# Patient Record
Sex: Female | Born: 1940
Health system: Southern US, Community
[De-identification: ages and names within clinical notes are randomized; demographics above are authoritative.]

## PROBLEM LIST (undated history)

## (undated) DIAGNOSIS — G4710 Hypersomnia, unspecified: Secondary | ICD-10-CM

## (undated) DIAGNOSIS — B2790 Infectious mononucleosis, unspecified without complication: Secondary | ICD-10-CM

## (undated) DIAGNOSIS — I10 Essential (primary) hypertension: Secondary | ICD-10-CM

## (undated) DIAGNOSIS — F1120 Opioid dependence, uncomplicated: Secondary | ICD-10-CM

## (undated) DIAGNOSIS — E669 Obesity, unspecified: Secondary | ICD-10-CM

## (undated) DIAGNOSIS — I2510 Atherosclerotic heart disease of native coronary artery without angina pectoris: Secondary | ICD-10-CM

## (undated) DIAGNOSIS — G894 Chronic pain syndrome: Secondary | ICD-10-CM

## (undated) DIAGNOSIS — R109 Unspecified abdominal pain: Secondary | ICD-10-CM

## (undated) DIAGNOSIS — W19XXXA Unspecified fall, initial encounter: Secondary | ICD-10-CM

## (undated) DIAGNOSIS — I639 Cerebral infarction, unspecified: Secondary | ICD-10-CM

## (undated) DIAGNOSIS — G4719 Other hypersomnia: Secondary | ICD-10-CM

## (undated) DIAGNOSIS — M25519 Pain in unspecified shoulder: Secondary | ICD-10-CM

## (undated) DIAGNOSIS — E785 Hyperlipidemia, unspecified: Secondary | ICD-10-CM

## (undated) HISTORY — DX: Hypersomnia, unspecified: G47.10

## (undated) HISTORY — DX: Cerebral infarction, unspecified: I63.9

## (undated) HISTORY — DX: Atherosclerotic heart disease of native coronary artery without angina pectoris: I25.10

## (undated) HISTORY — PX: BACK SURGERY: SHX140

## (undated) HISTORY — DX: Other hypersomnia: G47.19

## (undated) HISTORY — DX: Chronic pain syndrome: G89.4

## (undated) HISTORY — DX: Unspecified fall, initial encounter: W19.XXXA

## (undated) HISTORY — DX: Unspecified abdominal pain: R10.9

## (undated) HISTORY — DX: Pain in unspecified shoulder: M25.519

## (undated) HISTORY — DX: Opioid dependence, uncomplicated: F11.20

## (undated) HISTORY — DX: Obesity, unspecified: E66.9

## (undated) HISTORY — PX: NEPHRECTOMY: SHX65

## (undated) HISTORY — DX: Hyperlipidemia, unspecified: E78.5

---

## 1986-11-25 DIAGNOSIS — B2790 Infectious mononucleosis, unspecified without complication: Secondary | ICD-10-CM

## 1986-11-25 HISTORY — DX: Infectious mononucleosis, unspecified without complication: B27.90

## 1999-01-16 ENCOUNTER — Encounter: Admission: RE | Admit: 1999-01-16 | Discharge: 1999-04-16 | Payer: Self-pay | Admitting: Family Medicine

## 1999-07-04 ENCOUNTER — Encounter: Payer: Self-pay | Admitting: Family Medicine

## 1999-07-04 ENCOUNTER — Ambulatory Visit (HOSPITAL_COMMUNITY): Admission: RE | Admit: 1999-07-04 | Discharge: 1999-07-04 | Payer: Self-pay | Admitting: Family Medicine

## 1999-08-31 ENCOUNTER — Ambulatory Visit (HOSPITAL_COMMUNITY): Admission: RE | Admit: 1999-08-31 | Discharge: 1999-08-31 | Payer: Self-pay | Admitting: Cardiology

## 2002-02-27 ENCOUNTER — Inpatient Hospital Stay (HOSPITAL_COMMUNITY): Admission: EM | Admit: 2002-02-27 | Discharge: 2002-03-02 | Payer: Self-pay

## 2002-03-01 HISTORY — PX: ANGIOPLASTY: SHX39

## 2002-03-11 ENCOUNTER — Encounter: Payer: Self-pay | Admitting: Cardiology

## 2002-03-11 ENCOUNTER — Inpatient Hospital Stay (HOSPITAL_COMMUNITY): Admission: EM | Admit: 2002-03-11 | Discharge: 2002-03-12 | Payer: Self-pay | Admitting: Emergency Medicine

## 2002-03-11 ENCOUNTER — Encounter: Payer: Self-pay | Admitting: Emergency Medicine

## 2003-02-09 ENCOUNTER — Emergency Department (HOSPITAL_COMMUNITY): Admission: EM | Admit: 2003-02-09 | Discharge: 2003-02-09 | Payer: Self-pay

## 2003-03-30 ENCOUNTER — Encounter: Admission: RE | Admit: 2003-03-30 | Discharge: 2003-06-28 | Payer: Self-pay | Admitting: Family Medicine

## 2003-07-06 ENCOUNTER — Encounter: Admission: RE | Admit: 2003-07-06 | Discharge: 2003-10-04 | Payer: Self-pay | Admitting: Family Medicine

## 2004-02-17 ENCOUNTER — Emergency Department (HOSPITAL_COMMUNITY): Admission: EM | Admit: 2004-02-17 | Discharge: 2004-02-17 | Payer: Self-pay | Admitting: *Deleted

## 2004-08-17 ENCOUNTER — Ambulatory Visit (HOSPITAL_COMMUNITY): Admission: RE | Admit: 2004-08-17 | Discharge: 2004-08-17 | Payer: Self-pay | Admitting: Neurology

## 2005-03-15 ENCOUNTER — Observation Stay (HOSPITAL_COMMUNITY): Admission: EM | Admit: 2005-03-15 | Discharge: 2005-03-16 | Payer: Self-pay | Admitting: Emergency Medicine

## 2005-03-15 HISTORY — PX: CORONARY STENT PLACEMENT: SHX1402

## 2007-01-23 ENCOUNTER — Inpatient Hospital Stay (HOSPITAL_COMMUNITY): Admission: EM | Admit: 2007-01-23 | Discharge: 2007-01-27 | Payer: Self-pay | Admitting: Emergency Medicine

## 2007-01-26 HISTORY — PX: CARDIAC CATHETERIZATION: SHX172

## 2008-09-03 ENCOUNTER — Emergency Department (HOSPITAL_COMMUNITY): Admission: EM | Admit: 2008-09-03 | Discharge: 2008-09-03 | Payer: Self-pay | Admitting: Emergency Medicine

## 2009-10-22 ENCOUNTER — Inpatient Hospital Stay (HOSPITAL_COMMUNITY): Admission: EM | Admit: 2009-10-22 | Discharge: 2009-10-23 | Payer: Self-pay | Admitting: Emergency Medicine

## 2009-10-23 HISTORY — PX: CARDIAC CATHETERIZATION: SHX172

## 2010-11-20 ENCOUNTER — Inpatient Hospital Stay (HOSPITAL_COMMUNITY)
Admission: EM | Admit: 2010-11-20 | Discharge: 2010-11-21 | Payer: Self-pay | Source: Home / Self Care | Attending: Internal Medicine | Admitting: Internal Medicine

## 2011-02-04 LAB — CBC
HCT: 37 % (ref 36.0–46.0)
HCT: 37.4 % (ref 36.0–46.0)
MCH: 28.7 pg (ref 26.0–34.0)
MCHC: 34.2 g/dL (ref 30.0–36.0)
Platelets: 231 10*3/uL (ref 150–400)
Platelets: 248 10*3/uL (ref 150–400)
RBC: 4.25 MIL/uL (ref 3.87–5.11)
RDW: 12.6 % (ref 11.5–15.5)
RDW: 13 % (ref 11.5–15.5)
WBC: 6 10*3/uL (ref 4.0–10.5)
WBC: 6.6 10*3/uL (ref 4.0–10.5)

## 2011-02-04 LAB — URINALYSIS, ROUTINE W REFLEX MICROSCOPIC
Ketones, ur: NEGATIVE mg/dL
Leukocytes, UA: NEGATIVE
Nitrite: NEGATIVE
Protein, ur: NEGATIVE mg/dL
pH: 6 (ref 5.0–8.0)

## 2011-02-04 LAB — POCT CARDIAC MARKERS
CKMB, poc: <1 ng/mL — ABNORMAL LOW (ref 1.0–8.0)
Troponin i, poc: <0.05 ng/mL (ref 0.00–0.09)

## 2011-02-04 LAB — LIPID PANEL
HDL: 56 mg/dL (ref >39)
Total CHOL/HDL Ratio: 4.6 RATIO
VLDL: 25 mg/dL (ref 0–40)

## 2011-02-04 LAB — TROPONIN I: Troponin I: 0.02 ng/mL (ref 0.00–0.06)

## 2011-02-04 LAB — LIPASE, BLOOD: Lipase: 37 U/L (ref 11–59)

## 2011-02-04 LAB — CARDIAC PANEL(CRET KIN+CKTOT+MB+TROPI)
Relative Index: INVALID (ref 0.0–2.5)
Total CK: 31 U/L (ref 7–177)
Troponin I: 0.01 ng/mL (ref 0.00–0.06)
Troponin I: 0.03 ng/mL (ref 0.00–0.06)

## 2011-02-04 LAB — COMPREHENSIVE METABOLIC PANEL
AST: 20 U/L (ref 0–37)
Alkaline Phosphatase: 73 U/L (ref 39–117)
BUN: 19 mg/dL (ref 6–23)
CO2: 24 mEq/L (ref 19–32)
GFR calc Af Amer: >60 mL/min (ref >60)
GFR calc non Af Amer: 50 mL/min — ABNORMAL LOW (ref >60)
Total Protein: 6 g/dL (ref 6.0–8.3)

## 2011-02-04 LAB — APTT: aPTT: 28 seconds (ref 24–37)

## 2011-02-04 LAB — PROTIME-INR: INR: 0.89 (ref 0.00–1.49)

## 2011-02-04 LAB — GLUCOSE, CAPILLARY
Glucose-Capillary: 190 mg/dL — ABNORMAL HIGH (ref 70–99)
Glucose-Capillary: 99 mg/dL (ref 70–99)

## 2011-02-04 LAB — BASIC METABOLIC PANEL
Chloride: 105 mEq/L (ref 96–112)
GFR calc Af Amer: >60 mL/min (ref >60)
Glucose, Bld: 105 mg/dL — ABNORMAL HIGH (ref 70–99)

## 2011-02-04 LAB — URINE MICROSCOPIC-ADD ON

## 2011-02-27 LAB — CARDIAC PANEL(CRET KIN+CKTOT+MB+TROPI)
CK, MB: 0.8 ng/mL (ref 0.3–4.0)
CK, MB: 0.9 ng/mL (ref 0.3–4.0)
Total CK: 41 U/L (ref 7–177)
Total CK: 63 U/L (ref 7–177)

## 2011-02-27 LAB — POCT CARDIAC MARKERS
CKMB, poc: <1 ng/mL — ABNORMAL LOW (ref 1.0–8.0)
CKMB, poc: <1 ng/mL — ABNORMAL LOW (ref 1.0–8.0)
Troponin i, poc: 0.05 ng/mL (ref 0.00–0.09)
Troponin i, poc: <0.05 ng/mL (ref 0.00–0.09)

## 2011-02-27 LAB — GLUCOSE, CAPILLARY: Glucose-Capillary: 86 mg/dL (ref 70–99)

## 2011-02-27 LAB — CBC
HCT: 36.3 % (ref 36.0–46.0)
Hemoglobin: 12.8 g/dL (ref 12.0–15.0)
MCV: 91.3 fL (ref 78.0–100.0)
Platelets: 203 10*3/uL (ref 150–400)
RBC: 4.06 MIL/uL (ref 3.87–5.11)
RDW: 12.9 % (ref 11.5–15.5)
WBC: 5 10*3/uL (ref 4.0–10.5)
WBC: 6.4 10*3/uL (ref 4.0–10.5)

## 2011-02-27 LAB — COMPREHENSIVE METABOLIC PANEL
ALT: 20 U/L (ref 0–35)
Alkaline Phosphatase: 77 U/L (ref 39–117)
CO2: 24 mEq/L (ref 19–32)
Calcium: 9 mg/dL (ref 8.4–10.5)
Chloride: 107 mEq/L (ref 96–112)
GFR calc non Af Amer: >60 mL/min (ref >60)
Glucose, Bld: 94 mg/dL (ref 70–99)
Sodium: 139 mEq/L (ref 135–145)
Total Bilirubin: 0.6 mg/dL (ref 0.3–1.2)

## 2011-02-27 LAB — DIFFERENTIAL
Lymphocytes Relative: 42 % (ref 12–46)
Monocytes Absolute: 0.6 10*3/uL (ref 0.1–1.0)
Monocytes Relative: 11 % (ref 3–12)
Neutro Abs: 2.2 10*3/uL (ref 1.7–7.7)
Neutrophils Relative %: 44 % (ref 43–77)

## 2011-02-27 LAB — LIPID PANEL
HDL: 66 mg/dL (ref >39)
LDL Cholesterol: 154 mg/dL — ABNORMAL HIGH (ref 0–99)
Triglycerides: 105 mg/dL (ref <150)

## 2011-02-27 LAB — BRAIN NATRIURETIC PEPTIDE: Pro B Natriuretic peptide (BNP): 165 pg/mL — ABNORMAL HIGH (ref 0.0–100.0)

## 2011-02-27 LAB — HEPARIN LEVEL (UNFRACTIONATED): Heparin Unfractionated: 0.37 IU/mL (ref 0.30–0.70)

## 2011-02-27 LAB — CK TOTAL AND CKMB (NOT AT ARMC): Relative Index: INVALID (ref 0.0–2.5)

## 2011-02-27 LAB — HEMOGLOBIN A1C: Hgb A1c MFr Bld: 6.1 % (ref 4.6–6.1)

## 2011-02-27 LAB — TROPONIN I: Troponin I: <0.01 ng/mL (ref 0.00–0.06)

## 2011-02-27 LAB — POCT I-STAT, CHEM 8
BUN: 18 mg/dL (ref 6–23)
Calcium, Ion: 1.2 mmol/L (ref 1.12–1.32)
Chloride: 105 mEq/L (ref 96–112)
Glucose, Bld: 125 mg/dL — ABNORMAL HIGH (ref 70–99)
HCT: 38 % (ref 36.0–46.0)
Potassium: 4.1 mEq/L (ref 3.5–5.1)

## 2011-02-27 LAB — TSH: TSH: 2.615 u[IU]/mL (ref 0.350–4.500)

## 2011-04-12 NOTE — Discharge Summary (Signed)
[place]  Patient:    [name], [name] Visit Number: [id number] MRN: [id number]          Service Type: MED Location: [id number] Attending Physician:  Ophelia Shoulder Dictated by:   Marya Fossa, P.A. Admit Date:  02/27/2002 Disc. Date: 03/02/02                             Discharge Summary  DATE OF BIRTH:  [birth year].  ADMISSION DIAGNOSES: 1. Chest pain, rule out myocardial infarction. 2. Hypertension. 3. History of coronary artery disease. 4. Elevated triglycerides. 5. Chronic fatigue syndrome.  DISCHARGE DIAGNOSES: 1. Chest pain resolved, myocardial infarction ruled out with negative    enzymes.  Status post cardiac catheterization with cutting balloon    percutaneous transluminal coronary angioplasty mid right coronary artery. 2. Hypertension. 3. History of coronary artery disease. 4. Elevated triglycerides. 5. Chronic fatigue syndrome.  HISTORY OF PRESENT ILLNESS:  The patient is a 70 year old white female patient of Madaline Savage, M.D., with a history of chronic fatigue syndrome, hypertension, and coronary artery disease.  She also has elevated triglycerides.  On and off for the last three weeks, she has had to take nitroglycerin for recurrent chest pain.  The pain occurs with and without exertion, lasting a few seconds to 20 or 30 minutes.  At times, the pain is worse with deep breathing. Sometimes the pain radiates to the neck.  She has associated shortness of breath but no PND or orthopnea.  The patient had a stress test and work-up in [place], [place], on August 12, 2001, with a cardiologist there which apparently was negative for ischemia.  However, given recurrent chest pain, we will proceed with heart catheterization to delineate coronary anatomy.  The risks and benefits of the procedure have been reviewed and the patient is willing to proceed.  The patient will be treated with Lovenox  per unstable angina protocol and nitropaste.  Will check cardiac enzymes.  PROCEDURES:  Cardiac catheterization by Dr. Chanda Busing on March 01, 2002.  COMPLICATIONS:  None.  CONSULTATIONS:  None.  HOSPITAL COURSE:  The patient was admitted to [facility] February 27, 2002, by Delrae Rend, M.D. She was started on the Lovenox protocol for unstable angina and nitropaste.  Cardiac enzymes were negative. EKG remained nonacute.  Admission CBC was within normal limits as was her BNP with the exception of a mildly elevated BUN at 25. Creatinine 0.9.  The patient remained stable over the weekend with no significant chest pain.  On March 01, 2002, the patient was taken to the cardiac catheterization lab by Dr. Elsie Lincoln.  This revealed a normal left main, LAD widely patent, circumflex widely patent, RCA with a 75% hypodense lesion in the mid portion.  EF 70% with no MR.  Dr. Elsie Lincoln proceeded with cutting balloon intervention of the RCA lesion with a 2.25 x 16 balloon, reducing a 75% lesion to 0%.  Integrelin was bolused and infused.  The patient tolerated the procedure well and had no problems with sheath pull.  On March 02, 2002, the patient continued to be stable and had no recurrent chest pain.  Dr. Elsie Lincoln felt the patient was stable for discharge to home. She had __________ bruising of the right groin but no hematoma.  Hemoglobin 13. BUN 10, creatinine 0.8.  Total cholesterol 286, HDL 77, LDL 153, and triglycerides 299.  The patient  will be started on statin.  DISCHARGE MEDICATIONS:  1. Lipitor 10 mg a day (new).  2. Aspirin 325 mg a day.  3. __________ 300 mg a day.  4. Synthroid 0.10 mg a day.  5. Hydrochlorothiazide 25 mg a day.  6. Fluoxetine 20 mg b.i.d.  7. Allegra 60 mg b.i.d.  8. Potassium 10 mEq b.i.d.  9. Nitroglycerin as needed for chest pain. 10. Atenolol 25 mg b.i.d. as needed for rapid heart rate. 11. Darvocet and diazepam as needed and as  directed.  ACTIVITY:  No strenuous activity, lifting more than five pounds or driving for two days.  DIET:  Low fat, low cholesterol, low salt diet.  WOUND CARE:  She may shower.  DISCHARGE INSTRUCTIONS:  She is asked to call the office for any problems or questions.  FOLLOW-UP:  She should keep her appointment with Dr. Elsie Lincoln for March 11, 2002. Dictated by:   Marya Fossa, P.A. Attending Physician:  Ophelia Shoulder DD:  03/02/02 TD:  03/02/02 Job: [id number]

## 2011-04-12 NOTE — Discharge Summary (Signed)
NAMEMARIYAH, [name]               ACCOUNT NO.:  [id number]   MEDICAL RECORD NO.:  [id number]          PATIENT TYPE:  INP   LOCATION:  3729                         FACILITY:  [facility]   PHYSICIAN:  Madaline Savage, M.D.DATE OF BIRTH:  [birth year]   DATE OF ADMISSION:  01/23/2007  DATE OF DISCHARGE:  01/27/2007                               DISCHARGE SUMMARY   DISCHARGE DIAGNOSES:  1. Chest pain, worrisome for unstable angina. Catheterization this      admission revealing no critical progression.  Plan is for continued      medical therapy.  2. Dyslipidemia, with suboptimal control.  3. Obesity.  4. Known coronary disease, with multiple interventions in the past.  5. New onset non-insulin-dependent diabetes, with hemoglobin A1c of      6.6, and mildly elevated glucose levels on this admission.  6. History of chronic fatigue syndrome.  7. Known coronary disease, with multiple interventions in the past.      Her last intervention was in April 2003.   HOSPITAL COURSE:  [name] is a 70 year old female who lives in  [place], [place], who is followed by Dr. Elsie Lincoln for about 20  years.  She has a long history of coronary disease and has had multiple  interventions.  Her last intervention was in April 2006.  She had an RCA  stent placed with a Mini Vision stent.  She was admitted through the  emergency room on January 23, 2007 with unstable angina.  She was  admitted to telemetry, started on heparin and nitrates, and set up for  diagnostic catheterization.  This was done January 26, 2007 by Dr. Elsie Lincoln.  The patient's enzymes were negative.  Catheterization did reveal  moderate coronary disease, with a 95% marginal branch, 75% small LAD,  60% diagonal, 60% circumflex, and patent distal RCA stent, with an EF of  70%.  Plan is for continued medical therapy.  We added an ACE inhibitor  and a nitrate.  During her hospitalization, hemoglobin A1c was checked  and was elevated, and her  sugars were also slightly elevated.  She is  obese, and we suspect she has metabolic syndrome and possibly insulin-  dependent diabetes.  Her lipids were also evaluated, and her LDL was  significantly elevated at 218, with an HDL of 42, and triglycerides of  237.  Her TSH is normal.  We wanted to increase her statin therapy and  possibly started her on an oral hypoglycemic, but the patient wants to  address this with diet and lifestyle changes.  Dr. Clarene Duke, who saw her  at discharge, agreed to do this for a couple months, and then we will  reassess her, checking her lipids, hemoglobin A1c, fasting blood sugar,  and seeing if she has managed to achieve any weight loss.  We feel the  patient can go home and follow up with Dr. Elsie Lincoln as an outpatient.   DISCHARGE MEDICATIONS:  1. Coated aspirin 81 mg a day.  2. Vytorin 10/40 daily.  3. Atenolol 50 mg at bedtime.  4. HCTZ 25 mg a day.  5. Synthroid  0.05 mg a day.  6. Allegra 180 mg a day.  7. Ritalin 20 mg a day.  8. Lisinopril 5 mg a day.  9. Imdur 30 mg a day.  10.Nitroglycerin sublingual p.r.n.  11.Prilosec OTC.  12.Prometrium daily.   LABORATORIES:  White count 7, hemoglobin 12.9, hematocrit 37.3,  platelets 256.  Sodium 140, potassium 3.8, BUN 17, creatinine 1.1.  Hemoglobin A1c is 6.6.  Lipids are noted above.  Cholesterol is 307,  triglycerides 237, HDL 42, LDL 218.  TSH is normal at 1.44.  Liver  functions are normal.  CK-MB and troponins were negative.  Amylase was  normal,  lipase normal Chest x-ray showed no acute findings.   DISPOSITION:  The patient is discharged in stable condition and will  follow up with Dr. Elsie Lincoln in a couple weeks in the office.      Abelino Derrick, P.A.    ______________________________  Madaline Savage, M.D.    Lenard Lance  D:  01/27/2007  T:  01/27/2007  Job:  [id number]

## 2011-04-12 NOTE — Discharge Summary (Signed)
[place]  Patient:    [name], [name] Visit Number: [id number] MRN: [id number]          Service Type: MED Location: 2000 2009 01 Attending Physician:  Ophelia Shoulder Dictated by:   Darcella Gasman. In[place], F.N.P.C. Admit Date:  03/11/2002 Discharge Date: 03/12/2002   CC:         Kathleen Curt. Chilton Si, MD  Cardiac Rehab   Discharge Summary  DISCHARGE DIAGNOSES: 1. Chest pain, noncardiac, probably chest wall pain secondary to #2. 2. Thoracic disk disease. 3. Chronic fatigue. 4. External otitis media. 5. Sinus bradycardia, normal for this patient. 6. Coronary artery disease with recent angioplasty. 7. Anxiety. 8. Gastroesophageal reflux disease. 9. Seasonal allergies.  CONDITION ON DISCHARGE:  Improved.  PROCEDURE:  None.  DISCHARGE MEDICATIONS:  1. Lipitor 10 mg as before.  2. Enteric-coated aspirin 325 mg as before.  3. Zantac 300 mg twice a day as before.  4. Synthroid as before.  5. Hydrochlorothiazide 25 mg daily as before.  6. Prozac 20 mg daily as before.  7. Allegra 60 mg as before.  8. Atenolol as before.  9. K-Dur as before. 10. For ear discomfort cortisporin otic suspension four drops right ear three     times a day for 10 days. 11. Darvocet-N 100 one or two every four hours as needed for pain.  DISCHARGE INSTRUCTIONS:  No strenuous activity for one week then resume regular activities.  Low fat, low salt diet.  See Dr. Abigail Miyamoto or Dr. [place] in follow-up of ear.  Follow up with Dr. Elsie Lincoln in six weeks, call for appointment.  HISTORY OF PRESENT ILLNESS:  A 70 year old white married female recent resident of [place], [place], who is moving to [place] currently, presented to the [facility] ER and admitted by Doug Sou, M.D. and Dr. Aleen Campi for rule out MI.  The patient had chest pain on presentation 10 to 12 days prior to this admission and she had a negative EKG and enzymes, but  cardiac catheterization revealed stenosis of her RCA and had cutting balloon angioplasty of the mid RCA.  Negative enzymes and good symptomatic result.  When the patient began to ambulate, she was short of breath, fatigued, complained of substernal chest pain.  She also had right ear pain, right neck pain, with radiation to her right sternum.  Some relief with sublingual nitroglycerin.  EKG was negative.  She was admitted to the rule out unit on IV nitroglycerin and Lovenox.  PAST MEDICAL HISTORY:  Very high anxiety level which is chronic.  Lumbar disk disease, status post disk surgery in 1987.  Thoracic disk disease with anterior radiation of pain.  Gastroesophageal reflux disease.  Hyperlipidemia. Seasonal allergies.  Chronic fatigue syndrome, status post right nephrectomy, treated hypothyroidism, and partial hysterectomy.  SOCIAL HISTORY: FAMILY HISTORY: REVIEW OF SYSTEMS:  See H&P.  DISCHARGE PHYSICAL EXAMINATION:  VITAL SIGNS: Blood pressure 115/47, pulse 79, respirations 20, temperature 97.3, oxygen saturation 90%.  GENERAL: Alert and oriented white female.  LUNGS: Clear.  HEART: S1 and S2 regular rate and rhythm.  EXTREMITIES: Without edema.  Ears; right TM was stable.  Good light reflex.  Mildly erythemic canal and swelling.  Left TM within normal limits. Nontender mastoids.  No cervical or submandible adenopathy.  LABORATORY DATA:  Admission hemoglobin 12.7, hematocrit 35.5, WBC 5.9, MCV 88, platelets 253, neutrophils 40, lymphs 47, monos 9, eos 3, basos 2.  Sodium 143, potassium 3.9, chloride 106, CO2 30, glucose  107, BUN 20, creatinine 0.9, and calcium 9.4.  Cardiac enzymes; CK ranged 58, 55, 53; MBs 0.8, 0.9, 1.0; troponins 0.02 to 0.01, all negative for MI.  Lipid panel; cholesterol 202, triglycerides 214, HDL 69, LDL 90.  Chest x-ray; lungs are clear, heart and mediastinal structures are normal, no active disease.  Persantine Cardiolite; stress portion was normal.   Nuclear results; no induced myocardial ischemia.  EF was 71%.  Cardiograms; These were all done on April 17; sinus rhythm, no significant change from previous tracing.  HOSPITAL COURSE:  [name] was admitted as stated on March 11, 2002, to the rule out MI unit.  Dr. Elsie Lincoln saw her the morning of April 17, and discontinued her nitroglycerin and Lovenox.  She underwent Persantine Cardiolite which was negative for ischemia.  By March 12, 2002, her greatest complaint was her earache and that morphine helped her get a good nights sleep.  She had no further chest pain. She was stable and ready for discharge home.  Chest pain was felt to be chest wall pain.  She was discharged and would follow up with Dr. Elsie Lincoln as an outpatient and will establish primary care here in [facility] and resume cardiac rehabilitation. Dictated by:   Darcella Gasman Ingold, F.N.P.C. Attending Physician:  Ophelia Shoulder DD:  04/04/02 TD:  04/06/02 Job: [id number]

## 2011-04-12 NOTE — Cardiovascular Report (Signed)
[name], [name]               ACCOUNT NO.:  [id number]   MEDICAL RECORD NO.:  [id number]          PATIENT TYPE:  INP   LOCATION:  3729                         FACILITY:  [facility]   PHYSICIAN:  Madaline Savage, M.D.DATE OF BIRTH:  [birth year]   DATE OF PROCEDURE:  01/26/2007  DATE OF DISCHARGE:                            CARDIAC CATHETERIZATION   PROCEDURES PERFORMED:  1. Selective coronary angiography by Judkins technique.  2. Retrograde left heart catheterization.  3. Left ventricular angiography.   ENTRY SITE:  Right femoral.   DYE USED:  Used Omnipaque.   CATHETERS USED:  A 6-French Judkins.   COMPLICATIONS:  None.   PATIENT PROFILE:  [name] is a 70 year old, white, married female,  resident of [name], [place] who I have followed as a  cardiology patient for close to 20 years.  Recently she has had chest  pain with exertion and with rest, and because of known coronary disease  in the past decided to come back to [facility] for further evaluation  and treatment.  Cardiac enzymes and EKGs prior to cath lab entry have  proven negative for myocardial infarction.  This case was completed  electively without any complications.   RESULTS:   PRESSURES:  The patient's left ventricular pressure was 145/8, end-  diastolic pressure 21.  Central aortic pressure 140/60, mean of 93.  No  aortic valve gradient by pullback technique.   ANGIOGRAPHIC RESULTS:  The patient's left main coronary artery is very  short.  It was not diseased.  LAD coursed to the cardiac apex and it  gave rise to both an optional diagonal branch arising proximally which  was a very large vessel, almost as large as the LAD itself, and a  smaller second diagonal branch.  The LAD contained a 60% stenosis just  after diagonal branch #2.  It was focal and hazy and the vessel was less  than 2-mm in diameter.   Diagonal branch #2 was 75% stenosed proximally, may be the culprit  lesion responsible  for the patient's current symptoms.  This vessel is  between 1.5 and 1.75-mm in diameter and possibly 40-mm in length.  I  think it is best treated medically.  I do not envision ballooning or  stenting as an option for this vessel.   The optional diagonal branch of the LAD contained luminal irregularities  throughout its course.  It was a small to medium size vessel that was  long in length and no lesions were seen.   The circumflex was a long vessel and showed a 60% eccentric stenosis in  the mid portion of this vessel.   The right coronary artery showed an acute marginal branch arising midway  down the vessel.  This mid portion of the acute marginal branch was 95%  stenosed as it was in 2003.   The distal RCA in the posterior descending portion contained a radio-  opaque stent that was patent throughout with runoff that was good, but  small in circumference because it was a small vessel.   Left ventricle showed normal contractility with ejection fraction of 70%  and no evidence of mitral regurgitation.   FINAL DIAGNOSES:  1. Mild diffuse coronary disease as described above.  2. Supernormal left ventricular systolic function.   PLAN:  Continued medical therapy.  The patient was recently diagnosed as  being diabetic.  She will need to begin an exercise program gradually,  change her dietary habits, lose weight, and follow up with her primary  caregiver in [name], [place], and will probably need at least  yearly stress tests.   The patient's hemoglobin A1c is elevated.  Her lipid study show that her  LDL cholesterol is 230, and triglycerides were extremely high.  Intensification of her medical therapy will become necessary.           ______________________________  Madaline Savage, M.D.     WHG/MEDQ  D:  01/26/2007  T:  01/26/2007  Job:  [id number]   cc:   [facility]  [facility], Medical Records

## 2011-04-12 NOTE — H&P (Signed)
NAMELOXLEY, CIBRIAN NO.:  [id number]   MEDICAL RECORD NO.:  [id number]          PATIENT TYPE:  EMS   LOCATION:  MAJO                         FACILITY:  [facility]   PHYSICIAN:  Ulyses Amor, MD DATE OF BIRTH:  02-Apr-1941   DATE OF ADMISSION:  03/15/2005  DATE OF DISCHARGE:                                HISTORY & PHYSICAL   [name] is a 70 year old white woman who was admitted to [facility] for further evaluation of chest pain.   The patient has an extensive history of coronary artery disease which dates  back to 65.  During these intervening 19 years, she has undergone a total  of seven percutaneous coronary interventions to a variety of coronary  vessels including the LAD, diagonal, and right coronary artery.  Her last  intervention was a cutting balloon angioplasty of the right coronary artery  in 2003.  There is no history of myocardial infarction, congestive heart  failure, or arrhythmia.   The patient presented to the emergency department with a three-day history  of chest pain.  The chest pain has been generally continuous and not  intermittent over this period of time.  The chest pain is described as a  pressure in the epigastrium.  It radiates somewhat to the midepigastrium.  There are no exacerbating or ameliorating factors.  It appears not to be  related to position, activity, meals, or respirations.  There is no  associated dyspnea, diaphoresis, or nausea.  She has not taken  nitroglycerin.  Again, it has been present for this three-day period  continuously at a low-grade level.   The patient believes that this chest pain is different from her previous  cardiac chest pain.   In addition to the aforementioned problems, the patient has a history of  hypertension; dyslipidemia; hypothyroidism; gastroesophageal reflux; and  chronic fatigue syndrome.   MEDICATIONS:  1.  Darvocet-N 100.  2.  Effexor.  3.  Allegra.  4.   Synthroid.  5.  Hydrochlorothiazide.  6.  Concerta ER.  7.  Prometrium.   ALLERGIES:  1.  CODEINE.  2.  DEMEROL.  3.  ERYTHROMYCIN.   The patient lives alone; she is married, but her husband works in another  part of the state.  She does not work.  She neither smokes nor drinks.   FAMILY HISTORY:  Noncontributory.   OPERATIONS:  1.  Nephrectomy due to atrophic right kidney.  2.  Spinal stenosis surgery.  3.  Partial hysterectomy.   REVIEW OF SYSTEMS:  Reveals no new problems related to her head, eyes, ears,  nose, mouth, throat, lungs, gastrointestinal system, genitourinary system,  or extremities.  There is no history of fever, chills, or weight loss.   PHYSICAL EXAMINATION:  VITAL SIGNS:  Blood pressure 145/57; pulse 82 and  regular; respirations 20; temperature 98.3.  GENERAL:  The patient was a middle-aged white woman in no discomfort.  She  was alert, oriented, appropriate, and responsive.  HEAD, EYES, , NOSE, AND MOUTH:  Normal.  NECK:  Without thyromegaly or adenopathy.  Carotid pulses were palpable  bilaterally and without bruits.  CARDIAC:  Revealed a normal S1 and S2.  There was no S3, S4, murmur, rub, or  click.  Cardiac rhythm was regular.  No chest wall tenderness was noted.  LUNGS:  Clear.  ABDOMEN:  Soft and nontender.  There was no mass, hepatosplenomegaly, bruit,  distention, rebound, guarding, or rigidity.  Bowel sounds were normal.  BREAST/PELVIC/RECTAL:  Were not performed, as they were not pertinent to the  reason for acute care hospitalization.  EXTREMITIES:  Without edema, deviation, or deformity.  Radial and dorsalis  pedal pulses were palpable bilaterally.  NEUROLOGIC:  Brief screening neurologic survey was unremarkable.   LABORATORIES:  The electrocardiogram was normal, with the exception of a  slightly prolonged PR interval.  The chest radiograph has not yet been done  at the time of this dictation.  The initial set of cardiac markers revealed   a myoglobin of 58.2, CK-MB less than 1, and troponin less than 0.08.  All  other remaining blood work was pending at the time of this dictation.   IMPRESSION:  1.  Chest pain.  Rule out unstable angina.  The patient's pain is atypical      in quality.  2.  Coronary artery disease.  Status post seven percutaneous coronary      interventions on a variety of vessels (LAD, diagonal, and right coronary      artery) since 1987.  3.  Hypertension.  4.  Dyslipidemia.  5.  Hypothyroidism.  6.  Gastroesophageal reflux.  7.  Chronic fatigue syndrome.   PLAN:  1.  Telemetry.  2.  Serial cardiac enzymes.  3.  Aspirin.  4.  Intravenous heparin.  5.  Intravenous nitroglycerin.  6.  Further measures per Dr. Elsie Lincoln.      MSC/MEDQ  D:  03/15/2005  T:  03/15/2005  Job:  [id number]   cc:   Ulyses Amor, MD  [address]  [place], [place] [address]  Fax: [phone]   Madaline Savage, M.D.  [phone] [address], [address]  [place]  [place] [address]  Fax: [phone]

## 2011-04-12 NOTE — Cardiovascular Report (Signed)
[place]  Patient:    CEOLA, PARA Visit Number: [id number] MRN: [id number]          Service Type: MED Location: [phone] Attending Physician:  Ophelia Shoulder Dictated by:   Madaline Savage, M.D. Proc. Date: 03/01/02 Admit Date:  02/27/2002   CC:         Cardiac Catheterization Laboratory   Cardiac Catheterization  PROCEDURES PERFORMED: 1. Intracoronary artery Cutting Balloon angioplasty of the mid    right coronary artery. 2. Selective coronary angiography by Judkins technique. 3. Left ventricular angiography. 4. Retrograde left heart catheterization.  COMPLICATIONS: None.  ENTRY SITE: Right femoral.  DYE USED: Omnipaque.  PATIENT PROFILE: The patient is a 70 year old, white married female who lives in [place], [place], who has had chest pain in the last three weeks that is substernal in nature, and responsive to sublingual nitroglycerin. She entered the emergency room yesterday with nitrate-responsive chest pain and has had subsequently negative cardiac enzymes and ECGs for myocardial infarction.  Past history shows that she had several LAD and diagonal angioplasties in 1987, and then had RCA interventions in the midportion of the vessel in the mid 1990s. Her last cardiac catheterization was performed August 31, 1999, at which time both her LAD, diagonal and right coronary arteries were all angiographically patent. She enters the catheterization lab on an inpatient basis to reassess coronary anatomy in view of her current symptoms.  RESULTS:  PRESSURES: The left ventricular pressure was 160/30. Central aortic pressure was 160/80 with a mean of 115. No aortic valve gradient by pullback technique.  ANGIOGRAPHIC RESULTS: The patients left main coronary artery appears normal.  The left anterior descending coronary artery is proximally calcified and fairly tortuous in the distal one-half of the runoff.  There is linear calcification noted in the proximal circumflex and the proximal LAD and in the first diagonal branch of the LAD. There is no obstructive coronary disease, however, including the old angioplasty sites.  The circumflex coronary artery gives rise to one main obtuse marginal branch and a bifurcating posterolateral branch distally. The proximal and mid circumflex appears normal.  The RCA is a 2.25 to 2.5 vessel. It is not calcified. There is an acute marginal branch, which arises relatively high along the mid RCA wall. There is a point of tortuosity followed by a hypodense lesion of 75% in the mid RCA well before the acute margin of the heart. The distal vessel appears normal. This 75% lesion is the only lesion seen. It represents a probable re-stenosis of an old angioplasty site.  The left ventricle shows vigorous contractility of all wall segments and a left ventricular ejection fraction between 65 and 70% with catheter-induced mitral regurgitation, which is not felt to be hemodynamically significant.  PERCUTANEOUS CORONARY INTERVENTION: This was performed using a 6 [place], right Judkins guide catheter with a 4 cm tip and side holes indwelling. The guide wire was a short Patriot wire and the only interventional device used during the procedure was a 2.25 x 6 mm Cutting Balloon device. The guide catheter provided excellent backup support and obviously no damping of pressure because of side holes. The guide wire easily crossed the lesion and was brought to rest in the PDA. The Cutting Balloon was driven distally beyond the lesion and then the lesion was visualized and the balloon was brought back to the lesions, centered and then inflated to 8 atmospheres for a minute, 15. The lesion was reduced from  75% to 0% residual. The patient did develop discomfort during the last 15-20 seconds of balloon inflation that replicated her pain almost exactly. The pain resolved within 20  seconds of balloon deflation. No ST segment changes occurred. The case was tolerated well without complications.  FINAL DIAGNOSES: 1. Two-vessel coronary artery disease, status post left anterior descending    and right coronary artery angioplasties in 1987 and in 1995. 2. Recent episodes of unstable nitrate-responsive chest pain. 3. Normal left ventricular systolic function, ejection fraction 65-70%. 4. Successful Cutting Balloon angioplasty of the mid right coronary artery    at an old angioplasty site with reduction of a 75% lesion to 0% residual. Dictated by:   Madaline Savage, M.D. Attending Physician:  Ophelia Shoulder DD:  03/01/02 TD:  03/02/02 Job: [id number]

## 2011-04-12 NOTE — Cardiovascular Report (Signed)
[name], MURAMOTO               ACCOUNT NO.:  [id number]   MEDICAL RECORD NO.:  [id number]          PATIENT TYPE:  OBV   LOCATION:  6526                         FACILITY:  [facility]   PHYSICIAN:  Madaline Savage, M.D.DATE OF BIRTH:  1941/06/19   DATE OF PROCEDURE:  03/15/2005  DATE OF DISCHARGE:  03/16/2005                              CARDIAC CATHETERIZATION   PROCEDURES PERFORMED:  1.  Selective coronary angiography by Judkins technique.  2.  Retrograde left heart catheterization.  3.  Left ventricular angiography.  4.  Percutaneous coronary stenting of the distal right coronary artery.   COMPLICATIONS:  None.   ENTRY SITE:  Right femoral.   DYE USED:  Omnipaque.   PATIENT PROFILE:  The patient is a 70 year old white married female with a  history of seven previous cardiac catheterization and percutaneous  interventions who presents with chest pain with negative cardiac enzymes.  She is brought to the catheter lab today electively to investigate the  etiology of her chest pain.   PRESSURES:  1.  Central aortic pressure 165/65, mean of 105.  2.  The left ventricular pressure was 165/2.  3.  End-diastolic pressure 25.   ANGIOGRAPHIC RESULTS:  The patient's right coronary artery contained a 75%  stenosis in the mid acute marginal branch. The proximal and mid RCA were  normal. The distal RCA contained a 95% eccentric 95% stenosis in the distal  vessel at the origin of where a posterolateral branch arose.   The left main coronary artery was normal. The LAD contained a 40% stenosis  just beyond the origin of a diagonal branch that was hypodense. The  remainder of the LAD distally appeared normal. The proximal LAD was mildly  calcified.   The proximal circumflex was mildly calcified. The proximal circumflex  contained a 30% stenosis just before the first obtuse marginal. The first  obtuse marginal and the distal posterolateral branch appeared normal.   The LV angiography  showed an ejection fraction of 60% with no wall motion  abnormalities. Percutaneous intervention was performed using Integrilin and  heparin to provide an ACT of 340. This was a direct stent procedure using a  6-French right Cordis side-hole guide catheter, patriot wire and direct  stenting with a 2.0 x 12 mini Vision stent. The direct stent was deployed  using 12 atmospheres of pressure then 15 atmospheres of pressure in the  lesion reducing it from 95% to 0 grams percent residual with preservation of  TIMI III flow.   Successful right coronary artery stenting of the distal RCA with reduction  95% to 0% residual. The patient was given Integrilin during the procedure  and Plavix and will be recovered and taken to the unit 6500 for Integrilin  18 hours and more Plavix tomorrow before discharge.      WHG/MEDQ  D:  03/15/2005  T:  03/16/2005  Job:  [id number]

## 2011-08-26 LAB — URINALYSIS, ROUTINE W REFLEX MICROSCOPIC
Bilirubin Urine: NEGATIVE
Glucose, UA: NEGATIVE
Hgb urine dipstick: NEGATIVE
Ketones, ur: NEGATIVE
Nitrite: NEGATIVE
Protein, ur: NEGATIVE
Specific Gravity, Urine: 1.006
Urobilinogen, UA: 0.2
pH: 6.5

## 2011-08-26 LAB — CBC
HCT: 42.8
Hemoglobin: 14.4
MCHC: 33.6
MCV: 90.3
Platelets: 263
RBC: 4.74
RDW: 12.7
WBC: 7.2

## 2011-08-26 LAB — POCT I-STAT, CHEM 8
BUN: 11
Calcium, Ion: 1.24
Chloride: 106
Creatinine, Ser: 0.7
Glucose, Bld: 114 — ABNORMAL HIGH
HCT: 44
Hemoglobin: 15
Potassium: 3.9
Sodium: 138
TCO2: 25

## 2011-08-26 LAB — URINE MICROSCOPIC-ADD ON

## 2011-08-26 LAB — DIFFERENTIAL
Basophils Absolute: 0
Basophils Relative: 1
Eosinophils Absolute: 0.1
Eosinophils Relative: 1
Lymphocytes Relative: 27
Lymphs Abs: 1.9
Monocytes Absolute: 0.5
Monocytes Relative: 7
Neutro Abs: 4.6
Neutrophils Relative %: 64

## 2011-12-09 DIAGNOSIS — G8929 Other chronic pain: Secondary | ICD-10-CM | POA: Diagnosis not present

## 2011-12-09 DIAGNOSIS — I10 Essential (primary) hypertension: Secondary | ICD-10-CM | POA: Diagnosis not present

## 2011-12-09 DIAGNOSIS — IMO0002 Reserved for concepts with insufficient information to code with codable children: Secondary | ICD-10-CM | POA: Diagnosis not present

## 2011-12-09 DIAGNOSIS — F329 Major depressive disorder, single episode, unspecified: Secondary | ICD-10-CM | POA: Diagnosis not present

## 2011-12-18 DIAGNOSIS — R5381 Other malaise: Secondary | ICD-10-CM | POA: Diagnosis not present

## 2011-12-18 DIAGNOSIS — E559 Vitamin D deficiency, unspecified: Secondary | ICD-10-CM | POA: Diagnosis not present

## 2011-12-18 DIAGNOSIS — E785 Hyperlipidemia, unspecified: Secondary | ICD-10-CM | POA: Diagnosis not present

## 2011-12-18 DIAGNOSIS — M549 Dorsalgia, unspecified: Secondary | ICD-10-CM | POA: Diagnosis not present

## 2011-12-18 DIAGNOSIS — I10 Essential (primary) hypertension: Secondary | ICD-10-CM | POA: Diagnosis not present

## 2011-12-18 DIAGNOSIS — R7301 Impaired fasting glucose: Secondary | ICD-10-CM | POA: Diagnosis not present

## 2011-12-18 DIAGNOSIS — E7800 Pure hypercholesterolemia, unspecified: Secondary | ICD-10-CM | POA: Diagnosis not present

## 2011-12-18 DIAGNOSIS — I2510 Atherosclerotic heart disease of native coronary artery without angina pectoris: Secondary | ICD-10-CM | POA: Diagnosis not present

## 2011-12-18 DIAGNOSIS — Z0000 Encounter for general adult medical examination without abnormal findings: Secondary | ICD-10-CM | POA: Diagnosis not present

## 2011-12-27 DIAGNOSIS — J029 Acute pharyngitis, unspecified: Secondary | ICD-10-CM | POA: Diagnosis not present

## 2011-12-27 DIAGNOSIS — R509 Fever, unspecified: Secondary | ICD-10-CM | POA: Diagnosis not present

## 2012-01-01 DIAGNOSIS — I10 Essential (primary) hypertension: Secondary | ICD-10-CM | POA: Diagnosis not present

## 2012-01-01 DIAGNOSIS — J069 Acute upper respiratory infection, unspecified: Secondary | ICD-10-CM | POA: Diagnosis not present

## 2012-01-01 DIAGNOSIS — F988 Other specified behavioral and emotional disorders with onset usually occurring in childhood and adolescence: Secondary | ICD-10-CM | POA: Diagnosis not present

## 2012-01-01 DIAGNOSIS — R05 Cough: Secondary | ICD-10-CM | POA: Diagnosis not present

## 2012-01-08 DIAGNOSIS — I10 Essential (primary) hypertension: Secondary | ICD-10-CM | POA: Diagnosis not present

## 2012-01-08 DIAGNOSIS — E119 Type 2 diabetes mellitus without complications: Secondary | ICD-10-CM | POA: Diagnosis not present

## 2012-02-20 DIAGNOSIS — M25569 Pain in unspecified knee: Secondary | ICD-10-CM | POA: Diagnosis not present

## 2012-02-20 DIAGNOSIS — M25519 Pain in unspecified shoulder: Secondary | ICD-10-CM | POA: Diagnosis not present

## 2012-03-11 DIAGNOSIS — M25569 Pain in unspecified knee: Secondary | ICD-10-CM | POA: Diagnosis not present

## 2012-04-06 DIAGNOSIS — I10 Essential (primary) hypertension: Secondary | ICD-10-CM | POA: Diagnosis not present

## 2012-04-06 DIAGNOSIS — R5382 Chronic fatigue, unspecified: Secondary | ICD-10-CM | POA: Diagnosis not present

## 2012-04-06 DIAGNOSIS — R42 Dizziness and giddiness: Secondary | ICD-10-CM | POA: Diagnosis not present

## 2012-04-06 DIAGNOSIS — G8929 Other chronic pain: Secondary | ICD-10-CM | POA: Diagnosis not present

## 2012-04-14 DIAGNOSIS — M545 Low back pain: Secondary | ICD-10-CM | POA: Diagnosis not present

## 2012-04-21 DIAGNOSIS — M545 Low back pain: Secondary | ICD-10-CM | POA: Diagnosis not present

## 2012-05-05 DIAGNOSIS — M25519 Pain in unspecified shoulder: Secondary | ICD-10-CM | POA: Diagnosis not present

## 2012-05-05 DIAGNOSIS — M25569 Pain in unspecified knee: Secondary | ICD-10-CM | POA: Diagnosis not present

## 2012-05-15 DIAGNOSIS — M545 Low back pain: Secondary | ICD-10-CM | POA: Diagnosis not present

## 2012-05-29 DIAGNOSIS — M545 Low back pain: Secondary | ICD-10-CM | POA: Diagnosis not present

## 2012-06-10 DIAGNOSIS — M545 Low back pain: Secondary | ICD-10-CM | POA: Diagnosis not present

## 2012-06-22 DIAGNOSIS — I10 Essential (primary) hypertension: Secondary | ICD-10-CM | POA: Diagnosis not present

## 2012-06-22 DIAGNOSIS — G8929 Other chronic pain: Secondary | ICD-10-CM | POA: Diagnosis not present

## 2012-06-22 DIAGNOSIS — R5382 Chronic fatigue, unspecified: Secondary | ICD-10-CM | POA: Diagnosis not present

## 2012-06-22 DIAGNOSIS — R42 Dizziness and giddiness: Secondary | ICD-10-CM | POA: Diagnosis not present

## 2012-07-07 DIAGNOSIS — I10 Essential (primary) hypertension: Secondary | ICD-10-CM | POA: Diagnosis not present

## 2012-07-07 DIAGNOSIS — R5381 Other malaise: Secondary | ICD-10-CM | POA: Diagnosis not present

## 2012-07-07 DIAGNOSIS — F329 Major depressive disorder, single episode, unspecified: Secondary | ICD-10-CM | POA: Diagnosis not present

## 2012-07-07 DIAGNOSIS — E785 Hyperlipidemia, unspecified: Secondary | ICD-10-CM | POA: Diagnosis not present

## 2012-07-25 ENCOUNTER — Encounter (HOSPITAL_COMMUNITY): Payer: Self-pay | Admitting: Emergency Medicine

## 2012-07-25 ENCOUNTER — Emergency Department (HOSPITAL_COMMUNITY)
Admission: EM | Admit: 2012-07-25 | Discharge: 2012-07-26 | Disposition: A | Discharge status: Home / Self Care | Payer: Medicare Other | Attending: Emergency Medicine | Admitting: Emergency Medicine

## 2012-07-25 DIAGNOSIS — R195 Other fecal abnormalities: Secondary | ICD-10-CM | POA: Diagnosis not present

## 2012-07-25 DIAGNOSIS — I10 Essential (primary) hypertension: Secondary | ICD-10-CM | POA: Diagnosis not present

## 2012-07-25 DIAGNOSIS — R5383 Other fatigue: Secondary | ICD-10-CM | POA: Diagnosis not present

## 2012-07-25 DIAGNOSIS — K644 Residual hemorrhoidal skin tags: Secondary | ICD-10-CM | POA: Diagnosis not present

## 2012-07-25 DIAGNOSIS — R079 Chest pain, unspecified: Secondary | ICD-10-CM | POA: Diagnosis not present

## 2012-07-25 DIAGNOSIS — N390 Urinary tract infection, site not specified: Secondary | ICD-10-CM | POA: Insufficient documentation

## 2012-07-25 DIAGNOSIS — R5381 Other malaise: Secondary | ICD-10-CM | POA: Diagnosis not present

## 2012-07-25 DIAGNOSIS — K649 Unspecified hemorrhoids: Secondary | ICD-10-CM

## 2012-07-25 DIAGNOSIS — R6889 Other general symptoms and signs: Secondary | ICD-10-CM | POA: Diagnosis not present

## 2012-07-25 HISTORY — DX: Essential (primary) hypertension: I10

## 2012-07-25 NOTE — ED Notes (Addendum)
Per EMS:  Pt comes from home and experienced chest pressure since this morning.  She took manual BP's today and said her pressure stayed in the 200's.  Pt took atenolol and HCTZ today with no change in BP.  Prior to EMS arrival pt stated she took 324 baby aspirin.  No nitro was administered.  Pt denies nausea, vomiting, or diaphoresis.  Pt is not in any pain at this time  Pt stated the pressure has been going on for the past three days.

## 2012-07-26 ENCOUNTER — Emergency Department (HOSPITAL_COMMUNITY): Payer: Medicare Other

## 2012-07-26 DIAGNOSIS — R079 Chest pain, unspecified: Secondary | ICD-10-CM | POA: Diagnosis not present

## 2012-07-26 DIAGNOSIS — I10 Essential (primary) hypertension: Secondary | ICD-10-CM | POA: Diagnosis not present

## 2012-07-26 LAB — CBC WITH DIFFERENTIAL/PLATELET
Basophils Absolute: 0 10*3/uL (ref 0.0–0.1)
Basophils Relative: 0 % (ref 0–1)
MCHC: 35.2 g/dL (ref 30.0–36.0)
Monocytes Absolute: 0.7 10*3/uL (ref 0.1–1.0)
Neutro Abs: 5.4 10*3/uL (ref 1.7–7.7)
Neutrophils Relative %: 60 % (ref 43–77)
Platelets: 243 10*3/uL (ref 150–400)
RDW: 12.8 % (ref 11.5–15.5)
WBC: 9.1 10*3/uL (ref 4.0–10.5)

## 2012-07-26 LAB — COMPREHENSIVE METABOLIC PANEL
ALT: 19 U/L (ref 0–35)
AST: 24 U/L (ref 0–37)
Albumin: 3.8 g/dL (ref 3.5–5.2)
Chloride: 98 mEq/L (ref 96–112)
Creatinine, Ser: 0.57 mg/dL (ref 0.50–1.10)
Potassium: 3.5 mEq/L (ref 3.5–5.1)
Sodium: 138 mEq/L (ref 135–145)
Total Bilirubin: 0.7 mg/dL (ref 0.3–1.2)

## 2012-07-26 LAB — URINALYSIS, ROUTINE W REFLEX MICROSCOPIC
Bilirubin Urine: NEGATIVE
Nitrite: NEGATIVE
Specific Gravity, Urine: 1.009 (ref 1.005–1.030)
pH: 6 (ref 5.0–8.0)

## 2012-07-26 LAB — URINE MICROSCOPIC-ADD ON

## 2012-07-26 LAB — OCCULT BLOOD, POC DEVICE: Fecal Occult Bld: POSITIVE

## 2012-07-26 LAB — POCT I-STAT TROPONIN I

## 2012-07-26 MED ORDER — GI COCKTAIL ~~LOC~~
30.0000 mL | Freq: Once | ORAL | Status: AC
  Administered 2012-07-26: 30 mL via ORAL
  Filled 2012-07-26: qty 30

## 2012-07-26 MED ORDER — ONDANSETRON HCL 4 MG/2ML IJ SOLN
4.0000 mg | Freq: Once | INTRAMUSCULAR | Status: AC
  Administered 2012-07-26: 4 mg via INTRAVENOUS
  Filled 2012-07-26: qty 2

## 2012-07-26 MED ORDER — SULFAMETHOXAZOLE-TRIMETHOPRIM 800-160 MG PO TABS: 1.0000 | ORAL_TABLET | Freq: Two times a day (BID) | ORAL | Status: AC

## 2012-07-26 MED ORDER — SULFAMETHOXAZOLE-TMP DS 800-160 MG PO TABS
1.0000 | ORAL_TABLET | Freq: Once | ORAL | Status: AC
  Administered 2012-07-26: 1 via ORAL
  Filled 2012-07-26: qty 1

## 2012-07-26 NOTE — ED Provider Notes (Addendum)
History     CSN: [id number]  Arrival date & time 07/25/12  2227   First MD Initiated Contact with Patient 07/26/12 [phone]      Chief Complaint  Patient presents with  . Chest Pain    (Consider location/radiation/quality/duration/timing/severity/associated sxs/prior treatment) Patient is a 71 y.o. female presenting with chest pain. The history is provided by the patient and the spouse.  Chest Pain Primary symptoms include fatigue. Pertinent negatives for primary symptoms include no fever, no shortness of breath, no cough, no palpitations, no abdominal pain, no nausea and no vomiting.  Associated symptoms include weakness.  Pertinent negatives for associated symptoms include no diaphoresis.   she says she has a hx of stents.  She says she did not have pain before getting stent.   She has had bilat sharp cp for a few days.  It is associated with a general feeling of being sick. She denies n/v/sob/sweating.  She denies leg pain or swelling.  She says she does not urinate as much as normal.  She denies dysuria.  She says she has had bleeding from her hemorrhoids.  She denies cp now.    Past Medical History  Diagnosis Date  . Hypertension     Past Surgical History  Procedure Date  . Angioplasty   . Coronary stent placement   . Nephrectomy     Family History  Problem Relation Age of Onset  . Hypertension Mother   . Diabetes Father     History  Substance Use Topics  . Smoking status: Never Smoker   . Smokeless tobacco: Not on file  . Alcohol Use: No    OB History    Grav Para Term Preterm Abortions TAB SAB Ect Mult Living   2 2 2       2       Review of Systems  Constitutional: Positive for fatigue. Negative for fever, chills and diaphoresis.  HENT: Negative for neck pain.   Eyes: Negative for visual disturbance.  Respiratory: Negative for cough, chest tightness and shortness of breath.   Cardiovascular: Positive for chest pain. Negative for palpitations and leg  swelling.       Fleeting intermittent, sharp, bilateral chest pain  Gastrointestinal: Positive for blood in stool. Negative for nausea, vomiting and abdominal pain.  Genitourinary: Negative for dysuria.  Musculoskeletal: Negative for back pain.  Skin: Negative for rash.  Neurological: Positive for weakness. Negative for headaches.  Psychiatric/Behavioral: Negative for confusion.  All other systems reviewed and are negative.    Allergies  Cardizem; Codeine; Demerol; Erythromycin; Inderal; and Zoloft  Home Medications   Current Outpatient Rx  Name Route Sig Dispense Refill  . ATENOLOL 25 MG PO TABS Oral Take 25 mg by mouth daily.    Marland Kitchen ESCITALOPRAM OXALATE 20 MG PO TABS Oral Take 10 mg by mouth daily.    Marland Kitchen FEXOFENADINE HCL 180 MG PO TABS Oral Take 180 mg by mouth daily.    Marland Kitchen HYDROCHLOROTHIAZIDE 12.5 MG PO CAPS Oral Take 12.5 mg by mouth daily.    . IBUPROFEN 200 MG PO TABS Oral Take 200 mg by mouth every 6 (six) hours as needed. For pain    . POTASSIUM CHLORIDE CRYS ER 10 MEQ PO TBCR Oral Take 10 mEq by mouth daily.    . TRAMADOL HCL 50 MG PO TABS Oral Take 50 mg by mouth every 6 (six) hours as needed. For pain      BP 154/56  Pulse 66  Temp 98.2 F (  36.8 C) (Oral)  Resp 14  SpO2 96%  Physical Exam  Vitals reviewed. Constitutional: She is oriented to person, place, and time. She appears well-developed and well-nourished. No distress.  HENT:  Head: Normocephalic and atraumatic.  Eyes: Conjunctivae and EOM are normal.  Neck: Normal range of motion. Neck supple.  Cardiovascular: Normal rate, regular rhythm and intact distal pulses.   No murmur heard. Pulmonary/Chest: Effort normal and breath sounds normal. No respiratory distress.  Abdominal: Soft. Bowel sounds are normal. She exhibits no distension. There is no tenderness.  Genitourinary: Guaiac positive stool.       Rectal done with rn chaperone. + ext. Hemorrhoids.  Musculoskeletal: Normal range of motion. She exhibits  no edema.  Neurological: She is alert and oriented to person, place, and time.  Skin: Skin is warm and dry.  Psychiatric: She has a normal mood and affect. Thought content normal.    ED Course  Procedures (including critical care time)  Labs Reviewed  COMPREHENSIVE METABOLIC PANEL - Abnormal; Notable for the following:    Glucose, Bld 126 (*)     All other components within normal limits  URINALYSIS, ROUTINE W REFLEX MICROSCOPIC - Abnormal; Notable for the following:    APPearance CLOUDY (*)     Hgb urine dipstick TRACE (*)     Ketones, ur 15 (*)     Leukocytes, UA LARGE (*)     All other components within normal limits  URINE MICROSCOPIC-ADD ON - Abnormal; Notable for the following:    Squamous Epithelial / LPF MANY (*)     Bacteria, UA FEW (*)     All other components within normal limits  CBC WITH DIFFERENTIAL  POCT I-STAT TROPONIN I  OCCULT BLOOD, POC DEVICE  URINE CULTURE   Dg Chest 2 View  07/26/2012  *RADIOLOGY REPORT*  Clinical Data: Chest pain.  High blood pressure  CHEST - 2 VIEW  Comparison: None  Findings: The heart size and mediastinal contours are within normal limits.  Both lungs are clear.  The visualized skeletal structures are unremarkable.  IMPRESSION: Negative examination.   Original Report Authenticated By: Rosealee Albee, M.D.      No diagnosis found.    Rate: 66  Rhythm: normal sinus rhythm  QRS Axis: normal  Intervals: normal  ST/T Wave abnormalities: normal  Conduction Disutrbances: none  Narrative Interpretation: unremarkable     MDM  uti No signs acs or pulm dz.   No evidence hypotension or severe illness.  Hemorrhoids - with bleeding.  Not thrombosed.        Cheri Guppy, MD 07/26/12 0700  Cheri Guppy, MD 08/13/12 [phone]

## 2012-07-26 NOTE — ED Notes (Signed)
Pt discharged home. No further questions at the time.

## 2012-07-27 LAB — URINE CULTURE

## 2012-08-12 DIAGNOSIS — M545 Low back pain: Secondary | ICD-10-CM | POA: Diagnosis not present

## 2012-08-13 DIAGNOSIS — M25569 Pain in unspecified knee: Secondary | ICD-10-CM | POA: Diagnosis not present

## 2012-08-13 DIAGNOSIS — M25519 Pain in unspecified shoulder: Secondary | ICD-10-CM | POA: Diagnosis not present

## 2012-08-17 DIAGNOSIS — I2510 Atherosclerotic heart disease of native coronary artery without angina pectoris: Secondary | ICD-10-CM | POA: Diagnosis not present

## 2012-08-17 DIAGNOSIS — I10 Essential (primary) hypertension: Secondary | ICD-10-CM | POA: Diagnosis not present

## 2012-09-03 DIAGNOSIS — M25569 Pain in unspecified knee: Secondary | ICD-10-CM | POA: Diagnosis not present

## 2012-09-18 DIAGNOSIS — I10 Essential (primary) hypertension: Secondary | ICD-10-CM | POA: Diagnosis not present

## 2012-09-18 DIAGNOSIS — I2510 Atherosclerotic heart disease of native coronary artery without angina pectoris: Secondary | ICD-10-CM | POA: Diagnosis not present

## 2012-09-18 DIAGNOSIS — R5381 Other malaise: Secondary | ICD-10-CM | POA: Diagnosis not present

## 2012-10-07 DIAGNOSIS — Z79899 Other long term (current) drug therapy: Secondary | ICD-10-CM | POA: Diagnosis not present

## 2012-10-07 DIAGNOSIS — I2510 Atherosclerotic heart disease of native coronary artery without angina pectoris: Secondary | ICD-10-CM | POA: Diagnosis not present

## 2012-10-07 DIAGNOSIS — R5383 Other fatigue: Secondary | ICD-10-CM | POA: Diagnosis not present

## 2012-10-07 DIAGNOSIS — I10 Essential (primary) hypertension: Secondary | ICD-10-CM | POA: Diagnosis not present

## 2012-10-20 DIAGNOSIS — I10 Essential (primary) hypertension: Secondary | ICD-10-CM | POA: Diagnosis not present

## 2012-10-20 DIAGNOSIS — N390 Urinary tract infection, site not specified: Secondary | ICD-10-CM | POA: Diagnosis not present

## 2012-10-20 DIAGNOSIS — R5381 Other malaise: Secondary | ICD-10-CM | POA: Diagnosis not present

## 2012-10-20 DIAGNOSIS — E038 Other specified hypothyroidism: Secondary | ICD-10-CM | POA: Diagnosis not present

## 2012-10-20 DIAGNOSIS — F411 Generalized anxiety disorder: Secondary | ICD-10-CM | POA: Diagnosis not present

## 2012-11-03 DIAGNOSIS — M25569 Pain in unspecified knee: Secondary | ICD-10-CM | POA: Diagnosis not present

## 2012-11-03 DIAGNOSIS — M25519 Pain in unspecified shoulder: Secondary | ICD-10-CM | POA: Diagnosis not present

## 2012-11-10 DIAGNOSIS — M545 Low back pain: Secondary | ICD-10-CM | POA: Diagnosis not present

## 2012-12-29 DIAGNOSIS — M25569 Pain in unspecified knee: Secondary | ICD-10-CM | POA: Diagnosis not present

## 2013-01-02 ENCOUNTER — Telehealth (HOSPITAL_COMMUNITY): Payer: Self-pay | Admitting: Emergency Medicine

## 2013-01-02 ENCOUNTER — Emergency Department (HOSPITAL_COMMUNITY)
Admission: EM | Admit: 2013-01-02 | Discharge: 2013-01-02 | Disposition: A | Discharge status: Home / Self Care | Payer: Medicare Other | Attending: Emergency Medicine | Admitting: Emergency Medicine

## 2013-01-02 ENCOUNTER — Encounter (HOSPITAL_COMMUNITY): Payer: Self-pay | Admitting: Nurse Practitioner

## 2013-01-02 DIAGNOSIS — K2990 Gastroduodenitis, unspecified, without bleeding: Secondary | ICD-10-CM | POA: Diagnosis not present

## 2013-01-02 DIAGNOSIS — Z9861 Coronary angioplasty status: Secondary | ICD-10-CM | POA: Diagnosis not present

## 2013-01-02 DIAGNOSIS — R197 Diarrhea, unspecified: Secondary | ICD-10-CM | POA: Diagnosis not present

## 2013-01-02 DIAGNOSIS — E860 Dehydration: Secondary | ICD-10-CM | POA: Diagnosis not present

## 2013-01-02 DIAGNOSIS — Z79899 Other long term (current) drug therapy: Secondary | ICD-10-CM | POA: Insufficient documentation

## 2013-01-02 DIAGNOSIS — R112 Nausea with vomiting, unspecified: Secondary | ICD-10-CM | POA: Diagnosis not present

## 2013-01-02 DIAGNOSIS — I10 Essential (primary) hypertension: Secondary | ICD-10-CM | POA: Diagnosis not present

## 2013-01-02 DIAGNOSIS — R10819 Abdominal tenderness, unspecified site: Secondary | ICD-10-CM | POA: Diagnosis not present

## 2013-01-02 LAB — COMPREHENSIVE METABOLIC PANEL
ALT: 21 U/L (ref 0–35)
AST: 22 U/L (ref 0–37)
CO2: 23 mEq/L (ref 19–32)
Calcium: 10.1 mg/dL (ref 8.4–10.5)
Chloride: 96 mEq/L (ref 96–112)
GFR calc non Af Amer: 66 mL/min — ABNORMAL LOW (ref >90)
Sodium: 134 mEq/L — ABNORMAL LOW (ref 135–145)
Total Bilirubin: 1.3 mg/dL — ABNORMAL HIGH (ref 0.3–1.2)

## 2013-01-02 LAB — URINALYSIS, ROUTINE W REFLEX MICROSCOPIC
Glucose, UA: NEGATIVE mg/dL
Leukocytes, UA: NEGATIVE
Specific Gravity, Urine: 1.022 (ref 1.005–1.030)
pH: 5.5 (ref 5.0–8.0)

## 2013-01-02 LAB — CBC
Hemoglobin: 16.2 g/dL — ABNORMAL HIGH (ref 12.0–15.0)
RBC: 5.41 MIL/uL — ABNORMAL HIGH (ref 3.87–5.11)

## 2013-01-02 MED ORDER — SODIUM CHLORIDE 0.9 % IV BOLUS (SEPSIS)
500.0000 mL | Freq: Once | INTRAVENOUS | Status: AC
  Administered 2013-01-02: 500 mL via INTRAVENOUS

## 2013-01-02 MED ORDER — ONDANSETRON HCL 4 MG/2ML IJ SOLN
4.0000 mg | Freq: Once | INTRAMUSCULAR | Status: AC
  Administered 2013-01-02: 4 mg via INTRAVENOUS
  Filled 2013-01-02: qty 2

## 2013-01-02 MED ORDER — ONDANSETRON HCL 8 MG PO TABS: 8.0000 mg | ORAL_TABLET | Freq: Three times a day (TID) | ORAL | Status: DC | PRN

## 2013-01-02 MED ORDER — MORPHINE SULFATE 4 MG/ML IJ SOLN
4.0000 mg | Freq: Once | INTRAMUSCULAR | Status: AC
  Administered 2013-01-02: 4 mg via INTRAVENOUS
  Filled 2013-01-02: qty 1

## 2013-01-02 MED ORDER — SODIUM CHLORIDE 0.9 % IV BOLUS (SEPSIS)
1000.0000 mL | Freq: Once | INTRAVENOUS | Status: AC
  Administered 2013-01-02: 1000 mL via INTRAVENOUS

## 2013-01-02 MED ORDER — DIPHENHYDRAMINE HCL 50 MG/ML IJ SOLN
25.0000 mg | Freq: Once | INTRAMUSCULAR | Status: AC
  Administered 2013-01-02: 25 mg via INTRAVENOUS
  Filled 2013-01-02: qty 1

## 2013-01-02 NOTE — ED Notes (Signed)
Per pt, n/v/d began last night at 1100; unsure of how many emesis.  Last emesis around 5AM.

## 2013-01-02 NOTE — ED Notes (Signed)
Per EMS: Pt from home. 1 vomit since 5:30. Malaise began yesterday on 01/01/13.  Oral phenergan at 7AM, so EMS held Zofran. Hx: lower back pain, cardiac hx (PVC en route).  PIV: 20g in left wrist- 500cc fluid. VS WNL per EMS.

## 2013-01-02 NOTE — ED Notes (Signed)
Pt is aware that urine sample is needed and will try in a few minutes.

## 2013-01-02 NOTE — ED Provider Notes (Signed)
History     CSN: [id number]  Arrival date & time 01/02/13  7829   First MD Initiated Contact with Patient 01/02/13 1019      Chief Complaint  Patient presents with  . Nausea  . Emesis  . Diarrhea    (Consider location/radiation/quality/duration/timing/severity/associated sxs/prior treatment) Patient is a 72 y.o. female presenting with vomiting and diarrhea. The history is provided by the patient.  Emesis Associated symptoms: diarrhea   Associated symptoms: no abdominal pain, no chills and no headaches   Diarrhea Associated symptoms: vomiting   Associated symptoms: no abdominal pain, no chills, no fever and no headaches   pt with nvd onset last pm. Several episodes of each. Diarrhea watery, not bloody. Emesis clear, not bloody or bilious. No abd distension or pain. No known ill contacts x to say was at md office w her husband this past week and pt in waiting room had a viral illness.  No bad food ingestion. No recent abx use. Denies fever or chills. No cough or uri c/o. No gu c/o. No faintness or dizziness.    Past Medical History  Diagnosis Date  . Hypertension     Past Surgical History  Procedure Laterality Date  . Angioplasty    . Coronary stent placement    . Nephrectomy    . Back surgery      Family History  Problem Relation Age of Onset  . Hypertension Mother   . Diabetes Father     History  Substance Use Topics  . Smoking status: Never Smoker   . Smokeless tobacco: Not on file  . Alcohol Use: No    OB History   Grav Para Term Preterm Abortions TAB SAB Ect Mult Living   2 2 2       2       Review of Systems  Constitutional: Negative for fever and chills.  HENT: Negative for neck pain.   Eyes: Negative for redness.  Respiratory: Negative for shortness of breath.   Cardiovascular: Negative for chest pain.  Gastrointestinal: Positive for vomiting and diarrhea. Negative for abdominal pain.  Genitourinary: Negative for flank pain.  Musculoskeletal:  Negative for back pain.  Skin: Negative for rash.  Neurological: Negative for headaches.  Hematological: Does not bruise/bleed easily.  Psychiatric/Behavioral: Negative for confusion.    Allergies  Cardizem; Codeine; Demerol; Erythromycin; Inderal; and Zoloft  Home Medications   Current Outpatient Rx  Name  Route  Sig  Dispense  Refill  . atenolol (TENORMIN) 25 MG tablet   Oral   Take 25 mg by mouth daily.         Marland Kitchen escitalopram (LEXAPRO) 20 MG tablet   Oral   Take 10 mg by mouth daily.         . fexofenadine (ALLEGRA) 180 MG tablet   Oral   Take 180 mg by mouth daily.         . hydrochlorothiazide (MICROZIDE) 12.5 MG capsule   Oral   Take 12.5 mg by mouth daily.         Marland Kitchen ibuprofen (ADVIL,MOTRIN) 200 MG tablet   Oral   Take 200 mg by mouth every 6 (six) hours as needed. For pain         . potassium chloride (K-DUR,KLOR-CON) 10 MEQ tablet   Oral   Take 10 mEq by mouth daily.         . traMADol (ULTRAM) 50 MG tablet   Oral   Take 50 mg by mouth every  6 (six) hours as needed. For pain           BP 136/68  Pulse 85  Temp(Src) 97.4 F (36.3 C) (Oral)  Resp 16  SpO2 96%  Physical Exam  Nursing note and vitals reviewed. Constitutional: She is oriented to person, place, and time. She appears well-developed and well-nourished. No distress.  HENT:  Mouth/Throat: Oropharynx is clear and moist.  Eyes: Conjunctivae are normal. No scleral icterus.  Neck: Neck supple. No tracheal deviation present.  Cardiovascular: Normal rate, regular rhythm, normal heart sounds and intact distal pulses.   Pulmonary/Chest: Effort normal and breath sounds normal. No respiratory distress.  Abdominal: Soft. Normal appearance and bowel sounds are normal. She exhibits no distension and no mass. There is no tenderness. There is no rebound and no guarding.  Musculoskeletal: She exhibits no edema and no tenderness.  Neurological: She is alert and oriented to person, place, and  time.  Skin: Skin is warm and dry. No rash noted.  Psychiatric: She has a normal mood and affect.    ED Course  Procedures (including critical care time)   Results for orders placed during the hospital encounter of 01/02/13  CBC      Result Value Range   WBC 11.3 (*) 4.0 - 10.5 K/uL   RBC 5.41 (*) 3.87 - 5.11 MIL/uL   Hemoglobin 16.2 (*) 12.0 - 15.0 g/dL   HCT 64.4 (*) 03.4 - 74.2 %   MCV 88.4  78.0 - 100.0 fL   MCH 29.9  26.0 - 34.0 pg   MCHC 33.9  30.0 - 36.0 g/dL   RDW 59.5  63.8 - 75.6 %   Platelets 275  150 - 400 K/uL  URINALYSIS, ROUTINE W REFLEX MICROSCOPIC      Result Value Range   Color, Urine YELLOW  YELLOW   APPearance CLEAR  CLEAR   Specific Gravity, Urine 1.022  1.005 - 1.030   pH 5.5  5.0 - 8.0   Glucose, UA NEGATIVE  NEGATIVE mg/dL   Hgb urine dipstick NEGATIVE  NEGATIVE   Bilirubin Urine NEGATIVE  NEGATIVE   Ketones, ur NEGATIVE  NEGATIVE mg/dL   Protein, ur NEGATIVE  NEGATIVE mg/dL   Urobilinogen, UA 0.2  0.0 - 1.0 mg/dL   Nitrite NEGATIVE  NEGATIVE   Leukocytes, UA NEGATIVE  NEGATIVE  COMPREHENSIVE METABOLIC PANEL      Result Value Range   Sodium 134 (*) 135 - 145 mEq/L   Potassium 4.0  3.5 - 5.1 mEq/L   Chloride 96  96 - 112 mEq/L   CO2 23  19 - 32 mEq/L   Glucose, Bld 249 (*) 70 - 99 mg/dL   BUN 31 (*) 6 - 23 mg/dL   Creatinine, Ser 4.33  0.50 - 1.10 mg/dL   Calcium 29.5  8.4 - 18.8 mg/dL   Total Protein 7.2  6.0 - 8.3 g/dL   Albumin 3.8  3.5 - 5.2 g/dL   AST 22  0 - 37 U/L   ALT 21  0 - 35 U/L   Alkaline Phosphatase 94  39 - 117 U/L   Total Bilirubin 1.3 (*) 0.3 - 1.2 mg/dL   GFR calc non Af Amer 66 (*) >90 mL/min   GFR calc Af Amer 76 (*) >90 mL/min      MDM  Iv ns bolus. zofran iv. Labs.  Reviewed nursing notes and prior charts for additional history.   Pt tolerating po fluids. No nv. abd soft nt.  Pt  feels much improved. Appears stable for d/c.       Suzi Roots, MD 01/02/13 [phone]

## 2013-01-02 NOTE — ED Notes (Signed)
ZOX:[id number]<AV> Expected date:01/02/13<BR> Expected time: 9:49 AM<BR> Means of arrival:Ambulance<BR> Comments:<BR> ? Norovirus, orthostatic N/V

## 2013-01-02 NOTE — ED Notes (Signed)
MD at bedside. 

## 2013-01-14 DIAGNOSIS — R5381 Other malaise: Secondary | ICD-10-CM | POA: Diagnosis not present

## 2013-01-14 DIAGNOSIS — R946 Abnormal results of thyroid function studies: Secondary | ICD-10-CM | POA: Diagnosis not present

## 2013-01-14 DIAGNOSIS — F411 Generalized anxiety disorder: Secondary | ICD-10-CM | POA: Diagnosis not present

## 2013-01-14 DIAGNOSIS — I10 Essential (primary) hypertension: Secondary | ICD-10-CM | POA: Diagnosis not present

## 2013-01-14 DIAGNOSIS — R232 Flushing: Secondary | ICD-10-CM | POA: Diagnosis not present

## 2013-01-14 DIAGNOSIS — R109 Unspecified abdominal pain: Secondary | ICD-10-CM | POA: Diagnosis not present

## 2013-01-15 ENCOUNTER — Other Ambulatory Visit: Payer: Self-pay | Admitting: Family Medicine

## 2013-01-15 DIAGNOSIS — R1011 Right upper quadrant pain: Secondary | ICD-10-CM

## 2013-01-25 ENCOUNTER — Other Ambulatory Visit: Payer: PRIVATE HEALTH INSURANCE

## 2013-01-28 ENCOUNTER — Other Ambulatory Visit: Payer: PRIVATE HEALTH INSURANCE

## 2013-01-29 ENCOUNTER — Other Ambulatory Visit: Payer: PRIVATE HEALTH INSURANCE

## 2013-02-02 ENCOUNTER — Ambulatory Visit
Admission: RE | Admit: 2013-02-02 | Discharge: 2013-02-02 | Disposition: A | Discharge status: Home / Self Care | Payer: Medicare Other | Source: Ambulatory Visit | Attending: Family Medicine | Admitting: Family Medicine

## 2013-02-02 ENCOUNTER — Other Ambulatory Visit: Payer: PRIVATE HEALTH INSURANCE

## 2013-02-02 DIAGNOSIS — R1011 Right upper quadrant pain: Secondary | ICD-10-CM

## 2013-02-02 DIAGNOSIS — K7689 Other specified diseases of liver: Secondary | ICD-10-CM | POA: Diagnosis not present

## 2013-02-03 DIAGNOSIS — M1710 Unilateral primary osteoarthritis, unspecified knee: Secondary | ICD-10-CM | POA: Diagnosis not present

## 2013-02-17 DIAGNOSIS — I10 Essential (primary) hypertension: Secondary | ICD-10-CM | POA: Diagnosis not present

## 2013-02-17 DIAGNOSIS — R5381 Other malaise: Secondary | ICD-10-CM | POA: Diagnosis not present

## 2013-03-04 DIAGNOSIS — M1710 Unilateral primary osteoarthritis, unspecified knee: Secondary | ICD-10-CM | POA: Diagnosis not present

## 2013-03-24 DIAGNOSIS — M7650 Patellar tendinitis, unspecified knee: Secondary | ICD-10-CM | POA: Diagnosis not present

## 2013-04-08 DIAGNOSIS — M545 Low back pain: Secondary | ICD-10-CM | POA: Diagnosis not present

## 2013-05-12 DIAGNOSIS — M961 Postlaminectomy syndrome, not elsewhere classified: Secondary | ICD-10-CM | POA: Diagnosis not present

## 2013-05-18 DIAGNOSIS — Z0000 Encounter for general adult medical examination without abnormal findings: Secondary | ICD-10-CM | POA: Diagnosis not present

## 2013-05-18 DIAGNOSIS — E038 Other specified hypothyroidism: Secondary | ICD-10-CM | POA: Diagnosis not present

## 2013-05-18 DIAGNOSIS — F411 Generalized anxiety disorder: Secondary | ICD-10-CM | POA: Diagnosis not present

## 2013-05-18 DIAGNOSIS — R5381 Other malaise: Secondary | ICD-10-CM | POA: Diagnosis not present

## 2013-05-21 ENCOUNTER — Telehealth (HOSPITAL_COMMUNITY): Payer: Self-pay | Admitting: Cardiovascular Disease

## 2013-05-21 NOTE — Telephone Encounter (Signed)
Returned call.  Pt stated she has been taking Bystolic for several months and it makes her nauseous.  Pt wants to know if she can go back on atenolol and if that goes well w/ losartan.  Pt informed Dr. Royann Shivers is out of the office x 2 weeks and an Extender will be notified for further instructions.  Verbalized understanding.  Pt also wanted to let Dr. Royann Shivers know that she was diagnosed w/ vasodepressor syncope at [facility].  Pt stated she knows she needs to see Dr. Salena Saner and wants to schedule an appt w/ him.  Appt scheduled for 7.23.14 at 3:30pm w/ Dr. Royann Shivers.  Pt informed Extender will be notified on Monday to review chart in Dr. Erin Hearing absence for advice.  In the meantime, pt advised to take Bystolic w/ her evening meal since she currently takes it after dinner.  Pt verbalized understanding and agreed w/ plan.  Pt will try advice over the weekend and update when RN call back on Monday.

## 2013-05-21 NOTE — Telephone Encounter (Signed)
States that she takes her Bystolic at night before bedtime and it is making her sick in the mornings.  Please advise.

## 2013-05-24 NOTE — Telephone Encounter (Signed)
Returned call and informed pt per instructions by MD/PA.  Pt verbalized understanding and agreed w/ plan.  Pt stated she doesn't feel good today.  Stated BP dropped to 120/70 over the weekend and she still feels weak and tired.  Offered appt today to be seen and pt declined.  Wanted to come in Thursday when she has another appt in the building and can do both the same day.  Appt scheduled for 7.3.14 at 2:20pm w/ L. Annie Paras, NP for evaluation.  Pt agreed to continue meds as rx'd until seen.

## 2013-05-24 NOTE — Telephone Encounter (Signed)
Message forwarded to L. Ingold, NP for further instructions.  

## 2013-05-24 NOTE — Telephone Encounter (Signed)
Pt had abd pain with other beta blockers, so the nausea is better than the severe pain.  She should continue the bystolic with the evening meal. Continue this until she sees Dr. Royann Shivers.

## 2013-05-25 ENCOUNTER — Telehealth: Payer: Self-pay | Admitting: Cardiovascular Disease

## 2013-05-25 NOTE — Telephone Encounter (Signed)
Returned call.  Left message to call back tomorrow before 4pm.  

## 2013-05-25 NOTE — Telephone Encounter (Signed)
Discussed this with pt on Friday (6.27.14) and appt scheduled for 7.3 per pt request.  Pt was offered a sooner appt and declined.

## 2013-05-25 NOTE — Telephone Encounter (Signed)
She was  Diagnosed Vasodepressor Syncope at [facility] in [place] and prescribe Atenolol...has been taking off Atenolol pcp and put her on Bystolic she is not sure she is taking the correct medicine for that diagnosis.. Please Call    Thanks

## 2013-05-27 ENCOUNTER — Telehealth: Payer: Self-pay | Admitting: *Deleted

## 2013-05-27 ENCOUNTER — Ambulatory Visit: Payer: Medicare Other | Admitting: Cardiology

## 2013-05-27 DIAGNOSIS — M1710 Unilateral primary osteoarthritis, unspecified knee: Secondary | ICD-10-CM | POA: Diagnosis not present

## 2013-05-27 MED ORDER — ATENOLOL 25 MG PO TABS: 25.0000 mg | ORAL_TABLET | Freq: Two times a day (BID) | ORAL | Status: DC

## 2013-05-27 NOTE — Telephone Encounter (Signed)
Have pt stop Bystolic and begin atenolol 25 mg BID  #60/ 4 refills.  Follow up with Dr. Royann Shivers as planned.  If abd pain returns she should make a log of when pain occurs in relation to meals.  I am not sure this will make any difference to nausea or abd pain.  Both occur no matter meds, but we can try.

## 2013-05-27 NOTE — Telephone Encounter (Signed)
Returned call and informed pt per instructions by MD/PA.  Pt verbalized understanding and agreed w/ plan.  Rx sent to pharmacy.  

## 2013-05-27 NOTE — Telephone Encounter (Signed)
**  Walk-In**  Message to call pt back r/t questions about Bystolic (see telephone note 6.27.14).   Call to pt and reminded she had an appt today.  Pt stated she had to cancel b/c she got a shot in her knee and she had to go home and put ice on it.  Pt informed appt was to review information she received from [facility] and decide on possible change in therapy.  Pt verbalized understanding and agreed w/ plan.  Appt rescheduled for 7.7.14 at 2:40pm w/ L. Annie Paras, NPSherian Rein, NP notified and advised she will restart atenolol until pt sees Dr. Salena Saner on 7.23.14.  Call to pt and left message to call back today before 4pm.

## 2013-05-31 ENCOUNTER — Ambulatory Visit: Payer: Medicare Other | Admitting: Cardiology

## 2013-06-13 ENCOUNTER — Encounter: Payer: Self-pay | Admitting: *Deleted

## 2013-06-15 ENCOUNTER — Encounter: Payer: Self-pay | Admitting: Cardiovascular Disease

## 2013-06-16 ENCOUNTER — Ambulatory Visit (INDEPENDENT_AMBULATORY_CARE_PROVIDER_SITE_OTHER): Payer: Medicare Other | Admitting: Cardiovascular Disease

## 2013-06-16 ENCOUNTER — Encounter: Payer: Self-pay | Admitting: Cardiovascular Disease

## 2013-06-16 VITALS — BP 142/76 | HR 75 | Resp 18 | Ht 63.0 in | Wt 183.9 lb

## 2013-06-16 DIAGNOSIS — E785 Hyperlipidemia, unspecified: Secondary | ICD-10-CM | POA: Diagnosis not present

## 2013-06-16 DIAGNOSIS — I10 Essential (primary) hypertension: Secondary | ICD-10-CM | POA: Diagnosis not present

## 2013-06-16 DIAGNOSIS — I2510 Atherosclerotic heart disease of native coronary artery without angina pectoris: Secondary | ICD-10-CM

## 2013-06-16 DIAGNOSIS — R55 Syncope and collapse: Secondary | ICD-10-CM

## 2013-06-16 NOTE — Patient Instructions (Addendum)
Decrease HCTZ to 1/2 tablet a day.  Increase Atenolol to 1 tablet in the morning and 2 tablet in the evening.  Your physician recommends that you schedule a follow-up appointment in: 2 months with a PA

## 2013-06-17 ENCOUNTER — Telehealth: Payer: Self-pay | Admitting: Cardiovascular Disease

## 2013-06-17 NOTE — Telephone Encounter (Signed)
Can not find the prescriptions that was written for yesterday when she was here yesterday-Please call it to [facility] [address]-Her BP is 150/73 and heart rate is 71

## 2013-06-17 NOTE — Telephone Encounter (Signed)
Dr. Royann Shivers responded and notified.  Advice:  Atenolol 25 mg BID for two more weeks and then take 25 mg in AM and 50 mg in PM.  Continue decreased dose of HCTZ at 12.5mg .  Returned call and informed pt per instructions by MD/PA.  Pt verbalized understanding and agreed w/ plan.

## 2013-06-17 NOTE — Telephone Encounter (Signed)
Page to Dr. Royann Shivers for further instructions.

## 2013-06-17 NOTE — Telephone Encounter (Signed)
Not feeling well at all this morning-thinks her blood pressure is dropping.Heart feels like it too slow.

## 2013-06-17 NOTE — Telephone Encounter (Signed)
Pt called back.  Stated she had an appt w/ her "MD" (PCP) today and had to cancel it b/c of how she is feeling.  Pt stated Dr. Salena Saner increased her atenolol yesterday at her appt. Stated she took it last night and went to bed.  When she stood to turn the air conditioner off, she felt dizzy and nauseous.  Stated she went back to bed and got up later to use the bathroom and was still dizzy.  Pt did not check BP last night and has not checked it this morning.  Asked pt to check BP now.  BP 108/83 HR 92 and 128/75 110 (pt talking when reading).  Pt c/o having nausea and dizziness today w/ "shallow" breathing.  Denied CP.  Pt advised to hold BP med and informed Dr. Royann Shivers will be notified for further instructions.  Message forwarded to Dr. Royann Shivers.

## 2013-06-17 NOTE — Telephone Encounter (Signed)
Returned call.  Left message to call back before 4pm.  

## 2013-06-18 ENCOUNTER — Other Ambulatory Visit: Payer: Self-pay | Admitting: *Deleted

## 2013-06-18 ENCOUNTER — Telehealth: Payer: Self-pay | Admitting: Cardiovascular Disease

## 2013-06-18 NOTE — Telephone Encounter (Signed)
[name] states that her medications are still not at [facility].  She states that Britta Mccreedy called her this am and stated that she was sending them to the pharmacy.

## 2013-06-18 NOTE — Telephone Encounter (Signed)
Pt called back stating that her medication has not been called in yet to [facility].

## 2013-06-18 NOTE — Telephone Encounter (Signed)
Called in atenolol 25mg   #90 w/6 refills [facility].

## 2013-06-18 NOTE — Telephone Encounter (Signed)
Pt notified Rx sent in electronically for increased dose of Atenolol

## 2013-06-20 ENCOUNTER — Encounter: Payer: Self-pay | Admitting: Cardiovascular Disease

## 2013-06-20 DIAGNOSIS — I10 Essential (primary) hypertension: Secondary | ICD-10-CM | POA: Insufficient documentation

## 2013-06-20 DIAGNOSIS — I2510 Atherosclerotic heart disease of native coronary artery without angina pectoris: Secondary | ICD-10-CM | POA: Insufficient documentation

## 2013-06-20 DIAGNOSIS — E785 Hyperlipidemia, unspecified: Secondary | ICD-10-CM | POA: Insufficient documentation

## 2013-06-20 DIAGNOSIS — R55 Syncope and collapse: Secondary | ICD-10-CM | POA: Insufficient documentation

## 2013-06-20 MED ORDER — ATENOLOL 25 MG PO TABS: 25.0000 mg | ORAL_TABLET | Freq: Two times a day (BID) | ORAL | Status: DC

## 2013-06-20 NOTE — Assessment & Plan Note (Addendum)
No symptoms of acute coronary sufficiency at this time. Risk factor modification should be the major target of treating her CAD. Remote angioplasty to LAD and RCA in 1987 and 1995; 2.0 x 12 mm mini vision stent and distal right coronary artery 2006; cardiac catheterization 2010 with a nonobstructive intermediate severity lesions in the proximal right coronary artery (60%) proximal LAD (50%) proximal ramus intermedius (50%); normal nuclear stress test December 2011

## 2013-06-20 NOTE — Progress Notes (Signed)
Patient ID: [name], female   DOB: 1941-04-20, 72 y.o.   MRN: [id number]     Reason for office visit Followup for CAD, hypertension, hyperlipidemia  The patient is not feeling well, but this is not a big change from her usual. She has a lot of problems with fatigue dizziness weakness and lightheadedness. She tried to take a hot shower recently and almost passed out. She had 2 be carried to her bed by her husband. She had a prodrome of flushing sensation of heat and nausea. Her heart was racing. Chest had numerous episodes like this throughout her lifetime. While we were reviewing these symptoms she recalled that she had a tilt table test performed more than 15 years ago in [place] and that she was advised to eat a high sodium diet.  We spent quite a bit of time reviewing the diagnosis of neurocardiogenic syncope and its treatment. Unfortunately in the interim she has developed significant systemic hypertension and a high sodium diet is not appropriate for her. The other hand we reviewed the fact that there are behavioral changes such as tilt training and avoidance of triggers that she can continue to implement. We will also make changes in her antihypertensive regimen, minimizing the use of diuretics.    Allergies  Allergen Reactions  . Cardizem (Diltiazem Hcl)     nausea  . Codeine     Feeling weird   . Coreg (Carvedilol)   . Demerol (Meperidine)     unknown  . Erythromycin     Very sick  . Inderal (Propranolol)     Made me cry  . Lisinopril Cough  . Procardia (Nifedipine)   . Wellbutrin (Bupropion)   . Zoloft (Sertraline Hcl)     Could not talk    Current Outpatient Prescriptions  Medication Sig Dispense Refill  . atenolol (TENORMIN) 25 MG tablet Take 1 tablet (25 mg total) by mouth 2 (two) times daily.  60 tablet  4  . diphenhydrAMINE (BENADRYL) 25 mg capsule Take 25 mg by mouth every 6 (six) hours as needed for allergies.      Marland Kitchen escitalopram (LEXAPRO) 20 MG tablet  Take 20 mg by mouth daily before breakfast.       . fexofenadine (ALLEGRA) 180 MG tablet Take 180 mg by mouth daily.      . hydrochlorothiazide (HYDRODIURIL) 25 MG tablet Take 25 mg by mouth daily.      Marland Kitchen ibuprofen (ADVIL,MOTRIN) 200 MG tablet Take 200 mg by mouth every 6 (six) hours as needed. For pain      . levothyroxine (SYNTHROID, LEVOTHROID) 25 MCG tablet Take 25 mcg by mouth daily before breakfast.      . lisinopril (PRINIVIL,ZESTRIL) 5 MG tablet Take 5 mg by mouth daily.      Marland Kitchen losartan (COZAAR) 100 MG tablet Take 100 mg by mouth every evening.      . methocarbamol (ROBAXIN) 500 MG tablet Take 500 mg by mouth every 6 (six) hours as needed (spasms).      . ondansetron (ZOFRAN) 8 MG tablet Take 1 tablet (8 mg total) by mouth every 8 (eight) hours as needed for nausea.  10 tablet  0  . potassium chloride (K-DUR,KLOR-CON) 10 MEQ tablet Take 10 mEq by mouth as needed (only takes if she dont eat potassium rich foods).       . promethazine (PHENERGAN) 25 MG tablet Take 12.5-25 mg by mouth every 6 (six) hours as needed for nausea.      Marland Kitchen  traMADol (ULTRAM) 50 MG tablet Take 50-100 mg by mouth every 6 (six) hours as needed for pain. For pain      . amphetamine-dextroamphetamine (ADDERALL) 20 MG tablet daily.       No current facility-administered medications for this visit.    Past Medical History  Diagnosis Date  . Hypertension   . CAD (coronary artery disease)   . Hyperlipemia   . Abdominal discomfort     "due to medication intolerance"    Past Surgical History  Procedure Laterality Date  . Angioplasty  03/01/2002    cutting balloon mid RCA  . Coronary stent placement  03/15/2005    distal RCA  . Nephrectomy    . Back surgery    . Cardiac catheterization  01/26/2007    mild diffuse CAD  . Cardiac catheterization  10/23/2009    nonobstructive CAD,60% prox RCA,50% prox LAD, 50% mid ramus    Family History  Problem Relation Age of Onset  . Hypertension Mother   . Diabetes Father       History   Social History  . Marital Status: Married    Spouse Name: N/A    Number of Children: N/A  . Years of Education: N/A   Occupational History  . Not on file.   Social History Main Topics  . Smoking status: Never Smoker   . Smokeless tobacco: Not on file  . Alcohol Use: No  . Drug Use: No  . Sexually Active: Not on file   Other Topics Concern  . Not on file   Social History Narrative  . No narrative on file    Review of systems: The patient specifically denies any chest pain at rest exertion, dyspnea at rest or with exertion, orthopnea, paroxysmal nocturnal dyspnea, syncope, palpitations, focal neurological deficits, intermittent claudication, lower extremity edema, unexplained weight gain, cough, hemoptysis or wheezing. She has a variety of aches and pains in multiple locations. She has dizziness and lightheadedness. She complains of weakness and fatigue. Fatigue dominates over shortness of breath. She has a lot of "stress issues"  PHYSICAL EXAM BP 142/76  Pulse 75  Resp 18  Ht 5\' 3"  (1.6 m)  Wt 183 lb 14.4 oz (83.416 kg)  BMI 32.58 kg/m2  General: Alert, oriented x3, no distress Head: no evidence of trauma, PERRL, EOMI, no exophtalmos or lid lag, no myxedema, no xanthelasma; normal ears, nose and oropharynx Neck: normal jugular venous pulsations and no hepatojugular reflux; brisk carotid pulses without delay and no carotid bruits Chest: clear to auscultation, no signs of consolidation by percussion or palpation, normal fremitus, symmetrical and full respiratory excursions Cardiovascular: normal position and quality of the apical impulse, regular rhythm, normal first and second heart sounds, no murmurs, rubs or gallops Abdomen: no tenderness or distention, no masses by palpation, no abnormal pulsatility or arterial bruits, normal bowel sounds, no hepatosplenomegaly Extremities: no clubbing, cyanosis or edema; 2+ radial, ulnar and brachial pulses bilaterally; 2+  right femoral, posterior tibial and dorsalis pedis pulses; 2+ left femoral, posterior tibial and dorsalis pedis pulses; no subclavian or femoral bruits Neurological: grossly nonfocal   EKG: Sinus rhythm, normal tracing   BMET    Component Value Date/Time   NA 134* 01/02/2013 1043   K 4.0 01/02/2013 1043   CL 96 01/02/2013 1043   CO2 23 01/02/2013 1043   GLUCOSE 249* 01/02/2013 1043   BUN 31* 01/02/2013 1043   CREATININE 0.86 01/02/2013 1043   CALCIUM 10.1 01/02/2013 1043   GFRNONAA 66*  01/02/2013 1043   GFRAA 76* 01/02/2013 1043     ASSESSMENT AND PLAN CAD (coronary artery disease) status post stent to distal right coronary artery 2006 No symptoms of acute coronary sufficiency at this time. Risk factor modification should be the major target of treating her CAD. Remote angioplasty to LAD and RCA in 1987 and 1995; 2.0 x 12 mm mini vision stent and distal right coronary artery 2006; cardiac catheterization 2010 with a nonobstructive intermediate severity lesions in the proximal right coronary artery (60%) proximal LAD (50%) proximal ramus intermedius (50%); normal nuclear stress test December 2011  Neurocardiogenic syncope This actually helps explain many of her complaints in the way that she is poorly tolerant of certain medications. It may play some role in her chronic fatigue syndrome, although I think depression is playing a big role there as well. Keeping his diagnosis in mind I think we should try to gradually discontinue her diuretic and replace it with higher doses of beta blocker. I have asked that she reduce the hydrochlorothiazide to 12.5 mg once a day and increase the atenolol to 25 mg in the morning and 50 mg in the afternoon. She called me later after making that change in she complained of severe dizziness and low blood pressure. I have therefore asked that she not increase the beta blocker yet. We should probably wait for 2 weeks that'll take for the hydrochlorothiazide to reach a new steady  state.  Essential hypertension Unfortunately this precludes a high sodium diet has a solution for her neurally mediated syncope. We'll try to limit the use of diuretics and increased use of beta blockers. Directly acting vasodilators might also be deleterious. Note that in the past she has tolerated Cardizem, nifedipine, carvedilol and propranolol poorly  Hyperlipidemia On statin  target LDL cholesterol less than 161, preferably less than 70  Meds ordered this encounter  Medications  . lisinopril (PRINIVIL,ZESTRIL) 5 MG tablet    Sig: Take 5 mg by mouth daily.  Marland Kitchen amphetamine-dextroamphetamine (ADDERALL) 20 MG tablet    Sig: daily.    Junious Silk, MD, [facility] [phone] office [phone] pager

## 2013-06-20 NOTE — Assessment & Plan Note (Signed)
This actually helps explain many of her complaints in the way that she is poorly tolerant of certain medications. It may play some role in her chronic fatigue syndrome, although I think depression is playing a big role there as well. Keeping his diagnosis in mind I think we should try to gradually discontinue her diuretic and replace it with higher doses of beta blocker. I have asked that she reduce the hydrochlorothiazide to 12.5 mg once a day and increase the atenolol to 25 mg in the morning and 50 mg in the afternoon. She called me later after making that change in she complained of severe dizziness and low blood pressure. I have therefore asked that she not increase the beta blocker yet. We should probably wait for 2 weeks that'll take for the hydrochlorothiazide to reach a new steady state.

## 2013-06-20 NOTE — Assessment & Plan Note (Signed)
Unfortunately this precludes a high sodium diet has a solution for her neurally mediated syncope. We'll try to limit the use of diuretics and increased use of beta blockers. Directly acting vasodilators might also be deleterious. Note that in the past she has tolerated Cardizem, nifedipine, carvedilol and propranolol poorly

## 2013-06-20 NOTE — Assessment & Plan Note (Signed)
On statin.

## 2013-06-21 ENCOUNTER — Telehealth: Payer: Self-pay | Admitting: Cardiovascular Disease

## 2013-06-21 ENCOUNTER — Telehealth: Payer: Self-pay | Admitting: Pediatrics

## 2013-06-21 MED ORDER — LISINOPRIL 5 MG PO TABS: 5.0000 mg | ORAL_TABLET | Freq: Every day | ORAL | Status: DC

## 2013-06-21 NOTE — Telephone Encounter (Signed)
Returned call.  Pt stated Britta Mccreedy was supposed to call in an Rx for Lisinopril and she did not get it.  Stated she did get the atenolol.  Stated BP was 187/92 HR ~ 67 about 7:45 this morning.  Pt asked refill be sent to [facility] on [address].  Refill(s) sent to pharmacy.   Pt also asked that Adderall be taken off her med list and that was done.  Pt also c/o back pain/pulling from vertebrae to shoulder blades.  Stated it is relieved w/ massage and heating pad.  Pt advised to see PCP r/t this as symptoms sound to be musculoskeletal.  Stated she has an appt w/ [facility] coming up for this.  Pt advised to keep her appt.  Pt verbalized understanding and agreed w/ plan.

## 2013-06-21 NOTE — Telephone Encounter (Signed)
Said you call ed in her Atenolol-but did not call in her Lisinopril 5mg -this moring she woke up with pressure in her chest and felt nasuated.

## 2013-06-24 ENCOUNTER — Telehealth: Payer: Self-pay | Admitting: Cardiovascular Disease

## 2013-06-24 NOTE — Telephone Encounter (Signed)
Returned call.  Pt stated her BP was high all throughout the day yesterday.  Stated she did take the extra dose of atenolol last night b/c her BP was still high when she went to bed.  Pt stated BP was 19?/12? Today.  Denied CP and c/o SOB.  Also c/o dizziness when standing w/ increased HR.  Stated she took lisinopril, hcz (12.5 mg) and atenolol at 7:45am.  BP  HR  @9 :16am  187/94  62 180/90  63 177/92  62  Pt wants to know if she can take an extra lisinopril.  Pt informed Dr. Salena Saner wanted her to take atenolol 25 mg (one tab in AM and two tabs in PM) and to decrease HCTZ to 12.5 mg per last OV note.  Pt informed he advised her to decrease atenolol to one BID since she was having hypotension and she should go ahead and start taking the extra dose of atenolol in the even as Dr. Salena Saner wanted her to.  Pt stated she did last night, but her BP is still not going down.  Pt wants to take an extra lisinopril and informed Extender will be notified as Dr. Salena Saner is out of the office today.  Pt verbalized understanding and agreed w/ plan.  Message forwarded to B. Leron Croak, PA-C for further instructions.

## 2013-06-24 NOTE — Telephone Encounter (Signed)
Please call BP is excessive high this morning-At one time it was 197/100 pulse rate was 68.

## 2013-06-24 NOTE — Telephone Encounter (Signed)
Returned your call.. Please call her at [phone]... Bp is still high

## 2013-06-24 NOTE — Telephone Encounter (Signed)
Pt was offered an appt for evaluation and declined.  Stated she can't get up to put on clothes.  Stated she has just enough energy to get to the bathroom and back to the bed.

## 2013-06-24 NOTE — Telephone Encounter (Signed)
Returned call.  Left message to call back before 4pm.  

## 2013-06-24 NOTE — Telephone Encounter (Signed)
Start with Increasing Atenolol to 50 mg in the AM and continue 25mg  at night.  Increasing the lisinopril may make the dizziness worse so lets not do that yet.  Avedis Bevis 11:44 AM

## 2013-06-24 NOTE — Telephone Encounter (Signed)
Returned call and informed pt per instructions by MD/PA.  Pt verbalized understanding and agreed w/ plan.  Pt agreed to monitor BP twice daily and when having symptoms.  Pt will call back to update with any changes.  ER for worsening symptoms.

## 2013-07-02 DIAGNOSIS — E039 Hypothyroidism, unspecified: Secondary | ICD-10-CM | POA: Diagnosis not present

## 2013-07-02 DIAGNOSIS — R5383 Other fatigue: Secondary | ICD-10-CM | POA: Diagnosis not present

## 2013-07-02 DIAGNOSIS — R5381 Other malaise: Secondary | ICD-10-CM | POA: Diagnosis not present

## 2013-07-02 DIAGNOSIS — F411 Generalized anxiety disorder: Secondary | ICD-10-CM | POA: Diagnosis not present

## 2013-07-02 DIAGNOSIS — G8929 Other chronic pain: Secondary | ICD-10-CM | POA: Diagnosis not present

## 2013-07-07 DIAGNOSIS — F341 Dysthymic disorder: Secondary | ICD-10-CM | POA: Diagnosis not present

## 2013-07-07 DIAGNOSIS — G8929 Other chronic pain: Secondary | ICD-10-CM | POA: Diagnosis not present

## 2013-07-07 DIAGNOSIS — R5382 Chronic fatigue, unspecified: Secondary | ICD-10-CM | POA: Diagnosis not present

## 2013-07-07 DIAGNOSIS — I10 Essential (primary) hypertension: Secondary | ICD-10-CM | POA: Diagnosis not present

## 2013-07-07 DIAGNOSIS — E039 Hypothyroidism, unspecified: Secondary | ICD-10-CM | POA: Diagnosis not present

## 2013-07-08 DIAGNOSIS — E039 Hypothyroidism, unspecified: Secondary | ICD-10-CM | POA: Diagnosis not present

## 2013-07-08 DIAGNOSIS — R5382 Chronic fatigue, unspecified: Secondary | ICD-10-CM | POA: Diagnosis not present

## 2013-07-08 DIAGNOSIS — I10 Essential (primary) hypertension: Secondary | ICD-10-CM | POA: Diagnosis not present

## 2013-07-08 DIAGNOSIS — G8929 Other chronic pain: Secondary | ICD-10-CM | POA: Diagnosis not present

## 2013-07-08 DIAGNOSIS — F341 Dysthymic disorder: Secondary | ICD-10-CM | POA: Diagnosis not present

## 2013-07-09 DIAGNOSIS — F341 Dysthymic disorder: Secondary | ICD-10-CM | POA: Diagnosis not present

## 2013-07-09 DIAGNOSIS — E039 Hypothyroidism, unspecified: Secondary | ICD-10-CM | POA: Diagnosis not present

## 2013-07-09 DIAGNOSIS — G8929 Other chronic pain: Secondary | ICD-10-CM | POA: Diagnosis not present

## 2013-07-09 DIAGNOSIS — R5382 Chronic fatigue, unspecified: Secondary | ICD-10-CM | POA: Diagnosis not present

## 2013-07-09 DIAGNOSIS — I10 Essential (primary) hypertension: Secondary | ICD-10-CM | POA: Diagnosis not present

## 2013-07-12 DIAGNOSIS — E039 Hypothyroidism, unspecified: Secondary | ICD-10-CM | POA: Diagnosis not present

## 2013-07-12 DIAGNOSIS — I10 Essential (primary) hypertension: Secondary | ICD-10-CM | POA: Diagnosis not present

## 2013-07-12 DIAGNOSIS — G8929 Other chronic pain: Secondary | ICD-10-CM | POA: Diagnosis not present

## 2013-07-12 DIAGNOSIS — R5382 Chronic fatigue, unspecified: Secondary | ICD-10-CM | POA: Diagnosis not present

## 2013-07-12 DIAGNOSIS — F341 Dysthymic disorder: Secondary | ICD-10-CM | POA: Diagnosis not present

## 2013-07-14 DIAGNOSIS — R5382 Chronic fatigue, unspecified: Secondary | ICD-10-CM | POA: Diagnosis not present

## 2013-07-14 DIAGNOSIS — F341 Dysthymic disorder: Secondary | ICD-10-CM | POA: Diagnosis not present

## 2013-07-14 DIAGNOSIS — G8929 Other chronic pain: Secondary | ICD-10-CM | POA: Diagnosis not present

## 2013-07-14 DIAGNOSIS — E039 Hypothyroidism, unspecified: Secondary | ICD-10-CM | POA: Diagnosis not present

## 2013-07-14 DIAGNOSIS — I10 Essential (primary) hypertension: Secondary | ICD-10-CM | POA: Diagnosis not present

## 2013-07-15 ENCOUNTER — Encounter (HOSPITAL_COMMUNITY): Payer: Self-pay

## 2013-07-15 ENCOUNTER — Emergency Department (HOSPITAL_COMMUNITY)
Admission: EM | Admit: 2013-07-15 | Discharge: 2013-07-15 | Disposition: A | Discharge status: Home / Self Care | Payer: Medicare Other | Attending: Emergency Medicine | Admitting: Emergency Medicine

## 2013-07-15 ENCOUNTER — Emergency Department (HOSPITAL_COMMUNITY): Payer: Medicare Other

## 2013-07-15 ENCOUNTER — Telehealth: Payer: Self-pay | Admitting: Cardiovascular Disease

## 2013-07-15 DIAGNOSIS — E162 Hypoglycemia, unspecified: Secondary | ICD-10-CM | POA: Insufficient documentation

## 2013-07-15 DIAGNOSIS — Z90711 Acquired absence of uterus with remaining cervical stump: Secondary | ICD-10-CM | POA: Insufficient documentation

## 2013-07-15 DIAGNOSIS — R110 Nausea: Secondary | ICD-10-CM | POA: Insufficient documentation

## 2013-07-15 DIAGNOSIS — I10 Essential (primary) hypertension: Secondary | ICD-10-CM | POA: Diagnosis not present

## 2013-07-15 DIAGNOSIS — N390 Urinary tract infection, site not specified: Secondary | ICD-10-CM | POA: Insufficient documentation

## 2013-07-15 DIAGNOSIS — Z9861 Coronary angioplasty status: Secondary | ICD-10-CM | POA: Diagnosis not present

## 2013-07-15 DIAGNOSIS — R5381 Other malaise: Secondary | ICD-10-CM | POA: Diagnosis not present

## 2013-07-15 DIAGNOSIS — E785 Hyperlipidemia, unspecified: Secondary | ICD-10-CM | POA: Insufficient documentation

## 2013-07-15 DIAGNOSIS — Z79899 Other long term (current) drug therapy: Secondary | ICD-10-CM | POA: Insufficient documentation

## 2013-07-15 DIAGNOSIS — I2510 Atherosclerotic heart disease of native coronary artery without angina pectoris: Secondary | ICD-10-CM | POA: Insufficient documentation

## 2013-07-15 DIAGNOSIS — R404 Transient alteration of awareness: Secondary | ICD-10-CM | POA: Diagnosis not present

## 2013-07-15 DIAGNOSIS — R739 Hyperglycemia, unspecified: Secondary | ICD-10-CM

## 2013-07-15 DIAGNOSIS — R0602 Shortness of breath: Secondary | ICD-10-CM | POA: Diagnosis not present

## 2013-07-15 DIAGNOSIS — R112 Nausea with vomiting, unspecified: Secondary | ICD-10-CM | POA: Diagnosis not present

## 2013-07-15 DIAGNOSIS — R7309 Other abnormal glucose: Secondary | ICD-10-CM | POA: Diagnosis not present

## 2013-07-15 LAB — COMPREHENSIVE METABOLIC PANEL
Alkaline Phosphatase: 89 U/L (ref 39–117)
BUN: 8 mg/dL (ref 6–23)
CO2: 27 mEq/L (ref 19–32)
GFR calc Af Amer: >90 mL/min (ref >90)
GFR calc non Af Amer: >90 mL/min (ref >90)
Glucose, Bld: 169 mg/dL — ABNORMAL HIGH (ref 70–99)
Potassium: 3.8 mEq/L (ref 3.5–5.1)
Total Protein: 6.8 g/dL (ref 6.0–8.3)

## 2013-07-15 LAB — URINALYSIS, ROUTINE W REFLEX MICROSCOPIC
Ketones, ur: NEGATIVE mg/dL
Nitrite: NEGATIVE
Protein, ur: NEGATIVE mg/dL
Urobilinogen, UA: 0.2 mg/dL (ref 0.0–1.0)
pH: 7.5 (ref 5.0–8.0)

## 2013-07-15 LAB — CBC WITH DIFFERENTIAL/PLATELET
Eosinophils Absolute: 0 10*3/uL (ref 0.0–0.7)
Eosinophils Relative: 1 % (ref 0–5)
Hemoglobin: 14 g/dL (ref 12.0–15.0)
Lymphs Abs: 1.4 10*3/uL (ref 0.7–4.0)
MCH: 29.4 pg (ref 26.0–34.0)
MCV: 87 fL (ref 78.0–100.0)
Monocytes Relative: 8 % (ref 3–12)
Neutrophils Relative %: 70 % (ref 43–77)
RBC: 4.76 MIL/uL (ref 3.87–5.11)

## 2013-07-15 LAB — POCT I-STAT TROPONIN I: Troponin i, poc: 0.01 ng/mL (ref 0.00–0.08)

## 2013-07-15 LAB — CG4 I-STAT (LACTIC ACID): Lactic Acid, Venous: 1.94 mmol/L (ref 0.5–2.2)

## 2013-07-15 LAB — URINE MICROSCOPIC-ADD ON

## 2013-07-15 MED ORDER — SODIUM CHLORIDE 0.9 % IV SOLN
1000.0000 mL | INTRAVENOUS | Status: DC
  Administered 2013-07-15: 1000 mL via INTRAVENOUS

## 2013-07-15 MED ORDER — FOSFOMYCIN TROMETHAMINE 3 G PO PACK
3.0000 g | PACK | Freq: Once | ORAL | Status: AC
  Administered 2013-07-15: 3 g via ORAL
  Filled 2013-07-15: qty 3

## 2013-07-15 MED ORDER — SODIUM CHLORIDE 0.9 % IV SOLN
1000.0000 mL | Freq: Once | INTRAVENOUS | Status: AC
  Administered 2013-07-15: 1000 mL via INTRAVENOUS

## 2013-07-15 NOTE — Telephone Encounter (Signed)
He wanted you to know she have been feeling bad all morning and her blood pressure have up.He is on his way to take her to the hospital.

## 2013-07-15 NOTE — ED Notes (Signed)
MD at bedside. 

## 2013-07-15 NOTE — ED Notes (Signed)
Pt aware of need of urine sample. Pt states she will alert [place] when she needs to urinate

## 2013-07-15 NOTE — Telephone Encounter (Signed)
Returned call and spoke w/ Jesusita Oka, pt's husband.  Stated pt's BP has been high all morning and she has been nauseous.  Stated pt's PCP said she needs to go to the hospital and they are on the way to [facility] as it is closer.  Informed Dr. Salena Saner will be notified.  Verbalized understanding.  Message forwarded to Dr. Royann Shivers [facility]).

## 2013-07-15 NOTE — ED Notes (Signed)
Family at bedside. 

## 2013-07-15 NOTE — ED Provider Notes (Signed)
CSN: [id number]     Arrival date & time 07/15/13  1444 History     First MD Initiated Contact with Patient 07/15/13 1503     Chief Complaint  Patient presents with  . Hypertension  . Nausea  . Weakness   (Consider location/radiation/quality/duration/timing/severity/associated sxs/prior Treatment) HPI Patient presents with multiple complaints. This illness seems to have begun several months ago, but over the past week the patient has had increasing generalized discomfort, nausea, generalized weakness.  No focal pain anywhere, no asymmetry of strength or sensation. She does describe mild head pressure, but denies visual changes, confusion or disorientation. There is no clear precipitant, and since onset no clear alleviating or exacerbating factors. No new medication, activity, diet, travel. Patient does have multiple medical problems, including hypertension, CAD.  Past Medical History  Diagnosis Date  . Hypertension   . CAD (coronary artery disease)   . Hyperlipemia   . Abdominal discomfort     "due to medication intolerance"   Past Surgical History  Procedure Laterality Date  . Angioplasty  03/01/2002    cutting balloon mid RCA  . Coronary stent placement  03/15/2005    distal RCA  . Nephrectomy    . Back surgery    . Cardiac catheterization  01/26/2007    mild diffuse CAD  . Cardiac catheterization  10/23/2009    nonobstructive CAD,60% prox RCA,50% prox LAD, 50% mid ramus   Family History  Problem Relation Age of Onset  . Hypertension Mother   . Diabetes Father    History  Substance Use Topics  . Smoking status: Never Smoker   . Smokeless tobacco: Not on file  . Alcohol Use: No   OB History   Grav Para Term Preterm Abortions TAB SAB Ect Mult Living   2 2 2       2      Review of Systems  Constitutional:       Per HPI, otherwise negative  HENT:       Per HPI, otherwise negative  Respiratory:       Per HPI, otherwise negative  Cardiovascular:       Per HPI,  otherwise negative  Gastrointestinal: Positive for nausea. Negative for vomiting and diarrhea.  Endocrine:       Negative aside from HPI  Genitourinary:       Neg aside from HPI   Musculoskeletal:       Per HPI, otherwise negative  Skin: Negative.   Neurological: Positive for dizziness, weakness and light-headedness. Negative for seizures, syncope, facial asymmetry, speech difficulty and numbness.    Allergies  Cardizem; Codeine; Coreg; Demerol; Erythromycin; Inderal; Lisinopril; Procardia; Wellbutrin; and Zoloft  Home Medications   Current Outpatient Rx  Name  Route  Sig  Dispense  Refill  . atenolol (TENORMIN) 25 MG tablet   Oral   Take 1 tablet (25 mg total) by mouth 2 (two) times daily. Take 1 tablet in the morning and 2 tablets in the evening   90 tablet   3   . escitalopram (LEXAPRO) 20 MG tablet   Oral   Take 20 mg by mouth daily before breakfast.          . fexofenadine (ALLEGRA) 180 MG tablet   Oral   Take 180 mg by mouth daily.         . hydrochlorothiazide (HYDRODIURIL) 25 MG tablet   Oral   Take 12.5 mg by mouth daily.          Marland Kitchen  ibuprofen (ADVIL,MOTRIN) 200 MG tablet   Oral   Take 200 mg by mouth every 6 (six) hours as needed. For pain         . levothyroxine (SYNTHROID, LEVOTHROID) 25 MCG tablet   Oral   Take 25 mcg by mouth daily before breakfast.         . lisinopril (PRINIVIL,ZESTRIL) 5 MG tablet   Oral   Take 1 tablet (5 mg total) by mouth daily.   30 tablet   3   . methocarbamol (ROBAXIN) 500 MG tablet   Oral   Take 500 mg by mouth every 6 (six) hours as needed (spasms).         . ondansetron (ZOFRAN) 8 MG tablet   Oral   Take 1 tablet (8 mg total) by mouth every 8 (eight) hours as needed for nausea.   10 tablet   0   . potassium chloride (K-DUR,KLOR-CON) 10 MEQ tablet   Oral   Take 10 mEq by mouth as needed (only takes if she dont eat potassium rich foods).          . promethazine (PHENERGAN) 25 MG tablet   Oral    Take 12.5-25 mg by mouth every 6 (six) hours as needed for nausea.         . traMADol (ULTRAM) 50 MG tablet   Oral   Take 50-100 mg by mouth every 4 (four) hours as needed for pain. For pain          BP 180/66  Pulse 70  Temp(Src) 98.4 F (36.9 C) (Oral)  Resp 17  Ht 5\' 3"  (1.6 m)  Wt 181 lb (82.101 kg)  BMI 32.07 kg/m2  SpO2 96% Physical Exam  Nursing note and vitals reviewed. Constitutional: She is oriented to person, place, and time. She appears well-developed and well-nourished. No distress.  HENT:  Head: Normocephalic and atraumatic.  Eyes: Conjunctivae and EOM are normal.  Cardiovascular: Normal rate and regular rhythm.   Pulmonary/Chest: Effort normal and breath sounds normal. No stridor. No respiratory distress.  Abdominal: She exhibits no distension.  Musculoskeletal: She exhibits no edema.  Neurological: She is alert and oriented to person, place, and time. No cranial nerve deficit.  Skin: Skin is warm and dry.  Psychiatric: She has a normal mood and affect.    ED Course   Procedures (including critical care time)  Labs Reviewed  URINE CULTURE  CBC WITH DIFFERENTIAL  COMPREHENSIVE METABOLIC PANEL  URINALYSIS, ROUTINE W REFLEX MICROSCOPIC  SEDIMENTATION RATE   No results found. No diagnosis found. Cardiac 70 sinus rhythm normal Pulse oximetry 100% room-air normal EKG has a sinus rhythm with rate 72, borderline prolonged QT, unchanged, abnormal  7:36 PM Patient resting in a darkened room.   We discussed all findings thus far, including hyperglycemia and possible UTI.  We discussed the need for further E/M as an outpatient, specifically for consideration of DM and/or poorly controled HTN - though there is no e/o end organ effects currently. The patient now notes that she has been feeling unwell for months, since a move from [place].  MDM  This patient presents with multiple complaints.  On exam she is awake and alert, oriented x3, uncomfortable  appearing, but in no distress.  Patient does have mild hypertension.  Absent any complaints of chest pain, dyspnea, with negative troponin, nonischemic EKG, there is low suspicion for occult ACS, or significant dysrhythmia. Patient has no evidence of significant infection beyond possible urinary tract infection,  for which she received antibiotics.  No significant skeletal abnormalities beyond hyperglycemia.  Patient was made aware of this, and the need for further evaluation, specifically A1c testing, and consideration of anti-hyperglycemics. Absent distress, with stable vital signs, with no evidence of endorgan effects and the patient was prepared for discharge with further evaluation and management as an outpatient.  I discussed specific followup instructions with her prior to discharge.  Gerhard Munch, MD 07/15/13 1940

## 2013-07-15 NOTE — ED Notes (Signed)
Bed: ZO10 Expected date:  Expected time:  Means of arrival:  Comments: ems- 72 yo

## 2013-07-15 NOTE — ED Notes (Signed)
Per EMS pt presents with NAD- generalized weakness x 2weeks NEG for STROKE. GCS 15 PEERL HTN BP170/86. worse in AM. Nausea without vomiting. Denies CP and SOB.

## 2013-07-16 ENCOUNTER — Telehealth: Payer: Self-pay | Admitting: Cardiovascular Disease

## 2013-07-16 LAB — URINE CULTURE: Culture: NO GROWTH

## 2013-07-16 LAB — GLUCOSE, CAPILLARY: Glucose-Capillary: 150 mg/dL — ABNORMAL HIGH (ref 70–99)

## 2013-07-16 NOTE — Telephone Encounter (Signed)
Yes she can take atenolol 50 mg BID and we will discuss hospitalization at her appt.

## 2013-07-16 NOTE — Telephone Encounter (Signed)
Returned call and informed pt per instructions by MD/PA.  Pt verbalized understanding and agreed w/ plan.  

## 2013-07-16 NOTE — Telephone Encounter (Signed)
Returned call.  Pt stated the doctor at ER wanted her to let Dr. Salena Saner know about her BP.  Stated it was 195/95.  Pt asking for Dr. Salena Saner to put her in the hospital to get it under control b/c she feels so bad.  Pt informed Dr. Salena Saner will be notified, but ER note stated she was to f/u outpatient and appt can be scheduled.  Pt verbalized understanding and asked for an afternoon appt.  Appt scheduled w/ Dr. Salena Saner on 8.27.14 at 4:30pm w/ Dr. Royann Shivers.  Pt asked if she can take extra BP med at night.  Pt informed RN cannot advise that and Dr. Salena Saner will be notified.  Pt verbalized understanding and agreed w/ plan.  Pt currently taking Atenolol 50 mg in AM and 25 mg in PM.  Also stated she takes lisinopril in AM.  Message forwarded to Dr. Royann Shivers.

## 2013-07-16 NOTE — Telephone Encounter (Signed)
She is so sick-BP is 195/95-She wants Dr C to admit her to the hospitall so he can get her condition under control.Went to ER yesterday,but they did not keep her.

## 2013-07-16 NOTE — Telephone Encounter (Signed)
Patient seen in ER yesterday with elevated bp 200/101.  Dr. Jeraldine Loots did labs, etc and told her to call Dr. Salena Saner about her BP meds.  Takes Atinolol and Lisinopril.  Was told she might need time replaced.  She still has some Bystolic that she's not taking.  Feeling really bad today.  Has Urinary tract infection and elevated blood sugars as well.Marland Kitchen

## 2013-07-17 ENCOUNTER — Other Ambulatory Visit: Payer: Self-pay

## 2013-07-17 ENCOUNTER — Encounter (HOSPITAL_COMMUNITY): Payer: Self-pay | Admitting: *Deleted

## 2013-07-17 ENCOUNTER — Emergency Department (HOSPITAL_COMMUNITY)
Admission: EM | Admit: 2013-07-17 | Discharge: 2013-07-17 | Disposition: A | Discharge status: Home / Self Care | Payer: Medicare Other | Attending: Emergency Medicine | Admitting: Emergency Medicine

## 2013-07-17 DIAGNOSIS — R5381 Other malaise: Secondary | ICD-10-CM | POA: Diagnosis not present

## 2013-07-17 DIAGNOSIS — I2510 Atherosclerotic heart disease of native coronary artery without angina pectoris: Secondary | ICD-10-CM | POA: Insufficient documentation

## 2013-07-17 DIAGNOSIS — R1110 Vomiting, unspecified: Secondary | ICD-10-CM | POA: Diagnosis not present

## 2013-07-17 DIAGNOSIS — Z9861 Coronary angioplasty status: Secondary | ICD-10-CM | POA: Diagnosis not present

## 2013-07-17 DIAGNOSIS — Z79899 Other long term (current) drug therapy: Secondary | ICD-10-CM | POA: Insufficient documentation

## 2013-07-17 DIAGNOSIS — I10 Essential (primary) hypertension: Secondary | ICD-10-CM | POA: Diagnosis not present

## 2013-07-17 DIAGNOSIS — R110 Nausea: Secondary | ICD-10-CM | POA: Diagnosis not present

## 2013-07-17 DIAGNOSIS — R739 Hyperglycemia, unspecified: Secondary | ICD-10-CM

## 2013-07-17 DIAGNOSIS — Z9889 Other specified postprocedural states: Secondary | ICD-10-CM | POA: Diagnosis not present

## 2013-07-17 DIAGNOSIS — R7309 Other abnormal glucose: Secondary | ICD-10-CM | POA: Diagnosis not present

## 2013-07-17 DIAGNOSIS — R197 Diarrhea, unspecified: Secondary | ICD-10-CM | POA: Diagnosis not present

## 2013-07-17 DIAGNOSIS — R5383 Other fatigue: Secondary | ICD-10-CM

## 2013-07-17 DIAGNOSIS — E785 Hyperlipidemia, unspecified: Secondary | ICD-10-CM | POA: Insufficient documentation

## 2013-07-17 DIAGNOSIS — R0602 Shortness of breath: Secondary | ICD-10-CM | POA: Diagnosis not present

## 2013-07-17 DIAGNOSIS — R7301 Impaired fasting glucose: Secondary | ICD-10-CM | POA: Diagnosis not present

## 2013-07-17 LAB — COMPREHENSIVE METABOLIC PANEL
BUN: 7 mg/dL (ref 6–23)
CO2: 25 mEq/L (ref 19–32)
Calcium: 9.5 mg/dL (ref 8.4–10.5)
Chloride: 99 mEq/L (ref 96–112)
Creatinine, Ser: 0.61 mg/dL (ref 0.50–1.10)
GFR calc Af Amer: >90 mL/min (ref >90)
GFR calc non Af Amer: 88 mL/min — ABNORMAL LOW (ref >90)
Total Bilirubin: 0.7 mg/dL (ref 0.3–1.2)

## 2013-07-17 LAB — CBC WITH DIFFERENTIAL/PLATELET
Eosinophils Relative: 1 % (ref 0–5)
HCT: 39.9 % (ref 36.0–46.0)
Hemoglobin: 13.7 g/dL (ref 12.0–15.0)
Lymphocytes Relative: 28 % (ref 12–46)
MCHC: 34.3 g/dL (ref 30.0–36.0)
MCV: 85.3 fL (ref 78.0–100.0)
Monocytes Absolute: 0.6 10*3/uL (ref 0.1–1.0)
Monocytes Relative: 9 % (ref 3–12)
Neutro Abs: 4.3 10*3/uL (ref 1.7–7.7)

## 2013-07-17 LAB — POCT I-STAT TROPONIN I: Troponin i, poc: 0 ng/mL (ref 0.00–0.08)

## 2013-07-17 MED ORDER — DIAZEPAM 2 MG PO TABS
2.0000 mg | ORAL_TABLET | Freq: Once | ORAL | Status: AC
  Administered 2013-07-17: 2 mg via ORAL
  Filled 2013-07-17: qty 1

## 2013-07-17 MED ORDER — ONDANSETRON HCL 8 MG PO TABS: 8.0000 mg | ORAL_TABLET | Freq: Two times a day (BID) | ORAL | Status: DC | PRN

## 2013-07-17 MED ORDER — LISINOPRIL 10 MG PO TABS
10.0000 mg | ORAL_TABLET | Freq: Once | ORAL | Status: AC
  Administered 2013-07-17: 10 mg via ORAL
  Filled 2013-07-17: qty 1

## 2013-07-17 MED ORDER — SODIUM CHLORIDE 0.9 % IV BOLUS (SEPSIS)
500.0000 mL | Freq: Once | INTRAVENOUS | Status: AC
  Administered 2013-07-17: 500 mL via INTRAVENOUS

## 2013-07-17 MED ORDER — LISINOPRIL 20 MG PO TABS: 20.0000 mg | ORAL_TABLET | Freq: Every day | ORAL | Status: DC

## 2013-07-17 MED ORDER — ONDANSETRON HCL 4 MG/2ML IJ SOLN
4.0000 mg | Freq: Once | INTRAMUSCULAR | Status: AC
  Administered 2013-07-17: 4 mg via INTRAVENOUS
  Filled 2013-07-17: qty 2

## 2013-07-17 NOTE — ED Notes (Signed)
md at bedside

## 2013-07-17 NOTE — ED Notes (Signed)
Pt ambulated well notified doctor Moran

## 2013-07-17 NOTE — ED Notes (Signed)
Pt assisted to the bathroom by rn, pt reports she started having diarrhea. rn went over foods to eat while having diarrhea. Pt wanted to rest for 10 more minutes to relax before getting dressed and being discharged.

## 2013-07-17 NOTE — ED Notes (Signed)
Pt escorted to discharge window. Pt verbalized understanding discharge instructions. In no acute distress.  

## 2013-07-17 NOTE — ED Notes (Signed)
Pt able to ambulate independently 

## 2013-07-17 NOTE — ED Provider Notes (Signed)
CSN: [id number]     Arrival date & time 07/17/13  1308 History     First MD Initiated Contact with Patient 07/17/13 1328     Chief Complaint  Patient presents with  . nausea, seen on 8/21 for same     Patient is a 72 y.o. female presenting with weakness. The history is provided by the patient.  Weakness This is a recurrent problem. The current episode started more than 1 week ago. The problem occurs daily. The problem has been gradually worsening. Associated symptoms include shortness of breath. Pertinent negatives include no chest pain, no abdominal pain and no headaches. Exacerbated by: position. The symptoms are relieved by rest. She has tried rest for the symptoms. The treatment provided no relief.  pt reports he just feels "sick" When asked about this, she reports feeling nausea and tired No active CP.  No syncope.  No vomiting.  She reports mild diarrhea that is nonbloody She does report SOB but this is not new and she is ambulatory No HA.  No new visual changes.  No focal arm/leg weakness.  No vertigo is reported but she reports that when she changes position nausea worsens.  She has h/o CAD but reports this is not similar to prior heart issues   Past Medical History  Diagnosis Date  . Hypertension   . CAD (coronary artery disease)   . Hyperlipemia   . Abdominal discomfort     "due to medication intolerance"   Past Surgical History  Procedure Laterality Date  . Angioplasty  03/01/2002    cutting balloon mid RCA  . Coronary stent placement  03/15/2005    distal RCA  . Nephrectomy    . Back surgery    . Cardiac catheterization  01/26/2007    mild diffuse CAD  . Cardiac catheterization  10/23/2009    nonobstructive CAD,60% prox RCA,50% prox LAD, 50% mid ramus   Family History  Problem Relation Age of Onset  . Hypertension Mother   . Diabetes Father    History  Substance Use Topics  . Smoking status: Never Smoker   . Smokeless tobacco: Not on file  . Alcohol Use: No    OB History   Grav Para Term Preterm Abortions TAB SAB Ect Mult Living   2 2 2       2      Review of Systems  Constitutional: Positive for fatigue. Negative for fever.  HENT: Negative for hearing loss.   Eyes: Negative for visual disturbance.  Respiratory: Positive for shortness of breath.   Cardiovascular: Negative for chest pain.  Gastrointestinal: Positive for nausea. Negative for vomiting and abdominal pain.  Genitourinary: Negative for dysuria.  Neurological: Positive for weakness. Negative for syncope and headaches.  All other systems reviewed and are negative.    Allergies  Cardizem; Codeine; Coreg; Demerol; Erythromycin; Inderal; Lisinopril; Procardia; Wellbutrin; and Zoloft  Home Medications   Current Outpatient Rx  Name  Route  Sig  Dispense  Refill  . atenolol (TENORMIN) 25 MG tablet   Oral   Take 1 tablet (25 mg total) by mouth 2 (two) times daily. Take 1 tablet in the morning and 2 tablets in the evening   90 tablet   3   . escitalopram (LEXAPRO) 20 MG tablet   Oral   Take 20 mg by mouth daily before breakfast.          . fexofenadine (ALLEGRA) 180 MG tablet   Oral   Take 180 mg by  mouth daily.         . hydrochlorothiazide (HYDRODIURIL) 25 MG tablet   Oral   Take 12.5 mg by mouth daily.          Marland Kitchen ibuprofen (ADVIL,MOTRIN) 200 MG tablet   Oral   Take 200 mg by mouth every 6 (six) hours as needed. For pain         . levothyroxine (SYNTHROID, LEVOTHROID) 25 MCG tablet   Oral   Take 25 mcg by mouth daily before breakfast.         . lisinopril (PRINIVIL,ZESTRIL) 5 MG tablet   Oral   Take 1 tablet (5 mg total) by mouth daily.   30 tablet   3   . methocarbamol (ROBAXIN) 500 MG tablet   Oral   Take 500 mg by mouth every 6 (six) hours as needed (spasms).         . ondansetron (ZOFRAN) 8 MG tablet   Oral   Take 1 tablet (8 mg total) by mouth every 8 (eight) hours as needed for nausea.   10 tablet   0   . potassium chloride  (K-DUR,KLOR-CON) 10 MEQ tablet   Oral   Take 10 mEq by mouth as needed (only takes if she dont eat potassium rich foods).          . promethazine (PHENERGAN) 25 MG tablet   Oral   Take 12.5-25 mg by mouth every 6 (six) hours as needed for nausea.         . traMADol (ULTRAM) 50 MG tablet   Oral   Take 50-100 mg by mouth every 4 (four) hours as needed for pain. For pain          BP 187/65  Pulse 69  Temp(Src) 98.6 F (37 C) (Oral)  Resp 20  SpO2 97% BP 160/70  Pulse 74  Temp(Src) 98.6 F (37 C) (Oral)  Resp 19  SpO2 100%  Physical Exam CONSTITUTIONAL: Well developed/well nourished HEAD: Normocephalic/atraumatic EYES: EOMI/PERRL, no nystagmus, no ptosos ENMT: Mucous membranes moist NECK: supple no meningeal signs SPINE:entire spine nontender CV: S1/S2 noted, no murmurs/rubs/gallops noted LUNGS: Lungs are clear to auscultation bilaterally, no apparent distress ABDOMEN: soft, nontender, no rebound or guarding GU:no cva tenderness NEURO: Pt is awake/alert, moves all extremitiesx4 No arm/leg drift.  No facial droop.  No pastpointing. No clonus. No hyper-reflexia   EXTREMITIES: pulses normal, full ROM SKIN: warm, color normal PSYCH: no abnormalities of mood noted  ED Course   Procedures   Labs Reviewed  CBC WITH DIFFERENTIAL  COMPREHENSIVE METABOLIC PANEL   Dg Chest 2 View  07/15/2013   *RADIOLOGY REPORT*  Clinical Data: Shortness of breath, nausea, vomiting, history of coronary artery disease  CHEST - 2 VIEW  Comparison: Chest x-ray of 07/26/2012  Findings: No active infiltrate or effusion is seen.  Mediastinal contours are stable.  The heart is within upper limits of normal. No bony abnormality is seen.  IMPRESSION: Stable chest x-ray.  No active lung disease.   Original Report Authenticated By: Dwyane Dee, M.D.   2:20 PM Pt seen recently for similar complaint Per records, she has f/u with cardiologist soon as she had elevated BP Currently no signs of acute  CVA.  Recent labs reviewed.  Will repeat cbc/cmp/troponin.  EKG unchanged today 3:48 PM Pt without headache.  No focal weakness.  She ambulated without difficulty and no ataxia. Denies current vertigo. She does have elevated BP but this has improved.  No signs of  acute hypertensive emergency.   Her main issue is nausea in the morning with elevated BP that improves throughout the day I will place call to her cardiology for BP med guidance.  However, I doubt ACS at this time (one troponin sent due to ongoing symptoms for days) 4:15 PM Patient spoke to dr hilty via phone She requests dose of ativan for her "nerves" Dr Rennis Golden advised to start taking lisinopril 20mg  daily.   Will also add on zofran for her nausea Pt feels comfortable with this plan She already has f/u with cardiologist next week She will also need PCP followup for mild hyperglycemia  MDM  Nursing notes including past medical history and social history reviewed and considered in documentation xrays reviewed and considered Labs/vital reviewed and considered Previous records reviewed and considered - recent ED notes/labs/imaging reviewed     Date: 07/17/2013  Rate: 72  Rhythm: normal sinus rhythm  QRS Axis: normal  Intervals: normal  ST/T Wave abnormalities: nonspecific ST changes  Conduction Disutrbances:none  Narrative Interpretation:   Old EKG Reviewed: unchanged from 8/21 PVCs noted on EKG      Joya Gaskins, MD 07/17/13 1617

## 2013-07-17 NOTE — ED Notes (Addendum)
Per ems pt c/o nausea and high blood sugar for last week. Pt was seen in ED on Thursday, dx with UTI. Pt denies pain. Pt states "she is tired of feeling sick".  20 g R forearm. 4 mg zofran given. cbg 213  Upon arrival to ED pt reports "just feeling sick", tired, nauseas. Denies pain. Reports chronic back discomfort.

## 2013-07-17 NOTE — ED Notes (Signed)
Bed: ZO10 Expected date: 07/17/13 Expected time: 12:55 PM Means of arrival: Ambulance Comments: Nausea seen for same yesterday

## 2013-07-18 NOTE — Progress Notes (Signed)
Discussed case with Dr. Bebe Shaggy. Labs unremarkable, troponin negative x 2. EKG non-ischemic. BP elevated but no at dangerous levels. She reports feeling "awful" - very anxious, afraid that she will have a heart attack or stroke. I spoke with her over the phone and re-assured her. I think some of her elevated BP is due to anxiety (she was very anxious and agitated when I spoke with her).  Her b-blocker was recently increased. I instructed the ED to increase her lisinopril to 20 mg QAM and thought she should take some of her home valium Rx tonight and try to rest. She has a scheduled follow-up with Dr. Salena Saner next week which I encouraged her to keep.  Chrystie Nose, MD, [facility] Attending Cardiologist [facility]

## 2013-07-19 ENCOUNTER — Ambulatory Visit (INDEPENDENT_AMBULATORY_CARE_PROVIDER_SITE_OTHER): Payer: Medicare Other | Admitting: Cardiology

## 2013-07-19 ENCOUNTER — Telehealth: Payer: Self-pay | Admitting: Cardiovascular Disease

## 2013-07-19 ENCOUNTER — Encounter: Payer: Self-pay | Admitting: Cardiology

## 2013-07-19 VITALS — BP 140/90 | HR 73 | Ht 63.0 in | Wt 181.0 lb

## 2013-07-19 DIAGNOSIS — I10 Essential (primary) hypertension: Secondary | ICD-10-CM | POA: Diagnosis not present

## 2013-07-19 DIAGNOSIS — R110 Nausea: Secondary | ICD-10-CM

## 2013-07-19 DIAGNOSIS — R55 Syncope and collapse: Secondary | ICD-10-CM

## 2013-07-19 DIAGNOSIS — E162 Hypoglycemia, unspecified: Secondary | ICD-10-CM | POA: Diagnosis not present

## 2013-07-19 DIAGNOSIS — I2510 Atherosclerotic heart disease of native coronary artery without angina pectoris: Secondary | ICD-10-CM | POA: Diagnosis not present

## 2013-07-19 DIAGNOSIS — F411 Generalized anxiety disorder: Secondary | ICD-10-CM

## 2013-07-19 DIAGNOSIS — Z789 Other specified health status: Secondary | ICD-10-CM | POA: Insufficient documentation

## 2013-07-19 DIAGNOSIS — F419 Anxiety disorder, unspecified: Secondary | ICD-10-CM

## 2013-07-19 MED ORDER — ATENOLOL 25 MG PO TABS: 50.0000 mg | ORAL_TABLET | Freq: Two times a day (BID) | ORAL | Status: DC

## 2013-07-19 MED ORDER — LISINOPRIL 5 MG PO TABS: 10.0000 mg | ORAL_TABLET | Freq: Every day | ORAL | Status: DC

## 2013-07-19 NOTE — Assessment & Plan Note (Addendum)
In September of 2013 her blood pressure was elevated her atenolol was stopped and Coreg was started. Since that time she has had numerous complaints of abdominal pain and nausea she has been seen in the emergency room since September several times for abdominal pain she's also been seen in our office and we have tried to adjust her medications to see if she could tolerate better. She continues with symptoms despite several medication changes. Most recent change was in July she was placed back on atenolol for neurocardiogenic syncope due to an abnormal tilt study at [facility] 15 years ago. Since then she's had episodic nausea hot flashes feeling terrible weak and unable to tolerate activity. She also has had increasing blood pressure. Her medications have been changed where she is now on atenolol 50 twice a day and on August 23 her lisinopril was increased from 5 to 20 mg daily.

## 2013-07-19 NOTE — Progress Notes (Signed)
07/19/2013   PCP: Gretel Acre, MD   Chief Complaint  Patient presents with  . office visit    nauseated, "sickness all over me"    Primary Cardiologist:  Dr. Royann Shivers  HPI:  72 year old white female with history of coronary artery disease with a bare-metal stent to the PDA of the RCA and balloon angioplasty to the LAD and diagonal arteries many years ago by Dr. Orvan Falconer. The RCA stent was placed in 2006. Her last cardiac cath was in 2010 and she had nonobstructive disease with 60% lesion in the RCA 50% lesion in the LAD and 50% mid lesion in the ramus. LV function was normal.  Since September she's had an multiple med adjustment secondary to abdominal pain or other complaints that she has had with medication. She had been on atenolol 50 twice a day but it was felt carvedilol may improve her symptoms and that was changed in September since then she went for numerous beta blockers and ARBS with continued complaints.  ACE was stopped due to cough.  In July she was placed back on atenolol for neurocardiogenic syncope symptoms and a history of positive tilt study 15 years ago.  CF at that time had increasing blood pressure and symptoms of hot flushing nausea and extreme weakness. She also had been having heart racing though she did not complaining of heart racing today.  She was seen in the emergency room August 21 and again August 23.  On the 21st the patient felt no one tried to help her, she stated she was told they couldnot find anything wrong and she was sent home.  On the 23rd labs were stable and prior to being discharged she told the ER physician she felt bad and she needed to have an answer.  Dr. Rennis Golden was contacted -her troponins were negative.  Her blood pressure initially 187/65 and then 160/70.  Her symptoms were weakness and nausea.  Dr. Rennis Golden felt anxiety was playing a large role in her symptoms and recommended Valium.  The patient admits that with the continued issues her  anxiety did continue to escalate.    Patient is here today because again this morning she woke up feeling terrible she knew her blood pressure was up, she was nauseated she also had some diarrhea and felt terrible.  Again no chest pain and no shortness of breath and no complaints of racing heart.  She also has a history of chronic fatigue syndrome and now I am wondering if this may be playing a larger role than we originally thought.   Allergies  Allergen Reactions  . Cardizem [Diltiazem Hcl]     nausea  . Codeine     Feeling weird   . Coreg [Carvedilol]   . Demerol [Meperidine]     unknown  . Erythromycin     Very sick  . Inderal [Propranolol]     Made me cry  . Lisinopril Cough  . Procardia [Nifedipine]   . Wellbutrin [Bupropion]   . Zoloft [Sertraline Hcl]     Could not talk    Current Outpatient Prescriptions  Medication Sig Dispense Refill  . aspirin 81 MG tablet Take 81 mg by mouth daily.      Marland Kitchen atenolol (TENORMIN) 25 MG tablet Take 2 tablets (50 mg total) by mouth 2 (two) times daily.      . diazepam (VALIUM) 5 MG tablet Take 5 mg by mouth daily.      Marland Kitchen escitalopram (  LEXAPRO) 20 MG tablet Take 20 mg by mouth daily before breakfast.       . fexofenadine (ALLEGRA) 180 MG tablet Take 180 mg by mouth daily.      . hydrochlorothiazide (HYDRODIURIL) 25 MG tablet Take 12.5 mg by mouth daily.       Marland Kitchen levothyroxine (SYNTHROID, LEVOTHROID) 50 MCG tablet Take 50 mcg by mouth daily before breakfast.      . lisinopril (PRINIVIL,ZESTRIL) 20 MG tablet Take 1 tablet (20 mg total) by mouth daily.  14 tablet  0  . ondansetron (ZOFRAN) 8 MG tablet Take 1 tablet (8 mg total) by mouth every 12 (twelve) hours as needed for nausea.  10 tablet  0  . potassium chloride (K-DUR,KLOR-CON) 10 MEQ tablet Take 10 mEq by mouth as needed (only takes if she dont eat potassium rich foods).       Marland Kitchen lisinopril (PRINIVIL,ZESTRIL) 5 MG tablet Take 2 tablets (10 mg total) by mouth daily.  90 tablet  3   No  current facility-administered medications for this visit.    Past Medical History  Diagnosis Date  . Hypertension   . CAD (coronary artery disease)     previous stent  . Hyperlipemia   . Abdominal discomfort     "due to medication intolerance"    Past Surgical History  Procedure Laterality Date  . Angioplasty  03/01/2002    cutting balloon mid RCA  . Coronary stent placement  03/15/2005    distal RCA  . Nephrectomy    . Back surgery    . Cardiac catheterization  01/26/2007    mild diffuse CAD  . Cardiac catheterization  10/23/2009    nonobstructive CAD,60% prox RCA,50% prox LAD, 50% mid ramus    ZOX:[id number]:UJ colds or fevers, weight is down 2 pounds,  Skin:no rashes or ulcers HEENT:no blurred vision, no congestion CV:see HPI PUL:see HPI GI:+ diarrhea today, no constipation or melena, no indigestion GU:no hematuria, no dysuria MS:no joint pain, no claudication Neuro:no syncope, no lightheadedness Endo:no diabetes though she has been hyperglycemic on most recent labs, no thyroid disease  PHYSICAL EXAM BP 140/90  Pulse 73  Ht 5\' 3"  (1.6 m)  Wt 181 lb (82.101 kg)  BMI 32.07 kg/m2 General:Pleasant but flat affect, NAD Skin:Warm and dry, brisk capillary refill HEENT:normocephalic, sclera clear, mucus membranes moist Neck:supple, no JVD, no bruits  Heart:S1S2 RRR without murmur, gallup, rub or click Lungs:clear without rales, rhonchi, or wheezes WJX:BJYN, non tender, + BS, do not palpate liver spleen or masses Ext:no lower ext edema, 2+ pedal pulses, 2+ radial pulses Neuro:alert and oriented, MAE, follows commands, + facial symmetry  EKG: SR rate of 73 and no acute changes.  ASSESSMENT AND PLAN Nausea alone Patient has been having significant nausea for the last week with 2 emergency room visits no chest pain associated and no shortness of breath.    Medication intolerance In September of 2013 her blood pressure was elevated her atenolol was stopped and Coreg was  started. Since that time she has had numerous complaints of abdominal pain and nausea she has been seen in the emergency room since September several times for abdominal pain she's also been seen in our office and we have tried to adjust her medications to see if she could tolerate better. She continues with symptoms despite several medication changes. Most recent change was in July she was placed back on atenolol for neuro cardiogenic syncope do to an abnormal tilt study at [facility] 15 years ago.  Since then she's had episodic nausea hot flashes feeling terrible weak and unable to tolerate activity. She also has had increasing blood pressure. Her medications have been changed where she is now on atenolol 50 twice a day and on August 23 her lisinopril was increased from 5 to 20 mg daily.   CAD (coronary artery disease) status post stent to distal right coronary artery 2006 Remote angioplasty to LAD and RCA in 1987 and 1995; 2.0 x 12 mm mini vision stent and distal right coronary artery 2006; cardiac catheterization 2010 with a nonobstructive intermediate severity lesions in the proximal right coronary artery (60%) proximal LAD (50%) proximal ramus intermedius (50%); normal nuclear stress test December 2011.  No chest pain no shortness of breath.  Anxiety Her anxiety has increased with all of the physical symptoms she's had. She did take Valium yesterday which did improve her symptoms she has some from her primary care physician I have asked her to take 5 mg up to 3 times a day if needed for anxiety.  Neurocardiogenic syncope no syncope throughout this time frame  Essential hypertension Blood pressure has been elevated in the emergency room and at home with medication adjustments. Today blood pressure continues to be elevated and I have increased her lisinopril to 20 mg in the morning and 10 mg in the evening. She also is now on atenolol 50 mg twice a day.   Blood pressure improved from the  emergency room but continues to be slightly elevated at increase lisonopril as  stated above.  She will see Dr. Royann Shivers this week, which will help [place] decide if her blood pressure is up he needs further increasing of her medication.  Her ex-husband Jesusita Oka that she lives with has not been with her on these visits it would be nice to have an outside source confirming her complaints to be able to assist her  Because she does seem pretty miserable.

## 2013-07-19 NOTE — Patient Instructions (Addendum)
Have blood work today.  Take Lisnopril 20 mg in AM and 10 mg in pm.  Take the valium three times a day if needed.  Hypoglycemia (Low Blood Sugar) Hypoglycemia is when the glucose (sugar) in your blood is too low. Hypoglycemia can happen for many reasons. It can happen to people with or without diabetes. Hypoglycemia can develop quickly and can be a medical emergency.  CAUSES  Having hypoglycemia does not mean that you will develop diabetes. Different causes include:  Missed or delayed meals or not enough carbohydrates eaten.  Medication overdose. This could be by accident or deliberate. If by accident, your medication may need to be adjusted or changed.  Exercise or increased activity without adjustments in carbohydrates or medications.  A nerve disorder that affects body functions like your heart rate, blood pressure and digestion (autonomic neuropathy).  A condition where the stomach muscles do not function properly (gastroparesis). Therefore, medications may not absorb properly.  The inability to recognize the signs of hypoglycemia (hypoglycemic unawareness).  Absorption of insulin  may be altered.  Alcohol consumption.  Pregnancy/menstrual cycles/postpartum. This may be due to hormones.  Certain kinds of tumors. This is very rare. SYMPTOMS   Sweating.  Hunger.  Dizziness.  Blurred vision.  Drowsiness.  Weakness.  Headache.  Rapid heart beat.  Shakiness.  Nervousness. DIAGNOSIS  Diagnosis is made by monitoring blood glucose in one or all of the following ways:  Fingerstick blood glucose monitoring.  Laboratory results. TREATMENT  If you think your blood glucose is low:  Check your blood glucose, if possible. If it is less than 70 mg/dl, take one of the following:  3-4 glucose tablets.   cup juice (prefer clear like apple).   cup "regular" soda pop.  1 cup milk.  -1 tube of glucose gel.  5-6 hard candies.  Do not over treat because your  blood glucose (sugar) will only go too high.  Wait 15 minutes and recheck your blood glucose. If it is still less than 70 mg/dl (or below your target range), repeat treatment.  Eat a snack if it is more than one hour until your next meal. Sometimes, your blood glucose may go so low that you are unable to treat yourself. You may need someone to help you. You may even pass out or be unable to swallow. This may require you to get an injection of glucagon, which raises the blood glucose. HOME CARE INSTRUCTIONS  Check blood glucose as recommended by your caregiver.  Take medication as prescribed by your caregiver.  Follow your meal plan. Do not skip meals. Eat on time.  If you are going to drink alcohol, drink it only with meals.  Check your blood glucose before driving.  Check your blood glucose before and after exercise. If you exercise longer or different than usual, be sure to check blood glucose more frequently.  Always carry treatment with you. Glucose tablets are the easiest to carry.  Always wear medical alert jewelry or carry some form of identification that states that you have diabetes. This will alert people that you have diabetes. If you have hypoglycemia, they will have a better idea on what to do. SEEK MEDICAL CARE IF:   You are having problems keeping your blood sugar at target range.  You are having frequent episodes of hypoglycemia.  You feel you might be having side effects from your medicines.  You have symptoms of an illness that is not improving after 3-4 days.  You notice  a change in vision or a new problem with your vision. SEEK IMMEDIATE MEDICAL CARE IF:   You are a family member or friend of a person whose blood glucose goes below 70 mg/dl and is accompanied by:  Confusion.  A change in mental status.  The inability to swallow.  Passing out. Document Released: 11/11/2005 Document Revised: 02/03/2012 Document Reviewed: 03/09/2012 [facility] Patient  Information 2014 [place], [place].

## 2013-07-19 NOTE — Assessment & Plan Note (Signed)
Blood pressure has been elevated in the emergency room and at home with medication adjustments. Today blood pressure continues to be elevated and I have increased her lisinopril to 20 mg in the morning and 10 mg in the evening. She also is now on atenolol 50 mg twice a day.

## 2013-07-19 NOTE — Telephone Encounter (Signed)
C/O feeling "sick all over"  the last several weeks.  Seen in ER 2 times over the weekend.  Meds were changed for BP control and she felt better yesterday, but today she feels bad again.  Offered appt this PM w/Laura Annie Paras, NP and she will try to keep this but she does have diarrhea so she is not sure if this will cause a problem.  "Just feels awful". States BP is fluctuating w/systolic sometimes >200 although she could not give me any readings from her home checks.  Will see this PM as stated above.

## 2013-07-19 NOTE — Assessment & Plan Note (Signed)
no syncope throughout this time frame

## 2013-07-19 NOTE — Telephone Encounter (Signed)
Patient seen in ED Thursday and Saturday .  Increased her Lisinopril and Atenolol.  Still sick.  Pt states she is "sick", gets hot, can't eat.  Doesn't know what to do....Marland KitchenMarland Kitchen

## 2013-07-19 NOTE — Assessment & Plan Note (Signed)
Her anxiety has increased with all of the physical symptoms she's had. She did take Valium yesterday which did improve her symptoms she has some from her primary care physician I have asked her to take 5 mg up to 3 times a day if needed for anxiety.

## 2013-07-19 NOTE — Assessment & Plan Note (Signed)
Patient has been having significant nausea for the last week with 2 emergency room visits no chest pain associated and no shortness of breath.

## 2013-07-19 NOTE — Assessment & Plan Note (Signed)
Remote angioplasty to LAD and RCA in 1987 and 1995; 2.0 x 12 mm mini vision stent and distal right coronary artery 2006; cardiac catheterization 2010 with a nonobstructive intermediate severity lesions in the proximal right coronary artery (60%) proximal LAD (50%) proximal ramus intermedius (50%); normal nuclear stress test December 2011.  No chest pain no shortness of breath.

## 2013-07-20 ENCOUNTER — Ambulatory Visit: Payer: Medicare Other | Admitting: Cardiovascular Disease

## 2013-07-20 LAB — BASIC METABOLIC PANEL
CO2: 25 mEq/L (ref 19–32)
Calcium: 10.6 mg/dL — ABNORMAL HIGH (ref 8.4–10.5)
Creat: 0.85 mg/dL (ref 0.50–1.10)
Sodium: 138 mEq/L (ref 135–145)

## 2013-07-20 LAB — HEMOGLOBIN A1C: Mean Plasma Glucose: 171 mg/dL — ABNORMAL HIGH (ref <117)

## 2013-07-21 ENCOUNTER — Ambulatory Visit (INDEPENDENT_AMBULATORY_CARE_PROVIDER_SITE_OTHER): Payer: Medicare Other | Admitting: Cardiovascular Disease

## 2013-07-21 ENCOUNTER — Encounter: Payer: Self-pay | Admitting: Cardiovascular Disease

## 2013-07-21 VITALS — BP 142/70 | HR 68 | Ht 63.0 in | Wt 182.3 lb

## 2013-07-21 DIAGNOSIS — R55 Syncope and collapse: Secondary | ICD-10-CM

## 2013-07-21 DIAGNOSIS — I2510 Atherosclerotic heart disease of native coronary artery without angina pectoris: Secondary | ICD-10-CM | POA: Diagnosis not present

## 2013-07-21 DIAGNOSIS — F329 Major depressive disorder, single episode, unspecified: Secondary | ICD-10-CM

## 2013-07-21 DIAGNOSIS — I10 Essential (primary) hypertension: Secondary | ICD-10-CM

## 2013-07-21 MED ORDER — ONDANSETRON HCL 8 MG PO TABS: 8.0000 mg | ORAL_TABLET | Freq: Two times a day (BID) | ORAL | Status: DC | PRN

## 2013-07-21 MED ORDER — FLUOXETINE HCL 20 MG PO CAPS: 20.0000 mg | ORAL_CAPSULE | Freq: Every day | ORAL | Status: DC

## 2013-07-21 MED ORDER — HYDROCHLOROTHIAZIDE 12.5 MG PO CAPS: 12.5000 mg | ORAL_CAPSULE | Freq: Every day | ORAL | Status: DC

## 2013-07-21 MED ORDER — LISINOPRIL 20 MG PO TABS: 20.0000 mg | ORAL_TABLET | Freq: Every morning | ORAL | Status: DC

## 2013-07-21 NOTE — Patient Instructions (Addendum)
  Your physician recommends that you schedule a follow-up appointment in: 6 weeks.  Call your blood pressure reading to our office in one week.  Check your blood pressure about 3pm every day and keep a log and call this in once a week.  Stop Lexapro.  Start Prozac 20mg  daily.

## 2013-07-22 ENCOUNTER — Telehealth: Payer: Self-pay | Admitting: Cardiology

## 2013-07-22 NOTE — Telephone Encounter (Signed)
Returned call.  Pt stated she wanted to talk to Vernona Rieger about something personal.  Asked pt if call had anything to do w/ her health or medical questions and pt denied.  Stated she feels better and RN informed her she sounds much better than previous conversations.  Informed Vernona Rieger, NP will be notified.  Pt verbalized understanding and agreed w/ plan.

## 2013-07-22 NOTE — Telephone Encounter (Signed)
This is a personal call for [name]

## 2013-07-22 NOTE — Telephone Encounter (Signed)
Returned call, call concerning previous MD.

## 2013-07-27 ENCOUNTER — Telehealth: Payer: Self-pay | Admitting: Cardiovascular Disease

## 2013-07-27 NOTE — Telephone Encounter (Signed)
Please call question about taking her medicine.

## 2013-07-27 NOTE — Telephone Encounter (Signed)
Dr. Salena Saner. Mrs . Elmore wanted you to be aware.

## 2013-07-27 NOTE — Telephone Encounter (Signed)
Patient called to inform that she will be starting her prozac prescription tommorow. Her blood pressures have been running in the normal range.  Thursday 138/76 HR was 64. Friday 143/78 HR 64. Note will e forwarded to Dr. Sandria Manly .

## 2013-07-29 ENCOUNTER — Telehealth: Payer: Self-pay | Admitting: Cardiovascular Disease

## 2013-07-29 NOTE — Telephone Encounter (Signed)
Please call -question about her medicine. °

## 2013-07-29 NOTE — Telephone Encounter (Signed)
Call to pharmacy and informed Rx is on file.  Not sure why not filled.  Will fill now.  Pt informed.

## 2013-07-29 NOTE — Telephone Encounter (Signed)
Returned call.  Pt stated she was told to take lisinopril in the morning and wait a little bit before taking the atenolol.  Pt stated she finds herself forgetting to take the atenolol and wants to know if she still needs to do this.  Asked pt how BP has been and pt stated it has been in 130s - 140s/70s.  Pt advised to take lisinopril 20 mg at 10am as directed and atenolol 50 mg at 12 pm per pt suggestion.  Pt informed Dr. Salena Saner will be notified and if further instructions given she will be notified.  Pt verbalized understanding and agreed w/ plan.  Pt also needs refill on lisinopril 20 mg.  Stated they didn't have it ready for her when her husband went to pick up the other meds.  Informed 4 Rxs sent the same day at the same time.  RN will call pharmacy to confirm Rx received.  Pt verbalized understanding and agreed w/ plan.  Call to pharmacy and they are closed.  Will reopen at 2pm.  Will call back.

## 2013-07-31 DIAGNOSIS — F329 Major depressive disorder, single episode, unspecified: Secondary | ICD-10-CM | POA: Insufficient documentation

## 2013-07-31 NOTE — Assessment & Plan Note (Signed)
Remote angioplasty to LAD and RCA in 1987 and 1995; 2.0 x 12 mm mini vision stent and distal right coronary artery 2006; cardiac catheterization 2010 with a nonobstructive intermediate severity lesions in the proximal right coronary artery (60%) proximal LAD (50%) proximal ramus intermedius (50%); normal nuclear stress test December 2011

## 2013-07-31 NOTE — Progress Notes (Signed)
Patient ID: [name], female   DOB: 1941-05-10, 72 y.o.   MRN: [id number]     Reason for office visit Followup CAD, hypertension and history of neurally mediated syncope  [name] returns today and is not feeling well. She was seen in the emergency room on August 23 with complaints of feeling "extremely sick". When asked to be a little more details about her complaints she is very vague. She is clear about the fact that she could not express either chest tightness or shortness of breath or syncope. She was very weak and felt like something very bad was going to happen. She had severe nausea and some vomiting but no abdominal pain . The nausea has persisted for a whole week and is finally getting better . She is rather incensed by the fact that she had what she considers to be a summary workup in the ED and felt that "they weren't even trying to help me". Her electrocardiogram, chest x-ray and laboratory tests including cardiac enzymes electrolytes and CBC were all normal with exception of very mild  hypokalemia and mild nonfasting hyperglycemia. She is feeling a little better today. She can't really put her finger on what made her feel sick.  Her blood pressure checked with her home monitor is slightly higher than in the office but the difference is probably not meaningful. Her blood pressures at the upper limit of normal. Her medicines have been readjusted. Her lisinopril has been changed to 20 g in the morning and 10 mg in the evening. She is now taking atenolol 50 mg twice daily, which she thinks is better than the carvedilol.  She felt very melancholy today and asked me repeatedly to "not give up on her".   Allergies  Allergen Reactions  . Cardizem [Diltiazem Hcl]     nausea  . Codeine     Feeling weird   . Coreg [Carvedilol]   . Demerol [Meperidine]     unknown  . Erythromycin     Very sick  . Inderal [Propranolol]     Made me cry  . Lisinopril Cough  . Procardia [Nifedipine]     . Wellbutrin [Bupropion]   . Zoloft [Sertraline Hcl]     Could not talk    Current Outpatient Prescriptions  Medication Sig Dispense Refill  . aspirin 81 MG tablet Take 81 mg by mouth daily.      Marland Kitchen atenolol (TENORMIN) 25 MG tablet Take 2 tablets (50 mg total) by mouth 2 (two) times daily.      . diazepam (VALIUM) 5 MG tablet Take 5 mg by mouth every 12 (twelve) hours as needed.       . fexofenadine (ALLEGRA) 180 MG tablet Take 180 mg by mouth daily as needed.       . hydrochlorothiazide (MICROZIDE) 12.5 MG capsule Take 1 capsule (12.5 mg total) by mouth daily.  30 capsule  6  . levothyroxine (SYNTHROID, LEVOTHROID) 50 MCG tablet Take 50 mcg by mouth daily before breakfast.      . lisinopril (PRINIVIL,ZESTRIL) 20 MG tablet Take 1 tablet (20 mg total) by mouth every morning.  30 tablet  6  . lisinopril (PRINIVIL,ZESTRIL) 5 MG tablet Take 10 mg by mouth every evening.      . ondansetron (ZOFRAN) 8 MG tablet Take 1 tablet (8 mg total) by mouth every 12 (twelve) hours as needed for nausea.  10 tablet  0  . potassium chloride (K-DUR,KLOR-CON) 10 MEQ tablet Take 10 mEq by mouth  as needed (only takes if she dont eat potassium rich foods).       Marland Kitchen FLUoxetine (PROZAC) 20 MG capsule Take 1 capsule (20 mg total) by mouth daily.  30 capsule  3  . traMADol (ULTRAM) 50 MG tablet Take 50 mg by mouth as needed.       No current facility-administered medications for this visit.    Past Medical History  Diagnosis Date  . Hypertension   . CAD (coronary artery disease)     previous stent  . Hyperlipemia   . Abdominal discomfort     "due to medication intolerance"    Past Surgical History  Procedure Laterality Date  . Angioplasty  03/01/2002    cutting balloon mid RCA  . Coronary stent placement  03/15/2005    distal RCA  . Nephrectomy    . Back surgery    . Cardiac catheterization  01/26/2007    mild diffuse CAD  . Cardiac catheterization  10/23/2009    nonobstructive CAD,60% prox RCA,50% prox  LAD, 50% mid ramus    Family History  Problem Relation Age of Onset  . Hypertension Mother   . Diabetes Father   . Heart attack Father   . Heart attack Brother     History   Social History  . Marital Status: Married    Spouse Name: N/A    Number of Children: N/A  . Years of Education: N/A   Occupational History  . Not on file.   Social History Main Topics  . Smoking status: Never Smoker   . Smokeless tobacco: Never Used  . Alcohol Use: No  . Drug Use: No  . Sexual Activity: Not on file   Other Topics Concern  . Not on file   Social History Narrative  . No narrative on file   Review of systems:  The patient specifically denies any chest pain at rest exertion, dyspnea at rest or with exertion, orthopnea, paroxysmal nocturnal dyspnea, syncope, palpitations, focal neurological deficits, intermittent claudication, lower extremity edema, unexplained weight gain, cough, hemoptysis or wheezing.  She has a variety of aches and pains in multiple locations. She has dizziness and lightheadedness. She complains of weakness and fatigue. Fatigue dominates over shortness of breath. She has a lot of "stress issues"   PHYSICAL EXAM  General: Alert, oriented x3, no distress  Head: no evidence of trauma, PERRL, EOMI, no exophtalmos or lid lag, no myxedema, no xanthelasma; normal ears, nose and oropharynx  Neck: normal jugular venous pulsations and no hepatojugular reflux; brisk carotid pulses without delay and no carotid bruits  Chest: clear to auscultation, no signs of consolidation by percussion or palpation, normal fremitus, symmetrical and full respiratory excursions  Cardiovascular: normal position and quality of the apical impulse, regular rhythm, normal first and second heart sounds, no murmurs, rubs or gallops  Abdomen: no tenderness or distention, no masses by palpation, no abnormal pulsatility or arterial bruits, normal bowel sounds, no hepatosplenomegaly  Extremities: no  clubbing, cyanosis or edema; 2+ radial, ulnar and brachial pulses bilaterally; 2+ right femoral, posterior tibial and dorsalis pedis pulses; 2+ left femoral, posterior tibial and dorsalis pedis pulses; no subclavian or femoral bruits  Neurological: grossly nonfocal  BP 142/70  Pulse 68  Ht 5\' 3"  (1.6 m)  Wt 182 lb 4.8 oz (82.691 kg)  BMI 32.3 kg/m2  Lipid Panel     Component Value Date/Time   CHOL  Value: 255        ATP III CLASSIFICATION:  <  200     mg/dL   Desirable  914-782  mg/dL   Borderline High  >=956    mg/dL   High       * 21/30/8657 0520   TRIG 124 11/21/2010 0520   HDL 56 11/21/2010 0520   CHOLHDL 4.6 11/21/2010 0520   VLDL 25 11/21/2010 0520   LDLCALC  Value: 174        Total Cholesterol/HDL:CHD Risk Coronary Heart Disease Risk Table                     Men   Women  1/2 Average Risk   3.4   3.3  Average Risk       5.0   4.4  2 X Average Risk   9.6   7.1  3 X Average Risk  23.4   11.0        Use the calculated Patient Ratio above and the CHD Risk Table to determine the patient's CHD Risk.        ATP III CLASSIFICATION (LDL):  <100     mg/dL   Optimal  846-962  mg/dL   Near or Above                    Optimal  130-159  mg/dL   Borderline  952-841  mg/dL   High  >324     mg/dL   Very High* 40/08/2724 0520    BMET    Component Value Date/Time   NA 138 07/19/2013 1654   K 5.4* 07/19/2013 1654   CL 102 07/19/2013 1654   CO2 25 07/19/2013 1654   GLUCOSE 145* 07/19/2013 1654   BUN 12 07/19/2013 1654   CREATININE 0.85 07/19/2013 1654   CREATININE 0.61 07/17/2013 1400   CALCIUM 10.6* 07/19/2013 1654   GFRNONAA 88* 07/17/2013 1400   GFRAA >90 07/17/2013 1400     ASSESSMENT AND PLAN  Essential hypertension    Neurocardiogenic syncope This was a remote problem  CAD (coronary artery disease) status post stent to distal right coronary artery 2006 Remote angioplasty to LAD and RCA in 1987 and 1995; 2.0 x 12 mm mini vision stent and distal right coronary artery 2006; cardiac  catheterization 2010 with a nonobstructive intermediate severity lesions in the proximal right coronary artery (60%) proximal LAD (50%) proximal ramus intermedius (50%); normal nuclear stress test December 2011  Depression      Jeraldin's complaints are highly nonspecific which is what is so hard to find a way to help her feel better. It is conceivable that the vasodilator properties of the carvedilol or disadvantageous if she truly has a background that is conducive to neurocardiogenic syncope.  Recently, although she subjectively feels poorly there are no objective findings of an acute coronary syndrome or other life-threatening underlying condition.  I think depression is clearly present and playing a significant role in her complaints. I made no changes in her antihypertensive medications. Since hot flashes are such a big part of her complaints it may be worthwhile try to switch her antidepressant. Prozac now for some advantage. She did well with that in the past. She will take this instead of Lexapro.  I have asked her to keep a record of her blood pressure and bring it to [place].  Meds ordered this encounter  Medications  . DISCONTD: hydrochlorothiazide (MICROZIDE) 12.5 MG capsule    Sig: Take 12.5 mg by mouth daily.  Marland Kitchen DISCONTD: lisinopril (PRINIVIL,ZESTRIL) 20 MG tablet  Sig: Take 20 mg by mouth every morning.  Marland Kitchen lisinopril (PRINIVIL,ZESTRIL) 5 MG tablet    Sig: Take 10 mg by mouth every evening.  . traMADol (ULTRAM) 50 MG tablet    Sig: Take 50 mg by mouth as needed.  Marland Kitchen FLUoxetine (PROZAC) 20 MG capsule    Sig: Take 1 capsule (20 mg total) by mouth daily.    Dispense:  30 capsule    Refill:  3  . ondansetron (ZOFRAN) 8 MG tablet    Sig: Take 1 tablet (8 mg total) by mouth every 12 (twelve) hours as needed for nausea.    Dispense:  10 tablet    Refill:  0  . lisinopril (PRINIVIL,ZESTRIL) 20 MG tablet    Sig: Take 1 tablet (20 mg total) by mouth every morning.    Dispense:  30  tablet    Refill:  6    No blister pack  . hydrochlorothiazide (MICROZIDE) 12.5 MG capsule    Sig: Take 1 capsule (12.5 mg total) by mouth daily.    Dispense:  30 capsule    Refill:  6    Alnisa Hasley  Thurmon Fair, MD, [facility] [phone] office [phone] pager

## 2013-07-31 NOTE — Assessment & Plan Note (Signed)
This was a remote problem

## 2013-08-02 ENCOUNTER — Telehealth: Payer: Self-pay | Admitting: Cardiovascular Disease

## 2013-08-02 NOTE — Telephone Encounter (Signed)
Returned call.  Pt wants to know if Dr. Salena Saner will let her increase her Prozac.  Stated she more alert, but not like she thinks she should be.  Pt informed it usually takes several weeks to reach a therapeutic level with this type of medication and Dr. Salena Saner will be notified.  Pt verbalized understanding.  Stated she has a glimpse of what she can feel like, but every day isn't like that.  Stated she is excited and ready to get up and go.  Pt advised to be patient and allow the medication time to work and informed Dr. Salena Saner will be notified.  Pt verbalized understanding and agreed w/ plan.  Message forwarded to Dr. Royann Shivers.

## 2013-08-02 NOTE — Telephone Encounter (Signed)
Prefer to wait 3-4 weeks before changes please

## 2013-08-02 NOTE — Telephone Encounter (Signed)
Pt wants to know if she can increase her Prozac.

## 2013-08-03 NOTE — Telephone Encounter (Signed)
Returned call.  Left message that the MD prefers to wait 3-4 weeks before changes and to call back before 4pm if questions.

## 2013-08-11 ENCOUNTER — Telehealth: Payer: Self-pay | Admitting: Cardiovascular Disease

## 2013-08-11 MED ORDER — LISINOPRIL 20 MG PO TABS: 20.0000 mg | ORAL_TABLET | Freq: Every morning | ORAL | Status: DC

## 2013-08-11 NOTE — Telephone Encounter (Signed)
Call to pharmacy and asked if lisinopril can be filled w/o blister pack as pt has difficulty opening pills.  Pharmacist laughed and stated they don't have any control over it unless the prescription is for an odd number like 31 or 46.  RN asked if Rx was sent for #31 would it be able to be filled w/o blister pack.  Pharmacist stated it would.  Informed RN will resend Rx for #31.  Verbalized understanding and stated she will make a note in pt's file.  Call to pt and pt informed.  Pt verbalized understanding and agreed w/ plan.  Pt will call back next month if problems filling Rx.

## 2013-08-11 NOTE — Telephone Encounter (Signed)
Patient needs to have her Lisinopril refilled.  Can we call [employer] and request that for the next refill that the medication not be in a blister pack?  Patient has a difficult time with these.  If [employer] do this, can we call [facility] on [address].

## 2013-08-18 ENCOUNTER — Telehealth: Payer: Self-pay | Admitting: Cardiovascular Disease

## 2013-08-18 MED ORDER — FLUOXETINE HCL 20 MG PO CAPS: 40.0000 mg | ORAL_CAPSULE | Freq: Every day | ORAL | Status: DC

## 2013-08-18 NOTE — Telephone Encounter (Signed)
Returned call and informed pt per instructions by MD/PA.  Pt verbalized understanding and agreed w/ plan.  Refill(s) sent to pharmacy.  

## 2013-08-18 NOTE — Telephone Encounter (Signed)
Returned call.  Pt wants to know if Dr. Salena Saner will increase the Prozac.  Asked pt if any changes since we talked a few weeks ago.  Overall pt feels okay, but stated she feels depressed today.  Pt informed Dr. Salena Saner will be notified for further instructions.  Pt verbalized understanding and agreed w/ plan.  Stated she feels so close to feeling good, but is not quite there.    Message forwarded to Dr. Royann Shivers.

## 2013-08-18 NOTE — Telephone Encounter (Signed)
Returned call.  Left message to call back before 4pm.  

## 2013-08-18 NOTE — Telephone Encounter (Signed)
Yes. It's OK to double the fluoxetine (prozac) dose Thurmon Fair, MD, [facility] [phone] office [phone] pager

## 2013-08-18 NOTE — Telephone Encounter (Signed)
Returning your call. °

## 2013-08-18 NOTE — Telephone Encounter (Signed)
Please call-wants Dr C to increase her Prozac.

## 2013-08-19 DIAGNOSIS — M1710 Unilateral primary osteoarthritis, unspecified knee: Secondary | ICD-10-CM | POA: Diagnosis not present

## 2013-08-24 DIAGNOSIS — M545 Low back pain: Secondary | ICD-10-CM | POA: Diagnosis not present

## 2013-08-25 ENCOUNTER — Telehealth: Payer: Self-pay | Admitting: Cardiovascular Disease

## 2013-08-25 NOTE — Telephone Encounter (Signed)
Returned call.  Pt stated she is due for refill on her atenolol and would like to get it w/o the blister packs.  Pt started discussing the dosages of meds and pt taking different than what's on file.  Pt stated she takes Lisinopril 20 mg in AM and 10 mg in PM and Atenolol 25 mg in AM and 50 mg in PM.  Pt informed records indicate PM dose of Lisinopril dc'd at visit w/ Dr. Salena Saner and Atenolol was increased to 50 mg twice daily.  RN asked pt if she has been checking BP and pt stated her monitor has been giving her an error.  Plans to get a new one soon.  RN advised new Rx for atenolol (31-day supply) will not be sent and advised she take what she has and schedule a sooner appt w/ Dr. Salena Saner so her BP can be checked and meds adjusted.  Pt verbalized understanding and agreed w/ plan.  Appt rescheduled from 10.9.14 at 4:30pm to 10.3.14 at 4:30pm w/ Dr. Salena Saner.

## 2013-08-25 NOTE — Telephone Encounter (Signed)
Please call-need to talk to you about your medicine.

## 2013-08-27 ENCOUNTER — Ambulatory Visit: Payer: Medicare Other | Admitting: Cardiovascular Disease

## 2013-09-02 ENCOUNTER — Ambulatory Visit: Payer: Medicare Other | Admitting: Cardiovascular Disease

## 2013-09-02 DIAGNOSIS — H524 Presbyopia: Secondary | ICD-10-CM | POA: Diagnosis not present

## 2013-09-02 DIAGNOSIS — G8929 Other chronic pain: Secondary | ICD-10-CM | POA: Diagnosis not present

## 2013-09-02 DIAGNOSIS — R7309 Other abnormal glucose: Secondary | ICD-10-CM | POA: Diagnosis not present

## 2013-09-02 DIAGNOSIS — H2589 Other age-related cataract: Secondary | ICD-10-CM | POA: Diagnosis not present

## 2013-09-02 DIAGNOSIS — E039 Hypothyroidism, unspecified: Secondary | ICD-10-CM | POA: Diagnosis not present

## 2013-09-02 DIAGNOSIS — F411 Generalized anxiety disorder: Secondary | ICD-10-CM | POA: Diagnosis not present

## 2013-09-02 DIAGNOSIS — R7989 Other specified abnormal findings of blood chemistry: Secondary | ICD-10-CM | POA: Diagnosis not present

## 2013-09-02 DIAGNOSIS — R5381 Other malaise: Secondary | ICD-10-CM | POA: Diagnosis not present

## 2013-09-06 ENCOUNTER — Ambulatory Visit (INDEPENDENT_AMBULATORY_CARE_PROVIDER_SITE_OTHER): Payer: Medicare Other | Admitting: Cardiovascular Disease

## 2013-09-06 ENCOUNTER — Encounter: Payer: Self-pay | Admitting: Cardiovascular Disease

## 2013-09-06 VITALS — BP 142/72 | HR 81 | Resp 16 | Ht 63.0 in | Wt 177.3 lb

## 2013-09-06 DIAGNOSIS — I2510 Atherosclerotic heart disease of native coronary artery without angina pectoris: Secondary | ICD-10-CM | POA: Diagnosis not present

## 2013-09-06 DIAGNOSIS — G47419 Narcolepsy without cataplexy: Secondary | ICD-10-CM

## 2013-09-06 NOTE — Patient Instructions (Signed)
Your are being referred back to Dr. Vickey Huger for Narcolepsy.  Take the Atenolol 25mg  1 tablet in the morning and 2 tablets in the evening.  Your physician recommends that you schedule a follow-up appointment in: 6 months.

## 2013-09-07 ENCOUNTER — Telehealth: Payer: Self-pay | Admitting: Cardiovascular Disease

## 2013-09-07 NOTE — Telephone Encounter (Signed)
[facility] to refer pt to Dr. Vickey Huger. Per Diane with diagnosis "narcolepsy" the referral will be forwarded to the sleep clinic.

## 2013-09-08 ENCOUNTER — Encounter: Payer: Self-pay | Admitting: Cardiovascular Disease

## 2013-09-08 DIAGNOSIS — M545 Low back pain: Secondary | ICD-10-CM | POA: Diagnosis not present

## 2013-09-08 MED ORDER — ATENOLOL 25 MG PO TABS: ORAL_TABLET | ORAL | Status: DC

## 2013-09-08 NOTE — Progress Notes (Signed)
Patient ID: [name], female   DOB: September 15, 1941, 72 y.o.   MRN: [id number]      Reason for office visit Hypertension, neurally mediated syncope  Dyke Maes states that she feels a lot better after starting treatment for the higher dose of Prozac. She feels more energetic. However she makes it clear today that what makes her feel best as when she takes Adderall. Without this medication she is depressed, Biaxin issued to and energy and wants to sleep all day long. She asked me today I could give her prescription for Nuvigil. She does have a history of sleep disordered breathing. Her blood pressure control has generally been good. At home she has recorded a blood pressure of 129/70 earlier today. She has not experienced syncope. She complains of pain in her left knee.   Allergies  Allergen Reactions  . Cardizem [Diltiazem Hcl]     nausea  . Codeine     Feeling weird   . Coreg [Carvedilol]   . Demerol [Meperidine]     unknown  . Erythromycin     Very sick  . Inderal [Propranolol]     Made me cry  . Lisinopril Cough  . Procardia [Nifedipine]   . Wellbutrin [Bupropion]   . Zoloft [Sertraline Hcl]     Could not talk    Current Outpatient Prescriptions  Medication Sig Dispense Refill  . aspirin 81 MG tablet Take 81 mg by mouth daily.      Marland Kitchen atenolol (TENORMIN) 25 MG tablet Take 25 mg by mouth 3 (three) times daily.      . diazepam (VALIUM) 5 MG tablet Take 5 mg by mouth every 12 (twelve) hours as needed.       . fexofenadine (ALLEGRA) 180 MG tablet Take 180 mg by mouth daily as needed.       Marland Kitchen FLUoxetine (PROZAC) 20 MG capsule Take 2 capsules (40 mg total) by mouth daily.  60 capsule  3  . hydrochlorothiazide (MICROZIDE) 12.5 MG capsule Take 1 capsule (12.5 mg total) by mouth daily.  30 capsule  6  . levothyroxine (SYNTHROID, LEVOTHROID) 50 MCG tablet Take 50 mcg by mouth daily before breakfast.      . lisinopril (PRINIVIL,ZESTRIL) 20 MG tablet Take 10-20 mg by mouth. 20mg  QAM, 10mg   QHS      . ondansetron (ZOFRAN) 8 MG tablet Take 1 tablet (8 mg total) by mouth every 12 (twelve) hours as needed for nausea.  10 tablet  0  . potassium chloride (K-DUR,KLOR-CON) 10 MEQ tablet Take 10 mEq by mouth as needed (only takes if she dont eat potassium rich foods).       . traMADol (ULTRAM) 50 MG tablet Take 50-100 mg by mouth every 6 (six) hours as needed.        No current facility-administered medications for this visit.    Past Medical History  Diagnosis Date  . Hypertension   . CAD (coronary artery disease)     previous stent  . Hyperlipemia   . Abdominal discomfort     "due to medication intolerance"    Past Surgical History  Procedure Laterality Date  . Angioplasty  03/01/2002    cutting balloon mid RCA  . Coronary stent placement  03/15/2005    distal RCA  . Nephrectomy    . Back surgery    . Cardiac catheterization  01/26/2007    mild diffuse CAD  . Cardiac catheterization  10/23/2009    nonobstructive CAD,60% prox RCA,50% prox LAD,  50% mid ramus    Family History  Problem Relation Age of Onset  . Hypertension Mother   . Diabetes Father   . Heart attack Father   . Heart attack Brother     History   Social History  . Marital Status: Married    Spouse Name: N/A    Number of Children: N/A  . Years of Education: N/A   Occupational History  . Not on file.   Social History Main Topics  . Smoking status: Never Smoker   . Smokeless tobacco: Never Used  . Alcohol Use: No  . Drug Use: No  . Sexual Activity: Not on file   Other Topics Concern  . Not on file   Social History Narrative  . No narrative on file    Review of systems: The patient specifically denies any chest pain at rest exertion, dyspnea at rest or with exertion, orthopnea, paroxysmal nocturnal dyspnea, syncope, palpitations, focal neurological deficits, intermittent claudication, lower extremity edema, unexplained weight gain, cough, hemoptysis or wheezing.   PHYSICAL EXAM BP  142/72  Pulse 81  Resp 16  Ht 5\' 3"  (1.6 m)  Wt 177 lb 4.8 oz (80.423 kg)  BMI 31.42 kg/m2  General: Alert, oriented x3, no distress Head: no evidence of trauma, PERRL, EOMI, no exophtalmos or lid lag, no myxedema, no xanthelasma; normal ears, nose and oropharynx Neck: normal jugular venous pulsations and no hepatojugular reflux; brisk carotid pulses without delay and no carotid bruits Chest: clear to auscultation, no signs of consolidation by percussion or palpation, normal fremitus, symmetrical and full respiratory excursions Cardiovascular: normal position and quality of the apical impulse, regular rhythm, normal first and second heart sounds, no murmurs, rubs or gallops Abdomen: no tenderness or distention, no masses by palpation, no abnormal pulsatility or arterial bruits, normal bowel sounds, no hepatosplenomegaly Extremities: no clubbing, cyanosis or edema; 2+ radial, ulnar and brachial pulses bilaterally; 2+ right femoral, posterior tibial and dorsalis pedis pulses; 2+ left femoral, posterior tibial and dorsalis pedis pulses; no subclavian or femoral bruits Neurological: grossly nonfocal   EKG: Sinus rhythm, normal tracing   BMET    Component Value Date/Time   NA 138 07/19/2013 1654   K 5.4* 07/19/2013 1654   CL 102 07/19/2013 1654   CO2 25 07/19/2013 1654   GLUCOSE 145* 07/19/2013 1654   BUN 12 07/19/2013 1654   CREATININE 0.85 07/19/2013 1654   CREATININE 0.61 07/17/2013 1400   CALCIUM 10.6* 07/19/2013 1654   GFRNONAA 88* 07/17/2013 1400   GFRAA >90 07/17/2013 1400     ASSESSMENT AND PLAN I do not think it would be wise for me to prescribe a stimulant to [place]. Although her blood pressure is now well controlled she does have a history of coronary problems. It is possible that she truly has not Klebsiella and that this medication is truly indicated. However I believe she would benefit from formal evaluation by a neurologist and sleep medicine specialist. I have recommended that  she follow up with Dr. Vickey Huger who I believe she has seen in the past. Her cardiac problems are currently fairly well compensated and I do not think that stimulants are necessarily contraindicated, but they should be used with caution. I have made no changes in her medications today.  Orders Placed This Encounter  Procedures  . Ambulatory referral to Neurology  . EKG 12-Lead   Meds ordered this encounter  Medications  . atenolol (TENORMIN) 25 MG tablet    Sig: Take 25 mg  by mouth 3 (three) times daily.  Marland Kitchen lisinopril (PRINIVIL,ZESTRIL) 20 MG tablet    Sig: Take 10-20 mg by mouth. 20mg  QAM, 10mg  QHS    Sallie Staron  Thurmon Fair, MD, [facility] [phone] office [phone] pager

## 2013-09-09 ENCOUNTER — Other Ambulatory Visit: Payer: Self-pay | Admitting: Physical Medicine and Rehabilitation

## 2013-09-09 ENCOUNTER — Encounter: Payer: Self-pay | Admitting: Cardiovascular Disease

## 2013-09-09 DIAGNOSIS — M961 Postlaminectomy syndrome, not elsewhere classified: Secondary | ICD-10-CM

## 2013-09-10 ENCOUNTER — Ambulatory Visit (INDEPENDENT_AMBULATORY_CARE_PROVIDER_SITE_OTHER): Payer: Medicare Other | Admitting: Neurology

## 2013-09-10 ENCOUNTER — Encounter: Payer: Self-pay | Admitting: Neurology

## 2013-09-10 VITALS — BP 134/65 | HR 73 | Resp 17 | Ht 63.0 in | Wt 174.0 lb

## 2013-09-10 DIAGNOSIS — E669 Obesity, unspecified: Secondary | ICD-10-CM | POA: Diagnosis not present

## 2013-09-10 DIAGNOSIS — G894 Chronic pain syndrome: Secondary | ICD-10-CM | POA: Diagnosis not present

## 2013-09-10 DIAGNOSIS — G4710 Hypersomnia, unspecified: Secondary | ICD-10-CM | POA: Diagnosis not present

## 2013-09-10 HISTORY — DX: Chronic pain syndrome: G89.4

## 2013-09-10 HISTORY — DX: Hypersomnia, unspecified: G47.10

## 2013-09-10 HISTORY — DX: Obesity, unspecified: E66.9

## 2013-09-10 MED ORDER — AMPHETAMINE-DEXTROAMPHETAMINE 20 MG PO TABS: 20.0000 mg | ORAL_TABLET | Freq: Every day | ORAL | Status: DC

## 2013-09-10 NOTE — Patient Instructions (Signed)
Hypersomnia Hypersomnia usually brings recurrent episodes of excessive daytime sleepiness or prolonged nighttime sleep. It is different than feeling tired due to lack of or interrupted sleep at night. People with hypersomnia are compelled to nap repeatedly during the day. This is often at inappropriate times such as:  At work.  During a meal.  In conversation. These daytime naps usually provide no relief. This disorder typically affects adolescents and young adults. CAUSES  This condition may be caused by:  Another sleep disorder (such as narcolepsy or sleep apnea).  Dysfunction of the autonomic nervous system.  Drug or alcohol abuse.  A physical problem, such as:  A tumor.  Head trauma. This is damage caused by an accident.  Injury to the central nervous system.  Certain medications, or medicine withdrawal.  Medical conditions may contribute to the disorder, including:  Multiple sclerosis.  Depression.  Encephalitis.  Epilepsy.  Obesity.  Some people appear to have a genetic predisposition to this disorder. In others, there is no known cause. SYMPTOMS   Patients often have difficulty waking from a long sleep. They may feel dazed or confused.  Other symptoms may include:  Anxiety.  Increased irritation (inflammation).  Decreased energy.  Restlessness.  Slow thinking.  Slow speech.  Loss of appetite.  Hallucinations.  Memory difficulty.  Tremors, Tics.  Some patients lose the ability to function in family, social, occupational, or other settings. TREATMENT  Treatment is symptomatic in nature. Stimulants and other drugs may be used to treat this disorder. Changes in behavior may help. For example, avoid night work and social activities that delay bed time. Changes in diet may offer some relief. Patients should avoid alcohol and caffeine. PROGNOSIS  The likely outcome (prognosis) for persons with hypersomnia depends on the cause of the disorder.  The disorder itself is not life threatening. But it can have serious consequences. For example, automobile accidents can be caused by falling asleep while driving. The attacks usually continue indefinitely. Document Released: 11/01/2002 Document Revised: 02/03/2012 Document Reviewed: 10/05/2008 ExitCare Patient Information 2014 [employer].  

## 2013-09-10 NOTE — Progress Notes (Signed)
[facility]   Provider:  Melvyn Novas, M D  Referring Provider: Gretel Acre, MD Primary Care Physician:  Gretel Acre, MD  Chief Complaint  Patient presents with  . Narcolepsy    sleep consult    HPI:  [name] is a 72 y.o. female  Is seen here as a referral  from Dr. Ihor Dow for  Evaluation of hypersomnia.   Mrs.  Williams recalled having seen me briefly in 2006-7 , and she reported that she was sick and fatigued at the time. 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  I had started her on Concerta in 2006 and she felt this helped her fatigue without causing cardiac or BP Problems. [name] has had meanwhile returned from [place],  where her husband's work had transferred the family for 7 years . She has not experienced any syncope,  but she states that she feels reasonably  little better since being on stimulants and PROZAC.  This was also given to control hot flashes, the previous medication tried for the same symptoms was Effexor,  but didn't work as well. He is still doing the very best on prozac and stimulants,  With  carful monitoring of her cardiac CA- disease. She had a cardiac catheterization in 2008 she has documented coronary artery disease and had undergone a total of 9 angioplasties.  She is established mild was able cardiologist, Dr.Mihai  Croitoru  from [facility] .   She reports a lot of symptoms that could be related to psychatric diagnosis.  She reports fatigue, malaise, weight gain. EDS with an Epworth of 20 and FSS of 63, GDS of  6 points. She feels that she was an" active and tenacious" person, but attributed to  fatigue and malaise she is feeling  cognitivly , mentally and physically handicapped.    She has high expectations in returning to Adderall medication,  but with  her cardiologists supervision. She has been doing well on Adderall bid ( non extended release).   She is aware that I am not treating chronic pain.   Review of Systems: Out of a complete 14 system review, the patient complains of only the following symptoms, and all other reviewed systems are negative.   Malaise and fatigue .  EDS 20 , but related not to narcolepsy.   History   Social History  . Marital Status: Married    Spouse Name: N/A    Number of Children: N/A  . Years of Education: N/A   Occupational History  . Not on file.   Social History Main Topics  . Smoking status: Never Smoker   . Smokeless tobacco: Never Used  . Alcohol Use: No  . Drug Use: No  . Sexual Activity: Not on file   Other Topics Concern  . Not on file   Social History Narrative  . No narrative on file    Family History  Problem Relation Age of Onset  . Hypertension Mother   . Diabetes Father   . Heart attack Father   . Heart attack Brother     Past Medical History  Diagnosis Date  . Hypertension   . CAD (coronary artery disease)     previous stent  . Hyperlipemia   . Abdominal discomfort     "due to medication intolerance"  Past Surgical History  Procedure Laterality Date  . Angioplasty  03/01/2002    cutting balloon mid RCA  . Coronary stent placement  03/15/2005    distal RCA  . Nephrectomy    . Back surgery    . Cardiac catheterization  01/26/2007    mild diffuse CAD  . Cardiac catheterization  10/23/2009    nonobstructive CAD,60% prox RCA,50% prox LAD, 50% mid ramus    Current Outpatient Prescriptions  Medication Sig Dispense Refill  . amphetamine-dextroamphetamine (ADDERALL) 20 MG tablet Take 20 mg by mouth daily.      Marland Kitchen aspirin 81 MG tablet Take 81 mg by mouth daily.      Marland Kitchen atenolol (TENORMIN) 25 MG tablet Take 1 tablet (25mg ) in the morning and 2 tablets (50mg ) in the evening.  90 tablet  5  . fexofenadine (ALLEGRA) 180 MG tablet Take 180 mg by mouth daily  as needed.       Marland Kitchen FLUoxetine (PROZAC) 20 MG capsule Take 2 capsules (40 mg total) by mouth daily.  60 capsule  3  . hydrochlorothiazide (MICROZIDE) 12.5 MG capsule Take 1 capsule (12.5 mg total) by mouth daily.  30 capsule  6  . levothyroxine (SYNTHROID, LEVOTHROID) 50 MCG tablet Take 50 mcg by mouth daily before breakfast.      . lisinopril (PRINIVIL,ZESTRIL) 20 MG tablet Take 10-20 mg by mouth. 20mg  QAM, 10mg  QHS      . morphine (MSIR) 15 MG tablet Take 15 mg by mouth every 6 (six) hours as needed for pain.      . potassium chloride (K-DUR,KLOR-CON) 10 MEQ tablet Take 10 mEq by mouth as needed (only takes if she dont eat potassium rich foods).       . diazepam (VALIUM) 5 MG tablet Take 5 mg by mouth every 12 (twelve) hours as needed.       . ondansetron (ZOFRAN) 8 MG tablet Take 1 tablet (8 mg total) by mouth every 12 (twelve) hours as needed for nausea.  10 tablet  0  . traMADol (ULTRAM) 50 MG tablet Take 50-100 mg by mouth every 6 (six) hours as needed.        No current facility-administered medications for this visit.    Allergies as of 09/10/2013 - Review Complete 09/10/2013  Allergen Reaction Noted  . Cardizem [diltiazem hcl]  07/25/2012  . Codeine  07/25/2012  . Coreg [carvedilol]  06/13/2013  . Demerol [meperidine]  07/25/2012  . Erythromycin  07/25/2012  . Inderal [propranolol]  07/25/2012  . Lisinopril Cough 06/13/2013  . Procardia [nifedipine]  06/13/2013  . Wellbutrin [bupropion]  06/13/2013  . Zoloft [sertraline hcl]  07/25/2012    Vitals: BP 134/65  Pulse 73  Resp 17  Ht 5\' 3"  (1.6 m)  Wt 174 lb (78.926 kg)  BMI 30.83 kg/m2 Last Weight:  Wt Readings from Last 1 Encounters:  09/10/13 174 lb (78.926 kg)   Last Height:   Ht Readings from Last 1 Encounters:  09/10/13 5\' 3"  (1.6 m)    Physical exam:  General: The patient is awake, alert and appears not in acute distress. The patient is well groomed. Head: Normocephalic, atraumatic. Neck is supple. Mallampati  3 , neck circumference: 15 , no nasal deviation.  Cardiovascular:  Regular rate and rhythm , without  murmurs or carotid bruit, and without distended neck veins. Respiratory: Lungs are clear to auscultation. Skin:  Without evidence of edema, or rash Trunk: BMI is  elevated .  Neurologic exam : The  patient is awake and alert, oriented to place and time.  Memory subjective   described as intact. There is a normal attention span & concentration ability.  Speech is fluent with dysphonia , not  Aphasia. Monotonous non melodic voice and speech.   Mood and affect are depressed.   Cranial nerves: Pupils are equal and briskly reactive to light. Funduscopic exam without   evidence of pallor or edema. Beginning cataracts. Extraocular movements  in vertical and horizontal planes intact and without nystagmus. Visual fields by finger perimetry are intact. Hearing to finger rub intact.  Facial sensation intact to fine touch. Facial motor expression is masked - but  strength is symmetric and tongue and uvula move midline.  Motor exam:   Normal tone and  muscle bulk and symmetric strength in all extremities.  Sensory:  Fine touch, pinprick and vibration were tested in all extremities. Proprioception is normal.  Coordination: Rapid alternating movements in the fingers/hands is tested and normal. Finger-to-nose maneuver tested and normal without evidence of ataxia, dysmetria or tremor.  Gait and station: Patient walks without assistive device , but she needs assistance  stool climb up to the exam table.  Strength within normal limits. Stance is stable and normal. Tandem gait is fragmented. Romberg testing is  Positive . Deep tendon reflexes: in the  upper and lower extremities are symmetric and intact. Babinski maneuver response is downgoing.   Assessment:  After physical and neurologic examination, review of laboratory studies, imaging, neurophysiology testing and pre-existing records, assessment is  1)  chronic back  Pain, joint pain - treated by PCP and Dr Ethelene Hal, pain management . No narcotics prescribed from [facility] .  2) weight gain. Diabetes.  3) psychomotor slowing - may be narcotic induced. 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     Plan:  Treatment plan and additional workup : the patient had reportedly no sleep disordered breathing of significance in 2006. meanwhile her weight has increased, the narcotics may cause central apneas, Diazepam may worsen OSA, and she is dependent on pain medications. I plan to order  a SPLIT study for apnea evaluation.  I willl prescribe Adderall while Dr. Ihor Dow is on maternity leave.   I will not take over any aspect of psychiatric or  pain mangement.

## 2013-09-13 ENCOUNTER — Telehealth: Payer: Self-pay | Admitting: Cardiovascular Disease

## 2013-09-13 ENCOUNTER — Inpatient Hospital Stay: Admission: RE | Admit: 2013-09-13 | Payer: Medicare Other | Source: Ambulatory Visit

## 2013-09-13 MED ORDER — LISINOPRIL 20 MG PO TABS: 10.0000 mg | ORAL_TABLET | ORAL | Status: DC

## 2013-09-13 NOTE — Telephone Encounter (Signed)
Returned call.  Left message that Rx corrected and sent to [facility] and to call back before 4pm if questions.

## 2013-09-13 NOTE — Telephone Encounter (Signed)
Needs to get lisinopril 20 mg corrected  Says she takes 20 mg in am and 10 mg at night  Her rx is not lasting since started taking 10 at night  Please call

## 2013-09-13 NOTE — Telephone Encounter (Signed)
LMTCB

## 2013-09-17 ENCOUNTER — Telehealth: Payer: Self-pay | Admitting: Cardiovascular Disease

## 2013-09-17 NOTE — Telephone Encounter (Signed)
Cough is a common SE of lisinopril.  Message forwarded to Dr. Royann Shivers for further instructions.

## 2013-09-17 NOTE — Telephone Encounter (Signed)
Having problem with the Lisinipril,its making her cough so much,

## 2013-09-21 NOTE — Telephone Encounter (Signed)
Call to pt and asked if she has checked BP.  Pt denied checking BP.  Stated she has been busy doing some housekeeping things.  Pt advised to call back tomorrow morning w/ AM BP reading.  Pt verbalized understanding and agreed w/ plan.

## 2013-09-21 NOTE — Telephone Encounter (Signed)
Returned call.  Pt stated she called about her atenolol refill. Stated she gets 45 pills and takes 3 pills a day.  Stated she is running out early and they won't let her refill it.  Pt requested a prescription w/ more pills for the month.  Pt informed Dr. Salena Saner sent a refill on 10.13.14 that was received by the pharmacy on 10.15.14 for #90 w/ 5 refills.  Pt verbalized understanding and stated she will call pharmacy.  Pt also informed RN returned call r/t call about cough and lisinopril.  Pt informed pt per instructions by MD.  Pt verbalized understanding and stated she has been taking 1/2 lisinopril in AM and 1/2 in PM.  RN asked pt for BP readings and pt stated she just got a new monitor and hasn't checked it, but feels good.  Pt will call back w/ BP reading for today.

## 2013-09-21 NOTE — Telephone Encounter (Signed)
I believe she has been on this medication a long time already. Could the cough be from something else? Can switch to irbesartan 300 mg daily if she needs to try something else.

## 2013-10-01 ENCOUNTER — Telehealth: Payer: Self-pay

## 2013-10-01 NOTE — Telephone Encounter (Signed)
I called [name] at [facility] after reviewing medication with patient. The Adderall is to be taken half tab in a.m. and half tab at noon.

## 2013-10-18 ENCOUNTER — Telehealth: Payer: Self-pay | Admitting: Neurology

## 2013-10-18 NOTE — Telephone Encounter (Signed)
We do not prescribe BP med.  That Rx is written by Thurmon Fair, MD according to patients chart.  I called the patient back.  She called [place] by mistake.  She will get refills from her other provider.

## 2013-10-19 DIAGNOSIS — M1710 Unilateral primary osteoarthritis, unspecified knee: Secondary | ICD-10-CM | POA: Diagnosis not present

## 2013-10-19 DIAGNOSIS — M25519 Pain in unspecified shoulder: Secondary | ICD-10-CM | POA: Diagnosis not present

## 2013-10-20 DIAGNOSIS — M545 Low back pain: Secondary | ICD-10-CM | POA: Diagnosis not present

## 2013-11-04 DIAGNOSIS — E785 Hyperlipidemia, unspecified: Secondary | ICD-10-CM | POA: Diagnosis not present

## 2013-11-04 DIAGNOSIS — R5381 Other malaise: Secondary | ICD-10-CM | POA: Diagnosis not present

## 2013-11-04 DIAGNOSIS — F329 Major depressive disorder, single episode, unspecified: Secondary | ICD-10-CM | POA: Diagnosis not present

## 2013-11-04 DIAGNOSIS — R7309 Other abnormal glucose: Secondary | ICD-10-CM | POA: Diagnosis not present

## 2013-11-05 ENCOUNTER — Other Ambulatory Visit: Payer: Self-pay | Admitting: Neurology

## 2013-11-05 DIAGNOSIS — G4710 Hypersomnia, unspecified: Secondary | ICD-10-CM

## 2013-11-05 DIAGNOSIS — G894 Chronic pain syndrome: Secondary | ICD-10-CM

## 2013-11-05 DIAGNOSIS — E669 Obesity, unspecified: Secondary | ICD-10-CM

## 2013-11-05 MED ORDER — AMPHETAMINE-DEXTROAMPHETAMINE 20 MG PO TABS: ORAL_TABLET | ORAL | Status: DC

## 2013-11-05 NOTE — Telephone Encounter (Signed)
Called patient and left message that her prescription was ready to be picked up at the front desk and if she has any other problems, questions or concerns to call the office.

## 2013-11-05 NOTE — Telephone Encounter (Signed)
Patient called requesting refill of Adderall sent to her [facility] pharmacy.

## 2013-11-23 DIAGNOSIS — M25569 Pain in unspecified knee: Secondary | ICD-10-CM | POA: Diagnosis not present

## 2013-11-29 DIAGNOSIS — H25049 Posterior subcapsular polar age-related cataract, unspecified eye: Secondary | ICD-10-CM | POA: Diagnosis not present

## 2013-11-29 DIAGNOSIS — H04129 Dry eye syndrome of unspecified lacrimal gland: Secondary | ICD-10-CM | POA: Diagnosis not present

## 2013-11-29 DIAGNOSIS — H2510 Age-related nuclear cataract, unspecified eye: Secondary | ICD-10-CM | POA: Diagnosis not present

## 2013-11-29 DIAGNOSIS — H25019 Cortical age-related cataract, unspecified eye: Secondary | ICD-10-CM | POA: Diagnosis not present

## 2013-11-29 DIAGNOSIS — H01009 Unspecified blepharitis unspecified eye, unspecified eyelid: Secondary | ICD-10-CM | POA: Diagnosis not present

## 2013-12-21 DIAGNOSIS — M1710 Unilateral primary osteoarthritis, unspecified knee: Secondary | ICD-10-CM | POA: Diagnosis not present

## 2013-12-23 ENCOUNTER — Other Ambulatory Visit: Payer: Self-pay | Admitting: Neurology

## 2013-12-23 DIAGNOSIS — E669 Obesity, unspecified: Secondary | ICD-10-CM

## 2013-12-23 DIAGNOSIS — G4710 Hypersomnia, unspecified: Secondary | ICD-10-CM

## 2013-12-23 DIAGNOSIS — G894 Chronic pain syndrome: Secondary | ICD-10-CM

## 2013-12-23 MED ORDER — AMPHETAMINE-DEXTROAMPHETAMINE 20 MG PO TABS: ORAL_TABLET | ORAL | Status: DC

## 2013-12-23 NOTE — Telephone Encounter (Signed)
Pt called stating she needs her Adderall Rx refilled.  Please call when ready.  Thank you

## 2013-12-23 NOTE — Telephone Encounter (Signed)
Dr Dohmeier is out of the office.  Forwarding to [facility]  

## 2013-12-27 NOTE — Telephone Encounter (Signed)
Called patient and left message about her Rx being ready to be picked up at the front desk and if she has any other problems, questions or concerns to call the office.

## 2013-12-28 DIAGNOSIS — M1710 Unilateral primary osteoarthritis, unspecified knee: Secondary | ICD-10-CM | POA: Diagnosis not present

## 2013-12-30 ENCOUNTER — Telehealth: Payer: Self-pay | Admitting: Cardiovascular Disease

## 2013-12-30 ENCOUNTER — Other Ambulatory Visit: Payer: Self-pay | Admitting: Cardiovascular Disease

## 2013-12-30 NOTE — Telephone Encounter (Signed)
Please call-eyes are so dry,wondering if her Lisinopril might be causing this?

## 2013-12-30 NOTE — Telephone Encounter (Signed)
Returned call.  Pt stated she had an appt to have her cataracts removed and her eyes were so dry he gave her prescriptions to take for 2 weeks.  Pt c/o her eyes being so dry and wanted to know if the lisinopril is causing it.  Pt informed eye swelling is a SE of lisinopril and should be addressed immediately.  Pt informed her dry eyes are likely r/t taking Allegra and it being dry b/c of winter.  Pt verbalized understanding and concerned she may not be able to have her surgery.  Pt advised to contact her eye doctor to let him know her eyes are still dry b/c he may want to adjust her meds or see her sooner.  Pt verbalized understanding and agreed w/ plan.

## 2014-01-04 DIAGNOSIS — M1710 Unilateral primary osteoarthritis, unspecified knee: Secondary | ICD-10-CM | POA: Diagnosis not present

## 2014-01-17 DIAGNOSIS — H01009 Unspecified blepharitis unspecified eye, unspecified eyelid: Secondary | ICD-10-CM | POA: Diagnosis not present

## 2014-01-17 DIAGNOSIS — H25019 Cortical age-related cataract, unspecified eye: Secondary | ICD-10-CM | POA: Diagnosis not present

## 2014-01-17 DIAGNOSIS — H04129 Dry eye syndrome of unspecified lacrimal gland: Secondary | ICD-10-CM | POA: Diagnosis not present

## 2014-01-17 DIAGNOSIS — H2510 Age-related nuclear cataract, unspecified eye: Secondary | ICD-10-CM | POA: Diagnosis not present

## 2014-02-04 ENCOUNTER — Other Ambulatory Visit: Payer: Self-pay | Admitting: Cardiovascular Disease

## 2014-02-04 ENCOUNTER — Other Ambulatory Visit: Payer: Self-pay | Admitting: Neurology

## 2014-02-04 ENCOUNTER — Telehealth: Payer: Self-pay | Admitting: Cardiovascular Disease

## 2014-02-04 DIAGNOSIS — E669 Obesity, unspecified: Secondary | ICD-10-CM

## 2014-02-04 DIAGNOSIS — G4710 Hypersomnia, unspecified: Secondary | ICD-10-CM

## 2014-02-04 DIAGNOSIS — G894 Chronic pain syndrome: Secondary | ICD-10-CM

## 2014-02-04 MED ORDER — AMPHETAMINE-DEXTROAMPHETAMINE 20 MG PO TABS: ORAL_TABLET | ORAL | Status: DC

## 2014-02-04 NOTE — Telephone Encounter (Signed)
Go ahead and refill please.

## 2014-02-04 NOTE — Telephone Encounter (Signed)
Refilled in the refill pool.  #60 no refills.

## 2014-02-04 NOTE — Telephone Encounter (Signed)
She wants to be sure her generic Prozac is called in today. She will not have enough for the weekend. Please call to [phone]-[phone].

## 2014-02-04 NOTE — Telephone Encounter (Signed)
Dr Dohmeier is out of the office.  Forwarding to [facility]  

## 2014-02-04 NOTE — Telephone Encounter (Signed)
Message sent to Dr. Royann Shiversroitoru for approval by Ian Bushmanhelley Truitt, CMA.

## 2014-02-07 NOTE — Telephone Encounter (Signed)
Called patient to inform her that her Rx was ready to be picked up at the front desk and if she has any other problems, questions or concerns to call the office. Patient verbalized understanding. °

## 2014-02-16 DIAGNOSIS — G8929 Other chronic pain: Secondary | ICD-10-CM | POA: Diagnosis not present

## 2014-02-16 DIAGNOSIS — E785 Hyperlipidemia, unspecified: Secondary | ICD-10-CM | POA: Diagnosis not present

## 2014-02-16 DIAGNOSIS — E039 Hypothyroidism, unspecified: Secondary | ICD-10-CM | POA: Diagnosis not present

## 2014-02-16 DIAGNOSIS — E119 Type 2 diabetes mellitus without complications: Secondary | ICD-10-CM | POA: Diagnosis not present

## 2014-02-24 DIAGNOSIS — M1710 Unilateral primary osteoarthritis, unspecified knee: Secondary | ICD-10-CM | POA: Diagnosis not present

## 2014-03-01 DIAGNOSIS — R262 Difficulty in walking, not elsewhere classified: Secondary | ICD-10-CM | POA: Diagnosis not present

## 2014-03-01 DIAGNOSIS — M25569 Pain in unspecified knee: Secondary | ICD-10-CM | POA: Diagnosis not present

## 2014-03-01 DIAGNOSIS — M6281 Muscle weakness (generalized): Secondary | ICD-10-CM | POA: Diagnosis not present

## 2014-03-01 DIAGNOSIS — M545 Low back pain, unspecified: Secondary | ICD-10-CM | POA: Diagnosis not present

## 2014-03-02 ENCOUNTER — Telehealth: Payer: Self-pay | Admitting: Cardiovascular Disease

## 2014-03-02 NOTE — Telephone Encounter (Signed)
Her ortho has sent her to PT for water therapy for help with her muscle cramping and to help build her strength.  But, she is concerned about her heart and water temp of 100 degrees.  Discussed with Nada BoozerLaura Ingold, PA - she should be fine but if she experiences dizziness or lightheadedness to get out of the water because it could make her BP go up.  Voiced understanding.  States she has taken a hot shower in the past which made her feel very nauseous and had to get out quickly and she is afraid this could make her feel the same way.  Assured her the therapist will be there with her and she will help her get out if she has any problems.  Or, she can call ahead and discuss her concerns.  Voiced understanding and will probably go and see if she has any problems.

## 2014-03-02 NOTE — Telephone Encounter (Signed)
Pt called and wants to know if she can take water therapy because the temperature of the water is 100 degrees?She says she thought it might too warm for he,she does not want to pass out.

## 2014-03-03 DIAGNOSIS — M545 Low back pain, unspecified: Secondary | ICD-10-CM | POA: Diagnosis not present

## 2014-03-03 DIAGNOSIS — M25569 Pain in unspecified knee: Secondary | ICD-10-CM | POA: Diagnosis not present

## 2014-03-03 DIAGNOSIS — M6281 Muscle weakness (generalized): Secondary | ICD-10-CM | POA: Diagnosis not present

## 2014-03-03 DIAGNOSIS — R262 Difficulty in walking, not elsewhere classified: Secondary | ICD-10-CM | POA: Diagnosis not present

## 2014-03-17 ENCOUNTER — Other Ambulatory Visit: Payer: Self-pay | Admitting: Cardiovascular Disease

## 2014-03-17 NOTE — Telephone Encounter (Signed)
clsoed encounter °

## 2014-03-21 NOTE — Telephone Encounter (Signed)
Rx was sent to pharmacy electronically. 

## 2014-03-21 NOTE — Telephone Encounter (Signed)
Need refill on her Prozac 20mg  #60.

## 2014-03-23 ENCOUNTER — Other Ambulatory Visit: Payer: Self-pay | Admitting: Neurology

## 2014-03-23 DIAGNOSIS — E669 Obesity, unspecified: Secondary | ICD-10-CM

## 2014-03-23 DIAGNOSIS — G4710 Hypersomnia, unspecified: Secondary | ICD-10-CM

## 2014-03-23 DIAGNOSIS — G894 Chronic pain syndrome: Secondary | ICD-10-CM

## 2014-03-23 MED ORDER — AMPHETAMINE-DEXTROAMPHETAMINE 20 MG PO TABS: ORAL_TABLET | ORAL | Status: DC

## 2014-03-23 NOTE — Telephone Encounter (Signed)
Patient needs written Rx for Adderall--mail to patient please --thank you

## 2014-03-23 NOTE — Telephone Encounter (Signed)
Request forwarded to provider for approval  

## 2014-03-24 NOTE — Telephone Encounter (Signed)
Called pt to inform her that her Rx was being mailed out today and if she has any other problems, questions or concerns to call the office. Pt verbalized understanding. °

## 2014-03-28 ENCOUNTER — Ambulatory Visit
Admission: RE | Admit: 2014-03-28 | Discharge: 2014-03-28 | Disposition: A | Discharge status: Home / Self Care | Payer: Medicare Other | Source: Ambulatory Visit | Attending: Chiropractic Medicine | Admitting: Chiropractic Medicine

## 2014-03-28 ENCOUNTER — Other Ambulatory Visit: Payer: Self-pay | Admitting: Chiropractic Medicine

## 2014-03-28 DIAGNOSIS — M545 Low back pain, unspecified: Secondary | ICD-10-CM

## 2014-03-28 DIAGNOSIS — M5137 Other intervertebral disc degeneration, lumbosacral region: Secondary | ICD-10-CM | POA: Diagnosis not present

## 2014-03-28 DIAGNOSIS — M542 Cervicalgia: Secondary | ICD-10-CM

## 2014-03-28 DIAGNOSIS — M5030 Other cervical disc degeneration, unspecified cervical region: Secondary | ICD-10-CM | POA: Diagnosis not present

## 2014-03-28 DIAGNOSIS — M47817 Spondylosis without myelopathy or radiculopathy, lumbosacral region: Secondary | ICD-10-CM | POA: Diagnosis not present

## 2014-04-04 DIAGNOSIS — H01009 Unspecified blepharitis unspecified eye, unspecified eyelid: Secondary | ICD-10-CM | POA: Diagnosis not present

## 2014-04-04 DIAGNOSIS — H25019 Cortical age-related cataract, unspecified eye: Secondary | ICD-10-CM | POA: Diagnosis not present

## 2014-04-04 DIAGNOSIS — H04129 Dry eye syndrome of unspecified lacrimal gland: Secondary | ICD-10-CM | POA: Diagnosis not present

## 2014-04-04 DIAGNOSIS — H2510 Age-related nuclear cataract, unspecified eye: Secondary | ICD-10-CM | POA: Diagnosis not present

## 2014-04-25 ENCOUNTER — Other Ambulatory Visit: Payer: Self-pay | Admitting: Neurology

## 2014-04-25 DIAGNOSIS — E669 Obesity, unspecified: Secondary | ICD-10-CM

## 2014-04-25 DIAGNOSIS — G4710 Hypersomnia, unspecified: Secondary | ICD-10-CM

## 2014-04-25 DIAGNOSIS — M25519 Pain in unspecified shoulder: Secondary | ICD-10-CM | POA: Diagnosis not present

## 2014-04-25 DIAGNOSIS — G894 Chronic pain syndrome: Secondary | ICD-10-CM

## 2014-04-25 MED ORDER — AMPHETAMINE-DEXTROAMPHETAMINE 20 MG PO TABS: ORAL_TABLET | ORAL | Status: DC

## 2014-04-25 NOTE — Telephone Encounter (Signed)
Patient requesting refill script of Adderall to be mailed to her.

## 2014-04-25 NOTE — Telephone Encounter (Signed)
Request forwarded to provider for approval  

## 2014-04-25 NOTE — Telephone Encounter (Signed)
Pt's Rx was mailed out today. °

## 2014-05-16 DIAGNOSIS — H11829 Conjunctivochalasis, unspecified eye: Secondary | ICD-10-CM | POA: Diagnosis not present

## 2014-05-16 DIAGNOSIS — H18419 Arcus senilis, unspecified eye: Secondary | ICD-10-CM | POA: Diagnosis not present

## 2014-05-16 DIAGNOSIS — H2510 Age-related nuclear cataract, unspecified eye: Secondary | ICD-10-CM | POA: Diagnosis not present

## 2014-05-16 DIAGNOSIS — H25019 Cortical age-related cataract, unspecified eye: Secondary | ICD-10-CM | POA: Diagnosis not present

## 2014-05-16 DIAGNOSIS — H25049 Posterior subcapsular polar age-related cataract, unspecified eye: Secondary | ICD-10-CM | POA: Diagnosis not present

## 2014-06-08 ENCOUNTER — Telehealth: Payer: Self-pay | Admitting: Cardiovascular Disease

## 2014-06-08 NOTE — Telephone Encounter (Signed)
Mrs. [name] states that her husband is going to bring some x-rays and DVDs that she would like for Dr. Salena Saner to look at concerning her carotid arteries and her aorta.Marland Kitchen.  He is also gong to bring the written interpretation.  Please let patient know if we have a way to view these films and the DVDs.

## 2014-06-08 NOTE — Telephone Encounter (Signed)
Pt. Informed that yes we can look at all film types

## 2014-06-09 ENCOUNTER — Other Ambulatory Visit: Payer: Self-pay | Admitting: Neurology

## 2014-06-09 DIAGNOSIS — G894 Chronic pain syndrome: Secondary | ICD-10-CM

## 2014-06-09 DIAGNOSIS — G4710 Hypersomnia, unspecified: Secondary | ICD-10-CM

## 2014-06-09 DIAGNOSIS — E669 Obesity, unspecified: Secondary | ICD-10-CM

## 2014-06-09 MED ORDER — AMPHETAMINE-DEXTROAMPHETAMINE 20 MG PO TABS: ORAL_TABLET | ORAL | Status: DC

## 2014-06-09 NOTE — Telephone Encounter (Signed)
Request forwarded to provider for approval  

## 2014-06-09 NOTE — Telephone Encounter (Signed)
Patient requesting refills of Adderall be mailed out to her.

## 2014-06-09 NOTE — Telephone Encounter (Signed)
Pt's Rx was mailed out today, per Jessica, CPhT. °

## 2014-06-14 ENCOUNTER — Telehealth: Payer: Self-pay | Admitting: *Deleted

## 2014-06-14 DIAGNOSIS — E782 Mixed hyperlipidemia: Secondary | ICD-10-CM

## 2014-06-14 DIAGNOSIS — Z79899 Other long term (current) drug therapy: Secondary | ICD-10-CM

## 2014-06-14 MED ORDER — EZETIMIBE-SIMVASTATIN 10-20 MG PO TABS: 1.0000 | ORAL_TABLET | Freq: Every day | ORAL | Status: DC

## 2014-06-14 NOTE — Telephone Encounter (Signed)
Message copied by Vita BarleyLASSITER, Linh Johannes A on Tue Jun 14, 2014  2:53 PM ------      Message from: Thurmon FairROITORU, MIHAI      Created: Tue Jun 14, 2014 10:19 AM       The abnormalities in the aorta (calcifications of the abdominal aorta without aneurysm) are not immediately a problem, are a marker of atherosclerosis. It is highly likely that this is occurring in other arteries in the body including for example the coronaries and carotids, therefore increasing the risk of stroke or future heart attack. The single most important intervention that can help reduce the risk of these complications is cholesterol-lowering with a statin. Also important his treatment of high blood pressure, avoidance of cigarettes, regular exercise and maintaining a healthy diet and weight.      I think she has been reluctant to ever take a statin in the past. Her cholesterol is quite high and I would recommend a statin if she is willing to reconsider. ------

## 2014-06-14 NOTE — Telephone Encounter (Signed)
Vytorin 10/20, one at bedtime daily,  Rf 11, recheck lipids/CMET in 3 months, please

## 2014-06-14 NOTE — Telephone Encounter (Signed)
States she can hardly walk due to ortho problems, currently using a rolling chair.  States she can not take Lipitor but is will to try Vytorin.  Will send this info to Dr. Salena Saner to get an order for the Vytorin and strength.  Will contact patient when this is done. Voiced understanding.

## 2014-06-14 NOTE — Telephone Encounter (Signed)
Patient notified to start Vytorin 10/20mg  qhs and have labs rechecked in 3 months.  Patient voiced understanding.  Samples given along with lab order.

## 2014-06-16 DIAGNOSIS — M899 Disorder of bone, unspecified: Secondary | ICD-10-CM | POA: Diagnosis not present

## 2014-06-16 DIAGNOSIS — M949 Disorder of cartilage, unspecified: Secondary | ICD-10-CM | POA: Diagnosis not present

## 2014-06-16 DIAGNOSIS — N390 Urinary tract infection, site not specified: Secondary | ICD-10-CM | POA: Diagnosis not present

## 2014-06-22 DIAGNOSIS — H2510 Age-related nuclear cataract, unspecified eye: Secondary | ICD-10-CM | POA: Diagnosis not present

## 2014-06-22 DIAGNOSIS — H269 Unspecified cataract: Secondary | ICD-10-CM | POA: Diagnosis not present

## 2014-06-24 DIAGNOSIS — T8529XA Other mechanical complication of intraocular lens, initial encounter: Secondary | ICD-10-CM | POA: Diagnosis not present

## 2014-06-24 DIAGNOSIS — H43819 Vitreous degeneration, unspecified eye: Secondary | ICD-10-CM | POA: Diagnosis not present

## 2014-06-24 DIAGNOSIS — H18239 Secondary corneal edema, unspecified eye: Secondary | ICD-10-CM | POA: Diagnosis not present

## 2014-06-24 DIAGNOSIS — Z961 Presence of intraocular lens: Secondary | ICD-10-CM | POA: Diagnosis not present

## 2014-06-29 DIAGNOSIS — H43819 Vitreous degeneration, unspecified eye: Secondary | ICD-10-CM | POA: Diagnosis not present

## 2014-06-29 DIAGNOSIS — H18239 Secondary corneal edema, unspecified eye: Secondary | ICD-10-CM | POA: Diagnosis not present

## 2014-06-29 DIAGNOSIS — Z961 Presence of intraocular lens: Secondary | ICD-10-CM | POA: Diagnosis not present

## 2014-06-29 DIAGNOSIS — H27139 Posterior dislocation of lens, unspecified eye: Secondary | ICD-10-CM | POA: Diagnosis not present

## 2014-07-07 DIAGNOSIS — M19019 Primary osteoarthritis, unspecified shoulder: Secondary | ICD-10-CM | POA: Diagnosis not present

## 2014-07-07 DIAGNOSIS — M25569 Pain in unspecified knee: Secondary | ICD-10-CM | POA: Diagnosis not present

## 2014-07-12 ENCOUNTER — Other Ambulatory Visit: Payer: Self-pay | Admitting: Orthopedic Surgery

## 2014-07-12 DIAGNOSIS — M25511 Pain in right shoulder: Secondary | ICD-10-CM

## 2014-07-13 DIAGNOSIS — H43399 Other vitreous opacities, unspecified eye: Secondary | ICD-10-CM | POA: Diagnosis not present

## 2014-07-13 DIAGNOSIS — H27139 Posterior dislocation of lens, unspecified eye: Secondary | ICD-10-CM | POA: Diagnosis not present

## 2014-07-14 ENCOUNTER — Ambulatory Visit
Admission: RE | Admit: 2014-07-14 | Discharge: 2014-07-14 | Disposition: A | Discharge status: Home / Self Care | Payer: Medicare Other | Source: Ambulatory Visit | Attending: Orthopedic Surgery | Admitting: Orthopedic Surgery

## 2014-07-14 DIAGNOSIS — M25511 Pain in right shoulder: Secondary | ICD-10-CM

## 2014-07-14 DIAGNOSIS — M19019 Primary osteoarthritis, unspecified shoulder: Secondary | ICD-10-CM | POA: Diagnosis not present

## 2014-07-28 DIAGNOSIS — M25519 Pain in unspecified shoulder: Secondary | ICD-10-CM | POA: Diagnosis not present

## 2014-07-28 DIAGNOSIS — M25569 Pain in unspecified knee: Secondary | ICD-10-CM | POA: Diagnosis not present

## 2014-07-28 DIAGNOSIS — M1710 Unilateral primary osteoarthritis, unspecified knee: Secondary | ICD-10-CM | POA: Diagnosis not present

## 2014-08-08 ENCOUNTER — Other Ambulatory Visit: Payer: Self-pay | Admitting: Neurology

## 2014-08-08 DIAGNOSIS — G4710 Hypersomnia, unspecified: Secondary | ICD-10-CM

## 2014-08-08 DIAGNOSIS — G894 Chronic pain syndrome: Secondary | ICD-10-CM

## 2014-08-08 DIAGNOSIS — H2510 Age-related nuclear cataract, unspecified eye: Secondary | ICD-10-CM | POA: Diagnosis not present

## 2014-08-08 DIAGNOSIS — E669 Obesity, unspecified: Secondary | ICD-10-CM

## 2014-08-08 NOTE — Telephone Encounter (Signed)
Patient requesting Adderall refill, please call when ready for pick up.

## 2014-08-09 MED ORDER — AMPHETAMINE-DEXTROAMPHETAMINE 20 MG PO TABS: ORAL_TABLET | ORAL | Status: DC

## 2014-08-09 NOTE — Telephone Encounter (Signed)
Called pt to inform her that her Rx was ready to be picked up at the front desk and if she has any other problems, questions or concerns to call the office. Pt verbalized understanding. °

## 2014-09-01 DIAGNOSIS — M25511 Pain in right shoulder: Secondary | ICD-10-CM | POA: Diagnosis not present

## 2014-09-01 DIAGNOSIS — M170 Bilateral primary osteoarthritis of knee: Secondary | ICD-10-CM | POA: Diagnosis not present

## 2014-09-07 DIAGNOSIS — H2512 Age-related nuclear cataract, left eye: Secondary | ICD-10-CM | POA: Diagnosis not present

## 2014-09-08 DIAGNOSIS — M170 Bilateral primary osteoarthritis of knee: Secondary | ICD-10-CM | POA: Diagnosis not present

## 2014-09-15 DIAGNOSIS — M1712 Unilateral primary osteoarthritis, left knee: Secondary | ICD-10-CM | POA: Diagnosis not present

## 2014-09-15 DIAGNOSIS — M19011 Primary osteoarthritis, right shoulder: Secondary | ICD-10-CM | POA: Diagnosis not present

## 2014-09-15 DIAGNOSIS — M1711 Unilateral primary osteoarthritis, right knee: Secondary | ICD-10-CM | POA: Diagnosis not present

## 2014-09-26 ENCOUNTER — Other Ambulatory Visit: Payer: Self-pay | Admitting: Cardiovascular Disease

## 2014-09-26 ENCOUNTER — Encounter: Payer: Self-pay | Admitting: Neurology

## 2014-09-26 ENCOUNTER — Telehealth: Payer: Self-pay | Admitting: Neurology

## 2014-09-26 DIAGNOSIS — E669 Obesity, unspecified: Secondary | ICD-10-CM

## 2014-09-26 DIAGNOSIS — G4710 Hypersomnia, unspecified: Secondary | ICD-10-CM

## 2014-09-26 DIAGNOSIS — G894 Chronic pain syndrome: Secondary | ICD-10-CM

## 2014-09-26 MED ORDER — AMPHETAMINE-DEXTROAMPHETAMINE 20 MG PO TABS: ORAL_TABLET | ORAL | Status: DC

## 2014-09-26 NOTE — Telephone Encounter (Signed)
Patient is calling to get a written Rx for Adderall. Please call patient when ready to pick up. Thank you.

## 2014-09-26 NOTE — Telephone Encounter (Signed)
I spoke with the patient who will call us back to schedule annual appt

## 2014-09-26 NOTE — Telephone Encounter (Signed)
Patent has scheduled appt.   Rx request entered and forwarded to provider for approval .

## 2014-09-27 NOTE — Telephone Encounter (Addendum)
I called the patient to let them know their Rx for Adderall was ready for pickup. Patient was instructed to bring Photo ID.  Patients husband will be picking up the medication.

## 2014-09-29 DIAGNOSIS — M170 Bilateral primary osteoarthritis of knee: Secondary | ICD-10-CM | POA: Diagnosis not present

## 2014-10-06 DIAGNOSIS — M170 Bilateral primary osteoarthritis of knee: Secondary | ICD-10-CM | POA: Diagnosis not present

## 2014-10-17 ENCOUNTER — Ambulatory Visit: Payer: Medicare Other | Admitting: Neurology

## 2014-10-25 ENCOUNTER — Encounter: Payer: Self-pay | Admitting: Neurology

## 2014-10-26 DIAGNOSIS — G894 Chronic pain syndrome: Secondary | ICD-10-CM | POA: Diagnosis not present

## 2014-10-26 DIAGNOSIS — M961 Postlaminectomy syndrome, not elsewhere classified: Secondary | ICD-10-CM | POA: Diagnosis not present

## 2014-11-02 DIAGNOSIS — E039 Hypothyroidism, unspecified: Secondary | ICD-10-CM | POA: Diagnosis not present

## 2014-11-02 DIAGNOSIS — I10 Essential (primary) hypertension: Secondary | ICD-10-CM | POA: Diagnosis not present

## 2014-11-02 DIAGNOSIS — G8929 Other chronic pain: Secondary | ICD-10-CM | POA: Diagnosis not present

## 2014-11-02 DIAGNOSIS — R5382 Chronic fatigue, unspecified: Secondary | ICD-10-CM | POA: Diagnosis not present

## 2014-11-02 DIAGNOSIS — Z23 Encounter for immunization: Secondary | ICD-10-CM | POA: Diagnosis not present

## 2014-11-03 DIAGNOSIS — M25512 Pain in left shoulder: Secondary | ICD-10-CM | POA: Diagnosis not present

## 2014-11-03 DIAGNOSIS — M170 Bilateral primary osteoarthritis of knee: Secondary | ICD-10-CM | POA: Diagnosis not present

## 2014-11-03 DIAGNOSIS — M25511 Pain in right shoulder: Secondary | ICD-10-CM | POA: Diagnosis not present

## 2014-12-05 ENCOUNTER — Encounter: Payer: Self-pay | Admitting: Neurology

## 2014-12-05 ENCOUNTER — Ambulatory Visit (INDEPENDENT_AMBULATORY_CARE_PROVIDER_SITE_OTHER): Payer: Medicare Other | Admitting: Neurology

## 2014-12-05 DIAGNOSIS — G894 Chronic pain syndrome: Secondary | ICD-10-CM | POA: Diagnosis not present

## 2014-12-05 DIAGNOSIS — F450 Somatization disorder: Secondary | ICD-10-CM

## 2014-12-05 DIAGNOSIS — E669 Obesity, unspecified: Secondary | ICD-10-CM

## 2014-12-05 DIAGNOSIS — F329 Major depressive disorder, single episode, unspecified: Secondary | ICD-10-CM | POA: Diagnosis not present

## 2014-12-05 DIAGNOSIS — G4719 Other hypersomnia: Secondary | ICD-10-CM | POA: Diagnosis not present

## 2014-12-05 DIAGNOSIS — F444 Conversion disorder with motor symptom or deficit: Secondary | ICD-10-CM

## 2014-12-05 DIAGNOSIS — F1120 Opioid dependence, uncomplicated: Secondary | ICD-10-CM

## 2014-12-05 DIAGNOSIS — G4710 Hypersomnia, unspecified: Secondary | ICD-10-CM | POA: Diagnosis not present

## 2014-12-05 DIAGNOSIS — F32A Depression, unspecified: Secondary | ICD-10-CM

## 2014-12-05 HISTORY — DX: Opioid dependence, uncomplicated: F11.20

## 2014-12-05 HISTORY — DX: Other hypersomnia: G47.19

## 2014-12-05 MED ORDER — AMPHETAMINE-DEXTROAMPHETAMINE 20 MG PO TABS: ORAL_TABLET | ORAL | Status: DC

## 2014-12-05 NOTE — Progress Notes (Addendum)
[facility]   Provider:  Larey Seat, Williams D  Referring Provider: Gavin Pound, MD Primary Care Physician:  Gavin Pound, MD    HPI:  [name] is a 74 y.o. female , married, caucasian and right handed. She is  seen here  Upon referral by Dr. Orland Penman for  Evaluation of hypersomnia.       Mrs.  Williams recalled having seen me briefly in 2006-7 , and she reported that she was sick and fatigued at the time. She has been fatigued ever since she had an Randell Patient Virus infection in 1998 and she required hospitalization, spinal tap and had a diagnosis of viral meningitis in 2005  .  I had started her on Concerta in 4193 and she felt this helped her fatigue without causing cardiac or BP Problems. [name] has had meanwhile returned from [place],  where her husband's work had transferred the family for 7 years . She has not experienced any syncope,  but she states that she feels reasonably  little better since being on stimulants and PROZAC.  This was also given to control hot flashes, the previous medication tried for the same symptoms was Effexor,  but didn't work as well. He is still doing the very best on prozac and stimulants,  With careful monitoring of her cardiac CA- disease. She had a cardiac catheterization in 2008 she has documented coronary artery disease and had undergone a total of 9 angioplasties.  She is established mild was able cardiologist, Dr.Mihai  Croitoru  from [facility] heart [facility] vascular .   I had last seen [name] several years ago and she has had progressive problems with balance, gait stability, fluent movements. She has the longest list of medication allergies and an extremely long ROS.  She is depressed, in chrooic pain and under the influence of several strong pain medications. She reports  Regret about missing out on the grand childrens upbringing, had little contact. She left her husband until he suffered a heart  attack , and returned. She lost her mother.  She moved back to [place] in 2014 .    Review of Systems: Out of a complete 14 system review, the patient complains of only the following symptoms, and all other reviewed systems are negative.   The patient has had multiple fall and related injuries, she has balance problems, is motor impaired due to psycho motor slowing. She is under the influence of  medication that induce drowsiness and cognitive fog.  EDS  Questions not answered, patient is distracted and  tangential.  ,  but not  related to narcolepsy. There is no a sleep attack, cataplexy or dream intrusion reported. Positive for Malaise and fatigue .  She reports a lot of symptoms that are likely related to psychatric diagnosis.  She reports fatigue, malaise, weight gain.  She feels that she was an" active and tenacious" person, but attributed to  fatigue and malaise she is feeling  cognitivly , mentally and physically handicapped.  She has high expectations in returning to Adderall medication, but  Only under her PCP and with  her cardiologists supervision. She has been doing well on Adderall bid ( non extended release)- but this medication should not be provided by her neurologist/ sleep medicine clinic, but monitored by PCP and mental health professions.   .  She is aware that I am not treating chronic pain or non organic sleep disorders.   She knows that she  should undergo behavior therapy with a psychologist or psychiatrist. .     History   Social History  . Marital Status: Married    Spouse Name: N/A    Number of Children: N/A  . Years of Education: N/A   Occupational History  . Not on file.   Social History Main Topics  . Smoking status: Never Smoker   . Smokeless tobacco: Never Used  . Alcohol Use: No  . Drug Use: No  . Sexual Activity: Not on file   Other Topics Concern  . Not on file   Social History Narrative   Caffeine     Family History  Problem  Relation Age of Onset  . Hypertension Mother   . Diabetes Father   . Heart attack Father   . Heart attack Brother     Past Medical History  Diagnosis Date  . Hypertension   . CAD (coronary artery disease)     previous stent  . Hyperlipemia   . Abdominal discomfort     "due to medication intolerance"  . Hypersomnia, persistent 09/10/2013    Patient on  Stimulants .  Marland Kitchen Obesity, unspecified 09/10/2013  . Chronic pain syndrome 09/10/2013    Dr Nelva Bush,   . Narcotic addiction 12/05/2014  . Excessive daytime sleepiness 12/05/2014    Patient has not been seen for a sleep study as ordered, and used adderall to keep alert in daytime.  She agreed  In a contract not to have any scheduled medication for pain treatment from [facility] and will not receive refills for Adderall, which was initiated by dr Orland Penman, PCP>   . Shoulder joint pain     both shoulders    Past Surgical History  Procedure Laterality Date  . Angioplasty  03/01/2002    cutting balloon mid RCA  . Coronary stent placement  03/15/2005    distal RCA  . Nephrectomy    . Back surgery    . Cardiac catheterization  01/26/2007    mild diffuse CAD  . Cardiac catheterization  10/23/2009    nonobstructive CAD,60% prox RCA,50% prox LAD, 50% mid ramus    Current Outpatient Prescriptions  Medication Sig Dispense Refill  . amphetamine-dextroamphetamine (ADDERALL) 20 MG tablet One half tab in AM and one half tab at [facility] time. Po. 30 tablet 0  . atenolol (TENORMIN) 25 MG tablet TAKE ONE TABLET BY MOUTH IN THE MORNING AND TWO IN THE EVENING 90 tablet 6  . diazepam (VALIUM) 5 MG tablet Take 5 mg by mouth every 12 (twelve) hours as needed.     . ezetimibe-simvastatin (VYTORIN) 10-20 MG per tablet Take 1 tablet by mouth daily. 42 tablet 0  . fexofenadine (ALLEGRA) 180 MG tablet Take 180 mg by mouth daily as needed.     Marland Kitchen FLUoxetine (PROZAC) 20 MG capsule TAKE TWO CAPSULES BY MOUTH ONCE DAILY 60 capsule 6  . levothyroxine (SYNTHROID, LEVOTHROID) 50  MCG tablet Take 50 mcg by mouth daily before breakfast.    . lisinopril (PRINIVIL,ZESTRIL) 20 MG tablet TAKE ONE TABLET BY MOUTH IN THE MORNING AND ONE-HALF AT BEDTIME 45 tablet 5  . methocarbamol (ROBAXIN) 500 MG tablet Take 500 mg by mouth as needed.     Marland Kitchen morphine (MSIR) 15 MG tablet Take 15 mg by mouth every 6 (six) hours as needed for pain.    Marland Kitchen ondansetron (ZOFRAN) 8 MG tablet Take 1 tablet (8 mg total) by mouth every 12 (twelve) hours as needed for nausea. 10 tablet 0  .  aspirin 81 MG tablet Take 81 mg by mouth daily.    . hydrochlorothiazide (MICROZIDE) 12.5 MG capsule Take 1 capsule (12.5 mg total) by mouth daily. (Patient not taking: Reported on 12/05/2014) 30 capsule 6  . HYDROcodone-acetaminophen (NORCO) 10-325 MG per tablet Take 1-2 tablets by mouth as needed.     . potassium chloride (K-DUR,KLOR-CON) 10 MEQ tablet Take 10 mEq by mouth as needed (only takes if she dont eat potassium rich foods).     . traMADol (ULTRAM) 50 MG tablet Take 50-100 mg by mouth every 6 (six) hours as needed.      No current facility-administered medications for this visit.    Allergies as of 12/05/2014 - Review Complete 12/05/2014  Allergen Reaction Noted  . Cardizem [diltiazem hcl]  07/25/2012  . Codeine  07/25/2012  . Coreg [carvedilol]  06/13/2013  . Demerol [meperidine]  07/25/2012  . Erythromycin  07/25/2012  . Inderal [propranolol]  07/25/2012  . Lisinopril Cough 06/13/2013  . Procardia [nifedipine]  06/13/2013  . Wellbutrin [bupropion]  06/13/2013  . Zoloft [sertraline hcl]  07/25/2012    Vitals: BP 201/95 mmHg  Pulse 98  Resp 12  Ht '5\' 3"'  (1.6 Williams)  Wt 174 lb (78.926 kg)  BMI 30.83 kg/m2 Last Weight:  Wt Readings from Last 1 Encounters:  12/05/14 174 lb (78.926 kg)   Last Height:   Ht Readings from Last 1 Encounters:  12/05/14 '5\' 3"'  (1.6 Williams)    Physical exam:  General: The patient is awake, alert and appears not in acute distress. The patient is well groomed. Head:  Normocephalic, atraumatic. Neck is supple. Mallampati 3 , neck circumference: 15 , no nasal deviation.  Cardiovascular:  Regular rate and rhythm , without  murmurs or carotid bruit, and without distended neck veins. Respiratory: Lungs are clear to auscultation. Skin:  Without evidence of edema, or rash Trunk: BMI is  elevated .  Neurologic exam : The patient is awake and alert, oriented to place and time.  Memory subjective   described as intact.  There is a very limited  attention span & concentration ability.  The patient degresses frequently, has some tangential and very slow thinking.   Speech is fluent with dysphonia , not  Aphasia. Monotonous non melodic voice and speech.   Mood and affect are depressed.   Cranial nerves: Pupils are equal and briskly reactive to light. Funduscopic exam without evidence of pallor or edema.  Beginning cataracts. Extraocular movements  in vertical and horizontal planes intact and without nystagmus. Visual fields by finger perimetry are intact. Hearing to finger rub intact.  Facial sensation intact to fine touch. Facial motor expression is masked - but strength is symmetric and tongue and uvula move midline.  Motor exam:   Normal tone and  muscle bulk and symmetric strength in all extremities. Sensory:  Fine touch, pinprick and vibration were tested in all extremities. Proprioception is normal. Coordination: Rapid alternating movements in the fingers/hands is tested and slow -  Finger-to-nose maneuver tested and normal without evidence of ataxia, dysmetria or tremor.  Gait and station: Patient walks without assistive device , but she needs assistance : she has trouble to use the  stool climb up to the exam table.  Strength within normal limits. Stance is stable and normal. Tandem gait is fragmented. Romberg testing was positive . Deep tendon reflexes: in the upper and lower extremities are symmetric and intact.  Babinski maneuver   downgoing.   Assessment:  After physical and neurologic  examination, review of laboratory studies, imaging, neurophysiology testing and pre-existing records, assessment:   45 minute visit with 35 minutes of counseling , of behavior health management and redirecting th patients tangential thought process. There is  Urgent need for a pain medication review and she endorsed a high level of depression at 7 points. I have arranged for a psychiatry/ behavior health referral , and suspect a somatization  disorder.    0) snoring, restless sleep, her daytime sleepiness is improved, now she cannot sleep .  No organic sleep disorder documented in 2006 . She has not had a repeat study. Ordered HST , if not covered , get ONO. If not positive for apnea , no FOLLOW up.  1) chronic back Pain, joint pain - treated by PCP and Dr Nelva Bush, pain management . No narcotics prescribed from [facility] . Patient has agreed, she is slowed likely as a side effect of this pain management. Her psychomotor slowing and dysarthria seem related.   2) weight gain. Leading to Diabetes.  The patient believes she does not snore. Her husband has reported she does.  No apnea reported. She has improved her sleep hygiene,  I will not order  HLA test to rule out narcolepsy . the only test besides a sleep study at home would be an ONO, referral to psychiatry .  Marland Kitchen      Plan:  Treatment plan and additional workup : the patient had reportedly no sleep disordered breathing of significance in 2006. Will order a HST  test , non organic hypersomnia can be followed by her psychiatrist and PCP, Adderall helps but should not be prescribed in a sleep clinic if a patient has no organic sleep disorder. .  meanwhile her weight has increased, the narcotics may cause central apneas, Diazepam may worsen OSA, and she is dependent on pain medications. I plan to order  a SPLIT study for apnea evaluation.  I willl prescribe Adderall while Dr. Orland Penman is on maternity  leave.   I will not take over any aspect of psychiatric or  pain mangement.

## 2014-12-05 NOTE — Patient Instructions (Signed)
Home sleep test for snoring, obesity and under aspect of the current medications.

## 2014-12-07 DIAGNOSIS — G894 Chronic pain syndrome: Secondary | ICD-10-CM | POA: Diagnosis not present

## 2014-12-07 DIAGNOSIS — M170 Bilateral primary osteoarthritis of knee: Secondary | ICD-10-CM | POA: Diagnosis not present

## 2014-12-07 DIAGNOSIS — M25511 Pain in right shoulder: Secondary | ICD-10-CM | POA: Diagnosis not present

## 2014-12-07 DIAGNOSIS — M961 Postlaminectomy syndrome, not elsewhere classified: Secondary | ICD-10-CM | POA: Diagnosis not present

## 2014-12-29 DIAGNOSIS — I10 Essential (primary) hypertension: Secondary | ICD-10-CM | POA: Diagnosis not present

## 2014-12-29 DIAGNOSIS — E039 Hypothyroidism, unspecified: Secondary | ICD-10-CM | POA: Diagnosis not present

## 2014-12-29 DIAGNOSIS — R739 Hyperglycemia, unspecified: Secondary | ICD-10-CM | POA: Diagnosis not present

## 2014-12-29 DIAGNOSIS — M2550 Pain in unspecified joint: Secondary | ICD-10-CM | POA: Diagnosis not present

## 2015-01-13 DIAGNOSIS — R5382 Chronic fatigue, unspecified: Secondary | ICD-10-CM | POA: Diagnosis not present

## 2015-01-13 DIAGNOSIS — F419 Anxiety disorder, unspecified: Secondary | ICD-10-CM | POA: Diagnosis not present

## 2015-01-13 DIAGNOSIS — E119 Type 2 diabetes mellitus without complications: Secondary | ICD-10-CM | POA: Diagnosis not present

## 2015-01-13 DIAGNOSIS — I10 Essential (primary) hypertension: Secondary | ICD-10-CM | POA: Diagnosis not present

## 2015-01-13 DIAGNOSIS — E039 Hypothyroidism, unspecified: Secondary | ICD-10-CM | POA: Diagnosis not present

## 2015-01-13 DIAGNOSIS — E780 Pure hypercholesterolemia: Secondary | ICD-10-CM | POA: Diagnosis not present

## 2015-02-22 DIAGNOSIS — M961 Postlaminectomy syndrome, not elsewhere classified: Secondary | ICD-10-CM | POA: Diagnosis not present

## 2015-02-22 DIAGNOSIS — G894 Chronic pain syndrome: Secondary | ICD-10-CM | POA: Diagnosis not present

## 2015-02-22 DIAGNOSIS — M542 Cervicalgia: Secondary | ICD-10-CM | POA: Diagnosis not present

## 2015-02-22 DIAGNOSIS — M170 Bilateral primary osteoarthritis of knee: Secondary | ICD-10-CM | POA: Diagnosis not present

## 2015-02-23 ENCOUNTER — Other Ambulatory Visit: Payer: Self-pay | Admitting: Physician Assistant

## 2015-02-23 DIAGNOSIS — M542 Cervicalgia: Secondary | ICD-10-CM

## 2015-02-28 ENCOUNTER — Ambulatory Visit
Admission: RE | Admit: 2015-02-28 | Discharge: 2015-02-28 | Disposition: A | Discharge status: Home / Self Care | Payer: Medicare Other | Source: Ambulatory Visit | Attending: Physician Assistant | Admitting: Physician Assistant

## 2015-02-28 DIAGNOSIS — M542 Cervicalgia: Secondary | ICD-10-CM

## 2015-02-28 DIAGNOSIS — M5030 Other cervical disc degeneration, unspecified cervical region: Secondary | ICD-10-CM | POA: Diagnosis not present

## 2015-03-01 DIAGNOSIS — R062 Wheezing: Secondary | ICD-10-CM | POA: Diagnosis not present

## 2015-03-01 DIAGNOSIS — F411 Generalized anxiety disorder: Secondary | ICD-10-CM | POA: Diagnosis not present

## 2015-03-01 DIAGNOSIS — R5382 Chronic fatigue, unspecified: Secondary | ICD-10-CM | POA: Diagnosis not present

## 2015-03-01 DIAGNOSIS — G8929 Other chronic pain: Secondary | ICD-10-CM | POA: Diagnosis not present

## 2015-03-06 DIAGNOSIS — H18412 Arcus senilis, left eye: Secondary | ICD-10-CM | POA: Diagnosis not present

## 2015-03-06 DIAGNOSIS — H01009 Unspecified blepharitis unspecified eye, unspecified eyelid: Secondary | ICD-10-CM | POA: Diagnosis not present

## 2015-03-06 DIAGNOSIS — H04121 Dry eye syndrome of right lacrimal gland: Secondary | ICD-10-CM | POA: Diagnosis not present

## 2015-03-06 DIAGNOSIS — H18411 Arcus senilis, right eye: Secondary | ICD-10-CM | POA: Diagnosis not present

## 2015-03-08 DIAGNOSIS — Z791 Long term (current) use of non-steroidal anti-inflammatories (NSAID): Secondary | ICD-10-CM | POA: Diagnosis not present

## 2015-03-08 DIAGNOSIS — G894 Chronic pain syndrome: Secondary | ICD-10-CM | POA: Diagnosis not present

## 2015-03-08 DIAGNOSIS — M542 Cervicalgia: Secondary | ICD-10-CM | POA: Diagnosis not present

## 2015-03-20 ENCOUNTER — Other Ambulatory Visit: Payer: Self-pay | Admitting: Cardiovascular Disease

## 2015-03-20 NOTE — Telephone Encounter (Signed)
Rx(s) sent to pharmacy electronically.  

## 2015-03-23 DIAGNOSIS — H01006 Unspecified blepharitis left eye, unspecified eyelid: Secondary | ICD-10-CM | POA: Diagnosis not present

## 2015-03-23 DIAGNOSIS — Z961 Presence of intraocular lens: Secondary | ICD-10-CM | POA: Diagnosis not present

## 2015-03-23 DIAGNOSIS — H01003 Unspecified blepharitis right eye, unspecified eyelid: Secondary | ICD-10-CM | POA: Diagnosis not present

## 2015-03-23 DIAGNOSIS — H04123 Dry eye syndrome of bilateral lacrimal glands: Secondary | ICD-10-CM | POA: Diagnosis not present

## 2015-04-04 ENCOUNTER — Telehealth: Payer: Self-pay | Admitting: Cardiovascular Disease

## 2015-04-04 NOTE — Telephone Encounter (Signed)
Pt called in wanting to know if it all right for her to take a new medication called Hydraflexin for joint pain . She wants to make sure this will not counteract with her other medications. Please call  Thanks

## 2015-04-05 NOTE — Telephone Encounter (Signed)
Question sent to Dr. Salena Saner for review and advise.

## 2015-04-05 NOTE — Telephone Encounter (Signed)
I seen o problem with the ingredients in Hydraflexin and her heart meds/condition.

## 2015-04-05 NOTE — Telephone Encounter (Signed)
Patient notified per Dr. Salena Saner OK to take this med with cardiac meds.

## 2015-04-28 DIAGNOSIS — F419 Anxiety disorder, unspecified: Secondary | ICD-10-CM | POA: Diagnosis not present

## 2015-04-28 DIAGNOSIS — E780 Pure hypercholesterolemia: Secondary | ICD-10-CM | POA: Diagnosis not present

## 2015-04-28 DIAGNOSIS — I2510 Atherosclerotic heart disease of native coronary artery without angina pectoris: Secondary | ICD-10-CM | POA: Diagnosis not present

## 2015-04-28 DIAGNOSIS — E039 Hypothyroidism, unspecified: Secondary | ICD-10-CM | POA: Diagnosis not present

## 2015-04-28 DIAGNOSIS — G8929 Other chronic pain: Secondary | ICD-10-CM | POA: Diagnosis not present

## 2015-04-28 DIAGNOSIS — I10 Essential (primary) hypertension: Secondary | ICD-10-CM | POA: Diagnosis not present

## 2015-04-28 DIAGNOSIS — E119 Type 2 diabetes mellitus without complications: Secondary | ICD-10-CM | POA: Diagnosis not present

## 2015-04-28 DIAGNOSIS — M159 Polyosteoarthritis, unspecified: Secondary | ICD-10-CM | POA: Diagnosis not present

## 2015-04-28 DIAGNOSIS — F329 Major depressive disorder, single episode, unspecified: Secondary | ICD-10-CM | POA: Diagnosis not present

## 2015-04-28 DIAGNOSIS — R5382 Chronic fatigue, unspecified: Secondary | ICD-10-CM | POA: Diagnosis not present

## 2015-04-28 DIAGNOSIS — Z23 Encounter for immunization: Secondary | ICD-10-CM | POA: Diagnosis not present

## 2015-05-04 ENCOUNTER — Telehealth: Payer: Self-pay | Admitting: Cardiovascular Disease

## 2015-05-04 MED ORDER — LISINOPRIL 20 MG PO TABS: ORAL_TABLET | ORAL | Status: DC

## 2015-05-04 MED ORDER — ATENOLOL 25 MG PO TABS: ORAL_TABLET | ORAL | Status: DC

## 2015-05-04 NOTE — Telephone Encounter (Signed)
Atenolol & lisinopril refilled for 90 day supply.   Left message for patient that these 2 medications would be refilled but prozac would need to be OK'ed by MD as it is a non-cardiac medication.   Will call patient back if we cannot refill prozac.   Patient is scheduled to see Dr. Salena Saner 07/10/15

## 2015-05-04 NOTE — Telephone Encounter (Signed)
OK to refill prozac as well, please. But pleae ask her to refer future requests to PCP.

## 2015-05-04 NOTE — Telephone Encounter (Signed)
°  1. Which medications need to be refilled? Atenolol 25mg , Lisinopril 20mg , Prozac 20mg   2. Which pharmacy is medication to be sent to? [facility] [address]  3. Do they need a 30 day or 90 day supply? 90 days  4. Would they like a call back once the medication has been sent to the pharmacy? Yes  Pt has not seen Dr C in a while but does have upcoming appointments

## 2015-05-05 MED ORDER — FLUOXETINE HCL 20 MG PO CAPS: ORAL_CAPSULE | ORAL | Status: DC

## 2015-05-05 NOTE — Telephone Encounter (Signed)
prozac refilled per Dr. Salena Saner

## 2015-05-15 DIAGNOSIS — G8929 Other chronic pain: Secondary | ICD-10-CM | POA: Diagnosis not present

## 2015-05-15 DIAGNOSIS — E780 Pure hypercholesterolemia: Secondary | ICD-10-CM | POA: Diagnosis not present

## 2015-05-15 DIAGNOSIS — I10 Essential (primary) hypertension: Secondary | ICD-10-CM | POA: Diagnosis not present

## 2015-05-15 DIAGNOSIS — R5383 Other fatigue: Secondary | ICD-10-CM | POA: Diagnosis not present

## 2015-05-15 DIAGNOSIS — E119 Type 2 diabetes mellitus without complications: Secondary | ICD-10-CM | POA: Diagnosis not present

## 2015-05-15 DIAGNOSIS — E639 Nutritional deficiency, unspecified: Secondary | ICD-10-CM | POA: Diagnosis not present

## 2015-05-15 DIAGNOSIS — E039 Hypothyroidism, unspecified: Secondary | ICD-10-CM | POA: Diagnosis not present

## 2015-06-05 ENCOUNTER — Other Ambulatory Visit: Payer: Self-pay | Admitting: Cardiovascular Disease

## 2015-06-05 NOTE — Telephone Encounter (Signed)
REFILL 

## 2015-06-08 DIAGNOSIS — M961 Postlaminectomy syndrome, not elsewhere classified: Secondary | ICD-10-CM | POA: Diagnosis not present

## 2015-06-08 DIAGNOSIS — M5032 Other cervical disc degeneration, mid-cervical region: Secondary | ICD-10-CM | POA: Diagnosis not present

## 2015-06-08 DIAGNOSIS — M25571 Pain in right ankle and joints of right foot: Secondary | ICD-10-CM | POA: Diagnosis not present

## 2015-06-08 DIAGNOSIS — M542 Cervicalgia: Secondary | ICD-10-CM | POA: Diagnosis not present

## 2015-06-20 DIAGNOSIS — M25512 Pain in left shoulder: Secondary | ICD-10-CM | POA: Diagnosis not present

## 2015-06-20 DIAGNOSIS — M25511 Pain in right shoulder: Secondary | ICD-10-CM | POA: Diagnosis not present

## 2015-06-23 ENCOUNTER — Telehealth: Payer: Self-pay | Admitting: Cardiovascular Disease

## 2015-06-23 NOTE — Telephone Encounter (Signed)
Patient reports Dr. Salena Williams put her on prozac to help with her shoulder and leg pain. Informed her that the last time the medication was OK'ed to be refilled by Dr. Salena Williams was in June and she was informed at time time this prescription would need to go to her PCP. Medication was refilled in July without being OK'ed by MD but only for a 15 day supply.   Informed patient that MD would have to review this refill. Informed her to contact PCP for this refill.   Patient [place] Dr. Salena Williams is out of the [place] but she has 4 days left but cannot get to the "zero" button. Asked if Kathleen Boozer, NP could refill this for her but informed her she is not in the office.   Patient states she has been doing research on her medications and will have some questions about them.   She states she will see Dr. Salena Williams before she sees her PCP - she states Dr. Ihor Dow has moved but she sees a different provider in that practice.  Reiterated to patient to contact PCP for refill per Dr. Renaye Rakers request in June but she was insistent that Dr. Salena Williams help her with this refill one more time.  Informed her our protocol is to contact the MD for non-cardiac medication refill requests and she voiced understanding.

## 2015-06-23 NOTE — Telephone Encounter (Signed)
[name] is calling because she states that she only have 4 days left Prozac  . It was filled on 06/05/15 and she takes one capsule by mouth twice a day .Marland Kitchen Wants to know if she will be able to get a refill . Please call   Thanks

## 2015-06-26 MED ORDER — FLUOXETINE HCL 20 MG PO CAPS: 20.0000 mg | ORAL_CAPSULE | Freq: Two times a day (BID) | ORAL | Status: DC

## 2015-06-26 NOTE — Telephone Encounter (Signed)
Rx(s) sent to pharmacy electronically.  

## 2015-06-26 NOTE — Telephone Encounter (Signed)
Ok to refill 30 days, no future refills please (refer those to PCP)

## 2015-07-10 ENCOUNTER — Encounter: Payer: Self-pay | Admitting: Cardiovascular Disease

## 2015-07-10 ENCOUNTER — Ambulatory Visit (INDEPENDENT_AMBULATORY_CARE_PROVIDER_SITE_OTHER): Payer: Medicare Other | Admitting: Cardiovascular Disease

## 2015-07-10 VITALS — BP 150/81 | HR 80 | Ht 63.0 in | Wt 172.0 lb

## 2015-07-10 DIAGNOSIS — I10 Essential (primary) hypertension: Secondary | ICD-10-CM | POA: Diagnosis not present

## 2015-07-10 DIAGNOSIS — Z79899 Other long term (current) drug therapy: Secondary | ICD-10-CM | POA: Diagnosis not present

## 2015-07-10 DIAGNOSIS — I2510 Atherosclerotic heart disease of native coronary artery without angina pectoris: Secondary | ICD-10-CM | POA: Diagnosis not present

## 2015-07-10 MED ORDER — EZETIMIBE-SIMVASTATIN 10-40 MG PO TABS: 1.0000 | ORAL_TABLET | Freq: Every day | ORAL | Status: DC

## 2015-07-10 NOTE — Progress Notes (Signed)
Patient ID: [name], female   DOB: 1941/07/28, 74 y.o.   MRN: [id number]     Cardiology Office Note   Date:  07/12/2015   ID:  [name], DOB May 27, 1941, MRN [id number]  PCP:  Gretel Acre, MD  Cardiologist:   Thurmon Fair, MD   Chief Complaint  Patient presents with  . 2 YEAR VISIT    Patient has had chest pain/pressure.      History of Present Illness: [name] is a 74 y.o. female who presents for follow-up for moderate coronary artery disease. She has a history of angioplasty and stenting in the remote past with procedures performed on both the LAD and RCA. Most recently she received a 2.0 x 12 mm bare metal mini vision stent to the distal right coronary artery in 2006. Her last cardiac catheterization was performed in 2006 and showed moderate nonobstructive disease involving the proximal RCA, proximal LAD and proximal ramus intermedius artery. She had a normal nuclear stress test in 2011. She has hyperlipidemia, but has interrupted statin therapy numerous times due to musculoskeletal complaints. She is described as "statin intolerant", but there is a lot of interplay with fibromyalgia, chronic fatigue syndrome, depression and chronic pain.  She has numerous somatic complaints today although with some surrounding severe joint pain in multiple locations, especially her arms and shoulders and her knees. She also has a lot of problems with low back pain. She complains of sleeplessness and extreme fatigue. "She can't do anything". She has a history of severe depression. She has had evaluations by both neurologists and psychiatrists, pain specialist and orthopedic surgeons.  Her functional status cannot be readily assessed since she is so sedentary, but she does not have angina or dyspnea, palpitations, syncope or lower showed edema.  Past Medical History  Diagnosis Date  . Hypertension   . CAD (coronary artery disease)     previous stent  . Hyperlipemia   . Abdominal  discomfort     "due to medication intolerance"  . Hypersomnia, persistent 09/10/2013    Patient on  Stimulants .  Marland Kitchen Obesity, unspecified 09/10/2013  . Chronic pain syndrome 09/10/2013    Dr Ethelene Hal,   . Narcotic addiction 12/05/2014  . Excessive daytime sleepiness 12/05/2014    Patient has not been seen for a sleep study as ordered, and used adderall to keep alert in daytime.  She agreed  In a contract not to have any scheduled medication for pain treatment from [facility] and will not receive refills for Adderall, which was initiated by dr Ihor Dow, PCP>   . Shoulder joint pain     both shoulders    Past Surgical History  Procedure Laterality Date  . Angioplasty  03/01/2002    cutting balloon mid RCA  . Coronary stent placement  03/15/2005    distal RCA  . Nephrectomy    . Back surgery    . Cardiac catheterization  01/26/2007    mild diffuse CAD  . Cardiac catheterization  10/23/2009    nonobstructive CAD,60% prox RCA,50% prox LAD, 50% mid ramus     Current Outpatient Prescriptions  Medication Sig Dispense Refill  . amphetamine-dextroamphetamine (ADDERALL) 20 MG tablet One half tab in AM and one half tab at [facility] time. Po. (Patient taking differently: Take one tablet in the AM and two in the PM.) 30 tablet 0  . aspirin 81 MG tablet Take 81 mg by mouth daily.    Marland Kitchen atenolol (TENORMIN) 25 MG tablet Take 1 tablet  by mouth every morning and 2 tablets in the evening. 270 tablet 0  . diazepam (VALIUM) 5 MG tablet Take 5 mg by mouth every 12 (twelve) hours as needed.     . fexofenadine (ALLEGRA) 180 MG tablet Take 180 mg by mouth daily as needed.     Marland Kitchen FLUoxetine (PROZAC) 20 MG capsule Take 1 capsule (20 mg total) by mouth 2 (two) times daily. <please get future refills from PCP> 60 capsule 0  . hydrochlorothiazide (MICROZIDE) 12.5 MG capsule Take 1 capsule (12.5 mg total) by mouth daily. 30 capsule 6  . HYDROcodone-acetaminophen (NORCO) 10-325 MG per tablet Take 1-2 tablets by mouth as needed.     Marland Kitchen  levothyroxine (SYNTHROID, LEVOTHROID) 50 MCG tablet Take 50 mcg by mouth daily before breakfast.    . lisinopril (PRINIVIL,ZESTRIL) 20 MG tablet TAKE ONE TABLET BY MOUTH IN THE MORNING AND ONE-HALF AT BEDTIME 135 tablet 0  . methocarbamol (ROBAXIN) 500 MG tablet Take 500 mg by mouth as needed.     Marland Kitchen morphine (MSIR) 15 MG tablet Take 15 mg by mouth every 6 (six) hours as needed for pain.    Marland Kitchen ondansetron (ZOFRAN) 8 MG tablet Take 1 tablet (8 mg total) by mouth every 12 (twelve) hours as needed for nausea. 10 tablet 0  . potassium chloride (K-DUR,KLOR-CON) 10 MEQ tablet Take 10 mEq by mouth as needed (only takes if she dont eat potassium rich foods).     . traMADol (ULTRAM) 50 MG tablet Take 50-100 mg by mouth every 6 (six) hours as needed.     . ezetimibe-simvastatin (VYTORIN) 10-40 MG per tablet Take 1 tablet by mouth daily. 90 tablet 3   No current facility-administered medications for this visit.    Allergies:   Cardizem; Codeine; Coreg; Demerol; Erythromycin; Inderal; Lisinopril; Procardia; Wellbutrin; and Zoloft    Social History:  The patient  reports that she has never smoked. She has never used smokeless tobacco. She reports that she does not drink alcohol or use illicit drugs.   Family History:  The patient's family history includes Diabetes in her father; Heart attack in her brother and father; Hypertension in her mother.    ROS:  Please see the history of present illness.    Otherwise, review of systems positive for none.   All other systems are reviewed and negative.    PHYSICAL EXAM: VS:  BP 150/81 mmHg  Pulse 80  Ht 5\' 3"  (1.6 m)  Wt 172 lb (78.019 kg)  BMI 30.48 kg/m2 , BMI Body mass index is 30.48 kg/(m^2).  General: Alert, oriented x3, no distress Head: no evidence of trauma, PERRL, EOMI, no exophtalmos or lid lag, no myxedema, no xanthelasma; normal ears, nose and oropharynx Neck: normal jugular venous pulsations and no hepatojugular reflux; brisk carotid pulses  without delay and no carotid bruits Chest: clear to auscultation, no signs of consolidation by percussion or palpation, normal fremitus, symmetrical and full respiratory excursions Cardiovascular: normal position and quality of the apical impulse, regular rhythm, normal first and second heart sounds, no murmurs, rubs or gallops Abdomen: no tenderness or distention, no masses by palpation, no abnormal pulsatility or arterial bruits, normal bowel sounds, no hepatosplenomegaly Extremities: no clubbing, cyanosis or edema; 2+ radial, ulnar and brachial pulses bilaterally; 2+ right femoral, posterior tibial and dorsalis pedis pulses; 2+ left femoral, posterior tibial and dorsalis pedis pulses; no subclavian or femoral bruits Neurological: grossly nonfocal Psych: euthymic mood, full affect   EKG:  EKG is ordered today.  The ekg ordered today demonstrates normal sinus rhythm, normal tracing   Recent Labs: Total cholesterol 256, triglycerides 127, HDL 60, LDL 170 Hemoglobin A1c 6.8% Creatinine 0.72 Normal TSH and liver function tests  Wt Readings from Last 3 Encounters:  07/10/15 172 lb (78.019 kg)  12/05/14 174 lb (78.926 kg)  09/10/13 174 lb (78.926 kg)      ASSESSMENT AND PLAN:  1. CAD - despite a remote history of revascularization procedures, Elnoria has not had acute coronary problems in the last 10 years.   2. HTN - Her blood pressure is generally well controlled, although today it is a little high in clinic. Usually at home her blood pressure is in the 130s. She does not smoke and she has diet-controlled diabetes. She is very sedentary. She is mildly obese.   3. Hyperlipidemia - The most worrisome uncorrected risk factor is severe hypercholesterolemia. Target LDL less than 70. She reports severe side effects with atorvastatin in the past. She does not want to try Crestor since she has heard about its possible side effects. She states that in the past she had the best results and least  side effects with Vytorin. She would like to try that medication again. I'm a little skeptical that she will continue with long-term but I'm willing to give it another try. Consider PCS K9 inhibitors, but she is very worried about the cost.  4. Depression/chronic fatigue syndrome. Questionable diagnosis of narcolepsy. Chronic pain. These seem to be having a much more profound impact on her quality of life than any of her cardiovascular problems.    Current medicines are reviewed at length with the patient today.  The patient does not have concerns regarding medicines.   Labs/ tests ordered today include:   Orders Placed This Encounter  Procedures  . Comprehensive metabolic panel  . Lipid panel  . EKG 12-Lead   Patient Instructions  Medication Instructions:   START VYTORIN 10/40MG  DAILY   Labwork:  FASTING IN 3 MONTHS AT [facility]     Follow-Up:  ONE YEAR      SignedThurmon Fair, MD  07/12/2015 3:47 PM    Thurmon Fair, MD, [facility] [phone] office [phone] pager

## 2015-07-10 NOTE — Patient Instructions (Signed)
Medication Instructions:   START VYTORIN 10/40MG  DAILY   Labwork:  FASTING IN 3 MONTHS AT [facility]     Follow-Up:  ONE YEAR

## 2015-07-12 ENCOUNTER — Encounter: Payer: Self-pay | Admitting: Cardiovascular Disease

## 2015-07-13 DIAGNOSIS — M170 Bilateral primary osteoarthritis of knee: Secondary | ICD-10-CM | POA: Diagnosis not present

## 2015-07-20 DIAGNOSIS — M170 Bilateral primary osteoarthritis of knee: Secondary | ICD-10-CM | POA: Diagnosis not present

## 2015-07-24 DIAGNOSIS — M542 Cervicalgia: Secondary | ICD-10-CM | POA: Diagnosis not present

## 2015-07-24 DIAGNOSIS — M5032 Other cervical disc degeneration, mid-cervical region: Secondary | ICD-10-CM | POA: Diagnosis not present

## 2015-07-27 DIAGNOSIS — M170 Bilateral primary osteoarthritis of knee: Secondary | ICD-10-CM | POA: Diagnosis not present

## 2015-07-28 ENCOUNTER — Other Ambulatory Visit: Payer: Self-pay | Admitting: Cardiovascular Disease

## 2015-07-28 ENCOUNTER — Telehealth: Payer: Self-pay | Admitting: Cardiovascular Disease

## 2015-07-28 NOTE — Telephone Encounter (Signed)
OK to refill

## 2015-07-28 NOTE — Telephone Encounter (Signed)
°  1. Which medications need to be refilled? Prozac 20( BID)  2. Which pharmacy is medication to be sent to? [facility] in [place]  3. Do they need a 30 day or 90 day supply? 30  4. Would they like a call back once the medication has been sent to the pharmacy? Yes   SHE IS COMPLETELY OUT

## 2015-08-02 DIAGNOSIS — R5382 Chronic fatigue, unspecified: Secondary | ICD-10-CM | POA: Diagnosis not present

## 2015-08-02 DIAGNOSIS — I10 Essential (primary) hypertension: Secondary | ICD-10-CM | POA: Diagnosis not present

## 2015-08-02 DIAGNOSIS — F329 Major depressive disorder, single episode, unspecified: Secondary | ICD-10-CM | POA: Diagnosis not present

## 2015-08-02 DIAGNOSIS — Z79899 Other long term (current) drug therapy: Secondary | ICD-10-CM | POA: Diagnosis not present

## 2015-08-02 DIAGNOSIS — I2510 Atherosclerotic heart disease of native coronary artery without angina pectoris: Secondary | ICD-10-CM | POA: Diagnosis not present

## 2015-08-02 DIAGNOSIS — R21 Rash and other nonspecific skin eruption: Secondary | ICD-10-CM | POA: Diagnosis not present

## 2015-08-02 DIAGNOSIS — G8929 Other chronic pain: Secondary | ICD-10-CM | POA: Diagnosis not present

## 2015-08-02 DIAGNOSIS — E780 Pure hypercholesterolemia: Secondary | ICD-10-CM | POA: Diagnosis not present

## 2015-08-02 DIAGNOSIS — E119 Type 2 diabetes mellitus without complications: Secondary | ICD-10-CM | POA: Diagnosis not present

## 2015-08-02 DIAGNOSIS — E039 Hypothyroidism, unspecified: Secondary | ICD-10-CM | POA: Diagnosis not present

## 2015-08-02 DIAGNOSIS — F419 Anxiety disorder, unspecified: Secondary | ICD-10-CM | POA: Diagnosis not present

## 2015-08-02 DIAGNOSIS — M159 Polyosteoarthritis, unspecified: Secondary | ICD-10-CM | POA: Diagnosis not present

## 2015-08-02 NOTE — Telephone Encounter (Signed)
Medication was refill by pt PCP Dr Modesto Charon

## 2015-08-03 DIAGNOSIS — M170 Bilateral primary osteoarthritis of knee: Secondary | ICD-10-CM | POA: Diagnosis not present

## 2015-08-10 DIAGNOSIS — M25512 Pain in left shoulder: Secondary | ICD-10-CM | POA: Diagnosis not present

## 2015-08-10 DIAGNOSIS — M25511 Pain in right shoulder: Secondary | ICD-10-CM | POA: Diagnosis not present

## 2015-08-10 DIAGNOSIS — M170 Bilateral primary osteoarthritis of knee: Secondary | ICD-10-CM | POA: Diagnosis not present

## 2015-08-17 DIAGNOSIS — M9902 Segmental and somatic dysfunction of thoracic region: Secondary | ICD-10-CM | POA: Diagnosis not present

## 2015-08-17 DIAGNOSIS — M25511 Pain in right shoulder: Secondary | ICD-10-CM | POA: Diagnosis not present

## 2015-08-17 DIAGNOSIS — M7501 Adhesive capsulitis of right shoulder: Secondary | ICD-10-CM | POA: Diagnosis not present

## 2015-08-17 DIAGNOSIS — M5137 Other intervertebral disc degeneration, lumbosacral region: Secondary | ICD-10-CM | POA: Diagnosis not present

## 2015-08-17 DIAGNOSIS — M9901 Segmental and somatic dysfunction of cervical region: Secondary | ICD-10-CM | POA: Diagnosis not present

## 2015-08-17 DIAGNOSIS — M19011 Primary osteoarthritis, right shoulder: Secondary | ICD-10-CM | POA: Diagnosis not present

## 2015-08-17 DIAGNOSIS — M9903 Segmental and somatic dysfunction of lumbar region: Secondary | ICD-10-CM | POA: Diagnosis not present

## 2015-08-17 DIAGNOSIS — M5032 Other cervical disc degeneration, mid-cervical region: Secondary | ICD-10-CM | POA: Diagnosis not present

## 2015-08-17 DIAGNOSIS — M9904 Segmental and somatic dysfunction of sacral region: Secondary | ICD-10-CM | POA: Diagnosis not present

## 2015-08-17 DIAGNOSIS — M5136 Other intervertebral disc degeneration, lumbar region: Secondary | ICD-10-CM | POA: Diagnosis not present

## 2015-09-04 ENCOUNTER — Other Ambulatory Visit: Payer: Self-pay | Admitting: Cardiovascular Disease

## 2015-09-04 NOTE — Telephone Encounter (Signed)
REFILL 

## 2015-11-17 DIAGNOSIS — G894 Chronic pain syndrome: Secondary | ICD-10-CM | POA: Diagnosis not present

## 2015-11-17 DIAGNOSIS — M542 Cervicalgia: Secondary | ICD-10-CM | POA: Diagnosis not present

## 2015-11-17 DIAGNOSIS — M961 Postlaminectomy syndrome, not elsewhere classified: Secondary | ICD-10-CM | POA: Diagnosis not present

## 2015-12-28 DIAGNOSIS — M25562 Pain in left knee: Secondary | ICD-10-CM | POA: Diagnosis not present

## 2015-12-28 DIAGNOSIS — M170 Bilateral primary osteoarthritis of knee: Secondary | ICD-10-CM | POA: Diagnosis not present

## 2015-12-28 DIAGNOSIS — M25561 Pain in right knee: Secondary | ICD-10-CM | POA: Diagnosis not present

## 2016-01-02 DIAGNOSIS — M25562 Pain in left knee: Secondary | ICD-10-CM | POA: Diagnosis not present

## 2016-01-02 DIAGNOSIS — M25561 Pain in right knee: Secondary | ICD-10-CM | POA: Diagnosis not present

## 2016-01-02 DIAGNOSIS — R2689 Other abnormalities of gait and mobility: Secondary | ICD-10-CM | POA: Diagnosis not present

## 2016-01-02 DIAGNOSIS — M1711 Unilateral primary osteoarthritis, right knee: Secondary | ICD-10-CM | POA: Diagnosis not present

## 2016-01-02 DIAGNOSIS — M170 Bilateral primary osteoarthritis of knee: Secondary | ICD-10-CM | POA: Diagnosis not present

## 2016-01-09 DIAGNOSIS — M170 Bilateral primary osteoarthritis of knee: Secondary | ICD-10-CM | POA: Diagnosis not present

## 2016-01-09 DIAGNOSIS — M25562 Pain in left knee: Secondary | ICD-10-CM | POA: Diagnosis not present

## 2016-01-09 DIAGNOSIS — M1712 Unilateral primary osteoarthritis, left knee: Secondary | ICD-10-CM | POA: Diagnosis not present

## 2016-01-09 DIAGNOSIS — M25561 Pain in right knee: Secondary | ICD-10-CM | POA: Diagnosis not present

## 2016-01-09 DIAGNOSIS — R2689 Other abnormalities of gait and mobility: Secondary | ICD-10-CM | POA: Diagnosis not present

## 2016-01-16 DIAGNOSIS — M25561 Pain in right knee: Secondary | ICD-10-CM | POA: Diagnosis not present

## 2016-01-16 DIAGNOSIS — M1711 Unilateral primary osteoarthritis, right knee: Secondary | ICD-10-CM | POA: Diagnosis not present

## 2016-01-24 DIAGNOSIS — M25562 Pain in left knee: Secondary | ICD-10-CM | POA: Diagnosis not present

## 2016-01-24 DIAGNOSIS — M1712 Unilateral primary osteoarthritis, left knee: Secondary | ICD-10-CM | POA: Diagnosis not present

## 2016-01-30 DIAGNOSIS — M25561 Pain in right knee: Secondary | ICD-10-CM | POA: Diagnosis not present

## 2016-01-30 DIAGNOSIS — M19011 Primary osteoarthritis, right shoulder: Secondary | ICD-10-CM | POA: Diagnosis not present

## 2016-01-30 DIAGNOSIS — M170 Bilateral primary osteoarthritis of knee: Secondary | ICD-10-CM | POA: Diagnosis not present

## 2016-01-30 DIAGNOSIS — M25562 Pain in left knee: Secondary | ICD-10-CM | POA: Diagnosis not present

## 2016-03-05 DIAGNOSIS — R5382 Chronic fatigue, unspecified: Secondary | ICD-10-CM | POA: Diagnosis not present

## 2016-03-05 DIAGNOSIS — R062 Wheezing: Secondary | ICD-10-CM | POA: Diagnosis not present

## 2016-03-05 DIAGNOSIS — J209 Acute bronchitis, unspecified: Secondary | ICD-10-CM | POA: Diagnosis not present

## 2016-03-05 DIAGNOSIS — E119 Type 2 diabetes mellitus without complications: Secondary | ICD-10-CM | POA: Diagnosis not present

## 2016-03-05 DIAGNOSIS — I10 Essential (primary) hypertension: Secondary | ICD-10-CM | POA: Diagnosis not present

## 2016-03-12 DIAGNOSIS — R0989 Other specified symptoms and signs involving the circulatory and respiratory systems: Secondary | ICD-10-CM | POA: Diagnosis not present

## 2016-03-12 DIAGNOSIS — B370 Candidal stomatitis: Secondary | ICD-10-CM | POA: Diagnosis not present

## 2016-03-21 DIAGNOSIS — I10 Essential (primary) hypertension: Secondary | ICD-10-CM | POA: Diagnosis not present

## 2016-03-21 DIAGNOSIS — E039 Hypothyroidism, unspecified: Secondary | ICD-10-CM | POA: Diagnosis not present

## 2016-03-21 DIAGNOSIS — F329 Major depressive disorder, single episode, unspecified: Secondary | ICD-10-CM | POA: Diagnosis not present

## 2016-03-21 DIAGNOSIS — R062 Wheezing: Secondary | ICD-10-CM | POA: Diagnosis not present

## 2016-03-21 DIAGNOSIS — Z79899 Other long term (current) drug therapy: Secondary | ICD-10-CM | POA: Diagnosis not present

## 2016-03-21 DIAGNOSIS — B370 Candidal stomatitis: Secondary | ICD-10-CM | POA: Diagnosis not present

## 2016-03-21 DIAGNOSIS — G8929 Other chronic pain: Secondary | ICD-10-CM | POA: Diagnosis not present

## 2016-03-21 DIAGNOSIS — R0989 Other specified symptoms and signs involving the circulatory and respiratory systems: Secondary | ICD-10-CM | POA: Diagnosis not present

## 2016-03-21 DIAGNOSIS — F4310 Post-traumatic stress disorder, unspecified: Secondary | ICD-10-CM | POA: Diagnosis not present

## 2016-04-05 DIAGNOSIS — G894 Chronic pain syndrome: Secondary | ICD-10-CM | POA: Diagnosis not present

## 2016-04-05 DIAGNOSIS — M170 Bilateral primary osteoarthritis of knee: Secondary | ICD-10-CM | POA: Diagnosis not present

## 2016-04-05 DIAGNOSIS — M542 Cervicalgia: Secondary | ICD-10-CM | POA: Diagnosis not present

## 2016-04-05 DIAGNOSIS — M961 Postlaminectomy syndrome, not elsewhere classified: Secondary | ICD-10-CM | POA: Diagnosis not present

## 2016-05-08 DIAGNOSIS — R2689 Other abnormalities of gait and mobility: Secondary | ICD-10-CM | POA: Diagnosis not present

## 2016-05-08 DIAGNOSIS — M25561 Pain in right knee: Secondary | ICD-10-CM | POA: Diagnosis not present

## 2016-05-08 DIAGNOSIS — M170 Bilateral primary osteoarthritis of knee: Secondary | ICD-10-CM | POA: Diagnosis not present

## 2016-05-08 DIAGNOSIS — M25519 Pain in unspecified shoulder: Secondary | ICD-10-CM | POA: Diagnosis not present

## 2016-05-08 DIAGNOSIS — M19011 Primary osteoarthritis, right shoulder: Secondary | ICD-10-CM | POA: Diagnosis not present

## 2016-05-08 DIAGNOSIS — M25562 Pain in left knee: Secondary | ICD-10-CM | POA: Diagnosis not present

## 2016-05-16 DIAGNOSIS — M1712 Unilateral primary osteoarthritis, left knee: Secondary | ICD-10-CM | POA: Diagnosis not present

## 2016-05-16 DIAGNOSIS — M25561 Pain in right knee: Secondary | ICD-10-CM | POA: Diagnosis not present

## 2016-05-16 DIAGNOSIS — M25562 Pain in left knee: Secondary | ICD-10-CM | POA: Diagnosis not present

## 2016-05-16 DIAGNOSIS — R2689 Other abnormalities of gait and mobility: Secondary | ICD-10-CM | POA: Diagnosis not present

## 2016-05-16 DIAGNOSIS — M170 Bilateral primary osteoarthritis of knee: Secondary | ICD-10-CM | POA: Diagnosis not present

## 2016-05-23 DIAGNOSIS — M25562 Pain in left knee: Secondary | ICD-10-CM | POA: Diagnosis not present

## 2016-05-23 DIAGNOSIS — R2689 Other abnormalities of gait and mobility: Secondary | ICD-10-CM | POA: Diagnosis not present

## 2016-05-23 DIAGNOSIS — M1711 Unilateral primary osteoarthritis, right knee: Secondary | ICD-10-CM | POA: Diagnosis not present

## 2016-05-23 DIAGNOSIS — M170 Bilateral primary osteoarthritis of knee: Secondary | ICD-10-CM | POA: Diagnosis not present

## 2016-05-23 DIAGNOSIS — M25561 Pain in right knee: Secondary | ICD-10-CM | POA: Diagnosis not present

## 2016-05-31 DIAGNOSIS — M25562 Pain in left knee: Secondary | ICD-10-CM | POA: Diagnosis not present

## 2016-05-31 DIAGNOSIS — M1712 Unilateral primary osteoarthritis, left knee: Secondary | ICD-10-CM | POA: Diagnosis not present

## 2016-06-06 DIAGNOSIS — M25561 Pain in right knee: Secondary | ICD-10-CM | POA: Diagnosis not present

## 2016-06-06 DIAGNOSIS — M1711 Unilateral primary osteoarthritis, right knee: Secondary | ICD-10-CM | POA: Diagnosis not present

## 2016-06-11 DIAGNOSIS — M1712 Unilateral primary osteoarthritis, left knee: Secondary | ICD-10-CM | POA: Diagnosis not present

## 2016-06-11 DIAGNOSIS — M25562 Pain in left knee: Secondary | ICD-10-CM | POA: Diagnosis not present

## 2016-06-19 DIAGNOSIS — M25561 Pain in right knee: Secondary | ICD-10-CM | POA: Diagnosis not present

## 2016-06-19 DIAGNOSIS — M1711 Unilateral primary osteoarthritis, right knee: Secondary | ICD-10-CM | POA: Diagnosis not present

## 2016-06-27 DIAGNOSIS — M25562 Pain in left knee: Secondary | ICD-10-CM | POA: Diagnosis not present

## 2016-06-27 DIAGNOSIS — M25561 Pain in right knee: Secondary | ICD-10-CM | POA: Diagnosis not present

## 2016-06-27 DIAGNOSIS — M170 Bilateral primary osteoarthritis of knee: Secondary | ICD-10-CM | POA: Diagnosis not present

## 2016-07-22 ENCOUNTER — Other Ambulatory Visit: Payer: Self-pay | Admitting: Cardiovascular Disease

## 2016-07-22 ENCOUNTER — Telehealth: Payer: Self-pay | Admitting: *Deleted

## 2016-07-22 NOTE — Telephone Encounter (Signed)
ERROR

## 2016-07-22 NOTE — Telephone Encounter (Signed)
Rx request sent to pharmacy.  

## 2016-07-26 DIAGNOSIS — Z791 Long term (current) use of non-steroidal anti-inflammatories (NSAID): Secondary | ICD-10-CM | POA: Diagnosis not present

## 2016-07-26 DIAGNOSIS — M25512 Pain in left shoulder: Secondary | ICD-10-CM | POA: Diagnosis not present

## 2016-07-26 DIAGNOSIS — G894 Chronic pain syndrome: Secondary | ICD-10-CM | POA: Diagnosis not present

## 2016-07-26 DIAGNOSIS — M961 Postlaminectomy syndrome, not elsewhere classified: Secondary | ICD-10-CM | POA: Diagnosis not present

## 2016-09-16 ENCOUNTER — Other Ambulatory Visit: Payer: Self-pay | Admitting: Cardiovascular Disease

## 2016-09-16 NOTE — Telephone Encounter (Signed)
Rx request sent to pharmacy.  

## 2016-09-19 DIAGNOSIS — M19011 Primary osteoarthritis, right shoulder: Secondary | ICD-10-CM | POA: Diagnosis not present

## 2016-09-19 DIAGNOSIS — M25519 Pain in unspecified shoulder: Secondary | ICD-10-CM | POA: Diagnosis not present

## 2016-10-10 DIAGNOSIS — M25562 Pain in left knee: Secondary | ICD-10-CM | POA: Diagnosis not present

## 2016-10-10 DIAGNOSIS — M25561 Pain in right knee: Secondary | ICD-10-CM | POA: Diagnosis not present

## 2016-10-10 DIAGNOSIS — M170 Bilateral primary osteoarthritis of knee: Secondary | ICD-10-CM | POA: Diagnosis not present

## 2016-10-22 DIAGNOSIS — M1712 Unilateral primary osteoarthritis, left knee: Secondary | ICD-10-CM | POA: Diagnosis not present

## 2016-10-22 DIAGNOSIS — M25562 Pain in left knee: Secondary | ICD-10-CM | POA: Diagnosis not present

## 2016-10-30 ENCOUNTER — Ambulatory Visit (INDEPENDENT_AMBULATORY_CARE_PROVIDER_SITE_OTHER): Payer: Self-pay | Admitting: Nurse Practitioner

## 2016-10-30 ENCOUNTER — Ambulatory Visit (INDEPENDENT_AMBULATORY_CARE_PROVIDER_SITE_OTHER): Payer: Medicare Other | Admitting: Physician Assistant

## 2016-10-30 ENCOUNTER — Encounter: Payer: Self-pay | Admitting: Nurse Practitioner

## 2016-10-30 VITALS — BP 184/86 | HR 71 | Temp 98.0°F | Resp 18 | Ht 63.0 in | Wt 179.2 lb

## 2016-10-30 VITALS — BP 172/83 | HR 79 | Ht 63.0 in | Wt 167.2 lb

## 2016-10-30 DIAGNOSIS — Z79899 Other long term (current) drug therapy: Secondary | ICD-10-CM | POA: Diagnosis not present

## 2016-10-30 DIAGNOSIS — G894 Chronic pain syndrome: Secondary | ICD-10-CM | POA: Diagnosis not present

## 2016-10-30 DIAGNOSIS — G4710 Hypersomnia, unspecified: Secondary | ICD-10-CM

## 2016-10-30 DIAGNOSIS — R030 Elevated blood-pressure reading, without diagnosis of hypertension: Secondary | ICD-10-CM

## 2016-10-30 DIAGNOSIS — I10 Essential (primary) hypertension: Secondary | ICD-10-CM

## 2016-10-30 DIAGNOSIS — F419 Anxiety disorder, unspecified: Secondary | ICD-10-CM | POA: Diagnosis not present

## 2016-10-30 DIAGNOSIS — G4719 Other hypersomnia: Secondary | ICD-10-CM

## 2016-10-30 MED ORDER — AMPHETAMINE-DEXTROAMPHETAMINE 15 MG PO TABS: 15.0000 mg | ORAL_TABLET | Freq: Every day | ORAL | 0 refills | Status: DC

## 2016-10-30 NOTE — Patient Instructions (Addendum)
Please follow-up in one week to assess your medication adjustment. Please call cardiologist as your blood pressure today is 184/86.  Thank you for coming in today. I hope you feel we met your needs.  Feel free to call [facility] if you have any questions or further requests.  Please consider signing up for MyChart if you do not already have it, as this is a great way to communicate with me.  Best,  Whitney McVey, PA-C     IF you received an x-ray today, you will receive an invoice from [facility]. Please contact [facility] at [phone] with questions or concerns regarding your invoice.   IF you received labwork today, you will receive an invoice from [employer]. Please contact [facility] at [phone] with questions or concerns regarding your invoice.   Our billing staff will not be able to assist you with questions regarding bills from these companies.  You will be contacted with the lab results as soon as they are available. The fastest way to get your results is to activate your My Chart account. Instructions are located on the last page of this paperwork. If you have not heard from [place] regarding the results in 2 weeks, please contact this office.

## 2016-10-30 NOTE — Progress Notes (Signed)
[name]  MRN: [id number] DOB: 1941-02-04  PCP: Gretel AcreNNODI, ADAKU, MD  Subjective:  Pt is a 75 year old female, PHM HTN, anxiety, angioplasty and stenting, chronic pain syndrome, who presents to clinic for medication refill. She is here today with her husband.   History of Anxiety - Her PCP was Dr. Modesto CharonWong. She is no longer with him. Is interested in perhaps establishing care here.  Takes Adderall 20mg  - she takes 10mg  with breakfast and 10 mg at lunch. She says this dose has worked for her in the past, however she is no longer satisfied with it, as she gets anxious and sleepy more often throughout the day now.   CAD and HTN - Sees cardiologist regularly. Her blood pressure today is 184/86. She  Chronic pain syndrome - Is followed by pain management clinic.   Excessive sleepiness - She is interested in trying narcolepsy medication.   Review of Systems  Constitutional: Positive for fatigue. Negative for chills, diaphoresis and fever.  HENT: Negative for congestion, postnasal drip, rhinorrhea, sinus pressure, sneezing and sore throat.   Respiratory: Negative for cough, chest tightness, shortness of breath and wheezing.   Cardiovascular: Negative for chest pain and palpitations.  Gastrointestinal: Negative for abdominal pain, diarrhea, nausea and vomiting.  Neurological: Negative for weakness, light-headedness and headaches.  Psychiatric/Behavioral: Positive for sleep disturbance. The patient is nervous/anxious.     Patient Active Problem List   Diagnosis Date Noted  . Narcotic addiction (HCC) 12/05/2014  . Excessive daytime sleepiness 12/05/2014  . Alteration in psychomotor activity 12/05/2014  . Depression with somatization 12/05/2014  . Hypersomnia, persistent 09/10/2013  . Obesity, unspecified 09/10/2013  . Chronic pain syndrome 09/10/2013  . Depression 07/31/2013  . Nausea alone 07/19/2013  . Medication intolerance 07/19/2013  . Anxiety 07/19/2013  . CAD (coronary artery  disease) status post stent to distal right coronary artery 2006 06/20/2013  . Neurocardiogenic syncope 06/20/2013  . Essential hypertension 06/20/2013  . Hyperlipidemia 06/20/2013    Current Outpatient Prescriptions on File Prior to Visit  Medication Sig Dispense Refill  . amphetamine-dextroamphetamine (ADDERALL) 20 MG tablet [facility]. Po. (Patient taking differently: Take one tablet in the AM and two in the PM.) 30 tablet 0  . aspirin 81 MG tablet Take 81 mg by mouth daily.    Marland Kitchen. atenolol (TENORMIN) 25 MG tablet Take 1 tab by mouth in the morning and 2 tab in the evening. Please schedule appointment for refills. 270 tablet 0  . diazepam (VALIUM) 5 MG tablet Take 5 mg by mouth every 12 (twelve) hours as needed.     . fexofenadine (ALLEGRA) 180 MG tablet Take 180 mg by mouth daily as needed.     Marland Kitchen. FLUoxetine (PROZAC) 20 MG capsule Take 1 capsule (20 mg total) by mouth 2 (two) times daily. <please get future refills from PCP> 60 capsule 0  . hydrochlorothiazide (HYDRODIURIL) 25 MG tablet     . HYDROcodone-acetaminophen (NORCO) 10-325 MG per tablet Take 1-2 tablets by mouth as needed.     Marland Kitchen. levothyroxine (SYNTHROID, LEVOTHROID) 50 MCG tablet Take 50 mcg by mouth daily before breakfast.    . lisinopril (PRINIVIL,ZESTRIL) 20 MG tablet Take 1 tab by mouth in the morning and 1/2 tab at bedtime. Please schedule appointment for refills. 135 tablet 0  . ondansetron (ZOFRAN) 8 MG tablet Take 1 tablet (8 mg total) by mouth every 12 (twelve) hours as needed for nausea. 10  tablet 0  . potassium chloride (K-DUR,KLOR-CON) 10 MEQ tablet Take 10 mEq by mouth as needed (only takes if she dont eat potassium rich foods).      No current facility-administered medications on file prior to visit.     Allergies  Allergen Reactions  . Cardizem [Diltiazem Hcl]     nausea  . Codeine     Feeling weird   . Coreg [Carvedilol]   . Demerol [Meperidine]     unknown  .  Erythromycin     Very sick  . Inderal [Propranolol]     Made me cry  . Lisinopril Cough  . Procardia [Nifedipine]   . Wellbutrin [Bupropion]   . Zoloft [Sertraline Hcl]     Could not talk     Objective:  BP (!) 184/86 (BP Location: Left Arm, Patient Position: Sitting, Cuff Size: Large)   Pulse 71   Temp 98 F (36.7 C) (Oral)   Resp 18   Ht 5\' 3"  (1.6 m)   Wt 179 lb 3.2 oz (81.3 kg)   SpO2 97%   BMI 31.74 kg/m   Physical Exam  Constitutional: She is oriented to person, place, and time and well-developed, well-nourished, and in no distress. No distress.  Cardiovascular: Normal rate, regular rhythm and normal heart sounds.   Pulmonary/Chest: Effort normal. No respiratory distress.  Neurological: She is alert and oriented to person, place, and time. GCS score is 15.  Skin: Skin is warm and dry.  Psychiatric: Mood, memory and judgment normal. She has a flat affect.  Vitals reviewed.   Assessment and Plan :  1. Anxiety - amphetamine-dextroamphetamine (ADDERALL) 15 MG tablet; Take 1 tablet by mouth daily before breakfast.  Dispense: 30 tablet; Refill: 0 - amphetamine-dextroamphetamine (ADDERALL) 15 MG tablet; Take 1 tablet by mouth daily before breakfast.  Dispense: 30 tablet; Refill: 0 - amphetamine-dextroamphetamine (ADDERALL) 15 MG tablet; Take 1 tablet by mouth daily before breakfast.  Dispense: 30 tablet; Refill: 0 - Will try Adderall XR as her current dose of 10mg  in the morning and 10mg  with lunch is not working for her. RTC in two weeks for follow-up to discuss how she is feeling with the dose change.   2. Essential hypertension 3. Elevated blood pressure reading - Discussed with patient need to control blood pressure. She states she has an appointment with her cardiologist and will discuss it with him. 4. Hypersomnia, persistent 5. Chronic pain syndrome 6. Encounter for medication management   Marco CollieWhitney Colsen Modi, PA-C  [facility] 10/30/2016 2:47 PM

## 2016-10-30 NOTE — Progress Notes (Signed)
[facility]  PATIENT: [name] DOB: Apr 08, 1941   REASON FOR VISIT: Medication management HISTORY FROM: Patient    HISTORY OF PRESENT ILLNESS: Ms. [name], 75 year old female returns for follow-up after two-year absence for medication management. She is currently on Adderall and was made aware at her last visit by Dr. Vickey Williams that she would not prescribe that medication for her. She referred her to psychiatry at that time . She was also made aware that we would not treat her chronic pain or non organic sleep disorder. She tells me today she does not have a primary care She was made aware that she can go to urgent care. There will be no charge for this visit  [name] is a 75 y.o. female , married, caucasian and right handed. She is  seen here  Upon referral by Dr. Ihor Williams for  Evaluation of hypersomnia.  Mrs.  Dennison Williams recalled having seen me briefly in 2006-7 , and she reported that she was sick and fatigued at the time. She has been fatigued ever since she had an [name] Barr Virus infection in 1998 and she required hospitalization, spinal tap and had a diagnosis of viral meningitis in 2005  .  I had started her on Concerta in 2006 and she felt this helped her fatigue without causing cardiac or BP Problems. [name] has had meanwhile returned from [place],  where her husband's work had transferred the family for 7 years . She has not experienced any syncope,  but she states that she feels reasonably  little better since being on stimulants and PROZAC.  This was also given to control hot flashes, the previous medication tried for the same symptoms was Effexor,  but didn't work as well. He is still doing the very best on prozac and stimulants,  With careful monitoring of her cardiac CA- disease. She had a cardiac catheterization in 2008 she has documented coronary artery disease and had undergone a total of 9 angioplasties.  She is established mild was able  cardiologist, Dr.Mihai  [facility]  [facility] .   I had last seen [name] several years ago and she has had progressive problems with balance, gait stability, fluent movements. She has the longest list of medication allergies and an extremely long ROS.  She is depressed, in chrooic pain and under the influence of several strong pain medications. She reports  Regret about missing out on the grand childrens upbringing, had little contact. She left her husband until he suffered a heart attack , and returned. She lost her mother.  She moved back to [place] in 2014 .     REVIEW OF SYSTEMS: Full 14 system review of systems performed and notable only for those listed, all others are neg:  Constitutional: neg  Cardiovascular: neg Ear/Nose/Throat: neg  Skin: neg Eyes: neg Respiratory: neg Gastroitestinal: neg  Hematology/Lymphatic: neg  Endocrine: neg Musculoskeletal:neg Allergy/Immunology: neg Neurological: neg Psychiatric: neg Sleep : neg   ALLERGIES: Allergies  Allergen Reactions  . Cardizem [Diltiazem Hcl]     nausea  . Codeine     Feeling weird   . Coreg [Carvedilol]   . Demerol [Meperidine]     unknown  . Erythromycin     Very sick  . Inderal [Propranolol]     Made me cry  . Lisinopril Cough  . Procardia [Nifedipine]   . Wellbutrin [Bupropion]   . Zoloft [Sertraline Hcl]     Could not talk  HOME MEDICATIONS: Outpatient Medications Prior to Visit  Medication Sig Dispense Refill  . amphetamine-dextroamphetamine (ADDERALL) 20 MG tablet One half tab in AM and one half tab at [facility] time. Po. (Patient taking differently: Take one tablet in the AM and two in the PM.) 30 tablet 0  . aspirin 81 MG tablet Take 81 mg by mouth daily.    Marland Kitchen atenolol (TENORMIN) 25 MG tablet Take 1 tab by mouth in the morning and 2 tab in the evening. Please schedule appointment for refills. 270 tablet 0  . diazepam (VALIUM) 5 MG tablet Take 5 mg by mouth every 12  (twelve) hours as needed.     . fexofenadine (ALLEGRA) 180 MG tablet Take 180 mg by mouth daily as needed.     Marland Kitchen FLUoxetine (PROZAC) 20 MG capsule Take 1 capsule (20 mg total) by mouth 2 (two) times daily. <please get future refills from PCP> 60 capsule 0  . hydrochlorothiazide (MICROZIDE) 12.5 MG capsule Take 1 capsule (12.5 mg total) by mouth daily. 30 capsule 6  . HYDROcodone-acetaminophen (NORCO) 10-325 MG per tablet Take 1-2 tablets by mouth as needed.     Marland Kitchen levothyroxine (SYNTHROID, LEVOTHROID) 50 MCG tablet Take 50 mcg by mouth daily before breakfast.    . lisinopril (PRINIVIL,ZESTRIL) 20 MG tablet Take 1 tab by mouth in the morning and 1/2 tab at bedtime. Please schedule appointment for refills. 135 tablet 0  . ondansetron (ZOFRAN) 8 MG tablet Take 1 tablet (8 mg total) by mouth every 12 (twelve) hours as needed for nausea. 10 tablet 0  . potassium chloride (K-DUR,KLOR-CON) 10 MEQ tablet Take 10 mEq by mouth as needed (only takes if she dont eat potassium rich foods).     . ezetimibe-simvastatin (VYTORIN) 10-40 MG per tablet Take 1 tablet by mouth daily. 90 tablet 3  . methocarbamol (ROBAXIN) 500 MG tablet Take 500 mg by mouth as needed.     Marland Kitchen morphine (MSIR) 15 MG tablet Take 15 mg by mouth every 6 (six) hours as needed for pain.    . traMADol (ULTRAM) 50 MG tablet Take 50-100 mg by mouth every 6 (six) hours as needed.      No facility-administered medications prior to visit.     PAST MEDICAL HISTORY: Past Medical History:  Diagnosis Date  . Abdominal discomfort    "due to medication intolerance"  . CAD (coronary artery disease)    previous stent  . Chronic pain syndrome 09/10/2013   Dr [name],   . Excessive daytime sleepiness 12/05/2014   Patient has not been seen for a sleep study as ordered, and used adderall to keep alert in daytime.  She agreed  In a contract not to have any scheduled medication for pain treatment from [facility] and will not receive refills for Adderall, which was  initiated by dr Kathleen Dow, PCP>   . Hyperlipemia   . Hypersomnia, persistent 09/10/2013   Patient on  Stimulants .  Marland Kitchen Hypertension   . Narcotic addiction (HCC) 12/05/2014  . Obesity, unspecified 09/10/2013  . Shoulder joint pain    both shoulders    PAST SURGICAL HISTORY: Past Surgical History:  Procedure Laterality Date  . ANGIOPLASTY  03/01/2002   cutting balloon mid RCA  . BACK SURGERY    . CARDIAC CATHETERIZATION  01/26/2007   mild diffuse CAD  . CARDIAC CATHETERIZATION  10/23/2009   nonobstructive CAD,60% prox RCA,50% prox LAD, 50% mid ramus  . CORONARY STENT PLACEMENT  03/15/2005   distal RCA  .  NEPHRECTOMY      FAMILY HISTORY: Family History  Problem Relation Age of Onset  . Hypertension Mother   . Diabetes Father   . Heart attack Father   . Heart attack Brother     SOCIAL HISTORY: Social History   Social History  . Marital status: Married    Spouse name: N/A  . Number of children: N/A  . Years of education: N/A   Occupational History  . N/A    Social History Main Topics  . Smoking status: Never Smoker  . Smokeless tobacco: Never Used  . Alcohol use No  . Drug use: No  . Sexual activity: Not on file   Other Topics Concern  . Not on file   Social History Narrative   Caffeine      PHYSICAL EXAM  Vitals:   10/30/16 1249  Weight: 167 lb 3.2 oz (75.8 kg)  Height: 5' (1.524 Williams)   Body mass index is 32.65 kg/Williams.  DIAGNOSTIC DATA (LABS, IMAGING, TESTING) -   Lab Results  Component Value Date   HGBA1C 7.6 (H) 07/19/2013    ASSESSMENT AND PLAN No charge visit.    Nilda RiggsNancy Carolyn Mandy Peeks, [facility]NP, [facility], APRN  [facility] [address], [address] [place], [place] [id number] [phone]

## 2016-10-31 DIAGNOSIS — M25561 Pain in right knee: Secondary | ICD-10-CM | POA: Diagnosis not present

## 2016-10-31 DIAGNOSIS — M1711 Unilateral primary osteoarthritis, right knee: Secondary | ICD-10-CM | POA: Diagnosis not present

## 2016-11-05 DIAGNOSIS — M1712 Unilateral primary osteoarthritis, left knee: Secondary | ICD-10-CM | POA: Diagnosis not present

## 2016-11-05 DIAGNOSIS — M25562 Pain in left knee: Secondary | ICD-10-CM | POA: Diagnosis not present

## 2016-11-08 ENCOUNTER — Ambulatory Visit: Payer: Medicare Other

## 2016-11-08 ENCOUNTER — Telehealth: Payer: Self-pay | Admitting: Family Medicine

## 2016-11-08 NOTE — Telephone Encounter (Signed)
Pt calling to cancell her appt she states that she had fell a few days ago and that she hurt her elbow knee and some confusion she hit the floor so hard she can move her husband couldn't pick her up so she made it to the bed so I advised her to call EMS and they will take her to the hospital to be seen pt agrees

## 2016-11-20 ENCOUNTER — Ambulatory Visit (INDEPENDENT_AMBULATORY_CARE_PROVIDER_SITE_OTHER): Payer: Medicare Other | Admitting: Physician Assistant

## 2016-11-20 VITALS — BP 154/72 | HR 69 | Temp 97.8°F | Ht 63.0 in | Wt 183.8 lb

## 2016-11-20 DIAGNOSIS — Z9189 Other specified personal risk factors, not elsewhere classified: Secondary | ICD-10-CM | POA: Diagnosis not present

## 2016-11-20 DIAGNOSIS — Z9181 History of falling: Secondary | ICD-10-CM | POA: Diagnosis not present

## 2016-11-20 DIAGNOSIS — Z79899 Other long term (current) drug therapy: Secondary | ICD-10-CM | POA: Diagnosis not present

## 2016-11-20 DIAGNOSIS — Z23 Encounter for immunization: Secondary | ICD-10-CM

## 2016-11-20 DIAGNOSIS — F419 Anxiety disorder, unspecified: Secondary | ICD-10-CM | POA: Diagnosis not present

## 2016-11-20 MED ORDER — ROLLER WALKER MISC: 0 refills | Status: DC

## 2016-11-20 NOTE — Patient Instructions (Addendum)
Please schedule an annual exam with me as soon as possible.   Go to [facility] and get a walker. [address], [place], [place] [address] [phone] Take your prescription for your walker there and fill it.   Thank you for coming in today. I hope you feel we met your needs.  Feel free to call [facility] if you have any questions or further requests.  Please consider signing up for MyChart if you do not already have it, as this is a great way to communicate with me.  Best,  Whitney McVey, PA-C   IF you received an x-ray today, you will receive an invoice from [facility]. Please contact [facility] at [phone] with questions or concerns regarding your invoice.   IF you received labwork today, you will receive an invoice from [facility]. Please contact [facility] at [phone] with questions or concerns regarding your invoice.   Our billing staff will not be able to assist you with questions regarding bills from these companies.  You will be contacted with the lab results as soon as they are available. The fastest way to get your results is to activate your My Chart account. Instructions are located on the last page of this paperwork. If you have not heard from [place] regarding the results in 2 weeks, please contact this office.

## 2016-11-20 NOTE — Progress Notes (Signed)
[name]  MRN: [id number] DOB: 1941-03-26  PCP: Gretel AcreNNODI, ADAKU, MD  Subjective:  Pt is a 75 year old female who presents to clinic for follow-up medication management.  She was here 20 days ago and switched to Adderall 15mg  Xr. She likes how it feels, however it does not last long enough.  Takes one at 8 am and another at 12pm - this schedule does not work well. A few times she took another dose at 3pm and this worked well. She was able to go to bed on time.    She has had two falls since she was here last. She was trying to sit in a chair in her living room but tripped and fell onto a small table. Her husband was not at home at the time. He came home to find her on the floor. He cannot help her get up as he has had several heart surgeries and cannot strain himself. After some struggle, she was able to help herself up.  She is interested in narcolepsy medication. Need to check prices of medications.   Chronic pain syndrome - Dr. Ethelene Halamos. She has appt tomorrow.  History of Chronic fatigue syndrome - Followed by neurology. She has tried ultrasound and heat therapy in the past - worked well.  Her former PCP moved back to [place]. She has not had an annual exam in several years.   She would also like ears cleaned out.   Review of Systems  Constitutional: Negative for chills, diaphoresis, fatigue and fever.  HENT: Negative for congestion, postnasal drip, rhinorrhea, sinus pressure, sneezing and sore throat.   Respiratory: Negative for cough, chest tightness, shortness of breath and wheezing.   Cardiovascular: Negative for chest pain and palpitations.  Gastrointestinal: Negative for abdominal pain, diarrhea, nausea and vomiting.  Neurological: Negative for weakness, light-headedness and headaches.    Patient Active Problem List   Diagnosis Date Noted  . Narcotic addiction (HCC) 12/05/2014  . Excessive daytime sleepiness 12/05/2014  . Alteration in psychomotor activity 12/05/2014  .  Depression with somatization 12/05/2014  . Hypersomnia, persistent 09/10/2013  . Obesity, unspecified 09/10/2013  . Chronic pain syndrome 09/10/2013  . Depression 07/31/2013  . Nausea alone 07/19/2013  . Medication intolerance 07/19/2013  . Anxiety 07/19/2013  . CAD (coronary artery disease) status post stent to distal right coronary artery 2006 06/20/2013  . Neurocardiogenic syncope 06/20/2013  . Essential hypertension 06/20/2013  . Hyperlipidemia 06/20/2013    Current Outpatient Prescriptions on File Prior to Visit  Medication Sig Dispense Refill  . amphetamine-dextroamphetamine (ADDERALL) 15 MG tablet Take 1 tablet by mouth daily before breakfast. 30 tablet 0  . [START ON 11/30/2016] amphetamine-dextroamphetamine (ADDERALL) 15 MG tablet Take 1 tablet by mouth daily before breakfast. 30 tablet 0  . [START ON 12/30/2016] amphetamine-dextroamphetamine (ADDERALL) 15 MG tablet Take 1 tablet by mouth daily before breakfast. 30 tablet 0  . aspirin 81 MG tablet Take 81 mg by mouth daily.    Marland Kitchen. atenolol (TENORMIN) 25 MG tablet Take 1 tab by mouth in the morning and 2 tab in the evening. Please schedule appointment for refills. 270 tablet 0  . diazepam (VALIUM) 5 MG tablet Take 5 mg by mouth every 12 (twelve) hours as needed.     . fexofenadine (ALLEGRA) 180 MG tablet Take 180 mg by mouth daily as needed.     Marland Kitchen. FLUoxetine (PROZAC) 20 MG capsule Take 1 capsule (20 mg total) by mouth 2 (two) times daily. <please get future refills  from PCP> 60 capsule 0  . hydrochlorothiazide (HYDRODIURIL) 25 MG tablet     . HYDROcodone-acetaminophen (NORCO) 10-325 MG per tablet Take 1-2 tablets by mouth as needed.     Marland Kitchen. levothyroxine (SYNTHROID, LEVOTHROID) 50 MCG tablet Take 50 mcg by mouth daily before breakfast.    . lisinopril (PRINIVIL,ZESTRIL) 20 MG tablet Take 1 tab by mouth in the morning and 1/2 tab at bedtime. Please schedule appointment for refills. 135 tablet 0  . ondansetron (ZOFRAN) 8 MG tablet Take 1  tablet (8 mg total) by mouth every 12 (twelve) hours as needed for nausea. 10 tablet 0  . potassium chloride (K-DUR,KLOR-CON) 10 MEQ tablet Take 10 mEq by mouth as needed (only takes if she dont eat potassium rich foods).      No current facility-administered medications on file prior to visit.     Allergies  Allergen Reactions  . Cardizem [Diltiazem Hcl]     nausea  . Codeine     Feeling weird   . Coreg [Carvedilol]   . Demerol [Meperidine]     unknown  . Erythromycin     Very sick  . Inderal [Propranolol]     Made me cry  . Lisinopril Cough  . Procardia [Nifedipine]   . Wellbutrin [Bupropion]   . Zoloft [Sertraline Hcl]     Could not talk     Objective:  BP (!) 154/72 (BP Location: Left Arm, Patient Position: Sitting, Cuff Size: Small)   Pulse 69   Temp 97.8 F (36.6 C) (Oral)   Ht 5\' 3"  (1.6 m)   Wt 183 lb 12.8 oz (83.4 kg)   BMI 32.56 kg/m   Physical Exam  Constitutional: She is oriented to person, place, and time and well-developed, well-nourished, and in no distress. No distress.  HENT:  Head: Head is with contusion (Right cheek. Healing).  Cardiovascular: Normal rate, regular rhythm and normal heart sounds.   Pulmonary/Chest: No respiratory distress.  Neurological: She is alert and oriented to person, place, and time. GCS score is 15.  Skin: Skin is warm and dry.  Psychiatric: Mood, memory, affect and judgment normal.  Vitals reviewed.   Assessment and Plan :  1. Encounter for medication management 2. History of fall 3. At risk for polypharmacy - Pt is on multiple medications and has recently suffered two falls. Consider referral to Rheumatology in the future.  4. Need for prophylactic vaccination and inoculation against influenza - Flu Vaccine QUAD 36+ mos IM  5. Anxiety - amphetamine-dextroamphetamine (ADDERALL) 15 MG tablet; Take 15 -30mg  PO daily  Dispense: 60 tablet; Refill: 0 - Will change dosing of Adderall as she is taking two 15mg  a day. RTC  in 4 weeks.       Marco CollieWhitney Cincere Deprey, PA-C  [facility] 11/20/2016 6:16 PM

## 2016-11-21 ENCOUNTER — Telehealth: Payer: Self-pay

## 2016-11-21 MED ORDER — AMPHETAMINE-DEXTROAMPHETAMINE 15 MG PO TABS: ORAL_TABLET | ORAL | 0 refills | Status: DC

## 2016-11-21 NOTE — Telephone Encounter (Signed)
I spoke with husband as he walked in. He states they are 10 pills short on add med. I called pharmacy and confirmed they got a full rx 10/30/16 and have not been early before . I spoke with whitney and she had an appt. With Mccayla yesterday where Marybel admitted to taking add meds 1-2 per day (so is short) A new rx was written and given to husband for 12 per day #60. He asked about a diazepam rx as well and whitney advised they did not discuss at ov and will at next visit

## 2016-11-21 NOTE — Telephone Encounter (Signed)
Pt husband is needing to talk with someone about the adderall rx that was given to his wife-it states to fill on 11/30/16 but pt is completely out   Best number [phone]

## 2016-11-26 ENCOUNTER — Telehealth: Payer: Self-pay

## 2016-11-26 DIAGNOSIS — Z9181 History of falling: Secondary | ICD-10-CM

## 2016-11-26 NOTE — Telephone Encounter (Signed)
Husband turned in a rx for rolling walker but needs to include seat

## 2016-11-26 NOTE — Telephone Encounter (Signed)
Requesting the script for the rolling walker has a seat.    Please call  [phone] or [phone]  Reuel BoomDaniel

## 2016-11-28 ENCOUNTER — Telehealth: Payer: Self-pay

## 2016-11-28 NOTE — Telephone Encounter (Signed)
Pt's husband is calling to see about a form that he dropped off about getting walker with a seat on it  Best number [phone]

## 2016-11-29 MED ORDER — HUGO ROLLING WALKER ELITE MISC: 1.0000 [IU] | Freq: Every day | 0 refills | Status: AC | PRN

## 2016-11-29 MED ORDER — HUGO ROLLING WALKER ELITE MISC: 1.0000 [IU] | Freq: Every day | 0 refills | Status: DC | PRN

## 2016-11-29 NOTE — Telephone Encounter (Signed)
Please call pt.  Rx for walker w/ seat is available for pick-up here at the front desk - in the folder. Thank you!

## 2016-11-29 NOTE — Addendum Note (Signed)
Addended by: Sebastian AcheMCVEY, Sheera Illingworth WHITNEY on: 11/29/2016 07:02 PM   Modules accepted: Orders

## 2016-11-30 NOTE — Telephone Encounter (Signed)
Up front, advised

## 2016-12-02 ENCOUNTER — Other Ambulatory Visit: Payer: Self-pay | Admitting: Physician Assistant

## 2016-12-02 DIAGNOSIS — R296 Repeated falls: Secondary | ICD-10-CM

## 2016-12-03 ENCOUNTER — Ambulatory Visit: Payer: Medicare Other

## 2016-12-10 ENCOUNTER — Ambulatory Visit (INDEPENDENT_AMBULATORY_CARE_PROVIDER_SITE_OTHER): Payer: Medicare Other | Admitting: Physician Assistant

## 2016-12-10 VITALS — BP 140/90 | HR 73 | Temp 97.9°F | Resp 20 | Ht 62.0 in | Wt 180.8 lb

## 2016-12-10 DIAGNOSIS — F988 Other specified behavioral and emotional disorders with onset usually occurring in childhood and adolescence: Secondary | ICD-10-CM | POA: Diagnosis not present

## 2016-12-10 DIAGNOSIS — Z79899 Other long term (current) drug therapy: Secondary | ICD-10-CM

## 2016-12-10 MED ORDER — POTASSIUM CHLORIDE CRYS ER 10 MEQ PO TBCR: 10.0000 meq | EXTENDED_RELEASE_TABLET | ORAL | 2 refills | Status: DC | PRN

## 2016-12-10 NOTE — Patient Instructions (Addendum)
Please come back for your annual exam soon.  Go home and write down a list of all of your medications, WHAT you are taking them for, HOW you are taking them (how often? What dose?), how long you have been taking them, and when you need refills. We need to draw your labs to make sure you are healthy.   Please think about participating in physical therapy to help you get stronger. This will help you reduce your falls. I will research your bed options.    Thank you for coming in today. I hope you feel we met your needs.  Feel free to call [facility] if you have any questions or further requests.  Please consider signing up for MyChart if you do not already have it, as this is a great way to communicate with me.  Best,  Whitney McVey, PA-C    IF you received an x-ray today, you will receive an invoice from [facility]. Please contact  Radiology at [phone] with questions or concerns regarding your invoice.   IF you received labwork today, you will receive an invoice from [facility]. Please contact [facility] at [phone] with questions or concerns regarding your invoice.   Our billing staff will not be able to assist you with questions regarding bills from these companies.  You will be contacted with the lab results as soon as they are available. The fastest way to get your results is to activate your My Chart account. Instructions are located on the last page of this paperwork. If you have not heard from us regarding the results in 2 weeks, please contact this office.      

## 2016-12-10 NOTE — Progress Notes (Signed)
Kathleen DeerJanice B Williams  MRN: [id number] DOB: June 23, 1941  PCP: Gretel AcreNNODI, ADAKU, MD  Subjective:  Pt is a 76 year old female PMH CAD, neurocardiogenic syncope, HTN, anxiety, HLD, depression, obesity, depression who presents to clinic for immunizations and medication management.  Her husband is AstronomerDan. He is a "very protective person" who is a "perfectionist".  History of EBV infection 1988. She believes this is when her symptoms started.   Anxiety - Adderall 15mg  RX   History of falls - She was prescribed a walker at her last appt. She would like a hospital bed. Her bed is "a tad too high" She has a difficulty time getting out of it. No falls since her last appt. Last fall was Christmas eve.  C/o weak back and pain in her low back. She has has epidural in low back and knee injections.   Interested in medication for narcolepsy.    Chronic pain syndrome - Dr. Ethelene Halamos. Last appt 12/06. He manages her medications: Norco 10-325 mg.   Chronic fatigue syndrome - Followed by neurology. Has tried ultrasound and heat therapy - worked well.   CAD - Last cardiology visit 06/2015. Has not had coronary problems in the past 10 years. Stent placement 2006 - angioplasty and stenting with LAD and RCA. Most recently in 2006 stent in right coronary artery. Last cardiac cath was in 2006, showed moderate nonobstructive disease disease involving proximal RCA, proximal LAD and proximal ramus intermedius artery. Normal nuclear stress test 2011.  Multiple work-ups from cardiology, neurology, pain specialist, orthopedist, psychiatrist.   Non-smoker.   [facility] - Formerly saw Dr. Janeice RobinsonNodi 2016. Left due to family reasons, moved back to [place]. used to see Dr. Modesto CharonWong. Doesn't like him.   Review of Systems  Constitutional: Positive for fatigue. Negative for chills, diaphoresis and fever.  HENT: Negative for congestion, postnasal drip, rhinorrhea, sinus pressure, sneezing and sore throat.   Respiratory: Negative for  cough, chest tightness, shortness of breath and wheezing.   Cardiovascular: Negative for chest pain and palpitations.  Gastrointestinal: Negative for abdominal pain, diarrhea, nausea and vomiting.  Musculoskeletal: Positive for arthralgias and myalgias.  Neurological: Positive for weakness. Negative for light-headedness and headaches.    Patient Active Problem List   Diagnosis Date Noted  . Narcotic addiction (HCC) 12/05/2014  . Excessive daytime sleepiness 12/05/2014  . Alteration in psychomotor activity 12/05/2014  . Depression with somatization 12/05/2014  . Hypersomnia, persistent 09/10/2013  . Obesity, unspecified 09/10/2013  . Chronic pain syndrome 09/10/2013  . Depression 07/31/2013  . Nausea alone 07/19/2013  . Medication intolerance 07/19/2013  . Anxiety 07/19/2013  . CAD (coronary artery disease) status post stent to distal right coronary artery 2006 06/20/2013  . Neurocardiogenic syncope 06/20/2013  . Essential hypertension 06/20/2013  . Hyperlipidemia 06/20/2013    Current Outpatient Prescriptions on File Prior to Visit  Medication Sig Dispense Refill  . amphetamine-dextroamphetamine (ADDERALL) 15 MG tablet Take 1 tablet by mouth daily before breakfast. 30 tablet 0  . [START ON 12/30/2016] amphetamine-dextroamphetamine (ADDERALL) 15 MG tablet Take 1 tablet by mouth daily before breakfast. 30 tablet 0  . amphetamine-dextroamphetamine (ADDERALL) 15 MG tablet Take 15 -30mg  PO daily 60 tablet 0  . aspirin 81 MG tablet Take 81 mg by mouth daily.    Marland Kitchen. atenolol (TENORMIN) 25 MG tablet Take 1 tab by mouth in the morning and 2 tab in the evening. Please schedule appointment for refills. 270 tablet 0  . FLUoxetine (PROZAC) 20 MG capsule Take 1 capsule (  20 mg total) by mouth 2 (two) times daily. <please get future refills from PCP> 60 capsule 0  . hydrochlorothiazide (HYDRODIURIL) 25 MG tablet     . HYDROcodone-acetaminophen (NORCO) 10-325 MG per tablet Take 1-2 tablets by mouth as  needed.     . hydrocortisone valerate cream (WESTCORT) 0.2 % Apply 1 application topically 2 (two) times daily.    Marland Kitchen levothyroxine (SYNTHROID, LEVOTHROID) 50 MCG tablet Take 50 mcg by mouth daily before breakfast.    . lisinopril (PRINIVIL,ZESTRIL) 20 MG tablet Take 1 tab by mouth in the morning and 1/2 tab at bedtime. Please schedule appointment for refills. 135 tablet 0  . Misc. Devices (HUGO ROLLING WALKER ELITE) MISC 1 Units by Does not apply route daily as needed. 1 each 0  . potassium chloride (K-DUR,KLOR-CON) 10 MEQ tablet Take 10 mEq by mouth as needed (only takes if she dont eat potassium rich foods).     . diazepam (VALIUM) 5 MG tablet Take 5 mg by mouth every 12 (twelve) hours as needed.     . fexofenadine (ALLEGRA) 180 MG tablet Take 180 mg by mouth daily as needed.     . ondansetron (ZOFRAN) 8 MG tablet Take 1 tablet (8 mg total) by mouth every 12 (twelve) hours as needed for nausea. (Patient not taking: Reported on 12/10/2016) 10 tablet 0   No current facility-administered medications on file prior to visit.     Allergies  Allergen Reactions  . Cardizem [Diltiazem Hcl]     nausea  . Codeine     Feeling weird   . Coreg [Carvedilol]   . Demerol [Meperidine]     unknown  . Erythromycin     Very sick  . Inderal [Propranolol]     Made me cry  . Lisinopril Cough  . Procardia [Nifedipine]   . Wellbutrin [Bupropion]   . Zoloft [Sertraline Hcl]     Could not talk     Objective:  BP 140/90 (BP Location: Left Arm, Patient Position: Sitting, Cuff Size: Large)   Pulse 73   Temp 97.9 F (36.6 C) (Oral)   Resp 20   Ht 5\' 2"  (1.575 m)   Wt 180 lb 12.8 oz (82 kg)   SpO2 97%   BMI 33.07 kg/m   Physical Exam  Constitutional: She is oriented to person, place, and time and well-developed, well-nourished, and in no distress. No distress.  Cardiovascular: Normal rate, regular rhythm and normal heart sounds.   Pulmonary/Chest: Effort normal. No respiratory distress.    Neurological: She is alert and oriented to person, place, and time. GCS score is 15.  Skin: Skin is warm and dry.  Psychiatric: Mood, memory, affect and judgment normal.  Vitals reviewed.   Assessment and Plan :  1. Encounter for medication management 2. Attention deficit disorder, unspecified hyperactivity presence - potassium chloride (K-DUR,KLOR-CON) 10 MEQ tablet; Take 1 tablet (10 mEq total) by mouth as needed (only takes if she dont eat potassium rich foods).  Dispense: 30 tablet; Refill: 2 - Needs TSH, BMP, lipids, potassium. She will come back for annual physical next month. She is inquiring about a bed as she has a difficulty time getting out of her own bed due to weakness. Consider referral to dietitian at next OV. Consider work-up Fibromyalgia.   Marco Collie, PA-C  [facility] 12/10/2016 5:43 PM

## 2016-12-13 ENCOUNTER — Telehealth: Payer: Self-pay

## 2016-12-13 DIAGNOSIS — Z79899 Other long term (current) drug therapy: Secondary | ICD-10-CM

## 2016-12-13 NOTE — Telephone Encounter (Signed)
Pt is needing a refill on levothyroxine, fluoxetine, hydrocortisone  And potassium  To [facility]

## 2016-12-14 NOTE — Telephone Encounter (Signed)
Last seen 12/10/16

## 2016-12-24 ENCOUNTER — Telehealth: Payer: Self-pay

## 2016-12-24 NOTE — Telephone Encounter (Signed)
Pt is needing to talk to someone regarding her adderall and extended release it isnt working    Pt has an appt on Friday with whitney  Best [phone]

## 2016-12-27 ENCOUNTER — Ambulatory Visit: Payer: Medicare Other

## 2016-12-27 ENCOUNTER — Telehealth: Payer: Self-pay

## 2016-12-27 NOTE — Telephone Encounter (Signed)
Pt is calling to check on the refill of her adderall changing it to something other than extended release   So that she can keep her appt with us on Friday   Best number [phone]  Her husband

## 2016-12-27 NOTE — Telephone Encounter (Signed)
Please advise, would you change now or wait til appt. On Friday?

## 2016-12-30 ENCOUNTER — Ambulatory Visit (INDEPENDENT_AMBULATORY_CARE_PROVIDER_SITE_OTHER): Payer: Medicare Other | Admitting: Physician Assistant

## 2016-12-30 VITALS — BP 128/80 | HR 63 | Temp 98.2°F | Resp 16

## 2016-12-30 DIAGNOSIS — G894 Chronic pain syndrome: Secondary | ICD-10-CM | POA: Diagnosis not present

## 2016-12-30 DIAGNOSIS — Z23 Encounter for immunization: Secondary | ICD-10-CM | POA: Diagnosis not present

## 2016-12-30 DIAGNOSIS — F988 Other specified behavioral and emotional disorders with onset usually occurring in childhood and adolescence: Secondary | ICD-10-CM | POA: Diagnosis not present

## 2016-12-30 DIAGNOSIS — Z9181 History of falling: Secondary | ICD-10-CM

## 2016-12-30 MED ORDER — AMPHET-DEXTROAMPHET 3-BEAD ER 12.5 MG PO CP24: 12.0000 mg | ORAL_CAPSULE | ORAL | 0 refills | Status: DC
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
## 2016-12-30 NOTE — Patient Instructions (Addendum)
Stop taking Adderall. Start Mydayis.  Please come back and see me in 1-2 weeks for follow-up on this new medication.   Thank you for coming in today. I hope you feel we met your needs.  Feel free to call [facility] if you have any questions or further requests.  Please consider signing up for MyChart if you do not already have it, as this is a great way to communicate with me.  Best,  Whitney McVey, PA-C   IF you received an x-ray today, you will receive an invoice from [facility]. Please contact [facility] at [phone] with questions or concerns regarding your invoice.   IF you received labwork today, you will receive an invoice from [facility]. Please contact [facility] at [phone] with questions or concerns regarding your invoice.   Our billing staff will not be able to assist you with questions regarding bills from these companies.  You will be contacted with the lab results as soon as they are available. The fastest way to get your results is to activate your My Chart account. Instructions are located on the last page of this paperwork. If you have not heard from us regarding the results in 2 weeks, please contact this office.      

## 2016-12-31 NOTE — Telephone Encounter (Signed)
PT received prescription for mydayis and wanted to know if we had a coupon for it to make it cheaper. Pt needs a prior autho for a cheaper medication  Please advise: [phone] or [phone]

## 2017-01-03 ENCOUNTER — Telehealth: Payer: Self-pay

## 2017-01-03 NOTE — Telephone Encounter (Signed)
Pt would like to know if she can get a 30mg  time release of adderall instead of the 15mg . She states that the 15mg  does not work quickly enough for her.   She also mentioned that the Mydayis is too expensive, and she is wondering if we can get something else approved. She has scheduled a follow up with Whitney but would like a call about the meds. The follow up was meant to see how she does on this new rx, but she has not started it yet as it is too expensive.

## 2017-01-03 NOTE — Telephone Encounter (Signed)
Patients needs a prior auth for her Mydais medication she has been waiting for a week now and hasn't heard anything and needs this medication.

## 2017-01-03 NOTE — Telephone Encounter (Signed)
Pt would like to know if she can get a 30mg  time release of adderall instead of the 15mg .

## 2017-01-04 NOTE — Telephone Encounter (Signed)
Please advise 

## 2017-01-07 ENCOUNTER — Other Ambulatory Visit: Payer: Self-pay | Admitting: Physician Assistant

## 2017-01-07 DIAGNOSIS — F419 Anxiety disorder, unspecified: Secondary | ICD-10-CM

## 2017-01-07 MED ORDER — AMPHETAMINE-DEXTROAMPHET ER 30 MG PO CP24: 30.0000 mg | ORAL_CAPSULE | ORAL | 0 refills | Status: DC

## 2017-01-07 NOTE — Progress Notes (Signed)
Mydayis is too expensive for pt. She would like to increase Adderall to 30mg  qd. Will Rx XR 30mg . F/u in one week.

## 2017-01-09 ENCOUNTER — Ambulatory Visit: Payer: Medicare Other | Admitting: Physician Assistant

## 2017-01-09 ENCOUNTER — Ambulatory Visit (INDEPENDENT_AMBULATORY_CARE_PROVIDER_SITE_OTHER): Payer: Medicare Other | Admitting: Physician Assistant

## 2017-01-09 ENCOUNTER — Encounter: Payer: Self-pay | Admitting: Physician Assistant

## 2017-01-09 ENCOUNTER — Telehealth: Payer: Self-pay | Admitting: Emergency Medicine

## 2017-01-09 VITALS — BP 112/74 | HR 68 | Temp 97.3°F | Resp 16 | Ht 62.0 in

## 2017-01-09 DIAGNOSIS — F909 Attention-deficit hyperactivity disorder, unspecified type: Secondary | ICD-10-CM

## 2017-01-09 DIAGNOSIS — Z79899 Other long term (current) drug therapy: Secondary | ICD-10-CM | POA: Diagnosis not present

## 2017-01-09 NOTE — Progress Notes (Signed)
[name]  MRN: [id number] DOB: 01-Jan-1941  PCP: Gretel Acre, MD  Subjective:  Pt is a 76 year old female who presents to clinic for medication management.  She has had several OV for management of her Adderall. She is a poor historian and is unsure exactly how she is taking her medication.  At a previous OV she was asked to bring in all of her medications she takes on a regular basis, however she never did this.   ADHD - Adderall originally prescribed by Dr. Vickey Huger, Neurology several years ago. She was advised to f/u for medication management with her PCP. At that time her PCP did not manage Adderall. She believes she was prescribed Concerta prior to taking Adderall. She is unsure why she was switched to Adderall, thinks it may have been her blood pressure.   She is doubling up on her Adderall 15mg , as 15mg  is not helping her anymore. Reports "crashing" in the early afternoon. She has missed a few appts due to mental fatigue and inability to stay focused and alert due to Adderall wearing off too early.   H/o chronic pain syndrome - she has f/u appts q 3 months with pain management.    Review of Systems  Constitutional: Positive for fatigue. Negative for chills, diaphoresis and fever.  HENT: Negative for congestion, postnasal drip, rhinorrhea, sinus pressure, sneezing and sore throat.   Respiratory: Negative for cough, chest tightness, shortness of breath and wheezing.   Cardiovascular: Negative for chest pain and palpitations.  Gastrointestinal: Negative for abdominal pain, diarrhea, nausea and vomiting.  Neurological: Negative for weakness, light-headedness and headaches.  Psychiatric/Behavioral: Positive for decreased concentration.    Patient Active Problem List   Diagnosis Date Noted  . ADD (attention deficit disorder) 12/30/2016  . Narcotic addiction (HCC) 12/05/2014  . Excessive daytime sleepiness 12/05/2014  . Alteration in psychomotor activity 12/05/2014  .  Depression with somatization 12/05/2014  . Hypersomnia, persistent 09/10/2013  . Obesity, unspecified 09/10/2013  . Chronic pain syndrome 09/10/2013  . Depression 07/31/2013  . Nausea alone 07/19/2013  . Medication intolerance 07/19/2013  . Anxiety 07/19/2013  . CAD (coronary artery disease) status post stent to distal right coronary artery 2006 06/20/2013  . Neurocardiogenic syncope 06/20/2013  . Essential hypertension 06/20/2013  . Hyperlipidemia 06/20/2013    Current Outpatient Prescriptions on File Prior to Visit  Medication Sig Dispense Refill  . amphetamine-dextroamphetamine (ADDERALL XR) 30 MG 24 hr capsule Take 1 capsule (30 mg total) by mouth every morning. 30 capsule 0  . amphetamine-dextroamphetamine (ADDERALL) 15 MG tablet Take 1 tablet by mouth daily before breakfast. 30 tablet 0  . aspirin 81 MG tablet Take 81 mg by mouth daily.    Marland Kitchen atenolol (TENORMIN) 25 MG tablet Take 1 tab by mouth in the morning and 2 tab in the evening. Please schedule appointment for refills. 270 tablet 0  . diazepam (VALIUM) 5 MG tablet Take 5 mg by mouth every 12 (twelve) hours as needed.     . fexofenadine (ALLEGRA) 180 MG tablet Take 180 mg by mouth daily as needed.     Marland Kitchen FLUoxetine (PROZAC) 20 MG capsule Take 1 capsule (20 mg total) by mouth 2 (two) times daily. <please get future refills from PCP> 60 capsule 0  . hydrochlorothiazide (HYDRODIURIL) 25 MG tablet     . HYDROcodone-acetaminophen (NORCO) 10-325 MG per tablet Take 1-2 tablets by mouth as needed.     . hydrocortisone valerate cream (WESTCORT) 0.2 % Apply 1 application  topically 2 (two) times daily.    Marland Kitchen. ibuprofen (ADVIL,MOTRIN) 800 MG tablet Take 800 mg by mouth every 8 (eight) hours as needed.    Marland Kitchen. levothyroxine (SYNTHROID, LEVOTHROID) 50 MCG tablet Take 50 mcg by mouth daily before breakfast.    . lisinopril (PRINIVIL,ZESTRIL) 20 MG tablet Take 1 tab by mouth in the morning and 1/2 tab at bedtime. Please schedule appointment for  refills. 135 tablet 0  . Misc. Devices (HUGO ROLLING WALKER ELITE) MISC 1 Units by Does not apply route daily as needed. 1 each 0  . ondansetron (ZOFRAN) 8 MG tablet Take 1 tablet (8 mg total) by mouth every 12 (twelve) hours as needed for nausea. 10 tablet 0  . potassium chloride (K-DUR,KLOR-CON) 10 MEQ tablet Take 1 tablet (10 mEq total) by mouth as needed (only takes if she dont eat potassium rich foods). 30 tablet 2  . Amphet-Dextroamphet 3-Bead ER (MYDAYIS) 12.5 MG CP24 Take 12 mg by mouth every morning. (Patient not taking: Reported on 01/09/2017) 30 capsule 0  . amphetamine-dextroamphetamine (ADDERALL) 15 MG tablet Take 1 tablet by mouth daily before breakfast. 30 tablet 0  . amphetamine-dextroamphetamine (ADDERALL) 15 MG tablet Take 15 -30mg  PO daily 60 tablet 0   No current facility-administered medications on file prior to visit.     Allergies  Allergen Reactions  . Cardizem [Diltiazem Hcl]     nausea  . Codeine     Feeling weird   . Coreg [Carvedilol]   . Demerol [Meperidine]     unknown  . Erythromycin     Very sick  . Inderal [Propranolol]     Made me cry  . Lisinopril Cough  . Procardia [Nifedipine]   . Wellbutrin [Bupropion]   . Zoloft [Sertraline Hcl]     Could not talk     Objective:  BP 112/74   Pulse 68   Temp 97.3 F (36.3 C) (Oral)   Resp 16   Ht 5\' 2"  (1.575 m)   SpO2 96%   Physical Exam  Constitutional: She is oriented to person, place, and time and well-developed, well-nourished, and in no distress. No distress.  Cardiovascular: Normal rate, regular rhythm and normal heart sounds.   Pulmonary/Chest: Effort normal. No respiratory distress.  Neurological: She is alert and oriented to person, place, and time. GCS score is 15.  Skin: Skin is warm and dry.  Psychiatric: Mood, affect and judgment normal. She exhibits abnormal recent memory.  Vitals reviewed.   Assessment and Plan :  1. Encounter for medication management 2. Attention deficit  hyperactivity disorder (ADHD), unspecified ADHD type - Ambulatory referral to Internal Medicine, [facility] attention specialist - Dr. Marisue BrooklynAmy Stevenson - I have tried several adjustments to patients medication regimen, nothing is helping her symptoms. Will refer to [facility]. Rx 1 months Adderall XR 30mg . RTC in 1 month for f/u if she has not yet had appt with [facility]. She understands and agrees with plan.     Marco CollieWhitney Thomson Herbers, PA-C  [facility] at [facility] 01/09/2017 4:38 PM

## 2017-01-09 NOTE — Telephone Encounter (Signed)
Left message script will be at front desk

## 2017-01-09 NOTE — Patient Instructions (Signed)
     IF you received an x-ray today, you will receive an invoice from [facility]. Please contact [facility] at [phone] with questions or concerns regarding your invoice.   IF you received labwork today, you will receive an invoice from [facility]. Please contact [facility] at [phone] with questions or concerns regarding your invoice.   Our billing staff will not be able to assist you with questions regarding bills from these companies.  You will be contacted with the lab results as soon as they are available. The fastest way to get your results is to activate your My Chart account. Instructions are located on the last page of this paperwork. If you have not heard from us regarding the results in 2 weeks, please contact this office.     

## 2017-01-10 NOTE — Telephone Encounter (Signed)
Pt came in to see Whitney McVey 01/09/17

## 2017-01-17 ENCOUNTER — Telehealth: Payer: Self-pay

## 2017-01-17 NOTE — Telephone Encounter (Signed)
Please advise 

## 2017-01-17 NOTE — Telephone Encounter (Signed)
Pt was prescribed 30 mg adderall time release and says it is making her very drowsy and she is having trouble focusing. She would like to take 20 mg without time release to help with this. She scheduled an appointment for Tuesday 01/21/17 for this but would like to be advised earlier if possible.

## 2017-01-21 ENCOUNTER — Ambulatory Visit (INDEPENDENT_AMBULATORY_CARE_PROVIDER_SITE_OTHER): Payer: Medicare Other | Admitting: Physician Assistant

## 2017-01-21 VITALS — BP 114/70 | HR 81 | Temp 98.6°F | Resp 18 | Ht 62.0 in | Wt 180.0 lb

## 2017-01-21 DIAGNOSIS — R5382 Chronic fatigue, unspecified: Secondary | ICD-10-CM

## 2017-01-21 DIAGNOSIS — E785 Hyperlipidemia, unspecified: Secondary | ICD-10-CM | POA: Diagnosis not present

## 2017-01-21 DIAGNOSIS — Z131 Encounter for screening for diabetes mellitus: Secondary | ICD-10-CM

## 2017-01-21 DIAGNOSIS — E118 Type 2 diabetes mellitus with unspecified complications: Secondary | ICD-10-CM | POA: Diagnosis not present

## 2017-01-21 DIAGNOSIS — R42 Dizziness and giddiness: Secondary | ICD-10-CM

## 2017-01-21 DIAGNOSIS — Z79899 Other long term (current) drug therapy: Secondary | ICD-10-CM

## 2017-01-21 DIAGNOSIS — F909 Attention-deficit hyperactivity disorder, unspecified type: Secondary | ICD-10-CM

## 2017-01-21 DIAGNOSIS — M25529 Pain in unspecified elbow: Secondary | ICD-10-CM | POA: Diagnosis not present

## 2017-01-21 DIAGNOSIS — F419 Anxiety disorder, unspecified: Secondary | ICD-10-CM | POA: Diagnosis not present

## 2017-01-21 MED ORDER — AMPHETAMINE-DEXTROAMPHETAMINE 20 MG PO TABS: 20.0000 mg | ORAL_TABLET | Freq: Two times a day (BID) | ORAL | 0 refills | Status: DC

## 2017-01-21 NOTE — Progress Notes (Signed)
[name]  MRN: [id number] DOB: January 04, 1941  PCP: Gelene Mink Denali Sharma, PA-C  Subjective:  Pt is a 76 year old female who presents to clinic for medication follow-up. She has been on several dose regimens of Adderall, the most recent of which is XR 16m.  At her last OV [facility] to help her find the dose that works for her. They have not yet called.   Her eyes "freeze up and start closing" if she does not take her Adderall.   Sleepiness - reports her life changed following EBV infection in 1998 which required admision to the hospital. Dx with viral meningitis in 2005. She was evaluated by neurology in 2017. 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  She reports best relief with Adderall 24mbid. She brought with her 22 pills of Adderall 3081mhich were prescribed at her last Ov. She prefers a different dose.   Arm pain - both arms. Chronic.  Worse with raising arms. R>L.   There are multiple care gaps for this patient including Tdap, Hemoglobin A1c, PNA vac, DEXA scan, diabetic foot and eye exam.   Review of Systems  Constitutional: Positive for activity change and fatigue. Negative for chills, diaphoresis and fever.  HENT: Negative for congestion, postnasal drip, rhinorrhea, sinus pressure, sneezing and sore throat.   Respiratory: Negative for cough, chest tightness, shortness of breath and wheezing.   Cardiovascular: Negative for chest pain and palpitations.  Gastrointestinal: Negative for abdominal pain, diarrhea, nausea and vomiting.  Musculoskeletal: Positive for arthralgias and myalgias.  Neurological: Negative for weakness, light-headedness and headaches.  Psychiatric/Behavioral: Positive for decreased concentration.    Patient Active Problem List   Diagnosis Date Noted  . ADD (attention deficit disorder) 12/30/2016  . [place] ([place]  . Excessive daytime sleepiness 12/05/2014  . Alteration in psychomotor activity 12/05/2014  . Depression with somatization 12/05/2014  . Hypersomnia, persistent 09/10/2013  . Obesity, unspecified 09/10/2013  . Chronic pain syndrome 09/10/2013  . Depression 07/31/2013  . Nausea alone 07/19/2013  . Medication intolerance 07/19/2013  . Anxiety 07/19/2013  . CAD (coronary artery disease) status post stent to distal right coronary artery 2006 06/20/2013  . Neurocardiogenic syncope 06/20/2013  . Essential hypertension 06/20/2013  . Hyperlipidemia 06/20/2013    Current Outpatient Prescriptions on File Prior to Visit  Medication Sig Dispense Refill  . amphetamine-dextroamphetamine (ADDERALL XR) 30 MG 24 hr capsule Take 1 capsule (30 mg total) by mouth every morning. 30 capsule 0  . aspirin 81 MG tablet Take 81 mg by mouth daily.    . aMarland Kitchenenolol (TENORMIN) 25 MG tablet Take 1 tab by mouth in the morning and 2 tab in the evening. Please schedule appointment for refills. 270 tablet 0  . fexofenadine (ALLEGRA) 180 MG tablet Take 180 mg by mouth daily as needed.     . FMarland KitchenUoxetine (PROZAC) 20 MG capsule Take 1 capsule (20 mg total) by mouth 2 (two) times daily. <please get future refills from PCP> 60 capsule 0  . hydrochlorothiazide (HYDRODIURIL) 25 MG tablet     . HYDROcodone-acetaminophen (NORCO) 10-325 MG per tablet Take 1-2 tablets by mouth as needed.     . iMarland Kitchenuprofen (ADVIL,MOTRIN) 800 MG tablet Take 800 mg by mouth every 8 (eight) hours as needed.    . lMarland Kitchenvothyroxine (SYNTHROID, LEVOTHROID) 50 MCG tablet Take 50 mcg by mouth daily before breakfast.    .  lisinopril (PRINIVIL,ZESTRIL) 20 MG tablet Take 1 tab by mouth in the morning and 1/2 tab at bedtime. Please schedule appointment for refills. 135 tablet 0  . Misc. Devices (HUGO ROLLING WALKER ELITE) MISC 1 Units by Does not apply route daily as needed. 1 each 0  . potassium chloride (K-DUR,KLOR-CON) 10 MEQ tablet Take 1  tablet (10 mEq total) by mouth as needed (only takes if she dont eat potassium rich foods). 30 tablet 2  . amphetamine-dextroamphetamine (ADDERALL) 15 MG tablet Take 15 -30m PO daily 60 tablet 0  . diazepam (VALIUM) 5 MG tablet Take 5 mg by mouth every 12 (twelve) hours as needed.      No current facility-administered medications on file prior to visit.     Allergies  Allergen Reactions  . Cardizem [Diltiazem Hcl]     nausea  . Codeine     Feeling weird   . Coreg [Carvedilol]   . Demerol [Meperidine]     unknown  . Erythromycin     Very sick  . Inderal [Propranolol]     Made me cry  . Lisinopril Cough  . Procardia [Nifedipine]   . Wellbutrin [Bupropion]   . Zoloft [Sertraline Hcl]     Could not talk     Objective:  BP 114/70   Pulse 81   Temp 98.6 F (37 C) (Oral)   Resp 18   Ht _0  (1.575 m)   Wt 180 lb (81.6 kg)   SpO2 97%   BMI 32.92 kg/m   Physical Exam  Constitutional: She is oriented to person, place, and time and well-developed, well-nourished, and in no distress. No distress.  Cardiovascular: Normal rate, regular rhythm and normal heart sounds.   Pulmonary/Chest: Effort normal. No respiratory distress.  Neurological: She is alert and oriented to person, place, and time. GCS score is 15.  Skin: Skin is warm and dry.  Psychiatric: Mood, memory, affect and judgment normal.  Vitals reviewed.   Assessment and Plan :  1. Encounter for medication management 2. Anxiety 3. Attention deficit hyperactivity disorder (ADHD), unspecified ADHD type - amphetamine-dextroamphetamine (ADDERALL) 20 MG tablet; Take 1 tablet (20 mg total) by mouth 2 (two) times daily.  Dispense: 60 tablet; Refill: 0 - 22 Adderall 347min bottle. Dispensed of in controlled medication disposal bin. Medication bottle and prescription label disposed of.  - It looks like she has been approved [facility] - phone number given to pt, advised her to call to  set up appt.  She will come back for lab only visit to have labs drawn in preporation for her annual exam. Advised her to schedule annual exam with me next month. Will review results of her labs at that Ov. Goal is to get her feeling well enough to go to physical therapy for strengthening exercises.  4. Chronic fatigue - TSH; Future - CBC; Future - CMP14+EGFR; Future  5. Dizziness  6. Arthralgia of upper arm, unspecified laterality - Rheumatoid factor; Future  7. Screening for diabetes mellitus 8. Hyperlipidemia, unspecified hyperlipidemia type - Lipid panel; Future  9. Type 2 diabetes mellitus with complication, unspecified long term insulin use status (HCC) - Hemoglobin A1c; Future   WhMercer PodPA-C  [facility]/27/2018 4:33 PM

## 2017-01-21 NOTE — Patient Instructions (Addendum)
Your next appt should be an annual exam. Please come in for a LAB ONLY visit within the next week or so. Schedule your annual exam for next month. We will go over the results of your labs at that visit.   You have been approved for an appt with [facility]. Please call [facility] [phone] if you have not heard from them by the end of this week.    Thank you for coming in today. I hope you feel we met your needs.  Feel free to call [facility] if you have any questions or further requests.  Please consider signing up for MyChart if you do not already have it, as this is a great way to communicate with me.  Best,  Whitney McVey, PA-C  IF you received an x-ray today, you will receive an invoice from [facility]. Please contact [facility] at [phone] with questions or concerns regarding your invoice.   IF you received labwork today, you will receive an invoice from [place]. Please contact [facility] at [phone] with questions or concerns regarding your invoice.   Our billing staff will not be able to assist you with questions regarding bills from these companies.  You will be contacted with the lab results as soon as they are available. The fastest way to get your results is to activate your My Chart account. Instructions are located on the last page of this paperwork. If you have not heard from [place] regarding the results in 2 weeks, please contact this office.

## 2017-01-24 ENCOUNTER — Telehealth: Payer: Self-pay | Admitting: Physician Assistant

## 2017-01-24 NOTE — Telephone Encounter (Signed)
[name]-see the message below regarding pt referral request.  Godfrey PickKimberly S. Tiburcio PeaHarris, MSN, FNP-C [facility] [phone]

## 2017-01-24 NOTE — Telephone Encounter (Signed)
Pt called to let us know that [name] contacted her. They do not participate with her insurance. I am sending to the [facility] as they also specialize in ADHD.

## 2017-02-19 ENCOUNTER — Other Ambulatory Visit: Payer: Self-pay | Admitting: Family Medicine

## 2017-02-19 ENCOUNTER — Other Ambulatory Visit (INDEPENDENT_AMBULATORY_CARE_PROVIDER_SITE_OTHER): Payer: Medicare Other | Admitting: Physician Assistant

## 2017-02-19 DIAGNOSIS — M25529 Pain in unspecified elbow: Secondary | ICD-10-CM

## 2017-02-19 DIAGNOSIS — E785 Hyperlipidemia, unspecified: Secondary | ICD-10-CM

## 2017-02-19 DIAGNOSIS — E118 Type 2 diabetes mellitus with unspecified complications: Secondary | ICD-10-CM

## 2017-02-19 DIAGNOSIS — R5382 Chronic fatigue, unspecified: Secondary | ICD-10-CM | POA: Diagnosis not present

## 2017-02-19 NOTE — Telephone Encounter (Signed)
Pt calling for a refill on her prozac

## 2017-02-20 LAB — HEMOGLOBIN A1C
Est. average glucose Bld gHb Est-mCnc: 226 mg/dL
Hgb A1c MFr Bld: 9.5 % — ABNORMAL HIGH (ref 4.8–5.6)

## 2017-02-20 LAB — CMP14+EGFR
ALT: 22 IU/L (ref 0–32)
AST: 42 IU/L — ABNORMAL HIGH (ref 0–40)
Albumin/Globulin Ratio: 1.8 (ref 1.2–2.2)
Albumin: 4.2 g/dL (ref 3.5–4.8)
Alkaline Phosphatase: 122 IU/L — ABNORMAL HIGH (ref 39–117)
BUN/Creatinine Ratio: 12 (ref 12–28)
BUN: 11 mg/dL (ref 8–27)
Bilirubin Total: 0.8 mg/dL (ref 0.0–1.2)
CO2: 22 mmol/L (ref 18–29)
Calcium: 9.6 mg/dL (ref 8.7–10.3)
Chloride: 94 mmol/L — ABNORMAL LOW (ref 96–106)
Creatinine, Ser: 0.92 mg/dL (ref 0.57–1.00)
GFR calc Af Amer: 70 mL/min/{1.73_m2} (ref >59)
GFR calc non Af Amer: 61 mL/min/{1.73_m2} (ref >59)
Globulin, Total: 2.4 g/dL (ref 1.5–4.5)
Glucose: 186 mg/dL — ABNORMAL HIGH (ref 65–99)
Potassium: 4.3 mmol/L (ref 3.5–5.2)
Sodium: 136 mmol/L (ref 134–144)
Total Protein: 6.6 g/dL (ref 6.0–8.5)

## 2017-02-20 LAB — CBC
Hematocrit: 39.8 % (ref 34.0–46.6)
Hemoglobin: 12.7 g/dL (ref 11.1–15.9)
MCH: 27.2 pg (ref 26.6–33.0)
MCHC: 31.9 g/dL (ref 31.5–35.7)
MCV: 85 fL (ref 79–97)
Platelets: 278 10*3/uL (ref 150–379)
RBC: 4.67 x10E6/uL (ref 3.77–5.28)
RDW: 14.1 % (ref 12.3–15.4)
WBC: 9.9 10*3/uL (ref 3.4–10.8)

## 2017-02-20 LAB — RHEUMATOID FACTOR: Rheumatoid fact SerPl-aCnc: <10 [IU]/mL (ref 0.0–13.9)

## 2017-02-20 LAB — LIPID PANEL
Chol/HDL Ratio: 5.6 ratio units — ABNORMAL HIGH (ref 0.0–4.4)
Cholesterol, Total: 322 mg/dL — ABNORMAL HIGH (ref 100–199)
HDL: 58 mg/dL (ref >39)
LDL Calculated: 222 mg/dL — ABNORMAL HIGH (ref 0–99)
Triglycerides: 211 mg/dL — ABNORMAL HIGH (ref 0–149)
VLDL Cholesterol Cal: 42 mg/dL — ABNORMAL HIGH (ref 5–40)

## 2017-02-20 LAB — TSH: TSH: 2.76 u[IU]/mL (ref 0.450–4.500)

## 2017-02-24 MED ORDER — FLUOXETINE HCL 20 MG PO CAPS: 20.0000 mg | ORAL_CAPSULE | Freq: Two times a day (BID) | ORAL | 0 refills | Status: DC

## 2017-02-25 NOTE — Progress Notes (Signed)
Please call pt and schedule her annual exam this month.  Thank you!

## 2017-02-25 NOTE — Progress Notes (Signed)
Called to schedule a CPE with you and pt stated that she will need to call us back to schedule.

## 2017-02-28 ENCOUNTER — Telehealth: Payer: Self-pay | Admitting: Physician Assistant

## 2017-02-28 NOTE — Telephone Encounter (Signed)
Please refill if ok

## 2017-02-28 NOTE — Telephone Encounter (Signed)
This is a follow up on a pt call on 02/19/18.  Pt calling for a refill on her prozac.  Please advise

## 2017-03-01 ENCOUNTER — Other Ambulatory Visit: Payer: Self-pay | Admitting: Physician Assistant

## 2017-03-01 DIAGNOSIS — F32A Depression, unspecified: Secondary | ICD-10-CM

## 2017-03-01 DIAGNOSIS — F329 Major depressive disorder, single episode, unspecified: Secondary | ICD-10-CM

## 2017-03-01 MED ORDER — FLUOXETINE HCL 20 MG PO CAPS: 20.0000 mg | ORAL_CAPSULE | Freq: Two times a day (BID) | ORAL | 0 refills | Status: DC

## 2017-03-01 NOTE — Progress Notes (Signed)
OK to refill 1 month. She needs annual exam

## 2017-03-01 NOTE — Telephone Encounter (Signed)
OK to refill. She needs to schedule her annual exam soon. Please have her do this.

## 2017-03-07 ENCOUNTER — Telehealth: Payer: Self-pay | Admitting: Physician Assistant

## 2017-03-07 MED ORDER — LEVOTHYROXINE SODIUM 50 MCG PO TABS: 50.0000 ug | ORAL_TABLET | Freq: Every day | ORAL | 5 refills | Status: DC

## 2017-03-07 NOTE — Telephone Encounter (Signed)
Pt's husband would like a refill on his wife's levothyroxine (SYNTHROID, LEVOTHROID) 50 MCG tablet [[id number]]. Please advise at [phone]

## 2017-03-20 ENCOUNTER — Telehealth: Payer: Self-pay | Admitting: Physician Assistant

## 2017-03-20 NOTE — Telephone Encounter (Signed)
Pt is calling to get a refill of her Adderrall.  I don't see it on her med list though but according to the pt, Mcvey said she could call and get a refill.  Pt has fallen several times over the past few days and I advised her to come in and be seen but pt refused.  Please advise [phone]

## 2017-03-20 NOTE — Telephone Encounter (Signed)
error 

## 2017-03-21 ENCOUNTER — Other Ambulatory Visit: Payer: Self-pay | Admitting: Physician Assistant

## 2017-03-21 DIAGNOSIS — F0630 Mood disorder due to known physiological condition, unspecified: Secondary | ICD-10-CM

## 2017-03-21 MED ORDER — AMPHETAMINE-DEXTROAMPHETAMINE 20 MG PO TABS: 20.0000 mg | ORAL_TABLET | Freq: Two times a day (BID) | ORAL | 0 refills | Status: DC

## 2017-04-02 DIAGNOSIS — F331 Major depressive disorder, recurrent, moderate: Secondary | ICD-10-CM | POA: Diagnosis not present

## 2017-04-02 DIAGNOSIS — F411 Generalized anxiety disorder: Secondary | ICD-10-CM | POA: Diagnosis not present

## 2017-04-03 ENCOUNTER — Telehealth: Payer: Self-pay | Admitting: Family Medicine

## 2017-04-03 NOTE — Telephone Encounter (Signed)
SPOKE WITH PATIENT SHE STATES THAT SHE WILL CALL BACK FOR AN APPOINTMENT AND THAT SHE REALLY LIKES THE PROVIDER THAT YOU REFERRED HER TO AND THAT SHE DO NOT HAVE ADHD

## 2017-04-03 NOTE — Progress Notes (Signed)
Please call pt and have her come in for annual exam. We need to discuss medications. Thank you!

## 2017-04-16 ENCOUNTER — Ambulatory Visit (INDEPENDENT_AMBULATORY_CARE_PROVIDER_SITE_OTHER): Payer: Medicare Other | Admitting: Physician Assistant

## 2017-04-16 VITALS — BP 111/65 | HR 71 | Temp 97.5°F | Resp 18 | Ht 62.0 in

## 2017-04-16 DIAGNOSIS — I10 Essential (primary) hypertension: Secondary | ICD-10-CM

## 2017-04-16 DIAGNOSIS — F0630 Mood disorder due to known physiological condition, unspecified: Secondary | ICD-10-CM | POA: Diagnosis not present

## 2017-04-16 DIAGNOSIS — F329 Major depressive disorder, single episode, unspecified: Secondary | ICD-10-CM | POA: Diagnosis not present

## 2017-04-16 DIAGNOSIS — E118 Type 2 diabetes mellitus with unspecified complications: Secondary | ICD-10-CM

## 2017-04-16 DIAGNOSIS — E782 Mixed hyperlipidemia: Secondary | ICD-10-CM

## 2017-04-16 DIAGNOSIS — F32A Depression, unspecified: Secondary | ICD-10-CM

## 2017-04-16 MED ORDER — FLUOXETINE HCL 20 MG PO CAPS: 20.0000 mg | ORAL_CAPSULE | Freq: Two times a day (BID) | ORAL | 2 refills | Status: DC

## 2017-04-16 MED ORDER — AMPHETAMINE-DEXTROAMPHETAMINE 20 MG PO TABS: 20.0000 mg | ORAL_TABLET | Freq: Two times a day (BID) | ORAL | 0 refills | Status: DC

## 2017-04-16 MED ORDER — METFORMIN HCL 500 MG PO TABS: 500.0000 mg | ORAL_TABLET | Freq: Two times a day (BID) | ORAL | 3 refills | Status: DC

## 2017-04-16 MED ORDER — ATENOLOL 25 MG PO TABS: ORAL_TABLET | ORAL | 0 refills | Status: DC

## 2017-04-16 MED ORDER — LISINOPRIL 20 MG PO TABS: ORAL_TABLET | ORAL | 0 refills | Status: DC

## 2017-04-16 MED ORDER — HYDROCHLOROTHIAZIDE 25 MG PO TABS: 25.0000 mg | ORAL_TABLET | Freq: Every day | ORAL | 3 refills | Status: DC

## 2017-04-16 NOTE — Progress Notes (Signed)
[name]  MRN: [id number] DOB: February 18, 1941  PCP: Sebastian Ache, PA-C  Subjective:  Pt is a 76 year old female with a complex medical history who presents to clinic for follow-up lab work. Last OV 01/21/2017. She was advised to RTC for lab only visit. She is here today to discuss lab work.  Continues to c/o weakness, lightheadedness, fatigue.  Her A1C is elevated. She has never been told she has diabetes.  Lipid panel is elevated. She refuses to start statin - she experienced negative side effects in the past. Would like to start her supplements she has used in the past.   She needs refills of several medications.   She went to see psychiatrist recently. She was referred to [facility]. Ellan Lambert, MD for her h/o EBV meningitis.   Dr. Elsie Lincoln cardiologist.   Review of Systems  Constitutional: Positive for fatigue. Negative for chills, diaphoresis and fever.  Respiratory: Negative for cough, chest tightness, shortness of breath and wheezing.   Cardiovascular: Negative for chest pain and palpitations.  Gastrointestinal: Negative for abdominal pain, diarrhea, nausea and vomiting.  Neurological: Positive for dizziness, weakness and light-headedness. Negative for headaches.    Patient Active Problem List   Diagnosis Date Noted  . ADD (attention deficit disorder) 12/30/2016  . Narcotic addiction (HCC) 12/05/2014  . Excessive daytime sleepiness 12/05/2014  . Alteration in psychomotor activity 12/05/2014  . Depression with somatization 12/05/2014  . Hypersomnia, persistent 09/10/2013  . Obesity, unspecified 09/10/2013  . Chronic pain syndrome 09/10/2013  . Depression 07/31/2013  . Nausea alone 07/19/2013  . Medication intolerance 07/19/2013  . Anxiety 07/19/2013  . CAD (coronary artery disease) status post stent to distal right coronary artery 2006 06/20/2013  . Neurocardiogenic syncope 06/20/2013  . Essential hypertension 06/20/2013  . Hyperlipidemia 06/20/2013     Current Outpatient Prescriptions on File Prior to Visit  Medication Sig Dispense Refill  . amphetamine-dextroamphetamine (ADDERALL) 20 MG tablet Take 1 tablet (20 mg total) by mouth 2 (two) times daily. NEEDS OV FOR REFILL 60 tablet 0  . aspirin 81 MG tablet Take 81 mg by mouth daily.    Marland Kitchen atenolol (TENORMIN) 25 MG tablet Take 1 tab by mouth in the morning and 2 tab in the evening. Please schedule appointment for refills. 270 tablet 0  . fexofenadine (ALLEGRA) 180 MG tablet Take 180 mg by mouth daily as needed.     Marland Kitchen FLUoxetine (PROZAC) 20 MG capsule Take 1 capsule (20 mg total) by mouth 2 (two) times daily. <please get future refills from PCP> 60 capsule 0  . hydrochlorothiazide (HYDRODIURIL) 25 MG tablet     . HYDROcodone-acetaminophen (NORCO) 10-325 MG per tablet Take 1-2 tablets by mouth as needed.     Marland Kitchen ibuprofen (ADVIL,MOTRIN) 800 MG tablet Take 800 mg by mouth every 8 (eight) hours as needed.    Marland Kitchen levothyroxine (SYNTHROID, LEVOTHROID) 50 MCG tablet Take 1 tablet (50 mcg total) by mouth daily before breakfast. 30 tablet 5  . lisinopril (PRINIVIL,ZESTRIL) 20 MG tablet Take 1 tab by mouth in the morning and 1/2 tab at bedtime. Please schedule appointment for refills. 135 tablet 0  . Misc. Devices (HUGO ROLLING WALKER ELITE) MISC 1 Units by Does not apply route daily as needed. 1 each 0  . potassium chloride (K-DUR,KLOR-CON) 10 MEQ tablet Take 1 tablet (10 mEq total) by mouth as needed (only takes if she dont eat potassium rich foods). 30 tablet 2   No current facility-administered medications on file prior  to visit.     Allergies  Allergen Reactions  . Cardizem [Diltiazem Hcl]     nausea  . Codeine     Feeling weird   . Coreg [Carvedilol]   . Demerol [Meperidine]     unknown  . Erythromycin     Very sick  . Inderal [Propranolol]     Made me cry  . Lisinopril Cough  . Procardia [Nifedipine]   . Wellbutrin [Bupropion]   . Zoloft [Sertraline Hcl]     Could not talk      Objective:  There were no vitals taken for this visit.  Physical Exam  Constitutional: She is oriented to person, place, and time and well-developed, well-nourished, and in no distress. No distress.  Cardiovascular: Normal rate, regular rhythm and normal heart sounds.   Neurological: She is alert and oriented to person, place, and time. GCS score is 15.  Skin: Skin is warm and dry.  Psychiatric: Mood, memory, affect and judgment normal.  Vitals reviewed.  Lab Results  Component Value Date   HGBA1C 9.5 (H) 02/19/2017   Lab Results  Component Value Date   CHOL 322 (H) 02/19/2017   HDL 58 02/19/2017   LDLCALC 222 (H) 02/19/2017   TRIG 211 (H) 02/19/2017   CHOLHDL 5.6 (H) 02/19/2017    Assessment and Plan :  1. Type 2 diabetes mellitus with complication, without long-term current use of insulin (HCC) - metFORMIN (GLUCOPHAGE) 500 MG tablet; Take 1 tablet (500 mg total) by mouth 2 (two) times daily with a meal. Wk1: 1/2 tab 2x/day; Wk2: 1 tab am, 1/2 tab pm; Wk3: 2 tabs 2x/day.  Dispense: 180 tablet; Refill: 3 - Titration schedule printed out and discussed with pt. She is to RTC in 4 weeks for f/u, blood sugar check. She is reluctant to start another medication. Discussed with pt this may be a source of why she is feeling so poorly.   2. Mixed hyperlipidemia 3. Essential hypertension - atenolol (TENORMIN) 25 MG tablet; Take 1 tab by mouth in the morning and 2 tab in the evening. Please schedule appointment for refills.  Dispense: 270 tablet; Refill: 0 - lisinopril (PRINIVIL,ZESTRIL) 20 MG tablet; Take 1 tab by mouth in the morning and 1/2 tab at bedtime.  Dispense: 135 tablet; Refill: 0 - hydrochlorothiazide (HYDRODIURIL) 25 MG tablet; Take 1 tablet (25 mg total) by mouth daily.  Dispense: 30 tablet; Refill: 3  4. Depression, unspecified depression type - FLUoxetine (PROZAC) 20 MG capsule; Take 1 capsule (20 mg total) by mouth 2 (two) times daily.  Dispense: 60 capsule; Refill:  2  5. Mood disorder in conditions classified elsewhere - amphetamine-dextroamphetamine (ADDERALL) 20 MG tablet; Take 1 tablet (20 mg total) by mouth 2 (two) times daily.  Dispense: 60 tablet; Refill: 0   Marco CollieWhitney Getsemani Lindon, PA-C  Primary Care at [facility] 04/16/2017 3:26 PM

## 2017-04-16 NOTE — Patient Instructions (Addendum)
Your blood sugar is very high. Please start taking Metformin. See the below dosing schedule to titrate up on an appropriate dose. Come back and see me in 1 month to recheck your blood sugar levels.   Metformin Dosing (to be taken with food) Week 1: take 1/2 tablet twice a day. Week 2: take 1 tablet in the morning, 1/2 tablet at night. Week 3: take 2 tablets twice a day.   Your cholesterol levels are very high. This puts you at an increased risk for heart disease and stroke. Please start taking your fish oils and other cholesterol-lowering supplements.  I recommend you eat healthier, cut down on fatty foods, fried foods, limit red meat to once a week, eat balanced meals including a serving of vegetables and/or fruits with every meal. Come back and see me in 1 month to recheck your cholesterol levels.   Thank you for coming in today. I hope you feel we met your needs.  Feel free to call [facility] if you have any questions or further requests.  Please consider signing up for MyChart if you do not already have it, as this is a great way to communicate with me.  Best,  Whitney McVey, PA-C   Carbohydrate Counting for Diabetes Mellitus, Adult Carbohydrate counting is a method for keeping track of how many carbohydrates you eat. Eating carbohydrates naturally increases the amount of sugar (glucose) in the blood. Counting how many carbohydrates you eat helps keep your blood glucose within normal limits, which helps you manage your diabetes (diabetes mellitus). It is important to know how many carbohydrates you can safely have in each meal. This is different for every person. A diet and nutrition specialist (registered dietitian) can help you make a meal plan and calculate how many carbohydrates you should have at each meal and snack. Carbohydrates are found in the following foods:  Grains, such as breads and cereals.  Dried beans and soy products.  Starchy vegetables, such as potatoes, peas, and  corn.  Fruit and fruit juices.  Milk and yogurt.  Sweets and snack foods, such as cake, cookies, candy, chips, and soft drinks. How do I count carbohydrates? There are two ways to count carbohydrates in food. You can use either of the methods or a combination of both. Reading "Nutrition Facts" on packaged food  The "Nutrition Facts" list is included on the labels of almost all packaged foods and beverages in the [place] It includes:  The serving size.  Information about nutrients in each serving, including the grams (g) of carbohydrate per serving. To use the "Nutrition Facts":  Decide how many servings you will have.  Multiply the number of servings by the number of carbohydrates per serving.  The resulting number is the total amount of carbohydrates that you will be having. Learning standard serving sizes of other foods  When you eat foods containing carbohydrates that are not packaged or do not include "Nutrition Facts" on the label, you need to measure the servings in order to count the amount of carbohydrates:  Measure the foods that you will eat with a food scale or measuring cup, if needed.  Decide how many standard-size servings you will eat.  Multiply the number of servings by 15. Most carbohydrate-rich foods have about 15 g of carbohydrates per serving.  For example, if you eat 8 oz (170 g) of strawberries, you will have eaten 2 servings and 30 g of carbohydrates (2 servings x 15 g = 30 g).  For foods  that have more than one food mixed, such as soups and casseroles, you must count the carbohydrates in each food that is included. The following list contains standard serving sizes of common carbohydrate-rich foods. Each of these servings has about 15 g of carbohydrates:   hamburger bun or  English muffin.   oz (15 mL) syrup.   oz (14 g) jelly.  1 slice of bread.  1 six-inch tortilla.  3 oz (85 g) cooked rice or pasta.  4 oz (113 g) cooked dried beans.  4 oz  (113 g) starchy vegetable, such as peas, corn, or potatoes.  4 oz (113 g) hot cereal.  4 oz (113 g) mashed potatoes or  of a large baked potato.  4 oz (113 g) canned or frozen fruit.  4 oz (120 mL) fruit juice.  4-6 crackers.  6 chicken nuggets.  6 oz (170 g) unsweetened dry cereal.  6 oz (170 g) plain fat-free yogurt or yogurt sweetened with artificial sweeteners.  8 oz (240 mL) milk.  8 oz (170 g) fresh fruit or one small piece of fruit.  24 oz (680 g) popped popcorn. Example of carbohydrate counting Sample meal   3 oz (85 g) chicken breast.  6 oz (170 g) brown rice.  4 oz (113 g) corn.  8 oz (240 mL) milk.  8 oz (170 g) strawberries with sugar-free whipped topping. Carbohydrate calculation  1. Identify the foods that contain carbohydrates:  Rice.  Corn.  Milk.  Strawberries. 2. Calculate how many servings you have of each food:  2 servings rice.  1 serving corn.  1 serving milk.  1 serving strawberries. 3. Multiply each number of servings by 15 g:  2 servings rice x 15 g = 30 g.  1 serving corn x 15 g = 15 g.  1 serving milk x 15 g = 15 g.  1 serving strawberries x 15 g = 15 g. 4. Add together all of the amounts to find the total grams of carbohydrates eaten:  30 g + 15 g + 15 g + 15 g = 75 g of carbohydrates total. This information is not intended to replace advice given to you by your health care provider. Make sure you discuss any questions you have with your health care provider. Document Released: 11/11/2005 Document Revised: 05/31/2016 Document Reviewed: 04/24/2016 Elsevier Interactive Patient Education  2017 [employer].  IF you received an x-ray today, you will receive an invoice from [facility]. Please contact [facility] at [phone] with questions or concerns regarding your invoice.   IF you received labwork today, you will receive an invoice from [facility]. Please contact [facility] at [phone] with  questions or concerns regarding your invoice.   Our billing staff will not be able to assist you with questions regarding bills from these companies.  You will be contacted with the lab results as soon as they are available. The fastest way to get your results is to activate your My Chart account. Instructions are located on the last page of this paperwork. If you have not heard from [place] regarding the results in 2 weeks, please contact this office.

## 2017-04-24 DIAGNOSIS — Z794 Long term (current) use of insulin: Secondary | ICD-10-CM

## 2017-04-24 DIAGNOSIS — E119 Type 2 diabetes mellitus without complications: Secondary | ICD-10-CM | POA: Insufficient documentation

## 2017-04-29 DIAGNOSIS — R5382 Chronic fatigue, unspecified: Secondary | ICD-10-CM | POA: Diagnosis not present

## 2017-04-30 ENCOUNTER — Other Ambulatory Visit: Payer: Self-pay | Admitting: Neurology

## 2017-04-30 DIAGNOSIS — R5382 Chronic fatigue, unspecified: Secondary | ICD-10-CM

## 2017-04-30 DIAGNOSIS — G9332 Myalgic encephalomyelitis/chronic fatigue syndrome: Secondary | ICD-10-CM

## 2017-05-06 ENCOUNTER — Other Ambulatory Visit: Payer: Medicare Other

## 2017-05-08 ENCOUNTER — Other Ambulatory Visit: Payer: Medicare Other

## 2017-05-13 ENCOUNTER — Ambulatory Visit
Admission: RE | Admit: 2017-05-13 | Discharge: 2017-05-13 | Disposition: A | Discharge status: Home / Self Care | Payer: Medicare Other | Source: Ambulatory Visit | Attending: Neurology | Admitting: Neurology

## 2017-05-13 ENCOUNTER — Other Ambulatory Visit: Payer: Self-pay | Admitting: Physician Assistant

## 2017-05-13 DIAGNOSIS — R5382 Chronic fatigue, unspecified: Secondary | ICD-10-CM

## 2017-05-13 DIAGNOSIS — G9332 Myalgic encephalomyelitis/chronic fatigue syndrome: Secondary | ICD-10-CM

## 2017-05-13 DIAGNOSIS — S0990XA Unspecified injury of head, initial encounter: Secondary | ICD-10-CM | POA: Diagnosis not present

## 2017-05-13 DIAGNOSIS — Z79899 Other long term (current) drug therapy: Secondary | ICD-10-CM

## 2017-05-15 DIAGNOSIS — R5382 Chronic fatigue, unspecified: Secondary | ICD-10-CM | POA: Diagnosis not present

## 2017-05-20 DIAGNOSIS — M25561 Pain in right knee: Secondary | ICD-10-CM | POA: Diagnosis not present

## 2017-05-20 DIAGNOSIS — M25562 Pain in left knee: Secondary | ICD-10-CM | POA: Diagnosis not present

## 2017-05-20 DIAGNOSIS — M170 Bilateral primary osteoarthritis of knee: Secondary | ICD-10-CM | POA: Diagnosis not present

## 2017-05-29 DIAGNOSIS — M25561 Pain in right knee: Secondary | ICD-10-CM | POA: Diagnosis not present

## 2017-05-29 DIAGNOSIS — M25562 Pain in left knee: Secondary | ICD-10-CM | POA: Diagnosis not present

## 2017-05-29 DIAGNOSIS — M170 Bilateral primary osteoarthritis of knee: Secondary | ICD-10-CM | POA: Diagnosis not present

## 2017-06-10 DIAGNOSIS — M25511 Pain in right shoulder: Secondary | ICD-10-CM | POA: Diagnosis not present

## 2017-06-10 DIAGNOSIS — M25512 Pain in left shoulder: Secondary | ICD-10-CM | POA: Diagnosis not present

## 2017-06-12 DIAGNOSIS — M25511 Pain in right shoulder: Secondary | ICD-10-CM | POA: Diagnosis not present

## 2017-06-12 DIAGNOSIS — M25512 Pain in left shoulder: Secondary | ICD-10-CM | POA: Diagnosis not present

## 2017-06-28 ENCOUNTER — Other Ambulatory Visit: Payer: Self-pay | Admitting: Physician Assistant

## 2017-06-28 DIAGNOSIS — Z79899 Other long term (current) drug therapy: Secondary | ICD-10-CM

## 2017-07-01 DIAGNOSIS — M25512 Pain in left shoulder: Secondary | ICD-10-CM | POA: Diagnosis not present

## 2017-07-01 DIAGNOSIS — M25511 Pain in right shoulder: Secondary | ICD-10-CM | POA: Diagnosis not present

## 2017-07-02 DIAGNOSIS — M542 Cervicalgia: Secondary | ICD-10-CM | POA: Diagnosis not present

## 2017-07-02 DIAGNOSIS — G894 Chronic pain syndrome: Secondary | ICD-10-CM | POA: Diagnosis not present

## 2017-07-02 DIAGNOSIS — M961 Postlaminectomy syndrome, not elsewhere classified: Secondary | ICD-10-CM | POA: Diagnosis not present

## 2017-07-22 DIAGNOSIS — R5382 Chronic fatigue, unspecified: Secondary | ICD-10-CM | POA: Diagnosis not present

## 2017-07-29 ENCOUNTER — Other Ambulatory Visit: Payer: Self-pay | Admitting: Physician Assistant

## 2017-07-29 DIAGNOSIS — I10 Essential (primary) hypertension: Secondary | ICD-10-CM

## 2017-07-29 DIAGNOSIS — F32A Depression, unspecified: Secondary | ICD-10-CM

## 2017-07-29 DIAGNOSIS — Z79899 Other long term (current) drug therapy: Secondary | ICD-10-CM

## 2017-07-29 DIAGNOSIS — F329 Major depressive disorder, single episode, unspecified: Secondary | ICD-10-CM

## 2017-07-30 DIAGNOSIS — I10 Essential (primary) hypertension: Secondary | ICD-10-CM | POA: Diagnosis not present

## 2017-07-30 DIAGNOSIS — E039 Hypothyroidism, unspecified: Secondary | ICD-10-CM | POA: Diagnosis not present

## 2017-07-30 DIAGNOSIS — R5382 Chronic fatigue, unspecified: Secondary | ICD-10-CM | POA: Diagnosis not present

## 2017-08-02 NOTE — Telephone Encounter (Signed)
Please advise on medication refill Prozac. Other meds refilled

## 2017-08-06 NOTE — Telephone Encounter (Signed)
Pt needs OV for refill. Please schedule an appt with me.  Thank you!

## 2017-08-19 ENCOUNTER — Telehealth: Payer: Self-pay | Admitting: Neurology

## 2017-08-19 NOTE — Telephone Encounter (Signed)
Our office has a new referral for pt from Dr. Nicholos Johns for this patient for chronic fatigue,weakness,myalgia. When I call patient to schedule apt I explained to patient that she was already established with Dr. Vickey Huger and we needed to schedule her back with Dr. Vickey Huger. Patient stated that Dr. Vickey Huger told her last time she was here that we do not prescribe adderall any longer  So no patient is requesting apt with Dr. Frances Furbish. Is it ok for pt to switch to Dr. Frances Furbish?

## 2017-08-19 NOTE — Telephone Encounter (Signed)
I reviewed records. I do not have to add anything at this time. I decline the switch in provider.

## 2017-09-03 DIAGNOSIS — M25562 Pain in left knee: Secondary | ICD-10-CM | POA: Diagnosis not present

## 2017-09-03 DIAGNOSIS — M25561 Pain in right knee: Secondary | ICD-10-CM | POA: Diagnosis not present

## 2017-09-03 DIAGNOSIS — M170 Bilateral primary osteoarthritis of knee: Secondary | ICD-10-CM | POA: Diagnosis not present

## 2017-09-10 DIAGNOSIS — M170 Bilateral primary osteoarthritis of knee: Secondary | ICD-10-CM | POA: Diagnosis not present

## 2017-09-10 DIAGNOSIS — M25562 Pain in left knee: Secondary | ICD-10-CM | POA: Diagnosis not present

## 2017-09-10 DIAGNOSIS — M25561 Pain in right knee: Secondary | ICD-10-CM | POA: Diagnosis not present

## 2017-09-18 DIAGNOSIS — M170 Bilateral primary osteoarthritis of knee: Secondary | ICD-10-CM | POA: Diagnosis not present

## 2017-09-18 DIAGNOSIS — M25562 Pain in left knee: Secondary | ICD-10-CM | POA: Diagnosis not present

## 2017-09-18 DIAGNOSIS — M25561 Pain in right knee: Secondary | ICD-10-CM | POA: Diagnosis not present

## 2017-09-23 ENCOUNTER — Ambulatory Visit (INDEPENDENT_AMBULATORY_CARE_PROVIDER_SITE_OTHER): Payer: Medicare Other | Admitting: Physician Assistant

## 2017-09-23 ENCOUNTER — Encounter: Payer: Self-pay | Admitting: Physician Assistant

## 2017-09-23 VITALS — BP 122/82 | HR 76 | Temp 98.9°F | Resp 16 | Ht 62.99 in | Wt 167.0 lb

## 2017-09-23 DIAGNOSIS — Z23 Encounter for immunization: Secondary | ICD-10-CM | POA: Diagnosis not present

## 2017-09-23 DIAGNOSIS — G894 Chronic pain syndrome: Secondary | ICD-10-CM

## 2017-09-23 DIAGNOSIS — F0630 Mood disorder due to known physiological condition, unspecified: Secondary | ICD-10-CM | POA: Diagnosis not present

## 2017-09-23 DIAGNOSIS — Z9181 History of falling: Secondary | ICD-10-CM

## 2017-09-23 DIAGNOSIS — I10 Essential (primary) hypertension: Secondary | ICD-10-CM

## 2017-09-23 DIAGNOSIS — E118 Type 2 diabetes mellitus with unspecified complications: Secondary | ICD-10-CM | POA: Diagnosis not present

## 2017-09-23 MED ORDER — BATHTUB SAFETY RAIL MISC: 1.0000 | Freq: Once | 0 refills | Status: AC

## 2017-09-23 MED ORDER — AMPHETAMINE-DEXTROAMPHETAMINE 20 MG PO TABS: 20.0000 mg | ORAL_TABLET | Freq: Two times a day (BID) | ORAL | 0 refills | Status: DC

## 2017-09-23 NOTE — Progress Notes (Signed)
[name]  MRN: [id number] DOB: 07/01/1941  PCP: Dorise Hiss, PA-C  Subjective:  Pt is a 76 year old female PHM HTN, anxiety, angioplasty and stenting, chronic pain syndrome with a complex medical history who presents to clinic for HTN and DM. Last OV 5 months ago.   DM - prior to a few months ago, no one has ever told her she has DM. She was asked to RTC following OV 7 months ago with blood glucose of 186 and A1C 9.5. She was started on Metformin 500 mg bid on 03/2017 and asked to f/u in 4 weeks for recheck. This is her first OV back. She is taking her Metformin "most days" - sometimes she forgets and her husband is not helping her "like he should be". She does not feel that her husband is feeding her DM friendly foods. She has a difficult time caring for herself due to chronic pain syndrome. She is feeling mostly well today. Denies polydipsia, polyuria, polyphagia, nausea, diarrhea, abdominal pain.    Her husband is not feeding her on time bc he is reading magazines.  He is not feeding her a DM appropriate diet.  She feels that her husband is bathing too roughly, as she is very sensative.   Of note, she recently confronted her husband about wrong-doing in the past. He did not apologize, however she feels "lighter" like she let go of something.   She has f/u appt with Dr. Pete Glatter. [place] for her h/o EBV meningitis.  Dr. Melvern Banker cardiologist.   Review of Systems  Constitutional: Negative for chills, diaphoresis, fatigue and fever.  Respiratory: Negative for cough, chest tightness, shortness of breath and wheezing.   Cardiovascular: Negative for chest pain and palpitations.  Gastrointestinal: Negative for abdominal pain, diarrhea, nausea and vomiting.  Endocrine: Negative for polydipsia, polyphagia and polyuria.  Musculoskeletal: Positive for arthralgias and myalgias.  Neurological: Negative for weakness, light-headedness and headaches.  Psychiatric/Behavioral:  Positive for decreased concentration.    Patient Active Problem List   Diagnosis Date Noted  . Type 2 diabetes mellitus with complication, without long-term current use of insulin ([place]) 04/24/2017  . ADD (attention deficit disorder) 12/30/2016  . Narcotic addiction ([place]) 12/05/2014  . Excessive daytime sleepiness 12/05/2014  . Alteration in psychomotor activity 12/05/2014  . Depression with somatization 12/05/2014  . Hypersomnia, persistent 09/10/2013  . Obesity, unspecified 09/10/2013  . Chronic pain syndrome 09/10/2013  . Depression 07/31/2013  . Nausea alone 07/19/2013  . Medication intolerance 07/19/2013  . Anxiety 07/19/2013  . CAD (coronary artery disease) status post stent to distal right coronary artery 2006 06/20/2013  . Neurocardiogenic syncope 06/20/2013  . Essential hypertension 06/20/2013  . Hyperlipidemia 06/20/2013    Current Outpatient Prescriptions on File Prior to Visit  Medication Sig Dispense Refill  . aspirin 81 MG tablet Take 81 mg by mouth daily.    Marland Kitchen atenolol (TENORMIN) 25 MG tablet Take 1 tab by mouth in the morning and 2 tab in the evening. Please schedule appointment for refills. 270 tablet 0  . fexofenadine (ALLEGRA) 180 MG tablet Take 180 mg by mouth daily as needed.     . hydrochlorothiazide (HYDRODIURIL) 25 MG tablet Take 1 tablet (25 mg total) by mouth daily. 30 tablet 3  . HYDROcodone-acetaminophen (NORCO) 10-325 MG per tablet Take 1-2 tablets by mouth as needed.     Marland Kitchen ibuprofen (ADVIL,MOTRIN) 800 MG tablet Take 800 mg by mouth every 8 (eight) hours as needed.    Marland Kitchen  levothyroxine (SYNTHROID, LEVOTHROID) 50 MCG tablet Take 1 tablet (50 mcg total) by mouth daily before breakfast. 30 tablet 5  . lisinopril (PRINIVIL,ZESTRIL) 20 MG tablet TAKE 1 TABLET BY MOUTH IN THE MORNING AND 1/2 (ONE-HALF) AT BEDTIME 135 tablet 0  . metFORMIN (GLUCOPHAGE) 500 MG tablet Take 1 tablet (500 mg total) by mouth 2 (two) times daily with a meal. Wk1: 1/2 tab 2x/day; Wk2: 1  tab am, 1/2 tab pm; Wk3: 2 tabs 2x/day. 180 tablet 3  . Misc. Devices (HUGO ROLLING WALKER ELITE) MISC 1 Units by Does not apply route daily as needed. 1 each 0  . potassium chloride (K-DUR,KLOR-CON) 10 MEQ tablet TAKE 1 TAB (10 MEQ TOTAL) BY MOUTH AS NEEDED (ONLY TAKES IF SHE DOES NOT EAT POTASSIUM RICH FOODS) 30 tablet 0  . amphetamine-dextroamphetamine (ADDERALL) 20 MG tablet Take 1 tablet (20 mg total) by mouth 2 (two) times daily. 60 tablet 0  . FLUoxetine (PROZAC) 20 MG capsule Take 1 capsule (20 mg total) by mouth 2 (two) times daily. 60 capsule 0   No current facility-administered medications on file prior to visit.     Allergies  Allergen Reactions  . Cardizem [Diltiazem Hcl]     nausea  . Codeine     Feeling weird   . Coreg [Carvedilol]   . Demerol [Meperidine]     unknown  . Erythromycin     Very sick  . Inderal [Propranolol]     Made me cry  . Lisinopril Cough  . Procardia [Nifedipine]   . Wellbutrin [Bupropion]   . Zoloft [Sertraline Hcl]     Could not talk     Objective:  BP 122/82   Pulse 76   Temp 98.9 F (37.2 C) (Oral)   Resp 16   Ht 5' 2.99" (1.6 m)   Wt 167 lb (75.8 kg)   SpO2 94%   BMI 29.59 kg/m   Physical Exam  Constitutional: She is oriented to person, place, and time and well-developed, well-nourished, and in no distress. No distress.  Cardiovascular: Normal rate, regular rhythm and normal heart sounds.   Musculoskeletal:       Right shoulder: She exhibits decreased range of motion. She exhibits no bony tenderness.  Difficulty with ROM upper extremities - this is not new.    Neurological: She is alert and oriented to person, place, and time. GCS score is 15.  Skin: Skin is warm and dry. No bruising noted.  No bruising  Psychiatric: Mood, memory, affect and judgment normal.  Vitals reviewed.   Assessment and Plan :  1. Essential hypertension - Lipid panel - She is not fasting today.   2. Type 2 diabetes mellitus with complication,  without long-term current use of insulin (HCC) - Hemoglobin A1c - CMP14+EGFR - Ambulatory referral to Nutrition and Diabetic Education - Ambulatory referral to [facility] - A1C last OV 9.5 and started on Metformin 500 mg bid 5 months ago. Denies side effects. Pt is not taking medications consistently. She is not able to cook for herself and she believes her husband is not providing DM friendly food in the house, even though she discussed this with him. Home care eval needed. Plan to refer to DM nutrition. Her husband should accompany her. Labs are pending. Will contact with results and plan.  3. Need for prophylactic vaccination and inoculation against influenza - Flu Vaccine QUAD 36+ mos IM  5. History of falling - Ambulatory referral to [facility] - Ambulatory referral to Physical  Therapy - Misc. Devices (BATHTUB SAFETY RAIL) MISC; 1 Piece by Does not apply route once.  Dispense: 2 each; Refill: 0 - Pt is ready to start PT. This was discussed several months ago as she has difficult time maneuvering and performing ADLs.  6. Mood disorder in conditions classified elsewhere - amphetamine-dextroamphetamine (ADDERALL) 20 MG tablet; Take 1 tablet (20 mg total) by mouth 2 (two) times daily.  Dispense: 60 tablet; Refill: 0 - She has been cutting back on prozac - plans to start one every other day. Plan to try Effexor in the future. Encouraged pt to con't discussions with husband.   7. Chronic pain syndrome  Mercer Pod, PA-C  [facility] 09/23/2017 5:34 PM

## 2017-09-23 NOTE — Patient Instructions (Addendum)
  Continue to cut back on your Prozac. Come back and see me in 4 weeks. We will start Effexor.  See the list of referrals on other page - you will receive phone calls to set up these appointments.   Thank you for coming in today. I hope you feel we met your needs.  Feel free to call PCP if you have any questions or further requests.  Please consider signing up for MyChart if you do not already have it, as this is a great way to communicate with me.  Best,  Whitney McVey, PA-C   IF you received an x-ray today, you will receive an invoice from [facility]. Please contact [facility] at [phone] with questions or concerns regarding your invoice.   IF you received labwork today, you will receive an invoice from [facility]. Please contact [facility] at [phone] with questions or concerns regarding your invoice.   Our billing staff will not be able to assist you with questions regarding bills from these companies.  You will be contacted with the lab results as soon as they are available. The fastest way to get your results is to activate your My Chart account. Instructions are located on the last page of this paperwork. If you have not heard from [place] regarding the results in 2 weeks, please contact this office.

## 2017-09-24 ENCOUNTER — Other Ambulatory Visit: Payer: Self-pay | Admitting: Physician Assistant

## 2017-09-24 ENCOUNTER — Encounter: Payer: Self-pay | Admitting: Physician Assistant

## 2017-09-24 ENCOUNTER — Encounter (HOSPITAL_COMMUNITY): Payer: Self-pay | Admitting: *Deleted

## 2017-09-24 ENCOUNTER — Emergency Department (HOSPITAL_COMMUNITY)
Admission: EM | Admit: 2017-09-24 | Discharge: 2017-09-24 | Discharge status: Against Medical Advice | Payer: Medicare Other | Source: Home / Self Care

## 2017-09-24 ENCOUNTER — Telehealth: Payer: Self-pay | Admitting: Physician Assistant

## 2017-09-24 DIAGNOSIS — E1122 Type 2 diabetes mellitus with diabetic chronic kidney disease: Secondary | ICD-10-CM | POA: Diagnosis not present

## 2017-09-24 DIAGNOSIS — E871 Hypo-osmolality and hyponatremia: Secondary | ICD-10-CM | POA: Diagnosis not present

## 2017-09-24 DIAGNOSIS — Z5321 Procedure and treatment not carried out due to patient leaving prior to being seen by health care provider: Secondary | ICD-10-CM | POA: Insufficient documentation

## 2017-09-24 DIAGNOSIS — E1165 Type 2 diabetes mellitus with hyperglycemia: Secondary | ICD-10-CM | POA: Diagnosis not present

## 2017-09-24 DIAGNOSIS — T383X6A Underdosing of insulin and oral hypoglycemic [antidiabetic] drugs, initial encounter: Secondary | ICD-10-CM | POA: Diagnosis not present

## 2017-09-24 DIAGNOSIS — R7309 Other abnormal glucose: Secondary | ICD-10-CM

## 2017-09-24 DIAGNOSIS — R296 Repeated falls: Secondary | ICD-10-CM | POA: Diagnosis not present

## 2017-09-24 DIAGNOSIS — E860 Dehydration: Secondary | ICD-10-CM | POA: Diagnosis not present

## 2017-09-24 DIAGNOSIS — Z7984 Long term (current) use of oral hypoglycemic drugs: Secondary | ICD-10-CM | POA: Diagnosis not present

## 2017-09-24 LAB — CMP14+EGFR
ALT: 21 IU/L (ref 0–32)
AST: 17 IU/L (ref 0–40)
Albumin/Globulin Ratio: 1.5 (ref 1.2–2.2)
Albumin: 4 g/dL (ref 3.5–4.8)
Alkaline Phosphatase: 119 IU/L — ABNORMAL HIGH (ref 39–117)
BUN/Creatinine Ratio: 19 (ref 12–28)
BUN: 22 mg/dL (ref 8–27)
Bilirubin Total: 0.3 mg/dL (ref 0.0–1.2)
CO2: 23 mmol/L (ref 20–29)
Calcium: 9.7 mg/dL (ref 8.7–10.3)
Chloride: 87 mmol/L — ABNORMAL LOW (ref 96–106)
Creatinine, Ser: 1.16 mg/dL — ABNORMAL HIGH (ref 0.57–1.00)
GFR calc Af Amer: 52 mL/min/{1.73_m2} — ABNORMAL LOW (ref >59)
GFR calc non Af Amer: 46 mL/min/{1.73_m2} — ABNORMAL LOW (ref >59)
Globulin, Total: 2.7 g/dL (ref 1.5–4.5)
Glucose: 551 mg/dL (ref 65–99)
Potassium: 4.3 mmol/L (ref 3.5–5.2)
Sodium: 130 mmol/L — ABNORMAL LOW (ref 134–144)
Total Protein: 6.7 g/dL (ref 6.0–8.5)

## 2017-09-24 LAB — LIPID PANEL
Chol/HDL Ratio: 6.2 ratio — ABNORMAL HIGH (ref 0.0–4.4)
Cholesterol, Total: 346 mg/dL — ABNORMAL HIGH (ref 100–199)
HDL: 56 mg/dL (ref >39)
Triglycerides: 408 mg/dL — ABNORMAL HIGH (ref 0–149)

## 2017-09-24 LAB — CBG MONITORING, ED: Glucose-Capillary: 334 mg/dL — ABNORMAL HIGH (ref 65–99)

## 2017-09-24 LAB — HEMOGLOBIN A1C
Est. average glucose Bld gHb Est-mCnc: 355 mg/dL
Hgb A1c MFr Bld: 14 % — ABNORMAL HIGH (ref 4.8–5.6)

## 2017-09-24 NOTE — Progress Notes (Signed)
Please call pt and have her schedule a follow-up appointment with me as soon as she can (this week, hopefully. If not next week is alright). Thank you.

## 2017-09-24 NOTE — ED Triage Notes (Signed)
Pt was told to come to ED d/t CBG greater than 550 yesterday. Pt states she has not been taking her DM medications.

## 2017-09-24 NOTE — Progress Notes (Unsigned)
Spoke with pt on phone to discuss results of glucose. She is not taking Metformin consistently due to poor memory, her husband is not helping her with medications and he is not cooking DM friendly meals. Advised her to proceed to the ED.

## 2017-09-24 NOTE — Telephone Encounter (Signed)
Pt is needing to talk [name] again about possibly admitting her to the hospital she states she will be heading over to [place] before 3 to help get her sugar down  She states she has taken her meds and eaten some chicken broth but is very tired and has very low energy  Best number  [phone]

## 2017-09-24 NOTE — Telephone Encounter (Signed)
Discussed with Whitney.  IC pt - advised Whitney does not admit, hospitalists will attend pt in the hospital.   Whitney asked me to call [facility] ED to advise - pt coming in with sugars in excess of 550- spoke with charge RN's Secretary.

## 2017-09-24 NOTE — Progress Notes (Signed)
Spoke with pt on phone to discuss results of glucose. She is not taking Metformin consistently due to poor memory, her husband is not helping her with medications and he is not cooking DM friendly meals. She endorses a bad headache and feeling "groggy" this morning. Advised her to proceed to the ED. F/u with me in the next few days.

## 2017-09-24 NOTE — Telephone Encounter (Signed)
Forwarded message to [employer]west CommunicationsMcVey

## 2017-09-25 ENCOUNTER — Inpatient Hospital Stay (HOSPITAL_COMMUNITY)
Admission: EM | Admit: 2017-09-25 | Discharge: 2017-09-27 | DRG: 638 | Disposition: A | Discharge status: Home Health | Payer: Medicare Other | Attending: Family Medicine | Admitting: Family Medicine

## 2017-09-25 ENCOUNTER — Encounter (HOSPITAL_COMMUNITY): Payer: Self-pay

## 2017-09-25 ENCOUNTER — Observation Stay (HOSPITAL_COMMUNITY): Payer: Medicare Other

## 2017-09-25 DIAGNOSIS — Y92009 Unspecified place in unspecified non-institutional (private) residence as the place of occurrence of the external cause: Secondary | ICD-10-CM

## 2017-09-25 DIAGNOSIS — R404 Transient alteration of awareness: Secondary | ICD-10-CM | POA: Diagnosis not present

## 2017-09-25 DIAGNOSIS — Z905 Acquired absence of kidney: Secondary | ICD-10-CM

## 2017-09-25 DIAGNOSIS — Z794 Long term (current) use of insulin: Secondary | ICD-10-CM | POA: Diagnosis not present

## 2017-09-25 DIAGNOSIS — E119 Type 2 diabetes mellitus without complications: Secondary | ICD-10-CM

## 2017-09-25 DIAGNOSIS — E118 Type 2 diabetes mellitus with unspecified complications: Secondary | ICD-10-CM

## 2017-09-25 DIAGNOSIS — R739 Hyperglycemia, unspecified: Secondary | ICD-10-CM | POA: Diagnosis not present

## 2017-09-25 DIAGNOSIS — R531 Weakness: Secondary | ICD-10-CM | POA: Diagnosis not present

## 2017-09-25 DIAGNOSIS — E876 Hypokalemia: Secondary | ICD-10-CM

## 2017-09-25 DIAGNOSIS — E871 Hypo-osmolality and hyponatremia: Secondary | ICD-10-CM | POA: Diagnosis present

## 2017-09-25 DIAGNOSIS — N183 Chronic kidney disease, stage 3 (moderate): Secondary | ICD-10-CM | POA: Diagnosis not present

## 2017-09-25 DIAGNOSIS — Z7982 Long term (current) use of aspirin: Secondary | ICD-10-CM

## 2017-09-25 DIAGNOSIS — Z955 Presence of coronary angioplasty implant and graft: Secondary | ICD-10-CM

## 2017-09-25 DIAGNOSIS — Z7984 Long term (current) use of oral hypoglycemic drugs: Secondary | ICD-10-CM

## 2017-09-25 DIAGNOSIS — Z833 Family history of diabetes mellitus: Secondary | ICD-10-CM

## 2017-09-25 DIAGNOSIS — I2510 Atherosclerotic heart disease of native coronary artery without angina pectoris: Secondary | ICD-10-CM | POA: Diagnosis present

## 2017-09-25 DIAGNOSIS — T383X6A Underdosing of insulin and oral hypoglycemic [antidiabetic] drugs, initial encounter: Secondary | ICD-10-CM | POA: Diagnosis present

## 2017-09-25 DIAGNOSIS — E1165 Type 2 diabetes mellitus with hyperglycemia: Secondary | ICD-10-CM | POA: Diagnosis not present

## 2017-09-25 DIAGNOSIS — E860 Dehydration: Secondary | ICD-10-CM | POA: Diagnosis present

## 2017-09-25 DIAGNOSIS — Z79899 Other long term (current) drug therapy: Secondary | ICD-10-CM

## 2017-09-25 DIAGNOSIS — R410 Disorientation, unspecified: Secondary | ICD-10-CM | POA: Diagnosis not present

## 2017-09-25 DIAGNOSIS — I129 Hypertensive chronic kidney disease with stage 1 through stage 4 chronic kidney disease, or unspecified chronic kidney disease: Secondary | ICD-10-CM | POA: Diagnosis present

## 2017-09-25 DIAGNOSIS — F39 Unspecified mood [affective] disorder: Secondary | ICD-10-CM | POA: Diagnosis present

## 2017-09-25 DIAGNOSIS — Z8249 Family history of ischemic heart disease and other diseases of the circulatory system: Secondary | ICD-10-CM

## 2017-09-25 DIAGNOSIS — R413 Other amnesia: Secondary | ICD-10-CM | POA: Diagnosis not present

## 2017-09-25 DIAGNOSIS — E1122 Type 2 diabetes mellitus with diabetic chronic kidney disease: Secondary | ICD-10-CM | POA: Diagnosis present

## 2017-09-25 DIAGNOSIS — R296 Repeated falls: Secondary | ICD-10-CM | POA: Diagnosis present

## 2017-09-25 DIAGNOSIS — Z91128 Patient's intentional underdosing of medication regimen for other reason: Secondary | ICD-10-CM

## 2017-09-25 LAB — URINALYSIS, ROUTINE W REFLEX MICROSCOPIC
BILIRUBIN URINE: NEGATIVE
Glucose, UA: >=500 mg/dL — AB
KETONES UR: 5 mg/dL — AB
Nitrite: NEGATIVE
Protein, ur: NEGATIVE mg/dL
SPECIFIC GRAVITY, URINE: 1.013 (ref 1.005–1.030)
pH: 5 (ref 5.0–8.0)

## 2017-09-25 LAB — GLUCOSE, CAPILLARY
GLUCOSE-CAPILLARY: 302 mg/dL — AB (ref 65–99)
Glucose-Capillary: 355 mg/dL — ABNORMAL HIGH (ref 65–99)

## 2017-09-25 LAB — CBG MONITORING, ED
GLUCOSE-CAPILLARY: 411 mg/dL — AB (ref 65–99)
GLUCOSE-CAPILLARY: 402 mg/dL — AB (ref 65–99)

## 2017-09-25 LAB — BASIC METABOLIC PANEL
Anion gap: 15 (ref 5–15)
BUN: 30 mg/dL — AB (ref 6–20)
CHLORIDE: 91 mmol/L — AB (ref 101–111)
CO2: 24 mmol/L (ref 22–32)
Calcium: 9.3 mg/dL (ref 8.9–10.3)
Creatinine, Ser: 1.08 mg/dL — ABNORMAL HIGH (ref 0.44–1.00)
GFR calc Af Amer: 56 mL/min — ABNORMAL LOW (ref >60)
GFR calc non Af Amer: 48 mL/min — ABNORMAL LOW (ref >60)
Glucose, Bld: 442 mg/dL — ABNORMAL HIGH (ref 65–99)
POTASSIUM: 3.4 mmol/L — AB (ref 3.5–5.1)
SODIUM: 130 mmol/L — AB (ref 135–145)

## 2017-09-25 LAB — CBC
HEMATOCRIT: 37.9 % (ref 36.0–46.0)
Hemoglobin: 12.9 g/dL (ref 12.0–15.0)
MCH: 28.1 pg (ref 26.0–34.0)
MCHC: 34 g/dL (ref 30.0–36.0)
MCV: 82.6 fL (ref 78.0–100.0)
Platelets: 245 10*3/uL (ref 150–400)
RBC: 4.59 MIL/uL (ref 3.87–5.11)
RDW: 12.9 % (ref 11.5–15.5)
WBC: 4.5 10*3/uL (ref 4.0–10.5)

## 2017-09-25 MED ORDER — POTASSIUM CHLORIDE CRYS ER 20 MEQ PO TBCR
40.0000 meq | EXTENDED_RELEASE_TABLET | Freq: Once | ORAL | Status: AC
  Administered 2017-09-25: 40 meq via ORAL
  Filled 2017-09-25: qty 2

## 2017-09-25 MED ORDER — ACETAMINOPHEN 325 MG PO TABS
650.0000 mg | ORAL_TABLET | Freq: Four times a day (QID) | ORAL | Status: DC | PRN
  Administered 2017-09-26: 650 mg via ORAL
  Filled 2017-09-25: qty 2

## 2017-09-25 MED ORDER — AMPHETAMINE-DEXTROAMPHETAMINE 20 MG PO TABS
20.0000 mg | ORAL_TABLET | Freq: Two times a day (BID) | ORAL | Status: DC
  Administered 2017-09-26: 20 mg via ORAL
  Filled 2017-09-25 (×2): qty 1

## 2017-09-25 MED ORDER — LORATADINE 10 MG PO TABS
10.0000 mg | ORAL_TABLET | Freq: Every day | ORAL | Status: DC
  Administered 2017-09-25 – 2017-09-27 (×3): 10 mg via ORAL
  Filled 2017-09-25 (×3): qty 1

## 2017-09-25 MED ORDER — INSULIN ASPART 100 UNIT/ML ~~LOC~~ SOLN
0.0000 [IU] | Freq: Three times a day (TID) | SUBCUTANEOUS | Status: DC
  Administered 2017-09-25: 15 [IU] via SUBCUTANEOUS
  Administered 2017-09-26: 8 [IU] via SUBCUTANEOUS
  Administered 2017-09-26: 5 [IU] via SUBCUTANEOUS
  Administered 2017-09-26: 8 [IU] via SUBCUTANEOUS
  Administered 2017-09-27 (×2): 5 [IU] via SUBCUTANEOUS

## 2017-09-25 MED ORDER — ISOSORBIDE MONONITRATE ER 30 MG PO TB24
15.0000 mg | ORAL_TABLET | Freq: Every day | ORAL | Status: DC
  Administered 2017-09-26: 15 mg via ORAL
  Filled 2017-09-25 (×2): qty 1

## 2017-09-25 MED ORDER — SODIUM CHLORIDE 0.9 % IV BOLUS (SEPSIS)
500.0000 mL | Freq: Once | INTRAVENOUS | Status: AC
  Administered 2017-09-25: 500 mL via INTRAVENOUS

## 2017-09-25 MED ORDER — LEVOTHYROXINE SODIUM 50 MCG PO TABS
50.0000 ug | ORAL_TABLET | Freq: Every day | ORAL | Status: DC
  Administered 2017-09-26 – 2017-09-27 (×2): 50 ug via ORAL
  Filled 2017-09-25 (×2): qty 1

## 2017-09-25 MED ORDER — INSULIN GLARGINE 100 UNIT/ML ~~LOC~~ SOLN
20.0000 [IU] | Freq: Every day | SUBCUTANEOUS | Status: DC
  Administered 2017-09-25: 20 [IU] via SUBCUTANEOUS
  Filled 2017-09-25 (×2): qty 0.2

## 2017-09-25 MED ORDER — SODIUM CHLORIDE 0.9 % IV BOLUS (SEPSIS)
1000.0000 mL | Freq: Once | INTRAVENOUS | Status: AC
  Administered 2017-09-25: 1000 mL via INTRAVENOUS

## 2017-09-25 MED ORDER — SODIUM CHLORIDE 0.9 % IV SOLN
INTRAVENOUS | Status: AC
  Administered 2017-09-25: 19:00:00 via INTRAVENOUS

## 2017-09-25 MED ORDER — ATORVASTATIN CALCIUM 10 MG PO TABS
20.0000 mg | ORAL_TABLET | Freq: Every day | ORAL | Status: DC
  Filled 2017-09-25: qty 2

## 2017-09-25 MED ORDER — INSULIN ASPART 100 UNIT/ML IV SOLN
4.0000 [IU] | Freq: Once | INTRAVENOUS | Status: AC
  Administered 2017-09-25: 4 [IU] via INTRAVENOUS
  Filled 2017-09-25: qty 0.04

## 2017-09-25 MED ORDER — FLUTICASONE PROPIONATE 50 MCG/ACT NA SUSP
1.0000 | Freq: Every day | NASAL | Status: DC
  Administered 2017-09-25 – 2017-09-27 (×2): 1 via NASAL
  Filled 2017-09-25: qty 16

## 2017-09-25 MED ORDER — LIVING WELL WITH DIABETES BOOK
Freq: Once | Status: AC
  Administered 2017-09-25: 18:00:00
  Filled 2017-09-25: qty 1

## 2017-09-25 MED ORDER — HYDROCODONE-ACETAMINOPHEN 5-325 MG PO TABS
1.0000 | ORAL_TABLET | Freq: Four times a day (QID) | ORAL | Status: AC | PRN
  Administered 2017-09-25 – 2017-09-26 (×2): 1 via ORAL
  Filled 2017-09-25 (×2): qty 1

## 2017-09-25 MED ORDER — HYDRALAZINE HCL 20 MG/ML IJ SOLN: 10.0000 mg | Freq: Four times a day (QID) | INTRAMUSCULAR | Status: DC | PRN

## 2017-09-25 MED ORDER — FLUOXETINE HCL 20 MG PO CAPS
20.0000 mg | ORAL_CAPSULE | Freq: Two times a day (BID) | ORAL | Status: DC
  Administered 2017-09-26 – 2017-09-27 (×3): 20 mg via ORAL
  Filled 2017-09-25 (×4): qty 1

## 2017-09-25 MED ORDER — ENOXAPARIN SODIUM 40 MG/0.4ML ~~LOC~~ SOLN
40.0000 mg | SUBCUTANEOUS | Status: DC
  Administered 2017-09-25 – 2017-09-26 (×2): 40 mg via SUBCUTANEOUS
  Filled 2017-09-25 (×2): qty 0.4

## 2017-09-25 MED ORDER — ATENOLOL 25 MG PO TABS
12.5000 mg | ORAL_TABLET | Freq: Two times a day (BID) | ORAL | Status: DC
  Administered 2017-09-25 – 2017-09-27 (×4): 12.5 mg via ORAL
  Filled 2017-09-25 (×4): qty 1

## 2017-09-25 MED ORDER — ASPIRIN EC 81 MG PO TBEC
81.0000 mg | DELAYED_RELEASE_TABLET | Freq: Every day | ORAL | Status: DC
  Administered 2017-09-25 – 2017-09-27 (×3): 81 mg via ORAL
  Filled 2017-09-25 (×3): qty 1

## 2017-09-25 NOTE — ED Triage Notes (Signed)
Per Ems: Pt from home. Pt was here yesterday. Pt left because she did not want to wait.  Pt has been going to her PCP. PCP told her to start working on her DM. Pt is non-compliant with her medication and is on steroids. Pt is asymptomatic and has fibromyalgia.  Pt's CBG was 440 Last vitals  BP 144/68 78 HR

## 2017-09-25 NOTE — ED Notes (Signed)
ED Provider at bedside. 

## 2017-09-25 NOTE — ED Provider Notes (Signed)
[facility]-EMERGENCY DEPT Provider Note   CSN: [id number] Arrival date & time: 09/25/17  1228     History   Chief Complaint Chief Complaint  Patient presents with  . Hyperglycemia  . Weakness    HPI [name] is a 76 y.o. female.  76 year old female presents with complaint of fatigue, nausea, weakness, and elevated BG. She reports that she was sent by her PMD yesterday for ED evaluation. She waited 3 hours yesterday in the ED waiting room and then went home. She then decided to come in to the ED for evaluation today (since her symptoms worsened overnight). She reports getting steroid injections last week and that this has "made my sugar go up."  She admits that she does not take her Metformin on a regular basis.       The history is provided by the patient and medical records.  Hyperglycemia  Blood sugar level PTA:  500 plus Severity:  Moderate Onset quality:  Gradual Duration:  1 week Timing:  Constant Progression:  Resolved Chronicity:  New Current diabetic treatments: not controlled, not compliant  Context: noncompliance   Context comment:  Recent steroid injection Relieved by:  None tried Ineffective treatments:  None tried Associated symptoms: dehydration, fatigue, increased thirst and nausea     Past Medical History:  Diagnosis Date  . Abdominal discomfort    "due to medication intolerance"  . CAD (coronary artery disease)    previous stent  . Chronic pain syndrome 09/10/2013   Dr Ethelene Hal,   . Excessive daytime sleepiness 12/05/2014   Patient has not been seen for a sleep study as ordered, and used adderall to keep alert in daytime.  She agreed  In a contract not to have any scheduled medication for pain treatment from [facility] and will not receive refills for Adderall, which was initiated by dr Ihor Dow, PCP>   . Fall   . Hyperlipemia   . Hypersomnia, persistent 09/10/2013   Patient on  Stimulants .  Marland Kitchen Hypertension   . Narcotic  addiction (HCC) 12/05/2014  . Obesity, unspecified 09/10/2013  . Shoulder joint pain    both shoulders    Patient Active Problem List   Diagnosis Date Noted  . Type 2 diabetes mellitus with complication, without long-term current use of insulin ([facility]) 04/24/2017  . ADD (attention deficit disorder) 12/30/2016  . Narcotic addiction ([facility]) 12/05/2014  . Excessive daytime sleepiness 12/05/2014  . Alteration in psychomotor activity 12/05/2014  . Depression with somatization 12/05/2014  . Hypersomnia, persistent 09/10/2013  . Obesity, unspecified 09/10/2013  . Chronic pain syndrome 09/10/2013  . Depression 07/31/2013  . Nausea alone 07/19/2013  . Medication intolerance 07/19/2013  . Anxiety 07/19/2013  . CAD (coronary artery disease) status post stent to distal right coronary artery 2006 06/20/2013  . Neurocardiogenic syncope 06/20/2013  . Essential hypertension 06/20/2013  . Hyperlipidemia 06/20/2013    Past Surgical History:  Procedure Laterality Date  . ANGIOPLASTY  03/01/2002   cutting balloon mid RCA  . BACK SURGERY    . CARDIAC CATHETERIZATION  01/26/2007   mild diffuse CAD  . CARDIAC CATHETERIZATION  10/23/2009   nonobstructive CAD,60% prox RCA,50% prox LAD, 50% mid ramus  . CORONARY STENT PLACEMENT  03/15/2005   distal RCA  . NEPHRECTOMY      OB History    Gravida Para Term Preterm AB Living   2 2 2     2    SAB TAB Ectopic Multiple Live Births  Home Medications    Prior to Admission medications   Medication Sig Start Date End Date Taking? Authorizing Provider  amphetamine-dextroamphetamine (ADDERALL) 20 MG tablet Take 1 tablet (20 mg total) by mouth 2 (two) times daily. 09/23/17 10/23/17  McVey, Madelaine Bhat, PA-C  amphetamine-dextroamphetamine (ADDERALL) 20 MG tablet Take 1 tablet (20 mg total) by mouth 2 (two) times daily. 10/24/17 11/23/17  McVey, Madelaine Bhat, PA-C  amphetamine-dextroamphetamine (ADDERALL) 20 MG tablet Take 1 tablet (20  mg total) by mouth 2 (two) times daily. 11/23/17 12/23/17  McVey, Madelaine Bhat, PA-C  aspirin 81 MG tablet Take 81 mg by mouth daily.    [provider]  atenolol (TENORMIN) 25 MG tablet Take 1 tab by mouth in the morning and 2 tab in the evening. Please schedule appointment for refills. 04/16/17   McVey, Madelaine Bhat, PA-C  fexofenadine (ALLEGRA) 180 MG tablet Take 180 mg by mouth daily as needed.     [provider]  FLUoxetine (PROZAC) 20 MG capsule Take 1 capsule (20 mg total) by mouth 2 (two) times daily. 08/06/17 09/05/17  McVey, Madelaine Bhat, PA-C  hydrochlorothiazide (HYDRODIURIL) 25 MG tablet Take 1 tablet (25 mg total) by mouth daily. 04/16/17   McVey, Madelaine Bhat, PA-C  HYDROcodone-acetaminophen (NORCO) 10-325 MG per tablet Take 1-2 tablets by mouth as needed.  09/28/14   [provider]  ibuprofen (ADVIL,MOTRIN) 800 MG tablet Take 800 mg by mouth every 8 (eight) hours as needed.    [provider]  levothyroxine (SYNTHROID, LEVOTHROID) 50 MCG tablet Take 1 tablet (50 mcg total) by mouth daily before breakfast. 03/07/17   McVey, Madelaine Bhat, PA-C  lisinopril (PRINIVIL,ZESTRIL) 20 MG tablet TAKE 1 TABLET BY MOUTH IN THE MORNING AND 1/2 (ONE-HALF) AT BEDTIME 08/02/17   McVey, Madelaine Bhat, PA-C  metFORMIN (GLUCOPHAGE) 500 MG tablet Take 1 tablet (500 mg total) by mouth 2 (two) times daily with a meal. Wk1: 1/2 tab 2x/day; Wk2: 1 tab am, 1/2 tab pm; Wk3: 2 tabs 2x/day. 04/16/17   McVey, Madelaine Bhat, PA-C  Misc. Devices (HUGO ROLLING WALKER ELITE) MISC 1 Units by Does not apply route daily as needed. 11/29/16   McVey, Madelaine Bhat, PA-C  potassium chloride (K-DUR,KLOR-CON) 10 MEQ tablet TAKE 1 TAB (10 MEQ TOTAL) BY MOUTH AS NEEDED (ONLY TAKES IF SHE DOES NOT EAT POTASSIUM RICH FOODS) 08/02/17   McVey, Madelaine Bhat, PA-C    Family History Family History  Problem Relation Age of Onset  . Hypertension Mother   . Diabetes  Father   . Heart attack Father   . Heart attack Brother     Social History Social History  Substance Use Topics  . Smoking status: Never Smoker  . Smokeless tobacco: Never Used  . Alcohol use No     Allergies   Cardizem [diltiazem hcl]; Codeine; Coreg [carvedilol]; Demerol [meperidine]; Erythromycin; Inderal [propranolol]; Lisinopril; Procardia [nifedipine]; Wellbutrin [bupropion]; and Zoloft [sertraline hcl]   Review of Systems Review of Systems  Constitutional: Positive for fatigue.  Gastrointestinal: Positive for nausea.  Endocrine: Positive for polydipsia.       Elevated BG   All other systems reviewed and are negative.    Physical Exam Updated Vital Signs BP (!) 149/62 (BP Location: Left Arm)   Pulse 77   Temp 97.9 F (36.6 C) (Oral)   Resp 16   SpO2 98%   Physical Exam  Constitutional: She is oriented to person, place, and time. She appears well-developed and well-nourished. No distress.  HENT:  Head: Normocephalic and atraumatic.  Mouth/Throat: Oropharynx is clear and moist.  Eyes: Pupils are equal, round, and reactive to light. Conjunctivae are normal.  Neck: Normal range of motion. Neck supple.  Cardiovascular: Normal rate, regular rhythm and normal heart sounds.   No murmur heard. Pulmonary/Chest: Effort normal and breath sounds normal. No respiratory distress.  Abdominal: Soft. Bowel sounds are normal. She exhibits no distension. There is no tenderness.  Musculoskeletal: Normal range of motion. She exhibits no edema.  Neurological: She is alert and oriented to person, place, and time.  Skin: Skin is warm and dry.  Psychiatric: She has a normal mood and affect.  Nursing note and vitals reviewed.    ED Treatments / Results  Labs (all labs ordered are listed, but only abnormal results are displayed) Labs Reviewed  BASIC METABOLIC PANEL - Abnormal; Notable for the following:       Result Value   Sodium 130 (*)    Potassium 3.4 (*)    Chloride 91  (*)    Glucose, Bld 442 (*)    BUN 30 (*)    Creatinine, Ser 1.08 (*)    GFR calc non Af Amer 48 (*)    GFR calc Af Amer 56 (*)    All other components within normal limits  CBG MONITORING, ED - Abnormal; Notable for the following:    Glucose-Capillary 411 (*)    All other components within normal limits  CBC  URINALYSIS, ROUTINE W REFLEX MICROSCOPIC    EKG  EKG Interpretation None       Radiology No results found.  Procedures Procedures (including critical care time)  Medications Ordered in ED Medications  sodium chloride 0.9 % bolus 500 mL (500 mLs Intravenous New Bag/Given 09/25/17 1349)  insulin aspart (novoLOG) injection 4 Units (4 Units Intravenous Given 09/25/17 1349)     Initial Impression / Assessment and Plan / ED Course  I have reviewed the triage vital signs and the nursing notes.  Pertinent labs & imaging results that were available during my care of the patient were reviewed by me and considered in my medical decision making (see chart for details).     MSE - Screen complete  1515 - Suspect that recent steroid injection (along with concurrent non compliance with metformin) has led to hyponatremia, hyperglycemia, and dehydration. Suspect that current hyperglycemia will not be controlled with metformin alone. Case discussed with Hospitalist Service - Dr. Roda ShuttersXu - service will be evaluating the patient for admission.     Final Clinical Impressions(s) / ED Diagnoses   Final diagnoses:  Hyperglycemia  Hyponatremia  Dehydration    New Prescriptions New Prescriptions   No medications on file     Wynetta FinesMessick, Peter C, MD 09/25/17 [phone]

## 2017-09-25 NOTE — H&P (Addendum)
History and Physical  [name] WGN:[id number] DOB: 08-Mar-1941 DOA: 09/25/2017  Referring physician: EDP PCP: Sebastian AcheMcVey, Elizabeth Whitney, PA-C   Chief Complaint: Elevated blood sugar  HPI: Kathleen DeerJanice B Williams is a 76 y.o. female  History of non-insulin-dependent type 2 diabetes, hypertension, history of mood disorder. She is a very Poor historian , conversation focused on reported h/o chronic fatigue syndrome.  HPI obtained from chart review, she was instructed by her primary care doctor to come to the ED for evaluation due to poorly controlled diabetes and medication noncompliance issue.  She waited 3 hours in the ED yesterday and left room left to ED. then decided to come back to the ED for further evaluation due to progressive fatigue and weakness.  ED course vitals stable, Blood sugar 400 on presentation, CBC unremarkable, BMP showed sodium 130 creatinine 1.08, UA concentrated with rare bacteria, 6-30 WBC, 0-5 RBC, negative nitrite, positive glucose, mildly positive ketones in the urine, negative protein.  Review of Systems:  Detail per HPI, Review of systems are otherwise negative  Past Medical History:  Diagnosis Date  . Abdominal discomfort    "due to medication intolerance"  . CAD (coronary artery disease)    previous stent  . Chronic pain syndrome 09/10/2013   Dr Ethelene Halamos,   . Excessive daytime sleepiness 12/05/2014   Patient has not been seen for a sleep study as ordered, and used adderall to keep alert in daytime.  She agreed  In a contract not to have any scheduled medication for pain treatment from [facility] and will not receive refills for Adderall, which was initiated by dr Ihor DowNnodi, PCP>   . Fall   . Hyperlipemia   . Hypersomnia, persistent 09/10/2013   Patient on  Stimulants .  Marland Kitchen. Hypertension   . Narcotic addiction (HCC) 12/05/2014  . Obesity, unspecified 09/10/2013  . Shoulder joint pain    both shoulders   Past Surgical History:  Procedure Laterality Date  . ANGIOPLASTY   03/01/2002   cutting balloon mid RCA  . BACK SURGERY    . CARDIAC CATHETERIZATION  01/26/2007   mild diffuse CAD  . CARDIAC CATHETERIZATION  10/23/2009   nonobstructive CAD,60% prox RCA,50% prox LAD, 50% mid ramus  . CORONARY STENT PLACEMENT  03/15/2005   distal RCA  . NEPHRECTOMY     Social History:  reports that she has never smoked. She has never used smokeless tobacco. She reports that she does not drink alcohol or use drugs. Patient lives at home & walks with a walker, with recurrent falls  Allergies  Allergen Reactions  . Cardizem [Diltiazem Hcl]     nausea  . Codeine     Feeling weird   . Coreg [Carvedilol]   . Demerol [Meperidine]     unknown  . Erythromycin     Very sick  . Inderal [Propranolol]     Made me cry  . Lisinopril Cough  . Procardia [Nifedipine]   . Wellbutrin [Bupropion]   . Zoloft [Sertraline Hcl]     Could not talk    Family History  Problem Relation Age of Onset  . Hypertension Mother   . Diabetes Father   . Heart attack Father   . Heart attack Brother       Prior to Admission medications   Medication Sig Start Date End Date Taking? Authorizing Provider  Alfalfa 250 MG TABS Take 1 tablet by mouth every morning.   Yes [provider]  amphetamine-dextroamphetamine (ADDERALL) 20 MG tablet Take 1  tablet (20 mg total) by mouth 2 (two) times daily. Patient taking differently: Take 20 mg by mouth 2 (two) times daily as needed.  09/23/17 10/23/17 Yes McVey, Madelaine Bhat, PA-C  aspirin 81 MG tablet Take 81 mg by mouth daily.   Yes [provider]  atenolol (TENORMIN) 25 MG tablet Take 1 tab by mouth in the morning and 2 tab in the evening. Please schedule appointment for refills. 04/16/17  Yes McVey, Madelaine Bhat, PA-C  Cholecalciferol (VITAMIN D3) 5000 units TBDP Take 1 capsule by mouth daily.   Yes [provider]  CHROMIUM ASPARTATE PO Take 1 tablet by mouth every morning.   Yes [provider]  Cinnamon  Bark POWD Take 1 Dose by mouth daily.   Yes [provider]  DHA-EPA-Coenzyme Q10-Vitamin E (GNP COQ-10 & FISH OIL) 120-180-50-30 CAPS Take 1 tablet by mouth daily.   Yes [provider]  fexofenadine (ALLEGRA) 180 MG tablet Take 180 mg by mouth daily as needed.    Yes [provider]  FLUoxetine (PROZAC) 20 MG capsule Take 1 capsule (20 mg total) by mouth 2 (two) times daily. 08/06/17 09/25/17 Yes McVey, Madelaine Bhat, PA-C  hydrochlorothiazide (HYDRODIURIL) 25 MG tablet Take 1 tablet (25 mg total) by mouth daily. 04/16/17  Yes McVey, Madelaine Bhat, PA-C  HYDROcodone-acetaminophen (NORCO) 10-325 MG per tablet Take 1-2 tablets by mouth every 4 (four) hours as needed for moderate pain.  09/28/14  Yes [provider]  ibuprofen (ADVIL,MOTRIN) 800 MG tablet Take 800 mg by mouth every 8 (eight) hours as needed.   Yes [provider]  levothyroxine (SYNTHROID, LEVOTHROID) 50 MCG tablet Take 1 tablet (50 mcg total) by mouth daily before breakfast. 03/07/17  Yes McVey, Madelaine Bhat, PA-C  lisinopril (PRINIVIL,ZESTRIL) 20 MG tablet TAKE 1 TABLET BY MOUTH IN THE MORNING AND 1/2 (ONE-HALF) AT BEDTIME 08/02/17  Yes McVey, Madelaine Bhat, PA-C  magnesium oxide (MAG-OX) 400 MG tablet Take 400 mg by mouth 2 (two) times daily.   Yes [provider]  potassium chloride (K-DUR,KLOR-CON) 10 MEQ tablet TAKE 1 TAB (10 MEQ TOTAL) BY MOUTH AS NEEDED (ONLY TAKES IF SHE DOES NOT EAT POTASSIUM RICH FOODS) 08/02/17  Yes McVey, Madelaine Bhat, PA-C  amphetamine-dextroamphetamine (ADDERALL) 20 MG tablet Take 1 tablet (20 mg total) by mouth 2 (two) times daily. 10/24/17 11/23/17  McVey, Madelaine Bhat, PA-C  amphetamine-dextroamphetamine (ADDERALL) 20 MG tablet Take 1 tablet (20 mg total) by mouth 2 (two) times daily. 11/23/17 12/23/17  McVey, Madelaine Bhat, PA-C  metFORMIN (GLUCOPHAGE) 500 MG tablet Take 1 tablet (500 mg total) by mouth 2 (two) times daily with  a meal. Wk1: 1/2 tab 2x/day; Wk2: 1 tab am, 1/2 tab pm; Wk3: 2 tabs 2x/day. Patient not taking: Reported on 09/25/2017 04/16/17   McVey, Madelaine Bhat, PA-C  Misc. Devices (HUGO ROLLING WALKER ELITE) MISC 1 Units by Does not apply route daily as needed. 11/29/16   McVey, Madelaine Bhat, PA-C    Physical Exam: BP (!) 149/62 (BP Location: Left Arm)   Pulse 77   Temp 97.9 F (36.6 C) (Oral)   Resp 16   SpO2 98%   General:  Drowsy, oriented x3, poor memory, poor historian Eyes: PERRL ENT: unremarkable Neck: supple, no JVD Cardiovascular: RRR with ectopic beats Respiratory: CTABL Abdomen: soft/ND/ND, positive bowel sounds Skin: no rash Musculoskeletal:  No edema Psychiatric: calm, but unattentive , tangential thought process Neurologic: no focal findings  Labs on Admission:  Basic Metabolic Panel:  Recent Labs Lab 09/23/17 1803 09/25/17 1240  NA 130* 130*  K 4.3 3.4*  CL 87* 91*  CO2 23 24  GLUCOSE 551* 442*  BUN 22 30*  CREATININE 1.16* 1.08*  CALCIUM 9.7 9.3   Liver Function Tests:  Recent Labs Lab 09/23/17 1803  AST 17  ALT 21  ALKPHOS 119*  BILITOT 0.3  PROT 6.7  ALBUMIN 4.0   No results for input(s): LIPASE, AMYLASE in the last 168 hours. No results for input(s): AMMONIA in the last 168 hours. CBC:  Recent Labs Lab 09/25/17 1240  WBC 4.5  HGB 12.9  HCT 37.9  MCV 82.6  PLT 245   Cardiac Enzymes: No results for input(s): CKTOTAL, CKMB, CKMBINDEX, TROPONINI in the last 168 hours.  BNP (last 3 results) No results for input(s): BNP in the last 8760 hours.  ProBNP (last 3 results) No results for input(s): PROBNP in the last 8760 hours.  CBG:  Recent Labs Lab 09/24/17 1516 09/25/17 1239 09/25/17 1452  GLUCAP 334* 411* 402*    Radiological Exams on Admission: No results found.    Assessment/Plan Present on Admission: . Hyperglycemia  Uncontrolled diabetes with significant dehydration, hyponatremia A1c14, not in  DKA Continue hydration, start continue hydration, diabetes education  Hyponatremia  likely combination of dehydration and pseudohyponatremia Continue hydration, adjust insulin  Hypokalemia  replace K check mag  CKD 3 Renal dosing meds  Poor memory  CT head, she reports she can not get mri" due to mentals in her head"  Hypertension   will continue atenolol, start low-dose Imdur  lisinopril is listed on her allergy list  History of mood disorder, continue Adderall  H/o falls, will get pt eval  DVT prophylaxis:  lovenox  Consultants: none  Code Status: full   Family Communication:  Patient and husband at bedside  Disposition Plan: admit to med surg  Time spent:  Deovion Batrez MD, PhD [facility] Pager [phone] If 7PM-7AM, please contact night-coverage at [email or web address], password [facility]

## 2017-09-25 NOTE — ED Notes (Signed)
Bed: [id number] Expected date:  Expected time:  Means of arrival:  Comments: EMS: generalized weakness

## 2017-09-25 NOTE — ED Notes (Signed)
ED TO INPATIENT HANDOFF REPORT  Name/Age/Gender [name] 76 y.o. female  Code Status Code Status History    This patient does not have a recorded code status. Please follow your organizational policy for patients in this situation.    Advance Directive Documentation     Most Recent Value  Type of Advance Directive  Living will  Pre-existing out of facility DNR order (yellow form or pink MOST form)  -  "MOST" Form in Place?  -      Home/SNF/Other Home  Chief Complaint weakness  Level of Care/Admitting Diagnosis ED Disposition    ED Disposition Condition [place]: [facility] [[id number]]  Level of Care: Med-Surg [16]  Diagnosis: Hyperglycemia [875643]  Admitting Physician: Florencia Reasons [[id number]]  Attending Physician: Florencia Reasons [[id number]]  PT Class (Do Not Modify): Observation [104]  PT Acc Code (Do Not Modify): Observation [10022]       Medical History Past Medical History:  Diagnosis Date  . Abdominal discomfort    "due to medication intolerance"  . CAD (coronary artery disease)    previous stent  . Chronic pain syndrome 09/10/2013   Dr Nelva Bush,   . Excessive daytime sleepiness 12/05/2014   Patient has not been seen for a sleep study as ordered, and used adderall to keep alert in daytime.  She agreed  In a contract not to have any scheduled medication for pain treatment from [place] and will not receive refills for Adderall, which was initiated by dr Orland Penman, PCP>   . Fall   . Hyperlipemia   . Hypersomnia, persistent 09/10/2013   Patient on  Stimulants .  Marland Kitchen Hypertension   . Narcotic addiction ([place]) 12/05/2014  . Obesity, unspecified 09/10/2013  . Shoulder joint pain    both shoulders    Allergies Allergies  Allergen Reactions  . Cardizem [Diltiazem Hcl]     nausea  . Codeine     Feeling weird   . Coreg [Carvedilol]   . Demerol [Meperidine]     unknown  . Erythromycin     Very sick  . Inderal [Propranolol]     Made  me cry  . Lisinopril Cough  . Procardia [Nifedipine]   . Wellbutrin [Bupropion]   . Zoloft [Sertraline Hcl]     Could not talk    IV Location/Drains/Wounds Patient Lines/Drains/Airways Status   Active Line/Drains/Airways    Name:   Placement date:   Placement time:   Site:   Days:   Peripheral IV 09/25/17 Left Antecubital  09/25/17    1259    Antecubital    less than 1          Labs/Imaging Results for orders placed or performed during the hospital encounter of 09/25/17 (from the past 48 hour(s))  CBG monitoring, ED     Status: Abnormal   Collection Time: 09/25/17 12:39 PM  Result Value Ref Range   Glucose-Capillary 411 (H) 65 - 99 mg/dL  Basic metabolic panel     Status: Abnormal   Collection Time: 09/25/17 12:40 PM  Result Value Ref Range   Sodium 130 (L) 135 - 145 mmol/L   Potassium 3.4 (L) 3.5 - 5.1 mmol/L   Chloride 91 (L) 101 - 111 mmol/L   CO2 24 22 - 32 mmol/L   Glucose, Bld 442 (H) 65 - 99 mg/dL   BUN 30 (H) 6 - 20 mg/dL   Creatinine, Ser 1.08 (H) 0.44 - 1.00 mg/dL   Calcium  9.3 8.9 - 10.3 mg/dL   GFR calc non Af Amer 48 (L) >60 mL/min   GFR calc Af Amer 56 (L) >60 mL/min    Comment: (NOTE) The eGFR has been calculated using the CKD EPI equation. This calculation has not been validated in all clinical situations. eGFR's persistently <60 mL/min signify possible Chronic Kidney Disease.    Anion gap 15 5 - 15  CBC     Status: None   Collection Time: 09/25/17 12:40 PM  Result Value Ref Range   WBC 4.5 4.0 - 10.5 K/uL   RBC 4.59 3.87 - 5.11 MIL/uL   Hemoglobin 12.9 12.0 - 15.0 g/dL   HCT 37.9 36.0 - 46.0 %   MCV 82.6 78.0 - 100.0 fL   MCH 28.1 26.0 - 34.0 pg   MCHC 34.0 30.0 - 36.0 g/dL   RDW 12.9 11.5 - 15.5 %   Platelets 245 150 - 400 K/uL  CBG monitoring, ED     Status: Abnormal   Collection Time: 09/25/17  2:52 PM  Result Value Ref Range   Glucose-Capillary 402 (H) 65 - 99 mg/dL   No results found.  Pending Labs [employer]    Start      Ordered   09/26/17 0500  CBC  Tomorrow morning,   R     09/25/17 1549   09/26/17 7026  Basic metabolic panel  Tomorrow morning,   R     09/25/17 1549   09/26/17 0500  Magnesium  Tomorrow morning,   R     09/25/17 1549   09/25/17 1240  Urinalysis, Routine w reflex microscopic  Once,   STAT     09/25/17 1239      Vitals/Pain Today's Vitals   09/25/17 1246  BP: (!) 149/62  Pulse: 77  Resp: 16  Temp: 97.9 F (36.6 C)  TempSrc: Oral  SpO2: 98%    Isolation Precautions No active isolations  Medications Medications  sodium chloride 0.9 % bolus 500 mL (not administered)  sodium chloride 0.9 % bolus 1,000 mL (not administered)  0.9 %  sodium chloride infusion (not administered)  living well with diabetes book MISC (not administered)  sodium chloride 0.9 % bolus 500 mL (0 mLs Intravenous Stopped 09/25/17 1433)  insulin aspart (novoLOG) injection 4 Units (4 Units Intravenous Given 09/25/17 1349)    Mobility walks with person assist

## 2017-09-25 NOTE — ED Notes (Signed)
Patient taken to floor.

## 2017-09-26 DIAGNOSIS — Z905 Acquired absence of kidney: Secondary | ICD-10-CM | POA: Diagnosis not present

## 2017-09-26 DIAGNOSIS — E876 Hypokalemia: Secondary | ICD-10-CM | POA: Diagnosis present

## 2017-09-26 DIAGNOSIS — R739 Hyperglycemia, unspecified: Secondary | ICD-10-CM

## 2017-09-26 DIAGNOSIS — Z7982 Long term (current) use of aspirin: Secondary | ICD-10-CM | POA: Diagnosis not present

## 2017-09-26 DIAGNOSIS — E1165 Type 2 diabetes mellitus with hyperglycemia: Secondary | ICD-10-CM | POA: Diagnosis present

## 2017-09-26 DIAGNOSIS — Z8249 Family history of ischemic heart disease and other diseases of the circulatory system: Secondary | ICD-10-CM | POA: Diagnosis not present

## 2017-09-26 DIAGNOSIS — Z91128 Patient's intentional underdosing of medication regimen for other reason: Secondary | ICD-10-CM | POA: Diagnosis not present

## 2017-09-26 DIAGNOSIS — I2510 Atherosclerotic heart disease of native coronary artery without angina pectoris: Secondary | ICD-10-CM | POA: Diagnosis present

## 2017-09-26 DIAGNOSIS — E871 Hypo-osmolality and hyponatremia: Secondary | ICD-10-CM | POA: Diagnosis not present

## 2017-09-26 DIAGNOSIS — E1122 Type 2 diabetes mellitus with diabetic chronic kidney disease: Secondary | ICD-10-CM | POA: Diagnosis present

## 2017-09-26 DIAGNOSIS — E860 Dehydration: Secondary | ICD-10-CM

## 2017-09-26 DIAGNOSIS — F39 Unspecified mood [affective] disorder: Secondary | ICD-10-CM | POA: Diagnosis present

## 2017-09-26 DIAGNOSIS — E118 Type 2 diabetes mellitus with unspecified complications: Secondary | ICD-10-CM | POA: Diagnosis not present

## 2017-09-26 DIAGNOSIS — Y92009 Unspecified place in unspecified non-institutional (private) residence as the place of occurrence of the external cause: Secondary | ICD-10-CM | POA: Diagnosis not present

## 2017-09-26 DIAGNOSIS — Z7984 Long term (current) use of oral hypoglycemic drugs: Secondary | ICD-10-CM | POA: Diagnosis not present

## 2017-09-26 DIAGNOSIS — Z955 Presence of coronary angioplasty implant and graft: Secondary | ICD-10-CM | POA: Diagnosis not present

## 2017-09-26 DIAGNOSIS — Z833 Family history of diabetes mellitus: Secondary | ICD-10-CM | POA: Diagnosis not present

## 2017-09-26 DIAGNOSIS — R296 Repeated falls: Secondary | ICD-10-CM | POA: Diagnosis present

## 2017-09-26 DIAGNOSIS — T383X6A Underdosing of insulin and oral hypoglycemic [antidiabetic] drugs, initial encounter: Secondary | ICD-10-CM | POA: Diagnosis present

## 2017-09-26 DIAGNOSIS — N183 Chronic kidney disease, stage 3 (moderate): Secondary | ICD-10-CM | POA: Diagnosis present

## 2017-09-26 DIAGNOSIS — Z79899 Other long term (current) drug therapy: Secondary | ICD-10-CM | POA: Diagnosis not present

## 2017-09-26 DIAGNOSIS — I129 Hypertensive chronic kidney disease with stage 1 through stage 4 chronic kidney disease, or unspecified chronic kidney disease: Secondary | ICD-10-CM | POA: Diagnosis present

## 2017-09-26 LAB — BASIC METABOLIC PANEL
ANION GAP: 7 (ref 5–15)
BUN: 22 mg/dL — ABNORMAL HIGH (ref 6–20)
CALCIUM: 9.2 mg/dL (ref 8.9–10.3)
CO2: 26 mmol/L (ref 22–32)
CREATININE: 0.8 mg/dL (ref 0.44–1.00)
Chloride: 105 mmol/L (ref 101–111)
GFR calc non Af Amer: >60 mL/min (ref >60)
Glucose, Bld: 282 mg/dL — ABNORMAL HIGH (ref 65–99)
Potassium: 4.6 mmol/L (ref 3.5–5.1)
SODIUM: 138 mmol/L (ref 135–145)

## 2017-09-26 LAB — GLUCOSE, CAPILLARY
GLUCOSE-CAPILLARY: 256 mg/dL — AB (ref 65–99)
Glucose-Capillary: 213 mg/dL — ABNORMAL HIGH (ref 65–99)
Glucose-Capillary: 261 mg/dL — ABNORMAL HIGH (ref 65–99)
Glucose-Capillary: 317 mg/dL — ABNORMAL HIGH (ref 65–99)

## 2017-09-26 LAB — CBC
HEMATOCRIT: 35.4 % — AB (ref 36.0–46.0)
Hemoglobin: 11.6 g/dL — ABNORMAL LOW (ref 12.0–15.0)
MCH: 27.5 pg (ref 26.0–34.0)
MCHC: 32.8 g/dL (ref 30.0–36.0)
MCV: 83.9 fL (ref 78.0–100.0)
PLATELETS: 224 10*3/uL (ref 150–400)
RBC: 4.22 MIL/uL (ref 3.87–5.11)
RDW: 12.8 % (ref 11.5–15.5)
WBC: 4.6 10*3/uL (ref 4.0–10.5)

## 2017-09-26 LAB — MAGNESIUM: MAGNESIUM: 1.8 mg/dL (ref 1.7–2.4)

## 2017-09-26 MED ORDER — INSULIN GLARGINE 100 UNIT/ML ~~LOC~~ SOLN
24.0000 [IU] | Freq: Every day | SUBCUTANEOUS | Status: DC
  Administered 2017-09-26: 24 [IU] via SUBCUTANEOUS
  Filled 2017-09-26 (×2): qty 0.24

## 2017-09-26 MED ORDER — HYDROCHLOROTHIAZIDE 12.5 MG PO CAPS
12.5000 mg | ORAL_CAPSULE | Freq: Every day | ORAL | Status: DC
  Administered 2017-09-27: 12.5 mg via ORAL
  Filled 2017-09-26 (×2): qty 1

## 2017-09-26 MED ORDER — INSULIN STARTER KIT- PEN NEEDLES (ENGLISH)
1.0000 | Freq: Once | Status: AC
  Administered 2017-09-26: 1
  Filled 2017-09-26: qty 1

## 2017-09-26 MED ORDER — HYDROCODONE-ACETAMINOPHEN 5-325 MG PO TABS
1.0000 | ORAL_TABLET | Freq: Four times a day (QID) | ORAL | Status: DC | PRN
  Administered 2017-09-26 – 2017-09-27 (×2): 1 via ORAL
  Filled 2017-09-26 (×2): qty 1

## 2017-09-26 MED ORDER — HYDRALAZINE HCL 25 MG PO TABS
25.0000 mg | ORAL_TABLET | Freq: Four times a day (QID) | ORAL | Status: DC | PRN
  Administered 2017-09-27: 25 mg via ORAL
  Filled 2017-09-26: qty 1

## 2017-09-26 NOTE — Progress Notes (Signed)
Refused hs imdur. Stated " I'm not taking any news medications" reviewed importance of medication complience

## 2017-09-26 NOTE — Evaluation (Signed)
Physical Therapy Evaluation Patient Details Name: [name] Onofrio MRN: [id number] DOB: May 29, 1941 Today's Date: 09/26/2017   History of Present Illness  76 yo female admitted with hyperglycemia, dehydration, hyponatremia. Hx of DM, HTN, chronic fatigue syndrome, narcotic addiction, mood d/o, obesity, recurrent falls.     Clinical Impression  On eval, pt required Min assist for mobility. She walked ~10 feet in the room with 1 HHA. Pt stated she could not do very much this am because she had not had her pain meds and that she needed more time for her Adderall to work. Pt presents with general weakness, decreased activity tolerance, and impaired gait and balance. Pt tends to perseverate on her chronic fatigue syndrome diagnosis. Pt stated that her husband helps at home but he has limitations. She is agreeable to having home health f/u. Will follow and progress activity as tolerated.     Follow Up Recommendations Home health PT; Home Health OT; Home Health Aide;Supervision/Assistance - 24 hour    Equipment Recommendations  None recommended by PT    Recommendations for Other Services       Precautions / Restrictions Precautions Precautions: Fall Restrictions Weight Bearing Restrictions: No      Mobility  Bed Mobility Overal bed mobility: Needs Assistance Bed Mobility: Supine to Sit;Sit to Supine     Supine to sit: Min assist;HOB elevated Sit to supine: Min guard;HOB elevated   General bed mobility comments: Assist for trunk. Increased time.   Transfers Overall transfer level: Needs assistance Equipment used: 1 person hand held assist Transfers: Sit to/from Stand Sit to Stand: Min assist         General transfer comment: Assist to rise, stabilize.   Ambulation/Gait Ambulation/Gait assistance: Min assist Ambulation Distance (Feet): 10 Feet Assistive device: 1 person hand held assist Gait Pattern/deviations: Step-through pattern;Decreased stride length     General Gait  Details: Small amount of assist to steady. Pt walked around room.   Stairs            Wheelchair Mobility    Modified Rankin (Stroke Patients Only)       Balance Overall balance assessment: Needs assistance;History of Falls           Standing balance-Leahy Scale: Fair Standing balance comment: Pt able to stand statically with Min guard assist. She require at least single extremity support for ambulation                             Pertinent Vitals/Pain Pain Assessment: 0-10 Pain Score: 7  Pain Location: bil shoulders, bil knees Pain Descriptors / Indicators: Aching;Sore Pain Intervention(s): Limited activity within patient's tolerance;Repositioned    Home Living Family/patient expects to be discharged to:: Private residence Living Arrangements: Spouse/significant other Available Help at Discharge: Family Type of Home: House Home Access: Stairs to enter Entrance Stairs-Rails: None Entrance Stairs-Number of Steps: back-1 step Home Layout: One level Home Equipment: Environmental consultantWalker - 4 wheels      Prior Function Level of Independence: Needs assistance   Gait / Transfers Assistance Needed: uses RW for ambulation. does not really use walker inside of home.      Comments: pt stated she is essentially homebound except for MD visits     Hand Dominance        Extremity/Trunk Assessment   Upper Extremity Assessment Upper Extremity Assessment: Generalized weakness (limited shoulder flex to ~80-85 degrees bilaterally due to pain)    Lower Extremity Assessment Lower Extremity  Assessment: Generalized weakness    Cervical / Trunk Assessment Cervical / Trunk Assessment: Normal  Communication   Communication: No difficulties  Cognition Arousal/Alertness: Awake/alert Behavior During Therapy: WFL for tasks assessed/performed Overall Cognitive Status: Within Functional Limits for tasks assessed                                 General Comments:  pt talks excessively about medical history-specifically chronic fatigue syndrome      General Comments      Exercises     Assessment/Plan    PT Assessment Patient needs continued PT services  PT Problem List Decreased strength;Decreased mobility;Decreased range of motion;Decreased activity tolerance;Decreased balance;Pain       PT Treatment Interventions DME instruction;Gait training;Therapeutic exercise;Patient/family education;Therapeutic activities;Balance training;Functional mobility training    PT Goals (Current goals can be found in the Care Plan section)  Acute Rehab PT Goals Patient Stated Goal: home PT Goal Formulation: With patient Time For Goal Achievement: 10/10/17 Potential to Achieve Goals: Fair    Frequency Min 3X/week   Barriers to discharge        Co-evaluation               AM-PAC PT "6 Clicks" Daily Activity  Outcome Measure Difficulty turning over in bed (including adjusting bedclothes, sheets and blankets)?: A Lot Difficulty moving from lying on back to sitting on the side of the bed? : Unable Difficulty sitting down on and standing up from a chair with arms (e.g., wheelchair, bedside commode, etc,.)?: Unable Help needed moving to and from a bed to chair (including a wheelchair)?: A Little Help needed walking in hospital room?: A Little Help needed climbing 3-5 steps with a railing? : A Lot 6 Click Score: 12    End of Session   Activity Tolerance: Patient limited by fatigue;Patient limited by pain Patient left: in bed;with call bell/phone within reach   PT Visit Diagnosis: Muscle weakness (generalized) (M62.81);Difficulty in walking, not elsewhere classified (R26.2);History of falling (Z91.81)    Time: 0900-0930 PT Time Calculation (min) (ACUTE ONLY): 30 min   Charges:   PT Evaluation $PT Eval Moderate Complexity: 1 Mod     PT G Codes:   PT G-Codes **NOT FOR INPATIENT CLASS** Functional Assessment Tool Used: AM-PAC 6 Clicks Basic  Mobility;Clinical judgement Functional Limitation: Mobility: Walking and moving around Mobility: Walking and Moving Around Current Status (Z6109): At least 20 percent but less than 40 percent impaired, limited or restricted Mobility: Walking and Moving Around Goal Status [phone]): At least 1 percent but less than 20 percent impaired, limited or restricted     Rebeca Alert, MPT Pager: [phone]

## 2017-09-26 NOTE — Progress Notes (Addendum)
Inpatient Diabetes Program Recommendations  AACE/ADA: New Consensus Statement on Inpatient Glycemic Control (2015)  Target Ranges:  Prepandial:   less than 140 mg/dL      Peak postprandial:   less than 180 mg/dL (1-2 hours)      Critically ill patients:  140 - 180 mg/dL   Lab Results  Component Value Date   GLUCAP 256 (H) 09/26/2017   HGBA1C 14.0 (H) 09/23/2017    Spoke with patient about diabetes and home regimen for diabetes control. Patient reports that she tried to take Metformin as prescribed and her husband tries to remember but is not able to do and remember as much as he has in the past. Discussed A1C results (14% on 09/23/17). Discussed glucose and A1C goals. Discussed importance of checking CBGs and maintaining good CBG control to prevent long-term and short-term complications. Explained how hyperglycemia leads to damage within blood vessels which lead to the common complications seen with uncontrolled diabetes. Stressed to the patient the importance of improving glycemic control to prevent further complications from uncontrolled diabetes. Discussed impact of nutrition, exercise, stress, sickness, and medications on diabetes control. Discussed insulin at time of d/c. Patient is agreeable. Walked patient through operating insulin pen. Will order insulin pen starter kit with the steps of administration to help patient. Patient reports her husband and herself do not have anyone checking in on them. Patient would benefit from reminders and or alarms being set on her phone before d/c to remember to take medications. Patient verbalized understanding of information discussed and he states that he has no further questions at this time related to diabetes.   Patient and  Thanks,  Tama Headings RN, MSN, [facility] Inpatient Diabetes Coordinator Team Pager [phone] (8a-5p)

## 2017-09-26 NOTE — Progress Notes (Signed)
Inpatient Diabetes Program Recommendations  AACE/ADA: New Consensus Statement on Inpatient Glycemic Control (2015)  Target Ranges:  Prepandial:   less than 140 mg/dL      Peak postprandial:   less than 180 mg/dL (1-2 hours)      Critically ill patients:  140 - 180 mg/dL   Review of Glycemic Control  Diabetes history: DM 2 Outpatient Diabetes medications: Metformin 500 mg BID Current orders for Inpatient glycemic control: Lantus 20 units, Novolog Moderate Correction 0-15 units tid  A1c 14% this admission  Inpatient Diabetes Program Recommendations:    Due to A1c level, patient would benefit from Lantus and Metformin at time of d/c. Patient will need to follow up with PCP regularly. Will see patient today. Glucose still elevated in the 200 range this am. Consider increasing Lantus to 24 units.  Thanks,  Christena DeemShannon Mical Brun RN, MSN, [facility] Inpatient Diabetes Coordinator Team Pager [phone] (8a-5p)

## 2017-09-26 NOTE — Progress Notes (Signed)
  RD consulted for nutrition education regarding diabetes.   Lab Results  Component Value Date   HGBA1C 14.0 (H) 09/23/2017    RD provided "Carbohydrate Counting for People with Diabetes" handout from the Academy of [employer] and Dietetics. Discussed different food groups and their effects on blood sugar, emphasizing carbohydrate-containing foods. Provided list of carbohydrates and recommended serving sizes of common foods.  Discussed importance of controlled and consistent carbohydrate intake throughout the day. Provided examples of ways to balance meals/snacks and encouraged intake of high-fiber, whole grain complex carbohydrates. Teach back method used.  Husband prepares food for each day. Pt has on and off meals related to husband's schedule. States she sometimes goes meals without eating. Discussed the importance of eating three meals with snacks per day, and possibly choosing options she can make for herself. Observed lunch tray at bedside and practiced with pt calculating carbohydrates. Pt seems knowledgeable on how to read food labels and which items contain carbohydrates.   Expect fair compliance.  There is no height or weight on file to calculate BMI. Pt meets criteria for overweight based on current BMI.  Current diet order is Carb Modified, patient is consuming approximately 100% of meals at this time. Labs and medications reviewed. No further nutrition interventions warranted at this time. RD contact information provided. If additional nutrition issues arise, please re-consult RD.  Vanessa Kickarly Dot Splinter RD, LDN Clinical Nutrition Pager # [phone]- [phone]

## 2017-09-26 NOTE — Progress Notes (Signed)
On call notified of pt requesting norco to be restarted.

## 2017-09-26 NOTE — Progress Notes (Signed)
[facility]  PROGRESS NOTE  [name] TXM:[id number] DOB: Jan 16, 1941 DOA: 09/25/2017 PCP: Dorise Hiss, PA-C   Brief HPI:   76 y.o. female  with history of non-insulin-dependent type 2 diabetes, hypertension, history of mood disorder. She is a very Poor historian , conversation focused on reported h/o chronic fatigue syndrome.  HPI obtained from chart review, she was instructed by her primary care doctor to come to the ED for evaluation due to poorly controlled diabetes and medication noncompliance issue.  She waited 3 hours in the ED yesterday and left room left to ED. then decided to come back to the ED for further evaluation due to progressive fatigue and weakness    Subjective   Patient seen and examined, denies any complaints.  Blood glucose is in 200s.  Patient started on Lantus in the hospital.   Assessment/Plan:     1. Uncontrolled diabetes mellitus-patient's hemoglobin A1c is 14.  Blood glucose is better now.  Will increase Lantus to 24 units subcu daily.  Continue sliding scale insulin with NovoLog. 2. Hyponatremia-patient presented with hyponatremia, with sodium 130.  Likely pseudohyponatremia from hyperglycemia.  Currently it has resolved.  Today serum sodium is 138. 3. Chronic kidney disease stage III-creatinine has improved, today creatinine 0.80.   4.  Hypertension-blood pressure is elevated.  Continue atenolol 12.5 mg p.o. twice daily.  Patient has multiple allergies listed.  Will start hydrochlorothiazide 12.5 mg p.o. Daily.Hydralazine as needed for BP more than 160/100 5. History of mood disorder-continue Adderall 6.     DVT prophylaxis: Lovenox  Code Status: Full code  Family Communication: No family at bedside likely home  Disposition Plan: likely home in next 24 hours    Consultants:  None  Procedures:  None  Continuous infusions     Antibiotics:   Anti-infectives    None       Objective   Vitals:   09/25/17  2104 09/26/17 0619 09/26/17 0751 09/26/17 1314  BP: (!) 146/60 137/77 (!) 179/56 (!) 172/77  Pulse: 74 70 67 70  Resp:  '17 14 16  ' Temp: 97.8 F (36.6 C) (!) 97.4 F (36.3 C) 97.9 F (36.6 C) 97.6 F (36.4 C)  TempSrc: Oral Oral Oral Oral  SpO2: 96% 98% 96% 97%    Intake/Output Summary (Last 24 hours) at 09/26/17 1642 Last data filed at 09/26/17 1424  Gross per 24 hour  Intake          1311.25 ml  Output             3400 ml  Net         -2088.75 ml   There were no vitals filed for this visit.   Physical Examination:   Physical Exam: Eyes: No icterus, extraocular muscles intact  Mouth: Oral mucosa is moist, no lesions on palate,  Neck: Supple, no deformities, masses, or tenderness Lungs: Normal respiratory effort, bilateral clear to auscultation, no crackles or wheezes.  Heart: Regular rate and rhythm, S1 and S2 normal, no murmurs, rubs auscultated Abdomen: BS normoactive,soft,nondistended,non-tender to palpation,no organomegaly Extremities: No pretibial edema, no erythema, no cyanosis, no clubbing Neuro : Alert and oriented to time, place and person, No focal deficits  Skin: No rashes seen on exam       Data Reviewed: I have personally reviewed following labs and imaging studies  CBG:  Recent Labs Lab 09/25/17 1452 09/25/17 1757 09/25/17 2107 09/26/17 0718 09/26/17 1257  GLUCAP 402* 355* 302* 256* 213*    CBC:  Recent Labs Lab 09/25/17 1240 09/26/17 0606  WBC 4.5 4.6  HGB 12.9 11.6*  HCT 37.9 35.4*  MCV 82.6 83.9  PLT 245 665    Basic Metabolic Panel:  Recent Labs Lab 09/23/17 1803 09/25/17 1240 09/26/17 0606  NA 130* 130* 138  K 4.3 3.4* 4.6  CL 87* 91* 105  CO2 '23 24 26  ' GLUCOSE 551* 442* 282*  BUN 22 30* 22*  CREATININE 1.16* 1.08* 0.80  CALCIUM 9.7 9.3 9.2  MG  --   --  1.8    No results found for this or any previous visit (from the past 240 hour(s)).   Liver Function Tests:  Recent Labs Lab 09/23/17 1803  AST 17  ALT  21  ALKPHOS 119*  BILITOT 0.3  PROT 6.7  ALBUMIN 4.0   No results for input(s): LIPASE, AMYLASE in the last 168 hours. No results for input(s): AMMONIA in the last 168 hours.  Cardiac Enzymes: No results for input(s): CKTOTAL, CKMB, CKMBINDEX, TROPONINI in the last 168 hours. BNP (last 3 results) No results for input(s): BNP in the last 8760 hours.  ProBNP (last 3 results) No results for input(s): PROBNP in the last 8760 hours.    Studies: Ct Head Wo Contrast  Result Date: 09/25/2017 CLINICAL DATA:  Confusion and nausea. EXAM: CT HEAD WITHOUT CONTRAST TECHNIQUE: Contiguous axial images were obtained from the base of the skull through the vertex without intravenous contrast. COMPARISON:  May 13, 2017 FINDINGS: Brain: Mild to moderate diffuse atrophy is stable. There is no intracranial mass, hemorrhage, extra-axial fluid collection, or midline shift. There is small vessel disease throughout much of the centra semiovale bilaterally, stable. No new gray-white compartment lesion evident. No acute infarct is appreciable. Vascular: There is no evident hyperdense vessel. There is calcification in each carotid siphon region as well as in each distal vertebral artery. Skull: Bony calvarium appears intact. Sinuses/Orbits: There is opacification in the right sphenoid sinus with an air-fluid level in this area. Other visualized paranasal sinuses are clear. Visualized orbits appear symmetric bilaterally. Other: Mastoid air cells are clear. IMPRESSION: 1. Stable atrophy with supratentorial small vessel disease. No intracranial mass, hemorrhage, or extra-axial fluid collection. No evident acute infarct. 2.  Multiple foci of arterial vascular calcification noted. 3.  Opacification with air-fluid level in the right sphenoid sinus. Electronically Signed   By: Lowella Grip III M.D.   On: 09/25/2017 16:29    Scheduled Meds: . amphetamine-dextroamphetamine  20 mg Oral BID WC  . aspirin EC  81 mg Oral  Daily  . atenolol  12.5 mg Oral BID  . atorvastatin  20 mg Oral q1800  . enoxaparin (LOVENOX) injection  40 mg Subcutaneous Q24H  . FLUoxetine  20 mg Oral BID  . fluticasone  1 spray Each Nare Daily  . insulin aspart  0-15 Units Subcutaneous TID WC  . insulin glargine  20 Units Subcutaneous QHS  . insulin starter kit- pen needles  1 kit Other Once  . isosorbide mononitrate  15 mg Oral Daily  . levothyroxine  50 mcg Oral QAC breakfast  . loratadine  10 mg Oral Daily      Time spent: 20 min  [facility] Pager [phone]. If 7PM-7AM, please contact night-coverage at [email or web address], Office  [phone]  password [email or web address]  09/26/2017, 4:42 PM  LOS: 0 days

## 2017-09-26 NOTE — Care Management Note (Signed)
Case Management Note  Patient Details  Name: [name] MRN: [id number] Date of Birth: 08-21-1941  Subjective/Objective:                  hyperglycemia  Action/Plan: Date:  September 26, 2017 Chart reviewed for concurrent status and case management needs.  Will continue to follow patient progress.  Discharge Planning: following for needs  Expected discharge date: September 29, 2017  [name], BSN, [place]RN3, [place]CCM   [phone]   Expected Discharge Date:   (unknown)               Expected Discharge Plan:  Home/Self Care  In-House Referral:     Discharge planning Services  CM Consult  Post Acute Care Choice:    Choice offered to:     DME Arranged:    DME Agency:     HH Arranged:    HH Agency:     Status of Service:  In process, will continue to follow  If discussed at Long Length of Stay Meetings, dates discussed:    Additional Comments:  [name], Kathleen Ferraiolo Lynn, RN 09/26/2017, 9:32 AM

## 2017-09-27 DIAGNOSIS — E118 Type 2 diabetes mellitus with unspecified complications: Secondary | ICD-10-CM

## 2017-09-27 LAB — BASIC METABOLIC PANEL
ANION GAP: 10 (ref 5–15)
BUN: 15 mg/dL (ref 6–20)
CALCIUM: 9.3 mg/dL (ref 8.9–10.3)
CO2: 27 mmol/L (ref 22–32)
Chloride: 100 mmol/L — ABNORMAL LOW (ref 101–111)
Creatinine, Ser: 0.72 mg/dL (ref 0.44–1.00)
GFR calc Af Amer: >60 mL/min (ref >60)
GFR calc non Af Amer: >60 mL/min (ref >60)
GLUCOSE: 271 mg/dL — AB (ref 65–99)
Potassium: 3.6 mmol/L (ref 3.5–5.1)
SODIUM: 137 mmol/L (ref 135–145)

## 2017-09-27 LAB — GLUCOSE, CAPILLARY
GLUCOSE-CAPILLARY: 243 mg/dL — AB (ref 65–99)
Glucose-Capillary: 250 mg/dL — ABNORMAL HIGH (ref 65–99)

## 2017-09-27 MED ORDER — "INSULIN SYRINGE 31G X 5/16"" 0.5 ML MISC": 0 refills | Status: AC

## 2017-09-27 MED ORDER — ATENOLOL 25 MG PO TABS
12.5000 mg | ORAL_TABLET | Freq: Once | ORAL | Status: AC
  Administered 2017-09-27: 12.5 mg via ORAL
  Filled 2017-09-27: qty 1

## 2017-09-27 MED ORDER — INSULIN GLARGINE 100 UNIT/ML ~~LOC~~ SOLN: 24.0000 [IU] | Freq: Every day | SUBCUTANEOUS | 11 refills | Status: DC

## 2017-09-27 MED ORDER — BLOOD GLUCOSE METER KIT: PACK | 0 refills | Status: AC

## 2017-09-27 MED ORDER — METFORMIN HCL 500 MG PO TABS: 500.0000 mg | ORAL_TABLET | Freq: Two times a day (BID) | ORAL | 3 refills | Status: DC

## 2017-09-27 MED ORDER — HYDROCHLOROTHIAZIDE 12.5 MG PO CAPS: 12.5000 mg | ORAL_CAPSULE | Freq: Every day | ORAL | 2 refills | Status: DC

## 2017-09-27 MED ORDER — LISINOPRIL 20 MG PO TABS
20.0000 mg | ORAL_TABLET | Freq: Every day | ORAL | Status: DC
  Administered 2017-09-27: 20 mg via ORAL
  Filled 2017-09-27: qty 1

## 2017-09-27 NOTE — Discharge Summary (Signed)
Physician Discharge Summary  [name] [id number]:[id number] DOB: 16-Feb-1941 DOA: 09/25/2017  PCP: Dorise Hiss, PA-C  Admit date: 09/25/2017 Discharge date: 09/27/2017  Time spent: 35* minutes  Recommendations for Outpatient Follow-up:  1. Follow up PCP in  2 weeks   Discharge Diagnoses:  Active Problems:   Hyperglycemia   Discharge Condition: Stable  Diet recommendation: carb modified diet   History of present illness:  76 y.o.female with history of non-insulin-dependent type 2 diabetes,hypertension, history of mood disorder. She is a very Poor historian , conversation focused on reported h/o chronic fatigue syndrome.  HPI obtained from chart review, she was instructed by her primary care doctor to come to the ED for evaluation due to poorly controlled diabetes and medication noncompliance issue. She waited 3 hours in the ED yesterday and left room left to ED.then decided to come back to the ED for further evaluation due to progressive fatigue and weakness  Hospital Course:  1. Uncontrolled diabetes mellitus-patient's hemoglobin A1c is 14.  Blood glucose is better now.  Will increase Lantus to 24 units subcu daily.  Patient will be continued on Metformin 500 mg po bid, increase to 1000 mg po bid. 2. Hyponatremia-patient presented with hyponatremia, with sodium 130.  Likely pseudohyponatremia from hyperglycemia.  Currently it has resolved. Repeat serum sodium is 138. 3. Chronic kidney disease stage III-creatinine has improved, today creatinine 0.80.   4.  Hypertension-blood pressure is elevated.  Continue atenolol .  Patient has multiple allergies listed.  Will cut down the dose of  hydrochlorothiazide 12.5 mg p.o. Daily 5. History of mood disorder-continue Adderall  Procedures:  None   Consultations:  None   Discharge Exam: Vitals:   09/26/17 2100 09/27/17 0538  BP: (!) 190/87 (!) 190/79  Pulse: 87 80  Resp: 16 16  Temp: 98.6 F (37 C) 97.7 F (36.5  C)  SpO2: 96% 94%    General: Appears in no acute distress Cardiovascular: S1S2 RRR Respiratory: Clear bilaterally  Discharge Instructions   Discharge Instructions    Diet - low sodium heart healthy    Complete by:  As directed    Increase activity slowly    Complete by:  As directed      Current Discharge Medication List    START taking these medications   Details  blood glucose meter kit and supplies Dispense based on patient and insurance preference. Use up to four times daily as directed. (FOR ICD-9 250.00, 250.01). Qty: 1 each, Refills: 0    hydrochlorothiazide (MICROZIDE) 12.5 MG capsule Take 1 capsule (12.5 mg total) by mouth daily. Qty: 30 capsule, Refills: 2    insulin glargine (LANTUS) 100 UNIT/ML injection Inject 0.24 mLs (24 Units total) into the skin at bedtime. Qty: 10 mL, Refills: 11    Insulin Syringe-Needle U-100 (INSULIN SYRINGE .5CC/31GX5/16") 31G X 5/16" 0.5 ML MISC Use as directed Qty: 100 each, Refills: 0      CONTINUE these medications which have CHANGED   Details  metFORMIN (GLUCOPHAGE) 500 MG tablet Take 1 tablet (500 mg total) by mouth 2 (two) times daily with a meal. Wk1: 1/2 tab 2x/day; Wk2: 1 tab am, 1/2 tab pm; Wk3: 2 tabs 2x/day. Qty: 180 tablet, Refills: 3   Associated Diagnoses: Type 2 diabetes mellitus with complication, without long-term current use of insulin (HCC)      CONTINUE these medications which have NOT CHANGED   Details  Alfalfa 250 MG TABS Take 1 tablet by mouth every morning.    amphetamine-dextroamphetamine (  ADDERALL) 20 MG tablet Take 1 tablet (20 mg total) by mouth 2 (two) times daily. Qty: 60 tablet, Refills: 0   Associated Diagnoses: Mood disorder in conditions classified elsewhere    aspirin 81 MG tablet Take 81 mg by mouth daily.    atenolol (TENORMIN) 25 MG tablet Take 1 tab by mouth in the morning and 2 tab in the evening. Please schedule appointment for refills. Qty: 270 tablet, Refills: 0   Associated  Diagnoses: Essential hypertension    Cholecalciferol (VITAMIN D3) 5000 units TBDP Take 1 capsule by mouth daily.    CHROMIUM ASPARTATE PO Take 1 tablet by mouth every morning.    Cinnamon Bark POWD Take 1 Dose by mouth daily.    DHA-EPA-Coenzyme Q10-Vitamin E (GNP COQ-10 & FISH OIL) 120-180-50-30 CAPS Take 1 tablet by mouth daily.    fexofenadine (ALLEGRA) 180 MG tablet Take 180 mg by mouth daily as needed.     FLUoxetine (PROZAC) 20 MG capsule Take 1 capsule (20 mg total) by mouth 2 (two) times daily. Qty: 60 capsule, Refills: 0   Associated Diagnoses: Depression, unspecified depression type    HYDROcodone-acetaminophen (NORCO) 10-325 MG per tablet Take 1-2 tablets by mouth every 4 (four) hours as needed for moderate pain.     levothyroxine (SYNTHROID, LEVOTHROID) 50 MCG tablet Take 1 tablet (50 mcg total) by mouth daily before breakfast. Qty: 30 tablet, Refills: 5    lisinopril (PRINIVIL,ZESTRIL) 20 MG tablet TAKE 1 TABLET BY MOUTH IN THE MORNING AND 1/2 (ONE-HALF) AT BEDTIME Qty: 135 tablet, Refills: 0   Associated Diagnoses: Essential hypertension    magnesium oxide (MAG-OX) 400 MG tablet Take 400 mg by mouth 2 (two) times daily.    potassium chloride (K-DUR,KLOR-CON) 10 MEQ tablet TAKE 1 TAB (10 MEQ TOTAL) BY MOUTH AS NEEDED (ONLY TAKES IF SHE DOES NOT EAT POTASSIUM RICH FOODS) Qty: 30 tablet, Refills: 0   Associated Diagnoses: Encounter for medication management    Misc. Devices (HUGO ROLLING WALKER ELITE) MISC 1 Units by Does not apply route daily as needed. Qty: 1 each, Refills: 0   Associated Diagnoses: History of fall      STOP taking these medications     hydrochlorothiazide (HYDRODIURIL) 25 MG tablet      ibuprofen (ADVIL,MOTRIN) 800 MG tablet        Allergies  Allergen Reactions  . Cardizem [Diltiazem Hcl]     nausea  . Codeine     Feeling weird   . Coreg [Carvedilol]   . Demerol [Meperidine]     unknown  . Erythromycin     Very sick  . Inderal  [Propranolol]     Made me cry  . Lisinopril Cough  . Procardia [Nifedipine]   . Wellbutrin [Bupropion]   . Zoloft [Sertraline Hcl]     Could not talk      The results of significant diagnostics from this hospitalization (including imaging, microbiology, ancillary and laboratory) are listed below for reference.    Significant Diagnostic Studies: Ct Head Wo Contrast  Result Date: 09/25/2017 CLINICAL DATA:  Confusion and nausea. EXAM: CT HEAD WITHOUT CONTRAST TECHNIQUE: Contiguous axial images were obtained from the base of the skull through the vertex without intravenous contrast. COMPARISON:  May 13, 2017 FINDINGS: Brain: Mild to moderate diffuse atrophy is stable. There is no intracranial mass, hemorrhage, extra-axial fluid collection, or midline shift. There is small vessel disease throughout much of the centra semiovale bilaterally, stable. No new gray-white compartment lesion evident. No acute infarct  is appreciable. Vascular: There is no evident hyperdense vessel. There is calcification in each carotid siphon region as well as in each distal vertebral artery. Skull: Bony calvarium appears intact. Sinuses/Orbits: There is opacification in the right sphenoid sinus with an air-fluid level in this area. Other visualized paranasal sinuses are clear. Visualized orbits appear symmetric bilaterally. Other: Mastoid air cells are clear. IMPRESSION: 1. Stable atrophy with supratentorial small vessel disease. No intracranial mass, hemorrhage, or extra-axial fluid collection. No evident acute infarct. 2.  Multiple foci of arterial vascular calcification noted. 3.  Opacification with air-fluid level in the right sphenoid sinus. Electronically Signed   By: Lowella Grip III M.D.   On: 09/25/2017 16:29    Microbiology: No results found for this or any previous visit (from the past 240 hour(s)).   Labs: Basic Metabolic Panel:  Recent Labs Lab 09/23/17 1803 09/25/17 1240 09/26/17 0606  09/27/17 0659  NA 130* 130* 138 137  K 4.3 3.4* 4.6 3.6  CL 87* 91* 105 100*  CO2 _0 GLUCOSE 551* 442* 282* 271*  BUN 22 30* 22* 15  CREATININE 1.16* 1.08* 0.80 0.72  CALCIUM 9.7 9.3 9.2 9.3  MG  --   --  1.8  --    Liver Function Tests:  Recent Labs Lab 09/23/17 1803  AST 17  ALT 21  ALKPHOS 119*  BILITOT 0.3  PROT 6.7  ALBUMIN 4.0   No results for input(s): LIPASE, AMYLASE in the last 168 hours. No results for input(s): AMMONIA in the last 168 hours. CBC:  Recent Labs Lab 09/25/17 1240 09/26/17 0606  WBC 4.5 4.6  HGB 12.9 11.6*  HCT 37.9 35.4*  MCV 82.6 83.9  PLT 245 224   Cardiac Enzymes: No results for input(s): CKTOTAL, CKMB, CKMBINDEX, TROPONINI in the last 168 hours. BNP: BNP (last 3 results) No results for input(s): BNP in the last 8760 hours.  ProBNP (last 3 results) No results for input(s): PROBNP in the last 8760 hours.  CBG:  Recent Labs Lab 09/26/17 1257 09/26/17 1650 09/26/17 2230 09/27/17 0744 09/27/17 1118  GLUCAP 213* 261* 317* 243* 250*       Signed:  Oswald Hillock MD.  [facility] 09/27/2017, 11:34 AM

## 2017-09-27 NOTE — Care Management Note (Signed)
Case Management Note  Patient Details  Name: [name] MRN: [id number] Date of Birth: 1941-04-07  Subjective/Objective:                 Spoke w patient, discussed HH options, [facility] made to [facility]. No other CM needs identified. CM signing off.    Action/Plan:   Expected Discharge Date:  09/27/17               Expected Discharge Plan:  Home w Home Health Services  In-House Referral:     Discharge planning Services  CM Consult  Post Acute Care Choice:    Choice offered to:     DME Arranged:    DME Agency:     HH Arranged:  RN HH Agency:  [facility]  Status of Service:  Completed, signed off  If discussed at Long Length of Stay Meetings, dates discussed:    Additional Comments:  Lawerance SabalDebbie Hoyle Barkdull, RN 09/27/2017, 1:13 PM

## 2017-09-27 NOTE — Progress Notes (Signed)
Education given for insulin injections.

## 2017-09-27 NOTE — Progress Notes (Signed)
Pt educated at bedside on use of sliding scale for insulin administration; demonstration with teach back on self administration of insulin; pt verbalizes understanding; needs some additional reinforcement

## 2017-09-27 NOTE — Progress Notes (Signed)
Pt leaving this afternoon with her husband. Alert and oriented. No c/o. Education for diabetes control gone over with pt multiple times, as well as teaching how to give injections and check blood sugar. [facility] CM has set this up. Discharge instructions/prescriptions given/explained with pt verbalizing understanding. Made pt aware of followup appts needed and the importance of.

## 2017-09-29 ENCOUNTER — Telehealth: Payer: Self-pay

## 2017-09-29 NOTE — Telephone Encounter (Signed)
Transition Care Management Follow-up Telephone Call   Date discharged?09/27/17   How have you been since you were released from the hospital? Patient states that she is feeling a lot better.   Do you understand why you were in the hospital? yes   Do you understand the discharge instructions? yes   Where were you discharged to? Home   Items Reviewed:  Medications reviewed: yes  Allergies reviewed: yes  Dietary changes reviewed: yes  Referrals reviewed: yes   Functional Questionnaire:   Activities of Daily Living (ADLs):   She states they are independent in the following: ambulation, feeding, continence and toileting States they require assistance with the following: bathing and hygiene, grooming and dressing   Any transportation issues/concerns?: no   Any patient concerns? Yes, Patient needs help with understanding how to take her diabetic medication.   Confirmed importance and date/time of follow-up visits scheduled yes  Provider Appointment booked with Hulen ShoutsElizabeth McVey, PA on 09/30/17 @ 4:40 pm.  Confirmed with patient if condition begins to worsen call PCP or go to the ER.  Patient was given the office number and encouraged to call back with question or concerns.  : yes

## 2017-09-30 ENCOUNTER — Ambulatory Visit: Payer: Self-pay | Admitting: Physician Assistant

## 2017-09-30 DIAGNOSIS — Z7982 Long term (current) use of aspirin: Secondary | ICD-10-CM | POA: Diagnosis not present

## 2017-09-30 DIAGNOSIS — R296 Repeated falls: Secondary | ICD-10-CM | POA: Diagnosis not present

## 2017-09-30 DIAGNOSIS — G894 Chronic pain syndrome: Secondary | ICD-10-CM | POA: Diagnosis not present

## 2017-09-30 DIAGNOSIS — Z794 Long term (current) use of insulin: Secondary | ICD-10-CM | POA: Diagnosis not present

## 2017-09-30 DIAGNOSIS — I129 Hypertensive chronic kidney disease with stage 1 through stage 4 chronic kidney disease, or unspecified chronic kidney disease: Secondary | ICD-10-CM | POA: Diagnosis not present

## 2017-09-30 DIAGNOSIS — E1165 Type 2 diabetes mellitus with hyperglycemia: Secondary | ICD-10-CM | POA: Diagnosis not present

## 2017-09-30 DIAGNOSIS — F39 Unspecified mood [affective] disorder: Secondary | ICD-10-CM | POA: Diagnosis not present

## 2017-09-30 DIAGNOSIS — E1122 Type 2 diabetes mellitus with diabetic chronic kidney disease: Secondary | ICD-10-CM | POA: Diagnosis not present

## 2017-09-30 DIAGNOSIS — Z955 Presence of coronary angioplasty implant and graft: Secondary | ICD-10-CM | POA: Diagnosis not present

## 2017-09-30 DIAGNOSIS — E669 Obesity, unspecified: Secondary | ICD-10-CM | POA: Diagnosis not present

## 2017-09-30 DIAGNOSIS — N183 Chronic kidney disease, stage 3 (moderate): Secondary | ICD-10-CM | POA: Diagnosis not present

## 2017-09-30 DIAGNOSIS — M797 Fibromyalgia: Secondary | ICD-10-CM | POA: Diagnosis not present

## 2017-09-30 DIAGNOSIS — I2510 Atherosclerotic heart disease of native coronary artery without angina pectoris: Secondary | ICD-10-CM | POA: Diagnosis not present

## 2017-09-30 NOTE — Telephone Encounter (Unsigned)
Copied from CRM [phone]. Topic: Inquiry >> Sep 30, 2017 10:57 AM Windy KalataMichael, Taylor L, NT wrote: Reason for CRM: Jeanice LimHolly the nurse with home care wanted to let MD know she is going to be ordering all the works for the patient, Pt, Ot, etc. Everything she can get out there.

## 2017-10-01 ENCOUNTER — Encounter: Payer: Self-pay | Admitting: Physician Assistant

## 2017-10-01 ENCOUNTER — Ambulatory Visit (INDEPENDENT_AMBULATORY_CARE_PROVIDER_SITE_OTHER): Payer: Medicare Other | Admitting: Physician Assistant

## 2017-10-01 ENCOUNTER — Other Ambulatory Visit: Payer: Self-pay

## 2017-10-01 VITALS — BP 114/60 | HR 72 | Temp 97.9°F | Resp 16 | Ht 63.0 in | Wt 166.4 lb

## 2017-10-01 DIAGNOSIS — E1165 Type 2 diabetes mellitus with hyperglycemia: Secondary | ICD-10-CM | POA: Diagnosis not present

## 2017-10-01 DIAGNOSIS — Z9181 History of falling: Secondary | ICD-10-CM | POA: Diagnosis not present

## 2017-10-01 DIAGNOSIS — Z09 Encounter for follow-up examination after completed treatment for conditions other than malignant neoplasm: Secondary | ICD-10-CM

## 2017-10-01 DIAGNOSIS — E118 Type 2 diabetes mellitus with unspecified complications: Secondary | ICD-10-CM | POA: Diagnosis not present

## 2017-10-01 DIAGNOSIS — E871 Hypo-osmolality and hyponatremia: Secondary | ICD-10-CM | POA: Diagnosis not present

## 2017-10-01 DIAGNOSIS — B379 Candidiasis, unspecified: Secondary | ICD-10-CM | POA: Diagnosis not present

## 2017-10-01 LAB — POCT GLYCOSYLATED HEMOGLOBIN (HGB A1C): Hemoglobin A1C: 14

## 2017-10-01 LAB — GLUCOSE, POCT (MANUAL RESULT ENTRY): POC Glucose: 250 mg/dl — AB (ref 70–99)

## 2017-10-01 MED ORDER — FLUCONAZOLE 150 MG PO TABS: 150.0000 mg | ORAL_TABLET | Freq: Once | ORAL | 0 refills | Status: AC

## 2017-10-01 NOTE — Progress Notes (Signed)
[name]  MRN: [id number] DOB: 11-14-1941  PCP: Dorise Hiss, PA-C  Subjective:  Pt is a 76 year old female who presents to clinic for follow-up s/p hospital admission.   DM discharge instructions -" Will increase Lantus to 24 units subcu daily. Patient will be continued on Metformin 500 mg po bid, increase to 1000 mg po bid." she feels confident about taking medications.  She is feeling much better.   Chronic kidney disease stage III- creatinine improved, 0.80 at discharge.  Hyponatremia - patient presented with hyponatremia, with sodium 130. Likely pseudohyponatremia from hyperglycemia. Currently it has resolved. Repeat serum sodium is 138.  HTN - Continue atenolol . Patient has multiple allergies listed. Will cut down the dose of  hydrochlorothiazide 12.5 mg p.o. Daily  Nutritionist is coming to her home soon.  Speech therapist is coming tomorrow.  She is going to [employer] [facility] tomorrow for knee injections.  Home health care is coming on a regular basis to help bathe.  She plans to see physical therapy.   Review of Systems  Constitutional: Negative for chills, diaphoresis, fatigue and fever.  HENT: Negative for congestion, postnasal drip, rhinorrhea, sinus pressure, sneezing and sore throat.   Respiratory: Negative for cough, chest tightness, shortness of breath and wheezing.   Cardiovascular: Negative for chest pain and palpitations.  Gastrointestinal: Negative for abdominal pain, diarrhea, nausea and vomiting.  Genitourinary: Positive for vaginal pain (itching).  Neurological: Negative for weakness, light-headedness and headaches.    Patient Active Problem List   Diagnosis Date Noted  . Hyperglycemia 09/25/2017  . Type 2 diabetes mellitus with complication, without long-term current use of insulin ([place]) 04/24/2017  . ADD (attention deficit disorder) 12/30/2016  . Narcotic addiction ([place]) 12/05/2014  . Excessive daytime sleepiness 12/05/2014  .  Alteration in psychomotor activity 12/05/2014  . Depression with somatization 12/05/2014  . Hypersomnia, persistent 09/10/2013  . Obesity, unspecified 09/10/2013  . Chronic pain syndrome 09/10/2013  . Depression 07/31/2013  . Medication intolerance 07/19/2013  . Anxiety 07/19/2013  . CAD (coronary artery disease) status post stent to distal right coronary artery 2006 06/20/2013  . Neurocardiogenic syncope 06/20/2013  . Hyperlipidemia 06/20/2013    Current Outpatient Medications on File Prior to Visit  Medication Sig Dispense Refill  . Alfalfa 250 MG TABS Take 1 tablet by mouth every morning.    Marland Kitchen amphetamine-dextroamphetamine (ADDERALL) 20 MG tablet Take 1 tablet (20 mg total) by mouth 2 (two) times daily. (Patient taking differently: Take 20 mg by mouth 2 (two) times daily as needed. ) 60 tablet 0  . aspirin 81 MG tablet Take 81 mg by mouth daily.    Marland Kitchen atenolol (TENORMIN) 25 MG tablet Take 1 tab by mouth in the morning and 2 tab in the evening. Please schedule appointment for refills. 270 tablet 0  . blood glucose meter kit and supplies Dispense based on patient and insurance preference. Use up to four times daily as directed. (FOR ICD-9 250.00, 250.01). 1 each 0  . Cholecalciferol (VITAMIN D3) 5000 units TBDP Take 1 capsule by mouth daily.    . CHROMIUM ASPARTATE PO Take 1 tablet by mouth every morning.    Verneita Griffes Bark POWD Take 1 Dose by mouth daily.    . DHA-EPA-Coenzyme Q10-Vitamin E (GNP COQ-10 & FISH OIL) 120-180-50-30 CAPS Take 1 tablet by mouth daily.    . fexofenadine (ALLEGRA) 180 MG tablet Take 180 mg by mouth daily as needed.     . hydrochlorothiazide (MICROZIDE) 12.5 MG  capsule Take 1 capsule (12.5 mg total) by mouth daily. 30 capsule 2  . HYDROcodone-acetaminophen (NORCO) 10-325 MG per tablet Take 1-2 tablets by mouth every 4 (four) hours as needed for moderate pain.     Marland Kitchen insulin glargine (LANTUS) 100 UNIT/ML injection Inject 0.24 mLs (24 Units total) into the skin at  bedtime. 10 mL 11  . Insulin Syringe-Needle U-100 (INSULIN SYRINGE .5CC/31GX5/16") 31G X 5/16" 0.5 ML MISC Use as directed 100 each 0  . levothyroxine (SYNTHROID, LEVOTHROID) 50 MCG tablet Take 1 tablet (50 mcg total) by mouth daily before breakfast. 30 tablet 5  . lisinopril (PRINIVIL,ZESTRIL) 20 MG tablet TAKE 1 TABLET BY MOUTH IN THE MORNING AND 1/2 (ONE-HALF) AT BEDTIME 135 tablet 0  . magnesium oxide (MAG-OX) 400 MG tablet Take 400 mg by mouth 2 (two) times daily.    . metFORMIN (GLUCOPHAGE) 500 MG tablet Take 1 tablet (500 mg total) by mouth 2 (two) times daily with a meal. Wk1: 1/2 tab 2x/day; Wk2: 1 tab am, 1/2 tab pm; Wk3: 2 tabs 2x/day. 180 tablet 3  . Misc. Devices (HUGO ROLLING WALKER ELITE) MISC 1 Units by Does not apply route daily as needed. 1 each 0  . potassium chloride (K-DUR,KLOR-CON) 10 MEQ tablet TAKE 1 TAB (10 MEQ TOTAL) BY MOUTH AS NEEDED (ONLY TAKES IF SHE DOES NOT EAT POTASSIUM RICH FOODS) 30 tablet 0  . FLUoxetine (PROZAC) 20 MG capsule Take 1 capsule (20 mg total) by mouth 2 (two) times daily. 60 capsule 0   No current facility-administered medications on file prior to visit.     Allergies  Allergen Reactions  . Cardizem [Diltiazem Hcl]     nausea  . Codeine     Feeling weird   . Coreg [Carvedilol]   . Demerol [Meperidine]     unknown  . Erythromycin     Very sick  . Inderal [Propranolol]     Made me cry  . Lisinopril Cough  . Procardia [Nifedipine]   . Wellbutrin [Bupropion]   . Zoloft [Sertraline Hcl]     Could not talk     Objective:  BP 114/60   Pulse 72   Temp 97.9 F (36.6 C) (Oral)   Resp 16   Ht '5\' 3"'  (1.6 m) Comment: per pt  Wt 166 lb 6.4 oz (75.5 kg)   SpO2 92%   BMI 29.48 kg/m   Physical Exam  Constitutional: She is oriented to person, place, and time and well-developed, well-nourished, and in no distress. No distress.  Cardiovascular: Normal rate, regular rhythm and normal heart sounds.  Abdominal: Soft. There is no tenderness.    Neurological: She is alert and oriented to person, place, and time. GCS score is 15.  Skin: Skin is warm and dry.  Psychiatric: Mood, memory, affect and judgment normal.  Vitals reviewed.   Results for orders placed or performed in visit on 10/01/17  POCT glucose (manual entry)  Result Value Ref Range   POC Glucose 250 (A) 70 - 99 mg/dl  POCT glycosylated hemoglobin (Hb A1C)  Result Value Ref Range   Hemoglobin A1C 14.0     Assessment and Plan :  1. Hospital discharge follow-up 2. History of falling - Fabiha will soon have home care following up in the next few days/week: Nutritionist, speech therapist, home health to help bathe, physical therapy. She is feeling very confident about getting her health under control.   3. Type 2 diabetes mellitus with complication, unspecified whether long term insulin  use ([place]) 4. Hyponatremia - POCT glucose (manual entry) - CMP14+EGFR - POCT glycosylated hemoglobin (Hb A1C) - Hyponatremia upon hospital admission - Likely pseudohyponatremia from hyperglycemia. Was resolved at hospital discharge, will follow.  RTC in 4 weeks for blood sugar recheck. She understands and agrees.  5. Yeast infection - fluconazole (DIFLUCAN) 150 MG tablet; Take 1 tablet (150 mg total) once for 1 dose by mouth. Repeat if needed  Dispense: 2 tablet; Refill: 0  Mercer Pod, PA-C  [facility] 10/01/2017 5:41 PM

## 2017-10-01 NOTE — Patient Instructions (Addendum)
Be sure you are keeping up with your medication changes since your hospital visit! Lantus to 24 units subcu daily. Continue on Metformin 500 mg po bid, increase to 1,000 mg po bid.  Come back and see me in 1 month.    Diabetes Mellitus and Food It is important for you to manage your blood sugar (glucose) level. Your blood glucose level can be greatly affected by what you eat. Eating healthier foods in the appropriate amounts throughout the day at about the same time each day will help you control your blood glucose level. It can also help slow or prevent worsening of your diabetes mellitus. Healthy eating may even help you improve the level of your blood pressure and reach or maintain a healthy weight. General recommendations for healthful eating and cooking habits include:  Eating meals and snacks regularly. Avoid going long periods of time without eating to lose weight.  Eating a diet that consists mainly of plant-based foods, such as fruits, vegetables, nuts, legumes, and whole grains.  Using low-heat cooking methods, such as baking, instead of high-heat cooking methods, such as deep frying.  Work with your dietitian to make sure you understand how to use the Nutrition Facts information on food labels. How can food affect me? Carbohydrates Carbohydrates affect your blood glucose level more than any other type of food. Your dietitian will help you determine how many carbohydrates to eat at each meal and teach you how to count carbohydrates. Counting carbohydrates is important to keep your blood glucose at a healthy level, especially if you are using insulin or taking certain medicines for diabetes mellitus. Alcohol Alcohol can cause sudden decreases in blood glucose (hypoglycemia), especially if you use insulin or take certain medicines for diabetes mellitus. Hypoglycemia can be a life-threatening condition. Symptoms of hypoglycemia (sleepiness, dizziness, and disorientation) are similar to  symptoms of having too much alcohol. If your health care provider has given you approval to drink alcohol, do so in moderation and use the following guidelines:  Women should not have more than one drink per day, and men should not have more than two drinks per day. One drink is equal to: ? 12 oz of beer. ? 5 oz of wine. ? 1 oz of hard liquor.  Do not drink on an empty stomach.  Keep yourself hydrated. Have water, diet soda, or unsweetened iced tea.  Regular soda, juice, and other mixers might contain a lot of carbohydrates and should be counted.  What foods are not recommended? As you make food choices, it is important to remember that all foods are not the same. Some foods have fewer nutrients per serving than other foods, even though they might have the same number of calories or carbohydrates. It is difficult to get your body what it needs when you eat foods with fewer nutrients. Examples of foods that you should avoid that are high in calories and carbohydrates but low in nutrients include:  Trans fats (most processed foods list trans fats on the Nutrition Facts label).  Regular soda.  Juice.  Candy.  Sweets, such as cake, pie, doughnuts, and cookies.  Fried foods.  What foods can I eat? Eat nutrient-rich foods, which will nourish your body and keep you healthy. The food you should eat also will depend on several factors, including:  The calories you need.  The medicines you take.  Your weight.  Your blood glucose level.  Your blood pressure level.  Your cholesterol level.  You should eat a  variety of foods, including:  Protein. ? Lean cuts of meat. ? Proteins low in saturated fats, such as fish, egg whites, and beans. Avoid processed meats.  Fruits and vegetables. ? Fruits and vegetables that may help control blood glucose levels, such as apples, mangoes, and yams.  Dairy products. ? Choose fat-free or low-fat dairy products, such as milk, yogurt, and  cheese.  Grains, bread, pasta, and rice. ? Choose whole grain products, such as multigrain bread, whole oats, and brown rice. These foods may help control blood pressure.  Fats. ? Foods containing healthful fats, such as nuts, avocado, olive oil, canola oil, and fish.  Does everyone with diabetes mellitus have the same meal plan? Because every person with diabetes mellitus is different, there is not one meal plan that works for everyone. It is very important that you meet with a dietitian who will help you create a meal plan that is just right for you. This information is not intended to replace advice given to you by your health care provider. Make sure you discuss any questions you have with your health care provider. Document Released: 08/08/2005 Document Revised: 04/18/2016 Document Reviewed: 10/08/2013 Elsevier Interactive Patient Education  2017 [employer]   IF you received an x-ray today, you will receive an invoice from [facility]. Please contact [facility] at [phone] with questions or concerns regarding your invoice.   IF you received labwork today, you will receive an invoice from [facility]. Please contact [facility] at [phone] with questions or concerns regarding your invoice.   Our billing staff will not be able to assist you with questions regarding bills from these companies.  You will be contacted with the lab results as soon as they are available. The fastest way to get your results is to activate your My Chart account. Instructions are located on the last page of this paperwork. If you have not heard from us regarding the results in 2 weeks, please contact this office.

## 2017-10-02 ENCOUNTER — Telehealth: Payer: Self-pay | Admitting: Family Medicine

## 2017-10-02 DIAGNOSIS — M170 Bilateral primary osteoarthritis of knee: Secondary | ICD-10-CM | POA: Diagnosis not present

## 2017-10-02 DIAGNOSIS — I129 Hypertensive chronic kidney disease with stage 1 through stage 4 chronic kidney disease, or unspecified chronic kidney disease: Secondary | ICD-10-CM | POA: Diagnosis not present

## 2017-10-02 DIAGNOSIS — N183 Chronic kidney disease, stage 3 (moderate): Secondary | ICD-10-CM | POA: Diagnosis not present

## 2017-10-02 DIAGNOSIS — E1165 Type 2 diabetes mellitus with hyperglycemia: Secondary | ICD-10-CM | POA: Diagnosis not present

## 2017-10-02 DIAGNOSIS — M25561 Pain in right knee: Secondary | ICD-10-CM | POA: Diagnosis not present

## 2017-10-02 DIAGNOSIS — I2510 Atherosclerotic heart disease of native coronary artery without angina pectoris: Secondary | ICD-10-CM | POA: Diagnosis not present

## 2017-10-02 DIAGNOSIS — G894 Chronic pain syndrome: Secondary | ICD-10-CM | POA: Diagnosis not present

## 2017-10-02 DIAGNOSIS — M25562 Pain in left knee: Secondary | ICD-10-CM | POA: Diagnosis not present

## 2017-10-02 DIAGNOSIS — E1122 Type 2 diabetes mellitus with diabetic chronic kidney disease: Secondary | ICD-10-CM | POA: Diagnosis not present

## 2017-10-02 LAB — CMP14+EGFR
ALT: 31 IU/L (ref 0–32)
AST: 29 IU/L (ref 0–40)
Albumin/Globulin Ratio: 1.6 (ref 1.2–2.2)
Albumin: 4.1 g/dL (ref 3.5–4.8)
Alkaline Phosphatase: 111 IU/L (ref 39–117)
BUN/Creatinine Ratio: 24 (ref 12–28)
BUN: 26 mg/dL (ref 8–27)
Bilirubin Total: 0.3 mg/dL (ref 0.0–1.2)
CO2: 22 mmol/L (ref 20–29)
Calcium: 9.8 mg/dL (ref 8.7–10.3)
Chloride: 96 mmol/L (ref 96–106)
Creatinine, Ser: 1.1 mg/dL — ABNORMAL HIGH (ref 0.57–1.00)
GFR calc Af Amer: 56 mL/min/{1.73_m2} — ABNORMAL LOW (ref >59)
GFR calc non Af Amer: 49 mL/min/{1.73_m2} — ABNORMAL LOW (ref >59)
Globulin, Total: 2.5 g/dL (ref 1.5–4.5)
Glucose: 268 mg/dL — ABNORMAL HIGH (ref 65–99)
Potassium: 3.9 mmol/L (ref 3.5–5.2)
Sodium: 135 mmol/L (ref 134–144)
Total Protein: 6.6 g/dL (ref 6.0–8.5)

## 2017-10-02 NOTE — Telephone Encounter (Signed)
Copied from CRM [id number]. Topic: Quick Communication - See Telephone Encounter >> Oct 02, 2017  3:13 PM Guinevere FerrariMorris, Malek Skog E, NT wrote: CRM for notification. See Telephone encounter for:  Boneta LucksJenny from [facility] calling about getting verbal orders for Speech Therapy. Orders are to see patient twice the first week and once for the second week. Boneta LucksJenny would like a call back  10/02/17.

## 2017-10-03 ENCOUNTER — Other Ambulatory Visit: Payer: Self-pay | Admitting: Physician Assistant

## 2017-10-03 DIAGNOSIS — I129 Hypertensive chronic kidney disease with stage 1 through stage 4 chronic kidney disease, or unspecified chronic kidney disease: Secondary | ICD-10-CM | POA: Diagnosis not present

## 2017-10-03 DIAGNOSIS — I2510 Atherosclerotic heart disease of native coronary artery without angina pectoris: Secondary | ICD-10-CM | POA: Diagnosis not present

## 2017-10-03 DIAGNOSIS — G894 Chronic pain syndrome: Secondary | ICD-10-CM | POA: Diagnosis not present

## 2017-10-03 DIAGNOSIS — F329 Major depressive disorder, single episode, unspecified: Secondary | ICD-10-CM

## 2017-10-03 DIAGNOSIS — N183 Chronic kidney disease, stage 3 (moderate): Secondary | ICD-10-CM | POA: Diagnosis not present

## 2017-10-03 DIAGNOSIS — E1122 Type 2 diabetes mellitus with diabetic chronic kidney disease: Secondary | ICD-10-CM | POA: Diagnosis not present

## 2017-10-03 DIAGNOSIS — E1165 Type 2 diabetes mellitus with hyperglycemia: Secondary | ICD-10-CM | POA: Diagnosis not present

## 2017-10-03 DIAGNOSIS — F32A Depression, unspecified: Secondary | ICD-10-CM

## 2017-10-06 ENCOUNTER — Telehealth: Payer: Self-pay | Admitting: Family Medicine

## 2017-10-06 NOTE — Telephone Encounter (Signed)
Please see below for request re speech therapy.

## 2017-10-06 NOTE — Telephone Encounter (Signed)
Copied from CRM [id number]. Topic: General - Other >> Oct 06, 2017  9:38 AM Cecelia ByarsGreen, Deiontae Rabel L, RMA wrote: Reason for CRM: Lilia ProCindy Mayer called from [facility] needs a verbal orders from Dr. Katrinka BlazingSmith for social work visit for 2 visits please return call to Hersheyindy at [phone]

## 2017-10-07 ENCOUNTER — Other Ambulatory Visit: Payer: Self-pay | Admitting: Physician Assistant

## 2017-10-07 DIAGNOSIS — N183 Chronic kidney disease, stage 3 (moderate): Secondary | ICD-10-CM | POA: Diagnosis not present

## 2017-10-07 DIAGNOSIS — I129 Hypertensive chronic kidney disease with stage 1 through stage 4 chronic kidney disease, or unspecified chronic kidney disease: Secondary | ICD-10-CM | POA: Diagnosis not present

## 2017-10-07 DIAGNOSIS — I10 Essential (primary) hypertension: Secondary | ICD-10-CM

## 2017-10-07 DIAGNOSIS — I2510 Atherosclerotic heart disease of native coronary artery without angina pectoris: Secondary | ICD-10-CM | POA: Diagnosis not present

## 2017-10-07 DIAGNOSIS — G894 Chronic pain syndrome: Secondary | ICD-10-CM | POA: Diagnosis not present

## 2017-10-07 DIAGNOSIS — E1122 Type 2 diabetes mellitus with diabetic chronic kidney disease: Secondary | ICD-10-CM | POA: Diagnosis not present

## 2017-10-07 DIAGNOSIS — E1165 Type 2 diabetes mellitus with hyperglycemia: Secondary | ICD-10-CM | POA: Diagnosis not present

## 2017-10-07 NOTE — Telephone Encounter (Signed)
Verbal given 

## 2017-10-08 DIAGNOSIS — G894 Chronic pain syndrome: Secondary | ICD-10-CM | POA: Diagnosis not present

## 2017-10-08 DIAGNOSIS — E1165 Type 2 diabetes mellitus with hyperglycemia: Secondary | ICD-10-CM | POA: Diagnosis not present

## 2017-10-08 DIAGNOSIS — I2510 Atherosclerotic heart disease of native coronary artery without angina pectoris: Secondary | ICD-10-CM | POA: Diagnosis not present

## 2017-10-08 DIAGNOSIS — I129 Hypertensive chronic kidney disease with stage 1 through stage 4 chronic kidney disease, or unspecified chronic kidney disease: Secondary | ICD-10-CM | POA: Diagnosis not present

## 2017-10-08 DIAGNOSIS — N183 Chronic kidney disease, stage 3 (moderate): Secondary | ICD-10-CM | POA: Diagnosis not present

## 2017-10-08 DIAGNOSIS — E1122 Type 2 diabetes mellitus with diabetic chronic kidney disease: Secondary | ICD-10-CM | POA: Diagnosis not present

## 2017-10-09 DIAGNOSIS — I2510 Atherosclerotic heart disease of native coronary artery without angina pectoris: Secondary | ICD-10-CM | POA: Diagnosis not present

## 2017-10-09 DIAGNOSIS — E1122 Type 2 diabetes mellitus with diabetic chronic kidney disease: Secondary | ICD-10-CM | POA: Diagnosis not present

## 2017-10-09 DIAGNOSIS — I129 Hypertensive chronic kidney disease with stage 1 through stage 4 chronic kidney disease, or unspecified chronic kidney disease: Secondary | ICD-10-CM | POA: Diagnosis not present

## 2017-10-09 DIAGNOSIS — G894 Chronic pain syndrome: Secondary | ICD-10-CM | POA: Diagnosis not present

## 2017-10-09 DIAGNOSIS — E1165 Type 2 diabetes mellitus with hyperglycemia: Secondary | ICD-10-CM | POA: Diagnosis not present

## 2017-10-09 DIAGNOSIS — N183 Chronic kidney disease, stage 3 (moderate): Secondary | ICD-10-CM | POA: Diagnosis not present

## 2017-10-13 DIAGNOSIS — E1122 Type 2 diabetes mellitus with diabetic chronic kidney disease: Secondary | ICD-10-CM | POA: Diagnosis not present

## 2017-10-13 DIAGNOSIS — I129 Hypertensive chronic kidney disease with stage 1 through stage 4 chronic kidney disease, or unspecified chronic kidney disease: Secondary | ICD-10-CM | POA: Diagnosis not present

## 2017-10-13 DIAGNOSIS — E1165 Type 2 diabetes mellitus with hyperglycemia: Secondary | ICD-10-CM | POA: Diagnosis not present

## 2017-10-13 DIAGNOSIS — G894 Chronic pain syndrome: Secondary | ICD-10-CM | POA: Diagnosis not present

## 2017-10-13 DIAGNOSIS — N183 Chronic kidney disease, stage 3 (moderate): Secondary | ICD-10-CM | POA: Diagnosis not present

## 2017-10-13 DIAGNOSIS — I2510 Atherosclerotic heart disease of native coronary artery without angina pectoris: Secondary | ICD-10-CM | POA: Diagnosis not present

## 2017-10-14 DIAGNOSIS — M25562 Pain in left knee: Secondary | ICD-10-CM | POA: Diagnosis not present

## 2017-10-14 DIAGNOSIS — M25561 Pain in right knee: Secondary | ICD-10-CM | POA: Diagnosis not present

## 2017-10-14 DIAGNOSIS — M170 Bilateral primary osteoarthritis of knee: Secondary | ICD-10-CM | POA: Diagnosis not present

## 2017-10-14 NOTE — Telephone Encounter (Signed)
I am happy to have speech therapy for this pt! Do I need to place the order?

## 2017-10-14 NOTE — Telephone Encounter (Signed)
I love it!

## 2017-10-15 ENCOUNTER — Telehealth: Payer: Self-pay | Admitting: Physician Assistant

## 2017-10-15 DIAGNOSIS — E1165 Type 2 diabetes mellitus with hyperglycemia: Secondary | ICD-10-CM | POA: Diagnosis not present

## 2017-10-15 DIAGNOSIS — G894 Chronic pain syndrome: Secondary | ICD-10-CM | POA: Diagnosis not present

## 2017-10-15 DIAGNOSIS — I2510 Atherosclerotic heart disease of native coronary artery without angina pectoris: Secondary | ICD-10-CM | POA: Diagnosis not present

## 2017-10-15 DIAGNOSIS — N183 Chronic kidney disease, stage 3 (moderate): Secondary | ICD-10-CM | POA: Diagnosis not present

## 2017-10-15 DIAGNOSIS — E1122 Type 2 diabetes mellitus with diabetic chronic kidney disease: Secondary | ICD-10-CM | POA: Diagnosis not present

## 2017-10-15 DIAGNOSIS — I129 Hypertensive chronic kidney disease with stage 1 through stage 4 chronic kidney disease, or unspecified chronic kidney disease: Secondary | ICD-10-CM | POA: Diagnosis not present

## 2017-10-15 NOTE — Telephone Encounter (Signed)
Copied from CRM [phone]. Topic: Quick Communication - See Telephone Encounter >> Oct 15, 2017  2:26 PM [name], Rosey Batheresa D wrote: Gildardo CrankerNicole Sharpard from [facility] called and would like to know if provider can change patients insulin glargine (LANTUS) 100 UNIT/ML injection to a pen instead because it would be better for patient and sent it to [facility] [address] - [place], [place] - [id number] She can be reached at [phone] if there are any questions, thanks.

## 2017-10-15 NOTE — Telephone Encounter (Signed)
[facility] requesting insulin injection be changed to insulin pen.

## 2017-10-17 ENCOUNTER — Other Ambulatory Visit: Payer: Self-pay | Admitting: Physician Assistant

## 2017-10-17 DIAGNOSIS — E118 Type 2 diabetes mellitus with unspecified complications: Secondary | ICD-10-CM

## 2017-10-17 DIAGNOSIS — E1165 Type 2 diabetes mellitus with hyperglycemia: Secondary | ICD-10-CM | POA: Diagnosis not present

## 2017-10-17 DIAGNOSIS — I2510 Atherosclerotic heart disease of native coronary artery without angina pectoris: Secondary | ICD-10-CM | POA: Diagnosis not present

## 2017-10-17 DIAGNOSIS — E1122 Type 2 diabetes mellitus with diabetic chronic kidney disease: Secondary | ICD-10-CM | POA: Diagnosis not present

## 2017-10-17 DIAGNOSIS — N183 Chronic kidney disease, stage 3 (moderate): Secondary | ICD-10-CM | POA: Diagnosis not present

## 2017-10-17 DIAGNOSIS — I129 Hypertensive chronic kidney disease with stage 1 through stage 4 chronic kidney disease, or unspecified chronic kidney disease: Secondary | ICD-10-CM | POA: Diagnosis not present

## 2017-10-17 DIAGNOSIS — G894 Chronic pain syndrome: Secondary | ICD-10-CM | POA: Diagnosis not present

## 2017-10-17 MED ORDER — INSULIN GLARGINE 100 UNIT/ML SOLOSTAR PEN: 24.0000 [IU] | PEN_INJECTOR | Freq: Every day | SUBCUTANEOUS | 99 refills | Status: DC

## 2017-10-17 NOTE — Telephone Encounter (Signed)
[name] [name] from [facility] of verbal order. She will c/b if she has further needs.

## 2017-10-17 NOTE — Telephone Encounter (Signed)
done

## 2017-10-17 NOTE — Telephone Encounter (Signed)
Please advise 

## 2017-10-20 DIAGNOSIS — I2510 Atherosclerotic heart disease of native coronary artery without angina pectoris: Secondary | ICD-10-CM | POA: Diagnosis not present

## 2017-10-20 DIAGNOSIS — G894 Chronic pain syndrome: Secondary | ICD-10-CM | POA: Diagnosis not present

## 2017-10-20 DIAGNOSIS — E1165 Type 2 diabetes mellitus with hyperglycemia: Secondary | ICD-10-CM | POA: Diagnosis not present

## 2017-10-20 DIAGNOSIS — I129 Hypertensive chronic kidney disease with stage 1 through stage 4 chronic kidney disease, or unspecified chronic kidney disease: Secondary | ICD-10-CM | POA: Diagnosis not present

## 2017-10-20 DIAGNOSIS — E1122 Type 2 diabetes mellitus with diabetic chronic kidney disease: Secondary | ICD-10-CM | POA: Diagnosis not present

## 2017-10-20 DIAGNOSIS — N183 Chronic kidney disease, stage 3 (moderate): Secondary | ICD-10-CM | POA: Diagnosis not present

## 2017-10-20 NOTE — Telephone Encounter (Unsigned)
Copied from CRM [id number]. Topic: Inquiry >> Oct 20, 2017 11:01 AM Raquel SarnaHayes, Teresa G wrote: Pt is thinking she may need more than 20 mins for visit with Dr. Dala DockMcVey on Wed. At 4pm

## 2017-10-20 NOTE — Telephone Encounter (Signed)
Left detailed message that we are unable to extent appointment slot.

## 2017-10-21 DIAGNOSIS — I129 Hypertensive chronic kidney disease with stage 1 through stage 4 chronic kidney disease, or unspecified chronic kidney disease: Secondary | ICD-10-CM | POA: Diagnosis not present

## 2017-10-21 DIAGNOSIS — E1165 Type 2 diabetes mellitus with hyperglycemia: Secondary | ICD-10-CM | POA: Diagnosis not present

## 2017-10-21 DIAGNOSIS — E1122 Type 2 diabetes mellitus with diabetic chronic kidney disease: Secondary | ICD-10-CM | POA: Diagnosis not present

## 2017-10-21 DIAGNOSIS — G894 Chronic pain syndrome: Secondary | ICD-10-CM | POA: Diagnosis not present

## 2017-10-21 DIAGNOSIS — I2510 Atherosclerotic heart disease of native coronary artery without angina pectoris: Secondary | ICD-10-CM | POA: Diagnosis not present

## 2017-10-21 DIAGNOSIS — N183 Chronic kidney disease, stage 3 (moderate): Secondary | ICD-10-CM | POA: Diagnosis not present

## 2017-10-22 ENCOUNTER — Encounter: Payer: Self-pay | Admitting: Physician Assistant

## 2017-10-22 ENCOUNTER — Telehealth: Payer: Self-pay

## 2017-10-22 ENCOUNTER — Ambulatory Visit (INDEPENDENT_AMBULATORY_CARE_PROVIDER_SITE_OTHER): Payer: Medicare Other | Admitting: Physician Assistant

## 2017-10-22 VITALS — BP 130/70 | HR 66 | Temp 98.2°F | Resp 16 | Ht 63.0 in | Wt 167.0 lb

## 2017-10-22 DIAGNOSIS — E118 Type 2 diabetes mellitus with unspecified complications: Secondary | ICD-10-CM | POA: Diagnosis not present

## 2017-10-22 DIAGNOSIS — E1165 Type 2 diabetes mellitus with hyperglycemia: Secondary | ICD-10-CM | POA: Diagnosis not present

## 2017-10-22 DIAGNOSIS — G894 Chronic pain syndrome: Secondary | ICD-10-CM | POA: Diagnosis not present

## 2017-10-22 DIAGNOSIS — F0630 Mood disorder due to known physiological condition, unspecified: Secondary | ICD-10-CM

## 2017-10-22 DIAGNOSIS — N183 Chronic kidney disease, stage 3 (moderate): Secondary | ICD-10-CM | POA: Diagnosis not present

## 2017-10-22 DIAGNOSIS — I129 Hypertensive chronic kidney disease with stage 1 through stage 4 chronic kidney disease, or unspecified chronic kidney disease: Secondary | ICD-10-CM | POA: Diagnosis not present

## 2017-10-22 DIAGNOSIS — I2510 Atherosclerotic heart disease of native coronary artery without angina pectoris: Secondary | ICD-10-CM | POA: Diagnosis not present

## 2017-10-22 DIAGNOSIS — E1122 Type 2 diabetes mellitus with diabetic chronic kidney disease: Secondary | ICD-10-CM | POA: Diagnosis not present

## 2017-10-22 LAB — POCT GLYCOSYLATED HEMOGLOBIN (HGB A1C): Hemoglobin A1C: 12.6

## 2017-10-22 MED ORDER — INSULIN GLARGINE 100 UNIT/ML SOLOSTAR PEN: 24.0000 [IU] | PEN_INJECTOR | Freq: Every day | SUBCUTANEOUS | 99 refills | Status: DC

## 2017-10-22 MED ORDER — AMPHETAMINE-DEXTROAMPHETAMINE 20 MG PO TABS: 20.0000 mg | ORAL_TABLET | Freq: Two times a day (BID) | ORAL | 0 refills | Status: DC | PRN

## 2017-10-22 MED ORDER — VENLAFAXINE HCL ER 75 MG PO CP24: 75.0000 mg | ORAL_CAPSULE | Freq: Every day | ORAL | 0 refills | Status: DC

## 2017-10-22 NOTE — Patient Instructions (Addendum)
Start back on your Metformin.  Metformin Dosing (to be taken with food) Week 1: take 1/2 tablet twice a day. Week 2: take 1 tablet in the morning.    You can pick up your Lantus Solostar pen at your pharmacy.  Come back in 4-6 weeks to check in with me about the Effexor dose.   Thank you for coming in today. I hope you feel we met your needs.  Feel free to call PCP if you have any questions or further requests.  Please consider signing up for MyChart if you do not already have it, as this is a great way to communicate with me.  Best,  Whitney McVey, PA-C    IF you received an x-ray today, you will receive an invoice from [facility]. Please contact [facility] at [phone] with questions or concerns regarding your invoice.   IF you received labwork today, you will receive an invoice from [facility]. Please contact [facility] at [phone] with questions or concerns regarding your invoice.   Our billing staff will not be able to assist you with questions regarding bills from these companies.  You will be contacted with the lab results as soon as they are available. The fastest way to get your results is to activate your My Chart account. Instructions are located on the last page of this paperwork. If you have not heard from [place] regarding the results in 2 weeks, please contact this office.

## 2017-10-22 NOTE — Telephone Encounter (Signed)
Copied from CRM [id number]. Topic: Quick Communication - See Telephone Encounter >> Oct 15, 2017  2:26 PM [name], Rosey Batheresa D wrote: Gildardo CrankerNicole Sharpard from [facility] called and would like to know if provider can change patients insulin glargine (LANTUS) 100 UNIT/ML injection to a pen instead because it would be better for patient and [facility] [address] - [place], [place] - [id number] She can be reached at [phone] if there are any questions, thanks. >> Oct 22, 2017  1:41 PM Floria RavelingStovall, Shana A wrote: Joni Reiningicole was calling back in to check the status of this and also to report blood sugar  Lowest 100 Highest 334

## 2017-10-22 NOTE — Progress Notes (Signed)
[name]  MRN: [id number] DOB: 1940-12-17  PCP: Dorise Hiss, PA-C  Subjective:  Pt is a 76 year old female who presents to clinic for medication counseling.   She would like to switch insulin route to a pen rather than syringe.  She and her husband Kathleen Hoff do not feel comfortable handling the syringe and drawing up medication.  Overall she is feeling great. She is tired from all the activity and therapy. Home health nurses are coming and going, helping her with medications. PT is making her sore, taking a few days to recover.   She still needs adderall. Needs refill - '20mg'$  bid.  effexor worked better than prozac for depression. She has tapered down off the prozac and would like to start effexor.   Review of Systems  Constitutional: Negative for chills, diaphoresis, fatigue and fever.  Respiratory: Negative for cough, chest tightness, shortness of breath and wheezing.   Cardiovascular: Negative for chest pain and palpitations.  Gastrointestinal: Negative for abdominal pain, diarrhea, nausea and vomiting.  Endocrine: Negative for polydipsia, polyphagia and polyuria.  Neurological: Negative for weakness, light-headedness and headaches.    Patient Active Problem List   Diagnosis Date Noted  . Hyperglycemia 09/25/2017  . Type 2 diabetes mellitus with complication, without long-term current use of insulin ([place]) 04/24/2017  . ADD (attention deficit disorder) 12/30/2016  . Narcotic addiction ([place]) 12/05/2014  . Excessive daytime sleepiness 12/05/2014  . Alteration in psychomotor activity 12/05/2014  . Depression with somatization 12/05/2014  . Hypersomnia, persistent 09/10/2013  . Obesity, unspecified 09/10/2013  . Chronic pain syndrome 09/10/2013  . Depression 07/31/2013  . Medication intolerance 07/19/2013  . Anxiety 07/19/2013  . CAD (coronary artery disease) status post stent to distal right coronary artery 2006 06/20/2013  . Neurocardiogenic syncope 06/20/2013    . Hyperlipidemia 06/20/2013    Current Outpatient Medications on File Prior to Visit  Medication Sig Dispense Refill  . Alfalfa 250 MG TABS Take 1 tablet by mouth every morning.    Marland Kitchen amphetamine-dextroamphetamine (ADDERALL) 20 MG tablet Take 1 tablet (20 mg total) by mouth 2 (two) times daily. (Patient taking differently: Take 20 mg by mouth 2 (two) times daily as needed. ) 60 tablet 0  . aspirin 81 MG tablet Take 81 mg by mouth daily.    Marland Kitchen atenolol (TENORMIN) 25 MG tablet TAKE 1 TABLET BY MOUTH IN THE MORNING AND 2 IN THE EVENING 270 tablet 0  . blood glucose meter kit and supplies Dispense based on patient and insurance preference. Use up to four times daily as directed. (FOR ICD-9 250.00, 250.01). 1 each 0  . Cholecalciferol (VITAMIN D3) 5000 units TBDP Take 1 capsule by mouth daily.    . CHROMIUM ASPARTATE PO Take 1 tablet by mouth every morning.    Verneita Griffes Bark POWD Take 1 Dose by mouth daily.    . DHA-EPA-Coenzyme Q10-Vitamin E (GNP COQ-10 & FISH OIL) 120-180-50-30 CAPS Take 1 tablet by mouth daily.    . fexofenadine (ALLEGRA) 180 MG tablet Take 180 mg by mouth daily as needed.     Marland Kitchen FLUoxetine (PROZAC) 20 MG capsule TAKE 1 CAPSULE BY MOUTH TWICE DAILY 60 capsule 0  . hydrochlorothiazide (MICROZIDE) 12.5 MG capsule Take 1 capsule (12.5 mg total) by mouth daily. 30 capsule 2  . HYDROcodone-acetaminophen (NORCO) 10-325 MG per tablet Take 1-2 tablets by mouth every 4 (four) hours as needed for moderate pain.     . Insulin Glargine (LANTUS SOLOSTAR) 100 UNIT/ML Solostar  Pen Inject 24 Units into the skin daily at 10 pm. 5 pen PRN  . Insulin Syringe-Needle U-100 (INSULIN SYRINGE .5CC/31GX5/16") 31G X 5/16" 0.5 ML MISC Use as directed 100 each 0  . levothyroxine (SYNTHROID, LEVOTHROID) 50 MCG tablet Take 1 tablet (50 mcg total) by mouth daily before breakfast. 30 tablet 5  . lisinopril (PRINIVIL,ZESTRIL) 20 MG tablet TAKE 1 TABLET BY MOUTH IN THE MORNING AND 1/2 (ONE-HALF) AT BEDTIME 135  tablet 0  . magnesium oxide (MAG-OX) 400 MG tablet Take 400 mg by mouth 2 (two) times daily.    . metFORMIN (GLUCOPHAGE) 500 MG tablet Take 1 tablet (500 mg total) by mouth 2 (two) times daily with a meal. Wk1: 1/2 tab 2x/day; Wk2: 1 tab am, 1/2 tab pm; Wk3: 2 tabs 2x/day. 180 tablet 3  . Misc. Devices (HUGO ROLLING WALKER ELITE) MISC 1 Units by Does not apply route daily as needed. 1 each 0  . potassium chloride (K-DUR,KLOR-CON) 10 MEQ tablet TAKE 1 TAB (10 MEQ TOTAL) BY MOUTH AS NEEDED (ONLY TAKES IF SHE DOES NOT EAT POTASSIUM RICH FOODS) 30 tablet 0   No current facility-administered medications on file prior to visit.     Allergies  Allergen Reactions  . Cardizem [Diltiazem Hcl]     nausea  . Codeine     Feeling weird   . Coreg [Carvedilol]   . Demerol [Meperidine]     Per pt disoriented  . Erythromycin     Very sick  . Inderal [Propranolol]     Made me cry  . Procardia [Nifedipine]   . Wellbutrin [Bupropion]   . Zoloft [Sertraline Hcl]     Could not talk     Objective:  BP 130/70   Pulse 66   Temp 98.2 F (36.8 C) (Oral)   Resp 16   Ht '5\' 3"'$  (1.6 m) Comment: per pt  Wt 167 lb (75.8 kg) Comment: per pt  SpO2 95%   BMI 29.58 kg/m   Physical Exam  Constitutional: She is oriented to person, place, and time and well-developed, well-nourished, and in no distress. No distress.  Cardiovascular: Normal rate, regular rhythm and normal heart sounds.  Neurological: She is alert and oriented to person, place, and time. GCS score is 15.  Skin: Skin is warm and dry.  Psychiatric: Mood, memory, affect and judgment normal.  Vitals reviewed.  Results for orders placed or performed in visit on 10/22/17  POCT glycosylated hemoglobin (Hb A1C)  Result Value Ref Range   Hemoglobin A1C 12.6     Assessment and Plan :  1. Type 2 diabetes mellitus with complication, without long-term current use of insulin (HCC) - Insulin Glargine (LANTUS SOLOSTAR) 100 UNIT/ML Solostar Pen; Inject  24 Units into the skin daily at 10 pm.  Dispense: 5 pen; Refill: PRN - POCT glycosylated hemoglobin (Hb A1C) - A1C is trending down. Last A1C was 14.0. Today's is 12.6. RTC in 3 months for f/u DM. Check A1C at that OV.  2. Mood disorder in conditions classified elsewhere - amphetamine-dextroamphetamine (ADDERALL) 20 MG tablet; Take 1 tablet (20 mg total) by mouth 2 (two) times daily as needed.  Dispense: 60 tablet; Refill: 0 - venlafaxine XR (EFFEXOR-XR) 75 MG 24 hr capsule; Take 1 capsule (75 mg total) by mouth daily with breakfast.  Dispense: 30 capsule; Refill: 0 - Stop Prozac, start Effexor. RTC in 4-6 weeks for recheck.   Mercer Pod, PA-C  Primary Care at [facility] 10/22/2017 4:54  PM

## 2017-10-23 ENCOUNTER — Other Ambulatory Visit: Payer: Self-pay | Admitting: Physician Assistant

## 2017-10-23 ENCOUNTER — Telehealth: Payer: Self-pay | Admitting: Physician Assistant

## 2017-10-23 NOTE — Telephone Encounter (Signed)
Copied from [place]. Topic: Quick Communication - See Telephone Encounter >> Oct 23, 2017  9:25 AM Synthia Innocent wrote: CRM for notification. See Telephone encounter for:  Requesting refill of adderall, fell yesterday when she got home going in the house. In a lot of pain. Does not think she needs to be seen, just wants to make provider aware 10/23/17.

## 2017-10-23 NOTE — Telephone Encounter (Signed)
Copied from CRM #[id number]. Topic: Inquiry >> Oct 23, 2017  1:29 PM Sonnie Bias L, NT wrote: Reason for CRM:Patient called again to check on the status on adderall ,generic and awould like a call back says she would like this by tomorrow if possible  

## 2017-10-23 NOTE — Telephone Encounter (Signed)
See phone message.  Sent to [employer]

## 2017-10-23 NOTE — Telephone Encounter (Signed)
Copied from CRM #[id number]. Topic: Inquiry >> Oct 23, 2017  1:29 PM Stephannie LiSimmons, Kamile Fassler L, NT wrote: Reason for ZOX:[id number]:Patient called again to check on the status on adderall ,generic and awould like a call back says she would like this by tomorrow if possible

## 2017-10-23 NOTE — Telephone Encounter (Signed)
Request for controlled substance; see CRM [id number]

## 2017-10-24 ENCOUNTER — Other Ambulatory Visit: Payer: Self-pay | Admitting: Physician Assistant

## 2017-10-24 ENCOUNTER — Other Ambulatory Visit: Payer: Self-pay

## 2017-10-24 ENCOUNTER — Telehealth: Payer: Self-pay

## 2017-10-24 DIAGNOSIS — E118 Type 2 diabetes mellitus with unspecified complications: Secondary | ICD-10-CM

## 2017-10-24 DIAGNOSIS — F0630 Mood disorder due to known physiological condition, unspecified: Secondary | ICD-10-CM

## 2017-10-24 MED ORDER — AMPHETAMINE-DEXTROAMPHETAMINE 20 MG PO TABS: 20.0000 mg | ORAL_TABLET | Freq: Two times a day (BID) | ORAL | 0 refills | Status: DC | PRN

## 2017-10-24 MED ORDER — INSULIN GLARGINE 100 UNIT/ML SOLOSTAR PEN
24.0000 [IU] | PEN_INJECTOR | Freq: Every day | SUBCUTANEOUS | 99 refills | Status: DC
Start: 2017-10-24 — End: 2018-11-11

## 2017-10-24 NOTE — Telephone Encounter (Signed)
Discussed with Whitney McVey PA - she reordered Adderall. Called pt and advised.  She had questions about Effexor - wanted to know how long time released meant - advised 24 hours.  Asked if it would interfere with her sleep-advised not if taken as prescribed with breakfast in the am.

## 2017-10-24 NOTE — Telephone Encounter (Signed)
Left vm for pt to return call. Called to discuss community resource referral received.    Sherle PoeNicole Orvilla Truett, B.A.  Care Guide - [facility] at [facility] [phone]

## 2017-10-24 NOTE — Progress Notes (Signed)
Spoke with pharmacy.  Reported this pt. Did bring in a printed Rx for this medication yesterday, and has picked it up.

## 2017-10-27 ENCOUNTER — Telehealth: Payer: Self-pay

## 2017-10-27 NOTE — Telephone Encounter (Signed)
Patient does not want to schedule AWV at this time but will in the future when she is feeling better.    Sherle PoeNicole Ryane Canavan, B.A.  Care Guide - Primary Care at [facility] [phone]

## 2017-10-27 NOTE — Telephone Encounter (Signed)
Called pt to discuss community resources. Pt requested information about husband's VA benefits and in home services as well as other agencies that might provide some help in the home. Patient has appointment tomorrow with PCP. Will leave information for her to pick up at appointment.    Sherle PoeNicole Tita Terhaar, B.A.  Care Guide - [facility] at [facility] [phone]

## 2017-10-28 ENCOUNTER — Other Ambulatory Visit: Payer: Self-pay

## 2017-10-28 ENCOUNTER — Ambulatory Visit (INDEPENDENT_AMBULATORY_CARE_PROVIDER_SITE_OTHER): Payer: Medicare Other | Admitting: Physician Assistant

## 2017-10-28 ENCOUNTER — Encounter: Payer: Self-pay | Admitting: Physician Assistant

## 2017-10-28 ENCOUNTER — Ambulatory Visit: Payer: Medicare Other | Admitting: Registered"

## 2017-10-28 VITALS — BP 126/78 | HR 84 | Temp 98.5°F | Resp 12 | Ht 63.0 in | Wt 169.4 lb

## 2017-10-28 DIAGNOSIS — G894 Chronic pain syndrome: Secondary | ICD-10-CM

## 2017-10-28 DIAGNOSIS — F909 Attention-deficit hyperactivity disorder, unspecified type: Secondary | ICD-10-CM

## 2017-10-28 MED ORDER — LISDEXAMFETAMINE DIMESYLATE 30 MG PO CAPS: 30.0000 mg | ORAL_CAPSULE | Freq: Every day | ORAL | 0 refills | Status: DC

## 2017-10-28 NOTE — Progress Notes (Signed)
BEDA DULA  MRN: [id number] DOB: 1941-09-06  PCP: Dorise Hiss, PA-C  Subjective:  Pt is a 76 year old female PMH DM who presents to clinic for help with ADLs. She has long history of weakness, concentration difficulty. Recent hospitalization due to elevated blood sugars. She is feeling better since she has received home care help.  11/28 - stop prozac and start effexor.   Carloyn Manner the OT comes tomorrow.  Amber PT comes later this week.  Lexine Baton is Games developer came last week.   She needs help with meals. One morning she did not eat breakfast until noon. Her husband does not clean the house.   Review of Systems  Constitutional: Negative for chills, diaphoresis, fatigue and fever.  Musculoskeletal: Positive for arthralgias.  Neurological: Positive for weakness.    Patient Active Problem List   Diagnosis Date Noted  . Hyperglycemia 09/25/2017  . Type 2 diabetes mellitus with complication, without long-term current use of insulin ([place]) 04/24/2017  . ADD (attention deficit disorder) 12/30/2016  . Narcotic addiction ([place]) 12/05/2014  . Excessive daytime sleepiness 12/05/2014  . Alteration in psychomotor activity 12/05/2014  . Depression with somatization 12/05/2014  . Hypersomnia, persistent 09/10/2013  . Obesity, unspecified 09/10/2013  . Chronic pain syndrome 09/10/2013  . Depression 07/31/2013  . Medication intolerance 07/19/2013  . Anxiety 07/19/2013  . CAD (coronary artery disease) status post stent to distal right coronary artery 2006 06/20/2013  . Neurocardiogenic syncope 06/20/2013  . Hyperlipidemia 06/20/2013    Current Outpatient Medications on File Prior to Visit  Medication Sig Dispense Refill  . Alfalfa 250 MG TABS Take 1 tablet by mouth every morning.    Marland Kitchen amphetamine-dextroamphetamine (ADDERALL) 20 MG tablet Take 1 tablet (20 mg total) by mouth 2 (two) times daily as needed. 60 tablet 0  . aspirin 81 MG tablet Take 81 mg by mouth daily.    Marland Kitchen  atenolol (TENORMIN) 25 MG tablet TAKE 1 TABLET BY MOUTH IN THE MORNING AND 2 IN THE EVENING 270 tablet 0  . blood glucose meter kit and supplies Dispense based on patient and insurance preference. Use up to four times daily as directed. (FOR ICD-9 250.00, 250.01). 1 each 0  . Cholecalciferol (VITAMIN D3) 5000 units TBDP Take 1 capsule by mouth daily.    . CHROMIUM ASPARTATE PO Take 1 tablet by mouth every morning.    Verneita Griffes Bark POWD Take 1 Dose by mouth daily.    . DHA-EPA-Coenzyme Q10-Vitamin E (GNP COQ-10 & FISH OIL) 120-180-50-30 CAPS Take 1 tablet by mouth daily.    . fexofenadine (ALLEGRA) 180 MG tablet Take 180 mg by mouth daily as needed.     Marland Kitchen FLUoxetine (PROZAC) 20 MG capsule TAKE 1 CAPSULE BY MOUTH TWICE DAILY 60 capsule 0  . hydrochlorothiazide (MICROZIDE) 12.5 MG capsule Take 1 capsule (12.5 mg total) by mouth daily. 30 capsule 2  . HYDROcodone-acetaminophen (NORCO) 10-325 MG per tablet Take 1-2 tablets by mouth every 4 (four) hours as needed for moderate pain.     . Insulin Glargine (LANTUS SOLOSTAR) 100 UNIT/ML Solostar Pen Inject 24 Units into the skin daily at 10 pm. 5 pen PRN  . Insulin Syringe-Needle U-100 (INSULIN SYRINGE .5CC/31GX5/16") 31G X 5/16" 0.5 ML MISC Use as directed 100 each 0  . levothyroxine (SYNTHROID, LEVOTHROID) 50 MCG tablet Take 1 tablet (50 mcg total) by mouth daily before breakfast. 30 tablet 5  . lisinopril (PRINIVIL,ZESTRIL) 20 MG tablet TAKE 1 TABLET BY MOUTH IN  THE MORNING AND 1/2 (ONE-HALF) AT BEDTIME 135 tablet 0  . magnesium oxide (MAG-OX) 400 MG tablet Take 400 mg by mouth 2 (two) times daily.    . metFORMIN (GLUCOPHAGE) 500 MG tablet Take 1 tablet (500 mg total) by mouth 2 (two) times daily with a meal. Wk1: 1/2 tab 2x/day; Wk2: 1 tab am, 1/2 tab pm; Wk3: 2 tabs 2x/day. 180 tablet 3  . Misc. Devices (HUGO ROLLING WALKER ELITE) MISC 1 Units by Does not apply route daily as needed. 1 each 0  . potassium chloride (K-DUR,KLOR-CON) 10 MEQ tablet TAKE 1  TAB (10 MEQ TOTAL) BY MOUTH AS NEEDED (ONLY TAKES IF SHE DOES NOT EAT POTASSIUM RICH FOODS) 30 tablet 0  . [START ON 11/21/2017] venlafaxine XR (EFFEXOR-XR) 75 MG 24 hr capsule Take 1 capsule (75 mg total) by mouth daily with breakfast. 30 capsule 0   No current facility-administered medications on file prior to visit.     Allergies  Allergen Reactions  . Cardizem [Diltiazem Hcl]     nausea  . Codeine     Feeling weird   . Coreg [Carvedilol]   . Demerol [Meperidine]     Per pt disoriented  . Erythromycin     Very sick  . Inderal [Propranolol]     Made me cry  . Procardia [Nifedipine]   . Wellbutrin [Bupropion]   . Zoloft [Sertraline Hcl]     Could not talk     Objective:  BP 126/78   Pulse 84   Temp 98.5 F (36.9 C)   Resp 12   Ht '5\' 3"'  (1.6 m)   Wt 169 lb 6.4 oz (76.8 kg)   SpO2 95%   BMI 30.01 kg/m   Physical Exam  Constitutional: She is oriented to person, place, and time and well-developed, well-nourished, and in no distress. No distress.  Cardiovascular: Normal rate, regular rhythm and normal heart sounds.  Musculoskeletal:  This is the first time in a while I have seen this pt not in a wheelchair. She is ambulating on her own today.   Neurological: She is alert and oriented to person, place, and time. GCS score is 15.  Skin: Skin is warm and dry.  Psychiatric: Mood, memory, affect and judgment normal.  Vitals reviewed.   Assessment and Plan :  1. Chronic pain syndrome - Ambulatory referral to [facility]cted [facility] - Pt has several specialists coming to her home recently, however she needs help with ADLs. She is not getting meals on time because her husband is not keeping up (per pt). OK to ask for Connect Care referral to help in this regard.  2. Attention deficit hyperactivity disorder (ADHD), unspecified ADHD type - lisdexamfetamine (VYVANSE) 30 MG capsule; Take 1 capsule (30 mg total) by mouth daily.  Dispense: 30 capsule; Refill: 0 - Stop adderall. Will try  Vyvanse. RTC in 3-4 weeks for recheck.   Mercer Pod, PA-C  Primary Care at [facility] 10/28/2017 5:28 PM

## 2017-10-28 NOTE — Patient Instructions (Signed)
  Start taking Vyvanse 23m in the morning. Stop taking Adderall.   Come back and see me in 3-4 weeks.   Thank you for coming in today. I hope you feel we met your needs.  Feel free to call PCP if you have any questions or further requests.  Please consider signing up for MyChart if you do not already have it, as this is a great way to communicate with me.  Best,  [employer] PA-C

## 2017-10-29 DIAGNOSIS — G894 Chronic pain syndrome: Secondary | ICD-10-CM | POA: Diagnosis not present

## 2017-10-29 DIAGNOSIS — E1122 Type 2 diabetes mellitus with diabetic chronic kidney disease: Secondary | ICD-10-CM | POA: Diagnosis not present

## 2017-10-29 DIAGNOSIS — N183 Chronic kidney disease, stage 3 (moderate): Secondary | ICD-10-CM | POA: Diagnosis not present

## 2017-10-29 DIAGNOSIS — I129 Hypertensive chronic kidney disease with stage 1 through stage 4 chronic kidney disease, or unspecified chronic kidney disease: Secondary | ICD-10-CM | POA: Diagnosis not present

## 2017-10-29 DIAGNOSIS — E1165 Type 2 diabetes mellitus with hyperglycemia: Secondary | ICD-10-CM | POA: Diagnosis not present

## 2017-10-29 DIAGNOSIS — I2510 Atherosclerotic heart disease of native coronary artery without angina pectoris: Secondary | ICD-10-CM | POA: Diagnosis not present

## 2017-10-30 DIAGNOSIS — I2510 Atherosclerotic heart disease of native coronary artery without angina pectoris: Secondary | ICD-10-CM | POA: Diagnosis not present

## 2017-10-30 DIAGNOSIS — E1165 Type 2 diabetes mellitus with hyperglycemia: Secondary | ICD-10-CM | POA: Diagnosis not present

## 2017-10-30 DIAGNOSIS — G894 Chronic pain syndrome: Secondary | ICD-10-CM | POA: Diagnosis not present

## 2017-10-30 DIAGNOSIS — E1122 Type 2 diabetes mellitus with diabetic chronic kidney disease: Secondary | ICD-10-CM | POA: Diagnosis not present

## 2017-10-30 DIAGNOSIS — I129 Hypertensive chronic kidney disease with stage 1 through stage 4 chronic kidney disease, or unspecified chronic kidney disease: Secondary | ICD-10-CM | POA: Diagnosis not present

## 2017-10-30 DIAGNOSIS — N183 Chronic kidney disease, stage 3 (moderate): Secondary | ICD-10-CM | POA: Diagnosis not present

## 2017-11-07 DIAGNOSIS — E1165 Type 2 diabetes mellitus with hyperglycemia: Secondary | ICD-10-CM | POA: Diagnosis not present

## 2017-11-07 DIAGNOSIS — E1122 Type 2 diabetes mellitus with diabetic chronic kidney disease: Secondary | ICD-10-CM | POA: Diagnosis not present

## 2017-11-07 DIAGNOSIS — N183 Chronic kidney disease, stage 3 (moderate): Secondary | ICD-10-CM | POA: Diagnosis not present

## 2017-11-07 DIAGNOSIS — I2510 Atherosclerotic heart disease of native coronary artery without angina pectoris: Secondary | ICD-10-CM | POA: Diagnosis not present

## 2017-11-07 DIAGNOSIS — G894 Chronic pain syndrome: Secondary | ICD-10-CM | POA: Diagnosis not present

## 2017-11-07 DIAGNOSIS — I129 Hypertensive chronic kidney disease with stage 1 through stage 4 chronic kidney disease, or unspecified chronic kidney disease: Secondary | ICD-10-CM | POA: Diagnosis not present

## 2017-11-12 ENCOUNTER — Telehealth: Payer: Self-pay

## 2017-11-12 NOTE — Telephone Encounter (Unsigned)
Copied from CRM [phone]#[id number]. Topic: Inquiry >> Oct 31, 2017  5:16 PM Alexander BergeronBarksdale, Harvey B wrote: Reason for CRM: called to state that she wants Dr. Dala DockMcVey to put a hold on [facility] for [facility],due to a relapse,  contact pt if needed  >> Nov 12, 2017  3:54 PM Trula SladeWalter, Linda F wrote: Patient wanted the doctor to know that she is feeling better from the relapse and she has made an appt for Friday 11/14/17 to discuss her meds.  She has not taken the Vyvance and Effexor-XR since she refilled them until she talks to the doctor about them.

## 2017-11-14 ENCOUNTER — Telehealth: Payer: Self-pay | Admitting: Physician Assistant

## 2017-11-14 ENCOUNTER — Ambulatory Visit: Payer: Medicare Other | Admitting: Physician Assistant

## 2017-11-14 DIAGNOSIS — N183 Chronic kidney disease, stage 3 (moderate): Secondary | ICD-10-CM | POA: Diagnosis not present

## 2017-11-14 DIAGNOSIS — E1122 Type 2 diabetes mellitus with diabetic chronic kidney disease: Secondary | ICD-10-CM | POA: Diagnosis not present

## 2017-11-14 DIAGNOSIS — E1165 Type 2 diabetes mellitus with hyperglycemia: Secondary | ICD-10-CM | POA: Diagnosis not present

## 2017-11-14 DIAGNOSIS — I129 Hypertensive chronic kidney disease with stage 1 through stage 4 chronic kidney disease, or unspecified chronic kidney disease: Secondary | ICD-10-CM | POA: Diagnosis not present

## 2017-11-14 DIAGNOSIS — G894 Chronic pain syndrome: Secondary | ICD-10-CM | POA: Diagnosis not present

## 2017-11-14 DIAGNOSIS — I2510 Atherosclerotic heart disease of native coronary artery without angina pectoris: Secondary | ICD-10-CM | POA: Diagnosis not present

## 2017-11-14 NOTE — Telephone Encounter (Signed)
Please advise 

## 2017-11-14 NOTE — Telephone Encounter (Signed)
Copied from CRM [id number]. Topic: Quick Communication - See Telephone Encounter >> Nov 14, 2017 10:53 AM Guinevere FerrariMorris, Ketura Sirek E, NT wrote: CRM for notification. See Telephone encounter for: Jon Gillslexis called in and wanted to see if the pt can have a verbal order for social work order to see what resources pt can have. She would like a call back [phone]  11/14/17.

## 2017-11-17 NOTE — Telephone Encounter (Signed)
I called [phone] asking for alexis but was told this is the wrong number. I am fine with these services, just not sure who I need to call. Not sure what next step is.

## 2017-11-20 DIAGNOSIS — E1165 Type 2 diabetes mellitus with hyperglycemia: Secondary | ICD-10-CM | POA: Diagnosis not present

## 2017-11-20 DIAGNOSIS — G894 Chronic pain syndrome: Secondary | ICD-10-CM | POA: Diagnosis not present

## 2017-11-20 DIAGNOSIS — I2510 Atherosclerotic heart disease of native coronary artery without angina pectoris: Secondary | ICD-10-CM | POA: Diagnosis not present

## 2017-11-20 DIAGNOSIS — N183 Chronic kidney disease, stage 3 (moderate): Secondary | ICD-10-CM | POA: Diagnosis not present

## 2017-11-20 DIAGNOSIS — E1122 Type 2 diabetes mellitus with diabetic chronic kidney disease: Secondary | ICD-10-CM | POA: Diagnosis not present

## 2017-11-20 DIAGNOSIS — I129 Hypertensive chronic kidney disease with stage 1 through stage 4 chronic kidney disease, or unspecified chronic kidney disease: Secondary | ICD-10-CM | POA: Diagnosis not present

## 2017-11-20 NOTE — Telephone Encounter (Signed)
Shanna CiscoAlexis Robinson, RN  with [facility] called back to inquire about the social worker.  she states they [facility] their [facility], and as long as you are ok with it, they will proceed with this. Her correct number is [phone] if you need to speak with her. Nothing further needed.

## 2017-11-20 NOTE — Telephone Encounter (Signed)
I am okay with this. Do not need to speak with Jon GillsAlexis. Thank you!

## 2017-11-24 ENCOUNTER — Other Ambulatory Visit: Payer: Self-pay | Admitting: Physician Assistant

## 2017-11-24 DIAGNOSIS — E1122 Type 2 diabetes mellitus with diabetic chronic kidney disease: Secondary | ICD-10-CM | POA: Diagnosis not present

## 2017-11-24 DIAGNOSIS — I129 Hypertensive chronic kidney disease with stage 1 through stage 4 chronic kidney disease, or unspecified chronic kidney disease: Secondary | ICD-10-CM | POA: Diagnosis not present

## 2017-11-24 DIAGNOSIS — I2510 Atherosclerotic heart disease of native coronary artery without angina pectoris: Secondary | ICD-10-CM | POA: Diagnosis not present

## 2017-11-24 DIAGNOSIS — N183 Chronic kidney disease, stage 3 (moderate): Secondary | ICD-10-CM | POA: Diagnosis not present

## 2017-11-24 DIAGNOSIS — E1165 Type 2 diabetes mellitus with hyperglycemia: Secondary | ICD-10-CM | POA: Diagnosis not present

## 2017-11-24 DIAGNOSIS — G894 Chronic pain syndrome: Secondary | ICD-10-CM | POA: Diagnosis not present

## 2017-11-28 ENCOUNTER — Ambulatory Visit: Payer: Medicare Other | Admitting: Physician Assistant

## 2017-11-28 DIAGNOSIS — M25511 Pain in right shoulder: Secondary | ICD-10-CM | POA: Diagnosis not present

## 2017-11-28 DIAGNOSIS — G894 Chronic pain syndrome: Secondary | ICD-10-CM | POA: Diagnosis not present

## 2017-11-28 DIAGNOSIS — M25512 Pain in left shoulder: Secondary | ICD-10-CM | POA: Diagnosis not present

## 2017-11-28 DIAGNOSIS — M545 Low back pain: Secondary | ICD-10-CM | POA: Diagnosis not present

## 2017-11-28 DIAGNOSIS — M5030 Other cervical disc degeneration, unspecified cervical region: Secondary | ICD-10-CM | POA: Diagnosis not present

## 2017-11-28 DIAGNOSIS — Z79899 Other long term (current) drug therapy: Secondary | ICD-10-CM | POA: Diagnosis not present

## 2017-11-29 ENCOUNTER — Telehealth: Payer: Self-pay | Admitting: Physician Assistant

## 2017-11-29 DIAGNOSIS — M797 Fibromyalgia: Secondary | ICD-10-CM | POA: Diagnosis not present

## 2017-11-29 DIAGNOSIS — E1122 Type 2 diabetes mellitus with diabetic chronic kidney disease: Secondary | ICD-10-CM | POA: Diagnosis not present

## 2017-11-29 DIAGNOSIS — E669 Obesity, unspecified: Secondary | ICD-10-CM | POA: Diagnosis not present

## 2017-11-29 DIAGNOSIS — F39 Unspecified mood [affective] disorder: Secondary | ICD-10-CM | POA: Diagnosis not present

## 2017-11-29 DIAGNOSIS — I129 Hypertensive chronic kidney disease with stage 1 through stage 4 chronic kidney disease, or unspecified chronic kidney disease: Secondary | ICD-10-CM | POA: Diagnosis not present

## 2017-11-29 DIAGNOSIS — I2510 Atherosclerotic heart disease of native coronary artery without angina pectoris: Secondary | ICD-10-CM | POA: Diagnosis not present

## 2017-11-29 DIAGNOSIS — G894 Chronic pain syndrome: Secondary | ICD-10-CM | POA: Diagnosis not present

## 2017-11-29 DIAGNOSIS — N183 Chronic kidney disease, stage 3 (moderate): Secondary | ICD-10-CM | POA: Diagnosis not present

## 2017-11-29 DIAGNOSIS — E1165 Type 2 diabetes mellitus with hyperglycemia: Secondary | ICD-10-CM | POA: Diagnosis not present

## 2017-11-29 DIAGNOSIS — Z955 Presence of coronary angioplasty implant and graft: Secondary | ICD-10-CM | POA: Diagnosis not present

## 2017-11-29 DIAGNOSIS — Z794 Long term (current) use of insulin: Secondary | ICD-10-CM | POA: Diagnosis not present

## 2017-11-29 DIAGNOSIS — R296 Repeated falls: Secondary | ICD-10-CM | POA: Diagnosis not present

## 2017-11-29 DIAGNOSIS — Z7982 Long term (current) use of aspirin: Secondary | ICD-10-CM | POA: Diagnosis not present

## 2017-11-29 NOTE — Telephone Encounter (Signed)
Pt called to states she was diagnosed with diabetes.ou tof test strips,needing med refill. fFR

## 2017-12-01 ENCOUNTER — Other Ambulatory Visit: Payer: Self-pay | Admitting: *Deleted

## 2017-12-01 ENCOUNTER — Telehealth: Payer: Self-pay | Admitting: Physician Assistant

## 2017-12-01 DIAGNOSIS — G894 Chronic pain syndrome: Secondary | ICD-10-CM | POA: Diagnosis not present

## 2017-12-01 DIAGNOSIS — I2510 Atherosclerotic heart disease of native coronary artery without angina pectoris: Secondary | ICD-10-CM | POA: Diagnosis not present

## 2017-12-01 DIAGNOSIS — E1122 Type 2 diabetes mellitus with diabetic chronic kidney disease: Secondary | ICD-10-CM | POA: Diagnosis not present

## 2017-12-01 DIAGNOSIS — I129 Hypertensive chronic kidney disease with stage 1 through stage 4 chronic kidney disease, or unspecified chronic kidney disease: Secondary | ICD-10-CM | POA: Diagnosis not present

## 2017-12-01 DIAGNOSIS — E1165 Type 2 diabetes mellitus with hyperglycemia: Secondary | ICD-10-CM | POA: Diagnosis not present

## 2017-12-01 DIAGNOSIS — N183 Chronic kidney disease, stage 3 (moderate): Secondary | ICD-10-CM | POA: Diagnosis not present

## 2017-12-01 MED ORDER — ACCU-CHEK SOFTCLIX LANCETS MISC: 0 refills | Status: DC

## 2017-12-01 MED ORDER — GLUCOSE BLOOD VI STRP: ORAL_STRIP | 0 refills | Status: DC

## 2017-12-01 NOTE — Telephone Encounter (Signed)
Copied from CRM [phone]. Topic: Quick Communication - See Telephone Encounter >> Dec 01, 2017  2:22 PM Elliot GaultBell, Tiffany M wrote: CRM for notification. See Telephone encounter for:   12/01/17.   Relation to UE:AVWUpt:self Call back number: [phone] Pharmacy: [facility] [address] - [place], [place] - [address] [phone] (Phone) [phone] (Fax)    Reason for call:  Patient requesting hydrocortisone 2 percent cream for dry skin around nose and dry skin around her hair line, please advise   Patient requesting ACCU-CHEK AVIVA PLUS test strip  and ACCU-CHEK SOFTCLIX LANCETS lancets,  please send to [facility], as per pharmacy please ensure Dx code, specific directions & specific # refills reflect on Rx due to patient insurance requirement.  (patient states please note all diabetic supplies go to [facility])

## 2017-12-01 NOTE — Telephone Encounter (Signed)
OK to refill hydrocortisone 2% cream

## 2017-12-01 NOTE — Telephone Encounter (Signed)
I have sent in accu-check.  Can we refill Cream

## 2017-12-02 ENCOUNTER — Other Ambulatory Visit: Payer: Self-pay | Admitting: Physician Assistant

## 2017-12-02 ENCOUNTER — Telehealth: Payer: Self-pay | Admitting: Physician Assistant

## 2017-12-02 DIAGNOSIS — E118 Type 2 diabetes mellitus with unspecified complications: Secondary | ICD-10-CM

## 2017-12-02 DIAGNOSIS — L219 Seborrheic dermatitis, unspecified: Secondary | ICD-10-CM

## 2017-12-02 MED ORDER — GLUCOSE BLOOD VI STRP: ORAL_STRIP | 0 refills | Status: AC

## 2017-12-02 MED ORDER — TRIAMCINOLONE ACETONIDE 0.025 % EX OINT: 1.0000 "application " | TOPICAL_OINTMENT | Freq: Two times a day (BID) | CUTANEOUS | 0 refills | Status: DC

## 2017-12-02 MED ORDER — ACCU-CHEK SOFTCLIX LANCETS MISC: 0 refills | Status: DC

## 2017-12-02 NOTE — Telephone Encounter (Signed)
Sent in cream, strips and lancets to pharmacy.

## 2017-12-02 NOTE — Telephone Encounter (Signed)
Please advise 

## 2017-12-02 NOTE — Telephone Encounter (Signed)
Went to refill cream but it is not on patient's list...maybe she is asking for an Rx

## 2017-12-02 NOTE — Telephone Encounter (Signed)
Copied from CRM #[id number]. [phone]: Quick Communication - Rx Refill/Question >> Dec 02, 2017  1:28 PM Stephannie LiSimmons, Polina Burmaster L, NT wrote: Has the patient contacted their pharmacy? {yes  (Agent: If no, request that the patient contact the pharmacy for the refill.) Preferred Pharmacy (with phone number or street name): [facility]  Agent: Please be advised that RX refills may take up to 3 business days. We ask that you follow-up with your pharmacy. Patient says the pharmacy needs a diagnosis code for test strips and the lancets please advise , [phone]

## 2017-12-03 ENCOUNTER — Telehealth: Payer: Self-pay | Admitting: Physician Assistant

## 2017-12-03 ENCOUNTER — Ambulatory Visit: Payer: Medicare Other | Admitting: Physician Assistant

## 2017-12-03 NOTE — Telephone Encounter (Signed)
Copied from CRM [id number]. Topic: Quick Communication - See Telephone Encounter >> Dec 03, 2017 10:32 AM Diana EvesHoyt, Maryann B wrote: CRM for notification. See Telephone encounter for:  Jon Gillslexis calling from [facility] she is checking on a recert she had faxed in for nursing orders for 1x a week for 5 weeks call back [phone].  12/03/17.

## 2017-12-04 ENCOUNTER — Telehealth: Payer: Self-pay | Admitting: Physician Assistant

## 2017-12-04 NOTE — Telephone Encounter (Signed)
Requesting refill of Adderall  LOV 10/28/17 with W. McVey

## 2017-12-04 NOTE — Telephone Encounter (Signed)
Copied from CRM [id number]. Topic: Quick Communication - Rx Refill/Question >> Dec 04, 2017 12:39 PM Yvonna Alanisobinson, Andra M wrote: Medication: Amphetamine-dextroamphetamine (ADDERALL) 20 MG tablet (Expired) Has the patient contacted their pharmacy? Yes.   (Agent: If no, request that the patient contact the pharmacy for the refill.) Preferred Pharmacy (with phone number or street name): [facility] [address] - [place], [place] - [address] [phone] (Phone) [phone]) Patient stated that she needs to come in and pick up this prescription ASAP. Agent: Please be advised that RX refills may take up to 3 business days. We ask that you follow-up with your pharmacy.

## 2017-12-05 NOTE — Telephone Encounter (Signed)
Verbal given 

## 2017-12-08 ENCOUNTER — Other Ambulatory Visit: Payer: Self-pay | Admitting: Physician Assistant

## 2017-12-08 DIAGNOSIS — E1165 Type 2 diabetes mellitus with hyperglycemia: Secondary | ICD-10-CM | POA: Diagnosis not present

## 2017-12-08 DIAGNOSIS — I2510 Atherosclerotic heart disease of native coronary artery without angina pectoris: Secondary | ICD-10-CM | POA: Diagnosis not present

## 2017-12-08 DIAGNOSIS — F0630 Mood disorder due to known physiological condition, unspecified: Secondary | ICD-10-CM

## 2017-12-08 DIAGNOSIS — E1122 Type 2 diabetes mellitus with diabetic chronic kidney disease: Secondary | ICD-10-CM | POA: Diagnosis not present

## 2017-12-08 DIAGNOSIS — G894 Chronic pain syndrome: Secondary | ICD-10-CM | POA: Diagnosis not present

## 2017-12-08 DIAGNOSIS — N183 Chronic kidney disease, stage 3 (moderate): Secondary | ICD-10-CM | POA: Diagnosis not present

## 2017-12-08 DIAGNOSIS — I129 Hypertensive chronic kidney disease with stage 1 through stage 4 chronic kidney disease, or unspecified chronic kidney disease: Secondary | ICD-10-CM | POA: Diagnosis not present

## 2017-12-08 MED ORDER — AMPHETAMINE-DEXTROAMPHETAMINE 20 MG PO TABS: 20.0000 mg | ORAL_TABLET | Freq: Two times a day (BID) | ORAL | 0 refills | Status: DC | PRN

## 2017-12-08 NOTE — Telephone Encounter (Signed)
Adderall sent to pharmacy. pls let pt know. TY!

## 2017-12-09 NOTE — Telephone Encounter (Signed)
LVM advising pt.

## 2017-12-10 ENCOUNTER — Other Ambulatory Visit: Payer: Self-pay | Admitting: Physician Assistant

## 2017-12-10 DIAGNOSIS — I10 Essential (primary) hypertension: Secondary | ICD-10-CM

## 2017-12-12 ENCOUNTER — Ambulatory Visit (INDEPENDENT_AMBULATORY_CARE_PROVIDER_SITE_OTHER): Payer: Medicare Other | Admitting: Physician Assistant

## 2017-12-12 ENCOUNTER — Encounter: Payer: Self-pay | Admitting: Physician Assistant

## 2017-12-12 ENCOUNTER — Other Ambulatory Visit: Payer: Self-pay

## 2017-12-12 VITALS — BP 120/70 | HR 74 | Temp 97.8°F | Resp 16 | Ht 62.0 in | Wt 171.4 lb

## 2017-12-12 DIAGNOSIS — Z79899 Other long term (current) drug therapy: Secondary | ICD-10-CM | POA: Diagnosis not present

## 2017-12-12 DIAGNOSIS — F411 Generalized anxiety disorder: Secondary | ICD-10-CM | POA: Diagnosis not present

## 2017-12-12 DIAGNOSIS — G894 Chronic pain syndrome: Secondary | ICD-10-CM

## 2017-12-12 MED ORDER — VENLAFAXINE HCL 75 MG PO TABS: 75.0000 mg | ORAL_TABLET | Freq: Every day | ORAL | 1 refills | Status: DC

## 2017-12-12 MED ORDER — DIAZEPAM 5 MG PO TABS: 5.0000 mg | ORAL_TABLET | Freq: Two times a day (BID) | ORAL | 1 refills | Status: DC | PRN

## 2017-12-12 NOTE — Patient Instructions (Addendum)
Start vyvanse next week in place of adderall.  You will receive a phone call to schedule an appointment with physical therapy.   Come back and see me in 3-4 weeks.  IF you received an x-ray today, you will receive an invoice from [facility]. Please contact [facility] at [phone] with questions or concerns regarding your invoice.   IF you received labwork today, you will receive an invoice from [facility]. Please contact [facility] at [phone] with questions or concerns regarding your invoice.   Our billing staff will not be able to assist you with questions regarding bills from these companies.  You will be contacted with the lab results as soon as they are available. The fastest way to get your results is to activate your My Chart account. Instructions are located on the last page of this paperwork. If you have not heard from us regarding the results in 2 weeks, please contact this office.

## 2017-12-12 NOTE — Progress Notes (Signed)
[name]  MRN: [id number] DOB: [birth year]  PCP: Dorise Hiss, PA-C  Subjective:  Pt is a 77 year old female who presents to clinic for medication management.   Does not like times released Effexor. Would like standard released.  Prozac - she is tapering off of this currently. Takes this in the morning. 75m once daily. Would like to go back on diazepam PRN.     She spoke with pain management provider, Dr. RNelva Bush and would like to start cutting back on on hydrocodone. She will eliminate one pill every three days. She will switch to Aleve. She is now taking 3 (rather than 4) pills daily.  No more home PT.  She is using TENs unit on arms.  [facility]. She plans on Vyvanse next week. adderall lasts about three hours.   She reports doing more things for herself like dressing and opening food packages.   Review of Systems  Cardiovascular: Negative for chest pain, palpitations and leg swelling.  Musculoskeletal: Positive for arthralgias, gait problem and myalgias.  Psychiatric/Behavioral: Positive for dysphoric mood.    Patient Active Problem List   Diagnosis Date Noted  . Hyperglycemia 09/25/2017  . Type 2 diabetes mellitus with complication, without long-term current use of insulin ([facility] 04/24/2017  . ADD (attention deficit disorder) 12/30/2016  . Narcotic addiction ([place] 12/05/2014  . Excessive daytime sleepiness 12/05/2014  . Alteration in psychomotor activity 12/05/2014  . Depression with somatization 12/05/2014  . Hypersomnia, persistent 09/10/2013  . Obesity, unspecified 09/10/2013  . Chronic pain syndrome 09/10/2013  . Depression 07/31/2013  . Medication intolerance 07/19/2013  . Anxiety 07/19/2013  . CAD (coronary artery disease) status post stent to distal right coronary artery 2006 06/20/2013  . Neurocardiogenic syncope 06/20/2013  . Hyperlipidemia 06/20/2013    Current Outpatient Medications  on File Prior to Visit  Medication Sig Dispense Refill  . ACCU-CHEK SOFTCLIX LANCETS lancets USE TO TEST FOUR TIMES DAILY 100 each 0  . Alfalfa 250 MG TABS Take 1 tablet by mouth every morning.    .Marland Kitchenamphetamine-dextroamphetamine (ADDERALL) 20 MG tablet Take 1 tablet (20 mg total) by mouth 2 (two) times daily as needed. 60 tablet 0  . aspirin 81 MG tablet Take 81 mg by mouth daily.    .Marland Kitchenatenolol (TENORMIN) 25 MG tablet TAKE 1 TABLET BY MOUTH IN THE MORNING AND 2 IN THE EVENING 270 tablet 0  . blood glucose meter kit and supplies Dispense based on patient and insurance preference. Use up to four times daily as directed. (FOR ICD-9 250.00, 250.01). 1 each 0  . Cholecalciferol (VITAMIN D3) 5000 units TBDP Take 1 capsule by mouth daily.    . CHROMIUM ASPARTATE PO Take 1 tablet by mouth every morning.    .Verneita GriffesBark POWD Take 1 Dose by mouth daily.    . DHA-EPA-Coenzyme Q10-Vitamin E (GNP COQ-10 & FISH OIL) 120-180-50-30 CAPS Take 1 tablet by mouth daily.    . fexofenadine (ALLEGRA) 180 MG tablet Take 180 mg by mouth daily as needed.     .Marland KitchenFLUoxetine (PROZAC) 20 MG capsule TAKE 1 CAPSULE BY MOUTH TWICE DAILY 60 capsule 0  . glucose 4 GM chewable tablet Chew 1 tablet by mouth as needed for low blood sugar.    .Marland Kitchenglucose blood (ACCU-CHEK AVIVA PLUS) test strip USE TO TEST FOUR TIMES DAILY 100 each 0  . hydrochlorothiazide (MICROZIDE) 12.5 MG capsule Take 1 capsule (12.5 mg  total) by mouth daily. 30 capsule 2  . Insulin Glargine (LANTUS SOLOSTAR) 100 UNIT/ML Solostar Pen Inject 24 Units into the skin daily at 10 pm. 5 pen PRN  . Insulin Syringe-Needle U-100 (INSULIN SYRINGE .5CC/31GX5/16") 31G X 5/16" 0.5 ML MISC Use as directed 100 each 0  . levothyroxine (SYNTHROID, LEVOTHROID) 50 MCG tablet Take 1 tablet (50 mcg total) by mouth daily before breakfast. 30 tablet 5  . lisinopril (PRINIVIL,ZESTRIL) 20 MG tablet TAKE 1 TABLET BY MOUTH IN THE MORNING AND 1/2 (ONE-HALF) AT BEDTIME 135 tablet 0  .  magnesium oxide (MAG-OX) 400 MG tablet Take 400 mg by mouth 2 (two) times daily.    . metFORMIN (GLUCOPHAGE) 500 MG tablet Take 1 tablet (500 mg total) by mouth 2 (two) times daily with a meal. Wk1: 1/2 tab 2x/day; Wk2: 1 tab am, 1/2 tab pm; Wk3: 2 tabs 2x/day. 180 tablet 3  . Misc. Devices (HUGO ROLLING WALKER ELITE) MISC 1 Units by Does not apply route daily as needed. 1 each 0  . potassium chloride (K-DUR,KLOR-CON) 10 MEQ tablet TAKE 1 TAB (10 MEQ TOTAL) BY MOUTH AS NEEDED (ONLY TAKES IF SHE DOES NOT EAT POTASSIUM RICH FOODS) 30 tablet 0  . triamcinolone (KENALOG) 0.025 % ointment Apply 1 application topically 2 (two) times daily. 30 g 0  . HYDROcodone-acetaminophen (NORCO) 10-325 MG per tablet Take 1-2 tablets by mouth every 4 (four) hours as needed for moderate pain.     Marland Kitchen lisdexamfetamine (VYVANSE) 30 MG capsule Take 1 capsule (30 mg total) by mouth daily. (Patient not taking: Reported on 12/12/2017) 30 capsule 0  . venlafaxine XR (EFFEXOR-XR) 75 MG 24 hr capsule Take 1 capsule (75 mg total) by mouth daily with breakfast. (Patient not taking: Reported on 12/12/2017) 30 capsule 0   No current facility-administered medications on file prior to visit.     Allergies  Allergen Reactions  . Cardizem [Diltiazem Hcl]     nausea  . Codeine     Feeling weird   . Coreg [Carvedilol]   . Demerol [Meperidine]     Per pt disoriented  . Erythromycin     Very sick  . Inderal [Propranolol]     Made me cry  . Procardia [Nifedipine]   . Wellbutrin [Bupropion]   . Zoloft [Sertraline Hcl]     Could not talk     Objective:  BP 120/70   Pulse 74   Temp 97.8 F (36.6 C) (Oral)   Resp 16   Ht '5\' 2"'  (1.575 m)   Wt 171 lb 6.4 oz (77.7 kg)   SpO2 98%   BMI 31.35 kg/m   Physical Exam  Constitutional: She is oriented to person, place, and time and well-developed, well-nourished, and in no distress. No distress.  Cardiovascular: Normal rate, regular rhythm and normal heart sounds.    Musculoskeletal:       Right shoulder: She exhibits decreased range of motion.       Left shoulder: She exhibits decreased range of motion.  Neurological: She is alert and oriented to person, place, and time. GCS score is 15.  Skin: Skin is warm and dry.  Psychiatric: Mood, memory, affect and judgment normal.  Vitals reviewed.   Assessment and Plan :  1. Encounter for medication management 2. Chronic pain syndrome - Ambulatory referral to Physical Therapy - I feel Laelia would benefit from dry needling and virtual reality pain management. Will refer.  3. Generalized anxiety disorder - diazepam (VALIUM) 5 MG tablet;  Take 1 tablet (5 mg total) by mouth every 12 (twelve) hours as needed for anxiety.  Dispense: 60 tablet; Refill: 1 - venlafaxine (EFFEXOR) 75 MG tablet; Take 1 tablet (75 mg total) by mouth daily.  Dispense: 30 tablet; Refill: 1 - Stop effexor XR and start Effexor SR. Will try Vyvanse next week. Stop adderall. She is cutting back on Norco under surveillance of pain management Dr Nelva Bush.  OK to take Valium PRN.   Mercer Pod, PA-C  [facility] 12/12/2017 5:27 PM

## 2017-12-16 DIAGNOSIS — G894 Chronic pain syndrome: Secondary | ICD-10-CM | POA: Diagnosis not present

## 2017-12-16 DIAGNOSIS — E1122 Type 2 diabetes mellitus with diabetic chronic kidney disease: Secondary | ICD-10-CM | POA: Diagnosis not present

## 2017-12-16 DIAGNOSIS — I2510 Atherosclerotic heart disease of native coronary artery without angina pectoris: Secondary | ICD-10-CM | POA: Diagnosis not present

## 2017-12-16 DIAGNOSIS — I129 Hypertensive chronic kidney disease with stage 1 through stage 4 chronic kidney disease, or unspecified chronic kidney disease: Secondary | ICD-10-CM | POA: Diagnosis not present

## 2017-12-16 DIAGNOSIS — N183 Chronic kidney disease, stage 3 (moderate): Secondary | ICD-10-CM | POA: Diagnosis not present

## 2017-12-16 DIAGNOSIS — E1165 Type 2 diabetes mellitus with hyperglycemia: Secondary | ICD-10-CM | POA: Diagnosis not present

## 2017-12-24 ENCOUNTER — Telehealth: Payer: Self-pay | Admitting: Physician Assistant

## 2017-12-24 NOTE — Telephone Encounter (Signed)
Copied from CRM [phone]. Topic: Quick Communication - See Telephone Encounter >> Dec 24, 2017  5:06 PM [name], Rosey Batheresa D wrote: CRM for notification. See Telephone encounter for: 12/24/17. Patient called and said that she would like a higher doze of her lisdexamfetamine (VYVANSE) 30 MG capsule. She would like to take 2 capsule instead of one. Please call patient back.

## 2017-12-25 NOTE — Telephone Encounter (Signed)
Received a fax today re: vyvanse.  [facility].  To be faxed thereafter.

## 2017-12-26 ENCOUNTER — Telehealth: Payer: Self-pay

## 2017-12-26 NOTE — Telephone Encounter (Signed)
[facility] today [facility] part D.  Medication is a Tier exception and does not qualify for a lower cost.  There are no covered brand drugs available on the formulary list.  Note sent to the provider.

## 2017-12-26 NOTE — Telephone Encounter (Signed)
Pt calling in regarding the denial from Medicare. She states [facility] and to [facility] and get [facility]. Also wanting to see if she can take more of the Vyvanse than she has been taking.

## 2017-12-29 DIAGNOSIS — I2510 Atherosclerotic heart disease of native coronary artery without angina pectoris: Secondary | ICD-10-CM | POA: Diagnosis not present

## 2017-12-29 DIAGNOSIS — E1122 Type 2 diabetes mellitus with diabetic chronic kidney disease: Secondary | ICD-10-CM | POA: Diagnosis not present

## 2017-12-29 DIAGNOSIS — N183 Chronic kidney disease, stage 3 (moderate): Secondary | ICD-10-CM | POA: Diagnosis not present

## 2017-12-29 DIAGNOSIS — I129 Hypertensive chronic kidney disease with stage 1 through stage 4 chronic kidney disease, or unspecified chronic kidney disease: Secondary | ICD-10-CM | POA: Diagnosis not present

## 2017-12-29 DIAGNOSIS — E1165 Type 2 diabetes mellitus with hyperglycemia: Secondary | ICD-10-CM | POA: Diagnosis not present

## 2017-12-29 DIAGNOSIS — G894 Chronic pain syndrome: Secondary | ICD-10-CM | POA: Diagnosis not present

## 2017-12-29 NOTE — Telephone Encounter (Signed)
Call to patient to advise that [facility] only [facility].  She stated that she decided not to take the Vyvanse because she tried it for one day and could not tell any difference.  She discussed options with the pharmacist and decided to go back on the Adderall.  She has been taking the Effexor and Adderall. She feels much better.  Has an appt with Whitney on Fri, Feb 8th and will discuss with her then.  [facility] to [facility] at that time.  She asked me to make Whitney aware of our conversation. I am forwarding this message to [place] [facility].

## 2018-01-02 ENCOUNTER — Ambulatory Visit: Payer: Self-pay | Admitting: *Deleted

## 2018-01-02 ENCOUNTER — Ambulatory Visit: Payer: Medicare Other | Admitting: Physician Assistant

## 2018-01-02 ENCOUNTER — Ambulatory Visit: Payer: Medicare Other

## 2018-01-02 NOTE — Telephone Encounter (Signed)
Patient is calling to cancel her appointments this afternoon because she is not feeling well enough to come in. The agent was concerned about her mental health because the patient sounded so depressed.  Patient is having severe pain in her arms- with knots so bad she can not move them.  Patient is also having headache with eye pain. Patient is using heat and ice along with her pain medication. Talked to patient about her pain control. She states she is afraid to take a lot of medication because of the news on opioid use. I explained to patient that she has a chronic problem and that she is closely monitored. She states she is not taking the prescribed amount of pain medication and that her pain management doctor had asked her if she needed to increase it at her last appointment. Advised patient that she is allowed to take her prescribed dosing and she should if she needs it. She realizes that she may be doing herself more harm than good by not treating her pain. She is going to contact her provider to discuss safety with increasing her dose she is taking now. She is going to increase fluids and monitor her BP/P with the increase.  She is in better sprits at end of call and she promises to call and reschedule her appointment with W McVey.

## 2018-01-07 ENCOUNTER — Telehealth: Payer: Self-pay | Admitting: Physician Assistant

## 2018-01-07 DIAGNOSIS — N183 Chronic kidney disease, stage 3 (moderate): Secondary | ICD-10-CM | POA: Diagnosis not present

## 2018-01-07 DIAGNOSIS — G894 Chronic pain syndrome: Secondary | ICD-10-CM | POA: Diagnosis not present

## 2018-01-07 DIAGNOSIS — M25561 Pain in right knee: Secondary | ICD-10-CM | POA: Diagnosis not present

## 2018-01-07 DIAGNOSIS — E1122 Type 2 diabetes mellitus with diabetic chronic kidney disease: Secondary | ICD-10-CM | POA: Diagnosis not present

## 2018-01-07 DIAGNOSIS — M545 Low back pain: Secondary | ICD-10-CM | POA: Diagnosis not present

## 2018-01-07 DIAGNOSIS — I2510 Atherosclerotic heart disease of native coronary artery without angina pectoris: Secondary | ICD-10-CM | POA: Diagnosis not present

## 2018-01-07 DIAGNOSIS — M25562 Pain in left knee: Secondary | ICD-10-CM | POA: Diagnosis not present

## 2018-01-07 DIAGNOSIS — E1165 Type 2 diabetes mellitus with hyperglycemia: Secondary | ICD-10-CM | POA: Diagnosis not present

## 2018-01-07 DIAGNOSIS — M6281 Muscle weakness (generalized): Secondary | ICD-10-CM | POA: Diagnosis not present

## 2018-01-07 DIAGNOSIS — I129 Hypertensive chronic kidney disease with stage 1 through stage 4 chronic kidney disease, or unspecified chronic kidney disease: Secondary | ICD-10-CM | POA: Diagnosis not present

## 2018-01-07 NOTE — Telephone Encounter (Signed)
Copied from CRM [phone]. Topic: Quick Communication - See Telephone Encounter >> Jan 07, 2018 10:48 AM Landry MellowFoltz, Melissa J wrote: CRM for notification. See Telephone encounter for:   01/07/18. Pt calling to ask provider if she can bump up the  venlafaxine (EFFEXOR) 75 MG tabletto the next dose level.  Pharm is [facility]  Pt would like prozac to be taken off list  Pt is also asking about switching from lancet device  to a round DEXCOM G5 - someone from the hosp told her she could probably handle this device better.

## 2018-01-07 NOTE — Telephone Encounter (Signed)
See pt. Requests. Last OV 12/12/17. Thanks.

## 2018-01-07 NOTE — Telephone Encounter (Signed)
Pt is also asking about a round disc to inject insulin. She does not know the name of this medication -  She had medication follow up appt on March 1  cb is [phone]

## 2018-01-08 NOTE — Telephone Encounter (Signed)
Pt called back about this today, she states she will be out of insulin on Sunday and wants to know if we can expedite this

## 2018-01-09 ENCOUNTER — Emergency Department (HOSPITAL_COMMUNITY): Payer: Medicare Other

## 2018-01-09 ENCOUNTER — Emergency Department (HOSPITAL_COMMUNITY)
Admission: EM | Admit: 2018-01-09 | Discharge: 2018-01-09 | Disposition: A | Discharge status: Home / Self Care | Payer: Medicare Other | Attending: Emergency Medicine | Admitting: Emergency Medicine

## 2018-01-09 ENCOUNTER — Other Ambulatory Visit: Payer: Self-pay

## 2018-01-09 DIAGNOSIS — Z794 Long term (current) use of insulin: Secondary | ICD-10-CM | POA: Insufficient documentation

## 2018-01-09 DIAGNOSIS — R079 Chest pain, unspecified: Secondary | ICD-10-CM | POA: Diagnosis not present

## 2018-01-09 DIAGNOSIS — Z7982 Long term (current) use of aspirin: Secondary | ICD-10-CM | POA: Insufficient documentation

## 2018-01-09 DIAGNOSIS — R1084 Generalized abdominal pain: Secondary | ICD-10-CM | POA: Diagnosis not present

## 2018-01-09 DIAGNOSIS — I10 Essential (primary) hypertension: Secondary | ICD-10-CM | POA: Insufficient documentation

## 2018-01-09 DIAGNOSIS — M546 Pain in thoracic spine: Secondary | ICD-10-CM

## 2018-01-09 DIAGNOSIS — I2510 Atherosclerotic heart disease of native coronary artery without angina pectoris: Secondary | ICD-10-CM | POA: Diagnosis not present

## 2018-01-09 DIAGNOSIS — J90 Pleural effusion, not elsewhere classified: Secondary | ICD-10-CM | POA: Diagnosis not present

## 2018-01-09 DIAGNOSIS — M62830 Muscle spasm of back: Secondary | ICD-10-CM | POA: Diagnosis not present

## 2018-01-09 DIAGNOSIS — I491 Atrial premature depolarization: Secondary | ICD-10-CM | POA: Diagnosis not present

## 2018-01-09 DIAGNOSIS — E119 Type 2 diabetes mellitus without complications: Secondary | ICD-10-CM | POA: Insufficient documentation

## 2018-01-09 DIAGNOSIS — R109 Unspecified abdominal pain: Secondary | ICD-10-CM | POA: Insufficient documentation

## 2018-01-09 LAB — COMPREHENSIVE METABOLIC PANEL
ALT: 18 U/L (ref 14–54)
AST: 42 U/L — AB (ref 15–41)
Albumin: 3.3 g/dL — ABNORMAL LOW (ref 3.5–5.0)
Alkaline Phosphatase: 88 U/L (ref 38–126)
Anion gap: 12 (ref 5–15)
BILIRUBIN TOTAL: 1.3 mg/dL — AB (ref 0.3–1.2)
BUN: 28 mg/dL — AB (ref 6–20)
CO2: 21 mmol/L — ABNORMAL LOW (ref 22–32)
CREATININE: 0.78 mg/dL (ref 0.44–1.00)
Calcium: 9.5 mg/dL (ref 8.9–10.3)
Chloride: 104 mmol/L (ref 101–111)
GFR calc Af Amer: >60 mL/min (ref >60)
Glucose, Bld: 171 mg/dL — ABNORMAL HIGH (ref 65–99)
POTASSIUM: 4.3 mmol/L (ref 3.5–5.1)
Sodium: 137 mmol/L (ref 135–145)
TOTAL PROTEIN: 6.3 g/dL — AB (ref 6.5–8.1)

## 2018-01-09 LAB — CBC WITH DIFFERENTIAL/PLATELET
BASOS ABS: 0.1 10*3/uL (ref 0.0–0.1)
Basophils Relative: 1 %
Eosinophils Absolute: 0.3 10*3/uL (ref 0.0–0.7)
Eosinophils Relative: 4 %
HEMATOCRIT: 33.9 % — AB (ref 36.0–46.0)
Hemoglobin: 10.9 g/dL — ABNORMAL LOW (ref 12.0–15.0)
LYMPHS ABS: 1.6 10*3/uL (ref 0.7–4.0)
LYMPHS PCT: 27 %
MCH: 26.8 pg (ref 26.0–34.0)
MCHC: 32.2 g/dL (ref 30.0–36.0)
MCV: 83.5 fL (ref 78.0–100.0)
Monocytes Absolute: 0.6 10*3/uL (ref 0.1–1.0)
Monocytes Relative: 10 %
NEUTROS ABS: 3.5 10*3/uL (ref 1.7–7.7)
Neutrophils Relative %: 58 %
Platelets: 280 10*3/uL (ref 150–400)
RBC: 4.06 MIL/uL (ref 3.87–5.11)
RDW: 13.2 % (ref 11.5–15.5)
WBC: 6 10*3/uL (ref 4.0–10.5)

## 2018-01-09 LAB — LIPASE, BLOOD: Lipase: 30 U/L (ref 11–51)

## 2018-01-09 LAB — D-DIMER, QUANTITATIVE: D-Dimer, Quant: 1.36 ug/mL-FEU — ABNORMAL HIGH (ref 0.00–0.50)

## 2018-01-09 LAB — I-STAT TROPONIN, ED
TROPONIN I, POC: 0.01 ng/mL (ref 0.00–0.08)
Troponin i, poc: 0 ng/mL (ref 0.00–0.08)

## 2018-01-09 MED ORDER — METHOCARBAMOL 500 MG PO TABS: 500.0000 mg | ORAL_TABLET | Freq: Two times a day (BID) | ORAL | 0 refills | Status: DC | PRN

## 2018-01-09 MED ORDER — DICLOFENAC SODIUM 1 % TD GEL: 4.0000 g | Freq: Four times a day (QID) | TRANSDERMAL | 0 refills | Status: DC

## 2018-01-09 MED ORDER — LIDOCAINE 5 % EX PTCH: 1.0000 | MEDICATED_PATCH | CUTANEOUS | 0 refills | Status: DC

## 2018-01-09 MED ORDER — ONDANSETRON HCL 4 MG/2ML IJ SOLN
4.0000 mg | Freq: Once | INTRAMUSCULAR | Status: AC
  Administered 2018-01-09: 4 mg via INTRAVENOUS
  Filled 2018-01-09: qty 2

## 2018-01-09 MED ORDER — METHOCARBAMOL 1000 MG/10ML IJ SOLN
500.0000 mg | Freq: Once | INTRAMUSCULAR | Status: AC
  Administered 2018-01-09: 500 mg via INTRAVENOUS
  Filled 2018-01-09: qty 5

## 2018-01-09 MED ORDER — METHOCARBAMOL 1000 MG/10ML IJ SOLN: 500.0000 mg | Freq: Once | INTRAMUSCULAR | Status: DC

## 2018-01-09 MED ORDER — MORPHINE SULFATE (PF) 4 MG/ML IV SOLN
4.0000 mg | Freq: Once | INTRAVENOUS | Status: AC
  Administered 2018-01-09: 4 mg via INTRAVENOUS
  Filled 2018-01-09: qty 1

## 2018-01-09 MED ORDER — IOPAMIDOL (ISOVUE-370) INJECTION 76%
INTRAVENOUS | Status: AC
  Administered 2018-01-09: 100 mL
  Filled 2018-01-09: qty 100

## 2018-01-09 MED ORDER — LIDOCAINE 5 % EX PTCH
1.0000 | MEDICATED_PATCH | CUTANEOUS | Status: DC
  Administered 2018-01-09: 1 via TRANSDERMAL
  Filled 2018-01-09: qty 1

## 2018-01-09 NOTE — Telephone Encounter (Signed)
Reviewed pt chart.  Shows pt went to ED this am 9:51am.  Fax message - pt does not like triamcinolone ointment given 12/02/2017

## 2018-01-09 NOTE — ED Triage Notes (Signed)
Patient c/o left flank pain and pain between shoulder blades starting this am. Patient took 324 asa and pain went from 3 to 2. EMS gave 1 nitro and pain reduced to 0.

## 2018-01-09 NOTE — Telephone Encounter (Signed)
Call placed to patient regarding changes in medications. No answer on patient phone, left message advising patient to be seen in the office to discuss.

## 2018-01-09 NOTE — ED Notes (Signed)
Patient transported to X-ray 

## 2018-01-09 NOTE — Discharge Instructions (Signed)
Try applying the Voltaren gel to the area 3-4 times daily for the next 5 days.  This should help with pain and inflammation.  Continue to use lidocaine patches.  I have also started a low-dose muscle relaxant.  It is important to note that this could cause drowsiness, so be careful when taking it.  Try not to take it with high-dose pain medications.  Otherwise, you can take Tylenol and Advil as needed for pain in addition to your medications.

## 2018-01-09 NOTE — ED Notes (Signed)
Patient transported to CT 

## 2018-01-09 NOTE — ED Provider Notes (Signed)
[facility] Provider Note   CSN: [id number] Arrival date & time: 01/09/18  0814     History   Chief Complaint Chief Complaint  Patient presents with  . Back Pain    HPI [name] is a 77 y.o. female.  HPI   77 year old female with extensive past medical history as below including chronic pain, coronary disease, hypertension, hyperlipidemia, here with back pain.  The patient states she awoke this morning with a severe, sharp, spasm-like pain in her right thoracic paraspinal area.  She does admit that she recently increased her physical activity at physical therapy.  The pain felt like a spasm and swelling of her back muscles.  The husband applied direct pressure which seemed to temporarily improve it.  However, she then had recurrence of the pain and subsequently called EMS.  She had taken an aspirin prior to arrival.  She was given a nitroglycerin but states the pain was already significantly improved and largely resolved after aspirin.  On my interview here, she does report a sharp, stabbing pain in the same area with movement, but no pain at rest.  She denies any associated shortness of breath, nausea, diaphoresis.  She has a history of recurrent muscle spasms and chronic back pain with similar presentations.  Denies any focal numbness, weakness, headache, or other symptoms.  No recent fevers or chills.  No recent medication changes.  Past Medical History:  Diagnosis Date  . Abdominal discomfort    "due to medication intolerance"  . CAD (coronary artery disease)    previous stent  . Chronic pain syndrome 09/10/2013   Dr Nelva Bush,   . Excessive daytime sleepiness 12/05/2014   Patient has not been seen for a sleep study as ordered, and used adderall to keep alert in daytime.  She agreed  In a contract not to have any scheduled medication for pain treatment from [facility] and will not receive refills for Adderall, which was initiated by dr Orland Penman, PCP>    . Fall   . Hyperlipemia   . Hypersomnia, persistent 09/10/2013   Patient on  Stimulants .  Marland Kitchen Hypertension   . Narcotic addiction ([place]) 12/05/2014  . Obesity, unspecified 09/10/2013  . Shoulder joint pain    both shoulders    Patient Active Problem List   Diagnosis Date Noted  . Hyperglycemia 09/25/2017  . Type 2 diabetes mellitus with complication, without long-term current use of insulin ([place]) 04/24/2017  . ADD (attention deficit disorder) 12/30/2016  . Narcotic addiction ([place]) 12/05/2014  . Excessive daytime sleepiness 12/05/2014  . Alteration in psychomotor activity 12/05/2014  . Depression with somatization 12/05/2014  . Hypersomnia, persistent 09/10/2013  . Obesity, unspecified 09/10/2013  . Chronic pain syndrome 09/10/2013  . Depression 07/31/2013  . Medication intolerance 07/19/2013  . Anxiety 07/19/2013  . CAD (coronary artery disease) status post stent to distal right coronary artery 2006 06/20/2013  . Neurocardiogenic syncope 06/20/2013  . Hyperlipidemia 06/20/2013    Past Surgical History:  Procedure Laterality Date  . ANGIOPLASTY  03/01/2002   cutting balloon mid RCA  . BACK SURGERY    . CARDIAC CATHETERIZATION  01/26/2007   mild diffuse CAD  . CARDIAC CATHETERIZATION  10/23/2009   nonobstructive CAD,60% prox RCA,50% prox LAD, 50% mid ramus  . CORONARY STENT PLACEMENT  03/15/2005   distal RCA  . NEPHRECTOMY      OB History    Gravida Para Term Preterm AB Living   2 2 2  2   SAB TAB Ectopic Multiple Live Births                   Home Medications    Prior to Admission medications   Medication Sig Start Date End Date Taking? Authorizing Provider  ACCU-CHEK SOFTCLIX LANCETS lancets USE TO TEST FOUR TIMES DAILY 12/02/17  Yes McVey, Gelene Mink, PA-C  Alfalfa 250 MG TABS Take 1 tablet by mouth every morning.   Yes [provider]  amphetamine-dextroamphetamine (ADDERALL) 20 MG tablet Take 1 tablet (20 mg total) by mouth 2 (two) times  daily as needed. 12/08/17 01/09/18 Yes McVey, Gelene Mink, PA-C  aspirin 81 MG tablet Take 81 mg by mouth daily.   Yes [provider]  atenolol (TENORMIN) 25 MG tablet TAKE 1 TABLET BY MOUTH IN THE MORNING AND 2 IN THE EVENING Patient taking differently: 78m by mouth daily at 12noon, and 538mby mouth in evening 12/10/17  Yes McVey, ElGelene MinkPA-C  blood glucose meter kit and supplies Dispense based on patient and insurance preference. Use up to four times daily as directed. (FOR ICD-9 250.00, 250.01). 09/27/17  Yes LaOswald HillockMD  Cholecalciferol (VITAMIN D3) 5000 units TBDP Take 1 capsule by mouth daily.   Yes [provider]  CHROMIUM ASPARTATE PO Take 1 tablet by mouth every morning.   Yes [provider]  Cinnamon Bark POWD Take 1 Dose by mouth daily.   Yes [provider]  dextromethorphan (DELSYM) 30 MG/5ML liquid Take 30 mg by mouth daily as needed for cough.   Yes [provider]  DHA-EPA-Coenzyme Q10-Vitamin E (GNP COQ-10 & FISH OIL) 120-180-50-30 CAPS Take 3 tablets by mouth daily.    Yes [provider]  diazepam (VALIUM) 5 MG tablet Take 1 tablet (5 mg total) by mouth every 12 (twelve) hours as needed for anxiety. 12/12/17 01/11/18 Yes McVey, ElGelene MinkPA-C  diphenhydrAMINE (BENADRYL) 25 MG tablet Take 25 mg by mouth every 6 (six) hours as needed for itching.   Yes [provider]  fexofenadine (ALLEGRA) 180 MG tablet Take 180 mg by mouth daily as needed for allergies.    Yes [provider]  glucose 4 GM chewable tablet Chew 1 tablet by mouth as needed for low blood sugar.   Yes [provider]  glucose blood (ACCU-CHEK AVIVA PLUS) test strip USE TO TEST FOUR TIMES DAILY 12/02/17  Yes McVey, ElGelene MinkPA-C  hydrochlorothiazide (MICROZIDE) 12.5 MG capsule Take 1 capsule (12.5 mg total) by mouth daily. Patient taking differently: Take 25 mg by mouth daily.  09/28/17  Yes LaOswald HillockMD  HYDROcodone-acetaminophen (NORCO) 10-325 MG per tablet Take 1-2 tablets by mouth every 4 (four) hours as needed for moderate pain.  09/28/14  Yes [provider]  ibuprofen (ADVIL,MOTRIN) 200 MG tablet Take 200 mg by mouth every 6 (six) hours as needed for moderate pain.   Yes [provider]  Insulin Glargine (LANTUS SOLOSTAR) 100 UNIT/ML Solostar Pen Inject 24 Units into the skin daily at 10 pm. 10/24/17  Yes McVey, ElGelene MinkPA-C  Insulin Syringe-Needle U-100 (INSULIN SYRINGE .5CC/31GX5/16") 31G X 5/16" 0.5 ML MISC Use as directed 09/27/17  Yes LaDarrick MeigsGaMarge DuncansMD  levothyroxine (SYNTHROID, LEVOTHROID) 50 MCG tablet Take 1 tablet (50 mcg total) by mouth daily before breakfast. 03/07/17  Yes McVey, ElGelene MinkPA-C  lisinopril (PRINIVIL,ZESTRIL) 20 MG tablet TAKE 1 TABLET BY MOUTH IN THE MORNING AND 1/2 (ONE-HALF) AT  BEDTIME Patient taking differently: 8m by mouth every morning, 180mby mouth every evening 08/02/17  Yes McVey, ElGelene MinkPA-C  magnesium oxide (MAG-OX) 400 MG tablet Take 400 mg by mouth 2 (two) times daily.   Yes [provider]  metFORMIN (GLUCOPHAGE) 500 MG tablet Take 1 tablet (500 mg total) by mouth 2 (two) times daily with a meal. Wk1: 1/2 tab 2x/day; Wk2: 1 tab am, 1/2 tab pm; Wk3: 2 tabs 2x/day. 09/27/17  Yes LaOswald HillockMD  Misc. Devices (HUGO ROLLING WALKER ELITE) MISC 1 Units by Does not apply route daily as needed. 11/29/16  Yes McVey, ElGelene MinkPA-C  potassium chloride (K-DUR,KLOR-CON) 10 MEQ tablet TAKE 1 TAB (10 MEQ TOTAL) BY MOUTH AS NEEDED (ONLY TAKES IF SHE DOES NOT EAT POTASSIUM RICH FOODS) Patient taking differently: 1037mby mouth once daily 08/02/17  Yes McVey, EliGelene MinkA-C  triamcinolone (KENALOG) 0.025 % ointment Apply 1 application topically 2 (two) times daily. Patient taking differently: Apply 1 application topically daily as needed (itching).  12/02/17  Yes McVey, EliGelene MinkPA-C  venlafaxine (EFFEXOR) 75 MG tablet Take 1 tablet (75 mg total) by mouth daily. 12/12/17  Yes McVey, EliGelene MinkA-C  diclofenac sodium (VOLTAREN) 1 % GEL Apply 4 g topically 4 (four) times daily. 01/09/18   IsaDuffy BruceD  FLUoxetine (PROZAC) 20 MG capsule TAKE 1 CAPSULE BY MOUTH TWICE DAILY Patient not taking: Reported on 01/09/2018 10/07/17   McVey, EliGelene MinkA-C  lidocaine (LIDODERM) 5 % Place 1 patch onto the skin daily. Remove & Discard patch within 12 hours or as directed by MD 01/09/18   IsaDuffy BruceD  lisdexamfetamine (VYVANSE) 30 MG capsule Take 1 capsule (30 mg total) by mouth daily. Patient not taking: Reported on 12/12/2017 10/28/17   McVey, EliGelene MinkA-C  methocarbamol (ROBAXIN) 500 MG tablet Take 1 tablet (500 mg total) by mouth 2 (two) times daily as needed for muscle spasms. 01/09/18   IsaDuffy BruceD    Family History Family History  Problem Relation Age of Onset  . Hypertension Mother   . Diabetes Father   . Heart attack Father   . Heart attack Brother     Social History Social History   Tobacco Use  . Smoking status: Never Smoker  . Smokeless tobacco: Never Used  Substance Use Topics  . Alcohol use: No  . Drug use: No     Allergies   Azithromycin; Cardizem [diltiazem hcl]; Codeine; Coreg [carvedilol]; Demerol [meperidine]; Erythromycin; Inderal [propranolol]; Procardia [nifedipine]; Wellbutrin [bupropion]; and Zoloft [sertraline hcl]   Review of Systems Review of Systems  Constitutional: Negative for chills, fatigue and fever.  HENT: Negative for congestion and rhinorrhea.   Eyes: Negative for visual disturbance.  Respiratory: Negative for cough, shortness of breath and wheezing.   Cardiovascular: Negative for chest pain and leg swelling.  Gastrointestinal: Negative for abdominal pain, diarrhea, nausea and vomiting.  Genitourinary: Negative for dysuria and flank pain.  Musculoskeletal: Positive for arthralgias  and back pain. Negative for neck pain and neck stiffness.  Skin: Negative for rash and wound.  Allergic/Immunologic: Negative for immunocompromised state.  Neurological: Negative for syncope, weakness and headaches.  All other systems reviewed and are negative.    Physical Exam Updated Vital Signs BP (!) 157/69   Pulse 80   Temp 98.3 F (36.8 C) (Oral)   Resp 15   Ht '5\' 3"'  (1.6 m)   Wt 75.8 kg (167 lb)   SpO2 100%  BMI 29.58 kg/m   Physical Exam  Constitutional: She is oriented to person, place, and time. She appears well-developed and well-nourished. No distress.  HENT:  Head: Normocephalic and atraumatic.  Eyes: Conjunctivae are normal.  Neck: Neck supple.  Cardiovascular: Normal rate, regular rhythm and normal heart sounds.  Pulmonary/Chest: Effort normal. No respiratory distress. She has no wheezes.  Abdominal: She exhibits no distension.  Musculoskeletal: She exhibits no edema.       Back:  Neurological: She is alert and oriented to person, place, and time. She exhibits normal muscle tone.  Skin: Skin is warm. Capillary refill takes less than 2 seconds. No rash noted.  Nursing note and vitals reviewed.    ED Treatments / Results  Labs (all labs ordered are listed, but only abnormal results are displayed) Labs Reviewed  CBC WITH DIFFERENTIAL/PLATELET - Abnormal; Notable for the following components:      Result Value   Hemoglobin 10.9 (*)    HCT 33.9 (*)    All other components within normal limits  COMPREHENSIVE METABOLIC PANEL - Abnormal; Notable for the following components:   CO2 21 (*)    Glucose, Bld 171 (*)    BUN 28 (*)    Total Protein 6.3 (*)    Albumin 3.3 (*)    AST 42 (*)    Total Bilirubin 1.3 (*)    All other components within normal limits  D-DIMER, QUANTITATIVE (NOT AT [facility]) - Abnormal; Notable for the following components:   D-Dimer, Quant 1.36 (*)    All other components within normal limits  LIPASE, BLOOD  I-STAT TROPONIN, ED    I-STAT TROPONIN, ED    EKG  EKG Interpretation  Date/Time:  Friday January 09 2018 09:06:30 EST Ventricular Rate:  76 PR Interval:    QRS Duration: 106 QT Interval:  448 QTC Calculation: 504 R Axis:   72 Text Interpretation:  Sinus rhythm Atrial premature complex Low voltage, precordial leads Borderline T abnormalities, lateral leads Prolonged QT interval No significant change since last tracing Confirmed by Duffy Bruce [phone]) on 01/09/2018 9:12:56 AM       Radiology Dg Chest 2 View  Result Date: 01/09/2018 CLINICAL DATA:  Multiple falls.  Chest pain. EXAM: CHEST  2 VIEW COMPARISON:  03/02/2016 FINDINGS: Normal heart size and mediastinal contours. Aortic calcification. There is artifact from EKG leads which likely accounts for the asymmetric suprahilar density on the right. Borderline interstitial coarsening without Kerley lines or effusion. No air bronchogram. Prominent glenohumeral degenerative change. No acute osseous finding. IMPRESSION: No acute finding or change from prior. Electronically Signed   By: Monte Fantasia M.D.   On: 01/09/2018 10:32   Dg Thoracic Spine 2 View  Result Date: 01/09/2018 CLINICAL DATA:  Back pain EXAM: THORACIC SPINE 2 VIEWS COMPARISON:  Chest two-view 03/12/2016 FINDINGS: Thoracic disc degeneration and spurring most prominent T8-9 and T9-10. Negative for fracture. No bone lesion. Atherosclerotic calcification aortic arch. Lumbar disc degeneration T12-L1 and L2-3. IMPRESSION: Thoracic degenerative changes.  No acute skeletal abnormality. Electronically Signed   By: Franchot Gallo M.D.   On: 01/09/2018 10:29   Ct Angio Chest/abd/pel For Dissection W And/or Wo Contrast  Result Date: 01/09/2018 CLINICAL DATA:  Pain between the shoulder blades radiating into the left flank. Clinical concern for aortic dissection. EXAM: CT ANGIOGRAPHY CHEST, ABDOMEN AND PELVIS TECHNIQUE: Multidetector CT imaging through the chest, abdomen and pelvis was performed using  the standard protocol during bolus administration of intravenous contrast. Multiplanar reconstructed images and MIPs  were obtained and reviewed to evaluate the vascular anatomy. CONTRAST:  173m ISOVUE-370 IOPAMIDOL (ISOVUE-370) INJECTION 76% COMPARISON:  None. FINDINGS: CTA CHEST FINDINGS Cardiovascular: Precontrast imaging shows no hyperattenuating crescent in the wall of the thoracic aorta to suggest acute intramural hematoma. No thoracic aortic aneurysm. No dissection of the thoracic aorta. Atherosclerotic calcification is noted in the wall of the thoracic aorta. No large central pulmonary embolus and no pulmonary embolic disease is identified in lobar pulmonary arteries to either lung. Heart size within normal limits. Coronary artery calcification is evident. Mediastinum/Nodes: 7 mm short axis precarinal lymph node evident. 11 mm short axis right hilar lymph node evident. 12 mm short axis inferior right hilar lymph node evident. Other scattered upper normal sized mediastinal and hilar lymph nodes evident. There is no axillary lymphadenopathy. Lungs/Pleura: Mosaic attenuation noted in the lungs bilaterally on postcontrast imaging suggesting air trapping, likely related to small airway disease. Tiny nodules posterior right lung in the lower lobe measure up to 3 mm (see image 39 series 6). Some of these nodules are densely calcified compatible with granulomata. 3 mm noncalcified left upper lobe nodule seen on image 29. No focal airspace consolidation. Tiny left pleural effusion evident Musculoskeletal: Bone windows reveal no worrisome lytic or sclerotic osseous lesions. Chronic posttraumatic/degenerative changes are noted in the right shoulder with osteoarthritis noted in the left shoulder. Review of the MIP images confirms the above findings. CTA ABDOMEN AND PELVIS FINDINGS VASCULAR Aorta: Atherosclerosis without aneurysm or dissection. Abdominal aorta measures up to approximately 1.7 cm maximum diameter. Celiac:  Probable flow limiting stenosis at the origin although no poststenotic dilatation is evident. SMA: Dense calcific plaque at the ostium, but vessel remains widely patent. Renals: Unilateral left kidney. Plaque at the origin of the main renal artery without evidence for flow-limiting stenosis. Accessory left renal artery to the lower pole arises about 2 cm proximal to the aortic bifurcation. IMA: Widely patent. Inflow: Both internal iliac arteries widely patent. Veins: Not well evaluated due to bolus timing. Review of the MIP images confirms the above findings. NON-VASCULAR Hepatobiliary: No focal abnormality within the liver parenchyma. There is no evidence for gallstones, gallbladder wall thickening, or pericholecystic fluid. No intrahepatic or extrahepatic biliary dilation. Pancreas: No focal mass lesion. No dilatation of the main duct. No intraparenchymal cyst. No peripancreatic edema. Spleen: No splenomegaly. No focal mass lesion. Adrenals/Urinary Tract: No adrenal nodule or mass. Unilateral left kidney. The urinary bladder appears normal for the degree of distention. Stomach/Bowel: Tiny hiatal hernia. Stomach otherwise unremarkable. Duodenum is normally positioned as is the ligament of Treitz. No small bowel wall thickening. No small bowel dilatation. The terminal ileum is normal. The appendix is not visualized, but there is no edema or inflammation in the region of the cecum. Diverticular changes are noted in the left colon without evidence of diverticulitis. Lymphatic: There is no gastrohepatic or hepatoduodenal ligament lymphadenopathy. No intraperitoneal or retroperitoneal lymphadenopathy. No pelvic sidewall lymphadenopathy. Reproductive: Uterus surgically absent. 1.6 x 2.2 cm cystic focus in the right adnexal space is probably ovarian. This has benign appearance. Left ovary unremarkable. Other: No intraperitoneal free fluid. Musculoskeletal: Bone windows reveal no worrisome lytic or sclerotic osseous  lesions. Review of the MIP images confirms the above findings. IMPRESSION: 1. No evidence for thoracoabdominal aortic aneurysm. No dissection of the thoracoabdominal aorta. 2. Borderline to mild lymphadenopathy in the right hilum. Follow-up CT chest with contrast recommended in 3 months to ensure stability. 3. Tiny left pleural effusion. 4.  Aortic Atherosclerois (ICD10-170.0) 5.  2.2 cm benign appearing cystic lesion in the right ovary. Given lesion size and patient age, follow-up pelvic ultrasound warranted. This recommendation follows ACR consensus guidelines: White Paper of the ACR Incidental Findings Committee II on Adnexal Findings. J Am Coll Radiol [phone]. Electronically Signed   By: Misty Stanley M.D.   On: 01/09/2018 14:09    Procedures Procedures (including critical care time)  Medications Ordered in ED Medications  lidocaine (LIDODERM) 5 % 1 patch (1 patch Transdermal Patch Applied 01/09/18 1351)  morphine 4 MG/ML injection 4 mg (4 mg Intravenous Given 01/09/18 0952)  ondansetron (ZOFRAN) injection 4 mg (4 mg Intravenous Given 01/09/18 0952)  methocarbamol (ROBAXIN) 500 mg in dextrose 5 % 50 mL IVPB (0 mg Intravenous Stopped 01/09/18 1355)  iopamidol (ISOVUE-370) 76 % injection (100 mLs  Contrast Given 01/09/18 1315)  morphine 4 MG/ML injection 4 mg (4 mg Intravenous Given 01/09/18 1352)     Initial Impression / Assessment and Plan / ED Course  I have reviewed the triage vital signs and the nursing notes.  Pertinent labs & imaging results that were available during my care of the patient were reviewed by me and considered in my medical decision making (see chart for details).     77 year old female here with severe back pain.  History is somewhat limited as patient is somewhat difficult historian, but sounds like she awoke with what is likely musculoskeletal paraspinal spasm and muscular pain.  She has a history of chronic muscular pain and intermittent spasms.  However, due to  undifferentiated nature of symptoms, must consider alternative etiologies.  CT dissection study obtained due to positive d-dimer and tearing pain to the back.  Fortunately, there is no evidence of dissection as well as no evidence of PE.  No other significant abnormalities.  Otherwise, her pain is significantly resolved with lidocaine patch and analgesia here.  Her EKG is nonischemic and troponins are negative x2 despite constant symptoms greater than 12 hours.  I have a very low suspicion for ACS at this time.  She is otherwise well-appearing and ambulatory at her baseline.  Given her well appearance, history and exam highly consistent with musculoskeletal etiology, I discussed my likely diagnosis as well as alternative etiologies with the patient and family in detail.  She feels that this is very much musculoskeletal pain and is comfortable with treating supportively for this with return and referral if symptoms do not improve.  This note was prepared with assistance of Systems analyst. Occasional wrong-word or sound-a-like substitutions may have occurred due to the inherent limitations of voice recognition software.   Final Clinical Impressions(s) / ED Diagnoses   Final diagnoses:  Back spasm  Acute right-sided thoracic back pain    ED Discharge Orders        Ordered    diclofenac sodium (VOLTAREN) 1 % GEL  4 times daily     01/09/18 1425    methocarbamol (ROBAXIN) 500 MG tablet  2 times daily PRN     01/09/18 1425    lidocaine (LIDODERM) 5 %  Every 24 hours     01/09/18 1425       Duffy Bruce, MD 01/09/18 1433

## 2018-01-10 ENCOUNTER — Telehealth: Payer: Self-pay | Admitting: *Deleted

## 2018-01-10 ENCOUNTER — Telehealth: Payer: Self-pay

## 2018-01-10 NOTE — Telephone Encounter (Signed)
Fax req for med change; pt does not like Triamcinolone ointment, wants hydrocortisone 2%

## 2018-01-10 NOTE — Telephone Encounter (Signed)
Per pharmacy request pt doesn't like triamcinolone ointment, and would like a different med. I spoke with the patient and she stated that" the ointment is too greasy". Hydrocortisone cream 2% helps. She use it around her nose for breakout, scaly and itching, "it takes care of it right away."

## 2018-01-19 ENCOUNTER — Other Ambulatory Visit: Payer: Self-pay | Admitting: Physician Assistant

## 2018-01-19 DIAGNOSIS — R21 Rash and other nonspecific skin eruption: Secondary | ICD-10-CM

## 2018-01-19 MED ORDER — HYDROCORTISONE 2 % EX LOTN: 1.0000 "application " | TOPICAL_LOTION | Freq: Two times a day (BID) | CUTANEOUS | 0 refills | Status: DC

## 2018-01-20 ENCOUNTER — Other Ambulatory Visit: Payer: Self-pay

## 2018-01-20 ENCOUNTER — Ambulatory Visit (INDEPENDENT_AMBULATORY_CARE_PROVIDER_SITE_OTHER): Payer: Medicare Other | Admitting: Physician Assistant

## 2018-01-20 ENCOUNTER — Encounter: Payer: Self-pay | Admitting: Physician Assistant

## 2018-01-20 VITALS — BP 122/82 | HR 98 | Temp 98.0°F | Resp 16 | Ht 62.0 in | Wt 177.0 lb

## 2018-01-20 DIAGNOSIS — F0630 Mood disorder due to known physiological condition, unspecified: Secondary | ICD-10-CM | POA: Diagnosis not present

## 2018-01-20 DIAGNOSIS — Z09 Encounter for follow-up examination after completed treatment for conditions other than malignant neoplasm: Secondary | ICD-10-CM

## 2018-01-20 DIAGNOSIS — F419 Anxiety disorder, unspecified: Secondary | ICD-10-CM | POA: Diagnosis not present

## 2018-01-20 MED ORDER — VENLAFAXINE HCL 100 MG PO TABS: 100.0000 mg | ORAL_TABLET | Freq: Every day | ORAL | 2 refills | Status: DC

## 2018-01-20 NOTE — Progress Notes (Signed)
[name]  MRN: [id number] DOB: 01-26-1941  PCP: Dorise Hiss, PA-C  Subjective:  Pt is a 77 year old female who presents to clinic for hospital f/u. She was seen in the ED on 2/15 for back spasm. Negative CT dissection study. EKG is nonischemic and troponins are negative x2.  She was treated with lidocaine patch and analgesia.  She would like to make changes to her psych medications.   Review of Systems  Constitutional: Negative for chills, diaphoresis, fatigue and fever.  Respiratory: Negative for cough, shortness of breath and wheezing.   Cardiovascular: Negative for chest pain and palpitations.  Gastrointestinal: Negative for abdominal pain, diarrhea, nausea and vomiting.  Genitourinary: Negative for decreased urine volume, difficulty urinating, dysuria, enuresis, flank pain, frequency, hematuria and urgency.  Musculoskeletal: Positive for back pain. Negative for arthralgias and gait problem.  Neurological: Negative for dizziness, weakness, light-headedness and headaches.    Patient Active Problem List   Diagnosis Date Noted  . Hyperglycemia 09/25/2017  . Type 2 diabetes mellitus with complication, without long-term current use of insulin ([place]) 04/24/2017  . ADD (attention deficit disorder) 12/30/2016  . Narcotic addiction ([place]) 12/05/2014  . Excessive daytime sleepiness 12/05/2014  . Alteration in psychomotor activity 12/05/2014  . Depression with somatization 12/05/2014  . Hypersomnia, persistent 09/10/2013  . Obesity, unspecified 09/10/2013  . Chronic pain syndrome 09/10/2013  . Depression 07/31/2013  . Medication intolerance 07/19/2013  . Anxiety 07/19/2013  . CAD (coronary artery disease) status post stent to distal right coronary artery 2006 06/20/2013  . Neurocardiogenic syncope 06/20/2013  . Hyperlipidemia 06/20/2013    Current Outpatient Medications on File Prior to Visit  Medication Sig Dispense Refill  . ACCU-CHEK SOFTCLIX LANCETS lancets  USE TO TEST FOUR TIMES DAILY 100 each 0  . Alfalfa 250 MG TABS Take 1 tablet by mouth every morning.    Marland Kitchen aspirin 81 MG tablet Take 81 mg by mouth daily.    Marland Kitchen atenolol (TENORMIN) 25 MG tablet TAKE 1 TABLET BY MOUTH IN THE MORNING AND 2 IN THE EVENING (Patient taking differently: 21m by mouth daily at 12noon, and 55mby mouth in evening) 270 tablet 0  . blood glucose meter kit and supplies Dispense based on patient and insurance preference. Use up to four times daily as directed. (FOR ICD-9 250.00, 250.01). 1 each 0  . Cholecalciferol (VITAMIN D3) 5000 units TBDP Take 1 capsule by mouth daily.    . CHROMIUM ASPARTATE PO Take 1 tablet by mouth every morning.    . Verneita Griffesark POWD Take 1 Dose by mouth daily.    . Marland Kitchenextromethorphan (DELSYM) 30 MG/5ML liquid Take 30 mg by mouth daily as needed for cough.    . DHA-EPA-Coenzyme Q10-Vitamin E (GNP COQ-10 & FISH OIL) 120-180-50-30 CAPS Take 3 tablets by mouth daily.     . diazepam (VALIUM) 5 MG tablet diazepam 5 mg tablet    . diclofenac sodium (VOLTAREN) 1 % GEL Apply 4 g topically 4 (four) times daily. 100 g 0  . diphenhydrAMINE (BENADRYL) 25 MG tablet Take 25 mg by mouth every 6 (six) hours as needed for itching.    . fexofenadine (ALLEGRA) 180 MG tablet Take 180 mg by mouth daily as needed for allergies.     . Marland Kitchenlucose 4 GM chewable tablet Chew 1 tablet by mouth as needed for low blood sugar.    . Marland Kitchenlucose blood (ACCU-CHEK AVIVA PLUS) test strip USE TO TEST FOUR TIMES DAILY 100 each 0  .  hydrochlorothiazide (MICROZIDE) 12.5 MG capsule Take 1 capsule (12.5 mg total) by mouth daily. (Patient taking differently: Take 25 mg by mouth daily. ) 30 capsule 2  . HYDROcodone-acetaminophen (NORCO) 10-325 MG per tablet Take 1-2 tablets by mouth every 4 (four) hours as needed for moderate pain.     Marland Kitchen HYDROCORTISONE, TOPICAL, 2 % LOTN Apply 1 application topically 2 (two) times daily. 1 Bottle 0  . ibuprofen (ADVIL,MOTRIN) 200 MG tablet Take 200 mg by mouth every 6  (six) hours as needed for moderate pain.    . Insulin Glargine (LANTUS SOLOSTAR) 100 UNIT/ML Solostar Pen Inject 24 Units into the skin daily at 10 pm. 5 pen PRN  . Insulin Syringe-Needle U-100 (INSULIN SYRINGE .5CC/31GX5/16") 31G X 5/16" 0.5 ML MISC Use as directed 100 each 0  . levothyroxine (SYNTHROID, LEVOTHROID) 50 MCG tablet Take 1 tablet (50 mcg total) by mouth daily before breakfast. 30 tablet 5  . lidocaine (LIDODERM) 5 % Place 1 patch onto the skin daily. Remove & Discard patch within 12 hours or as directed by MD 30 patch 0  . lisdexamfetamine (VYVANSE) 30 MG capsule Take 1 capsule (30 mg total) by mouth daily. 30 capsule 0  . lisinopril (PRINIVIL,ZESTRIL) 20 MG tablet TAKE 1 TABLET BY MOUTH IN THE MORNING AND 1/2 (ONE-HALF) AT BEDTIME (Patient taking differently: 62m by mouth every morning, 160mby mouth every evening) 135 tablet 0  . magnesium oxide (MAG-OX) 400 MG tablet Take 400 mg by mouth 2 (two) times daily.    . metFORMIN (GLUCOPHAGE) 500 MG tablet Take 1 tablet (500 mg total) by mouth 2 (two) times daily with a meal. Wk1: 1/2 tab 2x/day; Wk2: 1 tab am, 1/2 tab pm; Wk3: 2 tabs 2x/day. 180 tablet 3  . methocarbamol (ROBAXIN) 500 MG tablet Take 1 tablet (500 mg total) by mouth 2 (two) times daily as needed for muscle spasms. 20 tablet 0  . Misc. Devices (HUGO ROLLING WALKER ELITE) MISC 1 Units by Does not apply route daily as needed. 1 each 0  . potassium chloride (K-DUR,KLOR-CON) 10 MEQ tablet TAKE 1 TAB (10 MEQ TOTAL) BY MOUTH AS NEEDED (ONLY TAKES IF SHE DOES NOT EAT POTASSIUM RICH FOODS) (Patient taking differently: 1028mby mouth once daily) 30 tablet 0  . triamcinolone (KENALOG) 0.025 % ointment Apply 1 application topically 2 (two) times daily. (Patient taking differently: Apply 1 application topically daily as needed (itching). ) 30 g 0  . venlafaxine (EFFEXOR) 75 MG tablet venlafaxine 75 mg tablet    . amphetamine-dextroamphetamine (ADDERALL) 20 MG tablet Take 1 tablet (20 mg  total) by mouth 2 (two) times daily as needed. 60 tablet 0   No current facility-administered medications on file prior to visit.     Allergies  Allergen Reactions  . Azithromycin Other (See Comments)    unspecified  . Cardizem [Diltiazem Hcl] Nausea And Vomiting  . Codeine Other (See Comments)    Feeling weird   . Coreg [Carvedilol]   . Demerol [Meperidine] Other (See Comments)    Per pt disoriented  . Erythromycin Other (See Comments)    Very sick  . Inderal [Propranolol] Other (See Comments)    Made me cry  . Procardia [Nifedipine] Other (See Comments)    Disoriented, sick  . Wellbutrin [Bupropion] Other (See Comments)    unspecified  . Zoloft [Sertraline Hcl] Other (See Comments)    Could not talk     Objective:  BP 122/82   Pulse 98   Temp  98 F (36.7 C) (Oral)   Resp 16   Ht 5' 2" (1.575 m)   Wt 177 lb (80.3 kg)   SpO2 94%   BMI 32.37 kg/m   Physical Exam  Constitutional: She is oriented to person, place, and time and well-developed, well-nourished, and in no distress. No distress.  Cardiovascular: Normal rate, regular rhythm and normal heart sounds.  Neurological: She is alert and oriented to person, place, and time. GCS score is 15.  Skin: Skin is warm and dry.  Psychiatric: Mood, memory, affect and judgment normal.  Vitals reviewed.   Assessment and Plan :  1. Anxiety - venlafaxine (EFFEXOR) 100 MG tablet; Take 1 tablet (100 mg total) by mouth daily.  Dispense: 30 tablet; Refill: 2 - Start taking diazepam 3-4 times daily. Increase your Effexor to 184m daily.  Come back and see me in 3 months for diabetes check.  - Pt turned in for disposal: Effexor 75 mg 32 pills Fluoxetine 223m35pills + 60pills Vyvanse 3058m26 cap  2. Mood disorder in conditions classified elsewhere - amphetamine-dextroamphetamine (ADDERALL) 20 MG tablet; Take 1 tablet (20 mg total) by mouth 2 (two) times daily as needed.  Dispense: 60 tablet; Refill: 0  3. Encounter for  examination following treatment at hospital    WhiMercer PodA-C  [facility]/2019 5:06 PM

## 2018-01-20 NOTE — Patient Instructions (Addendum)
Start taking diazepam 3-4 times daily.  Increase your Effexor to 128m daily.  Come back and see me in 3 months for diabetes check.   Thank you for coming in today. I hope you feel we met your needs.  Feel free to call PCP if you have any questions or further requests.  Please consider signing up for MyChart if you do not already have it, as this is a great way to communicate with me.  Best,  Whitney McVey, PA-C   IF you received an x-ray today, you will receive an invoice from [facility]. Please contact [facility] at [phone] questions or concerns regarding your invoice.   IF you received labwork today, you will receive an invoice from [place] Please contact [facility] at [phone] questions or concerns regarding your invoice.   Our billing staff will not be able to assist you with questions regarding bills from these companies.  You will be contacted with the lab results as soon as they are available. The fastest way to get your results is to activate your My Chart account. Instructions are located on the last page of this paperwork. If you have not heard from [facility] the results in 2 weeks, please contact this office.

## 2018-01-21 ENCOUNTER — Telehealth: Payer: Self-pay | Admitting: Physician Assistant

## 2018-01-21 MED ORDER — AMPHETAMINE-DEXTROAMPHETAMINE 20 MG PO TABS: 20.0000 mg | ORAL_TABLET | Freq: Two times a day (BID) | ORAL | 0 refills | Status: DC | PRN

## 2018-01-21 MED ORDER — VENLAFAXINE HCL 100 MG PO TABS: 100.0000 mg | ORAL_TABLET | Freq: Every day | ORAL | 2 refills | Status: DC

## 2018-01-21 NOTE — Telephone Encounter (Signed)
Copied from CRM [phone]. Topic: Quick Communication - See Telephone Encounter >> Jan 21, 2018 10:55 AM Windy KalataMichael, Nazirah Tri L, NT wrote: CRM for notification. See Telephone encounter for:  01/21/18.  Patient states she was seen yesterday 01/20/18. States [facility] was supposed to send in 2 scripts one for adderall and one for effexor. She states [facility] effexor but not the adderall. She would like that called in.   [facility] [address]- [place], [place] - [phone]

## 2018-01-23 ENCOUNTER — Ambulatory Visit: Payer: Medicare Other | Admitting: Physician Assistant

## 2018-01-24 NOTE — Telephone Encounter (Signed)
Spoke with pharmacy.  They have Adderall rx and pt has picked up.

## 2018-01-28 DIAGNOSIS — R269 Unspecified abnormalities of gait and mobility: Secondary | ICD-10-CM | POA: Diagnosis not present

## 2018-01-28 DIAGNOSIS — M25561 Pain in right knee: Secondary | ICD-10-CM | POA: Diagnosis not present

## 2018-01-28 DIAGNOSIS — M21161 Varus deformity, not elsewhere classified, right knee: Secondary | ICD-10-CM | POA: Diagnosis not present

## 2018-01-28 DIAGNOSIS — M170 Bilateral primary osteoarthritis of knee: Secondary | ICD-10-CM | POA: Diagnosis not present

## 2018-01-28 DIAGNOSIS — M25562 Pain in left knee: Secondary | ICD-10-CM | POA: Diagnosis not present

## 2018-01-28 DIAGNOSIS — M21162 Varus deformity, not elsewhere classified, left knee: Secondary | ICD-10-CM | POA: Diagnosis not present

## 2018-01-30 ENCOUNTER — Telehealth: Payer: Self-pay | Admitting: Physician Assistant

## 2018-01-30 NOTE — Telephone Encounter (Signed)
Copied from CRM [phone]. Topic: General - Other >> Jan 30, 2018  9:56 AM Windy KalataMichael, Kyrstan Gotwalt L, NT wrote: Victorino DikeJennifer is calling from [facility] and would like to know if the office received a fax with documentation for home health. She states it needs to be signed and faxed back. [phone]  Cb# [phone]

## 2018-02-04 DIAGNOSIS — M25562 Pain in left knee: Secondary | ICD-10-CM | POA: Diagnosis not present

## 2018-02-04 DIAGNOSIS — M170 Bilateral primary osteoarthritis of knee: Secondary | ICD-10-CM | POA: Diagnosis not present

## 2018-02-04 DIAGNOSIS — M25561 Pain in right knee: Secondary | ICD-10-CM | POA: Diagnosis not present

## 2018-02-06 ENCOUNTER — Other Ambulatory Visit: Payer: Self-pay

## 2018-02-06 ENCOUNTER — Emergency Department (HOSPITAL_COMMUNITY)
Admission: EM | Admit: 2018-02-06 | Discharge: 2018-02-06 | Disposition: A | Discharge status: Home / Self Care | Payer: Medicare Other | Attending: Emergency Medicine | Admitting: Emergency Medicine

## 2018-02-06 ENCOUNTER — Encounter (HOSPITAL_COMMUNITY): Payer: Self-pay

## 2018-02-06 ENCOUNTER — Ambulatory Visit: Payer: Self-pay | Admitting: *Deleted

## 2018-02-06 DIAGNOSIS — I2510 Atherosclerotic heart disease of native coronary artery without angina pectoris: Secondary | ICD-10-CM | POA: Diagnosis not present

## 2018-02-06 DIAGNOSIS — E1165 Type 2 diabetes mellitus with hyperglycemia: Secondary | ICD-10-CM | POA: Insufficient documentation

## 2018-02-06 DIAGNOSIS — I6789 Other cerebrovascular disease: Secondary | ICD-10-CM | POA: Diagnosis not present

## 2018-02-06 DIAGNOSIS — Z79899 Other long term (current) drug therapy: Secondary | ICD-10-CM | POA: Insufficient documentation

## 2018-02-06 DIAGNOSIS — G510 Bell's palsy: Secondary | ICD-10-CM | POA: Diagnosis not present

## 2018-02-06 DIAGNOSIS — Z794 Long term (current) use of insulin: Secondary | ICD-10-CM | POA: Insufficient documentation

## 2018-02-06 DIAGNOSIS — Z7982 Long term (current) use of aspirin: Secondary | ICD-10-CM | POA: Insufficient documentation

## 2018-02-06 DIAGNOSIS — I69992 Facial weakness following unspecified cerebrovascular disease: Secondary | ICD-10-CM | POA: Diagnosis not present

## 2018-02-06 DIAGNOSIS — I10 Essential (primary) hypertension: Secondary | ICD-10-CM | POA: Insufficient documentation

## 2018-02-06 DIAGNOSIS — R29810 Facial weakness: Secondary | ICD-10-CM | POA: Diagnosis present

## 2018-02-06 HISTORY — DX: Infectious mononucleosis, unspecified without complication: B27.90

## 2018-02-06 LAB — I-STAT CHEM 8, ED
BUN: 19 mg/dL (ref 6–20)
CHLORIDE: 101 mmol/L (ref 101–111)
CREATININE: 0.6 mg/dL (ref 0.44–1.00)
Calcium, Ion: 1.26 mmol/L (ref 1.15–1.40)
GLUCOSE: 197 mg/dL — AB (ref 65–99)
HCT: 35 % — ABNORMAL LOW (ref 36.0–46.0)
Hemoglobin: 11.9 g/dL — ABNORMAL LOW (ref 12.0–15.0)
POTASSIUM: 4.1 mmol/L (ref 3.5–5.1)
Sodium: 140 mmol/L (ref 135–145)
TCO2: 29 mmol/L (ref 22–32)

## 2018-02-06 NOTE — ED Triage Notes (Signed)
[facility] right sided facial droop noticed Wednesday 3/13, LNW Tuesday night before bed 3/12. Pt endorses pressure behind eyes x1 month. Pt has hx of epstein barr virus and chronic fatigue syndrome. Pt denies falling, LOC, CP, SOB. Pt A+Ox4 and in no apparent distress at time of triage.

## 2018-02-06 NOTE — ED Provider Notes (Signed)
[facility] EMERGENCY DEPARTMENT Provider Note   CSN: [id number] Arrival date & time: 02/06/18  1548     History   Chief Complaint Chief Complaint  Patient presents with  . Facial Droop    HPI [name] is a 77 y.o. female.  Complains of right-sided facial droop onset 2 days ago.  She states that food stripping of her mouth.  She denies difficulty speaking denies change in taste denies visual changes.  She has been having intermittent pressure behind both eyesfor several months none today.  She has not had any pressure behind her eyes for the past 3 days., exacerbated by reading.  No treatment prior to coming here nothing makes symptoms better or worse.  No difficulty with gait.  No fever.  No recent  viral illness.Marland Kitchen  HPI  Past Medical History:  Diagnosis Date  . Abdominal discomfort    "due to medication intolerance"  . CAD (coronary artery disease)    previous stent  . Chronic pain syndrome 09/10/2013   Dr Nelva Bush,   . [facility] Patient virus infection 1988  . Excessive daytime sleepiness 12/05/2014   Patient has not been seen for a sleep study as ordered, and used adderall to keep alert in daytime.  She agreed  In a contract not to have any scheduled medication for pain treatment from [facility] and will not receive refills for Adderall, which was initiated by dr Orland Penman, PCP>   . Fall   . Hyperlipemia   . Hypersomnia, persistent 09/10/2013   Patient on  Stimulants .  Marland Kitchen Hypertension   . Narcotic addiction ([place]) 12/05/2014  . Obesity, unspecified 09/10/2013  . Shoulder joint pain    both shoulders    Patient Active Problem List   Diagnosis Date Noted  . Hyperglycemia 09/25/2017  . Type 2 diabetes mellitus with complication, without long-term current use of insulin ([place]) 04/24/2017  . ADD (attention deficit disorder) 12/30/2016  . Narcotic addiction ([place]) 12/05/2014  . Excessive daytime sleepiness 12/05/2014  . Alteration in psychomotor activity 12/05/2014  .  Depression with somatization 12/05/2014  . Hypersomnia, persistent 09/10/2013  . Obesity, unspecified 09/10/2013  . Chronic pain syndrome 09/10/2013  . Depression 07/31/2013  . Medication intolerance 07/19/2013  . Anxiety 07/19/2013  . CAD (coronary artery disease) status post stent to distal right coronary artery 2006 06/20/2013  . Neurocardiogenic syncope 06/20/2013  . Hyperlipidemia 06/20/2013    Past Surgical History:  Procedure Laterality Date  . ANGIOPLASTY  03/01/2002   cutting balloon mid RCA  . BACK SURGERY    . CARDIAC CATHETERIZATION  01/26/2007   mild diffuse CAD  . CARDIAC CATHETERIZATION  10/23/2009   nonobstructive CAD,60% prox RCA,50% prox LAD, 50% mid ramus  . CORONARY STENT PLACEMENT  03/15/2005   distal RCA  . NEPHRECTOMY      OB History    Gravida Para Term Preterm AB Living   '2 2 2     2   ' SAB TAB Ectopic Multiple Live Births                   Home Medications    Prior to Admission medications   Medication Sig Start Date End Date Taking? Authorizing Provider  ACCU-CHEK SOFTCLIX LANCETS lancets USE TO TEST FOUR TIMES DAILY 12/02/17   McVey, Gelene Mink, PA-C  Alfalfa 250 MG TABS Take 1 tablet by mouth every morning.    [provider]  amphetamine-dextroamphetamine (ADDERALL) 20 MG tablet Take 1 tablet (20  mg total) by mouth 2 (two) times daily as needed. 01/21/18 02/20/18  McVey, Gelene Mink, PA-C  aspirin 81 MG tablet Take 81 mg by mouth daily.    [provider]  atenolol (TENORMIN) 25 MG tablet TAKE 1 TABLET BY MOUTH IN THE MORNING AND 2 IN THE EVENING Patient taking differently: 17m by mouth daily at 12noon, and 588mby mouth in evening 12/10/17   McVey, ElGelene MinkPA-C  blood glucose meter kit and supplies Dispense based on patient and insurance preference. Use up to four times daily as directed. (FOR ICD-9 250.00, 250.01). 09/27/17   LaOswald HillockMD  Cholecalciferol (VITAMIN D3) 5000 units TBDP Take 1 capsule by  mouth daily.    [provider]  CHROMIUM ASPARTATE PO Take 1 tablet by mouth every morning.    [provider]  Cinnamon Bark POWD Take 1 Dose by mouth daily.    [provider]  dextromethorphan (DELSYM) 30 MG/5ML liquid Take 30 mg by mouth daily as needed for cough.    [provider]  DHA-EPA-Coenzyme Q10-Vitamin E (GNP COQ-10 & FISH OIL) 120-180-50-30 CAPS Take 3 tablets by mouth daily.     [provider]  diazepam (VALIUM) 5 MG tablet diazepam 5 mg tablet    [provider]  diclofenac sodium (VOLTAREN) 1 % GEL Apply 4 g topically 4 (four) times daily. 01/09/18   IsDuffy BruceMD  diphenhydrAMINE (BENADRYL) 25 MG tablet Take 25 mg by mouth every 6 (six) hours as needed for itching.    [provider]  fexofenadine (ALLEGRA) 180 MG tablet Take 180 mg by mouth daily as needed for allergies.     [provider]  glucose 4 GM chewable tablet Chew 1 tablet by mouth as needed for low blood sugar.    [provider]  glucose blood (ACCU-CHEK AVIVA PLUS) test strip USE TO TEST FOUR TIMES DAILY 12/02/17   McVey, ElGelene MinkPA-C  hydrochlorothiazide (MICROZIDE) 12.5 MG capsule Take 1 capsule (12.5 mg total) by mouth daily. Patient taking differently: Take 25 mg by mouth daily.  09/28/17   LaOswald HillockMD  HYDROcodone-acetaminophen (NORCO) 10-325 MG per tablet Take 1-2 tablets by mouth every 4 (four) hours as needed for moderate pain.  09/28/14   [provider]  HYDROCORTISONE, TOPICAL, 2 % LOTN Apply 1 application topically 2 (two) times daily. 01/19/18   McVey, ElGelene MinkPA-C  ibuprofen (ADVIL,MOTRIN) 200 MG tablet Take 200 mg by mouth every 6 (six) hours as needed for moderate pain.    [provider]  Insulin Glargine (LANTUS SOLOSTAR) 100 UNIT/ML Solostar Pen Inject 24 Units into the skin daily at 10 pm. 10/24/17   McVey, ElGelene MinkPA-C  Insulin Syringe-Needle U-100 (INSULIN  SYRINGE .5CC/31GX5/16") 31G X 5/16" 0.5 ML MISC Use as directed 09/27/17   LaOswald HillockMD  levothyroxine (SYNTHROID, LEVOTHROID) 50 MCG tablet Take 1 tablet (50 mcg total) by mouth daily before breakfast. 03/07/17   McVey, ElGelene MinkPA-C  lidocaine (LIDODERM) 5 % Place 1 patch onto the skin daily. Remove & Discard patch within 12 hours or as directed by MD 01/09/18   IsDuffy BruceMD  lisdexamfetamine (VYVANSE) 30 MG capsule Take 1 capsule (30 mg total) by mouth daily. 10/28/17   McVey, ElGelene MinkPA-C  lisinopril (PRINIVIL,ZESTRIL) 20 MG tablet TAKE 1 TABLET BY MOUTH IN THE MORNING AND 1/2 (ONE-HALF) AT BEDTIME Patient taking differently: 2039my mouth every morning, 90m2m mouth  every evening 08/02/17   McVey, Gelene Mink, PA-C  magnesium oxide (MAG-OX) 400 MG tablet Take 400 mg by mouth 2 (two) times daily.    [provider]  metFORMIN (GLUCOPHAGE) 500 MG tablet Take 1 tablet (500 mg total) by mouth 2 (two) times daily with a meal. Wk1: 1/2 tab 2x/day; Wk2: 1 tab am, 1/2 tab pm; Wk3: 2 tabs 2x/day. 09/27/17   Oswald Hillock, MD  methocarbamol (ROBAXIN) 500 MG tablet Take 1 tablet (500 mg total) by mouth 2 (two) times daily as needed for muscle spasms. 01/09/18   Duffy Bruce, MD  Misc. Devices (HUGO ROLLING WALKER ELITE) MISC 1 Units by Does not apply route daily as needed. 11/29/16   McVey, Gelene Mink, PA-C  potassium chloride (K-DUR,KLOR-CON) 10 MEQ tablet TAKE 1 TAB (10 MEQ TOTAL) BY MOUTH AS NEEDED (ONLY TAKES IF SHE DOES NOT EAT POTASSIUM RICH FOODS) Patient taking differently: 71mq by mouth once daily 08/02/17   McVey, EGelene Mink PA-C  triamcinolone (KENALOG) 0.025 % ointment Apply 1 application topically 2 (two) times daily. Patient taking differently: Apply 1 application topically daily as needed (itching).  12/02/17   McVey, EGelene Mink PA-C  venlafaxine (EFFEXOR) 100 MG tablet Take 1 tablet (100 mg total) by mouth daily. 01/21/18   McVey,  EGelene Mink PA-C    Family History Family History  Problem Relation Age of Onset  . Hypertension Mother   . Diabetes Father   . Heart attack Father   . Heart attack Brother     Social History Social History   Tobacco Use  . Smoking status: Never Smoker  . Smokeless tobacco: Never Used  Substance Use Topics  . Alcohol use: No  . Drug use: No     Allergies   Azithromycin; Cardizem [diltiazem hcl]; Codeine; Coreg [carvedilol]; Demerol [meperidine]; Erythromycin; Inderal [propranolol]; Procardia [nifedipine]; Wellbutrin [bupropion]; and Zoloft [sertraline hcl]   Review of Systems Review of Systems  Musculoskeletal: Positive for arthralgias and gait problem.       Walks sometimes with cane and walker.  Can also walk unassisted.  Knee pain  Allergic/Immunologic: Positive for immunocompromised state.       Diabetic  Neurological: Positive for facial asymmetry.  All other systems reviewed and are negative.    Physical Exam Updated Vital Signs BP (!) 189/68   Pulse 76   Temp 98.4 F (36.9 C) (Oral)   Resp 16   Ht '5\' 3"'  (1.6 m)   Wt 80.3 kg (177 lb)   SpO2 100%   BMI 31.35 kg/m   Physical Exam  Constitutional: She is oriented to person, place, and time. She appears well-developed and well-nourished.  HENT:  Head: Normocephalic and atraumatic.  Right Ear: External ear normal.  Left Ear: External ear normal.  Mouth/Throat: Oropharynx is clear and moist.  Right-sided facial droop to include entire right face.  Eyes: Conjunctivae and EOM are normal. Pupils are equal, round, and reactive to light.  Upon blinking does not close right eye entirely..  No conjunctival erythema.  No pain on extraocular movement  Neck: Neck supple. No tracheal deviation present. No thyromegaly present.  Cardiovascular: Normal rate and regular rhythm.  No murmur heard. Pulmonary/Chest: Effort normal and breath sounds normal.  Abdominal: Soft. Bowel sounds are normal. She exhibits no  distension. There is no tenderness.  Musculoskeletal: Normal range of motion. She exhibits no edema or tenderness.  Neurological: She is alert and oriented to person, place, and time. Coordination normal.  Right-sided peripheral  cranial nerve VII deficit other cranial nerves II through XII grossly intact.  Gait normal.  Walks without assistance Romberg normal pronator drift normal finger to nose normal.  Skin: Skin is warm and dry. Capillary refill takes less than 2 seconds. No rash noted.  Psychiatric: She has a normal mood and affect.  Nursing note and vitals reviewed.    ED Treatments / Results  Labs (all labs ordered are listed, but only abnormal results are displayed) Labs Reviewed  I-STAT CHEM 8, ED    EKG  EKG Interpretation None      Results for orders placed or performed during the hospital encounter of 02/06/18  I-stat chem 8, ed  Result Value Ref Range   Sodium 140 135 - 145 mmol/L   Potassium 4.1 3.5 - 5.1 mmol/L   Chloride 101 101 - 111 mmol/L   BUN 19 6 - 20 mg/dL   Creatinine, Ser 0.60 0.44 - 1.00 mg/dL   Glucose, Bld 197 (H) 65 - 99 mg/dL   Calcium, Ion 1.26 1.15 - 1.40 mmol/L   TCO2 29 22 - 32 mmol/L   Hemoglobin 11.9 (L) 12.0 - 15.0 g/dL   HCT 35.0 (L) 36.0 - 46.0 %   Dg Chest 2 View  Result Date: 01/09/2018 CLINICAL DATA:  Multiple falls.  Chest pain. EXAM: CHEST  2 VIEW COMPARISON:  03/02/2016 FINDINGS: Normal heart size and mediastinal contours. Aortic calcification. There is artifact from EKG leads which likely accounts for the asymmetric suprahilar density on the right. Borderline interstitial coarsening without Kerley lines or effusion. No air bronchogram. Prominent glenohumeral degenerative change. No acute osseous finding. IMPRESSION: No acute finding or change from prior. Electronically Signed   By: Monte Fantasia M.D.   On: 01/09/2018 10:32   Dg Thoracic Spine 2 View  Result Date: 01/09/2018 CLINICAL DATA:  Back pain EXAM: THORACIC SPINE 2 VIEWS  COMPARISON:  Chest two-view 03/12/2016 FINDINGS: Thoracic disc degeneration and spurring most prominent T8-9 and T9-10. Negative for fracture. No bone lesion. Atherosclerotic calcification aortic arch. Lumbar disc degeneration T12-L1 and L2-3. IMPRESSION: Thoracic degenerative changes.  No acute skeletal abnormality. Electronically Signed   By: Franchot Gallo M.D.   On: 01/09/2018 10:29   Ct Angio Chest/abd/pel For Dissection W And/or Wo Contrast  Result Date: 01/09/2018 CLINICAL DATA:  Pain between the shoulder blades radiating into the left flank. Clinical concern for aortic dissection. EXAM: CT ANGIOGRAPHY CHEST, ABDOMEN AND PELVIS TECHNIQUE: Multidetector CT imaging through the chest, abdomen and pelvis was performed using the standard protocol during bolus administration of intravenous contrast. Multiplanar reconstructed images and MIPs were obtained and reviewed to evaluate the vascular anatomy. CONTRAST:  178m ISOVUE-370 IOPAMIDOL (ISOVUE-370) INJECTION 76% COMPARISON:  None. FINDINGS: CTA CHEST FINDINGS Cardiovascular: Precontrast imaging shows no hyperattenuating crescent in the wall of the thoracic aorta to suggest acute intramural hematoma. No thoracic aortic aneurysm. No dissection of the thoracic aorta. Atherosclerotic calcification is noted in the wall of the thoracic aorta. No large central pulmonary embolus and no pulmonary embolic disease is identified in lobar pulmonary arteries to either lung. Heart size within normal limits. Coronary artery calcification is evident. Mediastinum/Nodes: 7 mm short axis precarinal lymph node evident. 11 mm short axis right hilar lymph node evident. 12 mm short axis inferior right hilar lymph node evident. Other scattered upper normal sized mediastinal and hilar lymph nodes evident. There is no axillary lymphadenopathy. Lungs/Pleura: Mosaic attenuation noted in the lungs bilaterally on postcontrast imaging suggesting air trapping, likely related to small  airway  disease. Tiny nodules posterior right lung in the lower lobe measure up to 3 mm (see image 39 series 6). Some of these nodules are densely calcified compatible with granulomata. 3 mm noncalcified left upper lobe nodule seen on image 29. No focal airspace consolidation. Tiny left pleural effusion evident Musculoskeletal: Bone windows reveal no worrisome lytic or sclerotic osseous lesions. Chronic posttraumatic/degenerative changes are noted in the right shoulder with osteoarthritis noted in the left shoulder. Review of the MIP images confirms the above findings. CTA ABDOMEN AND PELVIS FINDINGS VASCULAR Aorta: Atherosclerosis without aneurysm or dissection. Abdominal aorta measures up to approximately 1.7 cm maximum diameter. Celiac: Probable flow limiting stenosis at the origin although no poststenotic dilatation is evident. SMA: Dense calcific plaque at the ostium, but vessel remains widely patent. Renals: Unilateral left kidney. Plaque at the origin of the main renal artery without evidence for flow-limiting stenosis. Accessory left renal artery to the lower pole arises about 2 cm proximal to the aortic bifurcation. IMA: Widely patent. Inflow: Both internal iliac arteries widely patent. Veins: Not well evaluated due to bolus timing. Review of the MIP images confirms the above findings. NON-VASCULAR Hepatobiliary: No focal abnormality within the liver parenchyma. There is no evidence for gallstones, gallbladder wall thickening, or pericholecystic fluid. No intrahepatic or extrahepatic biliary dilation. Pancreas: No focal mass lesion. No dilatation of the main duct. No intraparenchymal cyst. No peripancreatic edema. Spleen: No splenomegaly. No focal mass lesion. Adrenals/Urinary Tract: No adrenal nodule or mass. Unilateral left kidney. The urinary bladder appears normal for the degree of distention. Stomach/Bowel: Tiny hiatal hernia. Stomach otherwise unremarkable. Duodenum is normally positioned as is the ligament  of Treitz. No small bowel wall thickening. No small bowel dilatation. The terminal ileum is normal. The appendix is not visualized, but there is no edema or inflammation in the region of the cecum. Diverticular changes are noted in the left colon without evidence of diverticulitis. Lymphatic: There is no gastrohepatic or hepatoduodenal ligament lymphadenopathy. No intraperitoneal or retroperitoneal lymphadenopathy. No pelvic sidewall lymphadenopathy. Reproductive: Uterus surgically absent. 1.6 x 2.2 cm cystic focus in the right adnexal space is probably ovarian. This has benign appearance. Left ovary unremarkable. Other: No intraperitoneal free fluid. Musculoskeletal: Bone windows reveal no worrisome lytic or sclerotic osseous lesions. Review of the MIP images confirms the above findings. IMPRESSION: 1. No evidence for thoracoabdominal aortic aneurysm. No dissection of the thoracoabdominal aorta. 2. Borderline to mild lymphadenopathy in the right hilum. Follow-up CT chest with contrast recommended in 3 months to ensure stability. 3. Tiny left pleural effusion. 4.  Aortic Atherosclerois (ICD10-170.0) 5. 2.2 cm benign appearing cystic lesion in the right ovary. Given lesion size and patient age, follow-up pelvic ultrasound warranted. This recommendation follows ACR consensus guidelines: White Paper of the ACR Incidental Findings Committee II on Adnexal Findings. J Am Coll Radiol [phone]. Electronically Signed   By: Misty Stanley M.D.   On: 01/09/2018 14:09   Radiology No results found.  Procedures Procedures (including critical care time)  Medications Ordered in ED Medications - No data to display   Initial Impression / Assessment and Plan / ED Course  I have reviewed the triage vital signs and the nursing notes.  Pertinent labs & imaging results that were available during my care of the patient were reviewed by me and considered in my medical decision making (see chart for details).     I  Had a lengthy conversation with patient I do not feel that imaging of her brain  is indicated.  She is in agreement.  No recent viral illness therefore we will not prescribe antivirals.  In light of the fact patient is diabetic we will not prescribe steroids.  Plan follow-up with Dr. Brett Fairy Suggest artificial tears and Lacri-Lube to protect her right eye yolab work consistent with mild hyperglycemia Final Clinical Impressions(s) / ED Diagnoses  Diagnoses #1 right-sided peripheral 7th nerve palsy #2 hyperglycemia #3 elevated blood pressure Final diagnoses:  None    ED Discharge Orders    None       Orlie Dakin, MD 02/06/18 1750

## 2018-02-06 NOTE — Discharge Instructions (Signed)
Artificial tears in place 2 drops into your right eye every 2 hours while awake.  Also get Lacri-Lube gel and place the gel into your right eye before going to sleep to prevent drying of your eye.  Call Dr. Oliva Bustardohmeier's office on Monday, February 09, 2018 to arrange to be seen within the next 1-2 weeks.  Your blood pressure should be rechecked within the next 3 weeks.  Today's is elevated at 158/77.  Blood sugar was mildly elevated at 197

## 2018-02-06 NOTE — Telephone Encounter (Signed)
   Reason for Disposition . [1] Numbness (i.e., loss of sensation) of the face, arm / hand, or leg / foot on one side of the body AND [2] sudden onset AND [3] present now  Protocols used: NEUROLOGIC DEFICIT-A-AH  Patient is calling to report that she has drooping in her left mouth and eye. She can not hold liquids in her mouth. Her husband is present in the home and he is instructed to call 911 for transport to the hospital.

## 2018-02-11 ENCOUNTER — Ambulatory Visit: Payer: Self-pay | Admitting: Physician Assistant

## 2018-02-11 ENCOUNTER — Ambulatory Visit: Payer: Self-pay | Admitting: *Deleted

## 2018-02-11 NOTE — Telephone Encounter (Signed)
Pt called with c/o pain in her left ear. She denies drainage, just the tingling in her ear. She denies putting an object in her ear, only ear drops. She denies headache, nausea, left sided pain or weakness. She was dx with Bells palsy last week, which affected her right side. She did not check her b/p today but stated that it was within normal limits yesterday. She also said that she did not check her blood sugar this morning but yesterday it was ok. Reminded her to keep an eye on her b/p and blood sugars. She voiced understanding. Husband is with her now and I told him that I made an appointment for her for today. He said that he would try to get her there. [facility].  Reason for Disposition . Diabetes mellitus or weak immune system (e.g., HIV positive, cancer chemo, splenectomy, organ transplant, chronic steroids)  Answer Assessment - Initial Assessment Questions 1. LOCATION: "Which ear is involved?"     Left ear 2. ONSET: "When did the ear start hurting"      Started on last Wednesday 3. SEVERITY: "How bad is the pain?"  (Scale 1-10; mild, moderate or severe)   - MILD (1-3): doesn't interfere with normal activities    - MODERATE (4-7): interferes with normal activities or awakens from sleep    - SEVERE (8-10): excruciating pain, unable to do any normal activities      mild 4. URI SYMPTOMS: " Do you have a runny nose or cough?"     Cough intermittently at night 5. FEVER: "Do you have a fever?" If so, ask: "What is your temperature, how was it measured, and when did it start?"     No fever 6. CAUSE: "Have you been swimming recently?", "How often do you use Q-TIPS?", "Have you had any recent air travel or scuba diving?"     no 7. OTHER SYMPTOMS: "Do you have any other symptoms?" (e.g., headache, stiff neck, dizziness, vomiting, runny nose, decreased hearing)     Stiff neck, decreased in hearing 8. PREGNANCY: "Is there any chance you are pregnant?" "When was  your last menstrual period?"     no  Protocols used: EARACHE-A-AH

## 2018-02-19 ENCOUNTER — Telehealth: Payer: Self-pay | Admitting: Neurology

## 2018-02-19 NOTE — Telephone Encounter (Signed)
Pt requesting a call back to get her schedule for a hospital f/u. Dr. Oliva Bustardohmeier's next available appt isnt until August

## 2018-02-20 ENCOUNTER — Inpatient Hospital Stay: Payer: Medicare Other | Admitting: Physician Assistant

## 2018-02-25 ENCOUNTER — Inpatient Hospital Stay: Payer: Medicare Other | Admitting: Physician Assistant

## 2018-02-25 DIAGNOSIS — M25561 Pain in right knee: Secondary | ICD-10-CM | POA: Diagnosis not present

## 2018-02-25 DIAGNOSIS — M25562 Pain in left knee: Secondary | ICD-10-CM | POA: Diagnosis not present

## 2018-02-25 DIAGNOSIS — M170 Bilateral primary osteoarthritis of knee: Secondary | ICD-10-CM | POA: Diagnosis not present

## 2018-02-27 ENCOUNTER — Other Ambulatory Visit: Payer: Self-pay | Admitting: Physician Assistant

## 2018-02-27 ENCOUNTER — Inpatient Hospital Stay: Payer: Medicare Other | Admitting: Physician Assistant

## 2018-02-27 DIAGNOSIS — F0630 Mood disorder due to known physiological condition, unspecified: Secondary | ICD-10-CM

## 2018-02-27 MED ORDER — AMPHETAMINE-DEXTROAMPHETAMINE 20 MG PO TABS: 20.0000 mg | ORAL_TABLET | Freq: Two times a day (BID) | ORAL | 0 refills | Status: DC | PRN

## 2018-02-27 NOTE — Telephone Encounter (Signed)
Copied from CRM [phone]. Topic: Quick Communication - Rx Refill/Question >> Feb 27, 2018  1:09 PM Arlyss Gandyichardson, Arthi Mcdonald N, NT wrote: Medication: amphetamine-dextroamphetamine (ADDERALL) 20 MG tablet  Has the patient contacted their pharmacy? Yes.   (Agent: If no, request that the patient contact the pharmacy for the refill.) Preferred Pharmacy (with phone number or street name): [facility] [address] Agent: Please be advised that RX refills may take up to 3 business days. We ask that you follow-up with your pharmacy.

## 2018-02-27 NOTE — Telephone Encounter (Signed)
Please be sure this pt has appt with me for hospital f/u. Thank you!

## 2018-02-27 NOTE — Telephone Encounter (Signed)
LOV  01/20/18 [facility]

## 2018-03-02 NOTE — Telephone Encounter (Signed)
Pt has appt 4/12 with McVey

## 2018-03-06 ENCOUNTER — Inpatient Hospital Stay: Payer: Medicare Other | Admitting: Physician Assistant

## 2018-03-16 ENCOUNTER — Encounter (HOSPITAL_COMMUNITY): Payer: Self-pay

## 2018-03-16 ENCOUNTER — Inpatient Hospital Stay (HOSPITAL_COMMUNITY)
Admission: EM | Admit: 2018-03-16 | Discharge: 2018-03-18 | DRG: 683 | Disposition: A | Discharge status: Home Health | Payer: Medicare Other | Attending: Internal Medicine | Admitting: Internal Medicine

## 2018-03-16 DIAGNOSIS — I10 Essential (primary) hypertension: Secondary | ICD-10-CM | POA: Diagnosis present

## 2018-03-16 DIAGNOSIS — E785 Hyperlipidemia, unspecified: Secondary | ICD-10-CM | POA: Diagnosis present

## 2018-03-16 DIAGNOSIS — G894 Chronic pain syndrome: Secondary | ICD-10-CM | POA: Diagnosis present

## 2018-03-16 DIAGNOSIS — R531 Weakness: Secondary | ICD-10-CM

## 2018-03-16 DIAGNOSIS — Q600 Renal agenesis, unilateral: Secondary | ICD-10-CM

## 2018-03-16 DIAGNOSIS — Z7982 Long term (current) use of aspirin: Secondary | ICD-10-CM | POA: Diagnosis not present

## 2018-03-16 DIAGNOSIS — Z8249 Family history of ischemic heart disease and other diseases of the circulatory system: Secondary | ICD-10-CM

## 2018-03-16 DIAGNOSIS — Z794 Long term (current) use of insulin: Secondary | ICD-10-CM | POA: Diagnosis not present

## 2018-03-16 DIAGNOSIS — R52 Pain, unspecified: Secondary | ICD-10-CM | POA: Diagnosis not present

## 2018-03-16 DIAGNOSIS — F419 Anxiety disorder, unspecified: Secondary | ICD-10-CM | POA: Diagnosis present

## 2018-03-16 DIAGNOSIS — N179 Acute kidney failure, unspecified: Principal | ICD-10-CM | POA: Diagnosis present

## 2018-03-16 DIAGNOSIS — E119 Type 2 diabetes mellitus without complications: Secondary | ICD-10-CM

## 2018-03-16 DIAGNOSIS — M797 Fibromyalgia: Secondary | ICD-10-CM | POA: Diagnosis present

## 2018-03-16 DIAGNOSIS — Z955 Presence of coronary angioplasty implant and graft: Secondary | ICD-10-CM

## 2018-03-16 DIAGNOSIS — R29810 Facial weakness: Secondary | ICD-10-CM | POA: Diagnosis not present

## 2018-03-16 DIAGNOSIS — I2510 Atherosclerotic heart disease of native coronary artery without angina pectoris: Secondary | ICD-10-CM | POA: Diagnosis present

## 2018-03-16 DIAGNOSIS — E860 Dehydration: Secondary | ICD-10-CM | POA: Diagnosis not present

## 2018-03-16 DIAGNOSIS — Z79890 Hormone replacement therapy (postmenopausal): Secondary | ICD-10-CM | POA: Diagnosis not present

## 2018-03-16 DIAGNOSIS — R55 Syncope and collapse: Secondary | ICD-10-CM | POA: Diagnosis present

## 2018-03-16 DIAGNOSIS — I6789 Other cerebrovascular disease: Secondary | ICD-10-CM | POA: Diagnosis not present

## 2018-03-16 DIAGNOSIS — E039 Hypothyroidism, unspecified: Secondary | ICD-10-CM | POA: Diagnosis present

## 2018-03-16 DIAGNOSIS — R42 Dizziness and giddiness: Secondary | ICD-10-CM | POA: Diagnosis not present

## 2018-03-16 DIAGNOSIS — Z833 Family history of diabetes mellitus: Secondary | ICD-10-CM

## 2018-03-16 LAB — BASIC METABOLIC PANEL
Anion gap: 12 (ref 5–15)
BUN: 66 mg/dL — AB (ref 6–20)
CO2: 22 mmol/L (ref 22–32)
CREATININE: 1.78 mg/dL — AB (ref 0.44–1.00)
Calcium: 8.9 mg/dL (ref 8.9–10.3)
Chloride: 101 mmol/L (ref 101–111)
GFR calc Af Amer: 31 mL/min — ABNORMAL LOW (ref >60)
GFR calc non Af Amer: 26 mL/min — ABNORMAL LOW (ref >60)
GLUCOSE: 250 mg/dL — AB (ref 65–99)
POTASSIUM: 4.3 mmol/L (ref 3.5–5.1)
Sodium: 135 mmol/L (ref 135–145)

## 2018-03-16 LAB — CBC
HEMATOCRIT: 37.5 % (ref 36.0–46.0)
Hemoglobin: 12 g/dL (ref 12.0–15.0)
MCH: 26.7 pg (ref 26.0–34.0)
MCHC: 32 g/dL (ref 30.0–36.0)
MCV: 83.5 fL (ref 78.0–100.0)
PLATELETS: 259 10*3/uL (ref 150–400)
RBC: 4.49 MIL/uL (ref 3.87–5.11)
RDW: 14.6 % (ref 11.5–15.5)
WBC: 11.9 10*3/uL — ABNORMAL HIGH (ref 4.0–10.5)

## 2018-03-16 LAB — CBG MONITORING, ED: Glucose-Capillary: 246 mg/dL — ABNORMAL HIGH (ref 65–99)

## 2018-03-16 MED ORDER — PROCHLORPERAZINE EDISYLATE 10 MG/2ML IJ SOLN
5.0000 mg | Freq: Once | INTRAMUSCULAR | Status: AC
  Administered 2018-03-16: 5 mg via INTRAVENOUS
  Filled 2018-03-16: qty 2

## 2018-03-16 MED ORDER — LORATADINE 10 MG PO TABS: 10.0000 mg | ORAL_TABLET | Freq: Every day | ORAL | Status: DC | PRN

## 2018-03-16 MED ORDER — INSULIN GLARGINE 100 UNIT/ML ~~LOC~~ SOLN
24.0000 [IU] | Freq: Every day | SUBCUTANEOUS | Status: DC
  Administered 2018-03-17 (×2): 24 [IU] via SUBCUTANEOUS
  Filled 2018-03-16 (×3): qty 0.24

## 2018-03-16 MED ORDER — SODIUM CHLORIDE 0.9 % IV SOLN
INTRAVENOUS | Status: DC
  Administered 2018-03-17 – 2018-03-18 (×4): via INTRAVENOUS

## 2018-03-16 MED ORDER — VENLAFAXINE HCL 50 MG PO TABS
100.0000 mg | ORAL_TABLET | Freq: Every day | ORAL | Status: DC
  Administered 2018-03-17 – 2018-03-18 (×2): 100 mg via ORAL
  Filled 2018-03-16 (×3): qty 2

## 2018-03-16 MED ORDER — ACETAMINOPHEN 650 MG RE SUPP: 650.0000 mg | Freq: Four times a day (QID) | RECTAL | Status: DC | PRN

## 2018-03-16 MED ORDER — ONDANSETRON HCL 4 MG PO TABS: 4.0000 mg | ORAL_TABLET | Freq: Four times a day (QID) | ORAL | Status: DC | PRN

## 2018-03-16 MED ORDER — SODIUM CHLORIDE 0.9 % IV BOLUS
1000.0000 mL | Freq: Once | INTRAVENOUS | Status: AC
  Administered 2018-03-16: 1000 mL via INTRAVENOUS

## 2018-03-16 MED ORDER — ALBUTEROL SULFATE (2.5 MG/3ML) 0.083% IN NEBU: 2.5000 mg | INHALATION_SOLUTION | RESPIRATORY_TRACT | Status: DC | PRN

## 2018-03-16 MED ORDER — DICLOFENAC SODIUM 1 % TD GEL: 4.0000 g | Freq: Four times a day (QID) | TRANSDERMAL | Status: DC | PRN

## 2018-03-16 MED ORDER — METHOCARBAMOL 500 MG PO TABS: 500.0000 mg | ORAL_TABLET | Freq: Two times a day (BID) | ORAL | Status: DC | PRN

## 2018-03-16 MED ORDER — TRIAMCINOLONE ACETONIDE 0.025 % EX CREA: 1.0000 "application " | TOPICAL_CREAM | Freq: Every day | CUTANEOUS | Status: DC | PRN

## 2018-03-16 MED ORDER — DIAZEPAM 5 MG PO TABS
5.0000 mg | ORAL_TABLET | Freq: Two times a day (BID) | ORAL | Status: DC | PRN
Start: 2018-03-16 — End: 2018-03-18

## 2018-03-16 MED ORDER — ACETAMINOPHEN 325 MG PO TABS: 650.0000 mg | ORAL_TABLET | Freq: Four times a day (QID) | ORAL | Status: DC | PRN

## 2018-03-16 MED ORDER — ENOXAPARIN SODIUM 30 MG/0.3ML ~~LOC~~ SOLN
30.0000 mg | SUBCUTANEOUS | Status: DC
  Administered 2018-03-17: 30 mg via SUBCUTANEOUS
  Filled 2018-03-16 (×2): qty 0.3

## 2018-03-16 MED ORDER — HYDROCODONE-ACETAMINOPHEN 10-325 MG PO TABS: 1.0000 | ORAL_TABLET | ORAL | Status: DC | PRN

## 2018-03-16 MED ORDER — DIPHENHYDRAMINE HCL 50 MG/ML IJ SOLN
12.5000 mg | Freq: Once | INTRAMUSCULAR | Status: AC
  Administered 2018-03-16: 12.5 mg via INTRAVENOUS
  Filled 2018-03-16: qty 1

## 2018-03-16 MED ORDER — ASPIRIN 81 MG PO CHEW
81.0000 mg | CHEWABLE_TABLET | Freq: Every day | ORAL | Status: DC
  Administered 2018-03-17 – 2018-03-18 (×2): 81 mg via ORAL
  Filled 2018-03-16 (×2): qty 1

## 2018-03-16 MED ORDER — ONDANSETRON HCL 4 MG/2ML IJ SOLN: 4.0000 mg | Freq: Four times a day (QID) | INTRAMUSCULAR | Status: DC | PRN

## 2018-03-16 MED ORDER — LEVOTHYROXINE SODIUM 50 MCG PO TABS
50.0000 ug | ORAL_TABLET | Freq: Every day | ORAL | Status: DC
  Administered 2018-03-17 – 2018-03-18 (×2): 50 ug via ORAL
  Filled 2018-03-16 (×2): qty 1

## 2018-03-16 MED ORDER — MECLIZINE HCL 25 MG PO TABS
25.0000 mg | ORAL_TABLET | Freq: Once | ORAL | Status: AC
  Administered 2018-03-16: 25 mg via ORAL
  Filled 2018-03-16: qty 1

## 2018-03-16 MED ORDER — DIPHENHYDRAMINE HCL 25 MG PO CAPS: 25.0000 mg | ORAL_CAPSULE | Freq: Four times a day (QID) | ORAL | Status: DC | PRN

## 2018-03-16 NOTE — ED Triage Notes (Signed)
Pt. From home via EMS with reports of dizziness and issues with her right ear. Pt. Reports having severe dizziness and having lower herself to the floor. Pt. Has hx. Of bells palsy that effects the right side.   A/0 X4. CBG 222

## 2018-03-16 NOTE — ED Provider Notes (Signed)
[facility] EMERGENCY DEPARTMENT Provider Note   CSN: [id number] Arrival date & time: 03/16/18  2050     History   Chief Complaint No chief complaint on file.   HPI [name] is a 77 y.o. female.  77 yo F with a chief complaint of dizziness.  She is unable to quantify this.  She felt unsteady on her feet earlier today and had to lower herself to the ground.  She then had some difficulty getting up.  She states she has dizziness at baseline and this is been an ongoing issue for her.  She denies head injury and loss of consciousness denies back pain neck pain abdominal pain that are new from chronic.  She has been getting injections into her knees which she thinks has been helping her chronic depression and chronic fatigue syndrome.  She had some pain to the musculature of the right side of her neck which she has been applying lidocaine to and had some improvement over the past 3 or 4 weeks.  She has had trouble ambulating at home for many years and has had falls over the past 3 or 4 years.  She feels that her unsteadiness is somewhat worse today and so came to the emergency department for evaluation.  She denies chest pain shortness of breath vomiting or diarrhea.  The history is provided by the patient.  Illness  This is a new problem. The current episode started yesterday. The problem occurs constantly. The problem has not changed since onset.Pertinent negatives include no chest pain, no headaches and no shortness of breath. Nothing aggravates the symptoms. Nothing relieves the symptoms. She has tried nothing for the symptoms. The treatment provided no relief.    Past Medical History:  Diagnosis Date  . Abdominal discomfort    "due to medication intolerance"  . CAD (coronary artery disease)    previous stent  . Chronic pain syndrome 09/10/2013   Dr Nelva Bush,   . [place] Patient virus infection 1988  . Excessive daytime sleepiness 12/05/2014   Patient has not been  seen for a sleep study as ordered, and used adderall to keep alert in daytime.  She agreed  In a contract not to have any scheduled medication for pain treatment from [facility] and will not receive refills for Adderall, which was initiated by dr Orland Penman, PCP>   . Fall   . Hyperlipemia   . Hypersomnia, persistent 09/10/2013   Patient on  Stimulants .  Marland Kitchen Hypertension   . Narcotic addiction ([place]) 12/05/2014  . Obesity, unspecified 09/10/2013  . Shoulder joint pain    both shoulders    Patient Active Problem List   Diagnosis Date Noted  . Hyperglycemia 09/25/2017  . Type 2 diabetes mellitus with complication, without long-term current use of insulin ([place]) 04/24/2017  . ADD (attention deficit disorder) 12/30/2016  . Narcotic addiction ([place]) 12/05/2014  . Excessive daytime sleepiness 12/05/2014  . Alteration in psychomotor activity 12/05/2014  . Depression with somatization 12/05/2014  . Hypersomnia, persistent 09/10/2013  . Obesity, unspecified 09/10/2013  . Chronic pain syndrome 09/10/2013  . Depression 07/31/2013  . Medication intolerance 07/19/2013  . Anxiety 07/19/2013  . CAD (coronary artery disease) status post stent to distal right coronary artery 2006 06/20/2013  . Neurocardiogenic syncope 06/20/2013  . Hyperlipidemia 06/20/2013    Past Surgical History:  Procedure Laterality Date  . ANGIOPLASTY  03/01/2002   cutting balloon mid RCA  . BACK SURGERY    . CARDIAC CATHETERIZATION  01/26/2007   mild diffuse CAD  . CARDIAC CATHETERIZATION  10/23/2009   nonobstructive CAD,60% prox RCA,50% prox LAD, 50% mid ramus  . CORONARY STENT PLACEMENT  03/15/2005   distal RCA  . NEPHRECTOMY       OB History    Gravida  2   Para  2   Term  2   Preterm      AB      Living  2     SAB      TAB      Ectopic      Multiple      Live Births               Home Medications    Prior to Admission medications   Medication Sig Start Date End Date Taking? Authorizing Provider    amphetamine-dextroamphetamine (ADDERALL) 20 MG tablet Take 1 tablet (20 mg total) by mouth 2 (two) times daily as needed. Patient taking differently: Take 20 mg by mouth 2 (two) times daily as needed (chronic fatigue syndrome).  02/27/18 03/29/18 Yes McVey, Gelene Mink, PA-C  atenolol (TENORMIN) 25 MG tablet TAKE 1 TABLET BY MOUTH IN THE MORNING AND 2 IN THE EVENING 12/10/17  Yes McVey, Gelene Mink, PA-C  diazepam (VALIUM) 5 MG tablet Take 5 mg by mouth every 12 (twelve) hours as needed for anxiety.    Yes [provider]  hydrochlorothiazide (HYDRODIURIL) 25 MG tablet Take 25 mg by mouth daily. 02/26/18  Yes [provider]  HYDROcodone-acetaminophen (NORCO) 10-325 MG per tablet Take 1-2 tablets by mouth every 4 (four) hours as needed for moderate pain.  09/28/14  Yes [provider]  Insulin Glargine (LANTUS SOLOSTAR) 100 UNIT/ML Solostar Pen Inject 24 Units into the skin daily at 10 pm. 10/24/17  Yes McVey, Gelene Mink, PA-C  levothyroxine (SYNTHROID, LEVOTHROID) 50 MCG tablet Take 1 tablet (50 mcg total) by mouth daily before breakfast. 03/07/17  Yes McVey, Gelene Mink, PA-C  lisinopril (PRINIVIL,ZESTRIL) 20 MG tablet TAKE 1 TABLET BY MOUTH IN THE MORNING AND 1/2 (ONE-HALF) AT BEDTIME 08/02/17  Yes McVey, Gelene Mink, PA-C  potassium chloride (K-DUR,KLOR-CON) 10 MEQ tablet TAKE 1 TAB (10 MEQ TOTAL) BY MOUTH AS NEEDED (ONLY TAKES IF SHE DOES NOT EAT POTASSIUM RICH FOODS) Patient taking differently: TAKE 1 TAB (10 MEQ TOTAL) BY MOUTH AS NEEDED (IF YOU DO NOT EAT POTTASIUM RICH FOOD) 08/02/17  Yes McVey, Gelene Mink, PA-C  venlafaxine (EFFEXOR) 100 MG tablet Take 1 tablet (100 mg total) by mouth daily. 01/21/18  Yes McVey, Gelene Mink, PA-C  ACCU-CHEK SOFTCLIX LANCETS lancets USE TO TEST FOUR TIMES DAILY 12/02/17   McVey, Gelene Mink, PA-C  Alfalfa 250 MG TABS Take 1 tablet by mouth every morning.    [provider]  aspirin 81 MG  tablet Take 81 mg by mouth daily.    [provider]  blood glucose meter kit and supplies Dispense based on patient and insurance preference. Use up to four times daily as directed. (FOR ICD-9 250.00, 250.01). 09/27/17   Oswald Hillock, MD  Cholecalciferol (VITAMIN D3) 5000 units TBDP Take 1 capsule by mouth daily.    [provider]  CHROMIUM ASPARTATE PO Take 1 tablet by mouth every morning.    [provider]  Cinnamon Bark POWD Take 1 Dose by mouth daily.    [provider]  dextromethorphan (DELSYM) 30 MG/5ML liquid Take 30 mg by mouth daily as needed for cough.    [provider]  DHA-EPA-Coenzyme Q10-Vitamin E (GNP COQ-10 & FISH OIL) 120-180-50-30 CAPS Take 3 tablets by mouth daily.     [provider]  diclofenac sodium (VOLTAREN) 1 % GEL Apply 4 g topically 4 (four) times daily. 01/09/18   Duffy Bruce, MD  diphenhydrAMINE (BENADRYL) 25 MG tablet Take 25 mg by mouth every 6 (six) hours as needed for itching.    [provider]  fexofenadine (ALLEGRA) 180 MG tablet Take 180 mg by mouth daily as needed for allergies.     [provider]  glucose 4 GM chewable tablet Chew 1 tablet by mouth as needed for low blood sugar.    [provider]  glucose blood (ACCU-CHEK AVIVA PLUS) test strip USE TO TEST FOUR TIMES DAILY 12/02/17   McVey, Gelene Mink, PA-C  hydrochlorothiazide (MICROZIDE) 12.5 MG capsule Take 1 capsule (12.5 mg total) by mouth daily. Patient not taking: Reported on 03/16/2018 09/28/17   Oswald Hillock, MD  HYDROCORTISONE, TOPICAL, 2 % LOTN Apply 1 application topically 2 (two) times daily. 01/19/18   McVey, Gelene Mink, PA-C  ibuprofen (ADVIL,MOTRIN) 200 MG tablet Take 200 mg by mouth every 6 (six) hours as needed for moderate pain.    [provider]  Insulin Syringe-Needle U-100 (INSULIN SYRINGE .5CC/31GX5/16") 31G X 5/16" 0.5 ML MISC Use as directed 09/27/17   Oswald Hillock, MD    lidocaine (LIDODERM) 5 % Place 1 patch onto the skin daily. Remove & Discard patch within 12 hours or as directed by MD 01/09/18   Duffy Bruce, MD  lisdexamfetamine (VYVANSE) 30 MG capsule Take 1 capsule (30 mg total) by mouth daily. Patient not taking: Reported on 03/16/2018 10/28/17   McVey, Gelene Mink, PA-C  magnesium oxide (MAG-OX) 400 MG tablet Take 400 mg by mouth 2 (two) times daily.    [provider]  metFORMIN (GLUCOPHAGE) 500 MG tablet Take 1 tablet (500 mg total) by mouth 2 (two) times daily with a meal. Wk1: 1/2 tab 2x/day; Wk2: 1 tab am, 1/2 tab pm; Wk3: 2 tabs 2x/day. 09/27/17   Oswald Hillock, MD  methocarbamol (ROBAXIN) 500 MG tablet Take 1 tablet (500 mg total) by mouth 2 (two) times daily as needed for muscle spasms. 01/09/18   Duffy Bruce, MD  Misc. Devices (HUGO ROLLING WALKER ELITE) MISC 1 Units by Does not apply route daily as needed. 11/29/16   McVey, Gelene Mink, PA-C  triamcinolone (KENALOG) 0.025 % ointment Apply 1 application topically 2 (two) times daily. Patient taking differently: Apply 1 application topically daily as needed (itching).  12/02/17   McVey, Gelene Mink, PA-C    Family History Family History  Problem Relation Age of Onset  . Hypertension Mother   . Diabetes Father   . Heart attack Father   . Heart attack Brother     Social History Social History   Tobacco Use  . Smoking status: Never Smoker  . Smokeless tobacco: Never Used  Substance Use Topics  . Alcohol use: No  . Drug use: No     Allergies   Azithromycin; Cardizem [diltiazem hcl]; Codeine; Coreg [carvedilol]; Demerol [meperidine]; Erythromycin; Inderal [propranolol]; Procardia [nifedipine]; Wellbutrin [bupropion]; and Zoloft [sertraline hcl]   Review of Systems Review of Systems  Constitutional: Negative for chills and fever.  HENT: Negative for congestion and rhinorrhea.   Eyes: Negative for redness and visual disturbance.  Respiratory: Negative for  shortness of breath and wheezing.   Cardiovascular: Negative for chest pain and palpitations.  Gastrointestinal:  Negative for nausea and vomiting.  Genitourinary: Negative for dysuria and urgency.  Musculoskeletal: Negative for arthralgias and myalgias.  Skin: Negative for pallor and wound.  Neurological: Positive for dizziness. Negative for headaches.     Physical Exam Updated Vital Signs BP 121/66   Pulse 62   Temp (!) 97.5 F (36.4 C) (Oral)   Resp 15   SpO2 98%   Physical Exam  Constitutional: She is oriented to person, place, and time. She appears well-developed and well-nourished. No distress.  HENT:  Head: Normocephalic and atraumatic.  R TM is normal, L TM normal.   No pain on palpation of the temporal artery.  No noted midline spinal tenderness. No noted pain to the right lateral neck  Eyes: Pupils are equal, round, and reactive to light. EOM are normal.  Neck: Normal range of motion. Neck supple.  Cardiovascular: Normal rate and regular rhythm. Exam reveals no gallop and no friction rub.  No murmur heard. Pulmonary/Chest: Effort normal. She has no wheezes. She has no rales.  Abdominal: Soft. She exhibits no distension. There is no tenderness.  Musculoskeletal: She exhibits no edema or tenderness.  Neurological: She is alert and oriented to person, place, and time. She has normal strength. A cranial nerve deficit is present. No sensory deficit. Coordination and gait normal. GCS eye subscore is 4. GCS verbal subscore is 5. GCS motor subscore is 6.  R sided facial nerve palsy.  No other noted neuro deficits.  No change in coordination, no nystagmus.   Skin: Skin is warm and dry. She is not diaphoretic.  Psychiatric: Her behavior is normal. She exhibits a depressed mood.  Nursing note and vitals reviewed.    ED Treatments / Results  Labs (all labs ordered are listed, but only abnormal results are displayed) Labs Reviewed  BASIC METABOLIC PANEL - Abnormal; Notable  for the following components:      Result Value   Glucose, Bld 250 (*)    BUN 66 (*)    Creatinine, Ser 1.78 (*)    GFR calc non Af Amer 26 (*)    GFR calc Af Amer 31 (*)    All other components within normal limits  CBC - Abnormal; Notable for the following components:   WBC 11.9 (*)    All other components within normal limits  CBG MONITORING, ED - Abnormal; Notable for the following components:   Glucose-Capillary 246 (*)    All other components within normal limits  URINALYSIS, ROUTINE W REFLEX MICROSCOPIC  CREATININE, URINE, RANDOM  SODIUM, URINE, RANDOM    EKG EKG Interpretation  Date/Time:  Monday March 16 2018 20:53:55 EDT Ventricular Rate:  63 PR Interval:    QRS Duration: 109 QT Interval:  453 QTC Calculation: 464 R Axis:   48 Text Interpretation:  Sinus rhythm new downward sloping  st waves in lateral distribution Otherwise no significant change Confirmed by Deno Etienne [phone]) on 03/16/2018 9:30:34 PM   Radiology No results found.  Procedures Procedures (including critical care time)  Medications Ordered in ED Medications  sodium chloride 0.9 % bolus 1,000 mL (has no administration in time range)  prochlorperazine (COMPAZINE) injection 5 mg (5 mg Intravenous Given 03/16/18 2129)  diphenhydrAMINE (BENADRYL) injection 12.5 mg (12.5 mg Intravenous Given 03/16/18 2129)  meclizine (ANTIVERT) tablet 25 mg (25 mg Oral Given 03/16/18 0000)  sodium chloride 0.9 % bolus 1,000 mL (1,000 mLs Intravenous New Bag/Given 03/16/18 2228)     Initial Impression / Assessment and Plan / ED  Course  I have reviewed the triage vital signs and the nursing notes.  Pertinent labs & imaging results that were available during my care of the patient were reviewed by me and considered in my medical decision making (see chart for details).     77 yo F with a cc of dizziness.  Chronic issue for her.  Mildly worsening today.  Some neck pain a couple weeks ago but has resolved. No noted  neuro deficit other than her bells palsy.  Will treat symptomatically and reeval.  Labs ordered by triage.   The patient was given Compazine and Benadryl and meclizine with some improvement of her dizziness however the patient is more sleepy with the Benadryl administration.  I discussed the lab results with her that her renal function had gone from 0.6-1.8.  The patient at this point would like to be kept overnight and continue to be hydrated and reassessed in the morning.  She states she has not been eating and drinking well for the past few days but she is unable to quantify why.  Give fluid bolus, discussed with hospitalist.   The patients results and plan were reviewed and discussed.   Any x-rays performed were independently reviewed by myself.   Differential diagnosis were considered with the presenting HPI.  Medications  sodium chloride 0.9 % bolus 1,000 mL (has no administration in time range)  prochlorperazine (COMPAZINE) injection 5 mg (5 mg Intravenous Given 03/16/18 2129)  diphenhydrAMINE (BENADRYL) injection 12.5 mg (12.5 mg Intravenous Given 03/16/18 2129)  meclizine (ANTIVERT) tablet 25 mg (25 mg Oral Given 03/16/18 0000)  sodium chloride 0.9 % bolus 1,000 mL (1,000 mLs Intravenous New Bag/Given 03/16/18 2228)    Vitals:   03/16/18 2100 03/16/18 2115 03/16/18 2130 03/16/18 2300  BP: (!) 101/43 (!) 107/53 (!) 109/42 121/66  Pulse: 64 64 64 62  Resp: (!) '21 15 11 15  ' Temp:      TempSrc:      SpO2: 95% 97% 100% 98%    Final diagnoses:  AKI (acute kidney injury) ([place])    Admission/ observation were discussed with the admitting physician, patient and/or family and they are comfortable with the plan.    Final Clinical Impressions(s) / ED Diagnoses   Final diagnoses:  AKI (acute kidney injury) [facility])    ED Discharge Orders    None       Deno Etienne, DO 03/16/18 2318

## 2018-03-16 NOTE — H&P (Signed)
History and Physical    [name] OVF:[id number] DOB: [birth year] DOA: 03/16/2018  Referring MD/NP/PA: Dr. Deno Etienne PCP: Magda Kiel Gelene Mink, PA-C  Patient coming from: home  Chief Complaint: Lightheadness  I have personally briefly reviewed patient's old medical records in [place]   HPI: [name] is a 77 y.o. female with medical history significant of HTN, chronic pain, Epstein-Barr virus infection, Bell's palsy, and solitary kidney since birth; who presents with complaints of lightheadedness.  History is noted to be difficult to obtain from the patient.  Patient makes it seem that symptoms have been going on for months.  Today she reports sliding down to the floor due to her feeling as though she was going to pass out, but denies any loss of consciousness.  Denies any trauma to her head, chest pain, shortness of breath, vomiting, diarrhea, or constipation symptoms.  She complains of generalized malaise, generalized weakness, poor appetite, and that she is unable to do anything for herself at home.  She lives with her husband, but notes that he has memory issues, and does the best that he can to help take care of her.  Patient was diagnosed with Bell's palsy last month after being seen in the emergency department.  ED Course: Upon admission into the emergency department patient was seen to be afebrile, with vital signs relatively within normal limits.  Labs revealed WBC 11.9, hemoglobin 12 BUN 66, creatinine 1.78, and glucose 250.   Review of Systems  Constitutional: Positive for malaise/fatigue.  Eyes: Negative for photophobia and discharge.  Respiratory: Negative for cough and shortness of breath.   Cardiovascular: Negative for chest pain and leg swelling.  Gastrointestinal: Negative for abdominal pain, constipation, diarrhea and vomiting.  Genitourinary: Negative for dysuria and hematuria.  Musculoskeletal: Positive for falls.  Neurological: Positive for  dizziness, focal weakness (Secondary to Bell's palsy) and weakness.  Psychiatric/Behavioral: Negative for memory loss and substance abuse.    Past Medical History:  Diagnosis Date  . Abdominal discomfort    "due to medication intolerance"  . CAD (coronary artery disease)    previous stent  . Chronic pain syndrome 09/10/2013   Dr Nelva Bush,   . [place] Patient virus infection 1988  . Excessive daytime sleepiness 12/05/2014   Patient has not been seen for a sleep study as ordered, and used adderall to keep alert in daytime.  She agreed  In a contract not to have any scheduled medication for pain treatment from [place] and will not receive refills for Adderall, which was initiated by dr Orland Penman, PCP>   . Fall   . Hyperlipemia   . Hypersomnia, persistent 09/10/2013   Patient on  Stimulants .  Marland Kitchen Hypertension   . Narcotic addiction ([place]) 12/05/2014  . Obesity, unspecified 09/10/2013  . Shoulder joint pain    both shoulders    Past Surgical History:  Procedure Laterality Date  . ANGIOPLASTY  03/01/2002   cutting balloon mid RCA  . BACK SURGERY    . CARDIAC CATHETERIZATION  01/26/2007   mild diffuse CAD  . CARDIAC CATHETERIZATION  10/23/2009   nonobstructive CAD,60% prox RCA,50% prox LAD, 50% mid ramus  . CORONARY STENT PLACEMENT  03/15/2005   distal RCA  . NEPHRECTOMY       reports that she has never smoked. She has never used smokeless tobacco. She reports that she does not drink alcohol or use drugs.  Allergies  Allergen Reactions  . Azithromycin Nausea And Vomiting  . Cardizem [Diltiazem  Hcl] Nausea And Vomiting  . Codeine Other (See Comments)    Feeling weird   . Coreg [Carvedilol] Nausea And Vomiting  . Demerol [Meperidine] Other (See Comments)    Per pt disoriented  . Erythromycin Nausea And Vomiting    Very sick  . Inderal [Propranolol] Other (See Comments)    Made me cry  . Procardia [Nifedipine] Other (See Comments)    Disoriented, sick  . Wellbutrin [Bupropion] Other (See  Comments)    Unknown reaction  . Zoloft [Sertraline Hcl] Other (See Comments)    Could not talk, stiff    Family History  Problem Relation Age of Onset  . Hypertension Mother   . Diabetes Father   . Heart attack Father   . Heart attack Brother     Prior to Admission medications   Medication Sig Start Date End Date Taking? Authorizing Provider  amphetamine-dextroamphetamine (ADDERALL) 20 MG tablet Take 1 tablet (20 mg total) by mouth 2 (two) times daily as needed. Patient taking differently: Take 20 mg by mouth 2 (two) times daily as needed (chronic fatigue syndrome).  02/27/18 03/29/18 Yes McVey, Gelene Mink, PA-C  atenolol (TENORMIN) 25 MG tablet TAKE 1 TABLET BY MOUTH IN THE MORNING AND 2 IN THE EVENING 12/10/17  Yes McVey, Gelene Mink, PA-C  diazepam (VALIUM) 5 MG tablet Take 5 mg by mouth every 12 (twelve) hours as needed for anxiety.    Yes [provider]  hydrochlorothiazide (HYDRODIURIL) 25 MG tablet Take 25 mg by mouth daily. 02/26/18  Yes [provider]  HYDROcodone-acetaminophen (NORCO) 10-325 MG per tablet Take 1-2 tablets by mouth every 4 (four) hours as needed for moderate pain.  09/28/14  Yes [provider]  Insulin Glargine (LANTUS SOLOSTAR) 100 UNIT/ML Solostar Pen Inject 24 Units into the skin daily at 10 pm. 10/24/17  Yes McVey, Gelene Mink, PA-C  levothyroxine (SYNTHROID, LEVOTHROID) 50 MCG tablet Take 1 tablet (50 mcg total) by mouth daily before breakfast. 03/07/17  Yes McVey, Gelene Mink, PA-C  lisinopril (PRINIVIL,ZESTRIL) 20 MG tablet TAKE 1 TABLET BY MOUTH IN THE MORNING AND 1/2 (ONE-HALF) AT BEDTIME 08/02/17  Yes McVey, Gelene Mink, PA-C  potassium chloride (K-DUR,KLOR-CON) 10 MEQ tablet TAKE 1 TAB (10 MEQ TOTAL) BY MOUTH AS NEEDED (ONLY TAKES IF SHE DOES NOT EAT POTASSIUM RICH FOODS) Patient taking differently: TAKE 1 TAB (10 MEQ TOTAL) BY MOUTH AS NEEDED (IF YOU DO NOT EAT POTTASIUM RICH FOOD) 08/02/17  Yes McVey,  Gelene Mink, PA-C  venlafaxine (EFFEXOR) 100 MG tablet Take 1 tablet (100 mg total) by mouth daily. 01/21/18  Yes McVey, Gelene Mink, PA-C  ACCU-CHEK SOFTCLIX LANCETS lancets USE TO TEST FOUR TIMES DAILY 12/02/17   McVey, Gelene Mink, PA-C  Alfalfa 250 MG TABS Take 1 tablet by mouth every morning.    [provider]  aspirin 81 MG tablet Take 81 mg by mouth daily.    [provider]  blood glucose meter kit and supplies Dispense based on patient and insurance preference. Use up to four times daily as directed. (FOR ICD-9 250.00, 250.01). 09/27/17   Oswald Hillock, MD  Cholecalciferol (VITAMIN D3) 5000 units TBDP Take 1 capsule by mouth daily.    [provider]  CHROMIUM ASPARTATE PO Take 1 tablet by mouth every morning.    [provider]  Cinnamon Bark POWD Take 1 Dose by mouth daily.    [provider]  dextromethorphan (DELSYM) 30 MG/5ML liquid Take 30 mg by mouth daily as  needed for cough.    [provider]  DHA-EPA-Coenzyme Q10-Vitamin E (GNP COQ-10 & FISH OIL) 120-180-50-30 CAPS Take 3 tablets by mouth daily.     [provider]  diclofenac sodium (VOLTAREN) 1 % GEL Apply 4 g topically 4 (four) times daily. 01/09/18   Duffy Bruce, MD  diphenhydrAMINE (BENADRYL) 25 MG tablet Take 25 mg by mouth every 6 (six) hours as needed for itching.    [provider]  fexofenadine (ALLEGRA) 180 MG tablet Take 180 mg by mouth daily as needed for allergies.     [provider]  glucose 4 GM chewable tablet Chew 1 tablet by mouth as needed for low blood sugar.    [provider]  glucose blood (ACCU-CHEK AVIVA PLUS) test strip USE TO TEST FOUR TIMES DAILY 12/02/17   McVey, Gelene Mink, PA-C  hydrochlorothiazide (MICROZIDE) 12.5 MG capsule Take 1 capsule (12.5 mg total) by mouth daily. Patient not taking: Reported on 03/16/2018 09/28/17   Oswald Hillock, MD  HYDROCORTISONE, TOPICAL, 2 % LOTN Apply 1  application topically 2 (two) times daily. 01/19/18   McVey, Gelene Mink, PA-C  ibuprofen (ADVIL,MOTRIN) 200 MG tablet Take 200 mg by mouth every 6 (six) hours as needed for moderate pain.    [provider]  Insulin Syringe-Needle U-100 (INSULIN SYRINGE .5CC/31GX5/16") 31G X 5/16" 0.5 ML MISC Use as directed 09/27/17   Oswald Hillock, MD  lidocaine (LIDODERM) 5 % Place 1 patch onto the skin daily. Remove & Discard patch within 12 hours or as directed by MD 01/09/18   Duffy Bruce, MD  lisdexamfetamine (VYVANSE) 30 MG capsule Take 1 capsule (30 mg total) by mouth daily. Patient not taking: Reported on 03/16/2018 10/28/17   McVey, Gelene Mink, PA-C  magnesium oxide (MAG-OX) 400 MG tablet Take 400 mg by mouth 2 (two) times daily.    [provider]  metFORMIN (GLUCOPHAGE) 500 MG tablet Take 1 tablet (500 mg total) by mouth 2 (two) times daily with a meal. Wk1: 1/2 tab 2x/day; Wk2: 1 tab am, 1/2 tab pm; Wk3: 2 tabs 2x/day. 09/27/17   Oswald Hillock, MD  methocarbamol (ROBAXIN) 500 MG tablet Take 1 tablet (500 mg total) by mouth 2 (two) times daily as needed for muscle spasms. 01/09/18   Duffy Bruce, MD  Misc. Devices (HUGO ROLLING WALKER ELITE) MISC 1 Units by Does not apply route daily as needed. 11/29/16   McVey, Gelene Mink, PA-C  triamcinolone (KENALOG) 0.025 % ointment Apply 1 application topically 2 (two) times daily. Patient taking differently: Apply 1 application topically daily as needed (itching).  12/02/17   McVey, Gelene Mink, PA-C    Physical Exam:  Constitutional: NAD, calm, comfortable with right-sided facial paralysis noted Vitals:   03/16/18 2100 03/16/18 2115 03/16/18 2130 03/16/18 2300  BP: (!) 101/43 (!) 107/53 (!) 109/42 121/66  Pulse: 64 64 64 62  Resp: (!) _0 Temp:      TempSrc:      SpO2: 95% 97% 100% 98%   Eyes: Pupils appear equal round reactive to light bilaterally.  No significant conjunctival injection noted. ENMT:  Mucous membranes are dry. Posterior pharynx clear of any exudate or lesions. Neck: normal, supple, no masses, no thyromegaly Respiratory: clear to auscultation bilaterally, no wheezing, no crackles. Normal respiratory effort. No accessory muscle use.  Cardiovascular: Regular rate and rhythm, no murmurs / rubs / gallops. No extremity edema. 2+ pedal pulses. No carotid bruits.  Abdomen: no tenderness,  no masses palpated. No hepatosplenomegaly. Bowel sounds positive.  Musculoskeletal: no clubbing / cyanosis. No joint deformity upper and lower extremities. Good ROM, no contractures. Normal muscle tone.  Skin: Poor skin turgor.  No rashes, lesions, ulcers. No induration Neurologic: CN 2-12 grossly intact. Sensation intact, DTR normal. Strength 5/5 in all 4.  Psychiatric: Normal judgment and insight. Alert and oriented x 3. Normal mood.     Labs on Admission: I have personally reviewed following labs and imaging studies  CBC: Recent Labs  Lab 03/16/18 2059  WBC 11.9*  HGB 12.0  HCT 37.5  MCV 83.5  PLT 099   Basic Metabolic Panel: Recent Labs  Lab 03/16/18 2059  NA 135  K 4.3  CL 101  CO2 22  GLUCOSE 250*  BUN 66*  CREATININE 1.78*  CALCIUM 8.9   GFR: CrCl cannot be calculated (Unknown ideal weight.). Liver Function Tests: No results for input(s): AST, ALT, ALKPHOS, BILITOT, PROT, ALBUMIN in the last 168 hours. No results for input(s): LIPASE, AMYLASE in the last 168 hours. No results for input(s): AMMONIA in the last 168 hours. Coagulation Profile: No results for input(s): INR, PROTIME in the last 168 hours. Cardiac Enzymes: No results for input(s): CKTOTAL, CKMB, CKMBINDEX, TROPONINI in the last 168 hours. BNP (last 3 results) No results for input(s): PROBNP in the last 8760 hours. HbA1C: No results for input(s): HGBA1C in the last 72 hours. CBG: Recent Labs  Lab 03/16/18 2136  GLUCAP 246*   Lipid Profile: No results for input(s): CHOL, HDL, LDLCALC, TRIG,  CHOLHDL, LDLDIRECT in the last 72 hours. Thyroid Function Tests: No results for input(s): TSH, T4TOTAL, FREET4, T3FREE, THYROIDAB in the last 72 hours. Anemia Panel: No results for input(s): VITAMINB12, FOLATE, FERRITIN, TIBC, IRON, RETICCTPCT in the last 72 hours. Urine analysis:    Component Value Date/Time   COLORURINE STRAW (A) 09/25/2017 1730   APPEARANCEUR CLEAR 09/25/2017 1730   LABSPEC 1.013 09/25/2017 1730   PHURINE 5.0 09/25/2017 1730   GLUCOSEU >=500 (A) 09/25/2017 1730   HGBUR SMALL (A) 09/25/2017 1730   BILIRUBINUR NEGATIVE 09/25/2017 1730   KETONESUR 5 (A) 09/25/2017 1730   PROTEINUR NEGATIVE 09/25/2017 1730   UROBILINOGEN 0.2 07/15/2013 1819   NITRITE NEGATIVE 09/25/2017 1730   LEUKOCYTESUR MODERATE (A) 09/25/2017 1730   Sepsis Labs: No results found for this or any previous visit (from the past 240 hour(s)).   Radiological Exams on Admission: No results found.  EKG: Independently reviewed.  Sinus rhythm at 63 bpm  Assessment/Plan Acute renal failure: Acute.  Patient baseline creatinine previously noted to be within normal limits, but patient presents with BUN 66 and creatinine 1.78 suspect prerenal cause of symptoms. - Admit to a MedSurg - Follow-up urinalysis - Check FeNa - Check renal ultrasound - Normal saline IV fluids at 125 mL/h overnight as tolerated - Recheck BMP in a.m. - Hold nephrotoxic agents  Near syncope  2/2 dehydration - IV fluids as seen above  Generalized weakness - Physical therapy to eval and treat - Social work consult  Diabetes mellitus type 2 - Hypoglycemic protocol - Hold metformin - Continue home Lantus - CBGs q. before meals and at bedtime with sensitive SSI  Essential hypertension: Patient with initial soft blood pressures. - Hold hydrochlorothiazide and lisinopril  Chronic pain - Continue hydrocodone prn  Hypothyroidism - Add on TSH - Continue levothyroxine  Anxiety - Continue Effexor and Valium as needed    DVT prophylaxis: lovenox   Code Status: full  Family Communication:  No family present bedside Disposition Plan: TBD Consults called: none Admission status: Inpatient  Norval Morton MD [facility] Pager [phone]   If 7PM-7AM, please contact night-coverage [email or web address] Password [facility]  03/16/2018, 11:08 PM

## 2018-03-17 ENCOUNTER — Inpatient Hospital Stay (HOSPITAL_COMMUNITY): Payer: Medicare Other

## 2018-03-17 DIAGNOSIS — R531 Weakness: Secondary | ICD-10-CM

## 2018-03-17 DIAGNOSIS — N179 Acute kidney failure, unspecified: Principal | ICD-10-CM

## 2018-03-17 DIAGNOSIS — E860 Dehydration: Secondary | ICD-10-CM | POA: Diagnosis present

## 2018-03-17 DIAGNOSIS — R55 Syncope and collapse: Secondary | ICD-10-CM | POA: Diagnosis present

## 2018-03-17 LAB — URINALYSIS, ROUTINE W REFLEX MICROSCOPIC
BILIRUBIN URINE: NEGATIVE
Glucose, UA: NEGATIVE mg/dL
Hgb urine dipstick: NEGATIVE
KETONES UR: NEGATIVE mg/dL
Nitrite: NEGATIVE
PH: 5 (ref 5.0–8.0)
Protein, ur: NEGATIVE mg/dL
Specific Gravity, Urine: 1.011 (ref 1.005–1.030)

## 2018-03-17 LAB — CBG MONITORING, ED
GLUCOSE-CAPILLARY: 154 mg/dL — AB (ref 65–99)
Glucose-Capillary: 213 mg/dL — ABNORMAL HIGH (ref 65–99)

## 2018-03-17 LAB — BASIC METABOLIC PANEL
ANION GAP: 7 (ref 5–15)
BUN: 46 mg/dL — ABNORMAL HIGH (ref 6–20)
CHLORIDE: 112 mmol/L — AB (ref 101–111)
CO2: 20 mmol/L — AB (ref 22–32)
CREATININE: 1.07 mg/dL — AB (ref 0.44–1.00)
Calcium: 7.7 mg/dL — ABNORMAL LOW (ref 8.9–10.3)
GFR calc non Af Amer: 49 mL/min — ABNORMAL LOW (ref >60)
GFR, EST AFRICAN AMERICAN: 57 mL/min — AB (ref >60)
Glucose, Bld: 186 mg/dL — ABNORMAL HIGH (ref 65–99)
Potassium: 3.6 mmol/L (ref 3.5–5.1)
Sodium: 139 mmol/L (ref 135–145)

## 2018-03-17 LAB — CBC
HEMATOCRIT: 31.3 % — AB (ref 36.0–46.0)
HEMOGLOBIN: 10 g/dL — AB (ref 12.0–15.0)
MCH: 27 pg (ref 26.0–34.0)
MCHC: 31.9 g/dL (ref 30.0–36.0)
MCV: 84.6 fL (ref 78.0–100.0)
Platelets: 201 10*3/uL (ref 150–400)
RBC: 3.7 MIL/uL — ABNORMAL LOW (ref 3.87–5.11)
RDW: 15.1 % (ref 11.5–15.5)
WBC: 8.6 10*3/uL (ref 4.0–10.5)

## 2018-03-17 LAB — TSH: TSH: 0.353 u[IU]/mL (ref 0.350–4.500)

## 2018-03-17 LAB — GLUCOSE, CAPILLARY
GLUCOSE-CAPILLARY: 135 mg/dL — AB (ref 65–99)
GLUCOSE-CAPILLARY: 166 mg/dL — AB (ref 65–99)
GLUCOSE-CAPILLARY: 217 mg/dL — AB (ref 65–99)

## 2018-03-17 LAB — CREATININE, URINE, RANDOM: Creatinine, Urine: 42.19 mg/dL

## 2018-03-17 LAB — SODIUM, URINE, RANDOM: SODIUM UR: 72 mmol/L

## 2018-03-17 MED ORDER — INSULIN ASPART 100 UNIT/ML ~~LOC~~ SOLN
0.0000 [IU] | Freq: Every day | SUBCUTANEOUS | Status: DC
  Administered 2018-03-17 (×2): 2 [IU] via SUBCUTANEOUS
  Filled 2018-03-17: qty 1

## 2018-03-17 MED ORDER — ENOXAPARIN SODIUM 40 MG/0.4ML ~~LOC~~ SOLN
40.0000 mg | SUBCUTANEOUS | Status: DC
  Filled 2018-03-17: qty 0.4

## 2018-03-17 MED ORDER — INSULIN ASPART 100 UNIT/ML ~~LOC~~ SOLN
0.0000 [IU] | Freq: Three times a day (TID) | SUBCUTANEOUS | Status: DC
  Administered 2018-03-17: 3 [IU] via SUBCUTANEOUS
  Administered 2018-03-17: 2 [IU] via SUBCUTANEOUS
  Administered 2018-03-17 – 2018-03-18 (×2): 3 [IU] via SUBCUTANEOUS
  Filled 2018-03-17: qty 1

## 2018-03-17 NOTE — Evaluation (Signed)
Physical Therapy Evaluation Patient Details Name: [name] MRN: [id number] DOB: 1941/07/08 Today's Date: 03/17/2018   History of Present Illness  Pt is a 77 y/o female admitted secondary to generalized weakness and dizziness. Per notes, pt "lowered herself to the ground" and called EMS prior to admission. Pt with recent diagnosis of bells palsy as well. PMH inlcudes HTN, DM, solitary kidney, CAD s/p stent placement, and back surgery.   Clinical Impression  Pt admitted secondary to problem above with deficits below. Pt presenting with slow processing and decreased safety awareness during mobility tasks. Running into objects on both sides with use of RW; question inattention vs. Visual deficits. Required min to mod A for mobility with RW. Pt also with recent diagnosis of bell's palsy, and exhibiting facial characteristics of bell's palsy. Feel pt is a high fall risk and unsure of physical assist able to be provided at home. Feel pt would benefit from short term SNF prior to return home to increase safety with mobility. Will continue to follow acutely to maximize functional mobility independence and safety.     Follow Up Recommendations SNF;Supervision/Assistance - 24 hour    Equipment Recommendations  Rolling walker with 5" wheels;3in1 (PT)    Recommendations for Other Services OT consult     Precautions / Restrictions Precautions Precautions: Fall Precaution Comments: Per notes, pt with multiple falls, however, per pt has not fallen within the past couple of years.  Restrictions Weight Bearing Restrictions: No      Mobility  Bed Mobility Overal bed mobility: Needs Assistance Bed Mobility: Supine to Sit;Sit to Supine     Supine to sit: Min assist Sit to supine: Mod assist   General bed mobility comments: Min A for trunk elevation and assist with scooting hips to EOB. Verbal cues for sequencing. Required mod A for LE assist for return to supine.   Transfers Overall transfer  level: Needs assistance Equipment used: Rolling walker (2 wheeled) Transfers: Sit to/from Stand Sit to Stand: Min assist;Mod assist         General transfer comment: Min A for lift assist and steadying from normal height surfaces and mod A from lower surfaces. Cues for safe hand placement.   Ambulation/Gait Ambulation/Gait assistance: Min assist Ambulation Distance (Feet): 20 Feet Assistive device: Rolling walker (2 wheeled) Gait Pattern/deviations: Step-through pattern;Decreased stride length;Trunk flexed;Drifts right/left Gait velocity: Decreased  Gait velocity interpretation: <1.31 ft/sec, indicative of household ambulator General Gait Details: Very slow, unsteady gait. Poor use of RW and pt running into obstacles on both sides. Unsure if inattention vs visual deficits, however, pt reports she is able to see without blurriness. Verbal cues for proximity to device and safe use of RW.   Stairs            Wheelchair Mobility    Modified Rankin (Stroke Patients Only)       Balance Overall balance assessment: Needs assistance Sitting-balance support: No upper extremity supported;Feet supported Sitting balance-Leahy Scale: Fair     Standing balance support: Bilateral upper extremity supported;During functional activity Standing balance-Leahy Scale: Poor Standing balance comment: Reliant on BUE support.                             Pertinent Vitals/Pain Pain Assessment: No/denies pain    Home Living Family/patient expects to be discharged to:: Private residence Living Arrangements: Spouse/significant other Available Help at Discharge: Family;Available 24 hours/day Type of Home: House Home Access: Stairs to enter  Entrance Stairs-Rails: None Entrance Stairs-Number of Steps: 4 Home Layout: One level Home Equipment: Walker - 4 wheels;Cane - single point      Prior Function Level of Independence: Needs assistance   Gait / Transfers Assistance Needed:  Reports she sometimes uses rollator or cane   ADL's / Homemaking Assistance Needed: Needs help with bathing and dressing         Hand Dominance   Dominant Hand: Right    Extremity/Trunk Assessment   Upper Extremity Assessment Upper Extremity Assessment: Defer to OT evaluation    Lower Extremity Assessment Lower Extremity Assessment: Generalized weakness    Cervical / Trunk Assessment Cervical / Trunk Assessment: Other exceptions Cervical / Trunk Exceptions: Pt with recent diagnosis of Bell's palsy.   Communication   Communication: No difficulties  Cognition Arousal/Alertness: Awake/alert Behavior During Therapy: WFL for tasks assessed/performed Overall Cognitive Status: No family/caregiver present to determine baseline cognitive functioning                                 General Comments: Pt very slow to respond to questions when asked. Pt also demonstrated slower processing during mobiltiy tasks. Reports she hasn't had falls within the past two years, however, kept talking about her falls at home throughout session.       General Comments General comments (skin integrity, edema, etc.): Asked if pt with any visual deficits, however, pt reporting none.     Exercises     Assessment/Plan    PT Assessment Patient needs continued PT services  PT Problem List Decreased strength;Decreased balance;Decreased mobility;Decreased cognition;Decreased knowledge of use of DME;Decreased safety awareness;Decreased knowledge of precautions;Pain       PT Treatment Interventions DME instruction;Gait training;Stair training;Functional mobility training;Therapeutic activities;Therapeutic exercise;Balance training;Neuromuscular re-education;Patient/family education;Cognitive remediation    PT Goals (Current goals can be found in the Care Plan section)  Acute Rehab PT Goals Patient Stated Goal: to get some sleep  PT Goal Formulation: With patient Time For Goal Achievement:  03/31/18 Potential to Achieve Goals: Good    Frequency Min 2X/week   Barriers to discharge Other (comment) Pt reports husband not able to physically assist.     Co-evaluation               AM-PAC PT "6 Clicks" Daily Activity  Outcome Measure Difficulty turning over in bed (including adjusting bedclothes, sheets and blankets)?: A Little Difficulty moving from lying on back to sitting on the side of the bed? : Unable Difficulty sitting down on and standing up from a chair with arms (e.g., wheelchair, bedside commode, etc,.)?: Unable Help needed moving to and from a bed to chair (including a wheelchair)?: A Little Help needed walking in hospital room?: A Little Help needed climbing 3-5 steps with a railing? : A Lot 6 Click Score: 13    End of Session Equipment Utilized During Treatment: Gait belt Activity Tolerance: Patient tolerated treatment well Patient left: in bed;with call bell/phone within reach;with bed alarm set;with family/visitor present Nurse Communication: Mobility status PT Visit Diagnosis: Unsteadiness on feet (R26.81);Other abnormalities of gait and mobility (R26.89);Muscle weakness (generalized) (M62.81);History of falling (Z91.81);Other symptoms and signs involving the nervous system (R29.898)    Time: [id number] PT Time Calculation (min) (ACUTE ONLY): 35 min   Charges:   PT Evaluation $PT Eval Moderate Complexity: 1 Mod PT Treatments $Therapeutic Activity: 8-22 mins   PT G Codes:        [name],  PT, DPT  Acute Rehabilitation Services  Pager: [phone]   Lehman Prom 03/17/2018, 11:28 AM

## 2018-03-17 NOTE — Care Management Note (Addendum)
Case Management Note  Patient Details  Name: [name] MRN: [id number] Date of Birth: 17-Sep-1941  Subjective/Objective: History of Bells palsy,chronic fibromyalgia, HTN, DM, solitary kidney, CAD s/p stent placement, and back surgery.  Admitted for Acute renal failure, near Syncope             Action/Plan: Prior to admission patient lived at home with spouse. Will be returning to the same living situation after discharge.  At discharge, patient has transportation home.  Patient has the ability to pay for medications and food.  Patient able to get to medical appointments.  Home DME: Walker - 4 wheels;Cane - single point;Wheelchair - Magazine features editormanual;Shower seat and rollator. Physical therapy recommended HH PT, DME; RW, 3-in-1. Occupational therapy recommended HH PT. Verified patient address and phone number correct. NCM will continue to monitor for discharge transition needs.  Expected Discharge Date:    03/18/2018          Expected Discharge Plan:   Home with home health In-House Referral:   N/A Discharge planning Services   CM consult Post Acute Care Choice:   Home Health Choice offered to:   Patient, spouse HH Arranged:    PT HH Agency[facility]   [facility]  Status of Service:     Additional Comments: [facility] to access patient for [facility] Program.  Discussed DME recommendation for 3-in-1, patient/spouse agreed, selected [facility].  [facility], arranged for delivery prior to discharge.  Yancey FlemingsKimberly R Jourdan Durbin, RN 03/17/2018, 1:51 PM

## 2018-03-17 NOTE — Evaluation (Signed)
Occupational Therapy Evaluation Patient Details Name: [name] MRN: [id number] DOB: 1941/09/19 Today's Date: 03/17/2018    History of Present Illness Pt is a 77 y/o female admitted secondary to generalized weakness and dizziness. Per notes, pt "lowered herself to the ground" and called EMS prior to admission. Pt with recent diagnosis of bells palsy as well. PMH inlcudes HTN, DM, solitary kidney, CAD s/p stent placement, and back surgery.    Clinical Impression   Pt admitted with the above diagnosis and has the deficits listed below. Pt would benefit from cont OT to increase independence with basic adls and ROM in B shoulders so she will be able to be more independent at home with bathing and dressing and decrease the burden on her caregiver.  Feel pt could do more for herself. Will continue to follow.    Follow Up Recommendations  Home health OT;Supervision/Assistance - 24 hour    Equipment Recommendations       Recommendations for Other Services       Precautions / Restrictions Precautions Precautions: Fall Precaution Comments: Per notes, pt with multiple falls, however, per pt has not fallen within the past couple of years.  Restrictions Weight Bearing Restrictions: No      Mobility Bed Mobility Overal bed mobility: Needs Assistance Bed Mobility: Supine to Sit;Sit to Supine     Supine to sit: Min assist Sit to supine: Min assist   General bed mobility comments: Pt required min assist to get into full sitting with her trunk but was able to get back in bed without physical assist but head of bed was up.  Transfers Overall transfer level: Needs assistance Equipment used: Rolling walker (2 wheeled) Transfers: Sit to/from [employer]GI CorporationStand;Stand Pivot Transfers Sit to Stand: Min guard Stand pivot transfers: Min assist       General transfer comment: Cues for hand placement.      Balance Overall balance assessment: Needs assistance Sitting-balance support: No upper  extremity supported;Feet supported Sitting balance-Leahy Scale: Good     Standing balance support: Bilateral upper extremity supported;During functional activity Standing balance-Leahy Scale: Poor Standing balance comment: Pt must have outside support to remain standing.                           ADL either performed or assessed with clinical judgement   ADL Overall ADL's : Needs assistance/impaired Eating/Feeding: Independent;Sitting   Grooming: Wash/dry hands;Wash/dry face;Oral care;Applying deodorant;Brushing hair;Minimal assistance;Standing Grooming Details (indicate cue type and reason): Pt requires assist with hair and anything overhead due to decreased ROM in B shoulders.  Otherwise WFL Upper Body Bathing: Set up;Sitting   Lower Body Bathing: Minimal assistance;Sit to/from stand;Cueing for compensatory techniques Lower Body Bathing Details (indicate cue type and reason): Pt has deficits reaching behind her to pull pants up because of pain in B shoulders. Upper Body Dressing : Minimal assistance;Sitting   Lower Body Dressing: Minimal assistance;Cueing for compensatory techniques;Sit to/from stand Lower Body Dressing Details (indicate cue type and reason): Min assist to fasten pants and manage buttons and zippers.  Pt has clothes at home that do not have these fasteners. Toilet Transfer: Minimal assistance;RW;Comfort height toilet;Grab bars Toilet Transfer Details (indicate cue type and reason): Pt walked to bathroom with min guard to occasional min assist. Toileting- Clothing Manipulation and Hygiene: Minimal assistance;Sit to/from stand;Cueing for compensatory techniques Toileting - Clothing Manipulation Details (indicate cue type and reason): Pt states she can clean self at home.  Shoulders were hurting  which makes cleaning herself more difficult. Pt may benefit from a toilet aid if she could use it.      Functional mobility during ADLs: Minimal assistance;Rolling  walker General ADL Comments: Pt mildly lethargic today slurring words a bit but was able to participate in adls requiring overall min assist with tasks in standing or tasks that require increased ROM in her shoulders.  Husband is home to assist pt.     Vision Baseline Vision/History: Wears glasses;Cataracts Wears Glasses: At all times Patient Visual Report: No change from baseline Vision Assessment?: Vision impaired- to be further tested in functional context Additional Comments: Pt with Bells palsy but basic vision appears intact.     Perception Perception Perception Tested?: Yes   Praxis Praxis Praxis tested?: Within functional limits    Pertinent Vitals/Pain Pain Assessment: No/denies pain     Hand Dominance Right   Extremity/Trunk Assessment Upper Extremity Assessment Upper Extremity Assessment: RUE deficits/detail;LUE deficits/detail RUE Deficits / Details: Shoulder PROM limited to 90 degrees and AROM limited to 70 degrees due to pain.  Otherwise WFL RUE: Unable to fully assess due to pain RUE Coordination: decreased gross motor LUE Deficits / Details: PROM to 120 degrees and active to 90 degrees. Limited by pain in shoulders.  Otherwise WFL throughout LUE. LUE: Unable to fully assess due to pain LUE Coordination: decreased gross motor   Lower Extremity Assessment Lower Extremity Assessment: Defer to PT evaluation   Cervical / Trunk Assessment Cervical / Trunk Assessment: Other exceptions Cervical / Trunk Exceptions: Pt with recent diagnosis of Bell's palsy.    Communication Communication Communication: No difficulties   Cognition Arousal/Alertness: Awake/alert Behavior During Therapy: WFL for tasks assessed/performed Overall Cognitive Status: No family/caregiver present to determine baseline cognitive functioning                                 General Comments: Pt with decreased initiation and decreased processing during eval taking extra time to  answer all questions.  Husband not present to get baseline. Pt was oriented and answered home questions accurately.   General Comments  At this point, feel pt needs someone with her at all times.  Pt is unsteady on her feet and has a lot of shoulder pain which makes many adls more difficult due to lack of ROM in shoulders.    Exercises     Shoulder Instructions      Home Living Family/patient expects to be discharged to:: Private residence Living Arrangements: Spouse/significant other Available Help at Discharge: Family;Available 24 hours/day Type of Home: House Home Access: Stairs to enter [employer] of Steps: 4 Entrance Stairs-Rails: None Home Layout: One level     Bathroom Shower/Tub: Tub/shower unit;Walk-in shower   Bathroom Toilet: Standard     Home Equipment: Environmental consultant - 4 wheels;Cane - single point;Wheelchair - manual;Shower seat   Additional Comments: Pt has entrance in back that only has 1 step and she uses this entrance most of the time.      Prior Functioning/Environment Level of Independence: Needs assistance  Gait / Transfers Assistance Needed: Reports she sometimes uses rollator or cane  ADL's / Homemaking Assistance Needed: Pt has help with bathing and dressing. Pt can toilet on her own on a high commode.  Husband does all cooking, cleaning and driving   Comments: pt stated she is essentially homebound except for MD visits        OT Problem List: Decreased  strength;Decreased range of motion;Decreased activity tolerance;Impaired balance (sitting and/or standing);Impaired vision/perception;Decreased cognition;Decreased safety awareness;Decreased knowledge of use of DME or AE;Impaired UE functional use;Pain      OT Treatment/Interventions: Self-care/ADL training;DME and/or AE instruction;Therapeutic activities    OT Goals(Current goals can be found in the care plan section) Acute Rehab OT Goals Patient Stated Goal: to get some sleep  OT Goal  Formulation: With patient Time For Goal Achievement: 03/31/18 Potential to Achieve Goals: Fair ADL Goals Pt Will Perform Grooming: with supervision;standing Pt Will Perform Lower Body Bathing: with supervision;sit to/from stand Pt Will Perform Upper Body Dressing: with set-up;sitting Pt Will Perform Lower Body Dressing: with supervision;sit to/from stand Additional ADL Goal #1: Pt will walk to bathroom with assistive device and complete all toileting with S.  OT Frequency: Min 2X/week   Barriers to D/C:    husband home most of the time.       Co-evaluation              AM-PAC PT "6 Clicks" Daily Activity     Outcome Measure Help from another person eating meals?: None Help from another person taking care of personal grooming?: A Little Help from another person toileting, which includes using toliet, bedpan, or urinal?: A Little Help from another person bathing (including washing, rinsing, drying)?: A Little Help from another person to put on and taking off regular upper body clothing?: A Little Help from another person to put on and taking off regular lower body clothing?: A Little 6 Click Score: 19   End of Session Equipment Utilized During Treatment: Rolling walker Nurse Communication: Mobility status;Other (comment)(Pt wanted juice.  Tech in to check blood sugar.)  Activity Tolerance: Patient tolerated treatment well Patient left: in bed;with call bell/phone within reach;with bed alarm set  OT Visit Diagnosis: Unsteadiness on feet (R26.81);Repeated falls (R29.6);Muscle weakness (generalized) (M62.81);Other symptoms and signs involving the nervous system (R29.898);Other symptoms and signs involving cognitive function;Pain Pain - Right/Left: Right Pain - part of body: Shoulder                Time: 1130-1157 OT Time Calculation (min): 27 min Charges:  OT General Charges $OT Visit: 1 Visit OT Evaluation $OT Eval Moderate Complexity: 1 Mod OT Treatments $Self Care/Home  Management : 8-22 mins G-Codes:     Tory Emerald, OTR/L [phone]  Hope Budds 03/17/2018, 12:13 PM

## 2018-03-17 NOTE — Progress Notes (Signed)
PROGRESS NOTE    [name]  ONG:[id number] DOB: 06-19-1941 DOA: 03/16/2018 PCP: Sebastian Ache, PA-C   Brief Narrative:77 y.o. female with medical history significant of HTN, chronic pain, Epstein-Barr virus infection, Bell's palsy, and solitary kidney since birth; who presents with complaints of lightheadedness.  History is noted to be difficult to obtain from the patient.  Patient makes it seem that symptoms have been going on for months.  Today she reports sliding down to the floor due to her feeling as though she was going to pass out, but denies any loss of consciousness.  Denies any trauma to her head, chest pain, shortness of breath, vomiting, diarrhea, or constipation symptoms.  She complains of generalized malaise, generalized weakness, poor appetite, and that she is unable to do anything for herself at home.  She lives with her husband, but notes that he has memory issues, and does the best that he can to help take care of her.  Patient was diagnosed with Bell's palsy last month after being seen in the emergency department.  ED Course: Upon admission into the emergency department patient was seen to be afebrile, with vital signs relatively within normal limits.  Labs revealed WBC 11.9, hemoglobin 12 BUN 66, creatinine 1.78, and glucose 250.     Assessment & Plan:   Principal Problem:   ARF (acute renal failure) (HCC) Active Problems:   Essential hypertension   Type 2 diabetes mellitus without complication, with long-term current use of insulin (HCC)   Dehydration   Generalized weakness   Near syncope  1] acute kidney injury secondary to decreased p.o. intake.  Her clinical functions have already improved with IV hydration.  Her creatinine on admission was 1.78 which is come down to 1.07.  Her renal ultrasound shows no left hydronephrosis and absent right kidney.  She has a solitary kidney since birth.  2] history of recent Bell's palsy  3] history of  hypertension  4] history of chronic fibromyalgia-patient does have a history of excessive daytime sleepiness for which a sleep study was ordered as an outpatient in 2016 which she refused to do it.  She was prescribed Adderall at that time to keep her awake Apparently PCP is not prescribing any more Adderall.  DVT prophylaxislovenox Code Status: full Family Communication: none Disposition Plan:  Possible snf  Consultants:  none Proceduresnone Antimicrobials none Subjective:denies any new complaints..just feel weak all over.  Objective: Vitals:   03/17/18 0600 03/17/18 0630 03/17/18 0800 03/17/18 0950  BP: (!) 118/45 (!) 111/52 (!) 117/43 (!) 138/55  Pulse: 73 73 73 72  Resp: 18 18  18   Temp:    97.7 F (36.5 C)  TempSrc:    Oral  SpO2: 96% 95% 96% 99%  Weight:      Height:        Intake/Output Summary (Last 24 hours) at 03/17/2018 1256 Last data filed at 03/16/2018 2347 Gross per 24 hour  Intake 3548.42 ml  Output -  Net 3548.42 ml   Filed Weights   03/16/18 2300  Weight: 80.3 kg (177 lb 0.5 oz)    Examination:  General exam: Appears calm and comfortable  Respiratory system: Clear to auscultation. Respiratory effort normal. Cardiovascular system: S1 & S2 heard, RRR. No JVD, murmurs, rubs, gallops or clicks. No pedal edema. Gastrointestinal system: Abdomen is nondistended, soft and nontender. No organomegaly or masses felt. Normal bowel sounds heard. Central nervous system: Alert and oriented. No focal neurological deficits. Extremities: Symmetric 5 x 5  power. Skin: No rashes, lesions or ulcers Psychiatry: Judgement and insight appear normal. Mood & affect appropriate.     Data Reviewed: I have personally reviewed following labs and imaging studies  CBC: Recent Labs  Lab 03/16/18 2059 03/17/18 0540  WBC 11.9* 8.6  HGB 12.0 10.0*  HCT 37.5 31.3*  MCV 83.5 84.6  PLT 259 201   Basic Metabolic Panel: Recent Labs  Lab 03/16/18 2059 03/17/18 0540  NA  135 139  K 4.3 3.6  CL 101 112*  CO2 22 20*  GLUCOSE 250* 186*  BUN 66* 46*  CREATININE 1.78* 1.07*  CALCIUM 8.9 7.7*   GFR: Estimated Creatinine Clearance: 44.2 mL/min (A) (by C-G formula based on SCr of 1.07 mg/dL (H)). Liver Function Tests: No results for input(s): AST, ALT, ALKPHOS, BILITOT, PROT, ALBUMIN in the last 168 hours. No results for input(s): LIPASE, AMYLASE in the last 168 hours. No results for input(s): AMMONIA in the last 168 hours. Coagulation Profile: No results for input(s): INR, PROTIME in the last 168 hours. Cardiac Enzymes: No results for input(s): CKTOTAL, CKMB, CKMBINDEX, TROPONINI in the last 168 hours. BNP (last 3 results) No results for input(s): PROBNP in the last 8760 hours. HbA1C: No results for input(s): HGBA1C in the last 72 hours. CBG: Recent Labs  Lab 03/16/18 2136 03/17/18 0114 03/17/18 0755 03/17/18 1156  GLUCAP 246* 213* 154* 135*   Lipid Profile: No results for input(s): CHOL, HDL, LDLCALC, TRIG, CHOLHDL, LDLDIRECT in the last 72 hours. Thyroid Function Tests: Recent Labs    03/17/18 0015  TSH 0.353   Anemia Panel: No results for input(s): VITAMINB12, FOLATE, FERRITIN, TIBC, IRON, RETICCTPCT in the last 72 hours. Sepsis Labs: No results for input(s): PROCALCITON, LATICACIDVEN in the last 168 hours.  No results found for this or any previous visit (from the past 240 hour(s)).       Radiology Studies: [place]  Result Date: 03/17/2018 CLINICAL DATA:  Acute renal failure. EXAM: RENAL / URINARY TRACT ULTRASOUND COMPLETE COMPARISON:  None. FINDINGS: Right Kidney: The right kidney is absent. Left Kidney: Length: 12.8 cm. Echogenicity within normal limits. No mass or hydronephrosis visualized. Bladder: Appears normal for degree of bladder distention. IMPRESSION: 1. Absent right kidney. 2. No left hydronephrosis or other abnormality. Electronically Signed   By: Deatra RobinsonKevin  Herman M.D.   On: 03/17/2018 00:48        Scheduled  Meds: . aspirin  81 mg Oral Daily  . enoxaparin (LOVENOX) injection  30 mg Subcutaneous Q24H  . insulin aspart  0-15 Units Subcutaneous TID WC  . insulin aspart  0-5 Units Subcutaneous QHS  . insulin glargine  24 Units Subcutaneous Q2200  . levothyroxine  50 mcg Oral QAC breakfast  . venlafaxine  100 mg Oral Daily   Continuous Infusions: . sodium chloride 125 mL/hr at 03/17/18 0300     LOS: 1 day       Alwyn RenElizabeth G Bernie Ransford, MD [facility] If 7PM-7AM, please contact night-coverage [email or web address] Password [email or web address] 03/17/2018, 12:56 PM

## 2018-03-18 ENCOUNTER — Other Ambulatory Visit: Payer: Self-pay

## 2018-03-18 LAB — BASIC METABOLIC PANEL
Anion gap: 7 (ref 5–15)
BUN: 24 mg/dL — AB (ref 6–20)
CHLORIDE: 109 mmol/L (ref 101–111)
CO2: 24 mmol/L (ref 22–32)
Calcium: 8.7 mg/dL — ABNORMAL LOW (ref 8.9–10.3)
Creatinine, Ser: 0.81 mg/dL (ref 0.44–1.00)
Glucose, Bld: 108 mg/dL — ABNORMAL HIGH (ref 65–99)
POTASSIUM: 4.9 mmol/L (ref 3.5–5.1)
SODIUM: 140 mmol/L (ref 135–145)

## 2018-03-18 LAB — GLUCOSE, CAPILLARY
GLUCOSE-CAPILLARY: 107 mg/dL — AB (ref 65–99)
GLUCOSE-CAPILLARY: 196 mg/dL — AB (ref 65–99)

## 2018-03-18 MED ORDER — ACETAMINOPHEN 325 MG PO TABS: 650.0000 mg | ORAL_TABLET | Freq: Four times a day (QID) | ORAL | Status: DC | PRN

## 2018-03-18 NOTE — Discharge Summary (Signed)
Physician Discharge Summary  [name] TOI:[id number] DOB: [birth year] DOA: 03/16/2018  PCP: Dorise Hiss, PA-C  Admit date: 03/16/2018 Discharge date: 03/18/2018  Admitted From: home Disposition:  home  Recommendations for Outpatient Follow-up:  1. Follow up with PCP in 1-2 weeks 2. Please obtain BMP/CBC in one week Home Health:yes Equipment/Devices rw Discharge Condition:stable CODE STATUS:full Diet recommendationcarb modified Brief/Interim Summary:77 y.o.femalewith medical history significant ofHTN, chronic pain, Epstein-Barr virus infection,Bell's palsy, and solitary kidney since birth;who presents with complaints of lightheadedness. History is noted to be difficult to obtain from the patient. Patient makes it seem that symptoms have been going on for months. Today she reports sliding down to the floor due to her feeling as though she was going to pass out, but denies any loss of consciousness. Denies any trauma to her head, chest pain, shortness of breath, vomiting, diarrhea, or constipation symptoms. She complains of generalized malaise, generalized weakness, poor appetite, and that she is unable to do anything for herself at home. She lives with her husband,but notes that he has memory issues, and does the best that he can to help take care of her. Patient was diagnosed with Bell's palsy last month after being seen in the emergency department.  ED Course:Upon admission into the emergency department patient was seen to be afebrile, with vital signs relatively within normal limits. Labs revealed WBC 11.9, hemoglobin 12 BUN 66, creatinine 1.78,andglucose 250.     Discharge Diagnoses:  Principal Problem:   ARF (acute renal failure) (HCC) Active Problems:   Essential hypertension   Type 2 diabetes mellitus without complication, with long-term current use of insulin (HCC)   Dehydration   Generalized weakness   Near syncope 1] acute kidney injury  secondary to decreased p.o. intake.  She was also taking Advil at home for pain.  This will be discontinued at the time of discharge.  In addition to that he was she was also taking metformin along with ACE inhibitor and hydrochlorothiazide.  I will restart the ACE inhibitor and metformin I will hold on restarting the hydrochlorothiazide patient should follow-up with her PCP and decide whether HCTZ should be restarted in the future. Her creatinine on admission was 1.78 and has come down to 0.81. Her renal ultrasound shows no left hydronephrosis and absent right kidney.  She has a solitary kidney since birth.  2] history of recent Bell's palsy  3] history of hypertension continue lisinopril.ive not restrted hctz due to recent dehydration.  4] history of chronic fibromyalgia-continue  Adderall.   Discharge Instructions  Discharge Instructions    Diet - low sodium heart healthy   Complete by:  As directed    For home use only DME 4 wheeled rolling walker with seat   Complete by:  As directed    Patient needs a walker to treat with the following condition:  Unsteady gait   Increase activity slowly   Complete by:  As directed      Allergies as of 03/18/2018      Reactions   Azithromycin Nausea And Vomiting   Cardizem [diltiazem Hcl] Nausea And Vomiting   Codeine Other (See Comments)   Feeling weird   Coreg [carvedilol] Nausea And Vomiting   Demerol [meperidine] Other (See Comments)   Per pt disoriented   Erythromycin Nausea And Vomiting   Very sick   Inderal [propranolol] Other (See Comments)   Made me cry   Procardia [nifedipine] Other (See Comments)   Disoriented, sick   Wellbutrin [bupropion] Other (  See Comments)   Unknown reaction   Zoloft [sertraline Hcl] Other (See Comments)   Could not talk, stiff      Medication List    STOP taking these medications   DELSYM 30 MG/5ML liquid Generic drug:  dextromethorphan   diphenhydrAMINE 25 MG tablet Commonly known as:   BENADRYL   hydrochlorothiazide 12.5 MG capsule Commonly known as:  MICROZIDE   hydrochlorothiazide 25 MG tablet Commonly known as:  HYDRODIURIL   HYDROCORTISONE (TOPICAL) 2 % Lotn   ibuprofen 200 MG tablet Commonly known as:  ADVIL,MOTRIN   lidocaine 5 % Commonly known as:  LIDODERM   potassium chloride 10 MEQ tablet Commonly known as:  K-DUR,KLOR-CON     TAKE these medications   ACCU-CHEK SOFTCLIX LANCETS lancets USE TO TEST FOUR TIMES DAILY   acetaminophen 325 MG tablet Commonly known as:  TYLENOL Take 2 tablets (650 mg total) by mouth every 6 (six) hours as needed for mild pain (or Fever >/= 101).   Alfalfa 250 MG Tabs Take 1 tablet by mouth every morning.   amphetamine-dextroamphetamine 20 MG tablet Commonly known as:  ADDERALL Take 1 tablet (20 mg total) by mouth 2 (two) times daily as needed. What changed:  reasons to take this   aspirin 81 MG tablet Take 81 mg by mouth daily.   atenolol 25 MG tablet Commonly known as:  TENORMIN TAKE 1 TABLET BY MOUTH IN THE MORNING AND 2 IN THE EVENING   blood glucose meter kit and supplies Dispense based on patient and insurance preference. Use up to four times daily as directed. (FOR ICD-9 250.00, 250.01).   CHROMIUM ASPARTATE PO Take 1 tablet by mouth every morning.   Cinnamon Bark Powd Take 1 Dose by mouth daily.   diazepam 5 MG tablet Commonly known as:  VALIUM Take 5 mg by mouth every 12 (twelve) hours as needed for anxiety.   diclofenac sodium 1 % Gel Commonly known as:  VOLTAREN Apply 4 g topically 4 (four) times daily.   fexofenadine 180 MG tablet Commonly known as:  ALLEGRA Take 180 mg by mouth daily as needed for allergies.   glucose 4 GM chewable tablet Chew 1 tablet by mouth as needed for low blood sugar.   glucose blood test strip Commonly known as:  ACCU-CHEK AVIVA PLUS USE TO TEST FOUR TIMES DAILY   GNP COQ-10 & FISH OIL 120-180-50-30 Caps Generic drug:  DHA-EPA-Coenzyme Q10-Vitamin  E Take 3 tablets by mouth daily.   HUGO ROLLING WALKER ELITE Misc 1 Units by Does not apply route daily as needed.   HYDROcodone-acetaminophen 10-325 MG tablet Commonly known as:  NORCO Take 1-2 tablets by mouth every 4 (four) hours as needed for moderate pain.   Insulin Glargine 100 UNIT/ML Solostar Pen Commonly known as:  LANTUS SOLOSTAR Inject 24 Units into the skin daily at 10 pm.   INSULIN SYRINGE .5CC/31GX5/16" 31G X 5/16" 0.5 ML Misc Use as directed   levothyroxine 50 MCG tablet Commonly known as:  SYNTHROID, LEVOTHROID Take 1 tablet (50 mcg total) by mouth daily before breakfast.   lisdexamfetamine 30 MG capsule Commonly known as:  VYVANSE Take 1 capsule (30 mg total) by mouth daily.   lisinopril 20 MG tablet Commonly known as:  PRINIVIL,ZESTRIL TAKE 1 TABLET BY MOUTH IN THE MORNING AND 1/2 (ONE-HALF) AT BEDTIME   magnesium oxide 400 MG tablet Commonly known as:  MAG-OX Take 400 mg by mouth 2 (two) times daily.   metFORMIN 500 MG tablet Commonly known  as:  GLUCOPHAGE Take 1 tablet (500 mg total) by mouth 2 (two) times daily with a meal. Wk1: 1/2 tab 2x/day; Wk2: 1 tab am, 1/2 tab pm; Wk3: 2 tabs 2x/day.   methocarbamol 500 MG tablet Commonly known as:  ROBAXIN Take 1 tablet (500 mg total) by mouth 2 (two) times daily as needed for muscle spasms.   triamcinolone 0.025 % ointment Commonly known as:  KENALOG Apply 1 application topically 2 (two) times daily. What changed:    when to take this  reasons to take this   venlafaxine 100 MG tablet Commonly known as:  EFFEXOR Take 1 tablet (100 mg total) by mouth daily.   Vitamin D3 5000 units Tbdp Take 1 capsule by mouth daily.            Durable Medical Equipment  (From admission, onward)        Start     Ordered   03/18/18 0000  For home use only DME 4 wheeled rolling walker with seat    Question:  Patient needs a walker to treat with the following condition  Answer:  Unsteady gait   03/18/18  1039     Follow-up Information    McVey, Gelene Mink, PA-C Follow up.   Specialties:  Librarian, academic, Family Medicine Contact information: [place] [address] [phone]          Allergies  Allergen Reactions  . Azithromycin Nausea And Vomiting  . Cardizem [Diltiazem Hcl] Nausea And Vomiting  . Codeine Other (See Comments)    Feeling weird   . Coreg [Carvedilol] Nausea And Vomiting  . Demerol [Meperidine] Other (See Comments)    Per pt disoriented  . Erythromycin Nausea And Vomiting    Very sick  . Inderal [Propranolol] Other (See Comments)    Made me cry  . Procardia [Nifedipine] Other (See Comments)    Disoriented, sick  . Wellbutrin [Bupropion] Other (See Comments)    Unknown reaction  . Zoloft [Sertraline Hcl] Other (See Comments)    Could not talk, stiff    Consultations: none Procedures/Studies: US Renal  Result Date: 03/17/2018 CLINICAL DATA:  Acute renal failure. EXAM: RENAL / URINARY TRACT ULTRASOUND COMPLETE COMPARISON:  None. FINDINGS: Right Kidney: The right kidney is absent. Left Kidney: Length: 12.8 cm. Echogenicity within normal limits. No mass or hydronephrosis visualized. Bladder: Appears normal for degree of bladder distention. IMPRESSION: 1. Absent right kidney. 2. No left hydronephrosis or other abnormality. Electronically Signed   By: Ulyses Jarred M.D.   On: 03/17/2018 00:48    (Echo, Carotid, EGD, Colonoscopy, ERCP)    Subjective:   Discharge Exam: Vitals:   03/18/18 0926 03/18/18 0949  BP: (!) 79/64 (!) 150/80  Pulse: 89   Resp: 18   Temp: 97.7 F (36.5 C)   SpO2: 99%    Vitals:   03/17/18 2036 03/18/18 0515 03/18/18 0926 03/18/18 0949  BP:  102/88 (!) 79/64 (!) 150/80  Pulse:  95 89   Resp:   18   Temp:  98.6 F (37 C) 97.7 F (36.5 C)   TempSrc:  Oral Oral   SpO2:  96% 99%   Weight: 80.9 kg (178 lb 5.6 oz)     Height:        General: Pt is alert, awake, not in acute  distress Cardiovascular: RRR, S1/S2 +, no rubs, no gallops Respiratory: CTA bilaterally, no wheezing, no rhonchi Abdominal: Soft, NT, ND, bowel sounds + Extremities: no edema, no cyanosis  The results of significant diagnostics from this hospitalization (including imaging, microbiology, ancillary and laboratory) are listed below for reference.     Microbiology: No results found for this or any previous visit (from the past 240 hour(s)).   Labs: BNP (last 3 results) No results for input(s): BNP in the last 8760 hours. Basic Metabolic Panel: Recent Labs  Lab 03/16/18 2059 03/17/18 0540 03/18/18 0535  NA 135 139 140  K 4.3 3.6 4.9  CL 101 112* 109  CO2 22 20* 24  GLUCOSE 250* 186* 108*  BUN 66* 46* 24*  CREATININE 1.78* 1.07* 0.81  CALCIUM 8.9 7.7* 8.7*   Liver Function Tests: No results for input(s): AST, ALT, ALKPHOS, BILITOT, PROT, ALBUMIN in the last 168 hours. No results for input(s): LIPASE, AMYLASE in the last 168 hours. No results for input(s): AMMONIA in the last 168 hours. CBC: Recent Labs  Lab 03/16/18 2059 03/17/18 0540  WBC 11.9* 8.6  HGB 12.0 10.0*  HCT 37.5 31.3*  MCV 83.5 84.6  PLT 259 201   Cardiac Enzymes: No results for input(s): CKTOTAL, CKMB, CKMBINDEX, TROPONINI in the last 168 hours. BNP: Invalid input(s): POCBNP CBG: Recent Labs  Lab 03/17/18 0755 03/17/18 1156 03/17/18 1651 03/17/18 2112 03/18/18 0738  GLUCAP 154* 135* 166* 217* 107*   D-Dimer No results for input(s): DDIMER in the last 72 hours. Hgb A1c No results for input(s): HGBA1C in the last 72 hours. Lipid Profile No results for input(s): CHOL, HDL, LDLCALC, TRIG, CHOLHDL, LDLDIRECT in the last 72 hours. Thyroid function studies Recent Labs    03/17/18 0015  TSH 0.353   Anemia work up No results for input(s): VITAMINB12, FOLATE, FERRITIN, TIBC, IRON, RETICCTPCT in the last 72 hours. Urinalysis    Component Value Date/Time   COLORURINE STRAW (A) 03/16/2018  2058   APPEARANCEUR CLEAR 03/16/2018 2058   LABSPEC 1.011 03/16/2018 2058   PHURINE 5.0 03/16/2018 2058   GLUCOSEU NEGATIVE 03/16/2018 2058   HGBUR NEGATIVE 03/16/2018 2058   BILIRUBINUR NEGATIVE 03/16/2018 2058   KETONESUR NEGATIVE 03/16/2018 2058   PROTEINUR NEGATIVE 03/16/2018 2058   UROBILINOGEN 0.2 07/15/2013 1819   NITRITE NEGATIVE 03/16/2018 2058   LEUKOCYTESUR SMALL (A) 03/16/2018 2058   Sepsis Labs Invalid input(s): PROCALCITONIN,  WBC,  LACTICIDVEN Microbiology No results found for this or any previous visit (from the past 240 hour(s)).   Time coordinating discharge: Over 33 minutes  SIGNED:   Georgette Shell, MD  [facility] 03/18/2018, 10:41 AM Pager   If 7PM-7AM, please contact night-coverage [email or web address] Password [email or web address]

## 2018-03-18 NOTE — Progress Notes (Signed)
[name] to be D/C'd Home per MD order.  Discussed prescriptions and follow up appointments with the patient. Prescriptions given to patient, medication list explained in detail. Pt verbalized understanding.  Allergies as of 03/18/2018      Reactions   Azithromycin Nausea And Vomiting   Cardizem [diltiazem Hcl] Nausea And Vomiting   Codeine Other (See Comments)   Feeling weird   Coreg [carvedilol] Nausea And Vomiting   Demerol [meperidine] Other (See Comments)   Per pt disoriented   Erythromycin Nausea And Vomiting   Very sick   Inderal [propranolol] Other (See Comments)   Made me cry   Procardia [nifedipine] Other (See Comments)   Disoriented, sick   Wellbutrin [bupropion] Other (See Comments)   Unknown reaction   Zoloft [sertraline Hcl] Other (See Comments)   Could not talk, stiff      Medication List    STOP taking these medications   DELSYM 30 MG/5ML liquid Generic drug:  dextromethorphan   diphenhydrAMINE 25 MG tablet Commonly known as:  BENADRYL   hydrochlorothiazide 12.5 MG capsule Commonly known as:  MICROZIDE   hydrochlorothiazide 25 MG tablet Commonly known as:  HYDRODIURIL   HYDROCORTISONE (TOPICAL) 2 % Lotn   ibuprofen 200 MG tablet Commonly known as:  ADVIL,MOTRIN   lidocaine 5 % Commonly known as:  LIDODERM   potassium chloride 10 MEQ tablet Commonly known as:  K-DUR,KLOR-CON     TAKE these medications   ACCU-CHEK SOFTCLIX LANCETS lancets USE TO TEST FOUR TIMES DAILY   acetaminophen 325 MG tablet Commonly known as:  TYLENOL Take 2 tablets (650 mg total) by mouth every 6 (six) hours as needed for mild pain (or Fever >/= 101).   Alfalfa 250 MG Tabs Take 1 tablet by mouth every morning.   amphetamine-dextroamphetamine 20 MG tablet Commonly known as:  ADDERALL Take 1 tablet (20 mg total) by mouth 2 (two) times daily as needed. What changed:  reasons to take this   aspirin 81 MG tablet Take 81 mg by mouth daily.   atenolol 25 MG  tablet Commonly known as:  TENORMIN TAKE 1 TABLET BY MOUTH IN THE MORNING AND 2 IN THE EVENING   blood glucose meter kit and supplies Dispense based on patient and insurance preference. Use up to four times daily as directed. (FOR ICD-9 250.00, 250.01).   CHROMIUM ASPARTATE PO Take 1 tablet by mouth every morning.   Cinnamon Bark Powd Take 1 Dose by mouth daily.   diazepam 5 MG tablet Commonly known as:  VALIUM Take 5 mg by mouth every 12 (twelve) hours as needed for anxiety.   diclofenac sodium 1 % Gel Commonly known as:  VOLTAREN Apply 4 g topically 4 (four) times daily.   fexofenadine 180 MG tablet Commonly known as:  ALLEGRA Take 180 mg by mouth daily as needed for allergies.   glucose 4 GM chewable tablet Chew 1 tablet by mouth as needed for low blood sugar.   glucose blood test strip Commonly known as:  ACCU-CHEK AVIVA PLUS USE TO TEST FOUR TIMES DAILY   GNP COQ-10 & FISH OIL 120-180-50-30 Caps Generic drug:  DHA-EPA-Coenzyme Q10-Vitamin E Take 3 tablets by mouth daily.   HUGO ROLLING WALKER ELITE Misc 1 Units by Does not apply route daily as needed.   HYDROcodone-acetaminophen 10-325 MG tablet Commonly known as:  NORCO Take 1-2 tablets by mouth every 4 (four) hours as needed for moderate pain.   Insulin Glargine 100 UNIT/ML Solostar Pen Commonly known as:  LANTUS SOLOSTAR Inject 24 Units into the skin daily at 10 pm.   INSULIN SYRINGE .5CC/31GX5/16" 31G X 5/16" 0.5 ML Misc Use as directed   levothyroxine 50 MCG tablet Commonly known as:  SYNTHROID, LEVOTHROID Take 1 tablet (50 mcg total) by mouth daily before breakfast.   lisdexamfetamine 30 MG capsule Commonly known as:  VYVANSE Take 1 capsule (30 mg total) by mouth daily.   lisinopril 20 MG tablet Commonly known as:  PRINIVIL,ZESTRIL TAKE 1 TABLET BY MOUTH IN THE MORNING AND 1/2 (ONE-HALF) AT BEDTIME   magnesium oxide 400 MG tablet Commonly known as:  MAG-OX Take 400 mg by mouth 2 (two) times  daily.   metFORMIN 500 MG tablet Commonly known as:  GLUCOPHAGE Take 1 tablet (500 mg total) by mouth 2 (two) times daily with a meal. Wk1: 1/2 tab 2x/day; Wk2: 1 tab am, 1/2 tab pm; Wk3: 2 tabs 2x/day.   methocarbamol 500 MG tablet Commonly known as:  ROBAXIN Take 1 tablet (500 mg total) by mouth 2 (two) times daily as needed for muscle spasms.   triamcinolone 0.025 % ointment Commonly known as:  KENALOG Apply 1 application topically 2 (two) times daily. What changed:    when to take this  reasons to take this   venlafaxine 100 MG tablet Commonly known as:  EFFEXOR Take 1 tablet (100 mg total) by mouth daily.   Vitamin D3 5000 units Tbdp Take 1 capsule by mouth daily.            Durable Medical Equipment  (From admission, onward)        Start     Ordered   03/18/18 1125  For home use only DME 3 n 1  Once     03/18/18 1124   03/18/18 0000  For home use only DME 4 wheeled rolling walker with seat    Question:  Patient needs a walker to treat with the following condition  Answer:  Unsteady gait   03/18/18 1039      Vitals:   03/18/18 0926 03/18/18 0949  BP: (!) 79/64 (!) 150/80  Pulse: 89   Resp: 18   Temp: 97.7 F (36.5 C)   SpO2: 99%     Skin clean, dry and intact without evidence of skin break down, no evidence of skin tears noted. IV catheter discontinued intact. Site without signs and symptoms of complications. Dressing and pressure applied. Pt denies pain at this time. No complaints noted.  An After Visit Summary was printed and given to the patient. Patient escorted via [place], and D/C home via private auto.  Dixie Dials RN, BSN

## 2018-03-24 ENCOUNTER — Inpatient Hospital Stay: Payer: Medicare Other | Admitting: Physician Assistant

## 2018-03-25 ENCOUNTER — Other Ambulatory Visit: Payer: Self-pay

## 2018-03-25 ENCOUNTER — Ambulatory Visit (INDEPENDENT_AMBULATORY_CARE_PROVIDER_SITE_OTHER): Payer: Medicare Other | Admitting: Physician Assistant

## 2018-03-25 ENCOUNTER — Encounter: Payer: Self-pay | Admitting: Physician Assistant

## 2018-03-25 VITALS — BP 126/70 | HR 63 | Temp 98.3°F | Resp 16 | Ht 63.0 in | Wt 177.8 lb

## 2018-03-25 DIAGNOSIS — Z794 Long term (current) use of insulin: Secondary | ICD-10-CM | POA: Diagnosis not present

## 2018-03-25 DIAGNOSIS — Z130 Encounter for screening for diseases of the blood and blood-forming organs and certain disorders involving the immune mechanism: Secondary | ICD-10-CM | POA: Diagnosis not present

## 2018-03-25 DIAGNOSIS — R5382 Chronic fatigue, unspecified: Secondary | ICD-10-CM

## 2018-03-25 DIAGNOSIS — E118 Type 2 diabetes mellitus with unspecified complications: Secondary | ICD-10-CM

## 2018-03-25 DIAGNOSIS — Z09 Encounter for follow-up examination after completed treatment for conditions other than malignant neoplasm: Secondary | ICD-10-CM

## 2018-03-25 NOTE — Progress Notes (Signed)
[name]  MRN: [id number] DOB: 01/23/1941  PCP: Dorise Hiss, PA-C  Subjective:  Pt is a 77 year old female who presents to clinic for hospital f/u acute kidney injury and Bells Palsy.   4/22 - ED admission for AKI 2/2 decreased p.o. Intake. Discharged home with instructions for low sodium diet.    She had home health come help, but this is finished. She is concerned about the care she receives from her husband and requests more help at home.   Anxiety - Effexor and Valium   Review of Systems  Psychiatric/Behavioral: The patient is nervous/anxious.    Patient Active Problem List   Diagnosis Date Noted  . Dehydration 03/17/2018  . Generalized weakness 03/17/2018  . Near syncope 03/17/2018  . ARF (acute renal failure) ([place]) 03/16/2018  . Hyperglycemia 09/25/2017  . Type 2 diabetes mellitus without complication, with long-term current use of insulin ([place]) 04/24/2017  . ADD (attention deficit disorder) 12/30/2016  . Narcotic addiction ([place]) 12/05/2014  . Excessive daytime sleepiness 12/05/2014  . Alteration in psychomotor activity 12/05/2014  . Depression with somatization 12/05/2014  . Hypersomnia, persistent 09/10/2013  . Obesity, unspecified 09/10/2013  . Chronic pain syndrome 09/10/2013  . Depression 07/31/2013  . Medication intolerance 07/19/2013  . Anxiety 07/19/2013  . CAD (coronary artery disease) status post stent to distal right coronary artery 2006 06/20/2013  . Neurocardiogenic syncope 06/20/2013  . Essential hypertension 06/20/2013  . Hyperlipidemia 06/20/2013    Current Outpatient Medications on File Prior to Visit  Medication Sig Dispense Refill  . ACCU-CHEK SOFTCLIX LANCETS lancets USE TO TEST FOUR TIMES DAILY 100 each 0  . Alfalfa 250 MG TABS Take 1 tablet by mouth every morning.    Marland Kitchen amphetamine-dextroamphetamine (ADDERALL) 20 MG tablet Take 1 tablet (20 mg total) by mouth 2 (two) times daily as needed. (Patient taking differently:  Take 20 mg by mouth 2 (two) times daily as needed (chronic fatigue syndrome). ) 60 tablet 0  . aspirin 81 MG tablet Take 81 mg by mouth daily.    Marland Kitchen atenolol (TENORMIN) 25 MG tablet TAKE 1 TABLET BY MOUTH IN THE MORNING AND 2 IN THE EVENING 270 tablet 0  . blood glucose meter kit and supplies Dispense based on patient and insurance preference. Use up to four times daily as directed. (FOR ICD-9 250.00, 250.01). 1 each 0  . Cholecalciferol (VITAMIN D3) 5000 units TBDP Take 1 capsule by mouth daily.    . CHROMIUM ASPARTATE PO Take 1 tablet by mouth every morning.    Verneita Griffes Bark POWD Take 1 Dose by mouth daily.    . DHA-EPA-Coenzyme Q10-Vitamin E (GNP COQ-10 & FISH OIL) 120-180-50-30 CAPS Take 3 tablets by mouth daily.     . diazepam (VALIUM) 5 MG tablet Take 5 mg by mouth every 12 (twelve) hours as needed for anxiety.     . diclofenac sodium (VOLTAREN) 1 % GEL Apply 4 g topically 4 (four) times daily. 100 g 0  . fexofenadine (ALLEGRA) 180 MG tablet Take 180 mg by mouth daily as needed for allergies.     Marland Kitchen glucose 4 GM chewable tablet Chew 1 tablet by mouth as needed for low blood sugar.    Marland Kitchen glucose blood (ACCU-CHEK AVIVA PLUS) test strip USE TO TEST FOUR TIMES DAILY 100 each 0  . hydrochlorothiazide (HYDRODIURIL) 25 MG tablet Take 25 mg by mouth daily.    Marland Kitchen HYDROcodone-acetaminophen (NORCO) 10-325 MG per tablet Take 1-2 tablets by mouth every  4 (four) hours as needed for moderate pain.     . Insulin Glargine (LANTUS SOLOSTAR) 100 UNIT/ML Solostar Pen Inject 24 Units into the skin daily at 10 pm. 5 pen PRN  . Insulin Syringe-Needle U-100 (INSULIN SYRINGE .5CC/31GX5/16") 31G X 5/16" 0.5 ML MISC Use as directed 100 each 0  . levothyroxine (SYNTHROID, LEVOTHROID) 50 MCG tablet Take 1 tablet (50 mcg total) by mouth daily before breakfast. 30 tablet 5  . lisdexamfetamine (VYVANSE) 30 MG capsule Take 1 capsule (30 mg total) by mouth daily. 30 capsule 0  . lisinopril (PRINIVIL,ZESTRIL) 20 MG tablet TAKE  1 TABLET BY MOUTH IN THE MORNING AND 1/2 (ONE-HALF) AT BEDTIME 135 tablet 0  . magnesium oxide (MAG-OX) 400 MG tablet Take 400 mg by mouth 2 (two) times daily.    . metFORMIN (GLUCOPHAGE) 500 MG tablet Take 1 tablet (500 mg total) by mouth 2 (two) times daily with a meal. Wk1: 1/2 tab 2x/day; Wk2: 1 tab am, 1/2 tab pm; Wk3: 2 tabs 2x/day. 180 tablet 3  . Misc. Devices (HUGO ROLLING WALKER ELITE) MISC 1 Units by Does not apply route daily as needed. 1 each 0  . venlafaxine (EFFEXOR) 100 MG tablet Take 1 tablet (100 mg total) by mouth daily. 30 tablet 2  . methocarbamol (ROBAXIN) 500 MG tablet Take 1 tablet (500 mg total) by mouth 2 (two) times daily as needed for muscle spasms. (Patient not taking: Reported on 03/25/2018) 20 tablet 0  . triamcinolone (KENALOG) 0.025 % ointment Apply 1 application topically 2 (two) times daily. (Patient not taking: Reported on 03/25/2018) 30 g 0   No current facility-administered medications on file prior to visit.     Allergies  Allergen Reactions  . Azithromycin Nausea And Vomiting  . Cardizem [Diltiazem Hcl] Nausea And Vomiting  . Codeine Other (See Comments)    Feeling weird   . Coreg [Carvedilol] Nausea And Vomiting  . Demerol [Meperidine] Other (See Comments)    Per pt disoriented  . Erythromycin Nausea And Vomiting    Very sick  . Inderal [Propranolol] Other (See Comments)    Made me cry  . Procardia [Nifedipine] Other (See Comments)    Disoriented, sick  . Wellbutrin [Bupropion] Other (See Comments)    Unknown reaction  . Zoloft [Sertraline Hcl] Other (See Comments)    Could not talk, stiff     Objective:  BP 126/70 (BP Location: Left Arm, Patient Position: Sitting, Cuff Size: Normal)   Pulse 63   Temp 98.3 F (36.8 C) (Oral)   Resp 16   Ht '5\' 3"'  (1.6 m)   Wt 177 lb 12.8 oz (80.6 kg)   SpO2 97%   BMI 31.50 kg/m   Physical Exam  Neurological:  Right-sided facial drooping  Psychiatric: Her behavior is normal. Judgment and thought  content normal. Her speech is slurred. Cognition and memory are normal. She exhibits a depressed mood.    Assessment and Plan :  1. Type 2 diabetes mellitus with complication, with long-term current use of insulin (HCC) - Pt has h/o uncontrolled DM 2/2 medication non-compliance and non DM-friendly diet. Discussed need for better control and lifestyle. RTC in 4 weeks.  - Basic Metabolic Panel - Hemoglobin A1c  2. Screening for deficiency anemia - CBC with Differential/Platelet  3. Encounter for examination following treatment at hospital 4. Chronic fatigue - Home health is finished however Mrs. Morea is very anxious about not having help at home. She expresses concern regarding her husband not taking  proper care of her, including regularly scheduled meals, not giving her medications on time, bathing her too roughly. Will request further help from connected care.  - Ambulatory referral to Parks, PA-C  Primary Care at [place] 03/25/2018 3:17 PM

## 2018-03-25 NOTE — Patient Instructions (Addendum)
  Come back and see me next week to follow-up on your lab work.   I am sending someone to your home to help you bathe.    For therapy look into making an appointment at any of the following:  [facility] ([address] [place]) - [phone] [place]) [phone] [facility] - accepts most major insurances. [phone] or email [email or web address]   Thank you for coming in today. I hope you feel we met your needs.  Feel free to call PCP if you have any questions or further requests.  Please consider signing up for MyChart if you do not already have it, as this is a great way to communicate with me.  Best,  Whitney McVey, PA-C   IF you received an x-ray today, you will receive an invoice from [facility]. Please contact [facility] at [phone] with questions or concerns regarding your invoice.   IF you received labwork today, you will receive an invoice from [facility]. Please contact [facility] at [phone] with questions or concerns regarding your invoice.   Our billing staff will not be able to assist you with questions regarding bills from these companies.  You will be contacted with the lab results as soon as they are available. The fastest way to get your results is to activate your My Chart account. Instructions are located on the last page of this paperwork. If you have not heard from [place] regarding the results in 2 weeks, please contact this office.

## 2018-03-26 LAB — CBC WITH DIFFERENTIAL/PLATELET
Basophils Absolute: 0 10*3/uL (ref 0.0–0.2)
Basos: 0 %
EOS (ABSOLUTE): 0.2 10*3/uL (ref 0.0–0.4)
Eos: 2 %
Hematocrit: 35.1 % (ref 34.0–46.6)
Hemoglobin: 11.4 g/dL (ref 11.1–15.9)
Immature Grans (Abs): 0 10*3/uL (ref 0.0–0.1)
Immature Granulocytes: 0 %
Lymphocytes Absolute: 2.2 10*3/uL (ref 0.7–3.1)
Lymphs: 27 %
MCH: 26.6 pg (ref 26.6–33.0)
MCHC: 32.5 g/dL (ref 31.5–35.7)
MCV: 82 fL (ref 79–97)
Monocytes Absolute: 0.6 10*3/uL (ref 0.1–0.9)
Monocytes: 8 %
Neutrophils Absolute: 5 10*3/uL (ref 1.4–7.0)
Neutrophils: 63 %
Platelets: 265 10*3/uL (ref 150–379)
RBC: 4.29 x10E6/uL (ref 3.77–5.28)
RDW: 15.7 % — ABNORMAL HIGH (ref 12.3–15.4)
WBC: 8 10*3/uL (ref 3.4–10.8)

## 2018-03-26 LAB — BASIC METABOLIC PANEL
BUN/Creatinine Ratio: 36 — ABNORMAL HIGH (ref 12–28)
BUN: 32 mg/dL — ABNORMAL HIGH (ref 8–27)
Calcium: 9.5 mg/dL (ref 8.7–10.3)
Chloride: 99 mmol/L (ref 96–106)
Creatinine, Ser: 0.9 mg/dL (ref 0.57–1.00)
GFR calc Af Amer: 71 mL/min/{1.73_m2} (ref >59)

## 2018-03-26 LAB — HEMOGLOBIN A1C
Est. average glucose Bld gHb Est-mCnc: 240 mg/dL
Hgb A1c MFr Bld: 10 % — ABNORMAL HIGH (ref 4.8–5.6)

## 2018-03-26 LAB — BASIC METABOLIC PANEL WITH GFR
CO2: 25 mmol/L (ref 20–29)
GFR calc non Af Amer: 62 mL/min/{1.73_m2} (ref >59)
Glucose: 119 mg/dL — ABNORMAL HIGH (ref 65–99)
Potassium: 4.2 mmol/L (ref 3.5–5.2)
Sodium: 139 mmol/L (ref 134–144)

## 2018-04-01 ENCOUNTER — Ambulatory Visit (INDEPENDENT_AMBULATORY_CARE_PROVIDER_SITE_OTHER): Payer: Medicare Other | Admitting: Physician Assistant

## 2018-04-01 ENCOUNTER — Encounter: Payer: Self-pay | Admitting: Physician Assistant

## 2018-04-01 VITALS — BP 126/89 | HR 94 | Temp 98.9°F | Resp 17 | Ht 63.0 in | Wt 175.0 lb

## 2018-04-01 DIAGNOSIS — Z8669 Personal history of other diseases of the nervous system and sense organs: Secondary | ICD-10-CM | POA: Diagnosis not present

## 2018-04-01 DIAGNOSIS — F4323 Adjustment disorder with mixed anxiety and depressed mood: Secondary | ICD-10-CM

## 2018-04-01 NOTE — Progress Notes (Signed)
[name]  MRN: [id number] DOB: 1941-06-21  PCP: Dorise Hiss, PA-C  Subjective:  Pt is a 77 year old female who presents to clinic to discuss recent labs and for referral.  She is unpleased with the care she is receiving from her husband at home. Diabetes is not well controlled. "things aren't timely in my care from [facility]". Her husband takes charge of everything including feeding pt, bathing pt, providing medications (per pt). She has a difficulty time with ADLs due to h/o EBV meningitis and most recently a fall and then Bell's Palsy.  He is not a pleasant person to live with (per pt). "His attitude is like taking poison every day".  However when she thinks about leaving him she gets anxious and sad bc he needs someone.   Review of Systems  Endocrine: Negative for polydipsia, polyphagia and polyuria.  Musculoskeletal: Positive for arthralgias and gait problem.  Neurological: Negative for dizziness and headaches.  Psychiatric/Behavioral: Positive for dysphoric mood. The patient is nervous/anxious.     Patient Active Problem List   Diagnosis Date Noted  . Dehydration 03/17/2018  . Generalized weakness 03/17/2018  . Near syncope 03/17/2018  . ARF (acute renal failure) ([place]) 03/16/2018  . Hyperglycemia 09/25/2017  . Type 2 diabetes mellitus without complication, with long-term current use of insulin ([place]) 04/24/2017  . ADD (attention deficit disorder) 12/30/2016  . Narcotic addiction ([place]) 12/05/2014  . Excessive daytime sleepiness 12/05/2014  . Alteration in psychomotor activity 12/05/2014  . Depression with somatization 12/05/2014  . Hypersomnia, persistent 09/10/2013  . Obesity, unspecified 09/10/2013  . Chronic pain syndrome 09/10/2013  . Depression 07/31/2013  . Medication intolerance 07/19/2013  . Anxiety 07/19/2013  . CAD (coronary artery disease) status post stent to distal right coronary artery 2006 06/20/2013  . Neurocardiogenic syncope 06/20/2013  .  Essential hypertension 06/20/2013  . Hyperlipidemia 06/20/2013    Current Outpatient Medications on File Prior to Visit  Medication Sig Dispense Refill  . ACCU-CHEK SOFTCLIX LANCETS lancets USE TO TEST FOUR TIMES DAILY 100 each 0  . Alfalfa 250 MG TABS Take 1 tablet by mouth every morning.    Marland Kitchen aspirin 81 MG tablet Take 81 mg by mouth daily.    Marland Kitchen atenolol (TENORMIN) 25 MG tablet TAKE 1 TABLET BY MOUTH IN THE MORNING AND 2 IN THE EVENING 270 tablet 0  . blood glucose meter kit and supplies Dispense based on patient and insurance preference. Use up to four times daily as directed. (FOR ICD-9 250.00, 250.01). 1 each 0  . Cholecalciferol (VITAMIN D3) 5000 units TBDP Take 1 capsule by mouth daily.    . CHROMIUM ASPARTATE PO Take 1 tablet by mouth every morning.    Verneita Griffes Bark POWD Take 1 Dose by mouth daily.    . DHA-EPA-Coenzyme Q10-Vitamin E (GNP COQ-10 & FISH OIL) 120-180-50-30 CAPS Take 3 tablets by mouth daily.     . diazepam (VALIUM) 5 MG tablet Take 5 mg by mouth every 12 (twelve) hours as needed for anxiety.     . diclofenac sodium (VOLTAREN) 1 % GEL Apply 4 g topically 4 (four) times daily. 100 g 0  . fexofenadine (ALLEGRA) 180 MG tablet Take 180 mg by mouth daily as needed for allergies.     Marland Kitchen glucose 4 GM chewable tablet Chew 1 tablet by mouth as needed for low blood sugar.    Marland Kitchen glucose blood (ACCU-CHEK AVIVA PLUS) test strip USE TO TEST FOUR TIMES DAILY 100 each 0  .  hydrochlorothiazide (HYDRODIURIL) 25 MG tablet Take 25 mg by mouth daily.    Marland Kitchen HYDROcodone-acetaminophen (NORCO) 10-325 MG per tablet Take 1-2 tablets by mouth every 4 (four) hours as needed for moderate pain.     . Insulin Glargine (LANTUS SOLOSTAR) 100 UNIT/ML Solostar Pen Inject 24 Units into the skin daily at 10 pm. 5 pen PRN  . Insulin Syringe-Needle U-100 (INSULIN SYRINGE .5CC/31GX5/16") 31G X 5/16" 0.5 ML MISC Use as directed 100 each 0  . levothyroxine (SYNTHROID, LEVOTHROID) 50 MCG tablet Take 1 tablet (50 mcg  total) by mouth daily before breakfast. 30 tablet 5  . lisdexamfetamine (VYVANSE) 30 MG capsule Take 1 capsule (30 mg total) by mouth daily. 30 capsule 0  . lisinopril (PRINIVIL,ZESTRIL) 20 MG tablet TAKE 1 TABLET BY MOUTH IN THE MORNING AND 1/2 (ONE-HALF) AT BEDTIME 135 tablet 0  . magnesium oxide (MAG-OX) 400 MG tablet Take 400 mg by mouth 2 (two) times daily.    . metFORMIN (GLUCOPHAGE) 500 MG tablet Take 1 tablet (500 mg total) by mouth 2 (two) times daily with a meal. Wk1: 1/2 tab 2x/day; Wk2: 1 tab am, 1/2 tab pm; Wk3: 2 tabs 2x/day. 180 tablet 3  . methocarbamol (ROBAXIN) 500 MG tablet Take 1 tablet (500 mg total) by mouth 2 (two) times daily as needed for muscle spasms. 20 tablet 0  . Misc. Devices (HUGO ROLLING WALKER ELITE) MISC 1 Units by Does not apply route daily as needed. 1 each 0  . triamcinolone (KENALOG) 0.025 % ointment Apply 1 application topically 2 (two) times daily. 30 g 0  . venlafaxine (EFFEXOR) 100 MG tablet Take 1 tablet (100 mg total) by mouth daily. 30 tablet 2  . amphetamine-dextroamphetamine (ADDERALL) 20 MG tablet Take 1 tablet (20 mg total) by mouth 2 (two) times daily as needed. (Patient taking differently: Take 20 mg by mouth 2 (two) times daily as needed (chronic fatigue syndrome). ) 60 tablet 0   No current facility-administered medications on file prior to visit.     Allergies  Allergen Reactions  . Azithromycin Nausea And Vomiting  . Cardizem [Diltiazem Hcl] Nausea And Vomiting  . Codeine Other (See Comments)    Feeling weird   . Coreg [Carvedilol] Nausea And Vomiting  . Demerol [Meperidine] Other (See Comments)    Per pt disoriented  . Erythromycin Nausea And Vomiting    Very sick  . Inderal [Propranolol] Other (See Comments)    Made me cry  . Procardia [Nifedipine] Other (See Comments)    Disoriented, sick  . Wellbutrin [Bupropion] Other (See Comments)    Unknown reaction  . Zoloft [Sertraline Hcl] Other (See Comments)    Could not talk, stiff      Objective:  BP 126/89   Pulse 94   Temp 98.9 F (37.2 C) (Oral)   Resp 17   Ht '5\' 3"'  (1.6 m)   Wt 175 lb (79.4 kg)   SpO2 98%   BMI 31.00 kg/m   Physical Exam  Constitutional: She is oriented to person, place, and time. No distress.  Cardiovascular: Normal rate, regular rhythm and normal heart sounds.  Neurological: She is alert and oriented to person, place, and time.  Left facial paralysis  Skin: Skin is warm and dry.  Psychiatric: Judgment normal.  Vitals reviewed.   Assessment and Plan :  1. Adjustment disorder with mixed anxiety and depressed mood  - Ambulatory referral to Psychology 2. H/O Bell's palsy - Ambulatory referral to Physical Therapy - pt presents  with dysphoric mood since her recent fall and episode of Bell's Palsey. She has appt with neurologist Dr. Brett Fairy in 2 months. She has not been to PT for BP. Plan to refer. I feel that she would benefit from speaking with psychologist as she is navigating feelings surrounding home life and her health. She understands and agrees. RTC in 1 month to recheck blood sugar and kidney function.   Mercer Pod, PA-C  Primary Care at [place] 04/01/2018 4:48 PM

## 2018-04-01 NOTE — Patient Instructions (Addendum)
You will receive a phone call to schedule an appointment with physical therapy and psychology.   Come back and see me in 1 month to recheck your blood sugars and kidney function.    Thank you for coming in today. I hope you feel we met your needs.  Feel free to call PCP if you have any questions or further requests.  Please consider signing up for MyChart if you do not already have it, as this is a great way to communicate with me.  Best,  Whitney McVey, PA-C  IF you received an x-ray today, you will receive an invoice from [facility]. Please contact [facility] at [phone] with questions or concerns regarding your invoice.   IF you received labwork today, you will receive an invoice from [facility]. Please contact [facility] at [phone] with questions or concerns regarding your invoice.   Our billing staff will not be able to assist you with questions regarding bills from these companies.  You will be contacted with the lab results as soon as they are available. The fastest way to get your results is to activate your My Chart account. Instructions are located on the last page of this paperwork. If you have not heard from [place] regarding the results in 2 weeks, please contact this office.

## 2018-04-03 ENCOUNTER — Other Ambulatory Visit: Payer: Self-pay | Admitting: Physician Assistant

## 2018-04-03 DIAGNOSIS — F411 Generalized anxiety disorder: Secondary | ICD-10-CM

## 2018-04-03 DIAGNOSIS — I10 Essential (primary) hypertension: Secondary | ICD-10-CM

## 2018-04-03 NOTE — Telephone Encounter (Signed)
Pt called for an update on refill request. Adv pt we did recv'd med refill request and it is pending with PCP, med refill could take up to 3 business days.

## 2018-04-03 NOTE — Telephone Encounter (Signed)
Refill request for Atenolol 25 MG tab LOV 04/01/18  PCP Whitney McVey  Refill request for diazepam 5 MG Last addressed on 01/20/18 by PCP-start taking diazepam 3-4 times daily.

## 2018-04-03 NOTE — Telephone Encounter (Signed)
Med refill diazepam sent to [facility] Atenolol on the same request.

## 2018-04-06 NOTE — Telephone Encounter (Signed)
Patient is checking on the status. Please advise.

## 2018-04-13 DIAGNOSIS — M961 Postlaminectomy syndrome, not elsewhere classified: Secondary | ICD-10-CM | POA: Diagnosis not present

## 2018-04-13 DIAGNOSIS — Z79899 Other long term (current) drug therapy: Secondary | ICD-10-CM | POA: Diagnosis not present

## 2018-04-13 DIAGNOSIS — G894 Chronic pain syndrome: Secondary | ICD-10-CM | POA: Diagnosis not present

## 2018-04-13 DIAGNOSIS — M5136 Other intervertebral disc degeneration, lumbar region: Secondary | ICD-10-CM | POA: Diagnosis not present

## 2018-04-22 ENCOUNTER — Ambulatory Visit (INDEPENDENT_AMBULATORY_CARE_PROVIDER_SITE_OTHER): Payer: Medicare Other | Admitting: Physician Assistant

## 2018-04-22 ENCOUNTER — Encounter: Payer: Self-pay | Admitting: Family Medicine

## 2018-04-22 ENCOUNTER — Other Ambulatory Visit: Payer: Self-pay

## 2018-04-22 ENCOUNTER — Encounter: Payer: Self-pay | Admitting: Physician Assistant

## 2018-04-22 VITALS — BP 160/80 | HR 84 | Temp 98.7°F | Resp 16 | Ht 63.0 in | Wt 174.2 lb

## 2018-04-22 DIAGNOSIS — Z794 Long term (current) use of insulin: Secondary | ICD-10-CM

## 2018-04-22 DIAGNOSIS — E119 Type 2 diabetes mellitus without complications: Secondary | ICD-10-CM

## 2018-04-22 DIAGNOSIS — F4323 Adjustment disorder with mixed anxiety and depressed mood: Secondary | ICD-10-CM

## 2018-04-22 NOTE — Progress Notes (Signed)
[name]  MRN: [id number] DOB: 1941/01/17  PCP: Dorise Hiss, PA-C  Subjective:  Pt is a 77 year old female who presents to clinic for follow-up Bell's Palsy.   con't to have troubles with [name] and keeping her medications on schedule. She continues to pray that this will improve.   She is not taking metformin as directed. Home sugars are around 125.   Psych referral - [facility].   PT referral - Sent to [facility] . She is to fill out forms then call back to make the appointment.  Dr. Illene Bolus in [place] [facility].   Review of Systems  Gastrointestinal: Negative.   Skin: Negative.   Neurological: Positive for weakness (one sided).  Psychiatric/Behavioral: Positive for dysphoric mood.    Patient Active Problem List   Diagnosis Date Noted  . Dehydration 03/17/2018  . Generalized weakness 03/17/2018  . Near syncope 03/17/2018  . ARF (acute renal failure) ([place]) 03/16/2018  . Hyperglycemia 09/25/2017  . Type 2 diabetes mellitus without complication, with long-term current use of insulin ([place]) 04/24/2017  . ADD (attention deficit disorder) 12/30/2016  . Narcotic addiction ([place]) 12/05/2014  . Excessive daytime sleepiness 12/05/2014  . Alteration in psychomotor activity 12/05/2014  . Depression with somatization 12/05/2014  . Hypersomnia, persistent 09/10/2013  . Obesity, unspecified 09/10/2013  . Chronic pain syndrome 09/10/2013  . Depression 07/31/2013  . Medication intolerance 07/19/2013  . Anxiety 07/19/2013  . CAD (coronary artery disease) status post stent to distal right coronary artery 2006 06/20/2013  . Neurocardiogenic syncope 06/20/2013  . Essential hypertension 06/20/2013  . Hyperlipidemia 06/20/2013    Current Outpatient Medications on File Prior to Visit  Medication Sig Dispense Refill  . ACCU-CHEK SOFTCLIX LANCETS lancets USE TO TEST FOUR TIMES DAILY 100 each 0  . Alfalfa 250 MG  TABS Take 1 tablet by mouth every morning.    Marland Kitchen amphetamine-dextroamphetamine (ADDERALL) 20 MG tablet Take 1 tablet (20 mg total) by mouth 2 (two) times daily as needed. (Patient taking differently: Take 20 mg by mouth 2 (two) times daily as needed (chronic fatigue syndrome). ) 60 tablet 0  . aspirin 81 MG tablet Take 81 mg by mouth daily.    Marland Kitchen atenolol (TENORMIN) 25 MG tablet TAKE 1 TABLET BY MOUTH IN THE MORNING AND 2 IN THE EVENING 270 tablet 0  . blood glucose meter kit and supplies Dispense based on patient and insurance preference. Use up to four times daily as directed. (FOR ICD-9 250.00, 250.01). 1 each 0  . Cholecalciferol (VITAMIN D3) 5000 units TBDP Take 1 capsule by mouth daily.    . CHROMIUM ASPARTATE PO Take 1 tablet by mouth every morning.    Verneita Griffes Bark POWD Take 1 Dose by mouth daily.    . DHA-EPA-Coenzyme Q10-Vitamin E (GNP COQ-10 & FISH OIL) 120-180-50-30 CAPS Take 3 tablets by mouth daily.     . diazepam (VALIUM) 5 MG tablet Take 5 mg by mouth every 12 (twelve) hours as needed for anxiety.     . diazepam (VALIUM) 5 MG tablet TAKE 1 TABLET BY MOUTH EVERY 12 HOURS AS NEEDED FOR ANXIETY 60 tablet 1  . diclofenac sodium (VOLTAREN) 1 % GEL Apply 4 g topically 4 (four) times daily. 100 g 0  . fexofenadine (ALLEGRA) 180 MG tablet Take 180 mg by mouth daily as needed for allergies.     Marland Kitchen glucose 4 GM chewable tablet Chew 1 tablet by  mouth as needed for low blood sugar.    Marland Kitchen glucose blood (ACCU-CHEK AVIVA PLUS) test strip USE TO TEST FOUR TIMES DAILY 100 each 0  . hydrochlorothiazide (HYDRODIURIL) 25 MG tablet Take 25 mg by mouth daily.    Marland Kitchen HYDROcodone-acetaminophen (NORCO) 10-325 MG per tablet Take 1-2 tablets by mouth every 4 (four) hours as needed for moderate pain.     . Insulin Glargine (LANTUS SOLOSTAR) 100 UNIT/ML Solostar Pen Inject 24 Units into the skin daily at 10 pm. 5 pen PRN  . Insulin Syringe-Needle U-100 (INSULIN SYRINGE .5CC/31GX5/16") 31G X 5/16" 0.5 ML MISC Use as  directed 100 each 0  . levothyroxine (SYNTHROID, LEVOTHROID) 50 MCG tablet Take 1 tablet (50 mcg total) by mouth daily before breakfast. 30 tablet 5  . lisdexamfetamine (VYVANSE) 30 MG capsule Take 1 capsule (30 mg total) by mouth daily. 30 capsule 0  . lisinopril (PRINIVIL,ZESTRIL) 20 MG tablet TAKE 1 TABLET BY MOUTH IN THE MORNING AND 1/2 (ONE-HALF) AT BEDTIME 135 tablet 0  . magnesium oxide (MAG-OX) 400 MG tablet Take 400 mg by mouth 2 (two) times daily.    . metFORMIN (GLUCOPHAGE) 500 MG tablet Take 1 tablet (500 mg total) by mouth 2 (two) times daily with a meal. Wk1: 1/2 tab 2x/day; Wk2: 1 tab am, 1/2 tab pm; Wk3: 2 tabs 2x/day. 180 tablet 3  . methocarbamol (ROBAXIN) 500 MG tablet Take 1 tablet (500 mg total) by mouth 2 (two) times daily as needed for muscle spasms. 20 tablet 0  . Misc. Devices (HUGO ROLLING WALKER ELITE) MISC 1 Units by Does not apply route daily as needed. 1 each 0  . triamcinolone (KENALOG) 0.025 % ointment Apply 1 application topically 2 (two) times daily. 30 g 0  . venlafaxine (EFFEXOR) 100 MG tablet Take 1 tablet (100 mg total) by mouth daily. 30 tablet 2   No current facility-administered medications on file prior to visit.     Allergies  Allergen Reactions  . Azithromycin Nausea And Vomiting  . Cardizem [Diltiazem Hcl] Nausea And Vomiting  . Codeine Other (See Comments)    Feeling weird   . Coreg [Carvedilol] Nausea And Vomiting  . Demerol [Meperidine] Other (See Comments)    Per pt disoriented  . Erythromycin Nausea And Vomiting    Very sick  . Inderal [Propranolol] Other (See Comments)    Made me cry  . Procardia [Nifedipine] Other (See Comments)    Disoriented, sick  . Wellbutrin [Bupropion] Other (See Comments)    Unknown reaction  . Zoloft [Sertraline Hcl] Other (See Comments)    Could not talk, stiff     Objective:  BP (!) 160/80 (BP Location: Left Arm, Patient Position: Sitting, Cuff Size: Normal)   Pulse 84   Temp 98.7 F (37.1 C) (Oral)    Resp 16   Ht '5\' 3"'  (1.6 m)   Wt 174 lb 3.2 oz (79 kg)   SpO2 96%   BMI 30.86 kg/m   Physical Exam  Constitutional: She is oriented to person, place, and time. No distress.  Cardiovascular: Normal rate, regular rhythm and normal heart sounds.  Neurological: She is alert and oriented to person, place, and time.  Left-sided facial drooping - improving  Skin: Skin is warm and dry.  Psychiatric: Judgment normal.  Vitals reviewed.   Assessment and Plan :  1. Adjustment disorder with mixed anxiety and depressed mood - Pt increasingly worries about getting meds on time at home. She has had home care in  the past, I feel she would benefit from this again - will consider this in the future. Advised pt to speak with therapist as she seems to be more and more down and blue recently. Denies HI or SI.   2. Type 2 diabetes mellitus without complication, with long-term current use of insulin ([facility]) - Metformin bid, con't insulin as directed. Encouraged DM-Friendly diet. RTC in 2 months for recheck A1C.    Mercer Pod, PA-C  [facility] 04/22/2018 5:26 PM

## 2018-04-22 NOTE — Patient Instructions (Addendum)
Start taking Metformin twice a day - take this in the morning with breakfast and again at night with a meal.  Continue taking your insulin as directed.    Please call [facility] [phone] - you have been approved for a referral. I'm sure our referral team tried to call you to schedule this. Give them a call back.   Come back and see me in 2 months. We will recheck your blood sugar.    Thank you for coming in today. I hope you feel we met your needs.  Feel free to call PCP if you have any questions or further requests.  Please consider signing up for MyChart if you do not already have it, as this is a great way to communicate with me.  Best,  Whitney McVey, PA-C  IF you received an x-ray today, you will receive an invoice from [facility]. Please contact [facility] at [phone] with questions or concerns regarding your invoice.   IF you received labwork today, you will receive an invoice from [facility]. Please contact [facility] at [phone] with questions or concerns regarding your invoice.   Our billing staff will not be able to assist you with questions regarding bills from these companies.  You will be contacted with the lab results as soon as they are available. The fastest way to get your results is to activate your My Chart account. Instructions are located on the last page of this paperwork. If you have not heard from [place] regarding the results in 2 weeks, please contact this office.

## 2018-04-23 ENCOUNTER — Ambulatory Visit: Payer: Self-pay | Admitting: *Deleted

## 2018-04-23 NOTE — Telephone Encounter (Signed)
Pt reports she tripped and fell to floor last night. Fall unwitnessed. Reports hit right cheekbone and side of head. Also states right arm "jammed" painful and right knee painful. Unable to score. States difficult to move right arm due to pain. Speech slurred but pt with h/o Bells Palsy, difficult to understand. Pt also has difficulty focusing on assessment screening.  Spoke to husband who states baseline for patient. Husband states cheekbone swollen. H/O falls. Directed to ED. Husband states he will drive or call EMS.  Reason for Disposition . Sounds like a serious injury to the triager    Multiple locations of injury  Answer Assessment - Initial Assessment Questions 1. MECHANISM: "How did the injury happen?" For falls, ask: "What height did you fall from?" and "What surface did you fall against?"     Tripped and fell to floor last night 2. ONSET: "When did the injury happen?" (Minutes or hours ago)      Last night, unsure of time 3. NEUROLOGIC SYMPTOMS: "Was there any loss of consciousness?" "Are there any other neurological symptoms?"     Unknown, unwitnessed 4. MENTAL STATUS: "Does the person know who he is, who you are, and where he is?"      yes 5. LOCATION: "What part of the head was hit?"      Rt cheekbone, side of head 6. SCALP APPEARANCE: "What does the scalp look like? Is it bleeding now?" If so, ask: "Is it difficult to stop?"      Swelling 7. SIZE: For cuts, bruises, or swelling, ask: "How large is it?" (e.g., inches or centimeters)      n/a 8. PAIN: "Is there any pain?" If so, ask: "How bad is it?"  (e.g., Scale 1-10; or mild, moderate, severe)     Yes could not rate 9. TETANUS: For any breaks in the skin, ask: "When was the last tetanus booster?"      10. OTHER SYMPTOMS: "Do you have any other symptoms?" (e.g., neck pain, vomiting)       Right arm "jammed" painful, right knee.  Protocols used: HEAD INJURY-A-AH

## 2018-04-28 ENCOUNTER — Telehealth: Payer: Self-pay | Admitting: Physician Assistant

## 2018-04-28 ENCOUNTER — Other Ambulatory Visit: Payer: Self-pay | Admitting: Physician Assistant

## 2018-04-28 DIAGNOSIS — R5381 Other malaise: Secondary | ICD-10-CM

## 2018-04-28 NOTE — Telephone Encounter (Signed)
Copied from CRM [phone]. Topic: Quick Communication - See Telephone Encounter >> Apr 28, 2018  2:53 PM Arlyss Gandyichardson, Arber Wiemers N, NT wrote: CRM for notification. See Telephone encounter for: 04/28/18. [facility] calling and states they have faxed several times for a pt to get the rx soft click lancet so that medicare will cover this and the rx has been sent back with all info that is needed. [facility] states that for Medicare Part B guidelines they need dx code, signed and dated, directions, 5 refills or less. Fax#: [phone] They are refaxing what they need now.

## 2018-04-29 ENCOUNTER — Other Ambulatory Visit: Payer: Self-pay | Admitting: Physician Assistant

## 2018-04-29 ENCOUNTER — Ambulatory Visit: Payer: Medicare Other | Admitting: Physician Assistant

## 2018-04-29 DIAGNOSIS — E118 Type 2 diabetes mellitus with unspecified complications: Secondary | ICD-10-CM

## 2018-04-29 MED ORDER — ACCU-CHEK SOFTCLIX LANCETS MISC: 5 refills | Status: AC

## 2018-04-29 NOTE — Telephone Encounter (Signed)
Request given [employer]

## 2018-05-04 DIAGNOSIS — E119 Type 2 diabetes mellitus without complications: Secondary | ICD-10-CM | POA: Diagnosis not present

## 2018-05-04 DIAGNOSIS — G510 Bell's palsy: Secondary | ICD-10-CM | POA: Diagnosis not present

## 2018-05-04 DIAGNOSIS — Z961 Presence of intraocular lens: Secondary | ICD-10-CM | POA: Diagnosis not present

## 2018-05-06 ENCOUNTER — Other Ambulatory Visit: Payer: Self-pay | Admitting: Physician Assistant

## 2018-05-06 DIAGNOSIS — I10 Essential (primary) hypertension: Secondary | ICD-10-CM

## 2018-05-06 DIAGNOSIS — F419 Anxiety disorder, unspecified: Secondary | ICD-10-CM

## 2018-05-06 NOTE — Telephone Encounter (Signed)
Copied from CRM [phone]. Topic: Quick Communication - Rx Refill/Question >> May 06, 2018  5:35 PM Mila PalmerMcGee, [place]Demi B, NT wrote: Medication: lisinopril (PRINIVIL,ZESTRIL) 20 MG tablet  hydrochlorothiazide (HYDRODIURIL) 25 MG tablet  levothyroxine (SYNTHROID, LEVOTHROID) 50 MCG tablet    Has the patient contacted their pharmacy? Yes.   (Agent: If no, request that the patient contact the pharmacy for the refill.) (Agent: If yes, when and what did the pharmacy advise?)  Preferred Pharmacy (with phone number or street name): [id number] - [place], [place] - [phone]  Agent: Please be advised that RX refills may take up to 3 business days. We ask that you follow-up with your pharmacy.

## 2018-05-07 NOTE — Telephone Encounter (Signed)
Rx refill request for chronic medications: levothyroxine 50 mcg   Last filled: 03/07/17 #30- no current mention of problem or diagnosis code listed in chart. Lab is current and WNL: 03/17/18 Lisinopril 20 mg ( last filled: 08/02/17 #135) and hydrochlorothiazide 25 mg  ( last filled: 03/25/18- listed as historical)  Patient is current with appointments in the office- may need formal CPE.  LOV: 04/01/18  PCP: McVey  Pharmacy: verified

## 2018-05-08 NOTE — Telephone Encounter (Signed)
Message to McVey.  Appears pt is on Atenolol, HCTZ and Lisinopril Please advise on refills for HCTZ and Lisinopril

## 2018-05-09 ENCOUNTER — Telehealth: Payer: Self-pay | Admitting: Physician Assistant

## 2018-05-09 NOTE — Telephone Encounter (Signed)
[facility]ax from [facility] states that the pt has chosen not to attend due to financial reasons.  They do not accept medicare or medicaid and did not want to self pay

## 2018-05-14 ENCOUNTER — Telehealth: Payer: Self-pay | Admitting: Physician Assistant

## 2018-05-14 NOTE — Telephone Encounter (Signed)
Pt has been scheduled with [facility] for 05/20/18 at 3pm

## 2018-05-19 ENCOUNTER — Emergency Department (HOSPITAL_COMMUNITY): Payer: Medicare Other

## 2018-05-19 ENCOUNTER — Other Ambulatory Visit: Payer: Self-pay

## 2018-05-19 ENCOUNTER — Ambulatory Visit (HOSPITAL_BASED_OUTPATIENT_CLINIC_OR_DEPARTMENT_OTHER): Payer: Medicare Other

## 2018-05-19 ENCOUNTER — Encounter (HOSPITAL_COMMUNITY): Payer: Self-pay | Admitting: Emergency Medicine

## 2018-05-19 ENCOUNTER — Observation Stay (HOSPITAL_COMMUNITY)
Admission: EM | Admit: 2018-05-19 | Discharge: 2018-05-20 | Disposition: A | Discharge status: Home Health | Payer: Medicare Other | Attending: Internal Medicine | Admitting: Internal Medicine

## 2018-05-19 DIAGNOSIS — I639 Cerebral infarction, unspecified: Principal | ICD-10-CM | POA: Diagnosis present

## 2018-05-19 DIAGNOSIS — G4719 Other hypersomnia: Secondary | ICD-10-CM | POA: Diagnosis present

## 2018-05-19 DIAGNOSIS — Z794 Long term (current) use of insulin: Secondary | ICD-10-CM | POA: Insufficient documentation

## 2018-05-19 DIAGNOSIS — R402441 Other coma, without documented Glasgow coma scale score, or with partial score reported, in the field [EMT or ambulance]: Secondary | ICD-10-CM | POA: Diagnosis not present

## 2018-05-19 DIAGNOSIS — R2689 Other abnormalities of gait and mobility: Secondary | ICD-10-CM | POA: Diagnosis not present

## 2018-05-19 DIAGNOSIS — I340 Nonrheumatic mitral (valve) insufficiency: Secondary | ICD-10-CM

## 2018-05-19 DIAGNOSIS — I6602 Occlusion and stenosis of left middle cerebral artery: Secondary | ICD-10-CM | POA: Diagnosis not present

## 2018-05-19 DIAGNOSIS — G894 Chronic pain syndrome: Secondary | ICD-10-CM | POA: Diagnosis not present

## 2018-05-19 DIAGNOSIS — I2510 Atherosclerotic heart disease of native coronary artery without angina pectoris: Secondary | ICD-10-CM | POA: Diagnosis not present

## 2018-05-19 DIAGNOSIS — E119 Type 2 diabetes mellitus without complications: Secondary | ICD-10-CM

## 2018-05-19 DIAGNOSIS — Z79899 Other long term (current) drug therapy: Secondary | ICD-10-CM | POA: Insufficient documentation

## 2018-05-19 DIAGNOSIS — E669 Obesity, unspecified: Secondary | ICD-10-CM | POA: Diagnosis present

## 2018-05-19 DIAGNOSIS — S0540XA Penetrating wound of orbit with or without foreign body, unspecified eye, initial encounter: Secondary | ICD-10-CM

## 2018-05-19 DIAGNOSIS — R2990 Unspecified symptoms and signs involving the nervous system: Secondary | ICD-10-CM

## 2018-05-19 DIAGNOSIS — I10 Essential (primary) hypertension: Secondary | ICD-10-CM | POA: Diagnosis not present

## 2018-05-19 DIAGNOSIS — R8290 Unspecified abnormal findings in urine: Secondary | ICD-10-CM | POA: Insufficient documentation

## 2018-05-19 DIAGNOSIS — R4781 Slurred speech: Secondary | ICD-10-CM | POA: Diagnosis not present

## 2018-05-19 DIAGNOSIS — Z955 Presence of coronary angioplasty implant and graft: Secondary | ICD-10-CM | POA: Diagnosis not present

## 2018-05-19 DIAGNOSIS — F419 Anxiety disorder, unspecified: Secondary | ICD-10-CM | POA: Diagnosis present

## 2018-05-19 DIAGNOSIS — R4701 Aphasia: Secondary | ICD-10-CM | POA: Diagnosis not present

## 2018-05-19 DIAGNOSIS — F1120 Opioid dependence, uncomplicated: Secondary | ICD-10-CM | POA: Diagnosis not present

## 2018-05-19 DIAGNOSIS — R410 Disorientation, unspecified: Secondary | ICD-10-CM | POA: Diagnosis not present

## 2018-05-19 DIAGNOSIS — R4702 Dysphasia: Secondary | ICD-10-CM | POA: Diagnosis not present

## 2018-05-19 DIAGNOSIS — I6523 Occlusion and stenosis of bilateral carotid arteries: Secondary | ICD-10-CM | POA: Diagnosis not present

## 2018-05-19 DIAGNOSIS — Z7982 Long term (current) use of aspirin: Secondary | ICD-10-CM | POA: Diagnosis not present

## 2018-05-19 DIAGNOSIS — E1165 Type 2 diabetes mellitus with hyperglycemia: Secondary | ICD-10-CM | POA: Diagnosis not present

## 2018-05-19 DIAGNOSIS — R29810 Facial weakness: Secondary | ICD-10-CM | POA: Diagnosis not present

## 2018-05-19 LAB — CBC
HCT: 38.4 % (ref 36.0–46.0)
Hemoglobin: 12.1 g/dL (ref 12.0–15.0)
MCH: 26.5 pg (ref 26.0–34.0)
MCHC: 31.5 g/dL (ref 30.0–36.0)
MCV: 84 fL (ref 78.0–100.0)
PLATELETS: 274 10*3/uL (ref 150–400)
RBC: 4.57 MIL/uL (ref 3.87–5.11)
RDW: 14.6 % (ref 11.5–15.5)
WBC: 8.6 10*3/uL (ref 4.0–10.5)

## 2018-05-19 LAB — COMPREHENSIVE METABOLIC PANEL
ALT: 19 U/L (ref 0–44)
AST: 21 U/L (ref 15–41)
Albumin: 3.5 g/dL (ref 3.5–5.0)
Alkaline Phosphatase: 86 U/L (ref 38–126)
Anion gap: 13 (ref 5–15)
BUN: 35 mg/dL — ABNORMAL HIGH (ref 8–23)
CHLORIDE: 95 mmol/L — AB (ref 98–111)
CO2: 24 mmol/L (ref 22–32)
Calcium: 9.4 mg/dL (ref 8.9–10.3)
Creatinine, Ser: 1.09 mg/dL — ABNORMAL HIGH (ref 0.44–1.00)
GFR, EST AFRICAN AMERICAN: 55 mL/min — AB (ref >60)
GFR, EST NON AFRICAN AMERICAN: 47 mL/min — AB (ref >60)
Glucose, Bld: 249 mg/dL — ABNORMAL HIGH (ref 70–99)
POTASSIUM: 3.2 mmol/L — AB (ref 3.5–5.1)
SODIUM: 132 mmol/L — AB (ref 135–145)
Total Bilirubin: 0.6 mg/dL (ref 0.3–1.2)
Total Protein: 6.4 g/dL — ABNORMAL LOW (ref 6.5–8.1)

## 2018-05-19 LAB — DIFFERENTIAL
Abs Immature Granulocytes: 0 10*3/uL (ref 0.0–0.1)
BASOS ABS: 0.1 10*3/uL (ref 0.0–0.1)
BASOS PCT: 1 %
EOS ABS: 0.2 10*3/uL (ref 0.0–0.7)
Eosinophils Relative: 3 %
Immature Granulocytes: 0 %
Lymphocytes Relative: 24 %
Lymphs Abs: 2.1 10*3/uL (ref 0.7–4.0)
MONO ABS: 0.9 10*3/uL (ref 0.1–1.0)
MONOS PCT: 10 %
NEUTROS PCT: 62 %
Neutro Abs: 5.3 10*3/uL (ref 1.7–7.7)

## 2018-05-19 LAB — URINALYSIS, ROUTINE W REFLEX MICROSCOPIC
Bilirubin Urine: NEGATIVE
Glucose, UA: 50 mg/dL — AB
HGB URINE DIPSTICK: NEGATIVE
Ketones, ur: NEGATIVE mg/dL
Nitrite: NEGATIVE
PROTEIN: NEGATIVE mg/dL
Specific Gravity, Urine: 1.015 (ref 1.005–1.030)
pH: 5 (ref 5.0–8.0)

## 2018-05-19 LAB — APTT: APTT: 29 s (ref 24–36)

## 2018-05-19 LAB — CREATININE, SERUM: CREATININE: 0.83 mg/dL (ref 0.44–1.00)

## 2018-05-19 LAB — RAPID URINE DRUG SCREEN, HOSP PERFORMED
AMPHETAMINES: POSITIVE — AB
BENZODIAZEPINES: POSITIVE — AB
COCAINE: NOT DETECTED
OPIATES: POSITIVE — AB
Tetrahydrocannabinol: NOT DETECTED

## 2018-05-19 LAB — LIPID PANEL
Cholesterol: 287 mg/dL — ABNORMAL HIGH (ref 0–200)
HDL: 42 mg/dL (ref >40)
LDL CALC: 188 mg/dL — AB (ref 0–99)
TRIGLYCERIDES: 283 mg/dL — AB (ref <150)
Total CHOL/HDL Ratio: 6.8 RATIO
VLDL: 57 mg/dL — AB (ref 0–40)

## 2018-05-19 LAB — PROTIME-INR
INR: 0.98
PROTHROMBIN TIME: 12.9 s (ref 11.4–15.2)

## 2018-05-19 LAB — GLUCOSE, CAPILLARY
Glucose-Capillary: 201 mg/dL — ABNORMAL HIGH (ref 70–99)
Glucose-Capillary: 197 mg/dL — ABNORMAL HIGH (ref 70–99)
Glucose-Capillary: 279 mg/dL — ABNORMAL HIGH (ref 70–99)

## 2018-05-19 LAB — I-STAT CHEM 8, ED
BUN: 34 mg/dL — ABNORMAL HIGH (ref 8–23)
CHLORIDE: 95 mmol/L — AB (ref 98–111)
CREATININE: 1 mg/dL (ref 0.44–1.00)
Calcium, Ion: 1.09 mmol/L — ABNORMAL LOW (ref 1.15–1.40)
Glucose, Bld: 246 mg/dL — ABNORMAL HIGH (ref 70–99)
HEMATOCRIT: 37 % (ref 36.0–46.0)
Hemoglobin: 12.6 g/dL (ref 12.0–15.0)
POTASSIUM: 3.2 mmol/L — AB (ref 3.5–5.1)
SODIUM: 133 mmol/L — AB (ref 135–145)
TCO2: 24 mmol/L (ref 22–32)

## 2018-05-19 LAB — CBG MONITORING, ED: Glucose-Capillary: 241 mg/dL — ABNORMAL HIGH (ref 70–99)

## 2018-05-19 LAB — HEMOGLOBIN A1C
HEMOGLOBIN A1C: 9.3 % — AB (ref 4.8–5.6)
Mean Plasma Glucose: 220.21 mg/dL

## 2018-05-19 LAB — ETHANOL: Alcohol, Ethyl (B): <10 mg/dL (ref <10)

## 2018-05-19 LAB — ECHOCARDIOGRAM COMPLETE: Weight: 2853.63 oz

## 2018-05-19 LAB — I-STAT TROPONIN, ED: TROPONIN I, POC: 0.01 ng/mL (ref 0.00–0.08)

## 2018-05-19 MED ORDER — INSULIN ASPART 100 UNIT/ML ~~LOC~~ SOLN
0.0000 [IU] | Freq: Every day | SUBCUTANEOUS | Status: DC
  Administered 2018-05-19: 3 [IU] via SUBCUTANEOUS

## 2018-05-19 MED ORDER — INSULIN GLARGINE 100 UNIT/ML ~~LOC~~ SOLN
12.0000 [IU] | Freq: Every day | SUBCUTANEOUS | Status: DC
  Administered 2018-05-19: 12 [IU] via SUBCUTANEOUS
  Filled 2018-05-19 (×2): qty 0.12

## 2018-05-19 MED ORDER — LACTATED RINGERS IV SOLN
INTRAVENOUS | Status: AC
  Administered 2018-05-19: 17:00:00 via INTRAVENOUS

## 2018-05-19 MED ORDER — STROKE: EARLY STAGES OF RECOVERY BOOK
Freq: Once | Status: AC
  Administered 2018-05-19: 23:00:00

## 2018-05-19 MED ORDER — HEPARIN SODIUM (PORCINE) 5000 UNIT/ML IJ SOLN
5000.0000 [IU] | Freq: Three times a day (TID) | INTRAMUSCULAR | Status: DC
  Administered 2018-05-19 – 2018-05-20 (×4): 5000 [IU] via SUBCUTANEOUS
  Filled 2018-05-19 (×4): qty 1

## 2018-05-19 MED ORDER — INSULIN ASPART 100 UNIT/ML ~~LOC~~ SOLN
0.0000 [IU] | Freq: Three times a day (TID) | SUBCUTANEOUS | Status: DC
  Administered 2018-05-19: 3 [IU] via SUBCUTANEOUS
  Administered 2018-05-19: 2 [IU] via SUBCUTANEOUS
  Administered 2018-05-20: 3 [IU] via SUBCUTANEOUS
  Administered 2018-05-20 (×2): 2 [IU] via SUBCUTANEOUS

## 2018-05-19 MED ORDER — SODIUM CHLORIDE 0.9 % IV BOLUS
500.0000 mL | Freq: Once | INTRAVENOUS | Status: AC
  Administered 2018-05-19: 500 mL via INTRAVENOUS

## 2018-05-19 MED ORDER — HYDRALAZINE HCL 20 MG/ML IJ SOLN: 10.0000 mg | Freq: Four times a day (QID) | INTRAMUSCULAR | Status: DC | PRN

## 2018-05-19 MED ORDER — VENLAFAXINE HCL 50 MG PO TABS
100.0000 mg | ORAL_TABLET | Freq: Every day | ORAL | Status: DC
  Administered 2018-05-19 – 2018-05-20 (×2): 100 mg via ORAL
  Filled 2018-05-19 (×2): qty 2

## 2018-05-19 MED ORDER — IOPAMIDOL (ISOVUE-370) INJECTION 76%
50.0000 mL | Freq: Once | INTRAVENOUS | Status: AC | PRN
  Administered 2018-05-19: 50 mL via INTRAVENOUS

## 2018-05-19 MED ORDER — LORAZEPAM 2 MG/ML IJ SOLN: 1.0000 mg | Freq: Once | INTRAMUSCULAR | Status: DC | PRN

## 2018-05-19 MED ORDER — ATORVASTATIN CALCIUM 40 MG PO TABS
40.0000 mg | ORAL_TABLET | Freq: Every day | ORAL | Status: DC
  Administered 2018-05-19: 40 mg via ORAL
  Filled 2018-05-19 (×2): qty 1

## 2018-05-19 MED ORDER — POTASSIUM CHLORIDE 10 MEQ/100ML IV SOLN
10.0000 meq | INTRAVENOUS | Status: AC
  Administered 2018-05-19 (×3): 10 meq via INTRAVENOUS
  Filled 2018-05-19 (×3): qty 100

## 2018-05-19 MED ORDER — IOPAMIDOL (ISOVUE-370) INJECTION 76%
INTRAVENOUS | Status: AC
  Filled 2018-05-19: qty 50

## 2018-05-19 MED ORDER — ASPIRIN 81 MG PO CHEW
81.0000 mg | CHEWABLE_TABLET | Freq: Every day | ORAL | Status: DC
  Administered 2018-05-19 – 2018-05-20 (×2): 81 mg via ORAL
  Filled 2018-05-19 (×2): qty 1

## 2018-05-19 MED ORDER — DIAZEPAM 2 MG PO TABS
2.0000 mg | ORAL_TABLET | Freq: Two times a day (BID) | ORAL | Status: DC | PRN
  Administered 2018-05-19: 2 mg via ORAL
  Filled 2018-05-19: qty 1

## 2018-05-19 MED ORDER — CLOPIDOGREL BISULFATE 75 MG PO TABS
75.0000 mg | ORAL_TABLET | Freq: Every day | ORAL | Status: DC
  Administered 2018-05-19 – 2018-05-20 (×2): 75 mg via ORAL
  Filled 2018-05-19 (×2): qty 1

## 2018-05-19 NOTE — ED Notes (Signed)
Patient resting on stretcher, alert oriented , speech is slow however clear.

## 2018-05-19 NOTE — ED Notes (Signed)
Pure wick applied. Patient is alert oriented. Dr. Eudelia Bunchardama at bedside. Patient is aware she will be admitted to the hospital.

## 2018-05-19 NOTE — Progress Notes (Addendum)
STROKE TEAM PROGRESS NOTE  HPI: [name] is a 77 y.o. female coronary artery disease, hypertension, hypersomnia, diabetes, hyperlipidemia, hypertension, narcotic overuse, brought in for acute onset of word finding difficulty and confusion.  Has a history of Bell's palsy with right facial weakness. She was last known normal at 12:01 AM on 02/16/2018, when her husband noticed that she appeared more confused and her speech sounded more slurred. EMS was called.  They noted her to be dysarthric.  No focal weakness.  They also noted that she was having word finding difficulty at the time. She was seen and examined in the emergency room.  Upon my initial evaluation, I asked her if she had taken any medications prior to all the symptom onset.  She said she took her atenolol as well as hydrocodone.  She did seem slightly lethargic but was awake and was able to converse.  LKW: 12:01 AM on 02/16/2018 tpa given?: no, low NIH Premorbid modified Rankin scale (mRS): 0   INTERVAL HISTORY Her husband and RN are at the bedside.  Pt and husband recounted HPI. Speech trouble that started last night is improving today but not resolved.   Vitals:   05/19/18 0545 05/19/18 0600 05/19/18 0615 05/19/18 0826  BP: 135/61 137/86 (!) 157/75 96/67  Pulse: 75 72 74 70  Resp: 12 10 14 16   Temp:    97.8 F (36.6 C)  TempSrc:    Oral  SpO2: 96% 97% 97% 98%  Weight:        CBC:  Recent Labs  Lab 05/19/18 0239 05/19/18 0242  WBC 8.6  --   NEUTROABS 5.3  --   HGB 12.1 12.6  HCT 38.4 37.0  MCV 84.0  --   PLT 274  --     Basic Metabolic Panel:  Recent Labs  Lab 05/19/18 0239 05/19/18 0242  NA 132* 133*  K 3.2* 3.2*  CL 95* 95*  CO2 24  --   GLUCOSE 249* 246*  BUN 35* 34*  CREATININE 1.09* 1.00  CALCIUM 9.4  --    Lipid Panel:     Component Value Date/Time   CHOL 346 (H) 09/23/2017 1803   TRIG 408 (H) 09/23/2017 1803   HDL 56 09/23/2017 1803   CHOLHDL 6.2 (H) 09/23/2017 1803   CHOLHDL 4.6  11/21/2010 0520   VLDL 25 11/21/2010 0520   LDLCALC Comment 09/23/2017 1803   HgbA1c:  Lab Results  Component Value Date   HGBA1C 10.0 (H) 03/25/2018   Urine Drug Screen:     Component Value Date/Time   LABOPIA POSITIVE (A) 05/19/2018 0448   COCAINSCRNUR NONE DETECTED 05/19/2018 0448   LABBENZ POSITIVE (A) 05/19/2018 0448   AMPHETMU POSITIVE (A) 05/19/2018 0448   THCU NONE DETECTED 05/19/2018 0448   LABBARB (A) 05/19/2018 0448    Result not available. Reagent lot number recalled by manufacturer.    Alcohol Level     Component Value Date/Time   ETH <10 05/19/2018 0237    IMAGING Ct Angio Head W Or Wo Contrast  Result Date: 05/19/2018 CLINICAL DATA:  Initial evaluation for acute aphasia. EXAM: CT ANGIOGRAPHY HEAD AND NECK TECHNIQUE: Multidetector CT imaging of the head and neck was performed using the standard protocol during bolus administration of intravenous contrast. Multiplanar CT image reconstructions and MIPs were obtained to evaluate the vascular anatomy. Carotid stenosis measurements (when applicable) are obtained utilizing NASCET criteria, using the distal internal carotid diameter as the denominator. CONTRAST:  50mL ISOVUE-370 IOPAMIDOL (ISOVUE-370) INJECTION  76% COMPARISON:  None. FINDINGS: CTA NECK FINDINGS Aortic arch: Visualized aortic arch of normal caliber with normal 3 vessel morphology. Moderate atheromatous change about the arch and origin of the great vessels without hemodynamically significant stenosis. Irregular soft plaque within the mid right subclavian artery with resultant severe stenosis (series 8, image 282). Right carotid system: Right common carotid artery patent from its origin to the bifurcation without stenosis. Eccentric calcified plaque about the right bifurcation/proximal right ICA with associated stenosis of up to 60% by NASCET criteria. Right ICA irregular but otherwise patent to the skull base without stenosis, dissection, or occlusion. Left carotid  system: Atheromatous plaque at the origin of the left common carotid artery with mild stenosis. Left common carotid artery irregular but otherwise patent to the bifurcation without significant narrowing. Calcified plaque at the left bifurcation with associated stenosis of approximately 50% by NASCET criteria. Eccentric plaque distally within the mid and distal left ICA without high-grade stenosis. Left ICA patent to the skull base without dissection or occlusion. Vertebral arteries: Both of the vertebral arteries arise from the subclavian arteries. Right vertebral artery dominant with a diffusely hypoplastic left vertebral artery. Next plaque at the proximal right V1 segment with short-segment severe stenosis (series 9, image 130). Dominant right vertebral artery otherwise patent to the skull base. Hypoplastic left vertebral artery is markedly irregular and attenuated with scant flow, essentially including by the V3 segment. Skeleton: No acute osseous abnormality. No worrisome lytic or blastic osseous lesions. Moderate cervical spondylolysis at C4-5 through C6-7. Other neck: No acute soft tissue abnormality within the neck. No adenopathy. Salivary glands and thyroid gland within normal limits. Upper chest: Visualized upper esophagus patulous with layering fluid within the esophageal lumen. Visualized upper chest and mediastinum demonstrate no other acute abnormality. Partially visualized lungs are grossly clear. Review of the MIP images confirms the above findings CTA HEAD FINDINGS Anterior circulation: Features left ICA patent. Extensive calcified atheromatous plaque throughout the cavernous and supraclinoid segments. Resultant moderate multifocal narrowing with a small more severe stenosis at the supraclinoid left ICA (series 8, image 106). ICA terminus narrowed but patent distally. Left M1 irregular but patent without high-grade stenosis. Focal moderate stenosis at the proximal left M2 superior division (series  10, image 134). Distally, there is occlusion of a left M3 branch (series 10, image 146), likely embolic. Distal left MCA branches otherwise perfused with demonstrate atheromatous irregularity. Petrous right ICA patent. Extensive atheromatous plaque throughout the cavernous/supraclinoid right ICA with moderate to severe multifocal narrowing. Right ICA terminus patent. Right M1 irregular but patent. Right MCA bifurcates early with focal moderate to severe stenoses at bifurcation. No proximal right M2 occlusion. Distal MCA branches patent with demonstrate multifocal atheromatous irregularity. A1 segments patent bilaterally. Normal anterior communicating artery. Multifocal moderate left A2 stenoses. Right A2 diminutive with multifocal/tandem severe stenoses. Posterior circulation: Left vertebral artery essentially occluded at the skull base. Extensive atheromatous irregularity throughout the right V4 segment with moderate to severe multifocal stenoses. Right PICA patent proximally. Left PICA not visualized. Basilar overall diminutive with moderate diffuse narrowing. Superior cerebral arteries patent bilaterally. Both of the PCAs primarily supplied via the basilar. PCAs demonstrate extensive multifocal atheromatous irregularity but are patent to their distal aspects. Venous sinuses: Not well assessed due to arterial timing of the contrast bolus. Anatomic variants: None significant. Delayed phase: Not performed. Review of the MIP images confirms the above findings IMPRESSION: 1. Distal left M3 occlusion, likely embolic in nature. 2. Atheromatous stenoses about the carotid bifurcations with narrowing  of up to 60% on the right and 50% on the left. 3. Severe extensive atheromatous change throughout the intracranial circulation, with most notable findings including severe left supraclinoid stenosis, multifocal severe right A2 stenoses, with extensive multifocal severe right V4 stenoses. Hypoplastic left vertebral artery  occludes at the V3 segment. 4. Short-segment severe proximal right V1 stenosis. 5. Severe near occlusive stenosis of the mid right subclavian artery. Results discussed telephone at the time of interpretation on 05/19/2018 at approximately 3:00 am to Dr. Milon DikesASHISH ARORA. Electronically Signed   By: Rise MuBenjamin  McClintock M.D.   On: 05/19/2018 04:10   Ct Angio Neck W Or Wo Contrast  Result Date: 05/19/2018 CLINICAL DATA:  Initial evaluation for acute aphasia. EXAM: CT ANGIOGRAPHY HEAD AND NECK TECHNIQUE: Multidetector CT imaging of the head and neck was performed using the standard protocol during bolus administration of intravenous contrast. Multiplanar CT image reconstructions and MIPs were obtained to evaluate the vascular anatomy. Carotid stenosis measurements (when applicable) are obtained utilizing NASCET criteria, using the distal internal carotid diameter as the denominator. CONTRAST:  50mL ISOVUE-370 IOPAMIDOL (ISOVUE-370) INJECTION 76% COMPARISON:  None. FINDINGS: CTA NECK FINDINGS Aortic arch: Visualized aortic arch of normal caliber with normal 3 vessel morphology. Moderate atheromatous change about the arch and origin of the great vessels without hemodynamically significant stenosis. Irregular soft plaque within the mid right subclavian artery with resultant severe stenosis (series 8, image 282). Right carotid system: Right common carotid artery patent from its origin to the bifurcation without stenosis. Eccentric calcified plaque about the right bifurcation/proximal right ICA with associated stenosis of up to 60% by NASCET criteria. Right ICA irregular but otherwise patent to the skull base without stenosis, dissection, or occlusion. Left carotid system: Atheromatous plaque at the origin of the left common carotid artery with mild stenosis. Left common carotid artery irregular but otherwise patent to the bifurcation without significant narrowing. Calcified plaque at the left bifurcation with associated  stenosis of approximately 50% by NASCET criteria. Eccentric plaque distally within the mid and distal left ICA without high-grade stenosis. Left ICA patent to the skull base without dissection or occlusion. Vertebral arteries: Both of the vertebral arteries arise from the subclavian arteries. Right vertebral artery dominant with a diffusely hypoplastic left vertebral artery. Next plaque at the proximal right V1 segment with short-segment severe stenosis (series 9, image 130). Dominant right vertebral artery otherwise patent to the skull base. Hypoplastic left vertebral artery is markedly irregular and attenuated with scant flow, essentially including by the V3 segment. Skeleton: No acute osseous abnormality. No worrisome lytic or blastic osseous lesions. Moderate cervical spondylolysis at C4-5 through C6-7. Other neck: No acute soft tissue abnormality within the neck. No adenopathy. Salivary glands and thyroid gland within normal limits. Upper chest: Visualized upper esophagus patulous with layering fluid within the esophageal lumen. Visualized upper chest and mediastinum demonstrate no other acute abnormality. Partially visualized lungs are grossly clear. Review of the MIP images confirms the above findings CTA HEAD FINDINGS Anterior circulation: Features left ICA patent. Extensive calcified atheromatous plaque throughout the cavernous and supraclinoid segments. Resultant moderate multifocal narrowing with a small more severe stenosis at the supraclinoid left ICA (series 8, image 106). ICA terminus narrowed but patent distally. Left M1 irregular but patent without high-grade stenosis. Focal moderate stenosis at the proximal left M2 superior division (series 10, image 134). Distally, there is occlusion of a left M3 branch (series 10, image 146), likely embolic. Distal left MCA branches otherwise perfused with demonstrate atheromatous irregularity. Petrous  right ICA patent. Extensive atheromatous plaque throughout  the cavernous/supraclinoid right ICA with moderate to severe multifocal narrowing. Right ICA terminus patent. Right M1 irregular but patent. Right MCA bifurcates early with focal moderate to severe stenoses at bifurcation. No proximal right M2 occlusion. Distal MCA branches patent with demonstrate multifocal atheromatous irregularity. A1 segments patent bilaterally. Normal anterior communicating artery. Multifocal moderate left A2 stenoses. Right A2 diminutive with multifocal/tandem severe stenoses. Posterior circulation: Left vertebral artery essentially occluded at the skull base. Extensive atheromatous irregularity throughout the right V4 segment with moderate to severe multifocal stenoses. Right PICA patent proximally. Left PICA not visualized. Basilar overall diminutive with moderate diffuse narrowing. Superior cerebral arteries patent bilaterally. Both of the PCAs primarily supplied via the basilar. PCAs demonstrate extensive multifocal atheromatous irregularity but are patent to their distal aspects. Venous sinuses: Not well assessed due to arterial timing of the contrast bolus. Anatomic variants: None significant. Delayed phase: Not performed. Review of the MIP images confirms the above findings IMPRESSION: 1. Distal left M3 occlusion, likely embolic in nature. 2. Atheromatous stenoses about the carotid bifurcations with narrowing of up to 60% on the right and 50% on the left. 3. Severe extensive atheromatous change throughout the intracranial circulation, with most notable findings including severe left supraclinoid stenosis, multifocal severe right A2 stenoses, with extensive multifocal severe right V4 stenoses. Hypoplastic left vertebral artery occludes at the V3 segment. 4. Short-segment severe proximal right V1 stenosis. 5. Severe near occlusive stenosis of the mid right subclavian artery. Results discussed telephone at the time of interpretation on 05/19/2018 at approximately 3:00 am to Dr. Milon Dikes. Electronically Signed   By: Rise Mu M.D.   On: 05/19/2018 04:10   Ct Head Code Stroke Wo Contrast  Result Date: 05/19/2018 CLINICAL DATA:  Code stroke.  Initial evaluation for acute aphasia. EXAM: CT HEAD WITHOUT CONTRAST TECHNIQUE: Contiguous axial images were obtained from the base of the skull through the vertex without intravenous contrast. COMPARISON:  Prior CT from 09/25/2017. FINDINGS: Brain: Age-related cerebral atrophy with chronic microvascular ischemic disease. No acute intracranial hemorrhage. No acute large vessel territory infarct. No mass lesion, midline shift or mass effect. No hydrocephalus. No extra-axial fluid collection. Vascular: No hyperdense vessel. Calcified atherosclerosis at the skull base. Skull: Scalp soft tissues demonstrate no acute abnormality. Calvarium intact. Sinuses/Orbits: Globes and orbital soft tissues demonstrate no acute finding. Chronic right sphenoid sinusitis noted. Small right mastoid effusion. Other: None. ASPECTS [facility] Stroke Program Early CT Score) - Ganglionic level infarction (caudate, lentiform nuclei, internal capsule, insula, M1-M3 cortex): 7 - Supraganglionic infarction (M4-M6 cortex): 3 Total score (0-10 with 10 being normal): 10 IMPRESSION: 1. No acute intracranial infarct or other abnormality identified. 2. ASPECTS is 10. 3. Age-related cerebral atrophy with chronic small vessel ischemic disease, stable. These results were communicated to Wil[place] at 2:57 amon 6/25/2019by text page via the [facility] messaging system. Electronically Signed   By: Rise Mu M.D.   On: 05/19/2018 02:58    PHYSICAL EXAM   Pleasant obese middle aged caucasian lady not in distress. . Afebrile. Head is nontraumatic. Neck is supple without bruit.    Cardiac exam no murmur or gallop. Lungs are clear to auscultation. Distal pulses are well felt. Neurological Exam ;  Awake  Alert oriented x 3. Mildly hesitant speech  Dysarthria.eye movements full  without nystagmus.fundi were not visualized. Vision acuity and fields appear normal. Hearing is normal. Palatal movements are normal. Face asymmetric with right lower face weakness.. Tongue midline. Normal strength,  tone, reflexes and coordination. Normal sensation. Gait deferred.  ASSESSMENT/PLAN Ms. MADELYNNE LASKER is a 77 y.o. female with history of Bell's palsy with right facial weakness, coronary artery disease, hypertension, hypersomnia, diabetes, hyperlipidemia, narcotic overuse presenting with word finding difficulty, slurred speech and confusion..   Stroke:  Likely left brain infarct given M3 occlusion,   likely vessel to vessel atheroemboli from large vessel atherosclerosis  Code Stroke CT head No acute stroke. Small vessel disease. Atrophy. ASPECTS 10.     CTA head & neck distal M3 occlusion. R ICA 60%, L ICA 50%. Severe IC atherosclerosis: L ICA, R A2, R V4, L V3, R V1, mid R subclavian.   MRI  Not done as patient has some metal around eye 2D Echo  Left ventricle: The cavity size was normal. Wall thickness was   increased in a pattern of mild LVH. Systolic function was normal.   The estimated ejection fraction was in the range of 60% to 65%.   Wall motion was normal; there were no regional wall motion    abnormalities   LDL 188  HgbA1c 9.3  Heparin 5000 units sq tid for VTE prophylaxis  aspirin 81 mg daily prior to admission, now on aspirin 81 mg daily and clopidogrel 75 mg daily. Given mild stroke, continue aspirin 81 mg and plavix 75 mg daily x 3 weeks, then plavix alone.   Therapy recommendations:  pending   Disposition:  pending   Hypertension  Stable . Permissive hypertension (OK if < 220/120) but gradually normalize in 5-7 days . Long-term BP goal normotensive  Hyperlipidemia  Home meds:  No statin  LDL 188, goal < 70  Now on lipitor 40  Continue statin at discharge  Diabetes type II  HgbA1c 9.3, goal < 7.0  Other Stroke Risk Factors  Advanced  age  Hx narcotic addition. Urine drug screen positive for opiates, benzodiazepines, amphetamines  Obesity, Body mass index is 31.59 kg/m., recommend weight loss, diet and exercise as appropriate   Coronary artery disease s/p angioplasty x 9 ([place]itor[facility])  Other Active Problems  Acute on chronic somnolence,  sleep issues, Hx Malachi Carl (followed by Dr. Vickey Huger as an outpatient)  Chronic back pain, narcotic addiction  Hx Bell's Palsy with resultant right facial weakness  Hospital day # 0  Annie Main, MSN, APRN, ANVP-BC, AGPCNP-BC Advanced Practice Stroke Nurse [facility] See Amion for Schedule & Pager information 05/19/2018 1:49 PM  I have personally examined this patient, reviewed notes, independently viewed imaging studies, participated in medical decision making and plan of care.ROS completed by me personally and pertinent positives fully documented  I have made any additions or clarifications directly to the above note. Agree with note above.she presented with speech difficulties likely due to left MCA branch infarct etiology likely vessel to vessel acute emboli from large vessel atherosclerosis. MRI not safe to obtain as patient states she has some mental the eyes. Continue ongoing stroke workup. Recommend dual antiplatelet therapy of aspirin and Plavix for 3 months and aggressive risk factor modification. Long discussion with patient and husband at the bedside and answered questions. Discussed with Dr. Thedore Mins. Greater than 50% time during this 35 minute visit was spent on counseling and coordination of care of her for stroke and answering questions  Delia Heady, MD Medical Director [facility] Pager: [phone] 05/19/2018 4:07 PM  To contact Stroke Continuity provider, please refer to [email or web address]. After hours, contact General Neurology

## 2018-05-19 NOTE — ED Triage Notes (Signed)
Pt BIB [facility] with c/o new onset slurred speech and expressive aphasia. LSN 0001 tonight. Pt from home, lives with husband, hx Bell's Pa;sy, right sided facial droop normal for her. Answering questions appropriately, no weakness noted at this time.

## 2018-05-19 NOTE — Progress Notes (Signed)
Pt have an order for MRI of brain, was reported to that MRI was cancelled due to metal fragment on pt eye, MRI staff said pt was cleared to have the test but pt refused saying she was categorically told never to have an MRI by her neurologist, MRI staff advised to paged on call to order XR Orbit foreign body to clarify object, NP Maren ReamerKaren Kirby paged to be notified, yet to call back and no order yet placed. However pt said she is not comfortable doing an MRI, was however reassured and promised to keep her informed, will continue to monitor. Ob[place]-Asidi, Avel Ogawa Efe

## 2018-05-19 NOTE — ED Provider Notes (Signed)
[facility] Provider Note  CSN: [id number] Arrival date & time: 05/19/18 7902  Chief Complaint(s) Code Stroke  HPI [name] is a 77 y.o. female with extensive past medical history listed below presents to the emergency department for acute expressive aphasia and confusion..  Last known normal was midnight.  No focal deficits.  No recent trauma.  No recent fevers or infections.  She denies any chest pain or shortness of breath.  No abdominal pain.  No urinary symptoms.  Patient is on several sedating medications but no recent changes.  EMS was called and activated code stroke.  HPI  Past Medical History Past Medical History:  Diagnosis Date  . Abdominal discomfort    "due to medication intolerance"  . CAD (coronary artery disease)    previous stent  . Chronic pain syndrome 09/10/2013   Dr Nelva Bush,   . [place] Patient virus infection 1988  . Excessive daytime sleepiness 12/05/2014   Patient has not been seen for a sleep study as ordered, and used adderall to keep alert in daytime.  She agreed  In a contract not to have any scheduled medication for pain treatment from [facility] and will not receive refills for Adderall, which was initiated by dr Orland Penman, PCP>   . Fall   . Hyperlipemia   . Hypersomnia, persistent 09/10/2013   Patient on  Stimulants .  Marland Kitchen Hypertension   . Narcotic addiction ([place]) 12/05/2014  . Obesity, unspecified 09/10/2013  . Shoulder joint pain    both shoulders   Patient Active Problem List   Diagnosis Date Noted  . Acute CVA (cerebrovascular accident) ([place]) 05/19/2018  . Dehydration 03/17/2018  . Generalized weakness 03/17/2018  . Near syncope 03/17/2018  . ARF (acute renal failure) ([place]) 03/16/2018  . Hyperglycemia 09/25/2017  . Type 2 diabetes mellitus without complication, with long-term current use of insulin ([place]) 04/24/2017  . ADD (attention deficit disorder) 12/30/2016  . Narcotic addiction ([place]) 12/05/2014  . Excessive daytime  sleepiness 12/05/2014  . Alteration in psychomotor activity 12/05/2014  . Depression with somatization 12/05/2014  . Hypersomnia, persistent 09/10/2013  . Obesity, unspecified 09/10/2013  . Chronic pain syndrome 09/10/2013  . Depression 07/31/2013  . Medication intolerance 07/19/2013  . Anxiety 07/19/2013  . CAD (coronary artery disease) status post stent to distal right coronary artery 2006 06/20/2013  . Neurocardiogenic syncope 06/20/2013  . Essential hypertension 06/20/2013  . Hyperlipidemia 06/20/2013   Home Medication(s) Prior to Admission medications   Medication Sig Start Date End Date Taking? Authorizing Provider  ACCU-CHEK SOFTCLIX LANCETS lancets USE TO TEST FOUR TIMES DAILY 04/29/18   McVey, Gelene Mink, PA-C  Alfalfa 250 MG TABS Take 1 tablet by mouth every morning.    [provider]  amphetamine-dextroamphetamine (ADDERALL) 20 MG tablet Take 1 tablet (20 mg total) by mouth 2 (two) times daily as needed. Patient taking differently: Take 20 mg by mouth 2 (two) times daily as needed (chronic fatigue syndrome).  02/27/18 03/29/18  McVey, Gelene Mink, PA-C  aspirin 81 MG tablet Take 81 mg by mouth daily.    [provider]  atenolol (TENORMIN) 25 MG tablet TAKE 1 TABLET BY MOUTH IN THE MORNING AND 2 IN THE EVENING 04/07/18   McVey, Gelene Mink, PA-C  blood glucose meter kit and supplies Dispense based on patient and insurance preference. Use up to four times daily as directed. (FOR ICD-9 250.00, 250.01). 09/27/17   Oswald Hillock, MD  Cholecalciferol (VITAMIN D3) 5000 units TBDP Take  1 capsule by mouth daily.    [provider]  CHROMIUM ASPARTATE PO Take 1 tablet by mouth every morning.    [provider]  Cinnamon Bark POWD Take 1 Dose by mouth daily.    [provider]  DHA-EPA-Coenzyme Q10-Vitamin E (GNP COQ-10 & FISH OIL) 120-180-50-30 CAPS Take 3 tablets by mouth daily.     [provider]  diazepam (VALIUM) 5  MG tablet Take 5 mg by mouth every 12 (twelve) hours as needed for anxiety.     [provider]  diazepam (VALIUM) 5 MG tablet TAKE 1 TABLET BY MOUTH EVERY 12 HOURS AS NEEDED FOR ANXIETY 04/07/18   McVey, Gelene Mink, PA-C  diclofenac sodium (VOLTAREN) 1 % GEL Apply 4 g topically 4 (four) times daily. Patient not taking: Reported on 04/22/2018 01/09/18   Duffy Bruce, MD  fexofenadine (ALLEGRA) 180 MG tablet Take 180 mg by mouth daily as needed for allergies.     [provider]  glucose 4 GM chewable tablet Chew 1 tablet by mouth as needed for low blood sugar.    [provider]  glucose blood (ACCU-CHEK AVIVA PLUS) test strip USE TO TEST FOUR TIMES DAILY 12/02/17   McVey, Gelene Mink, PA-C  hydrochlorothiazide (HYDRODIURIL) 25 MG tablet Take 25 mg by mouth daily.    [provider]  HYDROcodone-acetaminophen (NORCO) 10-325 MG per tablet Take 1-2 tablets by mouth every 4 (four) hours as needed for moderate pain.  09/28/14   [provider]  Insulin Glargine (LANTUS SOLOSTAR) 100 UNIT/ML Solostar Pen Inject 24 Units into the skin daily at 10 pm. 10/24/17   McVey, Gelene Mink, PA-C  Insulin Syringe-Needle U-100 (INSULIN SYRINGE .5CC/31GX5/16") 31G X 5/16" 0.5 ML MISC Use as directed 09/27/17   Oswald Hillock, MD  levothyroxine (SYNTHROID, LEVOTHROID) 50 MCG tablet Take 1 tablet (50 mcg total) by mouth daily before breakfast. 03/07/17   McVey, Gelene Mink, PA-C  lisdexamfetamine (VYVANSE) 30 MG capsule Take 1 capsule (30 mg total) by mouth daily. 10/28/17   McVey, Gelene Mink, PA-C  lisinopril (PRINIVIL,ZESTRIL) 20 MG tablet TAKE 1 TABLET BY MOUTH IN THE MORNING AND 1/2 (ONE-HALF) AT BEDTIME 08/02/17   McVey, Gelene Mink, PA-C  magnesium oxide (MAG-OX) 400 MG tablet Take 400 mg by mouth 2 (two) times daily.    [provider]  metFORMIN (GLUCOPHAGE) 500 MG tablet Take 1 tablet (500 mg total) by mouth 2 (two) times daily  with a meal. Wk1: 1/2 tab 2x/day; Wk2: 1 tab am, 1/2 tab pm; Wk3: 2 tabs 2x/day. 09/27/17   Oswald Hillock, MD  methocarbamol (ROBAXIN) 500 MG tablet Take 1 tablet (500 mg total) by mouth 2 (two) times daily as needed for muscle spasms. Patient not taking: Reported on 04/22/2018 01/09/18   Duffy Bruce, MD  Misc. Devices (HUGO ROLLING WALKER ELITE) MISC 1 Units by Does not apply route daily as needed. 11/29/16   McVey, Gelene Mink, PA-C  triamcinolone (KENALOG) 0.025 % ointment Apply 1 application topically 2 (two) times daily. 12/02/17   McVey, Gelene Mink, PA-C  venlafaxine (EFFEXOR) 100 MG tablet TAKE 1 TABLET BY MOUTH ONCE DAILY 05/06/18   McVey, Gelene Mink, PA-C  Past Surgical History Past Surgical History:  Procedure Laterality Date  . ANGIOPLASTY  03/01/2002   cutting balloon mid RCA  . BACK SURGERY    . CARDIAC CATHETERIZATION  01/26/2007   mild diffuse CAD  . CARDIAC CATHETERIZATION  10/23/2009   nonobstructive CAD,60% prox RCA,50% prox LAD, 50% mid ramus  . CORONARY STENT PLACEMENT  03/15/2005   distal RCA  . NEPHRECTOMY     Family History Family History  Problem Relation Age of Onset  . Hypertension Mother   . Diabetes Father   . Heart attack Father   . Heart attack Brother     Social History Social History   Tobacco Use  . Smoking status: Never Smoker  . Smokeless tobacco: Never Used  Substance Use Topics  . Alcohol use: No  . Drug use: No   Allergies Azithromycin; Cardizem [diltiazem hcl]; Codeine; Coreg [carvedilol]; Demerol [meperidine]; Erythromycin; Inderal [propranolol]; Procardia [nifedipine]; Wellbutrin [bupropion]; and Zoloft [sertraline hcl]  Review of Systems Review of Systems All other systems are reviewed and are negative for acute change except as noted in the HPI  Physical Exam Vital Signs  I have reviewed  the triage vital signs BP (!) 157/75   Pulse 74   Temp (!) 97.4 F (36.3 C) (Oral)   Resp 14   Wt 80.9 kg (178 lb 5.6 oz)   SpO2 97%   BMI 31.59 kg/m   Physical Exam  Constitutional: She is oriented to person, place, and time. She appears well-developed and well-nourished. No distress.  HENT:  Head: Normocephalic and atraumatic.  Nose: Nose normal.  Patch of her right eye.  Right Bell's palsy.  Eyes: Pupils are equal, round, and reactive to light. Conjunctivae and EOM are normal. Right eye exhibits no discharge. Left eye exhibits no discharge. No scleral icterus.  Neck: Normal range of motion. Neck supple.  Cardiovascular: Normal rate and regular rhythm. Exam reveals no gallop and no friction rub.  No murmur heard. Pulmonary/Chest: Effort normal and breath sounds normal. No stridor. No respiratory distress. She has no rales.  Abdominal: Soft. She exhibits no distension. There is no tenderness.  Musculoskeletal: She exhibits no edema or tenderness.  Neurological: She is alert and oriented to person, place, and time.  Right facial droop consistent with Bell's palsy.  Word finding difficulty.  Rest of the exam defer to neurology    Skin: Skin is warm and dry. No rash noted. She is not diaphoretic. No erythema.  Psychiatric: She has a normal mood and affect.  Vitals reviewed.   ED Results and Treatments Labs (all labs ordered are listed, but only abnormal results are displayed) Labs Reviewed  COMPREHENSIVE METABOLIC PANEL - Abnormal; Notable for the following components:      Result Value   Sodium 132 (*)    Potassium 3.2 (*)    Chloride 95 (*)    Glucose, Bld 249 (*)    BUN 35 (*)    Creatinine, Ser 1.09 (*)    Total Protein 6.4 (*)    GFR calc non Af Amer 47 (*)    GFR calc Af Amer 55 (*)    All other components within normal limits  RAPID URINE DRUG SCREEN, HOSP PERFORMED - Abnormal; Notable for the following components:   Opiates POSITIVE (*)    Benzodiazepines  POSITIVE (*)    Amphetamines POSITIVE (*)    Barbiturates   (*)    Value: Result not available. Reagent lot number recalled by manufacturer.   All  other components within normal limits  URINALYSIS, ROUTINE W REFLEX MICROSCOPIC - Abnormal; Notable for the following components:   Color, Urine STRAW (*)    Glucose, UA 50 (*)    Leukocytes, UA LARGE (*)    Bacteria, UA RARE (*)    All other components within normal limits  CBG MONITORING, ED - Abnormal; Notable for the following components:   Glucose-Capillary 241 (*)    All other components within normal limits  I-STAT CHEM 8, ED - Abnormal; Notable for the following components:   Sodium 133 (*)    Potassium 3.2 (*)    Chloride 95 (*)    BUN 34 (*)    Glucose, Bld 246 (*)    Calcium, Ion 1.09 (*)    All other components within normal limits  URINE CULTURE  ETHANOL  PROTIME-INR  APTT  CBC  DIFFERENTIAL  I-STAT TROPONIN, ED                                                                                                                         EKG  EKG Interpretation  Date/Time:  Tuesday May 19 2018 03:21:00 EDT Ventricular Rate:  77 PR Interval:    QRS Duration: 112 QT Interval:  462 QTC Calculation: 523 R Axis:   76 Text Interpretation:  Sinus rhythm Prolonged PR interval Low voltage, precordial leads Anteroseptal infarct, old Prolonged QT interval No significant change since last tracing Confirmed by Addison Lank [phone]) on 05/19/2018 6:09:55 AM      Radiology Ct Angio Head W Or Wo Contrast  Result Date: 05/19/2018 CLINICAL DATA:  Initial evaluation for acute aphasia. EXAM: CT ANGIOGRAPHY HEAD AND NECK TECHNIQUE: Multidetector CT imaging of the head and neck was performed using the standard protocol during bolus administration of intravenous contrast. Multiplanar CT image reconstructions and MIPs were obtained to evaluate the vascular anatomy. Carotid stenosis measurements (when applicable) are obtained utilizing NASCET  criteria, using the distal internal carotid diameter as the denominator. CONTRAST:  44m ISOVUE-370 IOPAMIDOL (ISOVUE-370) INJECTION 76% COMPARISON:  None. FINDINGS: CTA NECK FINDINGS Aortic arch: Visualized aortic arch of normal caliber with normal 3 vessel morphology. Moderate atheromatous change about the arch and origin of the great vessels without hemodynamically significant stenosis. Irregular soft plaque within the mid right subclavian artery with resultant severe stenosis (series 8, image 282). Right carotid system: Right common carotid artery patent from its origin to the bifurcation without stenosis. Eccentric calcified plaque about the right bifurcation/proximal right ICA with associated stenosis of up to 60% by NASCET criteria. Right ICA irregular but otherwise patent to the skull base without stenosis, dissection, or occlusion. Left carotid system: Atheromatous plaque at the origin of the left common carotid artery with mild stenosis. Left common carotid artery irregular but otherwise patent to the bifurcation without significant narrowing. Calcified plaque at the left bifurcation with associated stenosis of approximately 50% by NASCET criteria. Eccentric plaque distally within the mid and distal left ICA without high-grade stenosis. Left  ICA patent to the skull base without dissection or occlusion. Vertebral arteries: Both of the vertebral arteries arise from the subclavian arteries. Right vertebral artery dominant with a diffusely hypoplastic left vertebral artery. Next plaque at the proximal right V1 segment with short-segment severe stenosis (series 9, image 130). Dominant right vertebral artery otherwise patent to the skull base. Hypoplastic left vertebral artery is markedly irregular and attenuated with scant flow, essentially including by the V3 segment. Skeleton: No acute osseous abnormality. No worrisome lytic or blastic osseous lesions. Moderate cervical spondylolysis at C4-5 through C6-7.  Other neck: No acute soft tissue abnormality within the neck. No adenopathy. Salivary glands and thyroid gland within normal limits. Upper chest: Visualized upper esophagus patulous with layering fluid within the esophageal lumen. Visualized upper chest and mediastinum demonstrate no other acute abnormality. Partially visualized lungs are grossly clear. Review of the MIP images confirms the above findings CTA HEAD FINDINGS Anterior circulation: Features left ICA patent. Extensive calcified atheromatous plaque throughout the cavernous and supraclinoid segments. Resultant moderate multifocal narrowing with a small more severe stenosis at the supraclinoid left ICA (series 8, image 106). ICA terminus narrowed but patent distally. Left M1 irregular but patent without high-grade stenosis. Focal moderate stenosis at the proximal left M2 superior division (series 10, image 134). Distally, there is occlusion of a left M3 branch (series 10, image 628), likely embolic. Distal left MCA branches otherwise perfused with demonstrate atheromatous irregularity. Petrous right ICA patent. Extensive atheromatous plaque throughout the cavernous/supraclinoid right ICA with moderate to severe multifocal narrowing. Right ICA terminus patent. Right M1 irregular but patent. Right MCA bifurcates early with focal moderate to severe stenoses at bifurcation. No proximal right M2 occlusion. Distal MCA branches patent with demonstrate multifocal atheromatous irregularity. A1 segments patent bilaterally. Normal anterior communicating artery. Multifocal moderate left A2 stenoses. Right A2 diminutive with multifocal/tandem severe stenoses. Posterior circulation: Left vertebral artery essentially occluded at the skull base. Extensive atheromatous irregularity throughout the right V4 segment with moderate to severe multifocal stenoses. Right PICA patent proximally. Left PICA not visualized. Basilar overall diminutive with moderate diffuse narrowing.  Superior cerebral arteries patent bilaterally. Both of the PCAs primarily supplied via the basilar. PCAs demonstrate extensive multifocal atheromatous irregularity but are patent to their distal aspects. Venous sinuses: Not well assessed due to arterial timing of the contrast bolus. Anatomic variants: None significant. Delayed phase: Not performed. Review of the MIP images confirms the above findings IMPRESSION: 1. Distal left M3 occlusion, likely embolic in nature. 2. Atheromatous stenoses about the carotid bifurcations with narrowing of up to 60% on the right and 50% on the left. 3. Severe extensive atheromatous change throughout the intracranial circulation, with most notable findings including severe left supraclinoid stenosis, multifocal severe right A2 stenoses, with extensive multifocal severe right V4 stenoses. Hypoplastic left vertebral artery occludes at the V3 segment. 4. Short-segment severe proximal right V1 stenosis. 5. Severe near occlusive stenosis of the mid right subclavian artery. Results discussed telephone at the time of interpretation on 05/19/2018 at approximately 3:00 am to Dr. Amie Portland. Electronically Signed   By: Jeannine Boga M.D.   On: 05/19/2018 04:10   Ct Angio Neck W Or Wo Contrast  Result Date: 05/19/2018 CLINICAL DATA:  Initial evaluation for acute aphasia. EXAM: CT ANGIOGRAPHY HEAD AND NECK TECHNIQUE: Multidetector CT imaging of the head and neck was performed using the standard protocol during bolus administration of intravenous contrast. Multiplanar CT image reconstructions and MIPs were obtained to evaluate the vascular anatomy. Carotid stenosis  measurements (when applicable) are obtained utilizing NASCET criteria, using the distal internal carotid diameter as the denominator. CONTRAST:  22m ISOVUE-370 IOPAMIDOL (ISOVUE-370) INJECTION 76% COMPARISON:  None. FINDINGS: CTA NECK FINDINGS Aortic arch: Visualized aortic arch of normal caliber with normal 3 vessel  morphology. Moderate atheromatous change about the arch and origin of the great vessels without hemodynamically significant stenosis. Irregular soft plaque within the mid right subclavian artery with resultant severe stenosis (series 8, image 282). Right carotid system: Right common carotid artery patent from its origin to the bifurcation without stenosis. Eccentric calcified plaque about the right bifurcation/proximal right ICA with associated stenosis of up to 60% by NASCET criteria. Right ICA irregular but otherwise patent to the skull base without stenosis, dissection, or occlusion. Left carotid system: Atheromatous plaque at the origin of the left common carotid artery with mild stenosis. Left common carotid artery irregular but otherwise patent to the bifurcation without significant narrowing. Calcified plaque at the left bifurcation with associated stenosis of approximately 50% by NASCET criteria. Eccentric plaque distally within the mid and distal left ICA without high-grade stenosis. Left ICA patent to the skull base without dissection or occlusion. Vertebral arteries: Both of the vertebral arteries arise from the subclavian arteries. Right vertebral artery dominant with a diffusely hypoplastic left vertebral artery. Next plaque at the proximal right V1 segment with short-segment severe stenosis (series 9, image 130). Dominant right vertebral artery otherwise patent to the skull base. Hypoplastic left vertebral artery is markedly irregular and attenuated with scant flow, essentially including by the V3 segment. Skeleton: No acute osseous abnormality. No worrisome lytic or blastic osseous lesions. Moderate cervical spondylolysis at C4-5 through C6-7. Other neck: No acute soft tissue abnormality within the neck. No adenopathy. Salivary glands and thyroid gland within normal limits. Upper chest: Visualized upper esophagus patulous with layering fluid within the esophageal lumen. Visualized upper chest and  mediastinum demonstrate no other acute abnormality. Partially visualized lungs are grossly clear. Review of the MIP images confirms the above findings CTA HEAD FINDINGS Anterior circulation: Features left ICA patent. Extensive calcified atheromatous plaque throughout the cavernous and supraclinoid segments. Resultant moderate multifocal narrowing with a small more severe stenosis at the supraclinoid left ICA (series 8, image 106). ICA terminus narrowed but patent distally. Left M1 irregular but patent without high-grade stenosis. Focal moderate stenosis at the proximal left M2 superior division (series 10, image 134). Distally, there is occlusion of a left M3 branch (series 10, image 1509, likely embolic. Distal left MCA branches otherwise perfused with demonstrate atheromatous irregularity. Petrous right ICA patent. Extensive atheromatous plaque throughout the cavernous/supraclinoid right ICA with moderate to severe multifocal narrowing. Right ICA terminus patent. Right M1 irregular but patent. Right MCA bifurcates early with focal moderate to severe stenoses at bifurcation. No proximal right M2 occlusion. Distal MCA branches patent with demonstrate multifocal atheromatous irregularity. A1 segments patent bilaterally. Normal anterior communicating artery. Multifocal moderate left A2 stenoses. Right A2 diminutive with multifocal/tandem severe stenoses. Posterior circulation: Left vertebral artery essentially occluded at the skull base. Extensive atheromatous irregularity throughout the right V4 segment with moderate to severe multifocal stenoses. Right PICA patent proximally. Left PICA not visualized. Basilar overall diminutive with moderate diffuse narrowing. Superior cerebral arteries patent bilaterally. Both of the PCAs primarily supplied via the basilar. PCAs demonstrate extensive multifocal atheromatous irregularity but are patent to their distal aspects. Venous sinuses: Not well assessed due to arterial timing  of the contrast bolus. Anatomic variants: None significant. Delayed phase: Not performed. Review of the  MIP images confirms the above findings IMPRESSION: 1. Distal left M3 occlusion, likely embolic in nature. 2. Atheromatous stenoses about the carotid bifurcations with narrowing of up to 60% on the right and 50% on the left. 3. Severe extensive atheromatous change throughout the intracranial circulation, with most notable findings including severe left supraclinoid stenosis, multifocal severe right A2 stenoses, with extensive multifocal severe right V4 stenoses. Hypoplastic left vertebral artery occludes at the V3 segment. 4. Short-segment severe proximal right V1 stenosis. 5. Severe near occlusive stenosis of the mid right subclavian artery. Results discussed telephone at the time of interpretation on 05/19/2018 at approximately 3:00 am to Dr. Amie Portland. Electronically Signed   By: Jeannine Boga M.D.   On: 05/19/2018 04:10   Ct Head Code Stroke Wo Contrast  Result Date: 05/19/2018 CLINICAL DATA:  Code stroke.  Initial evaluation for acute aphasia. EXAM: CT HEAD WITHOUT CONTRAST TECHNIQUE: Contiguous axial images were obtained from the base of the skull through the vertex without intravenous contrast. COMPARISON:  Prior CT from 09/25/2017. FINDINGS: Brain: Age-related cerebral atrophy with chronic microvascular ischemic disease. No acute intracranial hemorrhage. No acute large vessel territory infarct. No mass lesion, midline shift or mass effect. No hydrocephalus. No extra-axial fluid collection. Vascular: No hyperdense vessel. Calcified atherosclerosis at the skull base. Skull: Scalp soft tissues demonstrate no acute abnormality. Calvarium intact. Sinuses/Orbits: Globes and orbital soft tissues demonstrate no acute finding. Chronic right sphenoid sinusitis noted. Small right mastoid effusion. Other: None. ASPECTS [facility] Stroke Program Early CT Score) - Ganglionic level infarction (caudate,  lentiform nuclei, internal capsule, insula, M1-M3 cortex): 7 - Supraganglionic infarction (M4-M6 cortex): 3 Total score (0-10 with 10 being normal): 10 IMPRESSION: 1. No acute intracranial infarct or other abnormality identified. 2. ASPECTS is 10. 3. Age-related cerebral atrophy with chronic small vessel ischemic disease, stable. These results were communicated to Rory Percy at 2:57 amon 6/25/2019by text page via the [facility] messaging system. Electronically Signed   By: Jeannine Boga M.D.   On: 05/19/2018 02:58   Pertinent labs & imaging results that were available during my care of the patient were reviewed by me and considered in my medical decision making (see chart for details).  Medications Ordered in ED Medications  iopamidol (ISOVUE-370) 76 % injection (has no administration in time range)  iopamidol (ISOVUE-370) 76 % injection 50 mL (50 mLs Intravenous Contrast Given 05/19/18 0301)                                                                                                                                    Procedures Procedures  (including critical care time)  Medical Decision Making / ED Course I have reviewed the nursing notes for this encounter and the patient's prior records (if available in EHR or on provided paperwork).    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  Labs at patient's baseline and grossly reassuring.  UA  suspicious for possible infection but patient is denying any urinary symptoms.  Will send for culture.  Patient was not a candidate for TPA per neurology.  They did however recommend admission for stroke work-up.  Patient admitted to medicine for continued work-up and management.  Final Clinical Impression(s) / ED Diagnoses Final diagnoses:  Aphasia      This chart was dictated using voice recognition software.  Despite best efforts to proofread,  errors can occur which can change the documentation meaning.   Fatima Blank, MD 05/19/18 [phone]

## 2018-05-19 NOTE — Progress Notes (Signed)
Spoke with Dr Wilford CornerArora about the MRI, said that will be taken care of tomorrow morning, same related back to pt. [name], [name]

## 2018-05-19 NOTE — H&P (Signed)
TRH H&P   Patient Demographics:    [name], is a 77 y.o. female  MRN: [id number]   DOB - September 22, 1941  Admit Date - 05/19/2018  Outpatient Primary MD for the patient is McVey, Gelene Mink, PA-C      Patient coming from: Home  Chief Complaint  Patient presents with  . Code Stroke      HPI:    [name]  is a 77 y.o. female, with history of CAD status post stent placement, DM type II, hypertension, dyslipidemia, obesity, chronic pain with narcotic addiction, anxiety and depression, right-sided Bell's palsy, assistive daytime somnolence, who was brought into the hospital when her husband noticed that she was more confused than her usual self and her speech was somewhat slurred.  Underwent CT angiogram head and neck in the ER where CT angiogram was positive for distal left M3 occlusion likely embolic, she was seen by neurologist and hospitalist team was requested to admit.  She came last night and I am seeing her this morning when she is already on the floor.  This morning upon my evaluation the patient continues to have right-sided facial droop which is consistent with her history of Bell's palsy, she is somewhat somnolent and carries a history of the same is in no distress, she denies any focal weakness at this time, currently has no subjective complaints except chronic pain which is all over and diffuse.  Full stroke work-up has been ordered.  Deficits are minimal and almost completely resolved except mild expressive aphasia where she has some word finding difficulty and which I think could be her baseline at this point.   Review of systems:    Currently negative review of systems. A full 10 point Review  of Systems was done, except as stated above, all other Review of Systems were negative.   With Past History of the following :    Past Medical History:  Diagnosis Date  . Abdominal discomfort    "due to medication intolerance"  . CAD (coronary artery disease)    previous stent  . Chronic pain syndrome 09/10/2013   Dr Nelva Bush,   . [place] Patient virus infection 1988  . Excessive daytime sleepiness 12/05/2014   Patient has not been seen for a sleep study as ordered, and used adderall to keep alert in daytime.  She agreed  In a contract not to have any scheduled medication for pain treatment from [place] and will not receive refills for Adderall, which was initiated by dr Orland Penman, PCP>   . Fall   . Hyperlipemia   . Hypersomnia, persistent 09/10/2013   Patient on  Stimulants .  Marland Kitchen Hypertension   . Narcotic addiction ([place]) 12/05/2014  . Obesity, unspecified 09/10/2013  . Shoulder joint pain    both shoulders      Past Surgical History:  Procedure Laterality Date  . ANGIOPLASTY  03/01/2002   cutting balloon mid RCA  . BACK SURGERY    . CARDIAC CATHETERIZATION  01/26/2007   mild diffuse CAD  . CARDIAC CATHETERIZATION  10/23/2009   nonobstructive CAD,60% prox RCA,50% prox LAD, 50% mid ramus  . CORONARY STENT PLACEMENT  03/15/2005   distal RCA  . NEPHRECTOMY        Social History:     Social History   Tobacco Use  . Smoking status: Never Smoker  . Smokeless tobacco: Never Used  Substance Use Topics  . Alcohol use: No         Family History :     Family History  Problem Relation Age of Onset  . Hypertension Mother   . Diabetes Father   . Heart attack Father   . Heart attack Brother        Home Medications:   Prior to Admission medications   Medication Sig Start Date End Date Taking? Authorizing Provider  ACCU-CHEK SOFTCLIX LANCETS lancets USE TO TEST FOUR TIMES DAILY 04/29/18   McVey, Gelene Mink, PA-C  Alfalfa 250 MG TABS Take 1 tablet by mouth every morning.     [provider]  amphetamine-dextroamphetamine (ADDERALL) 20 MG tablet Take 1 tablet (20 mg total) by mouth 2 (two) times daily as needed. Patient taking differently: Take 20 mg by mouth 2 (two) times daily as needed (chronic fatigue syndrome).  02/27/18 03/29/18  McVey, Gelene Mink, PA-C  aspirin 81 MG tablet Take 81 mg by mouth daily.    [provider]  atenolol (TENORMIN) 25 MG tablet TAKE 1 TABLET BY MOUTH IN THE MORNING AND 2 IN THE EVENING 04/07/18   McVey, Gelene Mink, PA-C  blood glucose meter kit and supplies Dispense based on patient and insurance preference. Use up to four times daily as directed. (FOR ICD-9 250.00, 250.01). 09/27/17   Oswald Hillock, MD  Cholecalciferol (VITAMIN D3) 5000 units TBDP Take 1 capsule by mouth daily.    [provider]  CHROMIUM ASPARTATE PO Take 1 tablet by mouth every morning.    [provider]  Cinnamon Bark POWD Take 1 Dose by mouth daily.    [provider]  DHA-EPA-Coenzyme Q10-Vitamin E (GNP COQ-10 & FISH OIL) 120-180-50-30 CAPS Take 3 tablets by mouth daily.     [provider]  diazepam (VALIUM) 5 MG tablet Take 5 mg by mouth every 12 (twelve) hours as needed for anxiety.     [provider]  diazepam (VALIUM) 5 MG tablet TAKE 1 TABLET BY MOUTH EVERY 12 HOURS AS NEEDED FOR ANXIETY 04/07/18   McVey, Gelene Mink, PA-C  diclofenac sodium (VOLTAREN) 1 % GEL Apply 4 g topically 4 (four) times daily. Patient not taking: Reported on 04/22/2018 01/09/18   Duffy Bruce, MD  fexofenadine (ALLEGRA) 180 MG tablet Take 180 mg by mouth daily as needed for allergies.     [provider]  glucose 4 GM chewable tablet Chew  1 tablet by mouth as needed for low blood sugar.    [provider]  glucose blood (ACCU-CHEK AVIVA PLUS) test strip USE TO TEST FOUR TIMES DAILY 12/02/17   McVey, Gelene Mink, PA-C  hydrochlorothiazide (HYDRODIURIL) 25 MG tablet Take 25 mg by mouth  daily.    [provider]  HYDROcodone-acetaminophen (NORCO) 10-325 MG per tablet Take 1-2 tablets by mouth every 4 (four) hours as needed for moderate pain.  09/28/14   [provider]  Insulin Glargine (LANTUS SOLOSTAR) 100 UNIT/ML Solostar Pen Inject 24 Units into the skin daily at 10 pm. 10/24/17   McVey, Gelene Mink, PA-C  Insulin Syringe-Needle U-100 (INSULIN SYRINGE .5CC/31GX5/16") 31G X 5/16" 0.5 ML MISC Use as directed 09/27/17   Oswald Hillock, MD  levothyroxine (SYNTHROID, LEVOTHROID) 50 MCG tablet Take 1 tablet (50 mcg total) by mouth daily before breakfast. 03/07/17   McVey, Gelene Mink, PA-C  lisdexamfetamine (VYVANSE) 30 MG capsule Take 1 capsule (30 mg total) by mouth daily. 10/28/17   McVey, Gelene Mink, PA-C  lisinopril (PRINIVIL,ZESTRIL) 20 MG tablet TAKE 1 TABLET BY MOUTH IN THE MORNING AND 1/2 (ONE-HALF) AT BEDTIME 08/02/17   McVey, Gelene Mink, PA-C  magnesium oxide (MAG-OX) 400 MG tablet Take 400 mg by mouth 2 (two) times daily.    [provider]  metFORMIN (GLUCOPHAGE) 500 MG tablet Take 1 tablet (500 mg total) by mouth 2 (two) times daily with a meal. Wk1: 1/2 tab 2x/day; Wk2: 1 tab am, 1/2 tab pm; Wk3: 2 tabs 2x/day. 09/27/17   Oswald Hillock, MD  methocarbamol (ROBAXIN) 500 MG tablet Take 1 tablet (500 mg total) by mouth 2 (two) times daily as needed for muscle spasms. Patient not taking: Reported on 04/22/2018 01/09/18   Duffy Bruce, MD  Misc. Devices (HUGO ROLLING WALKER ELITE) MISC 1 Units by Does not apply route daily as needed. 11/29/16   McVey, Gelene Mink, PA-C  triamcinolone (KENALOG) 0.025 % ointment Apply 1 application topically 2 (two) times daily. 12/02/17   McVey, Gelene Mink, PA-C  venlafaxine (EFFEXOR) 100 MG tablet TAKE 1 TABLET BY MOUTH ONCE DAILY 05/06/18   McVey, Gelene Mink, PA-C     Allergies:     Allergies  Allergen Reactions  . Azithromycin Nausea And Vomiting  . Cardizem [Diltiazem Hcl]  Nausea And Vomiting  . Codeine Other (See Comments)    Feeling weird   . Coreg [Carvedilol] Nausea And Vomiting  . Demerol [Meperidine] Other (See Comments)    Per pt disoriented  . Erythromycin Nausea And Vomiting    Very sick  . Inderal [Propranolol] Other (See Comments)    Made me cry  . Procardia [Nifedipine] Other (See Comments)    Disoriented, sick  . Wellbutrin [Bupropion] Other (See Comments)    Unknown reaction  . Zoloft [Sertraline Hcl] Other (See Comments)    Could not talk, stiff     Physical Exam:   Vitals  Blood pressure 96/67, pulse 70, temperature 97.8 F (36.6 C), temperature source Oral, resp. rate 16, weight 80.9 kg (178 lb 5.6 oz), SpO2 98 %.   1. General morbidly obese Caucasian female sleeping in hospital bed in no apparent distress, mild right-sided facial droop which she states is chronic due to her Bell's palsy, some difficulty in initiating speech and sentences but this could be due to her drowsiness  2. Normal affect and insight, Not Suicidal or Homicidal, oriented x3  3. No F.N deficits, ALL C.Nerves Intact, Strength 5/5 all 4  extremities, Sensation intact all 4 extremities, Plantars down going.  4. Ears and Eyes appear Normal, Conjunctivae clear, PERRLA. Moist Oral Mucosa.  5. Supple Neck, No JVD, No cervical lymphadenopathy appriciated, No Carotid Bruits.  6. Symmetrical Chest wall movement, Good air movement bilaterally, CTAB.  7. RRR, No Gallops, Rubs or Murmurs, No Parasternal Heave.  8. Positive Bowel Sounds, Abdomen Soft, No tenderness, No organomegaly appriciated,No rebound -guarding or rigidity.  9.  No Cyanosis, Normal Skin Turgor, No Skin Rash or Bruise.  10. Good muscle tone,  joints appear normal , no effusions, Normal ROM.  11. No Palpable Lymph Nodes in Neck or Axillae      Data Review:    CBC Recent Labs  Lab 05/19/18 0239 05/19/18 0242  WBC 8.6  --   HGB 12.1 12.6  HCT 38.4 37.0  PLT 274  --   MCV 84.0  --     MCH 26.5  --   MCHC 31.5  --   RDW 14.6  --   LYMPHSABS 2.1  --   MONOABS 0.9  --   EOSABS 0.2  --   BASOSABS 0.1  --    ------------------------------------------------------------------------------------------------------------------  Chemistries  Recent Labs  Lab 05/19/18 0239 05/19/18 0242  NA 132* 133*  K 3.2* 3.2*  CL 95* 95*  CO2 24  --   GLUCOSE 249* 246*  BUN 35* 34*  CREATININE 1.09* 1.00  CALCIUM 9.4  --   AST 21  --   ALT 19  --   ALKPHOS 86  --   BILITOT 0.6  --    ------------------------------------------------------------------------------------------------------------------ estimated creatinine clearance is 46.7 mL/min (by C-G formula based on SCr of 1 mg/dL). ------------------------------------------------------------------------------------------------------------------ No results for input(s): TSH, T4TOTAL, T3FREE, THYROIDAB in the last 72 hours.  Invalid input(s): FREET3  Coagulation profile Recent Labs  Lab 05/19/18 0239  INR 0.98   ------------------------------------------------------------------------------------------------------------------- No results for input(s): DDIMER in the last 72 hours. -------------------------------------------------------------------------------------------------------------------  Cardiac Enzymes No results for input(s): CKMB, TROPONINI, MYOGLOBIN in the last 168 hours.  Invalid input(s): CK ------------------------------------------------------------------------------------------------------------------ No results found for: BNP   ---------------------------------------------------------------------------------------------------------------  Urinalysis    Component Value Date/Time   COLORURINE STRAW (A) 05/19/2018 0448   APPEARANCEUR CLEAR 05/19/2018 0448   LABSPEC 1.015 05/19/2018 0448   PHURINE 5.0 05/19/2018 0448   GLUCOSEU 50 (A) 05/19/2018 0448   HGBUR NEGATIVE 05/19/2018 0448    BILIRUBINUR NEGATIVE 05/19/2018 0448   KETONESUR NEGATIVE 05/19/2018 0448   PROTEINUR NEGATIVE 05/19/2018 0448   UROBILINOGEN 0.2 07/15/2013 1819   NITRITE NEGATIVE 05/19/2018 0448   LEUKOCYTESUR LARGE (A) 05/19/2018 0448    ----------------------------------------------------------------------------------------------------------------   Imaging Results:    Ct Angio Head W Or Wo Contrast  Result Date: 05/19/2018 CLINICAL DATA:  Initial evaluation for acute aphasia. EXAM: CT ANGIOGRAPHY HEAD AND NECK TECHNIQUE: Multidetector CT imaging of the head and neck was performed using the standard protocol during bolus administration of intravenous contrast. Multiplanar CT image reconstructions and MIPs were obtained to evaluate the vascular anatomy. Carotid stenosis measurements (when applicable) are obtained utilizing NASCET criteria, using the distal internal carotid diameter as the denominator. CONTRAST:  79m ISOVUE-370 IOPAMIDOL (ISOVUE-370) INJECTION 76% COMPARISON:  None. FINDINGS: CTA NECK FINDINGS Aortic arch: Visualized aortic arch of normal caliber with normal 3 vessel morphology. Moderate atheromatous change about the arch and origin of the great vessels without hemodynamically significant stenosis. Irregular soft plaque within the mid right subclavian artery with resultant severe stenosis (series 8, image 282). Right carotid system:  Right common carotid artery patent from its origin to the bifurcation without stenosis. Eccentric calcified plaque about the right bifurcation/proximal right ICA with associated stenosis of up to 60% by NASCET criteria. Right ICA irregular but otherwise patent to the skull base without stenosis, dissection, or occlusion. Left carotid system: Atheromatous plaque at the origin of the left common carotid artery with mild stenosis. Left common carotid artery irregular but otherwise patent to the bifurcation without significant narrowing. Calcified plaque at the left  bifurcation with associated stenosis of approximately 50% by NASCET criteria. Eccentric plaque distally within the mid and distal left ICA without high-grade stenosis. Left ICA patent to the skull base without dissection or occlusion. Vertebral arteries: Both of the vertebral arteries arise from the subclavian arteries. Right vertebral artery dominant with a diffusely hypoplastic left vertebral artery. Next plaque at the proximal right V1 segment with short-segment severe stenosis (series 9, image 130). Dominant right vertebral artery otherwise patent to the skull base. Hypoplastic left vertebral artery is markedly irregular and attenuated with scant flow, essentially including by the V3 segment. Skeleton: No acute osseous abnormality. No worrisome lytic or blastic osseous lesions. Moderate cervical spondylolysis at C4-5 through C6-7. Other neck: No acute soft tissue abnormality within the neck. No adenopathy. Salivary glands and thyroid gland within normal limits. Upper chest: Visualized upper esophagus patulous with layering fluid within the esophageal lumen. Visualized upper chest and mediastinum demonstrate no other acute abnormality. Partially visualized lungs are grossly clear. Review of the MIP images confirms the above findings CTA HEAD FINDINGS Anterior circulation: Features left ICA patent. Extensive calcified atheromatous plaque throughout the cavernous and supraclinoid segments. Resultant moderate multifocal narrowing with a small more severe stenosis at the supraclinoid left ICA (series 8, image 106). ICA terminus narrowed but patent distally. Left M1 irregular but patent without high-grade stenosis. Focal moderate stenosis at the proximal left M2 superior division (series 10, image 134). Distally, there is occlusion of a left M3 branch (series 10, image 638), likely embolic. Distal left MCA branches otherwise perfused with demonstrate atheromatous irregularity. Petrous right ICA patent. Extensive  atheromatous plaque throughout the cavernous/supraclinoid right ICA with moderate to severe multifocal narrowing. Right ICA terminus patent. Right M1 irregular but patent. Right MCA bifurcates early with focal moderate to severe stenoses at bifurcation. No proximal right M2 occlusion. Distal MCA branches patent with demonstrate multifocal atheromatous irregularity. A1 segments patent bilaterally. Normal anterior communicating artery. Multifocal moderate left A2 stenoses. Right A2 diminutive with multifocal/tandem severe stenoses. Posterior circulation: Left vertebral artery essentially occluded at the skull base. Extensive atheromatous irregularity throughout the right V4 segment with moderate to severe multifocal stenoses. Right PICA patent proximally. Left PICA not visualized. Basilar overall diminutive with moderate diffuse narrowing. Superior cerebral arteries patent bilaterally. Both of the PCAs primarily supplied via the basilar. PCAs demonstrate extensive multifocal atheromatous irregularity but are patent to their distal aspects. Venous sinuses: Not well assessed due to arterial timing of the contrast bolus. Anatomic variants: None significant. Delayed phase: Not performed. Review of the MIP images confirms the above findings IMPRESSION: 1. Distal left M3 occlusion, likely embolic in nature. 2. Atheromatous stenoses about the carotid bifurcations with narrowing of up to 60% on the right and 50% on the left. 3. Severe extensive atheromatous change throughout the intracranial circulation, with most notable findings including severe left supraclinoid stenosis, multifocal severe right A2 stenoses, with extensive multifocal severe right V4 stenoses. Hypoplastic left vertebral artery occludes at the V3 segment. 4. Short-segment severe proximal right V1  stenosis. 5. Severe near occlusive stenosis of the mid right subclavian artery. Results discussed telephone at the time of interpretation on 05/19/2018 at  approximately 3:00 am to Dr. Amie Portland. Electronically Signed   By: Jeannine Boga M.D.   On: 05/19/2018 04:10   Ct Angio Neck W Or Wo Contrast  Result Date: 05/19/2018 CLINICAL DATA:  Initial evaluation for acute aphasia. EXAM: CT ANGIOGRAPHY HEAD AND NECK TECHNIQUE: Multidetector CT imaging of the head and neck was performed using the standard protocol during bolus administration of intravenous contrast. Multiplanar CT image reconstructions and MIPs were obtained to evaluate the vascular anatomy. Carotid stenosis measurements (when applicable) are obtained utilizing NASCET criteria, using the distal internal carotid diameter as the denominator. CONTRAST:  18m ISOVUE-370 IOPAMIDOL (ISOVUE-370) INJECTION 76% COMPARISON:  None. FINDINGS: CTA NECK FINDINGS Aortic arch: Visualized aortic arch of normal caliber with normal 3 vessel morphology. Moderate atheromatous change about the arch and origin of the great vessels without hemodynamically significant stenosis. Irregular soft plaque within the mid right subclavian artery with resultant severe stenosis (series 8, image 282). Right carotid system: Right common carotid artery patent from its origin to the bifurcation without stenosis. Eccentric calcified plaque about the right bifurcation/proximal right ICA with associated stenosis of up to 60% by NASCET criteria. Right ICA irregular but otherwise patent to the skull base without stenosis, dissection, or occlusion. Left carotid system: Atheromatous plaque at the origin of the left common carotid artery with mild stenosis. Left common carotid artery irregular but otherwise patent to the bifurcation without significant narrowing. Calcified plaque at the left bifurcation with associated stenosis of approximately 50% by NASCET criteria. Eccentric plaque distally within the mid and distal left ICA without high-grade stenosis. Left ICA patent to the skull base without dissection or occlusion. Vertebral arteries:  Both of the vertebral arteries arise from the subclavian arteries. Right vertebral artery dominant with a diffusely hypoplastic left vertebral artery. Next plaque at the proximal right V1 segment with short-segment severe stenosis (series 9, image 130). Dominant right vertebral artery otherwise patent to the skull base. Hypoplastic left vertebral artery is markedly irregular and attenuated with scant flow, essentially including by the V3 segment. Skeleton: No acute osseous abnormality. No worrisome lytic or blastic osseous lesions. Moderate cervical spondylolysis at C4-5 through C6-7. Other neck: No acute soft tissue abnormality within the neck. No adenopathy. Salivary glands and thyroid gland within normal limits. Upper chest: Visualized upper esophagus patulous with layering fluid within the esophageal lumen. Visualized upper chest and mediastinum demonstrate no other acute abnormality. Partially visualized lungs are grossly clear. Review of the MIP images confirms the above findings CTA HEAD FINDINGS Anterior circulation: Features left ICA patent. Extensive calcified atheromatous plaque throughout the cavernous and supraclinoid segments. Resultant moderate multifocal narrowing with a small more severe stenosis at the supraclinoid left ICA (series 8, image 106). ICA terminus narrowed but patent distally. Left M1 irregular but patent without high-grade stenosis. Focal moderate stenosis at the proximal left M2 superior division (series 10, image 134). Distally, there is occlusion of a left M3 branch (series 10, image 1650, likely embolic. Distal left MCA branches otherwise perfused with demonstrate atheromatous irregularity. Petrous right ICA patent. Extensive atheromatous plaque throughout the cavernous/supraclinoid right ICA with moderate to severe multifocal narrowing. Right ICA terminus patent. Right M1 irregular but patent. Right MCA bifurcates early with focal moderate to severe stenoses at bifurcation. No  proximal right M2 occlusion. Distal MCA branches patent with demonstrate multifocal atheromatous irregularity. A1 segments patent bilaterally.  Normal anterior communicating artery. Multifocal moderate left A2 stenoses. Right A2 diminutive with multifocal/tandem severe stenoses. Posterior circulation: Left vertebral artery essentially occluded at the skull base. Extensive atheromatous irregularity throughout the right V4 segment with moderate to severe multifocal stenoses. Right PICA patent proximally. Left PICA not visualized. Basilar overall diminutive with moderate diffuse narrowing. Superior cerebral arteries patent bilaterally. Both of the PCAs primarily supplied via the basilar. PCAs demonstrate extensive multifocal atheromatous irregularity but are patent to their distal aspects. Venous sinuses: Not well assessed due to arterial timing of the contrast bolus. Anatomic variants: None significant. Delayed phase: Not performed. Review of the MIP images confirms the above findings IMPRESSION: 1. Distal left M3 occlusion, likely embolic in nature. 2. Atheromatous stenoses about the carotid bifurcations with narrowing of up to 60% on the right and 50% on the left. 3. Severe extensive atheromatous change throughout the intracranial circulation, with most notable findings including severe left supraclinoid stenosis, multifocal severe right A2 stenoses, with extensive multifocal severe right V4 stenoses. Hypoplastic left vertebral artery occludes at the V3 segment. 4. Short-segment severe proximal right V1 stenosis. 5. Severe near occlusive stenosis of the mid right subclavian artery. Results discussed telephone at the time of interpretation on 05/19/2018 at approximately 3:00 am to Dr. Amie Portland. Electronically Signed   By: Jeannine Boga M.D.   On: 05/19/2018 04:10   Ct Head Code Stroke Wo Contrast  Result Date: 05/19/2018 CLINICAL DATA:  Code stroke.  Initial evaluation for acute aphasia. EXAM: CT HEAD  WITHOUT CONTRAST TECHNIQUE: Contiguous axial images were obtained from the base of the skull through the vertex without intravenous contrast. COMPARISON:  Prior CT from 09/25/2017. FINDINGS: Brain: Age-related cerebral atrophy with chronic microvascular ischemic disease. No acute intracranial hemorrhage. No acute large vessel territory infarct. No mass lesion, midline shift or mass effect. No hydrocephalus. No extra-axial fluid collection. Vascular: No hyperdense vessel. Calcified atherosclerosis at the skull base. Skull: Scalp soft tissues demonstrate no acute abnormality. Calvarium intact. Sinuses/Orbits: Globes and orbital soft tissues demonstrate no acute finding. Chronic right sphenoid sinusitis noted. Small right mastoid effusion. Other: None. ASPECTS [facility] Stroke Program Early CT Score) - Ganglionic level infarction (caudate, lentiform nuclei, internal capsule, insula, M1-M3 cortex): 7 - Supraganglionic infarction (M4-M6 cortex): 3 Total score (0-10 with 10 being normal): 10 IMPRESSION: 1. No acute intracranial infarct or other abnormality identified. 2. ASPECTS is 10. 3. Age-related cerebral atrophy with chronic small vessel ischemic disease, stable. These results were communicated to Rory Percy at 2:57 amon 6/25/2019by text page via the [facility] messaging system. Electronically Signed   By: Jeannine Boga M.D.   On: 05/19/2018 02:58    My personal review of EKG: Rhythm NSR, Rate  77 /min,   no Acute ST changes   Assessment & Plan:     1.  Acute on chronic somnolence with some dysarthria in a patient with history of right-sided Bell's palsy, excessive daytime somnolence and narcotic addiction.  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  Currently placed on dual antiplatelet therapy along with Lipitor, full stroke work-up ordered.  Since she is n.p.o. will order stat lipid panel  this morning along with A1c, neurology on board.  Have also minimized potentially sedating medications, hold high-dose narcotics, minimize benzodiazepines, hold Flexeril.  Currently she is relatively symptom-free and has minimal deficits, dysarthria seems to have much improved.  2.  CAD.  No  acute issues status post stent.  Currently on dual antiplatelet therapy along with statin for secondary prevention, outpatient follow-up with PCP and primary cardiology for secondary risk factor modulation.  3.  DM type II.  Half home dose Lantus as she was n.p.o. last night, sliding scale and monitor.  A1c pending.  4.  Chronic pain, narcotic addiction history, excessive daytime somnolence history.  Anxiety depression.  Continue home medication Effexor, sedative medications reduced as above.  5.  Obesity.  Follow with PCP for weight loss.  6. HTN - permissive HTN for now, PRN IV Hydralazine.      DVT Prophylaxis Heparin    AM Labs Ordered, also please review Full Orders  Family Communication: Admission, patients condition and plan of care including tests being ordered have been discussed with the patient who  indicates understanding and agree with the plan and Code Status.  Code Status Full  Likely DC to  Home once stroke work-up is completed  Condition GUARDED    Consults called: Neurology  Admission status: Observation  Time spent in minutes : 40   Lala Lund M.D on 05/19/2018 at 11:02 AM  Between 7am to 7pm - Pager - [phone] ( page via [facility], text pages only, please mention full 10 digit call back number).  After 7pm go to [email or web address] - password [facility]  [facility] - Office  [phone]

## 2018-05-19 NOTE — Evaluation (Signed)
Occupational Therapy Evaluation Patient Details Name: [name] MRN: [id number] DOB: August 13, 1941 Today's Date: 05/19/2018    History of Present Illness Pt is a 77 y.o. female with PMH significant for coronary artery disease, hypertension, hypersomnia, diabetes, hyperlipidemia, hypertension, narcotic overuse, brought in for acute onset of word finding difficulty and confusion.  Has a history of Bell's palsy with right facial weakness. Pt and husband report multiple falls including one just prior to admission.    Clinical Impression   PTA, pt was living with her husband who assists with dressing and bathing tasks and was ambulating with rollator. She presents to OT evaluation lethargic with slow processing and decreased awareness. She was able to complete LB ADL with mod assist, UB ADL with min assist, and simulated toilet transfers with min assist and RW. She presents with decreased activity tolerance for ADL and reports BUE pain with all movements. Pt would benefit from continued OT services while admitted to improve independence and safety with ADL and functional mobility. At current functional level, unsure if husband able to provide the necessary assistance at home. If he is able to provide this, then recommend home health OT follow-up but if not may need to consider SNF placement for rehabilitation.    Follow Up Recommendations  SNF;Home health OT;Supervision/Assistance - 24 hour    Equipment Recommendations  Other (comment)(TBD)    Recommendations for Other Services       Precautions / Restrictions Precautions Precautions: Fall Restrictions Weight Bearing Restrictions: No      Mobility Bed Mobility Overal bed mobility: Needs Assistance Bed Mobility: Supine to Sit;Sit to Supine     Supine to sit: Mod assist Sit to supine: Min assist   General bed mobility comments: Assist to manage trunk and BLE.   Transfers Overall transfer level: Needs assistance Equipment used:  Rolling walker (2 wheeled) Transfers: Sit to/from Stand Sit to Stand: Min assist         General transfer comment: Assist to power up to standing.     Balance Overall balance assessment: Needs assistance Sitting-balance support: No upper extremity supported;Feet supported Sitting balance-Leahy Scale: Fair     Standing balance support: Bilateral upper extremity supported;Single extremity supported;During functional activity Standing balance-Leahy Scale: Poor Standing balance comment: Relies on support from RW                           ADL either performed or assessed with clinical judgement   ADL Overall ADL's : Needs assistance/impaired Eating/Feeding: Supervision/ safety;Sitting   Grooming: Supervision/safety;Sitting   Upper Body Bathing: Minimal assistance;Sitting   Lower Body Bathing: Moderate assistance;Sit to/from stand   Upper Body Dressing : Minimal assistance;Sitting   Lower Body Dressing: Moderate assistance;Sit to/from stand   Toilet Transfer: Minimal assistance;Ambulation;RW Toilet Transfer Details (indicate cue type and reason): simulated with short distance mobility in room Toileting- Clothing Manipulation and Hygiene: Minimal assistance;Sit to/from stand       Functional mobility during ADLs: Minimal assistance;Rolling walker General ADL Comments: Pt slightly unsteady. Difficulty accessing feet especially as socks becoming caught on toe nails.      Vision Baseline Vision/History: Wears glasses Wears Glasses: ("sometimes") Patient Visual Report: No change from baseline Additional Comments: Pt lethargic and closing eyes at times. Noted R eyelid drooping likely due to Bell's palsy.      Perception     Praxis      Pertinent Vitals/Pain Pain Assessment: Faces Faces Pain Scale: Hurts little more Pain  Location: B arms with any movement Pain Descriptors / Indicators: Aching;Discomfort Pain Intervention(s): Monitored during  session;Repositioned     Hand Dominance     Extremity/Trunk Assessment Upper Extremity Assessment Upper Extremity Assessment: RUE deficits/detail;LUE deficits/detail RUE Deficits / Details: Generally weak and reports pain with all movement. Decreased AROM R shoulder to 0-100 degrees scaption. Reports majority of her falls have landed her on the R arm.  LUE Deficits / Details: Generally weak with pain with all movement.    Lower Extremity Assessment Lower Extremity Assessment: Defer to PT evaluation       Communication Communication Communication: Other (comment)(pt lethargic and slightly slurred)   Cognition Arousal/Alertness: Lethargic Behavior During Therapy: WFL for tasks assessed/performed Overall Cognitive Status: Impaired/Different from baseline Area of Impairment: Attention;Awareness;Problem solving                   Current Attention Level: Selective       Awareness: Emergent Problem Solving: Slow processing General Comments: Pt with slow processing. Her husband reports she is closer to baseline but not quite there.    General Comments  Husband present during assessment.     Exercises     Shoulder Instructions      Home Living Family/patient expects to be discharged to:: Private residence Living Arrangements: Spouse/significant other Available Help at Discharge: Family;Available PRN/intermittently Type of Home: House Home Access: Stairs to enter Entergy CorporationEntrance Stairs-Number of Steps: 4 Entrance Stairs-Rails: None Home Layout: One level     Bathroom Shower/Tub: Tub/shower unit;Walk-in shower(typically does wash-ups)   Bathroom Toilet: Handicapped height     Home Equipment: Walker - 4 wheels;Cane - single point;Wheelchair - manual;Shower seat          Prior Functioning/Environment Level of Independence: Needs assistance  Gait / Transfers Assistance Needed: Uses rollator ADL's / Homemaking Assistance Needed: Husband helps with bathing and  dressing.             OT Problem List: Decreased strength;Decreased activity tolerance;Impaired balance (sitting and/or standing);Decreased safety awareness;Decreased cognition;Impaired vision/perception;Decreased coordination;Decreased knowledge of use of DME or AE;Decreased knowledge of precautions;Pain      OT Treatment/Interventions: Self-care/ADL training;Therapeutic exercise;Energy conservation;DME and/or AE instruction;Therapeutic activities;Patient/family education;Balance training    OT Goals(Current goals can be found in the care plan section) Acute Rehab OT Goals Patient Stated Goal: to get some rest OT Goal Formulation: With patient/family Time For Goal Achievement: 06/02/18 Potential to Achieve Goals: Good ADL Goals Pt Will Perform Grooming: with modified independence;standing Pt Will Perform Upper Body Dressing: with modified independence;sitting Pt Will Perform Lower Body Dressing: sit to/from stand;with min assist Pt Will Transfer to Toilet: with modified independence;ambulating(handicapped height toilet) Pt Will Perform Toileting - Clothing Manipulation and hygiene: with modified independence;sit to/from stand  OT Frequency: Min 2X/week   Barriers to D/C:            Co-evaluation              AM-PAC PT "6 Clicks" Daily Activity     Outcome Measure Help from another person eating meals?: A Little Help from another person taking care of personal grooming?: A Little Help from another person toileting, which includes using toliet, bedpan, or urinal?: A Lot Help from another person bathing (including washing, rinsing, drying)?: A Lot Help from another person to put on and taking off regular upper body clothing?: A Little Help from another person to put on and taking off regular lower body clothing?: A Lot 6 Click Score: 15   End of  Session Equipment Utilized During Treatment: Engineer, water Communication: Mobility status;Other (comment)(need to check  Pure Whick)  Activity Tolerance: Patient tolerated treatment well Patient left: in bed;with call bell/phone within reach;with bed alarm set  OT Visit Diagnosis: Other abnormalities of gait and mobility (R26.89);Pain Pain - Right/Left: Right(bilateral) Pain - part of body: Arm                Time: 4166-0630 OT Time Calculation (min): 34 min Charges:  OT General Charges $OT Visit: 1 Visit OT Evaluation $OT Eval Moderate Complexity: 1 Mod OT Treatments $Self Care/Home Management : 8-22 mins G-Codes:     Doristine Section, MS OTR/L  Pager: [phone]   Shahed Yeoman A Amirr Achord 05/19/2018, 5:50 PM

## 2018-05-19 NOTE — Progress Notes (Signed)
  Echocardiogram 2D Echocardiogram has been performed.  Kathleen SkeenVijay  Maleka Williams 05/19/2018, 2:21 PM

## 2018-05-19 NOTE — ED Notes (Signed)
Reported to RN on floor stroke swallow screen wasn't done patient is NPO

## 2018-05-19 NOTE — Evaluation (Signed)
SLP Cancellation Note  Patient Details Name: [name] MRN: [id number] DOB: 16-Sep-1941   Cancelled treatment:       Reason Eval/Treat Not Completed: Other (comment)(pt passed RNSSS, please order SLP swallow evaluation if indicated)  Kathleen Burnetamara Burnard Enis, MS [facility] SLP [phone]  [name], [name] 05/19/2018, 8:25 AM

## 2018-05-19 NOTE — Consult Note (Addendum)
Neurology Consultation  Reason for Consult: Code stroke Referring Physician: Dr. Leonette Monarch  CC: Confusion  History is obtained from: EMS, patient  HPI: [name] is a 77 y.o. female coronary artery disease, hypertension, hypersomnia, diabetes, hyperlipidemia, hypertension, narcotic overuse, brought in for acute onset of word finding difficulty and confusion.  Has a history of Bell's palsy with right facial weakness. She was last known normal at 12:01 AM on 05/19/2018, when her husband noticed that she appeared more confused and her speech sounded more slurred. EMS was called.  They noted her to be dysarthric.  No focal weakness.  They also noted that she was having word finding difficulty at the time. She was seen and examined in the emergency room.  Upon my initial evaluation, I asked her if she had taken any medications prior to all the symptom onset.  She said she took her atenolol as well as hydrocodone.  She did seem slightly lethargic but was awake and was able to converse. Please see my detailed exam below.  LKW: 12:01 AM on 05/19/2018 tpa given?: no, low NIH Premorbid modified Rankin scale (mRS): 0 ROS: ROS was performed and is negative except as noted in the HPI.    Past Medical History:  Diagnosis Date  . Abdominal discomfort    "due to medication intolerance"  . CAD (coronary artery disease)    previous stent  . Chronic pain syndrome 09/10/2013   Dr Nelva Bush,   . [place] Patient virus infection 1988  . Excessive daytime sleepiness 12/05/2014   Patient has not been seen for a sleep study as ordered, and used adderall to keep alert in daytime.  She agreed  In a contract not to have any scheduled medication for pain treatment from [place] and will not receive refills for Adderall, which was initiated by dr Orland Penman, PCP>   . Fall   . Hyperlipemia   . Hypersomnia, persistent 09/10/2013   Patient on  Stimulants .  Marland Kitchen Hypertension   . Narcotic addiction ([place]) 12/05/2014  . Obesity,  unspecified 09/10/2013  . Shoulder joint pain    both shoulders    Family History  Problem Relation Age of Onset  . Hypertension Mother   . Diabetes Father   . Heart attack Father   . Heart attack Brother    Social History:   reports that she has never smoked. She has never used smokeless tobacco. She reports that she does not drink alcohol or use drugs.  Medications No current facility-administered medications for this encounter.   Current Outpatient Medications:  .  ACCU-CHEK SOFTCLIX LANCETS lancets, USE TO TEST FOUR TIMES DAILY, Disp: 100 each, Rfl: 5 .  Alfalfa 250 MG TABS, Take 1 tablet by mouth every morning., Disp: , Rfl:  .  amphetamine-dextroamphetamine (ADDERALL) 20 MG tablet, Take 1 tablet (20 mg total) by mouth 2 (two) times daily as needed. (Patient taking differently: Take 20 mg by mouth 2 (two) times daily as needed (chronic fatigue syndrome). ), Disp: 60 tablet, Rfl: 0 .  aspirin 81 MG tablet, Take 81 mg by mouth daily., Disp: , Rfl:  .  atenolol (TENORMIN) 25 MG tablet, TAKE 1 TABLET BY MOUTH IN THE MORNING AND 2 IN THE EVENING, Disp: 270 tablet, Rfl: 0 .  blood glucose meter kit and supplies, Dispense based on patient and insurance preference. Use up to four times daily as directed. (FOR ICD-9 250.00, 250.01)., Disp: 1 each, Rfl: 0 .  Cholecalciferol (VITAMIN D3) 5000 units TBDP, Take 1  capsule by mouth daily., Disp: , Rfl:  .  CHROMIUM ASPARTATE PO, Take 1 tablet by mouth every morning., Disp: , Rfl:  .  Cinnamon Bark POWD, Take 1 Dose by mouth daily., Disp: , Rfl:  .  DHA-EPA-Coenzyme Q10-Vitamin E (GNP COQ-10 & FISH OIL) 120-180-50-30 CAPS, Take 3 tablets by mouth daily. , Disp: , Rfl:  .  diazepam (VALIUM) 5 MG tablet, Take 5 mg by mouth every 12 (twelve) hours as needed for anxiety. , Disp: , Rfl:  .  diazepam (VALIUM) 5 MG tablet, TAKE 1 TABLET BY MOUTH EVERY 12 HOURS AS NEEDED FOR ANXIETY, Disp: 60 tablet, Rfl: 1 .  diclofenac sodium (VOLTAREN) 1 % GEL, Apply  4 g topically 4 (four) times daily. (Patient not taking: Reported on 04/22/2018), Disp: 100 g, Rfl: 0 .  fexofenadine (ALLEGRA) 180 MG tablet, Take 180 mg by mouth daily as needed for allergies. , Disp: , Rfl:  .  glucose 4 GM chewable tablet, Chew 1 tablet by mouth as needed for low blood sugar., Disp: , Rfl:  .  glucose blood (ACCU-CHEK AVIVA PLUS) test strip, USE TO TEST FOUR TIMES DAILY, Disp: 100 each, Rfl: 0 .  hydrochlorothiazide (HYDRODIURIL) 25 MG tablet, Take 25 mg by mouth daily., Disp: , Rfl:  .  HYDROcodone-acetaminophen (NORCO) 10-325 MG per tablet, Take 1-2 tablets by mouth every 4 (four) hours as needed for moderate pain. , Disp: , Rfl:  .  Insulin Glargine (LANTUS SOLOSTAR) 100 UNIT/ML Solostar Pen, Inject 24 Units into the skin daily at 10 pm., Disp: 5 pen, Rfl: PRN .  Insulin Syringe-Needle U-100 (INSULIN SYRINGE .5CC/31GX5/16") 31G X 5/16" 0.5 ML MISC, Use as directed, Disp: 100 each, Rfl: 0 .  levothyroxine (SYNTHROID, LEVOTHROID) 50 MCG tablet, Take 1 tablet (50 mcg total) by mouth daily before breakfast., Disp: 30 tablet, Rfl: 5 .  lisdexamfetamine (VYVANSE) 30 MG capsule, Take 1 capsule (30 mg total) by mouth daily., Disp: 30 capsule, Rfl: 0 .  lisinopril (PRINIVIL,ZESTRIL) 20 MG tablet, TAKE 1 TABLET BY MOUTH IN THE MORNING AND 1/2 (ONE-HALF) AT BEDTIME, Disp: 135 tablet, Rfl: 0 .  magnesium oxide (MAG-OX) 400 MG tablet, Take 400 mg by mouth 2 (two) times daily., Disp: , Rfl:  .  metFORMIN (GLUCOPHAGE) 500 MG tablet, Take 1 tablet (500 mg total) by mouth 2 (two) times daily with a meal. Wk1: 1/2 tab 2x/day; Wk2: 1 tab am, 1/2 tab pm; Wk3: 2 tabs 2x/day., Disp: 180 tablet, Rfl: 3 .  methocarbamol (ROBAXIN) 500 MG tablet, Take 1 tablet (500 mg total) by mouth 2 (two) times daily as needed for muscle spasms. (Patient not taking: Reported on 04/22/2018), Disp: 20 tablet, Rfl: 0 .  Misc. Devices ([place]) MISC, 1 Units by Does not apply route daily as needed., Disp:  1 each, Rfl: 0 .  triamcinolone (KENALOG) 0.025 % ointment, Apply 1 application topically 2 (two) times daily., Disp: 30 g, Rfl: 0 .  venlafaxine (EFFEXOR) 100 MG tablet, TAKE 1 TABLET BY MOUTH ONCE DAILY, Disp: 30 tablet, Rfl: 1   Exam: Current vital signs: Wt 80.9 kg (178 lb 5.6 oz)   BMI 31.59 kg/m  Vital signs in last 24 hours: Weight:  [80.9 kg (178 lb 5.6 oz)] 80.9 kg (178 lb 5.6 oz) (06/25 0242) General: Patient is awake, alert in no apparent distress HEENT, normocephalic atraumatic.  Right IN bandage to prevent corneal ulceration because of the Bell's palsy.    GENERAL: Awake, alert in NAD  HEENT: - Normocephalic and atraumatic, dry mm, no LN++, no Thyromegally LUNGS - Clear to auscultation bilaterally with no wheezes CV - S1S2 RRR, no m/r/g, equal pulses bilaterally. ABDOMEN - Soft, nontender, nondistended with normoactive BS Ext: warm, well perfused, intact peripheral pulses, __ edema  NEURO:  Mental Status: AA&Ox2 Language: speech is dysarthric.  Naming is mildly impaired.  Repetition is intact.  Fluency is intact.  Poor attention concentration.  Comprehension is mildly impaired. Cranial Nerves: Left pupil is round reactive to light.  Right eyes and the bandage. EOMI, visual fields full on the left eye, right foot facial weakness, facial sensation intact, hearing intact, tongue/uvula/soft palate midline, normal sternocleidomastoid and trapezius muscle strength. No evidence of tongue atrophy or fibrillations Motor: 5/5 with no vertical drift all over Tone: is normal and bulk is normal Sensation- Intact to light touch bilaterally Coordination: FTN intact bilaterally Gait- deferred  NIHSS 1a Level of Conscious.: 0 1b LOC Questions: 1 1c LOC Commands: 0 2 Best Gaze: 0 3 Visual: 0 4 Facial Palsy: 2 5a Motor Arm - left: 0 5b Motor Arm - Right: 0 6a Motor Leg - Left: 0 6b Motor Leg - Right: 0 7 Limb Ataxia: 0 8 Sensory: 0 9 Best Language: 1 10 Dysarthria: 1 11  Extinct. and Inatten.: 0 TOTAL: 5 (delta NIH 2-3)    Labs I have reviewed labs in epic and the results pertinent to this consultation are:  CBC    Component Value Date/Time   WBC 8.6 05/19/2018 0239   RBC 4.57 05/19/2018 0239   HGB 12.6 05/19/2018 0242   HGB 11.4 03/25/2018 1730   HCT 37.0 05/19/2018 0242   HCT 35.1 03/25/2018 1730   PLT 274 05/19/2018 0239   PLT 265 03/25/2018 1730   MCV 84.0 05/19/2018 0239   MCV 82 03/25/2018 1730   MCH 26.5 05/19/2018 0239   MCHC 31.5 05/19/2018 0239   RDW 14.6 05/19/2018 0239   RDW 15.7 (H) 03/25/2018 1730   LYMPHSABS 2.1 05/19/2018 0239   LYMPHSABS 2.2 03/25/2018 1730   MONOABS 0.9 05/19/2018 0239   EOSABS 0.2 05/19/2018 0239   EOSABS 0.2 03/25/2018 1730   BASOSABS 0.1 05/19/2018 0239   BASOSABS 0.0 03/25/2018 1730    CMP     Component Value Date/Time   NA 133 (L) 05/19/2018 0242   NA 139 03/25/2018 1730   K 3.2 (L) 05/19/2018 0242   CL 95 (L) 05/19/2018 0242   CO2 25 03/25/2018 1730   GLUCOSE 246 (H) 05/19/2018 0242   BUN 34 (H) 05/19/2018 0242   BUN 32 (H) 03/25/2018 1730   CREATININE 1.00 05/19/2018 0242   CREATININE 0.85 07/19/2013 1654   CALCIUM 9.5 03/25/2018 1730   PROT 6.3 (L) 01/09/2018 0938   PROT 6.6 10/01/2017 1714   ALBUMIN 3.3 (L) 01/09/2018 0938   ALBUMIN 4.1 10/01/2017 1714   AST 42 (H) 01/09/2018 0938   ALT 18 01/09/2018 0938   ALKPHOS 88 01/09/2018 0938   BILITOT 1.3 (H) 01/09/2018 0938   BILITOT 0.3 10/01/2017 1714   GFRNONAA 62 03/25/2018 1730   GFRAA 71 03/25/2018 1730   Imaging I have reviewed the images obtained: CT-scan of the brain-no acute changes aspects 10. CT angiogram of the head and neck shows no emergent large vessel occlusion.  Shows stenosis of the left M2 superior division but no flow limitation.  On further detailed below, the left  hemispherical blood vessels seems to be less opacified than right side but that might be  technique related.  Assessment:  77 year old woman with  coronary artery disease, hypertension, diabetes, hyperlipidemia, hypertension, narcotic overuse, hypersomnia brought in as an acute code stroke for sudden onset of word finding difficulty confusion. She has a history of Bell's palsy on the right side. On examination, she did have some word finding difficulty and dysarthria which was improving. NIH stroke scale was a total of 5 that includes the baseline right facial weakness from Bell's palsy.  Change in NIH was probably 2 or 3 points. Because of the low NIH stroke scale, she is not a candidate for IV TPA. CT angios of the head and neck showed a possible stenosis of the left M2 superior division but no other large vessel occlusion responsible for current symptomatology. Differentials include acute ischemic stroke versus toxic metabolic encephalopathy in the setting of polypharmacy. Given her risk factors, I will recommend admission for stroke work-up  Impression: Evaluate for acute ischemic stroke Evaluate for toxic metabolic encephalopathy Hypertension Coronary artery disease Diabetes Hyperlipidemia Sleep apnea/hypersomnia Narcotic overuse  Recommendations: -Admit to hospitalist -Telemetry monitoring -Allow for permissive hypertension for the first 24-48h - only treat PRN if SBP >220 mmHg. Blood pressures can be gradually normalized to SBP<140 upon discharge. -MRI brain without contrast -Echocardiogram -HgbA1c, fasting lipid panel -Frequent neuro checks -Prophylactic therapy-Antiplatelet med: Aspirin - dose 354m PO  -Atorvastatin 80 mg PO daily -PT consult, OT consult, Speech consult -Limit sedating medications Please page stroke NP/PA/MD (listed on AMION)  from 8am-4 pm as this patient will be followed by the stroke team at this point.  -- AAmie Portland MD [facility] Pager: [phone] 7pm to 7am, please call on call as listed on AMION.  CRITICAL CARE ATTESTATION This patient is critically ill and at  significant risk of neurological worsening, death and care requires constant monitoring of vital signs, hemodynamics,respiratory and cardiac monitoring. I spent 45  minutes of neurocritical care time performing neurological assessment, discussion with family, other specialists and medical decision making of high complexityin the care of  this patient.

## 2018-05-19 NOTE — Progress Notes (Signed)
Pt passed stroke swallow screen. MD notified

## 2018-05-19 NOTE — Care Management Note (Signed)
Case Management Note  Patient Details  Name: Foye DeerJanice B Alewine MRN: [id number] Date of Birth: [birth year]  Subjective/Objective:    Pt in with r/o CVA. She is from home with spouse.                Action/Plan: MD please order PT/OT evals as needed. CM following for d/c needs, physician orders.   Expected Discharge Date:                  Expected Discharge Plan:     In-House Referral:     Discharge planning Services     Post Acute Care Choice:    Choice offered to:     DME Arranged:    DME Agency:     HH Arranged:    HH Agency:     Status of Service:  In process, will continue to follow  If discussed at Long Length of Stay Meetings, dates discussed:    Additional Comments:  Kermit BaloKelli F Finnley Lewis, RN 05/19/2018, 10:29 AM

## 2018-05-19 NOTE — Progress Notes (Signed)
Received a text page from the radiologist- left M3 occlusion.  Initial quick look and discussion over the phone had not revealed these findings. Patient is outside the window for TPA at this time.  Occlusion to distal for endovascular intervention. Recommendations as before in the consultation note.

## 2018-05-19 NOTE — Progress Notes (Signed)
Inpatient Diabetes Program Recommendations  AACE/ADA: New Consensus Statement on Inpatient Glycemic Control (2015)  Target Ranges:  Prepandial:   less than 140 mg/dL      Peak postprandial:   less than 180 mg/dL (1-2 hours)      Critically ill patients:  140 - 180 mg/dL   Lab Results  Component Value Date   GLUCAP 201 (H) 05/19/2018   HGBA1C 9.3 (H) 05/19/2018   Results for [name], [name] (MRN [id number]) as of 05/19/2018 13:42  Ref. Range 03/25/2018 17:30 05/19/2018 11:46  Hemoglobin A1C Latest Ref Range: 4.8 - 5.6 % 10.0 (H) 9.3 (H)   Review of Glycemic ControlResults for [name], [name] (MRN [id number]) as of 05/19/2018 13:42  Ref. Range 05/19/2018 02:36 05/19/2018 12:17  Glucose-Capillary Latest Ref Range: 70 - 99 mg/dL 782241 (H) 956201 (H)    Diabetes history: Type 2 DM  Outpatient Diabetes medications: Lantus 24 units q HS, Metformin 500 mg bid Current orders for Inpatient glycemic control:  Novolog sensitive tid with meals and HS, Lantus 12 units q HS  Inpatient Diabetes Program Recommendations:   Consider increasing Lantus to 24 units q HS (home dose).  A1C improved since May, 2019.   Thanks,  Beryl MeagerJenny Ripley Lovecchio, RN, BC-ADM Inpatient Diabetes Coordinator Pager [phone] (8a-5p)

## 2018-05-20 DIAGNOSIS — I639 Cerebral infarction, unspecified: Secondary | ICD-10-CM | POA: Diagnosis not present

## 2018-05-20 DIAGNOSIS — R2990 Unspecified symptoms and signs involving the nervous system: Secondary | ICD-10-CM | POA: Diagnosis not present

## 2018-05-20 LAB — BASIC METABOLIC PANEL
Anion gap: 9 (ref 5–15)
BUN: 12 mg/dL (ref 8–23)
CALCIUM: 9.5 mg/dL (ref 8.9–10.3)
CO2: 27 mmol/L (ref 22–32)
Chloride: 106 mmol/L (ref 98–111)
Creatinine, Ser: 0.66 mg/dL (ref 0.44–1.00)
Glucose, Bld: 154 mg/dL — ABNORMAL HIGH (ref 70–99)
Potassium: 3.6 mmol/L (ref 3.5–5.1)
Sodium: 142 mmol/L (ref 135–145)

## 2018-05-20 LAB — URINE CULTURE

## 2018-05-20 LAB — GLUCOSE, CAPILLARY
GLUCOSE-CAPILLARY: 217 mg/dL — AB (ref 70–99)
GLUCOSE-CAPILLARY: 156 mg/dL — AB (ref 70–99)
Glucose-Capillary: 162 mg/dL — ABNORMAL HIGH (ref 70–99)

## 2018-05-20 LAB — CBC
HCT: 38.1 % (ref 36.0–46.0)
Hemoglobin: 12.3 g/dL (ref 12.0–15.0)
MCH: 26.7 pg (ref 26.0–34.0)
MCHC: 32.3 g/dL (ref 30.0–36.0)
MCV: 82.8 fL (ref 78.0–100.0)
PLATELETS: 288 10*3/uL (ref 150–400)
RBC: 4.6 MIL/uL (ref 3.87–5.11)
RDW: 14.6 % (ref 11.5–15.5)
WBC: 6.4 10*3/uL (ref 4.0–10.5)

## 2018-05-20 MED ORDER — CLOPIDOGREL BISULFATE 75 MG PO TABS: 75.0000 mg | ORAL_TABLET | Freq: Every day | ORAL | 0 refills | Status: DC

## 2018-05-20 MED ORDER — ASPIRIN 81 MG PO TABS: 81.0000 mg | ORAL_TABLET | Freq: Every day | ORAL | Status: AC

## 2018-05-20 MED ORDER — ATORVASTATIN CALCIUM 40 MG PO TABS: 40.0000 mg | ORAL_TABLET | Freq: Every day | ORAL | 0 refills | Status: DC

## 2018-05-20 NOTE — Evaluation (Signed)
Speech Language Pathology Evaluation Patient Details Name: [name] MRN: [id number] DOB: October 03, 1941 Today's Date: 05/20/2018 Time: 4098-1191 SLP Time Calculation (min) (ACUTE ONLY): 26 min  Problem List:  Patient Active Problem List   Diagnosis Date Noted  . Acute CVA (cerebrovascular accident) (HCC) 05/19/2018  . Aphasia   . Dehydration 03/17/2018  . Generalized weakness 03/17/2018  . Near syncope 03/17/2018  . ARF (acute renal failure) (HCC) 03/16/2018  . Hyperglycemia 09/25/2017  . Type 2 diabetes mellitus without complication, with long-term current use of insulin (HCC) 04/24/2017  . ADD (attention deficit disorder) 12/30/2016  . Narcotic addiction (HCC) 12/05/2014  . Excessive daytime sleepiness 12/05/2014  . Alteration in psychomotor activity 12/05/2014  . Depression with somatization 12/05/2014  . Hypersomnia, persistent 09/10/2013  . Obesity, unspecified 09/10/2013  . Chronic pain syndrome 09/10/2013  . Depression 07/31/2013  . Medication intolerance 07/19/2013  . Anxiety 07/19/2013  . CAD (coronary artery disease) status post stent to distal right coronary artery 2006 06/20/2013  . Neurocardiogenic syncope 06/20/2013  . Essential hypertension 06/20/2013  . Hyperlipidemia 06/20/2013   Past Medical History:  Past Medical History:  Diagnosis Date  . Abdominal discomfort    "due to medication intolerance"  . CAD (coronary artery disease)    previous stent  . Chronic pain syndrome 09/10/2013   Dr Ethelene Hal,   . Malachi Carl virus infection 1988  . Excessive daytime sleepiness 12/05/2014   Patient has not been seen for a sleep study as ordered, and used adderall to keep alert in daytime.  She agreed  In a contract not to have any scheduled medication for pain treatment from [facility] and will not receive refills for Adderall, which was initiated by dr Ihor Dow, PCP>   . Fall   . Hyperlipemia   . Hypersomnia, persistent 09/10/2013   Patient on  Stimulants .  Marland Kitchen Hypertension    . Narcotic addiction (HCC) 12/05/2014  . Obesity, unspecified 09/10/2013  . Shoulder joint pain    both shoulders   Past Surgical History:  Past Surgical History:  Procedure Laterality Date  . ANGIOPLASTY  03/01/2002   cutting balloon mid RCA  . BACK SURGERY    . CARDIAC CATHETERIZATION  01/26/2007   mild diffuse CAD  . CARDIAC CATHETERIZATION  10/23/2009   nonobstructive CAD,60% prox RCA,50% prox LAD, 50% mid ramus  . CORONARY STENT PLACEMENT  03/15/2005   distal RCA  . NEPHRECTOMY     HPI:  77 y.o. female, with history of CAD status post stent placement, DM type II, hypertension, dyslipidemia, obesity, chronic pain with narcotic addiction, anxiety and depression, right-sided Bell's palsy, assistive daytime somnolence, who was brought into the hospital when her husband noticed that she was more confused than her usual self and her speech was somewhat slurred;CT head indicated on 05/19/18 no acute intracranial infarct or other abnormality identified     Assessment / Plan / Recommendation Clinical Impression   Pt administered portion of MOC[facility] Cognitive Assessment) with speech intelligible within complex conversation, oriented x4, pt stated her symptoms (dysarthria, aphasia, perseveration, confusion) appear to have resolved since admission on 05/18/18 where she experienced "word finding difficulty" and husband reports "nonsense words, garbled speech" when communicating and increased confusion; pt able to state current needs with ADL's being min-mod assistance depending on task, but pain is debilitating at times and this impedes her from completing some activities, so writing tasks were deferred.  Pt able to read environmental signs appropriately during assessment.  Pt stated she and  her husband would benefit from having home health care when D/C d/t limitations at home.  Pt appears to be functioning at baseline level, so ST will s/o at this time; please re-consult prn.    SLP  Assessment  SLP Recommendation/Assessment: Patient does not need any further Speech Language Pathology Services SLP Visit Diagnosis: Other (comment)    Follow Up Recommendations  Other (comment)(TBD)    Frequency and Duration   Evaluation only        SLP Evaluation Cognition  Overall Cognitive Status: Within Functional Limits for tasks assessed Arousal/Alertness: Awake/alert Orientation Level: Oriented X4 Memory: Appears intact Awareness: Appears intact Problem Solving: Appears intact Safety/Judgment: Appears intact Comments: Pt able to reiterate needs/compensations at home (i.e.: requires assistance with ADL's, [facility], etc)       Comprehension  Auditory Comprehension Overall Auditory Comprehension: Appears within functional limits for tasks assessed Commands: Within Functional Limits Conversation: Complex Other Conversation Comments: No aphasic components noted during complex conversation Interfering Components: Pain Visual Recognition/Discrimination Discrimination: Not tested Reading Comprehension Reading Status: Not tested    Expression Expression Primary Mode of Expression: Verbal Verbal Expression Overall Verbal Expression: Appears within functional limits for tasks assessed Initiation: No impairment Level of Generative/Spontaneous Verbalization: Conversation Repetition: No impairment Naming: No impairment Pragmatics: No impairment Interfering Components: Other (comment)(pain level) Non-Verbal Means of Communication: Not applicable Other Verbal Expression Comments: Pt initially presented with aphasic components to speech  (ie: neologisms, perseverations per husband/chart review) and dysarthric speech, but these symptoms appear to have resolved Written Expression Dominant Hand: Right Written Expression: Unable to assess (comment)(pain level with writing)   Oral / Motor  Oral Motor/Sensory Function Overall Oral Motor/Sensory Function:  Mild impairment Facial ROM: Reduced right;Other (Comment)(Hx of Bell's palsy 3/19) Facial Symmetry: Abnormal symmetry right Facial Strength: Reduced right Facial Sensation: Reduced right Lingual ROM: Reduced right Lingual Symmetry: Abnormal symmetry right Lingual Sensation: Reduced Motor Speech Overall Motor Speech: Appears within functional limits for tasks assessed Respiration: Within functional limits Phonation: Normal Resonance: Within functional limits Articulation: Within functional limitis Intelligibility: Intelligible Motor Planning: Witnin functional limits Motor Speech Errors: Not applicable                       Tressie StalkerPat Adams, M.S., CCC-SLP 05/20/2018, 1:34 PM

## 2018-05-20 NOTE — Progress Notes (Signed)
Inpatient Diabetes Program Recommendations  AACE/ADA: New Consensus Statement on Inpatient Glycemic Control (2015)  Target Ranges:  Prepandial:   less than 140 mg/dL      Peak postprandial:   less than 180 mg/dL (1-2 hours)      Critically ill patients:  140 - 180 mg/dL   Lab Results  Component Value Date   GLUCAP 217 (H) 05/20/2018   HGBA1C 9.3 (H) 05/19/2018    Review of Glycemic Control Results for [name], [name] (MRN [id number]) as of 05/20/2018 11:30  Ref. Range 05/19/2018 12:17 05/19/2018 15:35 05/19/2018 21:14 05/20/2018 06:02 05/20/2018 11:13  Glucose-Capillary Latest Ref Range: 70 - 99 mg/dL 952201 (H) 841197 (H) 324279 (H) 162 (H) 217 (H)   Diabetes history: Type 2 DM  Outpatient Diabetes medications: Lantus 24 units q HS, Metformin 500 mg bid Current orders for Inpatient glycemic control:  Novolog sensitive tid with meals and HS, Lantus 12 units q HS Inpatient Diabetes Program Recommendations:   Please consider increasing Lantus to 24 units q HS.  Also may need Novolog meal coverage 3 units tid with meals (hold if patient eats less than 50%).  Thanks,  Beryl MeagerJenny Abella Shugart, RN, BC-ADM Inpatient Diabetes Coordinator Pager [phone] (8a-5p)

## 2018-05-20 NOTE — Care Management Note (Addendum)
Case Management Note  Patient Details  Name: [name] MRN: [id number] Date of Birth: [birth year]  Subjective/Objective:    Pt in to r/o CVA. She is from home with spouse. Per MD pt was already set up for rehab prior to admission. Has DME: rollator, wheelchair, cane and shower chair at home.               Action/Plan: OT recommending SNF. CM met with the patients spouse and he states the rehab has been cancelled. He states his wife does not want to go to rehab she wants to go home and he is in agreement. CM inquired about the Home First program through [name]. Pts spouse in agreement. [name] with [facility] notified and will follow up with the patients spouse. CM following.  Addendum: 1700 pm: pt discharging home tonight. [name] with [facility] made aware. HH will see the patient in the am. Spouse spoke to [name] on the phone. Spouse feels he can provide transportation home.   Expected Discharge Date:                  Expected Discharge Plan:  [place]  In-House Referral:     Discharge planning Services  CM Consult  Post Acute Care Choice:  Home Health Choice offered to:  Spouse  DME Arranged:    DME Agency:     HH Arranged:  RN, PT, OT, Social Work [employer] Agency:  [facility](home first)  Status of Service:  In process, will continue to follow  If discussed at Long Length of Stay Meetings, dates discussed:    Additional Comments:  Pollie Friar, RN 05/20/2018, 4:31 PM

## 2018-05-20 NOTE — Progress Notes (Signed)
STROKE TEAM PROGRESS NOTE    INTERVAL HISTORY Her husband is at the bedside.   Speech trouble has resolved. She has right sided Bell`s palsy related facial weakness since march 2019  Vitals:   05/19/18 2345 05/20/18 0352 05/20/18 0830 05/20/18 1130  BP: 136/69 132/88 (!) 181/72 (!) 191/73  Pulse: 95 72 83 84  Resp: 16 18 18 18   Temp: 98.2 F (36.8 C) 98.1 F (36.7 C) 98.8 F (37.1 C) 98 F (36.7 C)  TempSrc: Oral Oral Oral Oral  SpO2: 98% 99% 97% 95%  Weight:      Height:        CBC:  Recent Labs  Lab 05/19/18 0239 05/19/18 0242 05/20/18 0445  WBC 8.6  --  6.4  NEUTROABS 5.3  --   --   HGB 12.1 12.6 12.3  HCT 38.4 37.0 38.1  MCV 84.0  --  82.8  PLT 274  --  288    Basic Metabolic Panel:  Recent Labs  Lab 05/19/18 0239 05/19/18 0242 05/19/18 1146 05/20/18 0445  NA 132* 133*  --  142  K 3.2* 3.2*  --  3.6  CL 95* 95*  --  106  CO2 24  --   --  27  GLUCOSE 249* 246*  --  154*  BUN 35* 34*  --  12  CREATININE 1.09* 1.00 0.83 0.66  CALCIUM 9.4  --   --  9.5   Lipid Panel:     Component Value Date/Time   CHOL 287 (H) 05/19/2018 1146   CHOL 346 (H) 09/23/2017 1803   TRIG 283 (H) 05/19/2018 1146   HDL 42 05/19/2018 1146   HDL 56 09/23/2017 1803   CHOLHDL 6.8 05/19/2018 1146   VLDL 57 (H) 05/19/2018 1146   LDLCALC 188 (H) 05/19/2018 1146   LDLCALC Comment 09/23/2017 1803   HgbA1c:  Lab Results  Component Value Date   HGBA1C 9.3 (H) 05/19/2018   Urine Drug Screen:     Component Value Date/Time   LABOPIA POSITIVE (A) 05/19/2018 0448   COCAINSCRNUR NONE DETECTED 05/19/2018 0448   LABBENZ POSITIVE (A) 05/19/2018 0448   AMPHETMU POSITIVE (A) 05/19/2018 0448   THCU NONE DETECTED 05/19/2018 0448   LABBARB (A) 05/19/2018 0448    Result not available. Reagent lot number recalled by manufacturer.    Alcohol Level     Component Value Date/Time   ETH <10 05/19/2018 0237    IMAGING Ct Angio Head W Or Wo Contrast  Result Date: 05/19/2018 CLINICAL  DATA:  Initial evaluation for acute aphasia. EXAM: CT ANGIOGRAPHY HEAD AND NECK TECHNIQUE: Multidetector CT imaging of the head and neck was performed using the standard protocol during bolus administration of intravenous contrast. Multiplanar CT image reconstructions and MIPs were obtained to evaluate the vascular anatomy. Carotid stenosis measurements (when applicable) are obtained utilizing NASCET criteria, using the distal internal carotid diameter as the denominator. CONTRAST:  50mL ISOVUE-370 IOPAMIDOL (ISOVUE-370) INJECTION 76% COMPARISON:  None. FINDINGS: CTA NECK FINDINGS Aortic arch: Visualized aortic arch of normal caliber with normal 3 vessel morphology. Moderate atheromatous change about the arch and origin of the great vessels without hemodynamically significant stenosis. Irregular soft plaque within the mid right subclavian artery with resultant severe stenosis (series 8, image 282). Right carotid system: Right common carotid artery patent from its origin to the bifurcation without stenosis. Eccentric calcified plaque about the right bifurcation/proximal right ICA with associated stenosis of up to 60% by NASCET criteria. Right ICA irregular but otherwise  patent to the skull base without stenosis, dissection, or occlusion. Left carotid system: Atheromatous plaque at the origin of the left common carotid artery with mild stenosis. Left common carotid artery irregular but otherwise patent to the bifurcation without significant narrowing. Calcified plaque at the left bifurcation with associated stenosis of approximately 50% by NASCET criteria. Eccentric plaque distally within the mid and distal left ICA without high-grade stenosis. Left ICA patent to the skull base without dissection or occlusion. Vertebral arteries: Both of the vertebral arteries arise from the subclavian arteries. Right vertebral artery dominant with a diffusely hypoplastic left vertebral artery. Next plaque at the proximal right V1  segment with short-segment severe stenosis (series 9, image 130). Dominant right vertebral artery otherwise patent to the skull base. Hypoplastic left vertebral artery is markedly irregular and attenuated with scant flow, essentially including by the V3 segment. Skeleton: No acute osseous abnormality. No worrisome lytic or blastic osseous lesions. Moderate cervical spondylolysis at C4-5 through C6-7. Other neck: No acute soft tissue abnormality within the neck. No adenopathy. Salivary glands and thyroid gland within normal limits. Upper chest: Visualized upper esophagus patulous with layering fluid within the esophageal lumen. Visualized upper chest and mediastinum demonstrate no other acute abnormality. Partially visualized lungs are grossly clear. Review of the MIP images confirms the above findings CTA HEAD FINDINGS Anterior circulation: Features left ICA patent. Extensive calcified atheromatous plaque throughout the cavernous and supraclinoid segments. Resultant moderate multifocal narrowing with a small more severe stenosis at the supraclinoid left ICA (series 8, image 106). ICA terminus narrowed but patent distally. Left M1 irregular but patent without high-grade stenosis. Focal moderate stenosis at the proximal left M2 superior division (series 10, image 134). Distally, there is occlusion of a left M3 branch (series 10, image 146), likely embolic. Distal left MCA branches otherwise perfused with demonstrate atheromatous irregularity. Petrous right ICA patent. Extensive atheromatous plaque throughout the cavernous/supraclinoid right ICA with moderate to severe multifocal narrowing. Right ICA terminus patent. Right M1 irregular but patent. Right MCA bifurcates early with focal moderate to severe stenoses at bifurcation. No proximal right M2 occlusion. Distal MCA branches patent with demonstrate multifocal atheromatous irregularity. A1 segments patent bilaterally. Normal anterior communicating artery. Multifocal  moderate left A2 stenoses. Right A2 diminutive with multifocal/tandem severe stenoses. Posterior circulation: Left vertebral artery essentially occluded at the skull base. Extensive atheromatous irregularity throughout the right V4 segment with moderate to severe multifocal stenoses. Right PICA patent proximally. Left PICA not visualized. Basilar overall diminutive with moderate diffuse narrowing. Superior cerebral arteries patent bilaterally. Both of the PCAs primarily supplied via the basilar. PCAs demonstrate extensive multifocal atheromatous irregularity but are patent to their distal aspects. Venous sinuses: Not well assessed due to arterial timing of the contrast bolus. Anatomic variants: None significant. Delayed phase: Not performed. Review of the MIP images confirms the above findings IMPRESSION: 1. Distal left M3 occlusion, likely embolic in nature. 2. Atheromatous stenoses about the carotid bifurcations with narrowing of up to 60% on the right and 50% on the left. 3. Severe extensive atheromatous change throughout the intracranial circulation, with most notable findings including severe left supraclinoid stenosis, multifocal severe right A2 stenoses, with extensive multifocal severe right V4 stenoses. Hypoplastic left vertebral artery occludes at the V3 segment. 4. Short-segment severe proximal right V1 stenosis. 5. Severe near occlusive stenosis of the mid right subclavian artery. Results discussed telephone at the time of interpretation on 05/19/2018 at approximately 3:00 am to Dr. Milon Dikes. Electronically Signed   By: Sharlet Salina  Phill MyronMcClintock M.D.   On: 05/19/2018 04:10   Ct Angio Neck W Or Wo Contrast  Result Date: 05/19/2018 CLINICAL DATA:  Initial evaluation for acute aphasia. EXAM: CT ANGIOGRAPHY HEAD AND NECK TECHNIQUE: Multidetector CT imaging of the head and neck was performed using the standard protocol during bolus administration of intravenous contrast. Multiplanar CT image reconstructions  and MIPs were obtained to evaluate the vascular anatomy. Carotid stenosis measurements (when applicable) are obtained utilizing NASCET criteria, using the distal internal carotid diameter as the denominator. CONTRAST:  50mL ISOVUE-370 IOPAMIDOL (ISOVUE-370) INJECTION 76% COMPARISON:  None. FINDINGS: CTA NECK FINDINGS Aortic arch: Visualized aortic arch of normal caliber with normal 3 vessel morphology. Moderate atheromatous change about the arch and origin of the great vessels without hemodynamically significant stenosis. Irregular soft plaque within the mid right subclavian artery with resultant severe stenosis (series 8, image 282). Right carotid system: Right common carotid artery patent from its origin to the bifurcation without stenosis. Eccentric calcified plaque about the right bifurcation/proximal right ICA with associated stenosis of up to 60% by NASCET criteria. Right ICA irregular but otherwise patent to the skull base without stenosis, dissection, or occlusion. Left carotid system: Atheromatous plaque at the origin of the left common carotid artery with mild stenosis. Left common carotid artery irregular but otherwise patent to the bifurcation without significant narrowing. Calcified plaque at the left bifurcation with associated stenosis of approximately 50% by NASCET criteria. Eccentric plaque distally within the mid and distal left ICA without high-grade stenosis. Left ICA patent to the skull base without dissection or occlusion. Vertebral arteries: Both of the vertebral arteries arise from the subclavian arteries. Right vertebral artery dominant with a diffusely hypoplastic left vertebral artery. Next plaque at the proximal right V1 segment with short-segment severe stenosis (series 9, image 130). Dominant right vertebral artery otherwise patent to the skull base. Hypoplastic left vertebral artery is markedly irregular and attenuated with scant flow, essentially including by the V3 segment. Skeleton:  No acute osseous abnormality. No worrisome lytic or blastic osseous lesions. Moderate cervical spondylolysis at C4-5 through C6-7. Other neck: No acute soft tissue abnormality within the neck. No adenopathy. Salivary glands and thyroid gland within normal limits. Upper chest: Visualized upper esophagus patulous with layering fluid within the esophageal lumen. Visualized upper chest and mediastinum demonstrate no other acute abnormality. Partially visualized lungs are grossly clear. Review of the MIP images confirms the above findings CTA HEAD FINDINGS Anterior circulation: Features left ICA patent. Extensive calcified atheromatous plaque throughout the cavernous and supraclinoid segments. Resultant moderate multifocal narrowing with a small more severe stenosis at the supraclinoid left ICA (series 8, image 106). ICA terminus narrowed but patent distally. Left M1 irregular but patent without high-grade stenosis. Focal moderate stenosis at the proximal left M2 superior division (series 10, image 134). Distally, there is occlusion of a left M3 branch (series 10, image 146), likely embolic. Distal left MCA branches otherwise perfused with demonstrate atheromatous irregularity. Petrous right ICA patent. Extensive atheromatous plaque throughout the cavernous/supraclinoid right ICA with moderate to severe multifocal narrowing. Right ICA terminus patent. Right M1 irregular but patent. Right MCA bifurcates early with focal moderate to severe stenoses at bifurcation. No proximal right M2 occlusion. Distal MCA branches patent with demonstrate multifocal atheromatous irregularity. A1 segments patent bilaterally. Normal anterior communicating artery. Multifocal moderate left A2 stenoses. Right A2 diminutive with multifocal/tandem severe stenoses. Posterior circulation: Left vertebral artery essentially occluded at the skull base. Extensive atheromatous irregularity throughout the right V4 segment with moderate to  severe  multifocal stenoses. Right PICA patent proximally. Left PICA not visualized. Basilar overall diminutive with moderate diffuse narrowing. Superior cerebral arteries patent bilaterally. Both of the PCAs primarily supplied via the basilar. PCAs demonstrate extensive multifocal atheromatous irregularity but are patent to their distal aspects. Venous sinuses: Not well assessed due to arterial timing of the contrast bolus. Anatomic variants: None significant. Delayed phase: Not performed. Review of the MIP images confirms the above findings IMPRESSION: 1. Distal left M3 occlusion, likely embolic in nature. 2. Atheromatous stenoses about the carotid bifurcations with narrowing of up to 60% on the right and 50% on the left. 3. Severe extensive atheromatous change throughout the intracranial circulation, with most notable findings including severe left supraclinoid stenosis, multifocal severe right A2 stenoses, with extensive multifocal severe right V4 stenoses. Hypoplastic left vertebral artery occludes at the V3 segment. 4. Short-segment severe proximal right V1 stenosis. 5. Severe near occlusive stenosis of the mid right subclavian artery. Results discussed telephone at the time of interpretation on 05/19/2018 at approximately 3:00 am to Dr. Milon Dikes. Electronically Signed   By: Rise Mu M.D.   On: 05/19/2018 04:10   Ct Head Code Stroke Wo Contrast  Result Date: 05/19/2018 CLINICAL DATA:  Code stroke.  Initial evaluation for acute aphasia. EXAM: CT HEAD WITHOUT CONTRAST TECHNIQUE: Contiguous axial images were obtained from the base of the skull through the vertex without intravenous contrast. COMPARISON:  Prior CT from 09/25/2017. FINDINGS: Brain: Age-related cerebral atrophy with chronic microvascular ischemic disease. No acute intracranial hemorrhage. No acute large vessel territory infarct. No mass lesion, midline shift or mass effect. No hydrocephalus. No extra-axial fluid collection. Vascular: No  hyperdense vessel. Calcified atherosclerosis at the skull base. Skull: Scalp soft tissues demonstrate no acute abnormality. Calvarium intact. Sinuses/Orbits: Globes and orbital soft tissues demonstrate no acute finding. Chronic right sphenoid sinusitis noted. Small right mastoid effusion. Other: None. ASPECTS [facility] Stroke Program Early CT Score) - Ganglionic level infarction (caudate, lentiform nuclei, internal capsule, insula, M1-M3 cortex): 7 - Supraganglionic infarction (M4-M6 cortex): 3 Total score (0-10 with 10 being normal): 10 IMPRESSION: 1. No acute intracranial infarct or other abnormality identified. 2. ASPECTS is 10. 3. Age-related cerebral atrophy with chronic small vessel ischemic disease, stable. These results were communicated to Wilford Corner at 2:57 amon 6/25/2019by text page via the [facility] messaging system. Electronically Signed   By: Rise Mu M.D.   On: 05/19/2018 02:58    PHYSICAL EXAM   Pleasant obese middle aged caucasian lady not in distress. . Afebrile. Head is nontraumatic. Neck is supple without bruit.    Cardiac exam no murmur or gallop. Lungs are clear to auscultation. Distal pulses are well felt. Neurological Exam ;  Awake  Alert oriented x 3. Normal speech   .eye movements full without nystagmus.fundi were not visualized. Vision acuity and fields appear normal. Hearing is normal. Palatal movements are normal. Face asymmetric with right lower greater than upper  face weakness.. Tongue midline. Normal strength, tone, reflexes and coordination. Normal sensation. Gait deferred.  ASSESSMENT/PLAN Ms. ELECTA STERRY is a 77 y.o. female with history of Bell's palsy with right facial weakness, coronary artery disease, hypertension, hypersomnia, diabetes, hyperlipidemia, narcotic overuse presenting with word finding difficulty, slurred speech and confusion..   Stroke:  Likely left brain infarct given M3 occlusion,   likely vessel to vessel atheroemboli from large vessel  atherosclerosis  Code Stroke CT head No acute stroke. Small vessel disease. Atrophy. ASPECTS 10.     CTA head & neck  distal M3 occlusion. R ICA 60%, L ICA 50%. Severe IC atherosclerosis: L ICA, R A2, R V4, L V3, R V1, mid R subclavian.   MRI  Not done as patient has some metal around eye 2D Echo  Left ventricle: The cavity size was normal. Wall thickness was   increased in a pattern of mild LVH. Systolic function was normal.   The estimated ejection fraction was in the range of 60% to 65%.   Wall motion was normal; there were no regional wall motion    abnormalities   LDL 188  HgbA1c 9.3  Heparin 5000 units sq tid for VTE prophylaxis  aspirin 81 mg daily prior to admission, now on aspirin 81 mg daily and clopidogrel 75 mg daily. Given mild stroke, continue aspirin 81 mg and plavix 75 mg daily x 3 weeks, then plavix alone.   Therapy recommendations:  pending   Disposition:  pending   Hypertension  Stable . Permissive hypertension (OK if < 220/120) but gradually normalize in 5-7 days . Long-term BP goal normotensive  Hyperlipidemia  Home meds:  No statin  LDL 188, goal < 70  Now on lipitor 40  Continue statin at discharge  Diabetes type II  HgbA1c 9.3, goal < 7.0  Other Stroke Risk Factors  Advanced age  Hx narcotic addition. Urine drug screen positive for opiates, benzodiazepines, amphetamines  Obesity, Body mass index is 31.59 kg/m., recommend weight loss, diet and exercise as appropriate   Coronary artery disease s/p angioplasty x 9 ([facility])  Other Active Problems  Acute on chronic somnolence,  sleep issues, Hx Malachi Carl (followed by Dr. Vickey Huger as an outpatient)  Chronic back pain, narcotic addiction  Hx Bell's Palsy with resultant right facial weakness  Hospital day # 0  .she presented with speech difficulties likely due to left MCA branch infarct etiology likely vessel to vessel acute emboli from large vessel atherosclerosis. MRI not safe to  obtain as patient states she has some mental the eyes.  . Recommend dual antiplatelet therapy of aspirin and Plavix for 3 months and aggressive risk factor modification. Long discussion with patient and husband at the bedside and answered questions. Discussed with Dr. Jomarie Longs. Greater than 50% time during this 25 minute visit was spent on counseling and coordination of care of her for stroke and answering questions. F/u as outpatient in stroke clinic in 3 months.  Delia Heady, MD Medical Director [facility] Pager: [phone] 05/20/2018 1:43 PM  To contact Stroke Continuity provider, please refer to [email or web address]. After hours, contact General Neurology

## 2018-05-20 NOTE — Progress Notes (Signed)
PROGRESS NOTE    [name]  ZOX:[id number] DOB: 05/31/1941 DOA: 05/19/2018 PCP: Sebastian Ache, PA-C  Brief Narrative:   [name]  is a 77 y.o. female, with history of CAD status post stent placement, DM type II, hypertension, dyslipidemia, obesity, chronic pain with narcotic dependence, anxiety and depression, right-sided Bell's palsy, excessive daytime somnolence, who was brought into the hospital when her husband noticed that she was more confused than her usual self and her speech was somewhat slurred.  Underwent CT angiogram head and neck in the ER where CT angiogram was positive for distal left M3 occlusion likely embolic  Assessment & Plan:   Suspected left brain infarct versus TIA  -CTA head with M3 occlusion, left brain infarct versus TIA suspected due to slurring of speech and word finding difficulty -This has improved -Unable to have MRI due to metal object around her eye -Neurology following, was on aspirin 81 mg prior to admission, now recommended to continue aspirin along with Plavix for 3 weeks followed by Plavix alone -2-D echocardiogram with preserved EF - CTA -noted distal M3 occlusion. R ICA 60%, L ICA 50%. Severe IC atherosclerosis -LDL is 188, however history of intolerance to statins -PT OT consult requested, SNF recommended  CAD -stable, continue aspirin -Unable to tolerate statins   DM type II.  -Continue lower dose of Lantus, sliding scale insulin -Hemoglobin A1c is 9.3 consistent with poor control at baseline  Chronic pain, narcotic Dependence, history of excessive daytime somnolence/ Anxiety depression. -continue Effexor, narcotic and benzo dose decreased -SNF for rehabilitation   Obesity.  Follow with PCP for weight loss.   HTN - permissive HTN for now -resume antihypertensives in 3-4 days  History of Bell's palsy  -improving since March/2019  DVT Prophylaxis Heparin    CODE STATUS: Full Family communication: Spouse  and friend at bedside Disposition SNF tomorrow if stable  Consultants:   Neuro   Procedures:   Antimicrobials:    Subjective:   Objective: Vitals:   05/19/18 2345 05/20/18 0352 05/20/18 0830 05/20/18 1130  BP: 136/69 132/88 (!) 181/72 (!) 191/73  Pulse: 95 72 83 84  Resp: 16 18 18 18   Temp: 98.2 F (36.8 C) 98.1 F (36.7 C) 98.8 F (37.1 C) 98 F (36.7 C)  TempSrc: Oral Oral Oral Oral  SpO2: 98% 99% 97% 95%  Weight:      Height:        Intake/Output Summary (Last 24 hours) at 05/20/2018 1521 Last data filed at 05/20/2018 0353 Gross per 24 hour  Intake -  Output 1602 ml  Net -1602 ml   Filed Weights   05/19/18 0242 05/19/18 2047  Weight: 80.9 kg (178 lb 5.6 oz) 80.9 kg (178 lb 5.6 oz)    Examination:  General exam: Appears calm and comfortable, no distress HEENT: right facial nerve weakness Respiratory system: Clear to auscultation. Respiratory effort normal. Cardiovascular system: S1 & S2 heard, RRR. No JVD, murmurs, rubs, gallops Gastrointestinal system: Abdomen is nondistended, soft and nontender.Normal bowel sounds heard. Central nervous system: Alert and oriented, right facial nerve palsy: Chronic, motor 4/5 in upper and lower extremities bilaterally, sensation light touch intact, DTR 1+, plantars withdrawal Extremities: no edema Skin: No rashes, lesions or ulcers Psychiatry: Judgement and insight appear normal. Mood & affect appropriate.     Data Reviewed:   CBC: Recent Labs  Lab 05/19/18 0239 05/19/18 0242 05/20/18 0445  WBC 8.6  --  6.4  NEUTROABS 5.3  --   --  HGB 12.1 12.6 12.3  HCT 38.4 37.0 38.1  MCV 84.0  --  82.8  PLT 274  --  288   Basic Metabolic Panel: Recent Labs  Lab 05/19/18 0239 05/19/18 0242 05/19/18 1146 05/20/18 0445  NA 132* 133*  --  142  K 3.2* 3.2*  --  3.6  CL 95* 95*  --  106  CO2 24  --   --  27  GLUCOSE 249* 246*  --  154*  BUN 35* 34*  --  12  CREATININE 1.09* 1.00 0.83 0.66  CALCIUM 9.4  --   --   9.5   GFR: Estimated Creatinine Clearance: 58.4 mL/min (by C-G formula based on SCr of 0.66 mg/dL). Liver Function Tests: Recent Labs  Lab 05/19/18 0239  AST 21  ALT 19  ALKPHOS 86  BILITOT 0.6  PROT 6.4*  ALBUMIN 3.5   No results for input(s): LIPASE, AMYLASE in the last 168 hours. No results for input(s): AMMONIA in the last 168 hours. Coagulation Profile: Recent Labs  Lab 05/19/18 0239  INR 0.98   Cardiac Enzymes: No results for input(s): CKTOTAL, CKMB, CKMBINDEX, TROPONINI in the last 168 hours. BNP (last 3 results) No results for input(s): PROBNP in the last 8760 hours. HbA1C: Recent Labs    05/19/18 1146  HGBA1C 9.3*   CBG: Recent Labs  Lab 05/19/18 1217 05/19/18 1535 05/19/18 2114 05/20/18 0602 05/20/18 1113  GLUCAP 201* 197* 279* 162* 217*   Lipid Profile: Recent Labs    05/19/18 1146  CHOL 287*  HDL 42  LDLCALC 188*  TRIG 283*  CHOLHDL 6.8   Thyroid Function Tests: No results for input(s): TSH, T4TOTAL, FREET4, T3FREE, THYROIDAB in the last 72 hours. Anemia Panel: No results for input(s): VITAMINB12, FOLATE, FERRITIN, TIBC, IRON, RETICCTPCT in the last 72 hours. Urine analysis:    Component Value Date/Time   COLORURINE STRAW (A) 05/19/2018 0448   APPEARANCEUR CLEAR 05/19/2018 0448   LABSPEC 1.015 05/19/2018 0448   PHURINE 5.0 05/19/2018 0448   GLUCOSEU 50 (A) 05/19/2018 0448   HGBUR NEGATIVE 05/19/2018 0448   BILIRUBINUR NEGATIVE 05/19/2018 0448   KETONESUR NEGATIVE 05/19/2018 0448   PROTEINUR NEGATIVE 05/19/2018 0448   UROBILINOGEN 0.2 07/15/2013 1819   NITRITE NEGATIVE 05/19/2018 0448   LEUKOCYTESUR LARGE (A) 05/19/2018 0448   Sepsis Labs: @LABRCNTIP (procalcitonin:4,lacticidven:4)  ) Recent Results (from the past 240 hour(s))  Urine culture     Status: Abnormal   Collection Time: 05/19/18  4:48 AM  Result Value Ref Range Status   Specimen Description URINE, CLEAN CATCH  Final   Special Requests NONE  Final   Culture (A)   Final    <10,000 COLONIES/mL >=100,000 COLONIES/mL Performed at [facility], [address] [place], [place] [address]    Report Status 05/20/2018 FINAL  Final         Radiology Studies: Ct Angio Head W Or Wo Contrast  Result Date: 05/19/2018 CLINICAL DATA:  Initial evaluation for acute aphasia. EXAM: CT ANGIOGRAPHY HEAD AND NECK TECHNIQUE: Multidetector CT imaging of the head and neck was performed using the standard protocol during bolus administration of intravenous contrast. Multiplanar CT image reconstructions and MIPs were obtained to evaluate the vascular anatomy. Carotid stenosis measurements (when applicable) are obtained utilizing NASCET criteria, using the distal internal carotid diameter as the denominator. CONTRAST:  50mL ISOVUE-370 IOPAMIDOL (ISOVUE-370) INJECTION 76% COMPARISON:  None. FINDINGS: CTA NECK FINDINGS Aortic arch: Visualized aortic arch of normal caliber with normal 3 vessel  morphology. Moderate atheromatous change about the arch and origin of the great vessels without hemodynamically significant stenosis. Irregular soft plaque within the mid right subclavian artery with resultant severe stenosis (series 8, image 282). Right carotid system: Right common carotid artery patent from its origin to the bifurcation without stenosis. Eccentric calcified plaque about the right bifurcation/proximal right ICA with associated stenosis of up to 60% by NASCET criteria. Right ICA irregular but otherwise patent to the skull base without stenosis, dissection, or occlusion. Left carotid system: Atheromatous plaque at the origin of the left common carotid artery with mild stenosis. Left common carotid artery irregular but otherwise patent to the bifurcation without significant narrowing. Calcified plaque at the left bifurcation with associated stenosis of approximately 50% by NASCET criteria. Eccentric plaque distally within the mid and distal left ICA without high-grade stenosis. Left  ICA patent to the skull base without dissection or occlusion. Vertebral arteries: Both of the vertebral arteries arise from the subclavian arteries. Right vertebral artery dominant with a diffusely hypoplastic left vertebral artery. Next plaque at the proximal right V1 segment with short-segment severe stenosis (series 9, image 130). Dominant right vertebral artery otherwise patent to the skull base. Hypoplastic left vertebral artery is markedly irregular and attenuated with scant flow, essentially including by the V3 segment. Skeleton: No acute osseous abnormality. No worrisome lytic or blastic osseous lesions. Moderate cervical spondylolysis at C4-5 through C6-7. Other neck: No acute soft tissue abnormality within the neck. No adenopathy. Salivary glands and thyroid gland within normal limits. Upper chest: Visualized upper esophagus patulous with layering fluid within the esophageal lumen. Visualized upper chest and mediastinum demonstrate no other acute abnormality. Partially visualized lungs are grossly clear. Review of the MIP images confirms the above findings CTA HEAD FINDINGS Anterior circulation: Features left ICA patent. Extensive calcified atheromatous plaque throughout the cavernous and supraclinoid segments. Resultant moderate multifocal narrowing with a small more severe stenosis at the supraclinoid left ICA (series 8, image 106). ICA terminus narrowed but patent distally. Left M1 irregular but patent without high-grade stenosis. Focal moderate stenosis at the proximal left M2 superior division (series 10, image 134). Distally, there is occlusion of a left M3 branch (series 10, image 146), likely embolic. Distal left MCA branches otherwise perfused with demonstrate atheromatous irregularity. Petrous right ICA patent. Extensive atheromatous plaque throughout the cavernous/supraclinoid right ICA with moderate to severe multifocal narrowing. Right ICA terminus patent. Right M1 irregular but patent. Right  MCA bifurcates early with focal moderate to severe stenoses at bifurcation. No proximal right M2 occlusion. Distal MCA branches patent with demonstrate multifocal atheromatous irregularity. A1 segments patent bilaterally. Normal anterior communicating artery. Multifocal moderate left A2 stenoses. Right A2 diminutive with multifocal/tandem severe stenoses. Posterior circulation: Left vertebral artery essentially occluded at the skull base. Extensive atheromatous irregularity throughout the right V4 segment with moderate to severe multifocal stenoses. Right PICA patent proximally. Left PICA not visualized. Basilar overall diminutive with moderate diffuse narrowing. Superior cerebral arteries patent bilaterally. Both of the PCAs primarily supplied via the basilar. PCAs demonstrate extensive multifocal atheromatous irregularity but are patent to their distal aspects. Venous sinuses: Not well assessed due to arterial timing of the contrast bolus. Anatomic variants: None significant. Delayed phase: Not performed. Review of the MIP images confirms the above findings IMPRESSION: 1. Distal left M3 occlusion, likely embolic in nature. 2. Atheromatous stenoses about the carotid bifurcations with narrowing of up to 60% on the right and 50% on the left. 3. Severe extensive atheromatous change throughout the intracranial  circulation, with most notable findings including severe left supraclinoid stenosis, multifocal severe right A2 stenoses, with extensive multifocal severe right V4 stenoses. Hypoplastic left vertebral artery occludes at the V3 segment. 4. Short-segment severe proximal right V1 stenosis. 5. Severe near occlusive stenosis of the mid right subclavian artery. Results discussed telephone at the time of interpretation on 05/19/2018 at approximately 3:00 am to Dr. Milon Dikes. Electronically Signed   By: Rise Mu M.D.   On: 05/19/2018 04:10   Ct Angio Neck W Or Wo Contrast  Result Date:  05/19/2018 CLINICAL DATA:  Initial evaluation for acute aphasia. EXAM: CT ANGIOGRAPHY HEAD AND NECK TECHNIQUE: Multidetector CT imaging of the head and neck was performed using the standard protocol during bolus administration of intravenous contrast. Multiplanar CT image reconstructions and MIPs were obtained to evaluate the vascular anatomy. Carotid stenosis measurements (when applicable) are obtained utilizing NASCET criteria, using the distal internal carotid diameter as the denominator. CONTRAST:  50mL ISOVUE-370 IOPAMIDOL (ISOVUE-370) INJECTION 76% COMPARISON:  None. FINDINGS: CTA NECK FINDINGS Aortic arch: Visualized aortic arch of normal caliber with normal 3 vessel morphology. Moderate atheromatous change about the arch and origin of the great vessels without hemodynamically significant stenosis. Irregular soft plaque within the mid right subclavian artery with resultant severe stenosis (series 8, image 282). Right carotid system: Right common carotid artery patent from its origin to the bifurcation without stenosis. Eccentric calcified plaque about the right bifurcation/proximal right ICA with associated stenosis of up to 60% by NASCET criteria. Right ICA irregular but otherwise patent to the skull base without stenosis, dissection, or occlusion. Left carotid system: Atheromatous plaque at the origin of the left common carotid artery with mild stenosis. Left common carotid artery irregular but otherwise patent to the bifurcation without significant narrowing. Calcified plaque at the left bifurcation with associated stenosis of approximately 50% by NASCET criteria. Eccentric plaque distally within the mid and distal left ICA without high-grade stenosis. Left ICA patent to the skull base without dissection or occlusion. Vertebral arteries: Both of the vertebral arteries arise from the subclavian arteries. Right vertebral artery dominant with a diffusely hypoplastic left vertebral artery. Next plaque at the  proximal right V1 segment with short-segment severe stenosis (series 9, image 130). Dominant right vertebral artery otherwise patent to the skull base. Hypoplastic left vertebral artery is markedly irregular and attenuated with scant flow, essentially including by the V3 segment. Skeleton: No acute osseous abnormality. No worrisome lytic or blastic osseous lesions. Moderate cervical spondylolysis at C4-5 through C6-7. Other neck: No acute soft tissue abnormality within the neck. No adenopathy. Salivary glands and thyroid gland within normal limits. Upper chest: Visualized upper esophagus patulous with layering fluid within the esophageal lumen. Visualized upper chest and mediastinum demonstrate no other acute abnormality. Partially visualized lungs are grossly clear. Review of the MIP images confirms the above findings CTA HEAD FINDINGS Anterior circulation: Features left ICA patent. Extensive calcified atheromatous plaque throughout the cavernous and supraclinoid segments. Resultant moderate multifocal narrowing with a small more severe stenosis at the supraclinoid left ICA (series 8, image 106). ICA terminus narrowed but patent distally. Left M1 irregular but patent without high-grade stenosis. Focal moderate stenosis at the proximal left M2 superior division (series 10, image 134). Distally, there is occlusion of a left M3 branch (series 10, image 146), likely embolic. Distal left MCA branches otherwise perfused with demonstrate atheromatous irregularity. Petrous right ICA patent. Extensive atheromatous plaque throughout the cavernous/supraclinoid right ICA with moderate to severe multifocal narrowing. Right ICA terminus  patent. Right M1 irregular but patent. Right MCA bifurcates early with focal moderate to severe stenoses at bifurcation. No proximal right M2 occlusion. Distal MCA branches patent with demonstrate multifocal atheromatous irregularity. A1 segments patent bilaterally. Normal anterior communicating  artery. Multifocal moderate left A2 stenoses. Right A2 diminutive with multifocal/tandem severe stenoses. Posterior circulation: Left vertebral artery essentially occluded at the skull base. Extensive atheromatous irregularity throughout the right V4 segment with moderate to severe multifocal stenoses. Right PICA patent proximally. Left PICA not visualized. Basilar overall diminutive with moderate diffuse narrowing. Superior cerebral arteries patent bilaterally. Both of the PCAs primarily supplied via the basilar. PCAs demonstrate extensive multifocal atheromatous irregularity but are patent to their distal aspects. Venous sinuses: Not well assessed due to arterial timing of the contrast bolus. Anatomic variants: None significant. Delayed phase: Not performed. Review of the MIP images confirms the above findings IMPRESSION: 1. Distal left M3 occlusion, likely embolic in nature. 2. Atheromatous stenoses about the carotid bifurcations with narrowing of up to 60% on the right and 50% on the left. 3. Severe extensive atheromatous change throughout the intracranial circulation, with most notable findings including severe left supraclinoid stenosis, multifocal severe right A2 stenoses, with extensive multifocal severe right V4 stenoses. Hypoplastic left vertebral artery occludes at the V3 segment. 4. Short-segment severe proximal right V1 stenosis. 5. Severe near occlusive stenosis of the mid right subclavian artery. Results discussed telephone at the time of interpretation on 05/19/2018 at approximately 3:00 am to Dr. Milon Dikes. Electronically Signed   By: Rise Mu M.D.   On: 05/19/2018 04:10   Ct Head Code Stroke Wo Contrast  Result Date: 05/19/2018 CLINICAL DATA:  Code stroke.  Initial evaluation for acute aphasia. EXAM: CT HEAD WITHOUT CONTRAST TECHNIQUE: Contiguous axial images were obtained from the base of the skull through the vertex without intravenous contrast. COMPARISON:  Prior CT from  09/25/2017. FINDINGS: Brain: Age-related cerebral atrophy with chronic microvascular ischemic disease. No acute intracranial hemorrhage. No acute large vessel territory infarct. No mass lesion, midline shift or mass effect. No hydrocephalus. No extra-axial fluid collection. Vascular: No hyperdense vessel. Calcified atherosclerosis at the skull base. Skull: Scalp soft tissues demonstrate no acute abnormality. Calvarium intact. Sinuses/Orbits: Globes and orbital soft tissues demonstrate no acute finding. Chronic right sphenoid sinusitis noted. Small right mastoid effusion. Other: None. ASPECTS [facility] Stroke Program Early CT Score) - Ganglionic level infarction (caudate, lentiform nuclei, internal capsule, insula, M1-M3 cortex): 7 - Supraganglionic infarction (M4-M6 cortex): 3 Total score (0-10 with 10 being normal): 10 IMPRESSION: 1. No acute intracranial infarct or other abnormality identified. 2. ASPECTS is 10. 3. Age-related cerebral atrophy with chronic small vessel ischemic disease, stable. These results were communicated to [place] at 2:57 amon 6/25/2019by text page via the [facility] messaging system. Electronically Signed   By: Rise Mu M.D.   On: 05/19/2018 02:58        Scheduled Meds: . aspirin  81 mg Oral Daily  . atorvastatin  40 mg Oral q1800  . clopidogrel  75 mg Oral Daily  . heparin  5,000 Units Subcutaneous Q8H  . insulin aspart  0-5 Units Subcutaneous QHS  . insulin aspart  0-9 Units Subcutaneous TID WC  . insulin glargine  12 Units Subcutaneous Q2200  . venlafaxine  100 mg Oral Daily   Continuous Infusions:   LOS: 0 days    Time spent:    Zannie Cove, MD [facility] Page via [email or web address], password [email or web address] After 7PM please contact night-coverage  05/20/2018, 3:21  PM

## 2018-05-20 NOTE — Care Management Obs Status (Signed)
MEDICARE OBSERVATION STATUS NOTIFICATION   Patient Details  Name: Kathleen DeerJanice B Blumenberg MRN: [id number] Date of Birth: [birth year]   Medicare Observation Status Notification Given:  Yes    Kermit BaloKelli F Jniya Madara, RN 05/20/2018, 3:25 PM

## 2018-05-20 NOTE — Evaluation (Signed)
Physical Therapy Evaluation Patient Details Name: [name] MRN: [id number] DOB: [birth year] Today's Date: 05/20/2018   History of Present Illness  Pt is a 77 y.o. female with PMH significant for coronary artery disease, hypertension, hypersomnia, diabetes, hyperlipidemia, hypertension, narcotic overuse, brought in for acute onset of word finding difficulty and confusion.  Has a history of Bell's palsy with right facial weakness. Pt and husband report multiple falls including one just prior to admission.   Clinical Impression  Orders received for PT evaluation. Patient demonstrates deficits in functional mobility as indicated below. Will benefit from continued skilled PT to address deficits and maximize function. Will see as indicated and progress as tolerated.      Follow Up Recommendations Home health PT;Supervision for mobility/OOB    Equipment Recommendations  None recommended by PT    Recommendations for Other Services       Precautions / Restrictions Precautions Precautions: Fall Restrictions Weight Bearing Restrictions: No      Mobility  Bed Mobility Overal bed mobility: Needs Assistance Bed Mobility: Supine to Sit;Sit to Supine     Supine to sit: Min guard Sit to supine: Min guard   General bed mobility comments: Increased time and effort but pt able to perform without physical assist. HOB flat with use of bed rails  Transfers Overall transfer level: Needs assistance Equipment used: Rolling walker (2 wheeled) Transfers: Sit to/from Stand Sit to Stand: Min guard         General transfer comment: Min guard for safety, increased time. Cues for hand placement  Ambulation/Gait Ambulation/Gait assistance: Min guard Gait Distance (Feet): 40 Feet Assistive device: Rolling walker (2 wheeled) Gait Pattern/deviations: Step-through pattern;Trunk flexed;Shuffle Gait velocity: decreased Gait velocity interpretation: <1.8 ft/sec, indicate of risk for recurrent  falls General Gait Details: some instability with ambulation, reliance on RW for support  Stairs            Wheelchair Mobility    Modified Rankin (Stroke Patients Only)       Balance Overall balance assessment: Needs assistance Sitting-balance support: Feet supported;No upper extremity supported Sitting balance-Leahy Scale: Good     Standing balance support: No upper extremity supported;During functional activity Standing balance-Leahy Scale: Fair Standing balance comment: Relies on support from RW                             Pertinent Vitals/Pain Pain Assessment: Faces Pain Score: 3  Faces Pain Scale: Hurts little more Pain Location: bil knees Pain Descriptors / Indicators: Discomfort;Sore Pain Intervention(s): Monitored during session    Home Living Family/patient expects to be discharged to:: Private residence Living Arrangements: Spouse/significant other Available Help at Discharge: Family;Available PRN/intermittently Type of Home: House Home Access: Stairs to enter Entrance Stairs-Rails: None Entrance Stairs-Number of Steps: 4 Home Layout: One level Home Equipment: Walker - 4 wheels;Cane - single point;Wheelchair - manual;Shower seat Additional Comments: Pt has entrance in back that only has 1 step and she uses this entrance most of the time.    Prior Function Level of Independence: Needs assistance   Gait / Transfers Assistance Needed: Uses rollator  ADL's / Homemaking Assistance Needed: Husband helps with bathing and dressing.   Comments: pt stated she is essentially homebound except for MD visits     Hand Dominance   Dominant Hand: Right    Extremity/Trunk Assessment   Upper Extremity Assessment Upper Extremity Assessment: Defer to OT evaluation    Lower Extremity Assessment Lower Extremity  Assessment: Generalized weakness       Communication   Communication: Other (comment)(pt lethargic and slightly slurred)  Cognition  Arousal/Alertness: Awake/alert Behavior During Therapy: WFL for tasks assessed/performed Overall Cognitive Status: Within Functional Limits for tasks assessed Area of Impairment: Attention;Awareness;Problem solving                   Current Attention Level: Selective       Awareness: Emergent Problem Solving: Slow processing General Comments: Slow speech and slow to respond to questions but appropriate throughout      General Comments      Exercises     Assessment/Plan    PT Assessment Patient needs continued PT services  PT Problem List Decreased strength;Decreased activity tolerance;Decreased balance;Decreased mobility;Pain       PT Treatment Interventions DME instruction;Gait training;Stair training;Functional mobility training;Therapeutic activities;Therapeutic exercise;Balance training;Patient/family education    PT Goals (Current goals can be found in the Care Plan section)  Acute Rehab PT Goals Patient Stated Goal: to go home PT Goal Formulation: With patient/family Time For Goal Achievement: 06/03/18 Potential to Achieve Goals: Good    Frequency Min 3X/week   Barriers to discharge        Co-evaluation               AM-PAC PT "6 Clicks" Daily Activity  Outcome Measure Difficulty turning over in bed (including adjusting bedclothes, sheets and blankets)?: A Little Difficulty moving from lying on back to sitting on the side of the bed? : A Little Difficulty sitting down on and standing up from a chair with arms (e.g., wheelchair, bedside commode, etc,.)?: A Little Help needed moving to and from a bed to chair (including a wheelchair)?: A Little Help needed walking in hospital room?: A Little Help needed climbing 3-5 steps with a railing? : A Lot 6 Click Score: 17    End of Session Equipment Utilized During Treatment: Gait belt Activity Tolerance: Patient tolerated treatment well Patient left: in bed;with call bell/phone within reach;with bed  alarm set;with family/visitor present Nurse Communication: Mobility status PT Visit Diagnosis: Unsteadiness on feet (R26.81)    Time: [id number] PT Time Calculation (min) (ACUTE ONLY): 17 min   Charges:   PT Evaluation $PT Eval Moderate Complexity: 1 Mod     PT G Codes:        Charlotte Crumbevon Nataya Bastedo, PT DPT  Board Certified Neurologic Specialist [phone]   Fabio AsaDevon J Dream Nodal 05/20/2018, 4:10 PM

## 2018-05-20 NOTE — Progress Notes (Addendum)
Occupational Therapy Treatment Patient Details Name: Kathleen DeerJanice B Brazzel MRN: [id number] DOB: 08/23/1941 Today's Date: 05/20/2018    History of present illness Pt is a 77 y.o. female with PMH significant for coronary artery disease, hypertension, hypersomnia, diabetes, hyperlipidemia, hypertension, narcotic overuse, brought in for acute onset of word finding difficulty and confusion.  Has a history of Bell's palsy with right facial weakness. Pt and husband report multiple falls including one just prior to admission.    OT comments  Pt with improvements in functional mobility and independence with ADL today; overall requiring min guard assist. Pt with slow processing and slower speech but follows all commands and answers all questions appropriately. Husband present throughout session and feels he can manage current level of assist pt requires. [facility] Will continue to follow acutely.   Follow Up Recommendations  Home health OT;Supervision/Assistance - 24 hour    Equipment Recommendations  None recommended by OT    Recommendations for Other Services      Precautions / Restrictions Precautions Precautions: Fall Restrictions Weight Bearing Restrictions: No       Mobility Bed Mobility Overal bed mobility: Needs Assistance Bed Mobility: Supine to Sit;Sit to Supine     Supine to sit: Min guard Sit to supine: Min guard   General bed mobility comments: Increased time and effort but pt able to perform without physical assist. HOB flat with use of bed rails  Transfers Overall transfer level: Needs assistance Equipment used: Rolling walker (2 wheeled) Transfers: Sit to/from Stand Sit to Stand: Min guard         General transfer comment: Min guard for safety, increased time. Cues for hand placement    Balance Overall balance assessment: Needs assistance Sitting-balance support: Feet supported;No upper extremity supported Sitting  balance-Leahy Scale: Good     Standing balance support: No upper extremity supported;During functional activity Standing balance-Leahy Scale: Fair                             ADL either performed or assessed with clinical judgement   ADL Overall ADL's : Needs assistance/impaired             Lower Body Bathing: Min guard;Sit to/from stand Lower Body Bathing Details (indicate cue type and reason): Simulated at EOB. Pt able to cross foot over opposite knee to simulated washing down to feet     Lower Body Dressing: Min guard;Sit to/from stand Lower Body Dressing Details (indicate cue type and reason): Simulated at EOB Toilet Transfer: Min guard;Ambulation;Regular Toilet;RW       Tub/ Shower Transfer: Min guard;Tub transfer;Ambulation;Shower Dealerseat;Rolling walker Tub/Shower Transfer Details (indicate cue type and reason): Close min guard; husband reports he can assist with tub transfers at home Functional mobility during ADLs: Health and safety inspectorMin guard;Rolling walker       Vision       Perception     Praxis      Cognition Arousal/Alertness: Awake/alert Behavior During Therapy: WFL for tasks assessed/performed Overall Cognitive Status: Within Functional Limits for tasks assessed                                 General Comments: Slow speech and slow to respond to questions but appropriate throughout        Exercises     Shoulder Instructions       General Comments  Pertinent Vitals/ Pain       Pain Assessment: Faces Pain Score: 3  Faces Pain Scale: Hurts little more Pain Location: bil knees Pain Descriptors / Indicators: Discomfort;Sore Pain Intervention(s): Monitored during session  Home Living     Available Help at Discharge: Family;Available PRN/intermittently                                Lives With: Spouse    Prior Functioning/Environment              Frequency  Min 2X/week        Progress Toward  Goals  OT Goals(current goals can now be found in the care plan section)  Progress towards OT goals: Progressing toward goals  Acute Rehab OT Goals Patient Stated Goal: return home OT Goal Formulation: With patient/family  Plan Discharge plan needs to be updated    Co-evaluation                 AM-PAC PT "6 Clicks" Daily Activity     Outcome Measure   Help from another person eating meals?: None Help from another person taking care of personal grooming?: A Little Help from another person toileting, which includes using toliet, bedpan, or urinal?: A Little Help from another person bathing (including washing, rinsing, drying)?: A Little Help from another person to put on and taking off regular upper body clothing?: A Little Help from another person to put on and taking off regular lower body clothing?: A Little 6 Click Score: 19    End of Session Equipment Utilized During Treatment: Gait belt;Rolling walker  OT Visit Diagnosis: Other abnormalities of gait and mobility (R26.89);Pain Pain - Right/Left: (bil) Pain - part of body: Knee   Activity Tolerance Patient tolerated treatment well   Patient Left in bed;with call bell/phone within reach;with bed alarm set;with family/visitor present   Nurse Communication          Time: 1610-9604 OT Time Calculation (min): 23 min  Charges: OT General Charges $OT Visit: 1 Visit OT Treatments $Self Care/Home Management : 23-37 mins  Levonte Molina A. Brett Albino, M.S., OTR/L Acute Rehab Department: [phone]    Gaye Alken 05/20/2018, 2:05 PM

## 2018-05-20 NOTE — Discharge Summary (Signed)
Physician Discharge Summary  [name] LKG:[id number] DOB: 1941/11/04 DOA: 05/19/2018  PCP: Kathleen Hiss, PA-C  Admit date: 05/19/2018 Discharge date: 05/20/2018  Time spent:  35 minutes  Recommendations for Outpatient Follow-up:  1. PCP in 1 week 2. Neurology Dr.Sethi in 1 month   Discharge Diagnoses:  Principal Problem:   Acute CVA (cerebrovascular accident) [facility] Active Problems:   CAD (coronary artery disease) status post stent to distal right coronary artery 2006   Essential hypertension   Anxiety   Obesity, unspecified   Chronic pain syndrome   Narcotic dependence   Excessive daytime sleepiness   Type 2 diabetes mellitus without complication, with long-term current use of insulin ([place])    Discharge Condition: stable  Diet recommendation: DM heart healthy  Filed Weights   05/19/18 0242 05/19/18 2047  Weight: 80.9 kg (178 lb 5.6 oz) 80.9 kg (178 lb 5.6 oz)    History of present illness:  JaniceMeetzeis a77 y.o.female,with history of CAD status post stent placement, DM type II, hypertension, dyslipidemia, obesity, chronic pain with narcotic dependence, anxiety and depression, right-sided Bell's palsy, excessive daytime somnolence, who was brought into the hospital when her husband noticed that she was more confused than her usual self and her speech was somewhat slurred. Underwent CT angiogram head and neck in the ER where CT angiogram was positive for distal left M3 occlusion likely embolic    Hospital Course:  Suspected left brain infarct versus TIA  -CTA head with M3 occlusion, left brain infarct versus TIA suspected due to slurring of speech and word finding difficulty -this has improved -Unable to have MRI due to metal object around her eye -Neurology consulted, was on aspirin 81 mg prior to admission, now recommended to continue aspirin along with Plavix for 3 weeks followed by Plavix alone -2-D echocardiogram with preserved EF - CTA  -noted distal M3 occlusion. R ICA 60%, L ICA 50%. Severe IC atherosclerosis -LDL is 188, started statin, h/o fibromyalgia and concerned about statins, recommended to try and monitor for side effects -PT OT consult requested, Home health recommended and set up  CAD -stable, continue aspirin -Unable to tolerate statins  DM type II.  -Continue lower dose of Lantus -Hemoglobin A1c is 9.3 consistent with poor control at baseline  Chronic pain, narcotic Dependence, history of excessive daytime somnolence/Anxiety depression. -continue Effexor, home regimen of benzo and narcotic, defer to PCP to wean these down due to known h/o daytime somnolence -PT eval completed, HH recommended and set up -also on adderol, this was continued  Obesity. Follow with PCP for weight loss.   HTN - permissive HTN for now -resume antihypertensives at discharge  History of Bell's palsy  -improving since March/2019   Consultants:   Neuro    Discharge Exam: Vitals:   05/20/18 1130 05/20/18 1614  BP: (!) 191/73 (!) 182/79  Pulse: 84 89  Resp: 18 18  Temp: 98 F (36.7 C) 97.7 F (36.5 C)  SpO2: 95% 99%    General: AAOx3 Cardiovascular: S1S2/RRR Respiratory: CTAB  Discharge Instructions   Discharge Instructions    Diet - low sodium heart healthy   Complete by:  As directed    Diet Carb Modified   Complete by:  As directed    Increase activity slowly   Complete by:  As directed      Allergies as of 05/20/2018      Reactions   Azithromycin Nausea And Vomiting   Cardizem [diltiazem Hcl] Nausea And Vomiting   Codeine  Other (See Comments)   Feeling weird   Coreg [carvedilol] Nausea And Vomiting   Demerol [meperidine] Other (See Comments)   Per pt disoriented   Erythromycin Nausea And Vomiting   Very sick   Inderal [propranolol] Other (See Comments)   Made me cry   Procardia [nifedipine] Other (See Comments)   Disoriented, sick   Wellbutrin [bupropion] Other (See Comments)    Unknown reaction   Zoloft [sertraline Hcl] Other (See Comments)   Could not talk, stiff      Medication List    TAKE these medications   ACCU-CHEK SOFTCLIX LANCETS lancets USE TO TEST FOUR TIMES DAILY   Alfalfa 250 MG Tabs Take 1 tablet by mouth every morning.   amphetamine-dextroamphetamine 20 MG tablet Commonly known as:  ADDERALL Take 1 tablet (20 mg total) by mouth 2 (two) times daily as needed. What changed:  reasons to take this   aspirin 81 MG tablet Take 1 tablet (81 mg total) by mouth daily for 21 days. For 3 weeks then STOP What changed:  additional instructions   atenolol 25 MG tablet Commonly known as:  TENORMIN TAKE 1 TABLET BY MOUTH IN THE MORNING AND 2 IN THE EVENING   atorvastatin 40 MG tablet Commonly known as:  LIPITOR Take 1 tablet (40 mg total) by mouth daily at 6 PM.   blood glucose meter kit and supplies Dispense based on patient and insurance preference. Use up to four times daily as directed. (FOR ICD-9 250.00, 250.01).   CHROMIUM ASPARTATE PO Take 1 tablet by mouth every morning.   Cinnamon Bark Powd Take 1 Dose by mouth daily.   clopidogrel 75 MG tablet Commonly known as:  PLAVIX Take 1 tablet (75 mg total) by mouth daily. Start taking on:  05/21/2018   diazepam 5 MG tablet Commonly known as:  VALIUM TAKE 1 TABLET BY MOUTH EVERY 12 HOURS AS NEEDED FOR ANXIETY   erythromycin ophthalmic ointment Place 1 application into the right eye 2 (two) times daily.   fexofenadine 180 MG tablet Commonly known as:  ALLEGRA Take 180 mg by mouth daily as needed for allergies.   fluorometholone 0.1 % ophthalmic suspension Commonly known as:  FML Place 1 drop into the right eye 4 (four) times daily.   glucose 4 GM chewable tablet Chew 1 tablet by mouth as needed for low blood sugar.   glucose blood test strip Commonly known as:  ACCU-CHEK AVIVA PLUS USE TO TEST FOUR TIMES DAILY   GNP COQ-10 & FISH OIL 120-180-50-30 Caps Generic drug:   DHA-EPA-Coenzyme Q10-Vitamin E Take 3 tablets by mouth daily.   HUGO ROLLING WALKER ELITE Misc 1 Units by Does not apply route daily as needed.   hydrochlorothiazide 25 MG tablet Commonly known as:  HYDRODIURIL Take 25 mg by mouth daily.   HYDROcodone-acetaminophen 10-325 MG tablet Commonly known as:  NORCO Take 1-2 tablets by mouth every 4 (four) hours as needed for moderate pain.   Insulin Glargine 100 UNIT/ML Solostar Pen Commonly known as:  LANTUS SOLOSTAR Inject 24 Units into the skin daily at 10 pm.   INSULIN SYRINGE .5CC/31GX5/16" 31G X 5/16" 0.5 ML Misc Use as directed   levothyroxine 50 MCG tablet Commonly known as:  SYNTHROID, LEVOTHROID Take 1 tablet (50 mcg total) by mouth daily before breakfast.   lisinopril 20 MG tablet Commonly known as:  PRINIVIL,ZESTRIL TAKE 1 TABLET BY MOUTH IN THE MORNING AND 1/2 (ONE-HALF) AT BEDTIME   magnesium oxide 400 MG tablet Commonly known  as:  MAG-OX Take 400 mg by mouth 2 (two) times daily.   potassium chloride 10 MEQ tablet Commonly known as:  K-DUR Take 10 mEq by mouth daily.   venlafaxine 100 MG tablet Commonly known as:  EFFEXOR TAKE 1 TABLET BY MOUTH ONCE DAILY   Vitamin D3 5000 units Tbdp Take 1 capsule by mouth daily.      Allergies  Allergen Reactions  . Azithromycin Nausea And Vomiting  . Cardizem [Diltiazem Hcl] Nausea And Vomiting  . Codeine Other (See Comments)    Feeling weird   . Coreg [Carvedilol] Nausea And Vomiting  . Demerol [Meperidine] Other (See Comments)    Per pt disoriented  . Erythromycin Nausea And Vomiting    Very sick  . Inderal [Propranolol] Other (See Comments)    Made me cry  . Procardia [Nifedipine] Other (See Comments)    Disoriented, sick  . Wellbutrin [Bupropion] Other (See Comments)    Unknown reaction  . Zoloft [Sertraline Hcl] Other (See Comments)    Could not talk, stiff      The results of significant diagnostics from this hospitalization (including imaging,  microbiology, ancillary and laboratory) are listed below for reference.    Significant Diagnostic Studies: Ct Angio Head W Or Wo Contrast  Result Date: 05/19/2018 CLINICAL DATA:  Initial evaluation for acute aphasia. EXAM: CT ANGIOGRAPHY HEAD AND NECK TECHNIQUE: Multidetector CT imaging of the head and neck was performed using the standard protocol during bolus administration of intravenous contrast. Multiplanar CT image reconstructions and MIPs were obtained to evaluate the vascular anatomy. Carotid stenosis measurements (when applicable) are obtained utilizing NASCET criteria, using the distal internal carotid diameter as the denominator. CONTRAST:  10m ISOVUE-370 IOPAMIDOL (ISOVUE-370) INJECTION 76% COMPARISON:  None. FINDINGS: CTA NECK FINDINGS Aortic arch: Visualized aortic arch of normal caliber with normal 3 vessel morphology. Moderate atheromatous change about the arch and origin of the great vessels without hemodynamically significant stenosis. Irregular soft plaque within the mid right subclavian artery with resultant severe stenosis (series 8, image 282). Right carotid system: Right common carotid artery patent from its origin to the bifurcation without stenosis. Eccentric calcified plaque about the right bifurcation/proximal right ICA with associated stenosis of up to 60% by NASCET criteria. Right ICA irregular but otherwise patent to the skull base without stenosis, dissection, or occlusion. Left carotid system: Atheromatous plaque at the origin of the left common carotid artery with mild stenosis. Left common carotid artery irregular but otherwise patent to the bifurcation without significant narrowing. Calcified plaque at the left bifurcation with associated stenosis of approximately 50% by NASCET criteria. Eccentric plaque distally within the mid and distal left ICA without high-grade stenosis. Left ICA patent to the skull base without dissection or occlusion. Vertebral arteries: Both of the  vertebral arteries arise from the subclavian arteries. Right vertebral artery dominant with a diffusely hypoplastic left vertebral artery. Next plaque at the proximal right V1 segment with short-segment severe stenosis (series 9, image 130). Dominant right vertebral artery otherwise patent to the skull base. Hypoplastic left vertebral artery is markedly irregular and attenuated with scant flow, essentially including by the V3 segment. Skeleton: No acute osseous abnormality. No worrisome lytic or blastic osseous lesions. Moderate cervical spondylolysis at C4-5 through C6-7. Other neck: No acute soft tissue abnormality within the neck. No adenopathy. Salivary glands and thyroid gland within normal limits. Upper chest: Visualized upper esophagus patulous with layering fluid within the esophageal lumen. Visualized upper chest and mediastinum demonstrate no other acute abnormality. Partially  visualized lungs are grossly clear. Review of the MIP images confirms the above findings CTA HEAD FINDINGS Anterior circulation: Features left ICA patent. Extensive calcified atheromatous plaque throughout the cavernous and supraclinoid segments. Resultant moderate multifocal narrowing with a small more severe stenosis at the supraclinoid left ICA (series 8, image 106). ICA terminus narrowed but patent distally. Left M1 irregular but patent without high-grade stenosis. Focal moderate stenosis at the proximal left M2 superior division (series 10, image 134). Distally, there is occlusion of a left M3 branch (series 10, image 938), likely embolic. Distal left MCA branches otherwise perfused with demonstrate atheromatous irregularity. Petrous right ICA patent. Extensive atheromatous plaque throughout the cavernous/supraclinoid right ICA with moderate to severe multifocal narrowing. Right ICA terminus patent. Right M1 irregular but patent. Right MCA bifurcates early with focal moderate to severe stenoses at bifurcation. No proximal right  M2 occlusion. Distal MCA branches patent with demonstrate multifocal atheromatous irregularity. A1 segments patent bilaterally. Normal anterior communicating artery. Multifocal moderate left A2 stenoses. Right A2 diminutive with multifocal/tandem severe stenoses. Posterior circulation: Left vertebral artery essentially occluded at the skull base. Extensive atheromatous irregularity throughout the right V4 segment with moderate to severe multifocal stenoses. Right PICA patent proximally. Left PICA not visualized. Basilar overall diminutive with moderate diffuse narrowing. Superior cerebral arteries patent bilaterally. Both of the PCAs primarily supplied via the basilar. PCAs demonstrate extensive multifocal atheromatous irregularity but are patent to their distal aspects. Venous sinuses: Not well assessed due to arterial timing of the contrast bolus. Anatomic variants: None significant. Delayed phase: Not performed. Review of the MIP images confirms the above findings IMPRESSION: 1. Distal left M3 occlusion, likely embolic in nature. 2. Atheromatous stenoses about the carotid bifurcations with narrowing of up to 60% on the right and 50% on the left. 3. Severe extensive atheromatous change throughout the intracranial circulation, with most notable findings including severe left supraclinoid stenosis, multifocal severe right A2 stenoses, with extensive multifocal severe right V4 stenoses. Hypoplastic left vertebral artery occludes at the V3 segment. 4. Short-segment severe proximal right V1 stenosis. 5. Severe near occlusive stenosis of the mid right subclavian artery. Results discussed telephone at the time of interpretation on 05/19/2018 at approximately 3:00 am to Dr. Amie Portland. Electronically Signed   By: Jeannine Boga M.D.   On: 05/19/2018 04:10   Ct Angio Neck W Or Wo Contrast  Result Date: 05/19/2018 CLINICAL DATA:  Initial evaluation for acute aphasia. EXAM: CT ANGIOGRAPHY HEAD AND NECK TECHNIQUE:  Multidetector CT imaging of the head and neck was performed using the standard protocol during bolus administration of intravenous contrast. Multiplanar CT image reconstructions and MIPs were obtained to evaluate the vascular anatomy. Carotid stenosis measurements (when applicable) are obtained utilizing NASCET criteria, using the distal internal carotid diameter as the denominator. CONTRAST:  8m ISOVUE-370 IOPAMIDOL (ISOVUE-370) INJECTION 76% COMPARISON:  None. FINDINGS: CTA NECK FINDINGS Aortic arch: Visualized aortic arch of normal caliber with normal 3 vessel morphology. Moderate atheromatous change about the arch and origin of the great vessels without hemodynamically significant stenosis. Irregular soft plaque within the mid right subclavian artery with resultant severe stenosis (series 8, image 282). Right carotid system: Right common carotid artery patent from its origin to the bifurcation without stenosis. Eccentric calcified plaque about the right bifurcation/proximal right ICA with associated stenosis of up to 60% by NASCET criteria. Right ICA irregular but otherwise patent to the skull base without stenosis, dissection, or occlusion. Left carotid system: Atheromatous plaque at the origin of the left common  carotid artery with mild stenosis. Left common carotid artery irregular but otherwise patent to the bifurcation without significant narrowing. Calcified plaque at the left bifurcation with associated stenosis of approximately 50% by NASCET criteria. Eccentric plaque distally within the mid and distal left ICA without high-grade stenosis. Left ICA patent to the skull base without dissection or occlusion. Vertebral arteries: Both of the vertebral arteries arise from the subclavian arteries. Right vertebral artery dominant with a diffusely hypoplastic left vertebral artery. Next plaque at the proximal right V1 segment with short-segment severe stenosis (series 9, image 130). Dominant right vertebral  artery otherwise patent to the skull base. Hypoplastic left vertebral artery is markedly irregular and attenuated with scant flow, essentially including by the V3 segment. Skeleton: No acute osseous abnormality. No worrisome lytic or blastic osseous lesions. Moderate cervical spondylolysis at C4-5 through C6-7. Other neck: No acute soft tissue abnormality within the neck. No adenopathy. Salivary glands and thyroid gland within normal limits. Upper chest: Visualized upper esophagus patulous with layering fluid within the esophageal lumen. Visualized upper chest and mediastinum demonstrate no other acute abnormality. Partially visualized lungs are grossly clear. Review of the MIP images confirms the above findings CTA HEAD FINDINGS Anterior circulation: Features left ICA patent. Extensive calcified atheromatous plaque throughout the cavernous and supraclinoid segments. Resultant moderate multifocal narrowing with a small more severe stenosis at the supraclinoid left ICA (series 8, image 106). ICA terminus narrowed but patent distally. Left M1 irregular but patent without high-grade stenosis. Focal moderate stenosis at the proximal left M2 superior division (series 10, image 134). Distally, there is occlusion of a left M3 branch (series 10, image 338), likely embolic. Distal left MCA branches otherwise perfused with demonstrate atheromatous irregularity. Petrous right ICA patent. Extensive atheromatous plaque throughout the cavernous/supraclinoid right ICA with moderate to severe multifocal narrowing. Right ICA terminus patent. Right M1 irregular but patent. Right MCA bifurcates early with focal moderate to severe stenoses at bifurcation. No proximal right M2 occlusion. Distal MCA branches patent with demonstrate multifocal atheromatous irregularity. A1 segments patent bilaterally. Normal anterior communicating artery. Multifocal moderate left A2 stenoses. Right A2 diminutive with multifocal/tandem severe stenoses.  Posterior circulation: Left vertebral artery essentially occluded at the skull base. Extensive atheromatous irregularity throughout the right V4 segment with moderate to severe multifocal stenoses. Right PICA patent proximally. Left PICA not visualized. Basilar overall diminutive with moderate diffuse narrowing. Superior cerebral arteries patent bilaterally. Both of the PCAs primarily supplied via the basilar. PCAs demonstrate extensive multifocal atheromatous irregularity but are patent to their distal aspects. Venous sinuses: Not well assessed due to arterial timing of the contrast bolus. Anatomic variants: None significant. Delayed phase: Not performed. Review of the MIP images confirms the above findings IMPRESSION: 1. Distal left M3 occlusion, likely embolic in nature. 2. Atheromatous stenoses about the carotid bifurcations with narrowing of up to 60% on the right and 50% on the left. 3. Severe extensive atheromatous change throughout the intracranial circulation, with most notable findings including severe left supraclinoid stenosis, multifocal severe right A2 stenoses, with extensive multifocal severe right V4 stenoses. Hypoplastic left vertebral artery occludes at the V3 segment. 4. Short-segment severe proximal right V1 stenosis. 5. Severe near occlusive stenosis of the mid right subclavian artery. Results discussed telephone at the time of interpretation on 05/19/2018 at approximately 3:00 am to Dr. Amie Portland. Electronically Signed   By: Jeannine Boga M.D.   On: 05/19/2018 04:10   Ct Head Code Stroke Wo Contrast  Result Date: 05/19/2018 CLINICAL DATA:  Code stroke.  Initial evaluation for acute aphasia. EXAM: CT HEAD WITHOUT CONTRAST TECHNIQUE: Contiguous axial images were obtained from the base of the skull through the vertex without intravenous contrast. COMPARISON:  Prior CT from 09/25/2017. FINDINGS: Brain: Age-related cerebral atrophy with chronic microvascular ischemic disease. No acute  intracranial hemorrhage. No acute large vessel territory infarct. No mass lesion, midline shift or mass effect. No hydrocephalus. No extra-axial fluid collection. Vascular: No hyperdense vessel. Calcified atherosclerosis at the skull base. Skull: Scalp soft tissues demonstrate no acute abnormality. Calvarium intact. Sinuses/Orbits: Globes and orbital soft tissues demonstrate no acute finding. Chronic right sphenoid sinusitis noted. Small right mastoid effusion. Other: None. ASPECTS [facility] Stroke Program Early CT Score) - Ganglionic level infarction (caudate, lentiform nuclei, internal capsule, insula, M1-M3 cortex): 7 - Supraganglionic infarction (M4-M6 cortex): 3 Total score (0-10 with 10 being normal): 10 IMPRESSION: 1. No acute intracranial infarct or other abnormality identified. 2. ASPECTS is 10. 3. Age-related cerebral atrophy with chronic small vessel ischemic disease, stable. These results were communicated to Rory Percy at 2:57 amon 6/25/2019by text page via the [facility] messaging system. Electronically Signed   By: Jeannine Boga M.D.   On: 05/19/2018 02:58    Microbiology: Recent Results (from the past 240 hour(s))  Urine culture     Status: Abnormal   Collection Time: 05/19/18  4:48 AM  Result Value Ref Range Status   Specimen Description URINE, CLEAN CATCH  Final   Special Requests NONE  Final   Culture (A)  Final    <10,000 COLONIES/mL >=100,000 COLONIES/mL Performed at [facility], [address], [place], [place] [address]    Report Status 05/20/2018 FINAL  Final     Labs: Basic Metabolic Panel: Recent Labs  Lab 05/19/18 0239 05/19/18 0242 05/19/18 1146 05/20/18 0445  NA 132* 133*  --  142  K 3.2* 3.2*  --  3.6  CL 95* 95*  --  106  CO2 24  --   --  27  GLUCOSE 249* 246*  --  154*  BUN 35* 34*  --  12  CREATININE 1.09* 1.00 0.83 0.66  CALCIUM 9.4  --   --  9.5   Liver Function Tests: Recent Labs  Lab 05/19/18 0239  AST 21  ALT 19  ALKPHOS 86  BILITOT  0.6  PROT 6.4*  ALBUMIN 3.5   No results for input(s): LIPASE, AMYLASE in the last 168 hours. No results for input(s): AMMONIA in the last 168 hours. CBC: Recent Labs  Lab 05/19/18 0239 05/19/18 0242 05/20/18 0445  WBC 8.6  --  6.4  NEUTROABS 5.3  --   --   HGB 12.1 12.6 12.3  HCT 38.4 37.0 38.1  MCV 84.0  --  82.8  PLT 274  --  288   Cardiac Enzymes: No results for input(s): CKTOTAL, CKMB, CKMBINDEX, TROPONINI in the last 168 hours. BNP: BNP (last 3 results) No results for input(s): BNP in the last 8760 hours.  ProBNP (last 3 results) No results for input(s): PROBNP in the last 8760 hours.  CBG: Recent Labs  Lab 05/19/18 1217 05/19/18 1535 05/19/18 2114 05/20/18 0602 05/20/18 1113  GLUCAP 201* 197* 279* 162* 217*       Signed:  Domenic Polite MD.  [facility] 05/20/2018, 4:46 PM

## 2018-05-20 NOTE — Progress Notes (Signed)
Pt and family given discharge instructions and summary. Summary signed and in chart. Verbalized understanding and able to teach back. No new questions or concerns.   IV and tele removed. Discharged from unit per staff in wheelchair. Family to transport home.

## 2018-05-21 ENCOUNTER — Other Ambulatory Visit: Payer: Self-pay | Admitting: *Deleted

## 2018-05-21 DIAGNOSIS — I10 Essential (primary) hypertension: Secondary | ICD-10-CM

## 2018-05-21 DIAGNOSIS — I69328 Other speech and language deficits following cerebral infarction: Secondary | ICD-10-CM | POA: Diagnosis not present

## 2018-05-21 DIAGNOSIS — E119 Type 2 diabetes mellitus without complications: Secondary | ICD-10-CM | POA: Diagnosis not present

## 2018-05-21 MED ORDER — LISINOPRIL 20 MG PO TABS
ORAL_TABLET | ORAL | 0 refills | Status: DC
Start: 2018-05-21 — End: 2018-11-03

## 2018-05-21 NOTE — Telephone Encounter (Signed)
Msg left on refill vm stating that the patient recently had a stroke but she is back at home now. Patient in need of a refill on lisinopril. [facility]. Call back number provided was [phone]. Thanks, MI

## 2018-05-21 NOTE — Patient Outreach (Signed)
Made aware of [facility] [place] [facility].  Telephone call made to Mrs. Banko to make aware [facility] Care [facility] will assist if pharmacy or transportation needs are identified while [facility] the [facility]. However, no answer. Left HIPPA compliant voicemail requesting call back.   Will send notification to [facility] office to make aware of [facility] enrollment.    Raiford NobleAtika Roxi Hlavaty, MSN-Ed, RN,BSN [facility] Care Management Hospital Liaison [phone]

## 2018-05-25 DIAGNOSIS — I69328 Other speech and language deficits following cerebral infarction: Secondary | ICD-10-CM | POA: Diagnosis not present

## 2018-05-25 DIAGNOSIS — E119 Type 2 diabetes mellitus without complications: Secondary | ICD-10-CM | POA: Diagnosis not present

## 2018-05-26 DIAGNOSIS — I69328 Other speech and language deficits following cerebral infarction: Secondary | ICD-10-CM | POA: Diagnosis not present

## 2018-05-26 DIAGNOSIS — E119 Type 2 diabetes mellitus without complications: Secondary | ICD-10-CM | POA: Diagnosis not present

## 2018-05-29 DIAGNOSIS — I69328 Other speech and language deficits following cerebral infarction: Secondary | ICD-10-CM | POA: Diagnosis not present

## 2018-05-29 DIAGNOSIS — E119 Type 2 diabetes mellitus without complications: Secondary | ICD-10-CM | POA: Diagnosis not present

## 2018-06-01 DIAGNOSIS — E119 Type 2 diabetes mellitus without complications: Secondary | ICD-10-CM | POA: Diagnosis not present

## 2018-06-01 DIAGNOSIS — I69328 Other speech and language deficits following cerebral infarction: Secondary | ICD-10-CM | POA: Diagnosis not present

## 2018-06-02 ENCOUNTER — Telehealth: Payer: Self-pay | Admitting: Physician Assistant

## 2018-06-02 DIAGNOSIS — I69328 Other speech and language deficits following cerebral infarction: Secondary | ICD-10-CM | POA: Diagnosis not present

## 2018-06-02 DIAGNOSIS — E119 Type 2 diabetes mellitus without complications: Secondary | ICD-10-CM | POA: Diagnosis not present
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
## 2018-06-03 DIAGNOSIS — E119 Type 2 diabetes mellitus without complications: Secondary | ICD-10-CM | POA: Diagnosis not present

## 2018-06-03 DIAGNOSIS — I69328 Other speech and language deficits following cerebral infarction: Secondary | ICD-10-CM | POA: Diagnosis not present

## 2018-06-04 DIAGNOSIS — I69328 Other speech and language deficits following cerebral infarction: Secondary | ICD-10-CM | POA: Diagnosis not present

## 2018-06-04 DIAGNOSIS — E119 Type 2 diabetes mellitus without complications: Secondary | ICD-10-CM | POA: Diagnosis not present

## 2018-06-04 NOTE — Telephone Encounter (Signed)
[facility]

## 2018-06-05 DIAGNOSIS — E119 Type 2 diabetes mellitus without complications: Secondary | ICD-10-CM | POA: Diagnosis not present

## 2018-06-05 DIAGNOSIS — I69328 Other speech and language deficits following cerebral infarction: Secondary | ICD-10-CM | POA: Diagnosis not present

## 2018-06-05 NOTE — Telephone Encounter (Signed)
Thank you :)

## 2018-06-09 DIAGNOSIS — E119 Type 2 diabetes mellitus without complications: Secondary | ICD-10-CM | POA: Diagnosis not present

## 2018-06-09 DIAGNOSIS — I69328 Other speech and language deficits following cerebral infarction: Secondary | ICD-10-CM | POA: Diagnosis not present

## 2018-06-10 ENCOUNTER — Telehealth: Payer: Self-pay | Admitting: Physician Assistant

## 2018-06-10 NOTE — Telephone Encounter (Signed)
Copied from CRM [id number]. Topic: Quick Communication - See Telephone Encounter >> Jun 10, 2018  3:13 PM Luanna Coleawoud, Jessica L wrote: CRM for notification. See Telephone encounter for: 06/10/18. Tresa EndoKelly from [facility] called and stated that husband refused PT for wife and stated that she needed to get pain injections from [facility] before they should start PT.

## 2018-06-10 NOTE — Telephone Encounter (Signed)
Copied from CRM [id number]. Topic: General - Other >> Jun 10, 2018  5:04 PM Debroah LoopLander, Lumin L wrote: Reason for CRM: Eunice Blaseebbie, with [facility] calling to notify PA McVey that the social work visit is postponed to 07/31 because the patient is not feeling well.

## 2018-06-11 DIAGNOSIS — I69328 Other speech and language deficits following cerebral infarction: Secondary | ICD-10-CM | POA: Diagnosis not present

## 2018-06-11 DIAGNOSIS — E119 Type 2 diabetes mellitus without complications: Secondary | ICD-10-CM | POA: Diagnosis not present

## 2018-06-16 DIAGNOSIS — E119 Type 2 diabetes mellitus without complications: Secondary | ICD-10-CM | POA: Diagnosis not present

## 2018-06-16 DIAGNOSIS — I69328 Other speech and language deficits following cerebral infarction: Secondary | ICD-10-CM | POA: Diagnosis not present

## 2018-06-18 DIAGNOSIS — I69328 Other speech and language deficits following cerebral infarction: Secondary | ICD-10-CM | POA: Diagnosis not present

## 2018-06-18 DIAGNOSIS — E119 Type 2 diabetes mellitus without complications: Secondary | ICD-10-CM | POA: Diagnosis not present

## 2018-06-23 ENCOUNTER — Other Ambulatory Visit: Payer: Self-pay | Admitting: Physician Assistant

## 2018-06-23 DIAGNOSIS — E119 Type 2 diabetes mellitus without complications: Secondary | ICD-10-CM | POA: Diagnosis not present

## 2018-06-23 DIAGNOSIS — I69328 Other speech and language deficits following cerebral infarction: Secondary | ICD-10-CM | POA: Diagnosis not present

## 2018-06-23 NOTE — Telephone Encounter (Signed)
Atorvastatin refill Last Refill:05/20/18 #30 by a different provider Last OV: 04/22/18 PCP: Osborne CascoWhitney Mcvey,PA [facility]

## 2018-06-23 NOTE — Telephone Encounter (Signed)
Copied from CRM [id number]. Topic: Quick Communication - Rx Refill/Question >> Jun 23, 2018  3:25 PM Raoul PitchWilliams-Neal, Sade R wrote: Medication: atorvastatin (LIPITOR) 40 MG tablet  Has the patient contacted their pharmacy? Yes  Preferred Pharmacy (with phone number or street name): [facility] [address] - [place], [place] - [id number] [phone] (Phone) [phone] (Fax)      Agent: Please be advised that RX refills may take up to 3 business days. We ask that you follow-up with your pharmacy.

## 2018-06-24 ENCOUNTER — Telehealth: Payer: Self-pay | Admitting: Family Medicine

## 2018-06-24 DIAGNOSIS — I69328 Other speech and language deficits following cerebral infarction: Secondary | ICD-10-CM | POA: Diagnosis not present

## 2018-06-24 DIAGNOSIS — E119 Type 2 diabetes mellitus without complications: Secondary | ICD-10-CM | POA: Diagnosis not present

## 2018-06-24 MED ORDER — ATORVASTATIN CALCIUM 40 MG PO TABS: 40.0000 mg | ORAL_TABLET | Freq: Every day | ORAL | 0 refills | Status: DC

## 2018-06-24 NOTE — Telephone Encounter (Signed)
Copied from CRM #[id number]. Topic: General - Other >> Jun 24, 2018  4:59 PM Stephannie LiSimmons, Latrise Bowland L, NT wrote: Reason for CRM Harlow MaresJoan Scott from [facility] called and would like to discuss follow up care for the patient from  [facility] they feel the she needs further assistance ,they are discharging her  please call Aurea GraffJoan at [phone] ,  to discuss

## 2018-06-24 NOTE — Telephone Encounter (Signed)
pls advise

## 2018-06-25 NOTE — Telephone Encounter (Signed)
[facility] completed short term care and referred her to [facility] for further/continuing care. Pt will receive in home care from [facility] ([facility] Will have home every other week for (possibly) the next 18 months, depending on the need of the pt. [name] is contact with [facility].

## 2018-07-01 ENCOUNTER — Ambulatory Visit: Payer: Medicare Other | Admitting: Physician Assistant

## 2018-07-02 ENCOUNTER — Inpatient Hospital Stay (HOSPITAL_COMMUNITY)
Admission: EM | Admit: 2018-07-02 | Discharge: 2018-07-05 | DRG: 309 | Disposition: A | Discharge status: Home Health | Payer: Medicare Other | Attending: Internal Medicine | Admitting: Internal Medicine

## 2018-07-02 ENCOUNTER — Emergency Department (HOSPITAL_COMMUNITY): Payer: Medicare Other

## 2018-07-02 ENCOUNTER — Other Ambulatory Visit: Payer: Self-pay

## 2018-07-02 ENCOUNTER — Ambulatory Visit: Payer: Self-pay | Admitting: Physician Assistant

## 2018-07-02 DIAGNOSIS — I2510 Atherosclerotic heart disease of native coronary artery without angina pectoris: Secondary | ICD-10-CM | POA: Diagnosis present

## 2018-07-02 DIAGNOSIS — R0902 Hypoxemia: Secondary | ICD-10-CM | POA: Diagnosis not present

## 2018-07-02 DIAGNOSIS — T45526A Underdosing of antithrombotic drugs, initial encounter: Secondary | ICD-10-CM | POA: Diagnosis present

## 2018-07-02 DIAGNOSIS — I110 Hypertensive heart disease with heart failure: Secondary | ICD-10-CM | POA: Diagnosis present

## 2018-07-02 DIAGNOSIS — I5032 Chronic diastolic (congestive) heart failure: Secondary | ICD-10-CM | POA: Diagnosis not present

## 2018-07-02 DIAGNOSIS — M879 Osteonecrosis, unspecified: Secondary | ICD-10-CM | POA: Diagnosis not present

## 2018-07-02 DIAGNOSIS — R000 Tachycardia, unspecified: Secondary | ICD-10-CM | POA: Diagnosis not present

## 2018-07-02 DIAGNOSIS — I4891 Unspecified atrial fibrillation: Secondary | ICD-10-CM | POA: Diagnosis not present

## 2018-07-02 DIAGNOSIS — Z881 Allergy status to other antibiotic agents status: Secondary | ICD-10-CM

## 2018-07-02 DIAGNOSIS — I160 Hypertensive urgency: Secondary | ICD-10-CM | POA: Diagnosis not present

## 2018-07-02 DIAGNOSIS — Z794 Long term (current) use of insulin: Secondary | ICD-10-CM | POA: Diagnosis not present

## 2018-07-02 DIAGNOSIS — I69328 Other speech and language deficits following cerebral infarction: Secondary | ICD-10-CM | POA: Diagnosis not present

## 2018-07-02 DIAGNOSIS — Y92009 Unspecified place in unspecified non-institutional (private) residence as the place of occurrence of the external cause: Secondary | ICD-10-CM

## 2018-07-02 DIAGNOSIS — Z91128 Patient's intentional underdosing of medication regimen for other reason: Secondary | ICD-10-CM

## 2018-07-02 DIAGNOSIS — I639 Cerebral infarction, unspecified: Secondary | ICD-10-CM | POA: Diagnosis not present

## 2018-07-02 DIAGNOSIS — R4701 Aphasia: Secondary | ICD-10-CM | POA: Diagnosis present

## 2018-07-02 DIAGNOSIS — G894 Chronic pain syndrome: Secondary | ICD-10-CM | POA: Diagnosis present

## 2018-07-02 DIAGNOSIS — Z7902 Long term (current) use of antithrombotics/antiplatelets: Secondary | ICD-10-CM

## 2018-07-02 DIAGNOSIS — I499 Cardiac arrhythmia, unspecified: Secondary | ICD-10-CM | POA: Diagnosis not present

## 2018-07-02 DIAGNOSIS — R531 Weakness: Secondary | ICD-10-CM | POA: Diagnosis not present

## 2018-07-02 DIAGNOSIS — Z79890 Hormone replacement therapy (postmenopausal): Secondary | ICD-10-CM

## 2018-07-02 DIAGNOSIS — E039 Hypothyroidism, unspecified: Secondary | ICD-10-CM | POA: Diagnosis not present

## 2018-07-02 DIAGNOSIS — N179 Acute kidney failure, unspecified: Secondary | ICD-10-CM

## 2018-07-02 DIAGNOSIS — Z885 Allergy status to narcotic agent status: Secondary | ICD-10-CM

## 2018-07-02 DIAGNOSIS — G4710 Hypersomnia, unspecified: Secondary | ICD-10-CM | POA: Diagnosis not present

## 2018-07-02 DIAGNOSIS — I69320 Aphasia following cerebral infarction: Secondary | ICD-10-CM

## 2018-07-02 DIAGNOSIS — R748 Abnormal levels of other serum enzymes: Secondary | ICD-10-CM | POA: Diagnosis not present

## 2018-07-02 DIAGNOSIS — I69322 Dysarthria following cerebral infarction: Secondary | ICD-10-CM

## 2018-07-02 DIAGNOSIS — E119 Type 2 diabetes mellitus without complications: Secondary | ICD-10-CM

## 2018-07-02 DIAGNOSIS — R739 Hyperglycemia, unspecified: Secondary | ICD-10-CM

## 2018-07-02 DIAGNOSIS — I482 Chronic atrial fibrillation, unspecified: Secondary | ICD-10-CM | POA: Diagnosis present

## 2018-07-02 DIAGNOSIS — Z888 Allergy status to other drugs, medicaments and biological substances status: Secondary | ICD-10-CM

## 2018-07-02 DIAGNOSIS — F419 Anxiety disorder, unspecified: Secondary | ICD-10-CM | POA: Diagnosis present

## 2018-07-02 DIAGNOSIS — E1159 Type 2 diabetes mellitus with other circulatory complications: Secondary | ICD-10-CM | POA: Diagnosis present

## 2018-07-02 DIAGNOSIS — E785 Hyperlipidemia, unspecified: Secondary | ICD-10-CM | POA: Diagnosis present

## 2018-07-02 DIAGNOSIS — E1165 Type 2 diabetes mellitus with hyperglycemia: Secondary | ICD-10-CM | POA: Diagnosis present

## 2018-07-02 DIAGNOSIS — I10 Essential (primary) hypertension: Secondary | ICD-10-CM | POA: Diagnosis present

## 2018-07-02 DIAGNOSIS — Z833 Family history of diabetes mellitus: Secondary | ICD-10-CM

## 2018-07-02 DIAGNOSIS — Z955 Presence of coronary angioplasty implant and graft: Secondary | ICD-10-CM

## 2018-07-02 DIAGNOSIS — Z79899 Other long term (current) drug therapy: Secondary | ICD-10-CM

## 2018-07-02 DIAGNOSIS — Z905 Acquired absence of kidney: Secondary | ICD-10-CM

## 2018-07-02 DIAGNOSIS — I69392 Facial weakness following cerebral infarction: Secondary | ICD-10-CM

## 2018-07-02 DIAGNOSIS — Z8249 Family history of ischemic heart disease and other diseases of the circulatory system: Secondary | ICD-10-CM

## 2018-07-02 DIAGNOSIS — I700 Atherosclerosis of aorta: Secondary | ICD-10-CM | POA: Diagnosis present

## 2018-07-02 DIAGNOSIS — I672 Cerebral atherosclerosis: Secondary | ICD-10-CM | POA: Diagnosis present

## 2018-07-02 DIAGNOSIS — I513 Intracardiac thrombosis, not elsewhere classified: Secondary | ICD-10-CM | POA: Diagnosis present

## 2018-07-02 LAB — RAPID URINE DRUG SCREEN, HOSP PERFORMED
AMPHETAMINES: NOT DETECTED
Barbiturates: NOT DETECTED
Benzodiazepines: POSITIVE — AB
COCAINE: NOT DETECTED
OPIATES: POSITIVE — AB
TETRAHYDROCANNABINOL: NOT DETECTED

## 2018-07-02 LAB — COMPREHENSIVE METABOLIC PANEL
ALT: 20 U/L (ref 0–44)
AST: 27 U/L (ref 15–41)
Albumin: 3.5 g/dL (ref 3.5–5.0)
Alkaline Phosphatase: 94 U/L (ref 38–126)
Anion gap: 13 (ref 5–15)
BILIRUBIN TOTAL: 0.5 mg/dL (ref 0.3–1.2)
BUN: 23 mg/dL (ref 8–23)
CO2: 19 mmol/L — ABNORMAL LOW (ref 22–32)
Calcium: 9.3 mg/dL (ref 8.9–10.3)
Chloride: 102 mmol/L (ref 98–111)
Creatinine, Ser: 1.04 mg/dL — ABNORMAL HIGH (ref 0.44–1.00)
GFR, EST AFRICAN AMERICAN: 58 mL/min — AB (ref >60)
GFR, EST NON AFRICAN AMERICAN: 50 mL/min — AB (ref >60)
Glucose, Bld: 327 mg/dL — ABNORMAL HIGH (ref 70–99)
POTASSIUM: 3.5 mmol/L (ref 3.5–5.1)
Sodium: 134 mmol/L — ABNORMAL LOW (ref 135–145)
Total Protein: 6.2 g/dL — ABNORMAL LOW (ref 6.5–8.1)

## 2018-07-02 LAB — URINALYSIS, ROUTINE W REFLEX MICROSCOPIC
BILIRUBIN URINE: NEGATIVE
Glucose, UA: 150 mg/dL — AB
Ketones, ur: NEGATIVE mg/dL
NITRITE: NEGATIVE
PH: 5 (ref 5.0–8.0)
Protein, ur: NEGATIVE mg/dL
Specific Gravity, Urine: 1.017 (ref 1.005–1.030)

## 2018-07-02 LAB — TROPONIN I: Troponin I: <0.03 ng/mL (ref <0.03)

## 2018-07-02 LAB — CBC
HCT: 39.8 % (ref 36.0–46.0)
Hemoglobin: 12.6 g/dL (ref 12.0–15.0)
MCH: 27.3 pg (ref 26.0–34.0)
MCHC: 31.7 g/dL (ref 30.0–36.0)
MCV: 86.1 fL (ref 78.0–100.0)
PLATELETS: 243 10*3/uL (ref 150–400)
RBC: 4.62 MIL/uL (ref 3.87–5.11)
RDW: 14.3 % (ref 11.5–15.5)
WBC: 9.9 10*3/uL (ref 4.0–10.5)

## 2018-07-02 MED ORDER — METOPROLOL TARTRATE 5 MG/5ML IV SOLN
5.0000 mg | INTRAVENOUS | Status: DC | PRN
Start: 2018-07-02 — End: 2018-07-05

## 2018-07-02 MED ORDER — ATENOLOL 25 MG PO TABS
25.0000 mg | ORAL_TABLET | Freq: Once | ORAL | Status: AC
  Administered 2018-07-02: 25 mg via ORAL
  Filled 2018-07-02: qty 1

## 2018-07-02 MED ORDER — ENOXAPARIN SODIUM 40 MG/0.4ML ~~LOC~~ SOLN: 40.0000 mg | SUBCUTANEOUS | Status: DC

## 2018-07-02 MED ORDER — ONDANSETRON HCL 4 MG/2ML IJ SOLN: 4.0000 mg | Freq: Four times a day (QID) | INTRAMUSCULAR | Status: DC | PRN

## 2018-07-02 MED ORDER — ZOLPIDEM TARTRATE 5 MG PO TABS
5.0000 mg | ORAL_TABLET | Freq: Every evening | ORAL | Status: DC | PRN
  Administered 2018-07-03 – 2018-07-04 (×2): 5 mg via ORAL
  Filled 2018-07-02 (×2): qty 1

## 2018-07-02 MED ORDER — ACETAMINOPHEN 325 MG PO TABS: 650.0000 mg | ORAL_TABLET | ORAL | Status: DC | PRN

## 2018-07-02 MED ORDER — HYDRALAZINE HCL 20 MG/ML IJ SOLN
5.0000 mg | INTRAMUSCULAR | Status: DC | PRN
  Administered 2018-07-03: 5 mg via INTRAVENOUS
  Filled 2018-07-02: qty 1

## 2018-07-02 MED ORDER — INSULIN ASPART 100 UNIT/ML ~~LOC~~ SOLN
0.0000 [IU] | Freq: Three times a day (TID) | SUBCUTANEOUS | Status: DC
  Administered 2018-07-03: 2 [IU] via SUBCUTANEOUS
  Administered 2018-07-03: 1 [IU] via SUBCUTANEOUS
  Administered 2018-07-04 (×2): 2 [IU] via SUBCUTANEOUS
  Administered 2018-07-04: 3 [IU] via SUBCUTANEOUS
  Administered 2018-07-05: 2 [IU] via SUBCUTANEOUS
  Administered 2018-07-05: 5 [IU] via SUBCUTANEOUS

## 2018-07-02 NOTE — ED Provider Notes (Signed)
[facility] EMERGENCY DEPARTMENT Provider Note   CSN: [id number] Arrival date & time: 07/02/18  1628     History   Chief Complaint Chief Complaint  Patient presents with  . Atrial Fibrillation  . Hypertension    HPI [name] is a 77 y.o. female who presents with A.fib with RVR and HTN. PMH significant for CAD, HTN, HLD, recent ischemic CVA with residual speech deficits and R facial droop, Bell's palsy, chronic pain syndrome. She states that she has had N/V/D for the past several days. It resolved a couple days ago. Yesterday and today she felt fine.  Today a home health nurse came out to see her and her manual blood pressures were elevated.  She also noted that her heart rate was elevated and was irregular.  She does not have hx of A.fib. The patient denied any symptoms to the home health nurse.  Due to her abnormal vital signs they were recommended to go to the emergency department for an evaluation.  Patient at this time denies fever, chills, chest pain, abdominal pain, nausea, vomiting, diarrhea, urinary symptoms.  She denies headache, loss of consciousness, vision changes, unilateral weakness, numbness or tingling.  HPI  Past Medical History:  Diagnosis Date  . Abdominal discomfort    "due to medication intolerance"  . CAD (coronary artery disease)    previous stent  . Chronic pain syndrome 09/10/2013   Dr Nelva Bush,   . [place] Patient virus infection 1988  . Excessive daytime sleepiness 12/05/2014   Patient has not been seen for a sleep study as ordered, and used adderall to keep alert in daytime.  She agreed  In a contract not to have any scheduled medication for pain treatment from [facility] and will not receive refills for Adderall, which was initiated by dr Orland Penman, PCP>   . Fall   . Hyperlipemia   . Hypersomnia, persistent 09/10/2013   Patient on  Stimulants .  Marland Kitchen Hypertension   . Narcotic addiction ([place]) 12/05/2014  . Obesity, unspecified 09/10/2013  .  Shoulder joint pain    both shoulders    Patient Active Problem List   Diagnosis Date Noted  . Acute CVA (cerebrovascular accident) ([place]) 05/19/2018  . Aphasia   . Dehydration 03/17/2018  . Generalized weakness 03/17/2018  . Near syncope 03/17/2018  . ARF (acute renal failure) ([place]) 03/16/2018  . Hyperglycemia 09/25/2017  . Type 2 diabetes mellitus without complication, with long-term current use of insulin ([place]) 04/24/2017  . ADD (attention deficit disorder) 12/30/2016  . Narcotic addiction ([place]) 12/05/2014  . Excessive daytime sleepiness 12/05/2014  . Alteration in psychomotor activity 12/05/2014  . Depression with somatization 12/05/2014  . Hypersomnia, persistent 09/10/2013  . Obesity, unspecified 09/10/2013  . Chronic pain syndrome 09/10/2013  . Depression 07/31/2013  . Medication intolerance 07/19/2013  . Anxiety 07/19/2013  . CAD (coronary artery disease) status post stent to distal right coronary artery 2006 06/20/2013  . Neurocardiogenic syncope 06/20/2013  . Essential hypertension 06/20/2013  . Hyperlipidemia 06/20/2013    Past Surgical History:  Procedure Laterality Date  . ANGIOPLASTY  03/01/2002   cutting balloon mid RCA  . BACK SURGERY    . CARDIAC CATHETERIZATION  01/26/2007   mild diffuse CAD  . CARDIAC CATHETERIZATION  10/23/2009   nonobstructive CAD,60% prox RCA,50% prox LAD, 50% mid ramus  . CORONARY STENT PLACEMENT  03/15/2005   distal RCA  . NEPHRECTOMY       OB History    Gravida  2   Para  2   Term  2   Preterm      AB      Living  2     SAB      TAB      Ectopic      Multiple      Live Births               Home Medications    Prior to Admission medications   Medication Sig Start Date End Date Taking? Authorizing Provider  ACCU-CHEK SOFTCLIX LANCETS lancets USE TO TEST FOUR TIMES DAILY 04/29/18   McVey, Gelene Mink, PA-C  Alfalfa 250 MG TABS Take 1 tablet by mouth every morning.    [provider]    amphetamine-dextroamphetamine (ADDERALL) 20 MG tablet Take 1 tablet (20 mg total) by mouth 2 (two) times daily as needed. Patient taking differently: Take 20 mg by mouth 2 (two) times daily as needed (chronic fatigue syndrome).  02/27/18 03/29/18  McVey, Gelene Mink, PA-C  atenolol (TENORMIN) 25 MG tablet TAKE 1 TABLET BY MOUTH IN THE MORNING AND 2 IN THE EVENING 04/07/18   McVey, Gelene Mink, PA-C  atorvastatin (LIPITOR) 40 MG tablet Take 1 tablet (40 mg total) by mouth daily at 6 PM. 06/24/18   McVey, Gelene Mink, PA-C  blood glucose meter kit and supplies Dispense based on patient and insurance preference. Use up to four times daily as directed. (FOR ICD-9 250.00, 250.01). 09/27/17   Oswald Hillock, MD  Cholecalciferol (VITAMIN D3) 5000 units TBDP Take 1 capsule by mouth daily.    [provider]  CHROMIUM ASPARTATE PO Take 1 tablet by mouth every morning.    [provider]  Cinnamon Bark POWD Take 1 Dose by mouth daily.    [provider]  clopidogrel (PLAVIX) 75 MG tablet Take 1 tablet (75 mg total) by mouth daily. 05/21/18   Domenic Polite, MD  DHA-EPA-Coenzyme Q10-Vitamin E (GNP COQ-10 & FISH OIL) 120-180-50-30 CAPS Take 3 tablets by mouth daily.     [provider]  diazepam (VALIUM) 5 MG tablet TAKE 1 TABLET BY MOUTH EVERY 12 HOURS AS NEEDED FOR ANXIETY 04/07/18   McVey, Gelene Mink, PA-C  erythromycin ophthalmic ointment Place 1 application into the right eye 2 (two) times daily. 05/06/18   [provider]  fexofenadine (ALLEGRA) 180 MG tablet Take 180 mg by mouth daily as needed for allergies.     [provider]  fluorometholone (FML) 0.1 % ophthalmic suspension Place 1 drop into the right eye 4 (four) times daily. 05/05/18   [provider]  glucose 4 GM chewable tablet Chew 1 tablet by mouth as needed for low blood sugar.    [provider]  glucose blood (ACCU-CHEK AVIVA PLUS) test strip USE TO TEST  FOUR TIMES DAILY 12/02/17   McVey, Gelene Mink, PA-C  hydrochlorothiazide (HYDRODIURIL) 25 MG tablet Take 25 mg by mouth daily.    [provider]  HYDROcodone-acetaminophen (NORCO) 10-325 MG per tablet Take 1-2 tablets by mouth every 4 (four) hours as needed for moderate pain.  09/28/14   [provider]  Insulin Glargine (LANTUS SOLOSTAR) 100 UNIT/ML Solostar Pen Inject 24 Units into the skin daily at 10 pm. 10/24/17   McVey, Gelene Mink, PA-C  Insulin Syringe-Needle U-100 (INSULIN SYRINGE .5CC/31GX5/16") 31G X 5/16" 0.5 ML MISC Use as directed 09/27/17   Oswald Hillock, MD  levothyroxine (SYNTHROID, LEVOTHROID) 50 MCG tablet Take 1 tablet (50  mcg total) by mouth daily before breakfast. 03/07/17   McVey, Gelene Mink, PA-C  lisinopril (PRINIVIL,ZESTRIL) 20 MG tablet TAKE 1 TABLET BY MOUTH IN THE MORNING AND 1/2 (ONE-HALF) AT BEDTIME PLEASE SCHEDULE AN APPT FOR FUTURE REFILLS 05/21/18   Croitoru, Mihai, MD  magnesium oxide (MAG-OX) 400 MG tablet Take 400 mg by mouth 2 (two) times daily.    [provider]  Misc. Devices (HUGO ROLLING WALKER ELITE) MISC 1 Units by Does not apply route daily as needed. 11/29/16   McVey, Gelene Mink, PA-C  potassium chloride (K-DUR) 10 MEQ tablet Take 10 mEq by mouth daily. 05/06/18   [provider]  venlafaxine (EFFEXOR) 100 MG tablet TAKE 1 TABLET BY MOUTH ONCE DAILY 05/06/18   McVey, Gelene Mink, PA-C    Family History Family History  Problem Relation Age of Onset  . Hypertension Mother   . Diabetes Father   . Heart attack Father   . Heart attack Brother     Social History Social History   Tobacco Use  . Smoking status: Never Smoker  . Smokeless tobacco: Never Used  Substance Use Topics  . Alcohol use: No  . Drug use: No     Allergies   Azithromycin; Cardizem [diltiazem hcl]; Codeine; Coreg [carvedilol]; Demerol [meperidine]; Erythromycin; Inderal [propranolol]; Procardia [nifedipine];  Wellbutrin [bupropion]; and Zoloft [sertraline hcl]   Review of Systems Review of Systems  Constitutional: Negative for fever.  Respiratory: Negative for shortness of breath.   Cardiovascular: Negative for chest pain.  Gastrointestinal: Positive for diarrhea (resolved), nausea (resolved) and vomiting (resolved). Negative for abdominal pain.  Genitourinary: Negative for dysuria.  Neurological: Positive for weakness (facial weakness (R)). Negative for syncope and headaches.  All other systems reviewed and are negative.    Physical Exam Updated Vital Signs BP 115/72 (BP Location: Left Arm)   Pulse (!) 131   Temp (!) 97.4 F (36.3 C) (Axillary)   Resp 18   SpO2 97%   Physical Exam  Constitutional: She is oriented to person, place, and time. She appears well-developed and well-nourished. No distress.  Calm, cooperative. Pleasant. Wearing a wig  HENT:  Head: Normocephalic and atraumatic.  Eyes: Pupils are equal, round, and reactive to light. Conjunctivae are normal. Right eye exhibits no discharge. Left eye exhibits no discharge. No scleral icterus.  Neck: Normal range of motion.  Cardiovascular: A regularly irregular rhythm present. Tachycardia present. Exam reveals no gallop and no friction rub.  No murmur heard. Pulmonary/Chest: Effort normal and breath sounds normal. No respiratory distress.  Abdominal: Soft. Bowel sounds are normal. She exhibits no distension. There is no tenderness.  Musculoskeletal:  No peripheral edema  Neurological: She is alert and oriented to person, place, and time.  Right sided facial droop. Dysarthria and some trouble with word finding  Skin: Skin is warm and dry.  Psychiatric: She has a normal mood and affect. Her behavior is normal.  Nursing note and vitals reviewed.    ED Treatments / Results  Labs (all labs ordered are listed, but only abnormal results are displayed) Labs Reviewed  COMPREHENSIVE METABOLIC PANEL - Abnormal; Notable for the  following components:      Result Value   Sodium 134 (*)    CO2 19 (*)    Glucose, Bld 327 (*)    Creatinine, Ser 1.04 (*)    Total Protein 6.2 (*)    GFR calc non Af Amer 50 (*)    GFR calc Af Amer 58 (*)  All other components within normal limits  URINALYSIS, ROUTINE W REFLEX MICROSCOPIC - Abnormal; Notable for the following components:   Glucose, UA 150 (*)    Hgb urine dipstick SMALL (*)    Leukocytes, UA MODERATE (*)    Bacteria, UA RARE (*)    All other components within normal limits  RAPID URINE DRUG SCREEN, HOSP PERFORMED - Abnormal; Notable for the following components:   Opiates POSITIVE (*)    Benzodiazepines POSITIVE (*)    All other components within normal limits  URINE CULTURE  CBC  TROPONIN I  TSH  T3, FREE  T4, FREE  TROPONIN I  TROPONIN I  TROPONIN I  BRAIN NATRIURETIC PEPTIDE  BASIC METABOLIC PANEL  CBC    EKG EKG Interpretation  Date/Time:  Thursday July 02 2018 16:47:43 EDT Ventricular Rate:  133 PR Interval:    QRS Duration: 85 QT Interval:  297 QTC Calculation: 442 R Axis:   60 Text Interpretation:  Atrial fibrillation Ventricular premature complex Low voltage, precordial leads Repolarization abnormality, prob rate related Confirmed by Fredia Sorrow [phone]) on 07/02/2018 4:54:58 PM   Radiology Dg Chest 2 View  Result Date: 07/02/2018 CLINICAL DATA:  Hypertension and atrial fibrillation EXAM: CHEST - 2 VIEW COMPARISON:  January 09, 2018 FINDINGS: No edema or consolidation. The heart size and pulmonary vascularity are normal. No adenopathy. There is aortic atherosclerosis. There is degenerative change in each shoulder with suspected avascular necrosis in the superior right humeral head region. IMPRESSION: No edema or consolidation. Stable cardiac silhouette. There is aortic atherosclerosis. Suspect a degree of avascular necrosis in the superior right humeral head. Aortic Atherosclerosis (ICD10-I70.0). Electronically Signed   By: Lowella Grip III M.D.   On: 07/02/2018 17:44    Procedures Procedures (including critical care time)  Medications Ordered in ED Medications  atenolol (TENORMIN) tablet 25 mg (25 mg Oral Given 07/02/18 1737)     Initial Impression / Assessment and Plan / ED Course  I have reviewed the triage vital signs and the nursing notes.  Pertinent labs & imaging results that were available during my care of the patient were reviewed by me and considered in my medical decision making (see chart for details).  77 year old female presents with new onset A. fib with RVR.  Heart rate is ranging from 110-130s.  She is hypertensive as well.  Other vital signs are normal.  EKG confirms atrial fibrillation.  On exam she has baseline facial droop on the right and dysarthria from her prior stroke.  The patient denies any acute complaints.  She states her nausea, vomiting, diarrhea from earlier in the week have resolved.  Will obtain labs, troponin, UA.  We will give her her home dose of atenolol and assess for improvement.  After several hours of monitoring she is still tachycardic.  She has an allergy of nausea and vomiting to diltiazem.  CBC is normal.  CMP is remarkable for hyperglycemia (327).  She has a mild AKI as well.  Anion gap is normal.  Troponin is normal.  Urine is normal will discuss with hospitalist for admission.  9:45 PM Spoke with Dr. Blaine Hamper who will admit. He request cardiology consult.  Cardiology will consult.   Final Clinical Impressions(s) / ED Diagnoses   Final diagnoses:  Atrial fibrillation with RVR ([place])  AKI (acute kidney injury) [facility])  Hyperglycemia    ED Discharge Orders    None       Recardo Evangelist, PA-C 07/03/18 0004  Fredia Sorrow, MD 07/04/18 [phone]

## 2018-07-02 NOTE — ED Notes (Signed)
Pt given ice water.

## 2018-07-02 NOTE — ED Triage Notes (Signed)
Per [facility], Pt from home. Pt had home health visit. Home health nurse called due to HTN and A-fib. Pt has been out of her medication for 2 weeks, including plavix. Pt reports having a CVA in June with aphasia. Pt reports having N/V/D x 1 week, feeling tired recently. Pt denies CP.  EMS reports pt's BP 218/102 with HR 120-160. CBG- 299.

## 2018-07-02 NOTE — ED Notes (Signed)
Patient given turkey sandwich and water

## 2018-07-02 NOTE — Consult Note (Signed)
Cardiology Consultation Note    Patient ID: [name], MRN: [id number], DOB/AGE: August 09, 1941 77 y.o. Admit date: 07/02/2018   Date of Consult: 07/02/2018 Primary Physician: Dorise Hiss, PA-C Primary Cardiologist: Croitoru  Reason for Consultation: AF Requesting MD: Blaine Hamper  HPI: [name] is a 77 y.o. female with a history of recent ischemic stroke with residual slurred speech and word finding difficulty (04/2018), reported history of coronary artery disease, chronic pain syndrome, who presents with a rapid irregular heart rate and elevated blood pressures.  The patient was admitted at the end of June with slurred speech and word finding difficulty, which was concerning for acute stroke.  A head CTA showed an acute left M3 occlusion.  She was unable to undergo an MRI due to pre-existing hardware.  She was discharged on Plavix.  She has had significant residual slurred speech and continues to have difficulty finding words.  Otherwise, she had been doing reasonably well until a few days ago, which she had significant nausea, vomiting, and diarrhea.  This resolved over the last 1 to 2 days.  She was visited by home health nurse today, who noted that she had elevated irregular heart rates, concerning for atrial fibrillation.  She was also markedly hypertensive.  She was sent to the [facility] ED for further evaluation.  In the ED, her blood pressures were as high as 180s over 90s, and her heart rates were in 130s to 140s in atrial fibrillation.  Labs were otherwise unremarkable, including a negative initial troponin.  Her creatinine was 1.0.  ECG showed atrial fibrillation, and possible Ashman's phenomenon.  She was given IV metoprolol with some improvement in her heart rates.  Upon my evaluation, her heart rates were in the 100s to 110s.  She denied chest pains, palpitations, shortness of breath, PND, lower extremity edema, or other cardiovascular symptoms.  Past Medical History:    Diagnosis Date  . Abdominal discomfort    "due to medication intolerance"  . CAD (coronary artery disease)    previous stent  . Chronic pain syndrome 09/10/2013   Dr Nelva Bush,   . [place] Patient virus infection 1988  . Excessive daytime sleepiness 12/05/2014   Patient has not been seen for a sleep study as ordered, and used adderall to keep alert in daytime.  She agreed  In a contract not to have any scheduled medication for pain treatment from [facility] and will not receive refills for Adderall, which was initiated by dr Orland Penman, PCP>   . Fall   . Hyperlipemia   . Hypersomnia, persistent 09/10/2013   Patient on  Stimulants .  Marland Kitchen Hypertension   . Narcotic addiction ([place]) 12/05/2014  . Obesity, unspecified 09/10/2013  . Shoulder joint pain    both shoulders      Surgical History:  Past Surgical History:  Procedure Laterality Date  . ANGIOPLASTY  03/01/2002   cutting balloon mid RCA  . BACK SURGERY    . CARDIAC CATHETERIZATION  01/26/2007   mild diffuse CAD  . CARDIAC CATHETERIZATION  10/23/2009   nonobstructive CAD,60% prox RCA,50% prox LAD, 50% mid ramus  . CORONARY STENT PLACEMENT  03/15/2005   distal RCA  . NEPHRECTOMY       Home Meds: Prior to Admission medications   Medication Sig Start Date End Date Taking? Authorizing Provider  ACCU-CHEK SOFTCLIX LANCETS lancets USE TO TEST FOUR TIMES DAILY 04/29/18   McVey, Gelene Mink, PA-C  Alfalfa 250 MG TABS Take 1 tablet by  mouth every morning.    [provider]  amphetamine-dextroamphetamine (ADDERALL) 20 MG tablet Take 1 tablet (20 mg total) by mouth 2 (two) times daily as needed. Patient taking differently: Take 20 mg by mouth 2 (two) times daily as needed (chronic fatigue syndrome).  02/27/18 03/29/18  McVey, Gelene Mink, PA-C  atenolol (TENORMIN) 25 MG tablet TAKE 1 TABLET BY MOUTH IN THE MORNING AND 2 IN THE EVENING 04/07/18   McVey, Gelene Mink, PA-C  atorvastatin (LIPITOR) 40 MG tablet Take 1 tablet (40 mg total) by  mouth daily at 6 PM. 06/24/18   McVey, Gelene Mink, PA-C  blood glucose meter kit and supplies Dispense based on patient and insurance preference. Use up to four times daily as directed. (FOR ICD-9 250.00, 250.01). 09/27/17   Oswald Hillock, MD  Cholecalciferol (VITAMIN D3) 5000 units TBDP Take 1 capsule by mouth daily.    [provider]  CHROMIUM ASPARTATE PO Take 1 tablet by mouth every morning.    [provider]  Cinnamon Bark POWD Take 1 Dose by mouth daily.    [provider]  clopidogrel (PLAVIX) 75 MG tablet Take 1 tablet (75 mg total) by mouth daily. 05/21/18   Domenic Polite, MD  DHA-EPA-Coenzyme Q10-Vitamin E (GNP COQ-10 & FISH OIL) 120-180-50-30 CAPS Take 3 tablets by mouth daily.     [provider]  diazepam (VALIUM) 5 MG tablet TAKE 1 TABLET BY MOUTH EVERY 12 HOURS AS NEEDED FOR ANXIETY 04/07/18   McVey, Gelene Mink, PA-C  erythromycin ophthalmic ointment Place 1 application into the right eye 2 (two) times daily. 05/06/18   [provider]  fexofenadine (ALLEGRA) 180 MG tablet Take 180 mg by mouth daily as needed for allergies.     [provider]  fluorometholone (FML) 0.1 % ophthalmic suspension Place 1 drop into the right eye 4 (four) times daily. 05/05/18   [provider]  glucose 4 GM chewable tablet Chew 1 tablet by mouth as needed for low blood sugar.    [provider]  glucose blood (ACCU-CHEK AVIVA PLUS) test strip USE TO TEST FOUR TIMES DAILY 12/02/17   McVey, Gelene Mink, PA-C  hydrochlorothiazide (HYDRODIURIL) 25 MG tablet Take 25 mg by mouth daily.    [provider]  HYDROcodone-acetaminophen (NORCO) 10-325 MG per tablet Take 1-2 tablets by mouth every 4 (four) hours as needed for moderate pain.  09/28/14   [provider]  Insulin Glargine (LANTUS SOLOSTAR) 100 UNIT/ML Solostar Pen Inject 24 Units into the skin daily at 10 pm. 10/24/17   McVey, Gelene Mink, PA-C    Insulin Syringe-Needle U-100 (INSULIN SYRINGE .5CC/31GX5/16") 31G X 5/16" 0.5 ML MISC Use as directed 09/27/17   Oswald Hillock, MD  levothyroxine (SYNTHROID, LEVOTHROID) 50 MCG tablet Take 1 tablet (50 mcg total) by mouth daily before breakfast. 03/07/17   McVey, Gelene Mink, PA-C  lisinopril (PRINIVIL,ZESTRIL) 20 MG tablet TAKE 1 TABLET BY MOUTH IN THE MORNING AND 1/2 (ONE-HALF) AT BEDTIME PLEASE SCHEDULE AN APPT FOR FUTURE REFILLS 05/21/18   Croitoru, Mihai, MD  magnesium oxide (MAG-OX) 400 MG tablet Take 400 mg by mouth 2 (two) times daily.    [provider]  Misc. Devices (HUGO ROLLING WALKER ELITE) MISC 1 Units by Does not apply route daily as needed. 11/29/16   McVey, Gelene Mink, PA-C  potassium chloride (K-DUR) 10 MEQ tablet Take 10 mEq by mouth daily. 05/06/18   [provider]  venlafaxine (EFFEXOR) 100 MG tablet TAKE  1 TABLET BY MOUTH ONCE DAILY 05/06/18   McVey, Gelene Mink, PA-C    Inpatient Medications:  . [START ON 07/03/2018] enoxaparin (LOVENOX) injection  40 mg Subcutaneous Q24H  . [START ON 07/03/2018] insulin aspart  0-9 Units Subcutaneous TID WC     Allergies:  Allergies  Allergen Reactions  . Azithromycin Nausea And Vomiting  . Cardizem [Diltiazem Hcl] Nausea And Vomiting  . Codeine Other (See Comments)    Feeling weird   . Coreg [Carvedilol] Nausea And Vomiting  . Demerol [Meperidine] Other (See Comments)    Per pt disoriented  . Erythromycin Nausea And Vomiting    Very sick  . Inderal [Propranolol] Other (See Comments)    Made me cry  . Procardia [Nifedipine] Other (See Comments)    Disoriented, sick  . Wellbutrin [Bupropion] Other (See Comments)    Unknown reaction  . Zoloft [Sertraline Hcl] Other (See Comments)    Could not talk, stiff    Social History   Socioeconomic History  . Marital status: Married    Spouse name: Not on file  . Number of children: Not on file  . Years of education: Not on file  . Highest  education level: Not on file  Occupational History  . Occupation: N/A  Social Needs  . Financial resource strain: Not on file  . Food insecurity:    Worry: Not on file    Inability: Not on file  . Transportation needs:    Medical: Not on file    Non-medical: Not on file  Tobacco Use  . Smoking status: Never Smoker  . Smokeless tobacco: Never Used  Substance and Sexual Activity  . Alcohol use: No  . Drug use: No  . Sexual activity: Not on file  Lifestyle  . Physical activity:    Days per week: Not on file    Minutes per session: Not on file  . Stress: Not on file  Relationships  . Social connections:    Talks on phone: Not on file    Gets together: Not on file    Attends religious service: Not on file    Active member of club or organization: Not on file    Attends meetings of clubs or organizations: Not on file    Relationship status: Not on file  . Intimate partner violence:    Fear of current or ex partner: Not on file    Emotionally abused: Not on file    Physically abused: Not on file    Forced sexual activity: Not on file  Other Topics Concern  . Not on file  Social History Narrative   Right-handed   Caffeine: occasional cup of coffee in the morning     Family History  Problem Relation Age of Onset  . Hypertension Mother   . Diabetes Father   . Heart attack Father   . Heart attack Brother      Review of Systems: All other systems reviewed and are otherwise negative except as noted above.  Labs: Recent Labs    07/02/18 1929  TROPONINI <0.03   Lab Results  Component Value Date   WBC 9.9 07/02/2018   HGB 12.6 07/02/2018   HCT 39.8 07/02/2018   MCV 86.1 07/02/2018   PLT 243 07/02/2018    Recent Labs  Lab 07/02/18 1700  NA 134*  K 3.5  CL 102  CO2 19*  BUN 23  CREATININE 1.04*  CALCIUM 9.3  PROT 6.2*  BILITOT 0.5  ALKPHOS 94  ALT 20  AST 27  GLUCOSE 327*   Lab Results  Component Value Date   CHOL 287 (H) 05/19/2018   HDL 42  05/19/2018   LDLCALC 188 (H) 05/19/2018   TRIG 283 (H) 05/19/2018   Lab Results  Component Value Date   DDIMER 1.36 (H) 01/09/2018    Radiology/Studies:  Dg Chest 2 View  Result Date: 07/02/2018 CLINICAL DATA:  Hypertension and atrial fibrillation EXAM: CHEST - 2 VIEW COMPARISON:  January 09, 2018 FINDINGS: No edema or consolidation. The heart size and pulmonary vascularity are normal. No adenopathy. There is aortic atherosclerosis. There is degenerative change in each shoulder with suspected avascular necrosis in the superior right humeral head region. IMPRESSION: No edema or consolidation. Stable cardiac silhouette. There is aortic atherosclerosis. Suspect a degree of avascular necrosis in the superior right humeral head. Aortic Atherosclerosis (ICD10-I70.0). Electronically Signed   By: Lowella Grip III M.D.   On: 07/02/2018 17:44    Wt Readings from Last 3 Encounters:  07/02/18 80.9 kg  05/19/18 80.9 kg  04/22/18 79 kg    EKG: Atrial fibrillation, with Ashman's  phenomenon versus PVC.  Physical Exam: Blood pressure (!) 187/98, pulse 98, temperature (!) 97.4 F (36.3 C), temperature source Axillary, resp. rate 18, height '5\' 3"'  (1.6 m), weight 80.9 kg, SpO2 98 %. Body mass index is 31.59 kg/m. General: Well developed, well nourished, in no acute distress.  Neck: Negative for carotid bruits. JVD not elevated. Lungs: Clear bilaterally to auscultation without wheezes, rales, or rhonchi. Breathing is unlabored. Heart: RRR with S1 S2. No murmurs, rubs, or gallops appreciated. Abdomen: Soft, non-tender, non-distended with normoactive bowel sounds. No hepatomegaly. No rebound/guarding. No obvious abdominal masses. Msk:  Strength and tone appear normal for age. Extremities: No clubbing or cyanosis. No edema.  Distal pedal pulses are 2+ and equal bilaterally. Neuro: Right-sided facial droop.  Significantly slurred speech, significant word finding difficulty     Assessment and Plan   77 year old lady with recent ischemic stroke, who presents with atrial fibrillation/RVR and elevated blood pressures.  She has responded some to my IV metoprolol, with heart rates being moderately controlled in the 100s to 110s.  I do suspect that atrial fibrillation and a cardio-embolic source was responsible for her recent ischemic stroke.  As such, would recommend initiating Eliquis for prevention of recurrent stroke.  Plan rate control strategy overnight; recommend starting metoprolol tartrate 25 mg every 6 hours for now.   This can be uptitrated as needed.  Would also plan for IV fluid resuscitation, given recent history of nausea, vomiting and diarrhea. It would be reasonable to proceed with a TEE/cardioversion tomorrow to restore sinus rhythm.  She has already had a recent TTE, which was relatively unremarkable for structural heart disease, save for moderate MR.  No need to repeat at present.   Cardiology will follow along with you.  Signed, Doylene Canning, MD 07/02/2018, 11:40 PM

## 2018-07-02 NOTE — H&P (Addendum)
History and Physical    [name]:[id number] DOB: 23-Dec-1940 DOA: 07/02/2018  Referring MD/NP/PA:   PCP: Dorise Hiss, PA-C   Patient coming from:  The patient is coming from home.  At baseline, pt is independent for most of ADL.  Chief Complaint: Irregular heartbeat with tachycardia  HPI: [name] is a 77 y.o. female with medical history significant of hypertension, hyperlipidemia, diabetes mellitus, stroke with aphasia, CAD, stent placement, hypersomnia, narcotic addiction, obesity, CHF, anxiety, who presents with irregular heartbeat and tachycardia.  Per report, today a home health nurse came out to see her. She was found to have elevated blood pressure, and irregular heartbeat with tachycardia.  Heart rate 120-160s. Blood pressure 218/102.  Patient denies any chest pain, shortness breath, palpitation.  No cough, fever or chills.  She states that she has had N/V/D for the past several days. It resolved a couple days ago.  Currently patient does not have nausea, vomiting, diarrhea or abdominal pain pain, no symptoms of UTI.  Patient has aphasia from previous stroke, which has not changed.  No new unilateral weakness, numbness or tingling in extremities.  ED Course: pt was found to have atrial fibrillation with RVR on EKG, WBC 9.9, troponin negative, urinalysis not impressive, creatinine 1.04, temperature 97.4, tachypnea, oxygen saturation 88 initially-->then 95% on RA.  Chest x-ray is negative for infiltration, showed a possible avascular necrosis in the superior right humeral head. Pt is placed on tele bed for obs. EDP will consult card.  Review of Systems:   General: no fevers, chills, no body weight gain, fatigue HEENT: no blurry vision, hearing changes or sore throat Respiratory: no dyspnea, coughing, wheezing CV: no chest pain, no palpitations GI: no nausea, vomiting, abdominal pain, diarrhea, constipation GU: no dysuria, burning on urination, increased  urinary frequency, hematuria  Ext: no leg edema Neuro: no unilateral weakness, numbness, or tingling, no vision change or hearing loss. Has aphasia. Skin: no rash, no skin tear. MSK: No muscle spasm, no deformity, no limitation of range of movement in spin Heme: No easy bruising.  Travel history: No recent long distant travel.  Allergy:  Allergies  Allergen Reactions  . Azithromycin Nausea And Vomiting  . Cardizem [Diltiazem Hcl] Nausea And Vomiting  . Codeine Other (See Comments)    Feeling weird   . Coreg [Carvedilol] Nausea And Vomiting  . Demerol [Meperidine] Other (See Comments)    Per pt disoriented  . Erythromycin Nausea And Vomiting    Very sick  . Inderal [Propranolol] Other (See Comments)    Made me cry  . Procardia [Nifedipine] Other (See Comments)    Disoriented, sick  . Wellbutrin [Bupropion] Other (See Comments)    Unknown reaction  . Zoloft [Sertraline Hcl] Other (See Comments)    Could not talk, stiff    Past Medical History:  Diagnosis Date  . Abdominal discomfort    "due to medication intolerance"  . CAD (coronary artery disease)    previous stent  . Chronic pain syndrome 09/10/2013   Dr Nelva Bush,   . [place] Patient virus infection 1988  . Excessive daytime sleepiness 12/05/2014   Patient has not been seen for a sleep study as ordered, and used adderall to keep alert in daytime.  She agreed  In a contract not to have any scheduled medication for pain treatment from [facility] and will not receive refills for Adderall, which was initiated by dr Orland Penman, PCP>   . Fall   . Hyperlipemia   .  Hypersomnia, persistent 09/10/2013   Patient on  Stimulants .  Marland Kitchen Hypertension   . Narcotic addiction ([place]) 12/05/2014  . Obesity, unspecified 09/10/2013  . Shoulder joint pain    both shoulders    Past Surgical History:  Procedure Laterality Date  . ANGIOPLASTY  03/01/2002   cutting balloon mid RCA  . BACK SURGERY    . CARDIAC CATHETERIZATION  01/26/2007   mild diffuse CAD  .  CARDIAC CATHETERIZATION  10/23/2009   nonobstructive CAD,60% prox RCA,50% prox LAD, 50% mid ramus  . CORONARY STENT PLACEMENT  03/15/2005   distal RCA  . NEPHRECTOMY      Social History:  reports that she has never smoked. She has never used smokeless tobacco. She reports that she does not drink alcohol or use drugs.  Family History:  Family History  Problem Relation Age of Onset  . Hypertension Mother   . Diabetes Father   . Heart attack Father   . Heart attack Brother      Prior to Admission medications   Medication Sig Start Date End Date Taking? Authorizing Provider  ACCU-CHEK SOFTCLIX LANCETS lancets USE TO TEST FOUR TIMES DAILY 04/29/18   McVey, Gelene Mink, PA-C  Alfalfa 250 MG TABS Take 1 tablet by mouth every morning.    [provider]  amphetamine-dextroamphetamine (ADDERALL) 20 MG tablet Take 1 tablet (20 mg total) by mouth 2 (two) times daily as needed. Patient taking differently: Take 20 mg by mouth 2 (two) times daily as needed (chronic fatigue syndrome).  02/27/18 03/29/18  McVey, Gelene Mink, PA-C  atenolol (TENORMIN) 25 MG tablet TAKE 1 TABLET BY MOUTH IN THE MORNING AND 2 IN THE EVENING 04/07/18   McVey, Gelene Mink, PA-C  atorvastatin (LIPITOR) 40 MG tablet Take 1 tablet (40 mg total) by mouth daily at 6 PM. 06/24/18   McVey, Gelene Mink, PA-C  blood glucose meter kit and supplies Dispense based on patient and insurance preference. Use up to four times daily as directed. (FOR ICD-9 250.00, 250.01). 09/27/17   Oswald Hillock, MD  Cholecalciferol (VITAMIN D3) 5000 units TBDP Take 1 capsule by mouth daily.    [provider]  CHROMIUM ASPARTATE PO Take 1 tablet by mouth every morning.    [provider]  Cinnamon Bark POWD Take 1 Dose by mouth daily.    [provider]  clopidogrel (PLAVIX) 75 MG tablet Take 1 tablet (75 mg total) by mouth daily. 05/21/18   Domenic Polite, MD  DHA-EPA-Coenzyme Q10-Vitamin E (GNP COQ-10 &  FISH OIL) 120-180-50-30 CAPS Take 3 tablets by mouth daily.     [provider]  diazepam (VALIUM) 5 MG tablet TAKE 1 TABLET BY MOUTH EVERY 12 HOURS AS NEEDED FOR ANXIETY 04/07/18   McVey, Gelene Mink, PA-C  erythromycin ophthalmic ointment Place 1 application into the right eye 2 (two) times daily. 05/06/18   [provider]  fexofenadine (ALLEGRA) 180 MG tablet Take 180 mg by mouth daily as needed for allergies.     [provider]  fluorometholone (FML) 0.1 % ophthalmic suspension Place 1 drop into the right eye 4 (four) times daily. 05/05/18   [provider]  glucose 4 GM chewable tablet Chew 1 tablet by mouth as needed for low blood sugar.    [provider]  glucose blood (ACCU-CHEK AVIVA PLUS) test strip USE TO TEST FOUR TIMES DAILY 12/02/17   McVey, Gelene Mink, PA-C  hydrochlorothiazide (HYDRODIURIL) 25 MG tablet Take 25 mg by mouth  daily.    [provider]  HYDROcodone-acetaminophen (NORCO) 10-325 MG per tablet Take 1-2 tablets by mouth every 4 (four) hours as needed for moderate pain.  09/28/14   [provider]  Insulin Glargine (LANTUS SOLOSTAR) 100 UNIT/ML Solostar Pen Inject 24 Units into the skin daily at 10 pm. 10/24/17   McVey, Gelene Mink, PA-C  Insulin Syringe-Needle U-100 (INSULIN SYRINGE .5CC/31GX5/16") 31G X 5/16" 0.5 ML MISC Use as directed 09/27/17   Oswald Hillock, MD  levothyroxine (SYNTHROID, LEVOTHROID) 50 MCG tablet Take 1 tablet (50 mcg total) by mouth daily before breakfast. 03/07/17   McVey, Gelene Mink, PA-C  lisinopril (PRINIVIL,ZESTRIL) 20 MG tablet TAKE 1 TABLET BY MOUTH IN THE MORNING AND 1/2 (ONE-HALF) AT BEDTIME PLEASE SCHEDULE AN APPT FOR FUTURE REFILLS 05/21/18   Croitoru, Mihai, MD  magnesium oxide (MAG-OX) 400 MG tablet Take 400 mg by mouth 2 (two) times daily.    [provider]  Misc. Devices (HUGO ROLLING WALKER ELITE) MISC 1 Units by Does not apply route daily as  needed. 11/29/16   McVey, Gelene Mink, PA-C  potassium chloride (K-DUR) 10 MEQ tablet Take 10 mEq by mouth daily. 05/06/18   [provider]  venlafaxine (EFFEXOR) 100 MG tablet TAKE 1 TABLET BY MOUTH ONCE DAILY 05/06/18   Dorise Hiss, [place]    Physical Exam: Vitals:   07/03/18 0400 07/03/18 0518 07/03/18 0556 07/03/18 0559  BP: (!) 162/75 134/73  (!) 153/87  Pulse: (!) 46 77 (!) 103   Resp: (!) _0 Temp:   97.9 F (36.6 C)   TempSrc:   Oral   SpO2: 98% 96% 98%   Weight:   77.4 kg   Height:   5' 3" (1.6 m)    General: Not in acute distress HEENT:       Eyes: PERRL, EOMI, no scleral icterus.       ENT: No discharge from the ears and nose, no pharynx injection, no tonsillar enlargement.        Neck: No JVD, no bruit, no mass felt. Heme: No neck lymph node enlargement. Cardiac: S1/S2, irregularly irregular rhythm, No murmurs, No gallops or rubs. Respiratory: o rales, wheezing, rhonchi or rubs. GI: Soft, nondistended, nontender, no rebound pain, no organomegaly, BS present. GU: No hematuria Ext: No pitting leg edema bilaterally. 2+DP/PT pulse bilaterally. Musculoskeletal: No joint deformities, No joint redness or warmth, no limitation of ROM in spin. Skin: No rashes.  Neuro: Alert, oriented X3, cranial nerves II-XII grossly intact, moves all extremities normally. Has aphasia Psych: Patient is not psychotic, no suicidal or hemocidal ideation.  Labs on Admission: I have personally reviewed following labs and imaging studies  CBC: Recent Labs  Lab 07/02/18 1654 07/03/18 0451  WBC 9.9 9.8  HGB 12.6 12.8  HCT 39.8 40.0  MCV 86.1 85.8  PLT 243 563   Basic Metabolic Panel: Recent Labs  Lab 07/02/18 1700 07/03/18 0451  NA 134* 140  K 3.5 3.6  CL 102 106  CO2 19* 20*  GLUCOSE 327* 172*  BUN 23 16  CREATININE 1.04* 0.76  CALCIUM 9.3 9.6   GFR: Estimated Creatinine Clearance: 57.1 mL/min (by C-G formula based on SCr of 0.76 mg/dL). Liver  Function Tests: Recent Labs  Lab 07/02/18 1700  AST 27  ALT 20  ALKPHOS 94  BILITOT 0.5  PROT 6.2*  ALBUMIN 3.5   No results for input(s): LIPASE, AMYLASE in the last 168 hours. No results for input(s):  AMMONIA in the last 168 hours. Coagulation Profile: No results for input(s): INR, PROTIME in the last 168 hours. Cardiac Enzymes: Recent Labs  Lab 07/02/18 1929 07/02/18 2346 07/03/18 0451  TROPONINI <0.03 <0.03 <0.03   BNP (last 3 results) No results for input(s): PROBNP in the last 8760 hours. HbA1C: No results for input(s): HGBA1C in the last 72 hours. CBG: No results for input(s): GLUCAP in the last 168 hours. Lipid Profile: No results for input(s): CHOL, HDL, LDLCALC, TRIG, CHOLHDL, LDLDIRECT in the last 72 hours. Thyroid Function Tests: Recent Labs    07/02/18 2346 07/03/18 0452  TSH  --  1.309  FREET4 0.83  --    Anemia Panel: No results for input(s): VITAMINB12, FOLATE, FERRITIN, TIBC, IRON, RETICCTPCT in the last 72 hours. Urine analysis:    Component Value Date/Time   COLORURINE YELLOW 07/02/2018 1852   APPEARANCEUR CLEAR 07/02/2018 1852   LABSPEC 1.017 07/02/2018 1852   PHURINE 5.0 07/02/2018 1852   GLUCOSEU 150 (A) 07/02/2018 1852   HGBUR SMALL (A) 07/02/2018 1852   BILIRUBINUR NEGATIVE 07/02/2018 1852   KETONESUR NEGATIVE 07/02/2018 1852   PROTEINUR NEGATIVE 07/02/2018 1852   UROBILINOGEN 0.2 07/15/2013 1819   NITRITE NEGATIVE 07/02/2018 1852   LEUKOCYTESUR MODERATE (A) 07/02/2018 1852   Sepsis Labs: _0 (procalcitonin:4,lacticidven:4) )No results found for this or any previous visit (from the past 240 hour(s)).   Radiological Exams on Admission: Dg Chest 2 View  Result Date: 07/02/2018 CLINICAL DATA:  Hypertension and atrial fibrillation EXAM: CHEST - 2 VIEW COMPARISON:  January 09, 2018 FINDINGS: No edema or consolidation. The heart size and pulmonary vascularity are normal. No adenopathy. There is aortic atherosclerosis. There is  degenerative change in each shoulder with suspected avascular necrosis in the superior right humeral head region. IMPRESSION: No edema or consolidation. Stable cardiac silhouette. There is aortic atherosclerosis. Suspect a degree of avascular necrosis in the superior right humeral head. Aortic Atherosclerosis (ICD10-I70.0). Electronically Signed   By: Lowella Grip III M.D.   On: 07/02/2018 17:44     EKG: Independently reviewed.  Not done in ED, will get one.   Assessment/Plan Principal Problem:   Atrial fibrillation with RVR (HCC) Active Problems:   CAD (coronary artery disease) status post stent to distal right coronary artery 2006   Essential hypertension   Hyperlipidemia   Anxiety   Hypersomnia, persistent   Type 2 diabetes mellitus without complication, with long-term current use of insulin (HCC)   Aphasia   Stroke (cerebrum) (HCC)   Hypothyroidism   Chronic diastolic CHF (congestive heart failure) (HCC)   Atrial fibrillation with RVR ([place]): CHADS-VASc 7. Cardiology, Dr. Bradley Lions was consulted. He recommended to start Eliquis, metoprolol. "It would be reasonable to proceed with a TEE/cardioversion tomorrow to restore sinus rhythm".   -will place to SDU for obs -start Eliquis -Metoprolol 25 mg every 6 hours (hold home atenolol) -PRN IV metoprolol for rate control -Troponin x3 -TSH, free T4, free T3 -possible TEE per card  CAD (coronary artery disease) status post stent to distal right coronary artery 2006: no CP -f/u trop x 3 due to A fib -on plavix and lipitor  Essential hypertension: -HCTZ, metoprolol, lisinopril -IV hydralazine as needed  Hyperlipidemia: -Lipitor  Anxiety: -Continue home Valium  Hypersomnia, persistent: -Adderall  Type 2 diabetes mellitus without complication, with long-term current use of insulin ([place]): Last A1c 9.3 on 05/19/2018, poorly controled. Patient is taking Lantus at home -will decrease Lantus dose from 24 to 18 units  daily -SSI  Stroke with aphasia: -Plavix, Lipitor  Hypothyroidism: Last TSH was 0.75 02/14/2018 -Continue home Synthroid  Chronic diastolic CHF (congestive heart failure) ([place]): 2D echo on 05/19/2018 showed EF CT deficit 5% with grade 2 diastolic dysfunction.  Patient does not have leg edema or JVD.  CHF is compensated. -Patient is on HCTZ. -Check BMP  Possible avascular necrosis in the superior right humeral head: CXR finding is concerning for avascular necrosis in the superior right humeral head.  -f/u with PCP    DVT ppx: on Eliqis Code Status: Full code Family Communication: None at bed side.     Disposition Plan:  Anticipate discharge back to previous home environment Consults called:  Dr. Bowdon Lions for cardiology Admission status:   SDU/obs     Date of Service 07/03/2018    Ivor Costa [facility] Pager [phone]  If 7PM-7AM, please contact night-coverage [email or web address] Password [email or web address] 07/03/2018, 7:03 AM

## 2018-07-02 NOTE — ED Notes (Signed)
URINE CULTURE "SAVE TUBE" SENT TO LAB

## 2018-07-02 NOTE — Telephone Encounter (Signed)
Received a phone call from [place] speech therapist from [facility]. Erie NoeVanessa stated that the pt's BP was taken (automatic BP monitor): 152/118 HR 134 and 174/112 HR 102. Manual BP was taken and her BP was 184/80 HR 120 and "very irregular." Per Erie NoeVanessa, pt has not taken her BP medicines today.  Erie NoeVanessa stated the patient denied headache,chest pain, blurred vision, difficulty breathing. Pt has weakness due to h/o of CVA.  Per Erie NoeVanessa, pt was sick with vomiting earlier this week and pt refused visits until today. Due to her hypertension and heart rate, advised patients husband to take pt to the ED now. Pt's husband agreed to this and stated he will call EMS [facility]. Called PCP office and informed Lowella Bandyikki the above information and the ED disposition. Lowella Bandyikki stated she will inform pt's PCP CitigroupWhitney McVey PA.  Reason for Disposition . Systolic BP  >= 180 OR Diastolic >= 110    BP with automatic BP monitor: 152/118 HR 134 abd 174/112 HR 102 . [1] Skipped or extra beat(s) AND [2] occurs 4 or more times per minute    Home health nurse noted HR to be very irregular with a rate of 120. Marland Kitchen. History of heart disease  (i.e., heart attack, bypass surgery, angina, angioplasty, CHF) (Exception: brief heart beat symptoms that went away and now feels well)    H/o coronary artery disease  Additional Information . Negative: [1] Heart beating very rapidly (e.g., > 140 / minute) AND [2] present now  (Exception: during exercise)    HR 102-134 . Commented on: Ran out of BP medications    BP medicine at pharmacy that has not been picked up.  Answer Assessment - Initial Assessment Questions 1. BLOOD PRESSURE: "What is the blood pressure?" "Did you take at least two measurements 5 minutes apart?"     152/118 HR 134  Repeat BP:174/112 HR 102  Manual BP: 184/90 HR iregurlay 2. ONSET: "When did you take your blood pressure?"    2:17 pm 3. HOW: "How did you obtain the blood pressure?" (e.g., visiting nurse,  automatic home BP monitor)     Automatic BP monitor and manual 4. HISTORY: "Do you have a history of high blood pressure?" yes 5. MEDICATIONS: "Are you taking any medications for blood pressure?" "Have you missed any doses recently?"     Yes- yes 6. OTHER SYMPTOMS: "Do you have any symptoms?" (e.g., headache, chest pain, blurred vision, difficulty breathing, weakness)     Weakness  7. PREGNANCY: "Is there any chance you are pregnant?" "When was your last menstrual period?"     n/a  Answer Assessment - Initial Assessment Questions 1. DESCRIPTION: "Please describe your heart rate or heart beat that you are having" (e.g., fast/slow, regular/irregular, skipped or extra beats, "palpitations")     Fast, irregular 2. ONSET: "When did it start?" (Minutes, hours or days)      Pt was sick earlier in the week with vomiting and she refused home ehalth visits- Unknown as to when rapid heart started.  3. DURATION: "How long does it last" (e.g., seconds, minutes, hours)     Was present at time of call. Pt home health aide took a manual BP and noted that the heart rate was 120 and "very irregular"       5. TAP: "Using your hand, can you tap out what you are feeling on a chair or table in front of you, so that I can hear?" (Note: not all patients  can do this)       Pt unable to do this 6. HEART RATE: "Can you tell me your heart rate?" "How many beats in 15 seconds?"  (Note: not all patients can do this)       Automatic BP monitor: 134 and 102 and with Manual cuff HR was 120.  9. CARDIAC HISTORY: "Do you have any history of heart disease?" (e.g., heart attack, angina, bypass surgery, angioplasty, arrhythmia)  Pt with h/o stroke and right Bells palsy 10. OTHER SYMPTOMS: "Do you have any other symptoms?" (e.g., dizziness, chest pain, sweating, difficulty breathing)       Pt denies any symptoms  Protocols used: HIGH BLOOD PRESSURE-A-AH, HEART RATE AND HEARTBEAT QUESTIONS-A-AH

## 2018-07-02 NOTE — ED Notes (Signed)
Patient transported to X-ray 

## 2018-07-03 ENCOUNTER — Observation Stay (HOSPITAL_COMMUNITY): Payer: Medicare Other | Admitting: Certified Registered"

## 2018-07-03 ENCOUNTER — Encounter (HOSPITAL_COMMUNITY): Payer: Self-pay | Admitting: Cardiovascular Disease

## 2018-07-03 ENCOUNTER — Encounter (HOSPITAL_COMMUNITY)
Admission: EM | Disposition: A | Discharge status: Home Health | Payer: Self-pay | Source: Home / Self Care | Attending: Internal Medicine

## 2018-07-03 ENCOUNTER — Observation Stay (HOSPITAL_COMMUNITY): Payer: Medicare Other

## 2018-07-03 DIAGNOSIS — I160 Hypertensive urgency: Secondary | ICD-10-CM

## 2018-07-03 DIAGNOSIS — I69328 Other speech and language deficits following cerebral infarction: Secondary | ICD-10-CM | POA: Diagnosis not present

## 2018-07-03 DIAGNOSIS — I4891 Unspecified atrial fibrillation: Secondary | ICD-10-CM | POA: Diagnosis not present

## 2018-07-03 DIAGNOSIS — E039 Hypothyroidism, unspecified: Secondary | ICD-10-CM | POA: Diagnosis not present

## 2018-07-03 DIAGNOSIS — G4710 Hypersomnia, unspecified: Secondary | ICD-10-CM | POA: Diagnosis not present

## 2018-07-03 DIAGNOSIS — F419 Anxiety disorder, unspecified: Secondary | ICD-10-CM | POA: Diagnosis not present

## 2018-07-03 DIAGNOSIS — I340 Nonrheumatic mitral (valve) insufficiency: Secondary | ICD-10-CM

## 2018-07-03 DIAGNOSIS — N179 Acute kidney failure, unspecified: Secondary | ICD-10-CM | POA: Diagnosis not present

## 2018-07-03 DIAGNOSIS — E785 Hyperlipidemia, unspecified: Secondary | ICD-10-CM | POA: Diagnosis not present

## 2018-07-03 DIAGNOSIS — I110 Hypertensive heart disease with heart failure: Secondary | ICD-10-CM | POA: Diagnosis not present

## 2018-07-03 DIAGNOSIS — I69392 Facial weakness following cerebral infarction: Secondary | ICD-10-CM | POA: Diagnosis not present

## 2018-07-03 DIAGNOSIS — I513 Intracardiac thrombosis, not elsewhere classified: Secondary | ICD-10-CM | POA: Diagnosis not present

## 2018-07-03 DIAGNOSIS — I351 Nonrheumatic aortic (valve) insufficiency: Secondary | ICD-10-CM

## 2018-07-03 DIAGNOSIS — I639 Cerebral infarction, unspecified: Secondary | ICD-10-CM | POA: Diagnosis not present

## 2018-07-03 DIAGNOSIS — I083 Combined rheumatic disorders of mitral, aortic and tricuspid valves: Secondary | ICD-10-CM | POA: Diagnosis not present

## 2018-07-03 DIAGNOSIS — I5032 Chronic diastolic (congestive) heart failure: Secondary | ICD-10-CM

## 2018-07-03 DIAGNOSIS — I2510 Atherosclerotic heart disease of native coronary artery without angina pectoris: Secondary | ICD-10-CM | POA: Diagnosis not present

## 2018-07-03 DIAGNOSIS — M879 Osteonecrosis, unspecified: Secondary | ICD-10-CM | POA: Diagnosis not present

## 2018-07-03 DIAGNOSIS — I10 Essential (primary) hypertension: Secondary | ICD-10-CM | POA: Diagnosis not present

## 2018-07-03 HISTORY — PX: TEE WITHOUT CARDIOVERSION: SHX5443

## 2018-07-03 LAB — GLUCOSE, CAPILLARY
GLUCOSE-CAPILLARY: 139 mg/dL — AB (ref 70–99)
GLUCOSE-CAPILLARY: 188 mg/dL — AB (ref 70–99)
Glucose-Capillary: 177 mg/dL — ABNORMAL HIGH (ref 70–99)
Glucose-Capillary: 148 mg/dL — ABNORMAL HIGH (ref 70–99)

## 2018-07-03 LAB — TROPONIN I
Troponin I: <0.03 ng/mL (ref <0.03)
Troponin I: <0.03 ng/mL (ref <0.03)
Troponin I: <0.03 ng/mL (ref <0.03)

## 2018-07-03 LAB — CBC
HEMATOCRIT: 40 % (ref 36.0–46.0)
HEMOGLOBIN: 12.8 g/dL (ref 12.0–15.0)
MCH: 27.5 pg (ref 26.0–34.0)
MCHC: 32 g/dL (ref 30.0–36.0)
MCV: 85.8 fL (ref 78.0–100.0)
Platelets: 243 10*3/uL (ref 150–400)
RBC: 4.66 MIL/uL (ref 3.87–5.11)
RDW: 14 % (ref 11.5–15.5)
WBC: 9.8 10*3/uL (ref 4.0–10.5)

## 2018-07-03 LAB — BASIC METABOLIC PANEL
ANION GAP: 14 (ref 5–15)
BUN: 16 mg/dL (ref 8–23)
CHLORIDE: 106 mmol/L (ref 98–111)
CO2: 20 mmol/L — ABNORMAL LOW (ref 22–32)
Calcium: 9.6 mg/dL (ref 8.9–10.3)
Creatinine, Ser: 0.76 mg/dL (ref 0.44–1.00)
GFR calc non Af Amer: >60 mL/min (ref >60)
Glucose, Bld: 172 mg/dL — ABNORMAL HIGH (ref 70–99)
POTASSIUM: 3.6 mmol/L (ref 3.5–5.1)
Sodium: 140 mmol/L (ref 135–145)

## 2018-07-03 LAB — MRSA PCR SCREENING: MRSA by PCR: NEGATIVE

## 2018-07-03 LAB — T4, FREE: FREE T4: 0.83 ng/dL (ref 0.82–1.77)

## 2018-07-03 LAB — TSH: TSH: 1.309 u[IU]/mL (ref 0.350–4.500)

## 2018-07-03 LAB — BRAIN NATRIURETIC PEPTIDE: B Natriuretic Peptide: 70.7 pg/mL (ref 0.0–100.0)

## 2018-07-03 SURGERY — ECHOCARDIOGRAM, TRANSESOPHAGEAL
Anesthesia: Monitor Anesthesia Care

## 2018-07-03 MED ORDER — AMPHETAMINE-DEXTROAMPHETAMINE 20 MG PO TABS: 20.0000 mg | ORAL_TABLET | Freq: Two times a day (BID) | ORAL | Status: DC | PRN

## 2018-07-03 MED ORDER — SODIUM CHLORIDE 0.9% FLUSH: 3.0000 mL | INTRAVENOUS | Status: DC | PRN

## 2018-07-03 MED ORDER — DIAZEPAM 5 MG PO TABS
5.0000 mg | ORAL_TABLET | Freq: Two times a day (BID) | ORAL | Status: DC | PRN
  Administered 2018-07-03: 5 mg via ORAL
  Filled 2018-07-03: qty 1

## 2018-07-03 MED ORDER — CINNAMON BARK POWD: 1.0000 | Freq: Every day | Status: DC

## 2018-07-03 MED ORDER — APIXABAN 5 MG PO TABS
5.0000 mg | ORAL_TABLET | Freq: Two times a day (BID) | ORAL | Status: DC
  Administered 2018-07-03 – 2018-07-05 (×5): 5 mg via ORAL
  Filled 2018-07-03 (×7): qty 1

## 2018-07-03 MED ORDER — POTASSIUM CHLORIDE CRYS ER 10 MEQ PO TBCR
10.0000 meq | EXTENDED_RELEASE_TABLET | Freq: Every day | ORAL | Status: DC
  Administered 2018-07-03 – 2018-07-05 (×3): 10 meq via ORAL
  Filled 2018-07-03 (×5): qty 1

## 2018-07-03 MED ORDER — HYDROCODONE-ACETAMINOPHEN 10-325 MG PO TABS
1.0000 | ORAL_TABLET | ORAL | Status: DC | PRN
  Administered 2018-07-03: 2 via ORAL
  Administered 2018-07-04 – 2018-07-05 (×4): 1 via ORAL
  Filled 2018-07-03 (×4): qty 1
  Filled 2018-07-03: qty 2

## 2018-07-03 MED ORDER — LISINOPRIL 10 MG PO TABS: 10.0000 mg | ORAL_TABLET | Freq: Two times a day (BID) | ORAL | Status: DC

## 2018-07-03 MED ORDER — HYDROCHLOROTHIAZIDE 25 MG PO TABS
25.0000 mg | ORAL_TABLET | Freq: Every day | ORAL | Status: DC
  Administered 2018-07-03 – 2018-07-05 (×3): 25 mg via ORAL
  Filled 2018-07-03 (×3): qty 1

## 2018-07-03 MED ORDER — PROPOFOL 500 MG/50ML IV EMUL
INTRAVENOUS | Status: DC | PRN
  Administered 2018-07-03: 200 ug/kg/min via INTRAVENOUS

## 2018-07-03 MED ORDER — ENOXAPARIN SODIUM 40 MG/0.4ML ~~LOC~~ SOLN
80.0000 mg | Freq: Once | SUBCUTANEOUS | Status: AC
  Administered 2018-07-03: 80 mg via SUBCUTANEOUS
  Filled 2018-07-03 (×2): qty 0.8

## 2018-07-03 MED ORDER — SODIUM CHLORIDE 0.9 % IV SOLN
INTRAVENOUS | Status: DC
  Administered 2018-07-03: 14:00:00 via INTRAVENOUS

## 2018-07-03 MED ORDER — INSULIN GLARGINE 100 UNIT/ML ~~LOC~~ SOLN
18.0000 [IU] | Freq: Every day | SUBCUTANEOUS | Status: DC
  Administered 2018-07-03 – 2018-07-04 (×2): 18 [IU] via SUBCUTANEOUS
  Filled 2018-07-03 (×3): qty 0.18

## 2018-07-03 MED ORDER — LORATADINE 10 MG PO TABS
10.0000 mg | ORAL_TABLET | Freq: Every day | ORAL | Status: DC
  Administered 2018-07-03 – 2018-07-05 (×3): 10 mg via ORAL
  Filled 2018-07-03 (×3): qty 1

## 2018-07-03 MED ORDER — ERYTHROMYCIN 5 MG/GM OP OINT: 1.0000 "application " | TOPICAL_OINTMENT | Freq: Two times a day (BID) | OPHTHALMIC | Status: DC

## 2018-07-03 MED ORDER — SODIUM CHLORIDE 0.9% FLUSH
3.0000 mL | Freq: Two times a day (BID) | INTRAVENOUS | Status: DC
  Administered 2018-07-03 – 2018-07-04 (×2): 3 mL via INTRAVENOUS

## 2018-07-03 MED ORDER — VENLAFAXINE HCL 50 MG PO TABS
100.0000 mg | ORAL_TABLET | Freq: Every day | ORAL | Status: DC
  Administered 2018-07-03 – 2018-07-05 (×3): 100 mg via ORAL
  Filled 2018-07-03 (×3): qty 2

## 2018-07-03 MED ORDER — ATORVASTATIN CALCIUM 40 MG PO TABS
40.0000 mg | ORAL_TABLET | Freq: Every day | ORAL | Status: DC
  Administered 2018-07-03 – 2018-07-04 (×2): 40 mg via ORAL
  Filled 2018-07-03 (×2): qty 1

## 2018-07-03 MED ORDER — DHA-EPA-COENZYME Q10-VITAMIN E 120-180-50-30 PO CAPS: 3.0000 | ORAL_CAPSULE | Freq: Every day | ORAL | Status: DC

## 2018-07-03 MED ORDER — METOPROLOL TARTRATE 25 MG PO TABS
25.0000 mg | ORAL_TABLET | Freq: Four times a day (QID) | ORAL | Status: DC
  Administered 2018-07-03 – 2018-07-04 (×6): 25 mg via ORAL
  Filled 2018-07-03 (×6): qty 1

## 2018-07-03 MED ORDER — LISINOPRIL 10 MG PO TABS
10.0000 mg | ORAL_TABLET | Freq: Every evening | ORAL | Status: DC
  Administered 2018-07-03 – 2018-07-04 (×2): 10 mg via ORAL
  Filled 2018-07-03 (×3): qty 1

## 2018-07-03 MED ORDER — PROPOFOL 10 MG/ML IV BOLUS
INTRAVENOUS | Status: DC | PRN
  Administered 2018-07-03 (×2): 20 mg via INTRAVENOUS
  Administered 2018-07-03: 30 mg via INTRAVENOUS

## 2018-07-03 MED ORDER — CLOPIDOGREL BISULFATE 75 MG PO TABS
75.0000 mg | ORAL_TABLET | Freq: Every day | ORAL | Status: DC
  Administered 2018-07-03 – 2018-07-04 (×2): 75 mg via ORAL
  Filled 2018-07-03 (×2): qty 1

## 2018-07-03 MED ORDER — CHROMIUM ASPARTATE 1000 MCG PO TABS: ORAL_TABLET | Freq: Every morning | ORAL | Status: DC

## 2018-07-03 MED ORDER — MAGNESIUM OXIDE 400 (241.3 MG) MG PO TABS
400.0000 mg | ORAL_TABLET | Freq: Two times a day (BID) | ORAL | Status: DC
  Administered 2018-07-03 – 2018-07-05 (×5): 400 mg via ORAL
  Filled 2018-07-03 (×8): qty 1

## 2018-07-03 MED ORDER — LISINOPRIL 20 MG PO TABS
20.0000 mg | ORAL_TABLET | ORAL | Status: DC
  Administered 2018-07-03 – 2018-07-05 (×4): 20 mg via ORAL
  Filled 2018-07-03 (×3): qty 1

## 2018-07-03 MED ORDER — FLUOROMETHOLONE 0.1 % OP SUSP: 1.0000 [drp] | Freq: Four times a day (QID) | OPHTHALMIC | Status: DC

## 2018-07-03 MED ORDER — ALFALFA 250 MG PO TABS: 1.0000 | ORAL_TABLET | Freq: Every morning | ORAL | Status: DC

## 2018-07-03 MED ORDER — VITAMIN D 1000 UNITS PO TABS
5000.0000 [IU] | ORAL_TABLET | Freq: Every day | ORAL | Status: DC
  Administered 2018-07-03 – 2018-07-05 (×3): 5000 [IU] via ORAL
  Filled 2018-07-03 (×3): qty 5

## 2018-07-03 MED ORDER — LEVOTHYROXINE SODIUM 50 MCG PO TABS
50.0000 ug | ORAL_TABLET | Freq: Every day | ORAL | Status: DC
  Administered 2018-07-03 – 2018-07-05 (×3): 50 ug via ORAL
  Filled 2018-07-03 (×3): qty 1

## 2018-07-03 NOTE — Anesthesia Preprocedure Evaluation (Signed)
Anesthesia Evaluation  Patient identified by MRN, date of birth, ID band Patient awake    Reviewed: Allergy & Precautions, H&P , NPO status , Patient's Chart, lab work & pertinent test results  Airway Mallampati: II  TM Distance: >3 FB Neck ROM: Full    Dental no notable dental hx. (+) Teeth Intact, Dental Advisory Given   Pulmonary neg pulmonary ROS,    Pulmonary exam normal breath sounds clear to auscultation       Cardiovascular hypertension, Pt. on medications and Pt. on home beta blockers + CAD, + Cardiac Stents and +CHF  + dysrhythmias Atrial Fibrillation  Rhythm:Irregular Rate:Normal     Neuro/Psych Anxiety Depression CVA, Residual Symptoms    GI/Hepatic negative GI ROS, Neg liver ROS,   Endo/Other  diabetesHypothyroidism   Renal/GU negative Renal ROS  negative genitourinary   Musculoskeletal   Abdominal   Peds  Hematology negative hematology ROS (+)   Anesthesia Other Findings   Reproductive/Obstetrics negative OB ROS                             Anesthesia Physical Anesthesia Plan  ASA: III  Anesthesia Plan: General   Post-op Pain Management:    Induction: Intravenous  PONV Risk Score and Plan: 3 and Propofol infusion and Treatment may vary due to age or medical condition  Airway Management Planned: Nasal Cannula  Additional Equipment:   Intra-op Plan:   Post-operative Plan:   Informed Consent: I have reviewed the patients History and Physical, chart, labs and discussed the procedure including the risks, benefits and alternatives for the proposed anesthesia with the patient or authorized representative who has indicated his/her understanding and acceptance.   Dental advisory given  Plan Discussed with: CRNA  Anesthesia Plan Comments:         Anesthesia Quick Evaluation

## 2018-07-03 NOTE — Progress Notes (Signed)
  Echocardiogram Echocardiogram Transesophageal has been performed.  Lenice Koper G Edrees Valent 07/03/2018, 3:19 PM

## 2018-07-03 NOTE — Transfer of Care (Signed)
Immediate Anesthesia Transfer of Care Note  Patient: [name]  Procedure(s) Performed: TRANSESOPHAGEAL ECHOCARDIOGRAM (TEE) (N/A )  Patient Location: Endoscopy Unit  Anesthesia Type:MAC  Level of Consciousness: drowsy  Airway & Oxygen Therapy: Patient Spontanous Breathing  Post-op Assessment: Report given to RN and Post -op Vital signs reviewed and stable  Post vital signs: Reviewed and stable  Last Vitals:  Vitals Value Taken Time  BP    Temp    Pulse 111 07/03/2018  6:16 PM  Resp 17 07/03/2018  6:16 PM  SpO2 94 % 07/03/2018  6:16 PM  Vitals shown include unvalidated device data.  Last Pain:  Vitals:   07/03/18 1546  TempSrc: Oral  PainSc:       Patients Stated Pain Goal: 0 (00/86/76 1950)  Complications: No apparent anesthesia complications

## 2018-07-03 NOTE — Progress Notes (Signed)
ANTICOAGULATION CONSULT NOTE - Initial Consult  Pharmacy Consult for apixaban Indication: atrial fibrillation  Allergies  Allergen Reactions  . Azithromycin Nausea And Vomiting  . Cardizem [Diltiazem Hcl] Nausea And Vomiting  . Codeine Other (See Comments)    Feeling weird   . Coreg [Carvedilol] Nausea And Vomiting  . Demerol [Meperidine] Other (See Comments)    Per pt disoriented  . Erythromycin Nausea And Vomiting    Very sick  . Inderal [Propranolol] Other (See Comments)    Made me cry  . Procardia [Nifedipine] Other (See Comments)    Disoriented, sick  . Wellbutrin [Bupropion] Other (See Comments)    Unknown reaction  . Zoloft [Sertraline Hcl] Other (See Comments)    Could not talk, stiff    Patient Measurements: Height: 5\' 3"  (160 cm) Weight: 178 lb 5.6 oz (80.9 kg)(from June 2019 records) IBW/kg (Calculated) : 52.4   Vital Signs: Temp: 97.4 F (36.3 C) (08/08 1648) Temp Source: Axillary (08/08 1648) BP: 133/69 (08/09 0131) Pulse Rate: 49 (08/09 0131)  Labs: Recent Labs    07/02/18 1654 07/02/18 1700 07/02/18 1929 07/02/18 2346  HGB 12.6  --   --   --   HCT 39.8  --   --   --   PLT 243  --   --   --   CREATININE  --  1.04*  --   --   TROPONINI  --   --  <0.03 <0.03    Estimated Creatinine Clearance: 44.9 mL/min (A) (by C-G formula based on SCr of 1.04 mg/dL (H)).   Medical History: Past Medical History:  Diagnosis Date  . Abdominal discomfort    "due to medication intolerance"  . CAD (coronary artery disease)    previous stent  . Chronic pain syndrome 09/10/2013   Dr Ethelene Halamos,   . Malachi CarlEpstein Barr virus infection 1988  . Excessive daytime sleepiness 12/05/2014   Patient has not been seen for a sleep study as ordered, and used adderall to keep alert in daytime.  She agreed  In a contract not to have any scheduled medication for pain treatment from [facility] and will not receive refills for Adderall, which was initiated by dr Ihor DowNnodi, PCP>   . Fall   .  Hyperlipemia   . Hypersomnia, persistent 09/10/2013   Patient on  Stimulants .  Marland Kitchen. Hypertension   . Narcotic addiction (HCC) 12/05/2014  . Obesity, unspecified 09/10/2013  . Shoulder joint pain    both shoulders     Assessment: 77 yo female with afib (CHADSVASC= 7; noted with CVA 04/2018). Pharmacy consulted to start apixaban -Wt= 80.9kg, SCr= 1.04  Goal of Therapy:  Monitor platelets by anticoagulation protocol: Yes   Plan:  -apixaban 5mg  po bid -Will follow patient progress  Harland GermanAndrew Bradie Lacock, PharmD Clinical Pharmacist Please check Amion for pharmacy contact number

## 2018-07-03 NOTE — Progress Notes (Signed)
PT Cancellation Note  Patient Details Name: [name] MRN: [id number] DOB: [birth year]   Cancelled Treatment:    Reason Eval/Treat Not Completed: Patient at procedure or test/unavailable(pt to leave for TEE/cardioversion and will hold at this time per nursing)   [name] 07/03/2018, 1:11 PM  [name], PT [phone]

## 2018-07-03 NOTE — CV Procedure (Signed)
Brief TEE Note  LVEF 55-60% Mild AR, MR and TR Small mobile thrombus noted in the left atrial appendage Mild atherosclerosis of the abdominal aorta and aortic arch  For additional details see full report.  [name] [name], MD, [facility]  07/03/2018 2:57 PM

## 2018-07-03 NOTE — Anesthesia Procedure Notes (Signed)
Procedure Name: General with mask airway Date/Time: 07/03/2018 2:39 PM Performed by: Elliot DallyHuggins, Kara Mierzejewski, CRNA Pre-anesthesia Checklist: Patient identified, Emergency Drugs available, Suction available, Patient being monitored and Timeout performed Patient Re-evaluated:Patient Re-evaluated prior to induction Oxygen Delivery Method: Nasal cannula Preoxygenation: Pre-oxygenation with 100% oxygen Induction Type: IV induction

## 2018-07-03 NOTE — Progress Notes (Addendum)
    Planning for TEE/DCCV on Monday 07/03/18 at 2pm with Dr. Duke Salviaandolph. Risks/benefits of procedure discussed with patient by Dr. Delton SeeNelson and she is in agreement to proceed. Orders place for NPO now.  Judy PimpleKrista Kalila Adkison, PA-C

## 2018-07-03 NOTE — Interval H&P Note (Signed)
History and Physical Interval Note:  07/03/2018 2:26 PM  [name]  has presented today for surgery, with the diagnosis of a fib  The various methods of treatment have been discussed with the patient and family. After consideration of risks, benefits and other options for treatment, the patient has consented to  Procedure(s): TRANSESOPHAGEAL ECHOCARDIOGRAM (TEE) (N/A) CARDIOVERSION (N/A) as a surgical intervention .  The patient's history has been reviewed, patient examined, no change in status, stable for surgery.  I have reviewed the patient's chart and labs.  Questions were answered to the patient's satisfaction.     Chilton Siiffany Waldron, MD

## 2018-07-03 NOTE — Progress Notes (Signed)
Patient returned to room via stretcher from TEE.

## 2018-07-03 NOTE — H&P (View-Only) (Signed)
Progress Note  Patient Name: [name] Date of Encounter: 07/03/2018  Primary Cardiologist: No primary care provider on file.   Subjective   Feels better today.  Inpatient Medications    Scheduled Meds: . apixaban  5 mg Oral BID  . atorvastatin  40 mg Oral q1800  . clopidogrel  75 mg Oral Daily  . erythromycin  1 application Right Eye BID  . fluorometholone  1 drop Right Eye QID  . hydrochlorothiazide  25 mg Oral Daily  . insulin aspart  0-9 Units Subcutaneous TID WC  . Insulin Glargine  18 Units Subcutaneous Q2200  . levothyroxine  50 mcg Oral QAC breakfast  . lisinopril  10-30 mg Oral BID  . loratadine  10 mg Oral Daily  . magnesium oxide  400 mg Oral BID  . metoprolol tartrate  25 mg Oral Q6H  . potassium chloride  10 mEq Oral Daily  . venlafaxine  100 mg Oral Daily  . Vitamin D3  1 capsule Oral Daily   Continuous Infusions:  PRN Meds: acetaminophen, amphetamine-dextroamphetamine, diazepam, hydrALAZINE, HYDROcodone-acetaminophen, metoprolol tartrate, ondansetron (ZOFRAN) IV, zolpidem   Vital Signs    Vitals:   07/03/18 0518 07/03/18 0556 07/03/18 0559 07/03/18 0816  BP: 134/73  (!) 153/87 (!) 158/91  Pulse: 77 (!) 103  (!) 107  Resp: 16  14 15   Temp:  97.9 F (36.6 C)  97.9 F (36.6 C)  TempSrc:  Oral  Oral  SpO2: 96% 98%  98%  Weight:  77.4 kg    Height:  5\' 3"  (1.6 m)      Intake/Output Summary (Last 24 hours) at 07/03/2018 0827 Last data filed at 07/03/2018 0700 Gross per 24 hour  Intake 0 ml  Output 0 ml  Net 0 ml   Filed Weights   07/02/18 2200 07/03/18 0556  Weight: 80.9 kg 77.4 kg    Telemetry    A-fib with RVR VR 100-115- Personally Reviewed  Physical Exam   GEN: No acute distress.   Neck: No JVD Cardiac: RRR, no murmurs, rubs, or gallops.  Respiratory: Clear to auscultation bilaterally. GI: Soft, nontender, non-distended  MS: No edema; No deformity. Neuro:  Nonfocal  Psych: Normal affect   Labs    Chemistry Recent Labs    Lab 07/02/18 1700 07/03/18 0451  NA 134* 140  K 3.5 3.6  CL 102 106  CO2 19* 20*  GLUCOSE 327* 172*  BUN 23 16  CREATININE 1.04* 0.76  CALCIUM 9.3 9.6  PROT 6.2*  --   ALBUMIN 3.5  --   AST 27  --   ALT 20  --   ALKPHOS 94  --   BILITOT 0.5  --   GFRNONAA 50* >60  GFRAA 58* >60  ANIONGAP 13 14     Hematology Recent Labs  Lab 07/02/18 1654 07/03/18 0451  WBC 9.9 9.8  RBC 4.62 4.66  HGB 12.6 12.8  HCT 39.8 40.0  MCV 86.1 85.8  MCH 27.3 27.5  MCHC 31.7 32.0  RDW 14.3 14.0  PLT 243 243    Cardiac Enzymes Recent Labs  Lab 07/02/18 1929 07/02/18 2346 07/03/18 0451  TROPONINI <0.03 <0.03 <0.03   No results for input(s): TROPIPOC in the last 168 hours.   BNP Recent Labs  Lab 07/02/18 2346  BNP 70.7     DDimer No results for input(s): DDIMER in the last 168 hours.   Radiology    Dg Chest 2 View  Result Date: 07/02/2018 CLINICAL  DATA:  Hypertension and atrial fibrillation EXAM: CHEST - 2 VIEW COMPARISON:  January 09, 2018 FINDINGS: No edema or consolidation. The heart size and pulmonary vascularity are normal. No adenopathy. There is aortic atherosclerosis. There is degenerative change in each shoulder with suspected avascular necrosis in the superior right humeral head region. IMPRESSION: No edema or consolidation. Stable cardiac silhouette. There is aortic atherosclerosis. Suspect a degree of avascular necrosis in the superior right humeral head. Aortic Atherosclerosis (ICD10-I70.0). Electronically Signed   By: Bretta Bang III M.D.   On: 07/02/2018 17:44    Cardiac Studies   TTE; 05/19/2018 Left ventricle: The cavity size was normal. Wall thickness was   increased in a pattern of mild LVH. Systolic function was normal.   The estimated ejection fraction was in the range of 60% to 65%.   Wall motion was normal; there were no regional wall motion   abnormalities. Features are consistent with a pseudonormal left   ventricular filling pattern, with  concomitant abnormal relaxation   and increased filling pressure (grade 2 diastolic dysfunction). - Mitral valve: There was moderate regurgitation. - Pulmonary arteries: Systolic pressure was moderately increased.   PA peak pressure: 50 mm Hg (S).  Patient Profile     77 y.o. female  with a history of recent ischemic stroke in June 2019 with residual slurred speech and word finding difficulty (04/2018), reported history of coronary artery disease, chronic pain syndrome, who presents with a a-fib with RVR and hypertensive urgency.   Assessment & Plan    1. New onset atrial fibrillation with RVR 2. Initiation of anticoagulation CHADS-VASc 7 3. Hypertensive urgency 4. Recent stroke   Plan: TEE/DCCV planned for today. Continue Eliquis, increase metoprolol to 50 mg PO BID, continue uptitrating lisinopril.   For questions or updates, please contact [facility] Please consult [email or web address] for contact info under Cardiology/STEMI.      Signed, Tobias Alexander, MD  07/03/2018, 8:27 AM

## 2018-07-03 NOTE — Progress Notes (Addendum)
PROGRESS NOTE   [name]  ZOX:[id number]    DOB: [birth year]    DOA: 07/02/2018  PCP: Sebastian Ache, PA-C   I have briefly reviewed patients previous medical records in [facility].  Brief Narrative:  77 year old married female with PMH of CAD status post stent, DM 2, HTN, HLD, obesity, chronic pain with narcotic dependence, anxiety, depression, right-sided Bell's palsy, recent hospitalization 6/25-6/26 for suspected left brain infarct versus TIA with residual dysarthria and expressive aphasia presented to the ED after home health nurse found her to have elevated BP and irregular tachycardia and noted to have A. fib with RVR.  She recently had self-limiting nausea, vomiting and diarrhea.  Cardiology consulted.   Assessment & Plan:   Principal Problem:   Atrial fibrillation with RVR (HCC) Active Problems:   CAD (coronary artery disease) status post stent to distal right coronary artery 2006   Essential hypertension   Hyperlipidemia   Anxiety   Hypersomnia, persistent   Type 2 diabetes mellitus without complication, with long-term current use of insulin (HCC)   Aphasia   Stroke (cerebrum) (HCC)   Hypothyroidism   Chronic diastolic CHF (congestive heart failure) (HCC)   New onset A. fib with RVR: Cardiology consultation and follow-up appreciated.  Atenolol discontinued.  Metoprolol started at 25 mg every 6 hourly.  Eliquis started.  Cardiology plans for TEE/DCCV at 2 PM today.  Rate better controlled.  TSH and free T4 normal.  I discussed with Dr. Delton See who recommends continuing current dose of metoprolol without change.  Hypertensive urgency: Presented with blood pressure of 218/102.  Continue HCTZ 25 mg daily, lisinopril (need to clarify home dose-pharmacy consulted) and metoprolol.  Blood pressures are better.  PRN IV hydralazine.  CAD status post PCI to distal RCA 2006: No chest pain.  Troponin cycled and negative.  Continue Plavix and  statins.  Hyperlipidemia: Continue Lipitor.  Anxiety: Continue home dose of Valium.  Hypersomnia: Remains on home Adderall.  DM2 with vascular complications: Last A1c 9.3 on 05/19/2018.  Patient has been placed on reduced dose of Lantus and SSI.  Monitor CBGs and adjust insulins as needed.  Recent CVA with dysarthria and expressive aphasia: Continue Plavix and atorvastatin.  Patient's recent CVA was possibly related to A. fib.  Now started on Eliquis.  Hypothyroid: TSH normal.  Continue Synthroid.  Chronic diastolic CHF: TTE 6/25: LVEF 60 to 65% and grade 2 diastolic dysfunction.  Compensated.  Possible avascular necrosis in the superior right humeral head: Noted on chest x-ray.  Outpatient follow-up as deemed necessary.   DVT prophylaxis: Started on Eliquis anticoagulation for A. fib. Code Status: Full Family Communication: None at bedside Disposition: DC home pending clinical improvement   Consultants:  Cardiology  Procedures:  None  Antimicrobials:  None   Subjective: Patient reports pain at site of lab draw from right wrist.  Denies palpitations, dyspnea or chest pain.  Although she has expressive aphasia, she is able to clearly tell her complaints although slowly.  As per RN, no acute issues noted.  Swallowing without difficulty.  ROS: As above, otherwise negative.  Objective:  Vitals:   07/03/18 0518 07/03/18 0556 07/03/18 0559 07/03/18 0816  BP: 134/73  (!) 153/87 (!) 158/91  Pulse: 77 (!) 103  (!) 107  Resp: 16  14 15   Temp:  97.9 F (36.6 C)  97.9 F (36.6 C)  TempSrc:  Oral  Oral  SpO2: 96% 98%  98%  Weight:  77.4 kg  Height:  5\' 3"  (1.6 m)      Examination:  General exam: Pleasant elderly female, moderately built and nourished lying comfortably propped up in bed.  Oral mucosa moist. Respiratory system: Clear to auscultation. Respiratory effort normal. Cardiovascular system: S1 & S2 heard, irregularly irregular and tachycardic. No JVD, murmurs,  rubs, gallops or clicks. No pedal edema.  Telemetry personally reviewed: Initially had come in sinus rhythm but then changed to A. fib with RVR in the 130s.  Now down to A. fib in the 90s. Gastrointestinal system: Abdomen is nondistended, soft and nontender. No organomegaly or masses felt. Normal bowel sounds heard. Central nervous system: Alert and oriented x3.  Mild dysarthria +.  Expressive aphasia +.  Facial asymmetry present?  Related to prior right Bell's palsy.  No pronator drift. Extremities: Symmetric 5 x 5 power. Skin: No rashes, lesions or ulcers Psychiatry: Judgement and insight appear normal. Mood & affect appropriate.     Data Reviewed: I have personally reviewed following labs and imaging studies  CBC: Recent Labs  Lab 07/02/18 1654 07/03/18 0451  WBC 9.9 9.8  HGB 12.6 12.8  HCT 39.8 40.0  MCV 86.1 85.8  PLT 243 243   Basic Metabolic Panel: Recent Labs  Lab 07/02/18 1700 07/03/18 0451  NA 134* 140  K 3.5 3.6  CL 102 106  CO2 19* 20*  GLUCOSE 327* 172*  BUN 23 16  CREATININE 1.04* 0.76  CALCIUM 9.3 9.6   Liver Function Tests: Recent Labs  Lab 07/02/18 1700  AST 27  ALT 20  ALKPHOS 94  BILITOT 0.5  PROT 6.2*  ALBUMIN 3.5   Cardiac Enzymes: Recent Labs  Lab 07/02/18 1929 07/02/18 2346 07/03/18 0451  TROPONINI <0.03 <0.03 <0.03   CBG: Recent Labs  Lab 07/03/18 0802  GLUCAP 177*    Recent Results (from the past 240 hour(s))  MRSA PCR Screening     Status: None   Collection Time: 07/03/18  5:59 AM  Result Value Ref Range Status   MRSA by PCR NEGATIVE NEGATIVE Final    Comment:        The GeneXpert MRSA Assay (FDA approved for NASAL specimens only), is one component of a comprehensive MRSA colonization surveillance program. It is not intended to diagnose MRSA infection nor to guide or monitor treatment for MRSA infections. Performed at [facility], [address] [place], [place] [address]          Radiology  Studies: Dg Chest 2 View  Result Date: 07/02/2018 CLINICAL DATA:  Hypertension and atrial fibrillation EXAM: CHEST - 2 VIEW COMPARISON:  January 09, 2018 FINDINGS: No edema or consolidation. The heart size and pulmonary vascularity are normal. No adenopathy. There is aortic atherosclerosis. There is degenerative change in each shoulder with suspected avascular necrosis in the superior right humeral head region. IMPRESSION: No edema or consolidation. Stable cardiac silhouette. There is aortic atherosclerosis. Suspect a degree of avascular necrosis in the superior right humeral head. Aortic Atherosclerosis (ICD10-I70.0). Electronically Signed   By: Bretta Bang III M.D.   On: 07/02/2018 17:44        Scheduled Meds: . apixaban  5 mg Oral BID  . atorvastatin  40 mg Oral q1800  . clopidogrel  75 mg Oral Daily  . erythromycin  1 application Right Eye BID  . fluorometholone  1 drop Right Eye QID  . hydrochlorothiazide  25 mg Oral Daily  . insulin aspart  0-9 Units Subcutaneous TID WC  . Insulin  Glargine  18 Units Subcutaneous Q2200  . levothyroxine  50 mcg Oral QAC breakfast  . lisinopril  10-30 mg Oral BID  . loratadine  10 mg Oral Daily  . magnesium oxide  400 mg Oral BID  . metoprolol tartrate  25 mg Oral Q6H  . potassium chloride  10 mEq Oral Daily  . sodium chloride flush  3 mL Intravenous Q12H  . venlafaxine  100 mg Oral Daily  . Vitamin D3  1 capsule Oral Daily   Continuous Infusions:   LOS: 0 days     Marcellus ScottAnand Sola Margolis, MD, FACP, [facility]. [facility] Pager [phone]  If 7PM-7AM, please contact night-coverage [email or web address] Password [email or web address] 07/03/2018, 10:52 AM

## 2018-07-03 NOTE — Progress Notes (Addendum)
Progress Note  Patient Name: [name] Date of Encounter: 07/03/2018  Primary Cardiologist: No primary care provider on file.   Subjective   Feels better today.  Inpatient Medications    Scheduled Meds: . apixaban  5 mg Oral BID  . atorvastatin  40 mg Oral q1800  . clopidogrel  75 mg Oral Daily  . erythromycin  1 application Right Eye BID  . fluorometholone  1 drop Right Eye QID  . hydrochlorothiazide  25 mg Oral Daily  . insulin aspart  0-9 Units Subcutaneous TID WC  . Insulin Glargine  18 Units Subcutaneous Q2200  . levothyroxine  50 mcg Oral QAC breakfast  . lisinopril  10-30 mg Oral BID  . loratadine  10 mg Oral Daily  . magnesium oxide  400 mg Oral BID  . metoprolol tartrate  25 mg Oral Q6H  . potassium chloride  10 mEq Oral Daily  . venlafaxine  100 mg Oral Daily  . Vitamin D3  1 capsule Oral Daily   Continuous Infusions:  PRN Meds: acetaminophen, amphetamine-dextroamphetamine, diazepam, hydrALAZINE, HYDROcodone-acetaminophen, metoprolol tartrate, ondansetron (ZOFRAN) IV, zolpidem   Vital Signs    Vitals:   07/03/18 0518 07/03/18 0556 07/03/18 0559 07/03/18 0816  BP: 134/73  (!) 153/87 (!) 158/91  Pulse: 77 (!) 103  (!) 107  Resp: 16  14 15   Temp:  97.9 F (36.6 C)  97.9 F (36.6 C)  TempSrc:  Oral  Oral  SpO2: 96% 98%  98%  Weight:  77.4 kg    Height:  5\' 3"  (1.6 m)      Intake/Output Summary (Last 24 hours) at 07/03/2018 0827 Last data filed at 07/03/2018 0700 Gross per 24 hour  Intake 0 ml  Output 0 ml  Net 0 ml   Filed Weights   07/02/18 2200 07/03/18 0556  Weight: 80.9 kg 77.4 kg    Telemetry    A-fib with RVR VR 100-115- Personally Reviewed  Physical Exam   GEN: No acute distress.   Neck: No JVD Cardiac: RRR, no murmurs, rubs, or gallops.  Respiratory: Clear to auscultation bilaterally. GI: Soft, nontender, non-distended  MS: No edema; No deformity. Neuro:  Nonfocal  Psych: Normal affect   Labs    Chemistry Recent Labs    Lab 07/02/18 1700 07/03/18 0451  NA 134* 140  K 3.5 3.6  CL 102 106  CO2 19* 20*  GLUCOSE 327* 172*  BUN 23 16  CREATININE 1.04* 0.76  CALCIUM 9.3 9.6  PROT 6.2*  --   ALBUMIN 3.5  --   AST 27  --   ALT 20  --   ALKPHOS 94  --   BILITOT 0.5  --   GFRNONAA 50* >60  GFRAA 58* >60  ANIONGAP 13 14     Hematology Recent Labs  Lab 07/02/18 1654 07/03/18 0451  WBC 9.9 9.8  RBC 4.62 4.66  HGB 12.6 12.8  HCT 39.8 40.0  MCV 86.1 85.8  MCH 27.3 27.5  MCHC 31.7 32.0  RDW 14.3 14.0  PLT 243 243    Cardiac Enzymes Recent Labs  Lab 07/02/18 1929 07/02/18 2346 07/03/18 0451  TROPONINI <0.03 <0.03 <0.03   No results for input(s): TROPIPOC in the last 168 hours.   BNP Recent Labs  Lab 07/02/18 2346  BNP 70.7     DDimer No results for input(s): DDIMER in the last 168 hours.   Radiology    Dg Chest 2 View  Result Date: 07/02/2018 CLINICAL  DATA:  Hypertension and atrial fibrillation EXAM: CHEST - 2 VIEW COMPARISON:  January 09, 2018 FINDINGS: No edema or consolidation. The heart size and pulmonary vascularity are normal. No adenopathy. There is aortic atherosclerosis. There is degenerative change in each shoulder with suspected avascular necrosis in the superior right humeral head region. IMPRESSION: No edema or consolidation. Stable cardiac silhouette. There is aortic atherosclerosis. Suspect a degree of avascular necrosis in the superior right humeral head. Aortic Atherosclerosis (ICD10-I70.0). Electronically Signed   By: Bretta Bang III M.D.   On: 07/02/2018 17:44    Cardiac Studies   TTE; 05/19/2018 Left ventricle: The cavity size was normal. Wall thickness was   increased in a pattern of mild LVH. Systolic function was normal.   The estimated ejection fraction was in the range of 60% to 65%.   Wall motion was normal; there were no regional wall motion   abnormalities. Features are consistent with a pseudonormal left   ventricular filling pattern, with  concomitant abnormal relaxation   and increased filling pressure (grade 2 diastolic dysfunction). - Mitral valve: There was moderate regurgitation. - Pulmonary arteries: Systolic pressure was moderately increased.   PA peak pressure: 50 mm Hg (S).  Patient Profile     77 y.o. female  with a history of recent ischemic stroke in June 2019 with residual slurred speech and word finding difficulty (04/2018), reported history of coronary artery disease, chronic pain syndrome, who presents with a a-fib with RVR and hypertensive urgency.   Assessment & Plan    1. New onset atrial fibrillation with RVR 2. Initiation of anticoagulation CHADS-VASc 7 3. Hypertensive urgency 4. Recent stroke   Plan: TEE/DCCV planned for today. Continue Eliquis, increase metoprolol to 50 mg PO BID, continue uptitrating lisinopril.   For questions or updates, please contact [facility] Please consult [email or web address] for contact info under Cardiology/STEMI.      Signed, Tobias Alexander, MD  07/03/2018, 8:27 AM

## 2018-07-03 NOTE — Progress Notes (Signed)
Patient taken by transporter RN via wheelchair for TEE and possible cardioversion.

## 2018-07-04 DIAGNOSIS — I110 Hypertensive heart disease with heart failure: Secondary | ICD-10-CM | POA: Diagnosis present

## 2018-07-04 DIAGNOSIS — I10 Essential (primary) hypertension: Secondary | ICD-10-CM | POA: Diagnosis not present

## 2018-07-04 DIAGNOSIS — E1159 Type 2 diabetes mellitus with other circulatory complications: Secondary | ICD-10-CM | POA: Diagnosis present

## 2018-07-04 DIAGNOSIS — G894 Chronic pain syndrome: Secondary | ICD-10-CM | POA: Diagnosis present

## 2018-07-04 DIAGNOSIS — Z794 Long term (current) use of insulin: Secondary | ICD-10-CM

## 2018-07-04 DIAGNOSIS — R739 Hyperglycemia, unspecified: Secondary | ICD-10-CM | POA: Diagnosis not present

## 2018-07-04 DIAGNOSIS — I639 Cerebral infarction, unspecified: Secondary | ICD-10-CM | POA: Diagnosis not present

## 2018-07-04 DIAGNOSIS — M879 Osteonecrosis, unspecified: Secondary | ICD-10-CM | POA: Diagnosis present

## 2018-07-04 DIAGNOSIS — I2510 Atherosclerotic heart disease of native coronary artery without angina pectoris: Secondary | ICD-10-CM | POA: Diagnosis present

## 2018-07-04 DIAGNOSIS — I69328 Other speech and language deficits following cerebral infarction: Secondary | ICD-10-CM | POA: Diagnosis not present

## 2018-07-04 DIAGNOSIS — I513 Intracardiac thrombosis, not elsewhere classified: Secondary | ICD-10-CM | POA: Diagnosis present

## 2018-07-04 DIAGNOSIS — E1165 Type 2 diabetes mellitus with hyperglycemia: Secondary | ICD-10-CM | POA: Diagnosis present

## 2018-07-04 DIAGNOSIS — R4701 Aphasia: Secondary | ICD-10-CM

## 2018-07-04 DIAGNOSIS — Y92009 Unspecified place in unspecified non-institutional (private) residence as the place of occurrence of the external cause: Secondary | ICD-10-CM | POA: Diagnosis not present

## 2018-07-04 DIAGNOSIS — T45526A Underdosing of antithrombotic drugs, initial encounter: Secondary | ICD-10-CM | POA: Diagnosis present

## 2018-07-04 DIAGNOSIS — F419 Anxiety disorder, unspecified: Secondary | ICD-10-CM | POA: Diagnosis present

## 2018-07-04 DIAGNOSIS — I4891 Unspecified atrial fibrillation: Secondary | ICD-10-CM | POA: Diagnosis present

## 2018-07-04 DIAGNOSIS — G4710 Hypersomnia, unspecified: Secondary | ICD-10-CM

## 2018-07-04 DIAGNOSIS — I69392 Facial weakness following cerebral infarction: Secondary | ICD-10-CM | POA: Diagnosis not present

## 2018-07-04 DIAGNOSIS — Z91128 Patient's intentional underdosing of medication regimen for other reason: Secondary | ICD-10-CM | POA: Diagnosis not present

## 2018-07-04 DIAGNOSIS — I672 Cerebral atherosclerosis: Secondary | ICD-10-CM | POA: Diagnosis present

## 2018-07-04 DIAGNOSIS — N179 Acute kidney failure, unspecified: Secondary | ICD-10-CM | POA: Diagnosis present

## 2018-07-04 DIAGNOSIS — E039 Hypothyroidism, unspecified: Secondary | ICD-10-CM | POA: Diagnosis present

## 2018-07-04 DIAGNOSIS — I700 Atherosclerosis of aorta: Secondary | ICD-10-CM | POA: Diagnosis present

## 2018-07-04 DIAGNOSIS — I69320 Aphasia following cerebral infarction: Secondary | ICD-10-CM | POA: Diagnosis not present

## 2018-07-04 DIAGNOSIS — I160 Hypertensive urgency: Secondary | ICD-10-CM | POA: Diagnosis present

## 2018-07-04 DIAGNOSIS — Z7902 Long term (current) use of antithrombotics/antiplatelets: Secondary | ICD-10-CM | POA: Diagnosis not present

## 2018-07-04 DIAGNOSIS — E119 Type 2 diabetes mellitus without complications: Secondary | ICD-10-CM

## 2018-07-04 DIAGNOSIS — I5032 Chronic diastolic (congestive) heart failure: Secondary | ICD-10-CM | POA: Diagnosis not present

## 2018-07-04 DIAGNOSIS — I482 Chronic atrial fibrillation, unspecified: Secondary | ICD-10-CM | POA: Diagnosis present

## 2018-07-04 DIAGNOSIS — E785 Hyperlipidemia, unspecified: Secondary | ICD-10-CM | POA: Diagnosis not present

## 2018-07-04 DIAGNOSIS — I69322 Dysarthria following cerebral infarction: Secondary | ICD-10-CM | POA: Diagnosis not present

## 2018-07-04 LAB — GLUCOSE, CAPILLARY
GLUCOSE-CAPILLARY: 171 mg/dL — AB (ref 70–99)
GLUCOSE-CAPILLARY: 178 mg/dL — AB (ref 70–99)
GLUCOSE-CAPILLARY: 280 mg/dL — AB (ref 70–99)
Glucose-Capillary: 162 mg/dL — ABNORMAL HIGH (ref 70–99)
Glucose-Capillary: 300 mg/dL — ABNORMAL HIGH (ref 70–99)

## 2018-07-04 LAB — URINE CULTURE: CULTURE: NO GROWTH

## 2018-07-04 LAB — T3, FREE: T3, Free: 2.9 pg/mL (ref 2.0–4.4)

## 2018-07-04 MED ORDER — ASPIRIN EC 81 MG PO TBEC
81.0000 mg | DELAYED_RELEASE_TABLET | Freq: Every day | ORAL | Status: DC
  Administered 2018-07-05: 81 mg via ORAL
  Filled 2018-07-04 (×2): qty 1

## 2018-07-04 MED ORDER — METOPROLOL TARTRATE 50 MG PO TABS
50.0000 mg | ORAL_TABLET | Freq: Two times a day (BID) | ORAL | Status: DC
  Administered 2018-07-04 – 2018-07-05 (×2): 50 mg via ORAL
  Filled 2018-07-04 (×2): qty 1

## 2018-07-04 NOTE — Progress Notes (Addendum)
PROGRESS NOTE   [name]  WJX:[id number]    DOB: [birth year]    DOA: 07/02/2018  PCP: Sebastian AcheMcVey, Elizabeth Whitney, PA-C   I have briefly reviewed patients previous medical records in [facility].  Brief Narrative:  77 year old married female with PMH of CAD status post stent, DM 2, HTN, HLD, obesity, chronic pain with narcotic dependence, anxiety, depression, right-sided Bell's palsy, recent hospitalization 6/25-6/26 for suspected left brain infarct versus TIA with residual dysarthria and expressive aphasia presented to the ED after home health nurse found her to have elevated BP and irregular tachycardia and noted to have A. fib with RVR.  She recently had self-limiting nausea, vomiting and diarrhea.  Cardiology consulted.  Initiated on metoprolol for rate control and Eliquis anticoagulation.  DCCV attempt on 8/9 aborted because TEE showed small mobile thrombus in the left atrial appendage.   Assessment & Plan:   Principal Problem:   Atrial fibrillation with RVR (HCC) Active Problems:   CAD (coronary artery disease) status post stent to distal right coronary artery 2006   Essential hypertension   Hyperlipidemia   Anxiety   Hypersomnia, persistent   Type 2 diabetes mellitus without complication, with long-term current use of insulin (HCC)   Aphasia   Stroke (cerebrum) (HCC)   Hypothyroidism   Chronic diastolic CHF (congestive heart failure) (HCC)   New onset A. fib with RVR: Cardiology consultation and follow-up appreciated.  Atenolol discontinued.  Metoprolol started at 25 mg every 6 hourly >?  Change to Toprol-XL (defer to cardiology).  Eliquis started.  TSH, free T4 and free T3 normal.  A. fib now with controlled ventricular rate.  DCCV attempt on 8/9 aborted because TEE showed small mobile thrombus in the left atrial appendage.  Hypertensive urgency: Presented with blood pressure of 218/102.  Continue HCTZ 25 mg daily, lisinopril at prior home dose 20 mg in a.m. and 10 mg in  p.m. and metoprolol (atenolol discontinued).  Controlled.  PRN IV hydralazine.  CAD status post PCI to distal RCA 2006: No chest pain.  Troponin cycled and negative.  Continue metoprolol and statin.  As confirmed with patient, remote stent placement.  As discussed with neurology on 8/10, changing Plavix to aspirin 81 mg daily due to significant extra and intracranial cerebral atherosclerosis and suspected recent stroke.  Hyperlipidemia: Continue Lipitor.  Anxiety: Continue home dose of Valium.  Hypersomnia: As per pharmacy verification, patient apparently was not taking Adderall PTA and was discontinued.  DM2 with vascular complications: Last A1c 9.3 on 05/19/2018.  Patient has been placed on reduced dose of Lantus and SSI.  Monitor CBGs and adjust insulins as needed.  CBGs mildly uncontrolled and fluctuating but acceptable and no change in management.  Recent CVA with dysarthria and expressive aphasia: Continue atorvastatin.  Patient's recent CVA was possibly related to A. fib.  Now started on Eliquis.  As per patient, she stopped taking Plavix after her prescription from recent discharge ran out and did not know that she was supposed to continue it.  I discussed with neurologist on 8/10 who recommended changing Plavix to aspirin 81 mg daily along with Eliquis.  Aspirin recommended due to extra and intracranial significant atherosclerosis.  Hypothyroid: Full TFTs normal.  Continue Synthroid.  Chronic diastolic CHF: TTE 6/25: LVEF 60 to 65% and grade 2 diastolic dysfunction.  Compensated.  TTE 8/9: LVEF 55-60%, mild AR, MR and TR.  Small mobile thrombus noted in the left atrial appendage.  Possible avascular necrosis in the superior right humeral  head: Noted on chest x-ray.  Outpatient follow-up as deemed necessary.  Chronic pain: Judicious use of pain medications.  Outpatient follow-up with pain MD.   DVT prophylaxis: Started on Eliquis anticoagulation for A. fib. Code Status: Full Family  Communication: I discussed in detail with patient's spouse, updated care and answered questions. Disposition: DC home pending clinical improvement and cardiology clearance.  Will need home health PT and 24-hour supervision and assistance at discharge as per PT recommendations.  Patient has multiple significant comorbidities, most significantly recent stroke with significant residual expressive aphasia, HTN, CAD, HLD, DM 2, who now presented with A. fib with RVR which could have been the cause of her recent embolic stroke.  Cardiology consulted and TEE shows LAA thrombus.  Although her rate control is better, she needs further management of her medications (possibly transitioning to Toprol-XL) and closely monitoring overnight (would be second midnight stay) to ensure that her rate is adequately controlled otherwise she is at risk for decline and rehospitalization.  Thereby will change her to inpatient status.   Consultants:  Cardiology  Procedures:  TEE 07/03/2018  Antimicrobials:  None   Subjective: Patient expresses frustration due to her speech difficulty but able to communicate well although slowly.  No pain reported.  Indicates that she completed Plavix prescription recommended at discharge and then did not think that she needed to refill it.  She had been calling neurologist for follow-up but did not have a date yet.  No palpitations, chest pain or dyspnea reported.  ROS: As above, otherwise negative.  Objective:  Vitals:   07/04/18 0500 07/04/18 0712 07/04/18 0816 07/04/18 1041  BP: 136/78  (!) 133/51 (!) 130/52  Pulse: 91 94 90 83  Resp:   12 14  Temp: 98 F (36.7 C)  98.1 F (36.7 C) 98 F (36.7 C)  TempSrc: Oral  Oral Oral  SpO2: 99%  100% 96%  Weight:      Height:        Examination:  General exam: Pleasant elderly female, moderately built and nourished lying comfortably propped up in bed.  Oral mucosa moist.  Stable Respiratory system: Clear to auscultation.  Respiratory effort normal.  Stable Cardiovascular system: S1 & S2 heard, irregularly irregular. No JVD, murmurs, rubs, gallops or clicks. No pedal edema.  Telemetry personally reviewed: Controlled ventricular rate in the 80s with occasional PVCs. Gastrointestinal system: Abdomen is nondistended, soft and nontender. No organomegaly or masses felt. Normal bowel sounds heard.  Stable Central nervous system: Alert and oriented x3.  Mild dysarthria +.  Expressive aphasia +.  Facial asymmetry present?  Related to prior right Bell's palsy, right sided facial weakness.  No pronator drift. Extremities: Symmetric 5 x 5 power. Skin: No rashes, lesions or ulcers Psychiatry: Judgement and insight appear normal. Mood & affect appropriate.     Data Reviewed: I have personally reviewed following labs and imaging studies  CBC: Recent Labs  Lab 07/02/18 1654 07/03/18 0451  WBC 9.9 9.8  HGB 12.6 12.8  HCT 39.8 40.0  MCV 86.1 85.8  PLT 243 243   Basic Metabolic Panel: Recent Labs  Lab 07/02/18 1700 07/03/18 0451  NA 134* 140  K 3.5 3.6  CL 102 106  CO2 19* 20*  GLUCOSE 327* 172*  BUN 23 16  CREATININE 1.04* 0.76  CALCIUM 9.3 9.6   Liver Function Tests: Recent Labs  Lab 07/02/18 1700  AST 27  ALT 20  ALKPHOS 94  BILITOT 0.5  PROT 6.2*  ALBUMIN 3.5  Cardiac Enzymes: Recent Labs  Lab 07/02/18 1929 07/02/18 2346 07/03/18 0451 07/03/18 0934  TROPONINI <0.03 <0.03 <0.03 <0.03   CBG: Recent Labs  Lab 07/03/18 1219 07/03/18 1706 07/03/18 2134 07/04/18 0630 07/04/18 0814  GLUCAP 148* 139* 188* 171* 162*    Recent Results (from the past 240 hour(s))  MRSA PCR Screening     Status: None   Collection Time: 07/03/18  5:59 AM  Result Value Ref Range Status   MRSA by PCR NEGATIVE NEGATIVE Final    Comment:        The GeneXpert MRSA Assay (FDA approved for NASAL specimens only), is one component of a comprehensive MRSA colonization surveillance program. It is not intended  to diagnose MRSA infection nor to guide or monitor treatment for MRSA infections. Performed at [facility], [address], [place], [place] [address]          Radiology Studies: Dg Chest 2 View  Result Date: 07/02/2018 CLINICAL DATA:  Hypertension and atrial fibrillation EXAM: CHEST - 2 VIEW COMPARISON:  January 09, 2018 FINDINGS: No edema or consolidation. The heart size and pulmonary vascularity are normal. No adenopathy. There is aortic atherosclerosis. There is degenerative change in each shoulder with suspected avascular necrosis in the superior right humeral head region. IMPRESSION: No edema or consolidation. Stable cardiac silhouette. There is aortic atherosclerosis. Suspect a degree of avascular necrosis in the superior right humeral head. Aortic Atherosclerosis (ICD10-I70.0). Electronically Signed   By: Bretta Bang III M.D.   On: 07/02/2018 17:44        Scheduled Meds: . apixaban  5 mg Oral BID  . atorvastatin  40 mg Oral q1800  . cholecalciferol  5,000 Units Oral Daily  . clopidogrel  75 mg Oral Daily  . hydrochlorothiazide  25 mg Oral Daily  . insulin aspart  0-9 Units Subcutaneous TID WC  . insulin glargine  18 Units Subcutaneous Q2200  . levothyroxine  50 mcg Oral QAC breakfast  . lisinopril  20 mg Oral BH-q7a   And  . lisinopril  10 mg Oral QPM  . loratadine  10 mg Oral Daily  . magnesium oxide  400 mg Oral BID  . metoprolol tartrate  25 mg Oral Q6H  . potassium chloride  10 mEq Oral Daily  . sodium chloride flush  3 mL Intravenous Q12H  . venlafaxine  100 mg Oral Daily   Continuous Infusions:   LOS: 0 days     Marcellus Scott, MD, FACP, [facility]. [facility] Pager [phone]  If 7PM-7AM, please contact night-coverage [email or web address] Password [email or web address] 07/04/2018, 11:08 AM

## 2018-07-04 NOTE — Progress Notes (Signed)
I have sent a message to our office's scheduling team requesting a follow-up appointment, and our office will call the patient with this information.  Larkyn Greenberger PA-C  

## 2018-07-04 NOTE — Progress Notes (Addendum)
Progress Note  Patient Name: Foye DeerJanice B Keimig Date of Encounter: 07/04/2018  Primary Cardiologist: Dr Royann Shiversroitoru  Subjective   No cardiac complaints.   Inpatient Medications    Scheduled Meds: . apixaban  5 mg Oral BID  . atorvastatin  40 mg Oral q1800  . cholecalciferol  5,000 Units Oral Daily  . clopidogrel  75 mg Oral Daily  . hydrochlorothiazide  25 mg Oral Daily  . insulin aspart  0-9 Units Subcutaneous TID WC  . insulin glargine  18 Units Subcutaneous Q2200  . levothyroxine  50 mcg Oral QAC breakfast  . lisinopril  20 mg Oral BH-q7a   And  . lisinopril  10 mg Oral QPM  . loratadine  10 mg Oral Daily  . magnesium oxide  400 mg Oral BID  . metoprolol tartrate  25 mg Oral Q6H  . potassium chloride  10 mEq Oral Daily  . sodium chloride flush  3 mL Intravenous Q12H  . venlafaxine  100 mg Oral Daily   Continuous Infusions:  PRN Meds: acetaminophen, diazepam, hydrALAZINE, HYDROcodone-acetaminophen, metoprolol tartrate, ondansetron (ZOFRAN) IV, sodium chloride flush, zolpidem   Vital Signs    Vitals:   07/04/18 0500 07/04/18 0712 07/04/18 0816 07/04/18 1041  BP: 136/78  (!) 133/51 (!) 130/52  Pulse: 91 94 90 83  Resp:   12 14  Temp: 98 F (36.7 C)  98.1 F (36.7 C) 98 F (36.7 C)  TempSrc: Oral  Oral Oral  SpO2: 99%  100% 96%  Weight:      Height:        Intake/Output Summary (Last 24 hours) at 07/04/2018 1108 Last data filed at 07/04/2018 1041 Gross per 24 hour  Intake 1023 ml  Output 0 ml  Net 1023 ml   Filed Weights   07/02/18 2200 07/03/18 0556  Weight: 80.9 kg 77.4 kg    Telemetry    afib rates 80s - Personally Reviewed  ECG    na - Personally Reviewed  Physical Exam   GEN: No acute distress.   Neck: No JVD Cardiac: irreg, no murmurs, rubs, or gallops.  Respiratory: Clear to auscultation bilaterally. GI: Soft, nontender, non-distended  MS: No edema; No deformity. Psych: Normal affect   Labs    Chemistry Recent Labs  Lab  07/02/18 1700 07/03/18 0451  NA 134* 140  K 3.5 3.6  CL 102 106  CO2 19* 20*  GLUCOSE 327* 172*  BUN 23 16  CREATININE 1.04* 0.76  CALCIUM 9.3 9.6  PROT 6.2*  --   ALBUMIN 3.5  --   AST 27  --   ALT 20  --   ALKPHOS 94  --   BILITOT 0.5  --   GFRNONAA 50* >60  GFRAA 58* >60  ANIONGAP 13 14     Hematology Recent Labs  Lab 07/02/18 1654 07/03/18 0451  WBC 9.9 9.8  RBC 4.62 4.66  HGB 12.6 12.8  HCT 39.8 40.0  MCV 86.1 85.8  MCH 27.3 27.5  MCHC 31.7 32.0  RDW 14.3 14.0  PLT 243 243    Cardiac Enzymes Recent Labs  Lab 07/02/18 1929 07/02/18 2346 07/03/18 0451 07/03/18 0934  TROPONINI <0.03 <0.03 <0.03 <0.03   No results for input(s): TROPIPOC in the last 168 hours.   BNP Recent Labs  Lab 07/02/18 2346  BNP 70.7     DDimer No results for input(s): DDIMER in the last 168 hours.   Radiology    Dg Chest 2 View  Result  Date: 07/02/2018 CLINICAL DATA:  Hypertension and atrial fibrillation EXAM: CHEST - 2 VIEW COMPARISON:  January 09, 2018 FINDINGS: No edema or consolidation. The heart size and pulmonary vascularity are normal. No adenopathy. There is aortic atherosclerosis. There is degenerative change in each shoulder with suspected avascular necrosis in the superior right humeral head region. IMPRESSION: No edema or consolidation. Stable cardiac silhouette. There is aortic atherosclerosis. Suspect a degree of avascular necrosis in the superior right humeral head. Aortic Atherosclerosis (ICD10-I70.0). Electronically Signed   By: Bretta Bang III M.D.   On: 07/02/2018 17:44    Cardiac Studies    Patient Profile     77 y.o. female with a history of recent ischemic stroke in June 2019 with residual slurred speech and word finding difficulty(04/2018), reported history of coronary artery disease, chronic pain syndrome, who presents with a a-fib with RVR and hypertensive urgency.   Assessment & Plan    1. New onset afib with RVR - s/p TEE 07/03/18, had a  LA thrombus. Did not undergo cardioversion.  - started on eliquis, lopressor 25mg  every 6 hours. CHADS2Vasc score is 7 - given cerebral atherosclersos neuro has recommended ASA along with eliquis.   - rate controlled this AM. Lopressor 25mg  q6 hrs, would change to 50mg  for more convenient dosing. Reevaluate at f/u, if still in afib consider DCCV after 4 weeks of anticoag. From review of the 2014 afib guidelines a repeat TEE at 4 weeks "is an option", and residual thrombus would mean witholding cardioversion. Would recommend a repeat TEE prior to considering DCCV.   2. HTN - presented with severe HTN. At goal today.    3. CAD - prior PCI to distal RCA in 2006.   4. Recent CVA -  given cerebral atherosclersos neuro has recommended ASA along with eliquis.     [facility] will sign off.   Medication Recommendations:  Would change lopressor to 50mg  bid. Continue eliquis.  Other recommendations (labs, testing, etc):  At f/u if still in afib consider cardioversion Follow up as an outpatient:  We will arrange in 2-3 weeks.       For questions or updates, please contact [facility] Please consult [email or web address] for contact info under Cardiology/STEMI.      Joanie Coddington, MD  07/04/2018, 11:08 AM

## 2018-07-04 NOTE — Plan of Care (Signed)
Pt admitted for tachycardia and found to have afib RVR and LA thrombus. Currently on eliquis 5mg  bid.   He had likely left brain infarct in 04/2017 and stroke work up showed significant extra- and intracranial cerebral atherosclerosis, involving b/l ICA bifurcation, L ICA siphon, R A2, R V4, L V3, R V1 and mid R subclavian. He also had left M3 occlusion which was thought to be responsible for his event at that time. He was put on DAPT for 3 weeks and then plavix alone. However, as per Dr. Waymon AmatoHongalgi, pt did not continue plavix after 3 months of DAPT.   Given the severe cerebral atherosclerosis, I would recommend ASA 81mg  on top of eliquis 5mg  bid for stroke prevention. [facility]. We will make appointment for him.   Marvel PlanJindong Mycal Conde, MD PhD Stroke Neurology 07/04/2018 10:14 AM

## 2018-07-04 NOTE — Evaluation (Signed)
Physical Therapy Evaluation Patient Details Name: [name] MRN: [id number] DOB: Mar 26, 1941 Today's Date: 07/04/2018   History of Present Illness  RAIGAN BARIA is a 77 y.o. female with medical history significant of hypertension, hyperlipidemia, diabetes mellitus, stroke with aphasia June 2019, CAD, stent placement, hypersomnia, narcotic addiction, obesity, CHF, anxiety, who presents with irregular heartbeat and tachycardia. Now s/p TEE with cardioversion.      Clinical Impression  Pt admitted with above diagnosis. Pt currently with functional limitations due to the deficits listed below (see PT Problem List). PTA, pt living with husband at home receiving HHPT and Speech. Pt household ambulatory with intermittent use of RW or SPC, and hx of falls. Pt recent CVA with expressive aphasia. Patients husband reports that he is now supervising/guarding her with all mobility but feels capable at home with current level of assistance. Today pt ambulated 80 with min guard, poor obstacle avoidence with RW, HRmax 130, SpO2 WNL on RA.  Pt will benefit from skilled PT to increase their independence and safety with mobility to allow discharge to the venue listed below.       Follow Up Recommendations Home health PT;Supervision/Assistance - 24 hour    Equipment Recommendations  None recommended by PT    Recommendations for Other Services OT consult     Precautions / Restrictions Precautions Precautions: Fall Restrictions Weight Bearing Restrictions: No      Mobility  Bed Mobility Overal bed mobility: Modified Independent                Transfers Overall transfer level: Needs assistance Equipment used: Rolling walker (2 wheeled);None Transfers: Sit to/from Stand Sit to Stand: Supervision            Ambulation/Gait Ambulation/Gait assistance: Min guard Gait Distance (Feet): 80 Feet Assistive device: Rolling walker (2 wheeled) Gait Pattern/deviations: Step-to pattern Gait  velocity: decreased   General Gait Details: pt with short step legnth slow, mild unsteadiness. HR max 130 quickly from 95  Stairs            Wheelchair Mobility    Modified Rankin (Stroke Patients Only)       Balance Overall balance assessment: Needs assistance   Sitting balance-Leahy Scale: Good       Standing balance-Leahy Scale: Poor                               Pertinent Vitals/Pain Pain Assessment: No/denies pain    Home Living Family/patient expects to be discharged to:: Private residence Living Arrangements: Spouse/significant other Available Help at Discharge: Family;Available 24 hours/day Type of Home: House Home Access: Level entry     Home Layout: One level Home Equipment: Walker - 4 wheels;Cane - single point;Wheelchair - Manufacturing systems engineer      Prior Function           Comments: per husband, she is essentially homebound except for MD visits     Hand Dominance   Dominant Hand: Right    Extremity/Trunk Assessment   Upper Extremity Assessment Upper Extremity Assessment: Defer to OT evaluation    Lower Extremity Assessment Lower Extremity Assessment: (BLE gross 4-/5)       Communication   Communication: Expressive difficulties(expressive aphasia)  Cognition Arousal/Alertness: Awake/alert  General Comments General comments (skin integrity, edema, etc.): Extensive telephone call with husband on level of assistance and support at home    Exercises     Assessment/Plan    PT Assessment Patient needs continued PT services  PT Problem List Decreased strength;Decreased range of motion;Decreased activity tolerance;Decreased balance;Decreased mobility;Decreased coordination;Decreased cognition;Decreased safety awareness;Cardiopulmonary status limiting activity       PT Treatment Interventions DME instruction;Gait training;Stair training;Functional mobility  training;Therapeutic activities;Therapeutic exercise;Balance training    PT Goals (Current goals can be found in the Care Plan section)  Acute Rehab PT Goals Patient Stated Goal: return home PT Goal Formulation: With patient Time For Goal Achievement: 07/11/18 Potential to Achieve Goals: Good    Frequency Min 3X/week   Barriers to discharge        Co-evaluation               AM-PAC PT "6 Clicks" Daily Activity  Outcome Measure Difficulty turning over in bed (including adjusting bedclothes, sheets and blankets)?: None Difficulty moving from lying on back to sitting on the side of the bed? : None Difficulty sitting down on and standing up from a chair with arms (e.g., wheelchair, bedside commode, etc,.)?: A Little Help needed moving to and from a bed to chair (including a wheelchair)?: A Little Help needed walking in hospital room?: A Little Help needed climbing 3-5 steps with a railing? : A Little 6 Click Score: 20    End of Session Equipment Utilized During Treatment: Gait belt Activity Tolerance: Patient tolerated treatment well Patient left: in chair;with call bell/phone within reach;with nursing/sitter in room Nurse Communication: Mobility status PT Visit Diagnosis: Unsteadiness on feet (R26.81);Other abnormalities of gait and mobility (R26.89);Muscle weakness (generalized) (M62.81)    Time: [id number] PT Time Calculation (min) (ACUTE ONLY): 45 min   Charges:   PT Evaluation $PT Eval Moderate Complexity: 1 Mod PT Treatments $Gait Training: 8-22 mins $Self Care/Home Management: 8-22       Etta GrandchildSean Jamicheal Heard, PT, DPT Acute Rehab Services Pager: [phone]    Etta GrandchildSean Terrilynn Postell 07/04/2018, 10:05 AM

## 2018-07-04 NOTE — Progress Notes (Signed)
   07/04/18 1900  Clinical Encounter Type  Visited With Patient;Patient and family together  Visit Type Initial  Referral From Nurse  Consult/Referral To Chaplain  Spiritual Encounters  Spiritual Needs Prayer;Emotional  Stress Factors  Patient Stress Factors Exhausted    This was a page for a 77 yr old caucasian lady requesting for a bible. Family was on-site when I arrived to give the bible. Pt was very receptive and appreciative. Chaplain provided compassionate presence,  Ishmael Berkovich a [place], [address]

## 2018-07-05 LAB — GLUCOSE, CAPILLARY
GLUCOSE-CAPILLARY: 158 mg/dL — AB (ref 70–99)
Glucose-Capillary: 249 mg/dL — ABNORMAL HIGH (ref 70–99)

## 2018-07-05 MED ORDER — ASPIRIN 81 MG PO TBEC: 81.0000 mg | DELAYED_RELEASE_TABLET | Freq: Every day | ORAL | 0 refills | Status: AC

## 2018-07-05 MED ORDER — APIXABAN 5 MG PO TABS: 5.0000 mg | ORAL_TABLET | Freq: Two times a day (BID) | ORAL | 0 refills | Status: DC

## 2018-07-05 MED ORDER — METOPROLOL TARTRATE 50 MG PO TABS: 50.0000 mg | ORAL_TABLET | Freq: Two times a day (BID) | ORAL | 0 refills | Status: DC

## 2018-07-05 NOTE — Care Management Note (Signed)
Case Management Note  Patient Details  Name: [name] MRN: [id number] Date of Birth: July 09, 1941  Subjective/Objective:                    Action/Plan: Pt discharging home with resumption of [facility] services. CM met with the patient and her spouse and they would like to continue with [facility] and the same people that were providing therapy. CM called and notified [place] with [facility].  Pt d/c ing on Eliquis. CM provided them a 30 day free card and asked that they have pharmacy run it to see the monthly cost. If it is too expensive they will call the cardiology office for assistance.  Spouse to provide transport home.   Expected Discharge Date:  07/05/18               Expected Discharge Plan:  [place]  In-House Referral:     Discharge planning Services  CM Consult  Post Acute Care Choice:  Home Health Choice offered to:  Patient, Spouse  DME Arranged:    DME Agency:     HH Arranged:  PT, Speech Therapy HH Agency:  [place]  Status of Service:  Completed, signed off  If discussed at [place] of Stay Meetings, dates discussed:    Additional Comments:  Pollie Friar, RN 07/05/2018, 12:49 PM

## 2018-07-05 NOTE — Discharge Summary (Signed)
Physician Discharge Summary  [name] PPI:[id number] DOB: Kathleen 09, 1942  PCP: Dorise Hiss, PA-C  Admit date: 07/02/2018 Discharge date: 07/05/2018  Recommendations for Outpatient Follow-up:  1. [place], PA-C/PCP in 5 days. 2. Dr. Sanda Klein, Cardiology: Office will arrange follow-up. 3. [facility] in 2 weeks.  Epic message sent to office to coordinate.  Home Health: PT Equipment/Devices: None  Discharge Condition: Improved and stable CODE STATUS: Full Diet recommendation: Heart healthy & diabetic diet.  Discharge Diagnoses:  Principal Problem:   Atrial fibrillation with RVR ([place]) Active Problems:   CAD (coronary artery disease) status post stent to distal right coronary artery 2006   Essential hypertension   Hyperlipidemia   Anxiety   Hypersomnia, persistent   Type 2 diabetes mellitus without complication, with long-term current use of insulin (HCC)   Aphasia   Stroke (cerebrum) (HCC)   Hypothyroidism   Chronic diastolic CHF (congestive heart failure) (HCC)   New onset a-fib [facility])   Brief Summary: 77 year old married female with PMH of CAD status post stent, DM 2, HTN, HLD, obesity, chronic pain with narcotic dependence, anxiety, depression, right-sided Bell's palsy, recent hospitalization 6/25-6/26 for suspected left brain infarct versus TIA with residual dysarthria and expressive aphasia presented to the ED after home health nurse found her to have elevated BP and irregular tachycardia and noted to have A. fib with RVR.  She recently had self-limiting nausea, vomiting and diarrhea.  Cardiology consulted.  Initiated on metoprolol for rate control and Eliquis anticoagulation.  DCCV attempt on 8/9 aborted because TEE showed small mobile thrombus in the left atrial appendage.   Assessment & Plan:   New onset A. fib with RVR: Cardiology consultation and follow-up appreciated.  Atenolol discontinued.  Metoprolol  initiated and changed to 50 mg twice daily.  Eliquis started.  TSH, free T4 and free T3 normal.  A. fib now with controlled ventricular rate.  DCCV attempt on 8/9 aborted because TEE showed small mobile thrombus in the left atrial appendage.  Given cerebral atherosclerosis, Neurology recommended aspirin 81 mg daily along with Eliquis. CHADS2Vasc score is 7.  Case management assisted with discount card for Eliquis.  As per cardiology, reevaluated outpatient follow-up and if still in A. fib Kathleen consider repeat TEE prior to considering DCCV after 4 weeks of anticoagulation.  Hypertensive urgency: Presented with blood pressure of 218/102.  Continue HCTZ 25 mg daily, lisinopril at prior home dose 20 mg in a.m. and 10 mg in p.m. and metoprolol (atenolol discontinued).    Much improved control.  CAD status post PCI to distal RCA 2006: No chest pain.  Troponin cycled and negative.  Continue metoprolol and statin.  As confirmed with patient, remote stent placement.  As discussed with neurology on 8/10, changed Plavix to aspirin 81 mg daily due to significant extra and intracranial cerebral atherosclerosis and suspected recent stroke.  Hyperlipidemia: Continue Lipitor.  Anxiety: Continue home dose of Valium.  Hypersomnia: As per pharmacy verification, patient apparently was not taking Adderall PTA and was discontinued.  DM2 with vascular complications: Last A6T 9.3 on 05/19/2018.  Patient was initially placed on reduced dose of Lantus and SSI.    CBGs now continuing to rise as noted below.  Resume prior home dose of Lantus and close outpatient follow-up with PCP to determine addition of short-acting insulin or other medications as deemed necessary for better control.  Recent CVA with dysarthria and expressive aphasia: Continue atorvastatin.  Patient's recent CVA was possibly related to  A. fib.  Now started on Eliquis.  As per patient, she stopped taking Plavix after her prescription from recent discharge  ran out and did not know that she was supposed to continue it.  I discussed with Neurologist on 8/10 who recommended changing Plavix to aspirin 81 mg daily along with Eliquis.  Aspirin recommended due to extra and intracranial significant atherosclerosis.  Hypothyroid: Full TFTs normal.  Continue Synthroid.  Chronic diastolic CHF: TTE 7/61: LVEF 60 to 65% and grade 2 diastolic dysfunction.  Compensated.  TTE 8/9: LVEF 55-60%, mild AR, MR and TR.  Small mobile thrombus noted in the left atrial appendage.  Possible avascular necrosis in the superior right humeral head: Noted on chest x-ray.  Outpatient follow-up as deemed necessary.  Chronic pain: Controlled.  Outpatient follow-up with pain MD.    Consultants:  Cardiology  Procedures:  TEE 07/03/2018   Discharge Instructions  Discharge Instructions    Ambulatory referral to Neurology   Complete by:  As directed    Follow up with stroke clinic NP (Jessica Vanschaick or Cecille Rubin, if both not available, consider Zachery Dauer, or [place]) at [facility] in about 2 weeks. Thanks.   Call MD for:   Complete by:  As directed    Strokelike symptoms or palpitations.   Call MD for:  difficulty breathing, headache or visual disturbances   Complete by:  As directed    Call MD for:  extreme fatigue   Complete by:  As directed    Call MD for:  persistant dizziness or light-headedness   Complete by:  As directed    Call MD for:  severe uncontrolled pain   Complete by:  As directed    Diet - low sodium heart healthy   Complete by:  As directed    Diet Carb Modified   Complete by:  As directed    Increase activity slowly   Complete by:  As directed        Medication List    STOP taking these medications   amphetamine-dextroamphetamine 20 MG tablet Commonly known as:  ADDERALL   atenolol 25 MG tablet Commonly known as:  TENORMIN   clopidogrel 75 MG tablet Commonly known as:  PLAVIX     TAKE these medications   ACCU-CHEK  SOFTCLIX LANCETS lancets USE TO TEST FOUR TIMES DAILY   Alfalfa 250 MG Tabs Take 1 tablet by mouth every morning.   apixaban 5 MG Tabs tablet Commonly known as:  ELIQUIS Take 1 tablet (5 mg total) by mouth 2 (two) times daily.   aspirin 81 MG EC tablet Take 1 tablet (81 mg total) by mouth daily. Start taking on:  07/06/2018   atorvastatin 40 MG tablet Commonly known as:  LIPITOR Take 1 tablet (40 mg total) by mouth daily at 6 PM.   blood glucose meter kit and supplies Dispense based on patient and insurance preference. Use up to four times daily as directed. (FOR ICD-9 250.00, 250.01).   CHROMIUM ASPARTATE PO Take 1 tablet by mouth every morning.   Cinnamon Bark Powd Take 1 Dose by mouth daily.   diazepam 5 MG tablet Commonly known as:  VALIUM TAKE 1 TABLET BY MOUTH EVERY 12 HOURS AS NEEDED FOR ANXIETY   erythromycin ophthalmic ointment Place 1 application into the right eye 2 (two) times daily.   fexofenadine 180 MG tablet Commonly known as:  ALLEGRA Take 180 mg by mouth daily as needed for allergies.   fluorometholone 0.1 % ophthalmic suspension Commonly  known as:  FML Place 1 drop into the right eye 4 (four) times daily.   glucose blood test strip USE TO TEST FOUR TIMES DAILY   GNP COQ-10 & FISH OIL 120-180-50-30 Caps Generic drug:  DHA-EPA-Coenzyme Q10-Vitamin E Take 3 tablets by mouth daily.   HUGO ROLLING WALKER ELITE Misc 1 Units by Does not apply route daily as needed.   hydrochlorothiazide 25 MG tablet Commonly known as:  HYDRODIURIL Take 25 mg by mouth daily.   HYDROcodone-acetaminophen 10-325 MG tablet Commonly known as:  NORCO Take 1-2 tablets by mouth every 4 (four) hours as needed for moderate pain.   Insulin Glargine 100 UNIT/ML Solostar Pen Commonly known as:  LANTUS Inject 24 Units into the skin daily at 10 pm.   INSULIN SYRINGE .5CC/31GX5/16" 31G X 5/16" 0.5 ML Misc Use as directed   levothyroxine 50 MCG tablet Commonly known as:   SYNTHROID, LEVOTHROID Take 1 tablet (50 mcg total) by mouth daily before breakfast.   lisinopril 20 MG tablet Commonly known as:  PRINIVIL,ZESTRIL TAKE 1 TABLET BY MOUTH IN THE MORNING AND 1/2 (ONE-HALF) AT BEDTIME PLEASE SCHEDULE AN APPT FOR FUTURE REFILLS What changed:    how much to take  how to take this  when to take this   magnesium oxide 400 MG tablet Commonly known as:  MAG-OX Take 400 mg by mouth 2 (two) times daily.   metoprolol tartrate 50 MG tablet Commonly known as:  LOPRESSOR Take 1 tablet (50 mg total) by mouth 2 (two) times daily.   potassium chloride 10 MEQ tablet Commonly known as:  K-DUR Take 10 mEq by mouth daily.   venlafaxine 100 MG tablet Commonly known as:  EFFEXOR TAKE 1 TABLET BY MOUTH ONCE DAILY   Vitamin D3 5000 units Tbdp Take 1 capsule by mouth daily.      Follow-up Information    McVey, Gelene Mink, PA-C. Schedule an appointment as soon as possible for a visit in 5 day(s).   Specialties:  Librarian, academic, Family Medicine Contact information: [place] [address] [facility]. Schedule an appointment as soon as possible for a visit in 2 week(s).   Specialty:  Radiology Contact information: [address] [place] [phone]       Croitoru, Dani Gobble, MD Follow up.   Specialty:  Cardiology Why:  Our office will call you for a follow-up appointment. Please call the office if you have not heard from [place] within 3 days. Contact information: [address] [place] [address] [phone]          Allergies  Allergen Reactions  . Azithromycin Nausea And Vomiting  . Cardizem [Diltiazem Hcl] Nausea And Vomiting  . Codeine Other (See Comments)    Feeling weird   . Coreg [Carvedilol] Nausea And Vomiting  . Demerol [Meperidine] Other (See Comments)    Per pt disoriented  . Erythromycin Nausea And  Vomiting    Very sick  . Inderal [Propranolol] Other (See Comments)    Made me cry  . Procardia [Nifedipine] Other (See Comments)    Disoriented, sick  . Wellbutrin [Bupropion] Other (See Comments)    Unknown reaction  . Zoloft [Sertraline Hcl] Other (See Comments)    Could not talk, stiff      Procedures/Studies: Dg Chest 2 View  Result Date: 07/02/2018 CLINICAL DATA:  Hypertension and atrial fibrillation EXAM: CHEST - 2 VIEW COMPARISON:  January 09, 2018 FINDINGS: No edema or consolidation. The heart size and pulmonary vascularity are normal. No adenopathy. There is aortic atherosclerosis. There is degenerative change in each shoulder with suspected avascular necrosis in the superior right humeral head region. IMPRESSION: No edema or consolidation. Stable cardiac silhouette. There is aortic atherosclerosis. Suspect a degree of avascular necrosis in the superior right humeral head. Aortic Atherosclerosis (ICD10-I70.0). Electronically Signed   By: Lowella Grip III M.D.   On: 07/02/2018 17:44      Subjective: Patient seen this morning while she was working with PT.  Ambulating with PT with assistance.  No complaints reported.  Denies chest pain, dyspnea, palpitations, dizziness or lightheadedness.  As per RN, no acute issues noted.  Discharge Exam:  Vitals:   07/05/18 0340 07/05/18 0448 07/05/18 0737 07/05/18 1058  BP: (!) 126/52  (!) 165/60 129/76  Pulse: 87  95 96  Resp: (!) 22  18 (!) 24  Temp: (!) 97.5 F (36.4 C)  97.7 F (36.5 C) 98.1 F (36.7 C)  TempSrc: Oral  Oral Oral  SpO2: 98%     Weight:  81.3 kg    Height:        General exam: Pleasant elderly female, moderately built and nourished ambulating with assistance of PT.  Does not appear in any distress. Respiratory system: Clear to auscultation. Respiratory effort normal.   Cardiovascular system: S1 & S2 heard, irregularly irregular. No JVD, murmurs, rubs, gallops or clicks. No pedal edema.  Telemetry  personally reviewed: Controlled ventricular rate in the 90s. Gastrointestinal system: Abdomen is nondistended, soft and nontender. No organomegaly or masses felt. Normal bowel sounds heard.   Central nervous system: Alert and oriented x3.  Mild dysarthria +.  Expressive aphasia +.  Facial asymmetry present?  Related to prior right Bell's palsy, right sided facial weakness.  No pronator drift. Extremities: Symmetric 5 x 5 power. Skin: No rashes, lesions or ulcers Psychiatry: Judgement and insight appear normal. Mood & affect appropriate.     The results of significant diagnostics from this hospitalization (including imaging, microbiology, ancillary and laboratory) are listed below for reference.     Microbiology: Recent Results (from the past 240 hour(s))  Urine Culture     Status: None   Collection Time: 07/02/18 10:08 PM  Result Value Ref Range Status   Specimen Description URINE, RANDOM  Final   Special Requests NONE  Final   Culture   Final    NO GROWTH Performed at [facility], [address] [place], [place] [address]    Report Status 07/04/2018 FINAL  Final  MRSA PCR Screening     Status: None   Collection Time: 07/03/18  5:59 AM  Result Value Ref Range Status   MRSA by PCR NEGATIVE NEGATIVE Final    Comment:        The GeneXpert MRSA Assay (FDA approved for NASAL specimens only), is one component of a comprehensive MRSA colonization surveillance program. It is not intended to diagnose MRSA infection nor to guide or monitor treatment for MRSA infections. Performed at [facility], [place] [address] [place], [place] [address]      Labs: CBC: Recent Labs  Lab 07/02/18 1654 07/03/18 0451  WBC 9.9 9.8  HGB 12.6 12.8  HCT 39.8 40.0  MCV 86.1 85.8  PLT 243 030   Basic Metabolic Panel: Recent Labs  Lab 07/02/18 1700 07/03/18 0451  NA 134* 140  K 3.5 3.6  CL 102 106  CO2 19* 20*  GLUCOSE 327* 172*  BUN 23 16  CREATININE 1.04* 0.76  CALCIUM  9.3 9.6   Liver Function Tests: Recent Labs  Lab 07/02/18 1700  AST 27  ALT 20  ALKPHOS 94  BILITOT 0.5  PROT 6.2*  ALBUMIN 3.5   BNP (last 3 results) Recent Labs    07/02/18 2346  BNP 70.7   Cardiac Enzymes: Recent Labs  Lab 07/02/18 1929 07/02/18 2346 07/03/18 0451 07/03/18 0934  TROPONINI <0.03 <0.03 <0.03 <0.03   CBG: Recent Labs  Lab 07/04/18 1219 07/04/18 1702 07/04/18 2114 07/05/18 0745 07/05/18 1219  GLUCAP 178* 280* 300* 158* 249*   Thyroid function studies Recent Labs    07/02/18 2346 07/03/18 0452  TSH  --  1.309  T3FREE 2.9  --    Urinalysis    Component Value Date/Time   COLORURINE YELLOW 07/02/2018 1852   APPEARANCEUR CLEAR 07/02/2018 1852   LABSPEC 1.017 07/02/2018 1852   PHURINE 5.0 07/02/2018 1852   GLUCOSEU 150 (A) 07/02/2018 1852   HGBUR SMALL (A) 07/02/2018 1852   BILIRUBINUR NEGATIVE 07/02/2018 1852   KETONESUR NEGATIVE 07/02/2018 1852   PROTEINUR NEGATIVE 07/02/2018 1852   UROBILINOGEN 0.2 07/15/2013 1819   NITRITE NEGATIVE 07/02/2018 1852   LEUKOCYTESUR MODERATE (A) 07/02/2018 1852      Time coordinating discharge: 40 minutes  SIGNED:  Vernell Leep, MD, FACP, [facility]. [facility] Pager [phone]  If 7PM-7AM, please contact night-coverage [email or web address] Password [facility] 07/05/2018, 12:54 PM

## 2018-07-05 NOTE — Plan of Care (Signed)
Continue with plan of care.  

## 2018-07-05 NOTE — Progress Notes (Signed)
Physical Therapy Treatment Patient Details Name: [name] MRN: [id number] DOB: Jun 21, 1941 Today's Date: 07/05/2018    History of Present Illness [name] is a 77 y.o. female with medical history significant of hypertension, hyperlipidemia, diabetes mellitus, stroke with aphasia June 2019, CAD, stent placement, hypersomnia, narcotic addiction, obesity, CHF, anxiety, who presents with irregular heartbeat and tachycardia. Now s/p TEE with cardioversion.      PT Comments    Patient seen right after OT and visit with MD today, nursing in and out as well. Feel patient was limited this visit due to overwhelming environment and cognitive stimulation. Was unsuccessful on practice stair today, however pt reporting her step into home is much smaller than ours here today. I will plan to see patient again this afternoon when husband arrives to re-attempt and confirm his ability to safely guard her at home environment.      Follow Up Recommendations  Home health PT;Supervision/Assistance - 24 hour     Equipment Recommendations  None recommended by PT    Recommendations for Other Services OT consult     Precautions / Restrictions Precautions Precautions: Fall Restrictions Weight Bearing Restrictions: No    Mobility  Bed Mobility Overal bed mobility: Modified Independent                Transfers Overall transfer level: Needs assistance Equipment used: Rolling walker (2 wheeled);None Transfers: Sit to/from Stand Sit to Stand: Supervision;Min guard         General transfer comment: supervision for safety from EOB. Mi nGuard A from toilet  Ambulation/Gait Ambulation/Gait assistance: Min guard   Assistive device: Rolling walker (2 wheeled) Gait Pattern/deviations: Step-to pattern Gait velocity: decreased   General Gait Details: pt ambulating in and out of room to practice stair setupout side, clearly overhwlemed cogntivily with poorer safety today. reports she  needs to sit down    Stairs Stairs: Yes Stairs assistance: Min assist Stair Management: No rails Number of Stairs: 1 General stair comments: hand held assist and mod A for stability, pt unable to perform stair. reports her stair into home is much smaller and feel she will do okay with this at home, i will practice stair 1 more time today with her once family arrives to assess husbands ability to safely assist patient into home.   Wheelchair Mobility    Modified Rankin (Stroke Patients Only)       Balance Overall balance assessment: Needs assistance Sitting-balance support: No upper extremity supported;Feet supported Sitting balance-Leahy Scale: Good     Standing balance support: No upper extremity supported;During functional activity Standing balance-Leahy Scale: Fair Standing balance comment: Able to maintain static standing without UE support                            Cognition Arousal/Alertness: Awake/alert Behavior During Therapy: WFL for tasks assessed/performed Overall Cognitive Status: Impaired/Different from baseline Area of Impairment: Attention;Following commands;Safety/judgement;Awareness;Problem solving                   Current Attention Level: Sustained   Following Commands: Follows one step commands with increased time Safety/Judgement: Decreased awareness of safety Awareness: Emergent Problem Solving: Slow processing General Comments: Pt with baseline of CVA in June. Pt presenting with decreased attention, awareness, and problem solving which feel is baseline since CVA.       Exercises      General Comments        Pertinent Vitals/Pain Pain  Assessment: No/denies pain    Home Living Family/patient expects to be discharged to:: Private residence Living Arrangements: Spouse/significant other Available Help at Discharge: Family;Available 24 hours/day Type of Home: House Home Access: Level entry   Home Layout: One level Home  Equipment: Walker - 4 wheels;Cane - single point;Wheelchair - manual;Shower seat Additional Comments: Pt has entrance in back that only has 1 step and she uses this entrance most of the time.    Prior Function Level of Independence: Needs assistance  Gait / Transfers Assistance Needed: Uses rollator ADL's / Homemaking Assistance Needed: Husband helps with bathing and dressing.  Comments: Per chart review   PT Goals (current goals can now be found in the care plan section) Acute Rehab PT Goals Patient Stated Goal: return home PT Goal Formulation: With patient Time For Goal Achievement: 07/11/18 Potential to Achieve Goals: Good Progress towards PT goals: Progressing toward goals    Frequency    Min 3X/week      PT Plan Current plan remains appropriate    Co-evaluation              AM-PAC PT "6 Clicks" Daily Activity  Outcome Measure  Difficulty turning over in bed (including adjusting bedclothes, sheets and blankets)?: None Difficulty moving from lying on back to sitting on the side of the bed? : None Difficulty sitting down on and standing up from a chair with arms (e.g., wheelchair, bedside commode, etc,.)?: A Little Help needed moving to and from a bed to chair (including a wheelchair)?: A Little Help needed walking in hospital room?: A Little Help needed climbing 3-5 steps with a railing? : A Little 6 Click Score: 20    End of Session Equipment Utilized During Treatment: Gait belt Activity Tolerance: Patient tolerated treatment well Patient left: in chair;with call bell/phone within reach;with nursing/sitter in room Nurse Communication: Mobility status PT Visit Diagnosis: Unsteadiness on feet (R26.81);Other abnormalities of gait and mobility (R26.89);Muscle weakness (generalized) (M62.81)     Time: 0950-1005 PT Time Calculation (min) (ACUTE ONLY): 15 min  Charges:  $Gait Training: 8-22 mins                     Etta GrandchildSean Elmor Kost, PT, DPT Acute Rehab  Services Pager: [phone]     Etta GrandchildSean Derinda Bartus 07/05/2018, 10:09 AM

## 2018-07-05 NOTE — Evaluation (Signed)
Occupational Therapy Evaluation Patient Details Name: [name] MRN: [id number] DOB: June 12, 1941 Today's Date: 07/05/2018    History of Present Illness [name] is a 77 y.o. female with medical history significant of hypertension, hyperlipidemia, diabetes mellitus, stroke with aphasia June 2019, CAD, stent placement, hypersomnia, narcotic addiction, obesity, CHF, anxiety, who presents with irregular heartbeat and tachycardia. Now s/p TEE with cardioversion.     Clinical Impression   PTA, pt was living with her husband and requires assistance for ADLs and IADLs. Pt requiring Min Guard A for LB ADLs, toileting, and functional mobility. Pt presenting with decreased cognition, balance, strength, and activity tolerance. Pt would benefit from further acute OT to facilitate safe dc. Recommend dc to home with HHOT for further OT to optimize safety, independence with ADLs, and return to PLOF.      Follow Up Recommendations  Home health OT;Supervision/Assistance - 24 hour    Equipment Recommendations  None recommended by OT    Recommendations for Other Services PT consult     Precautions / Restrictions Precautions Precautions: Fall Restrictions Weight Bearing Restrictions: No      Mobility Bed Mobility Overal bed mobility: Modified Independent                Transfers Overall transfer level: Needs assistance Equipment used: Rolling walker (2 wheeled);None Transfers: Sit to/from Stand Sit to Stand: Supervision;Min guard         General transfer comment: supervision for safety from EOB. Mi nGuard A from toilet    Balance Overall balance assessment: Needs assistance Sitting-balance support: No upper extremity supported;Feet supported Sitting balance-Leahy Scale: Good     Standing balance support: No upper extremity supported;During functional activity Standing balance-Leahy Scale: Fair Standing balance comment: Able to maintain static standing without UE  support                           ADL either performed or assessed with clinical judgement   ADL Overall ADL's : Needs assistance/impaired Eating/Feeding: Set up;Sitting   Grooming: Wash/dry hands;Standing;Min guard Grooming Details (indicate cue type and reason): Pt using hand sanitizer after toileting Upper Body Bathing: Set up;Supervision/ safety;Sitting   Lower Body Bathing: Min guard;Sit to/from stand   Upper Body Dressing : Set up;Supervision/safety;Sitting   Lower Body Dressing: Min guard;Sit to/from stand Lower Body Dressing Details (indicate cue type and reason): Min Guard A for safety in standing. Pt able to don socks at EOB with supervision and significant time for processing. Toilet Transfer: Technical sales engineer and Hygiene: Min guard;Sit to/from stand Toileting - Clothing Manipulation Details (indicate cue type and reason): Min Guard A for safety duirng toilet hygiene.     Functional mobility during ADLs: Min guard;Rolling walker General ADL Comments: Pt presenting with decreased strength, balance, and cognition. Pt requiring Min Guard A.     Vision         Perception     Praxis      Pertinent Vitals/Pain Pain Assessment: No/denies pain     Hand Dominance Right   Extremity/Trunk Assessment Upper Extremity Assessment Upper Extremity Assessment: Overall WFL for tasks assessed   Lower Extremity Assessment Lower Extremity Assessment: Defer to PT evaluation   Cervical / Trunk Assessment Cervical / Trunk Assessment: Normal   Communication Communication Communication: Expressive difficulties(expressive aphasia)   Cognition Arousal/Alertness: Awake/alert Behavior During Therapy: WFL for tasks assessed/performed Overall Cognitive Status: Impaired/Different from baseline Area of Impairment: Attention;Following commands;Safety/judgement;Awareness;Problem  solving                   Current  Attention Level: Sustained   Following Commands: Follows one step commands with increased time Safety/Judgement: Decreased awareness of safety Awareness: Emergent Problem Solving: Slow processing General Comments: Pt with baseline of CVA in June. Pt presenting with decreased attention, awareness, and problem solving which feel is baseline since CVA.    General Comments       Exercises     Shoulder Instructions      Home Living Family/patient expects to be discharged to:: Private residence Living Arrangements: Spouse/significant other Available Help at Discharge: Family;Available 24 hours/day Type of Home: House Home Access: Level entry     Home Layout: One level     Bathroom Shower/Tub: Tub/shower unit;Walk-in shower   Bathroom Toilet: Handicapped height     Home Equipment: Environmental consultantWalker - 4 wheels;Cane - single point;Wheelchair - manual;Shower seat   Additional Comments: Pt has entrance in back that only has 1 step and she uses this entrance most of the time.      Prior Functioning/Environment Level of Independence: Needs assistance  Gait / Transfers Assistance Needed: Uses rollator ADL's / Homemaking Assistance Needed: Husband helps with bathing and dressing.    Comments: Per chart review        OT Problem List: Decreased strength;Decreased range of motion;Decreased activity tolerance;Impaired balance (sitting and/or standing);Decreased cognition;Decreased safety awareness;Decreased knowledge of use of DME or AE;Decreased knowledge of precautions;Pain      OT Treatment/Interventions: Self-care/ADL training;Therapeutic exercise;Energy conservation;DME and/or AE instruction;Therapeutic activities;Patient/family education    OT Goals(Current goals can be found in the care plan section) Acute Rehab OT Goals Patient Stated Goal: return home OT Goal Formulation: With patient Time For Goal Achievement: 07/19/18 Potential to Achieve Goals: Good ADL Goals Pt Will Perform  Lower Body Dressing: with modified independence;sit to/from stand Pt Will Transfer to Toilet: with modified independence;ambulating;regular height toilet Pt Will Perform Toileting - Clothing Manipulation and hygiene: with modified independence;sit to/from stand Pt Will Perform Tub/Shower Transfer: Shower transfer;shower seat;with supervision;ambulating  OT Frequency: Min 2X/week   Barriers to D/C:            Co-evaluation              AM-PAC PT "6 Clicks" Daily Activity     Outcome Measure Help from another person eating meals?: None Help from another person taking care of personal grooming?: A Little Help from another person toileting, which includes using toliet, bedpan, or urinal?: A Little Help from another person bathing (including washing, rinsing, drying)?: A Little Help from another person to put on and taking off regular upper body clothing?: A Little Help from another person to put on and taking off regular lower body clothing?: A Little 6 Click Score: 19   End of Session Equipment Utilized During Treatment: Gait belt;Rolling walker Nurse Communication: Mobility status  Activity Tolerance: Patient tolerated treatment well Patient left: in bed;with call bell/phone within reach(with PT at EOB)  OT Visit Diagnosis: Unsteadiness on feet (R26.81);Other abnormalities of gait and mobility (R26.89);Muscle weakness (generalized) (M62.81);Pain Pain - Right/Left: (Bilateral) Pain - part of body: Knee                Time: [id number] OT Time Calculation (min): 21 min Charges:  OT General Charges $OT Visit: 1 Visit OT Evaluation $OT Eval Moderate Complexity: 1 Mod  Joury Allcorn MSOT, OTR/L Acute Rehab Pager: [phone] Office: [phone]  Theodoro GristCharis M Brihana Quickel 07/05/2018,  9:57 AM

## 2018-07-05 NOTE — Discharge Instructions (Addendum)
Information on my medicine - ELIQUIS (apixaban)  This medication education was reviewed with me or my healthcare representative as part of my discharge preparation.  The pharmacist that spoke with me during my hospital stay was:  Gardner Candle, [facility]  Why was Eliquis prescribed for you? Eliquis was prescribed for you to reduce the risk of forming blood clots that can cause a stroke if you have a medical condition called atrial fibrillation (a type of irregular heartbeat) OR to reduce the risk of a blood clots forming after orthopedic surgery.  What do You need to know about Eliquis ? Take your Eliquis TWICE DAILY - one tablet in the morning and one tablet in the evening with or without food.  It would be best to take the doses about the same time each day.  If you have difficulty swallowing the tablet whole please discuss with your pharmacist how to take the medication safely.  Take Eliquis exactly as prescribed by your doctor and DO NOT stop taking Eliquis without talking to the doctor who prescribed the medication.  Stopping may increase your risk of developing a new clot or stroke.  Refill your prescription before you run out.  After discharge, you should have regular check-up appointments with your healthcare provider that is prescribing your Eliquis.  In the future your dose may need to be changed if your kidney function or weight changes by a significant amount or as you get older.  What do you do if you miss a dose? If you miss a dose, take it as soon as you remember on the same day and resume taking twice daily.  Do not take more than one dose of ELIQUIS at the same time.  Important Safety Information A possible side effect of Eliquis is bleeding. You should call your healthcare provider right away if you experience any of the following: ? Bleeding from an injury or your nose that does not stop. ? Unusual colored urine (red or dark brown) or unusual colored stools (red or  black). ? Unusual bruising for unknown reasons. ? A serious fall or if you hit your head (even if there is no bleeding).  Some medicines may interact with Eliquis and might increase your risk of bleeding or clotting while on Eliquis. To help avoid this, consult your healthcare provider or pharmacist prior to using any new prescription or non-prescription medications, including herbals, vitamins, non-steroidal anti-inflammatory drugs (NSAIDs) and supplements.  This website has more information on Eliquis (apixaban): [email or web address].  =============================================================================================================  Please get your medications reviewed and adjusted by your Primary MD.  Please request your Primary MD to go over all Hospital Tests and Procedure/Radiological results at the follow up, please get all Hospital records sent to your Prim MD by signing hospital release before you go home.  If you had Pneumonia of Lung problems at the Hospital: Please get a 2 view Chest X ray done in 6-8 weeks after hospital discharge or sooner if instructed by your Primary MD.  If you have Congestive Heart Failure: Please call your Cardiologist or Primary MD anytime you have any of the following symptoms:  1) 3 pound weight gain in 24 hours or 5 pounds in 1 week  2) shortness of breath, with or without a dry hacking cough  3) swelling in the hands, feet or stomach  4) if you have to sleep on extra pillows at night in order to breathe  Follow cardiac low salt diet and 1.5 lit/day fluid restriction.  If you have diabetes Accuchecks 4 times/day, Once in AM empty stomach and then before each meal. Log in all results and show them to your primary doctor at your next visit. If any glucose reading is under 80 or above 300 call your primary MD immediately.  If you have Seizure/Convulsions/Epilepsy: Please do not drive, operate heavy machinery, participate in activities at  heights or participate in high speed sports until you have seen by Primary MD or a Neurologist and advised to do so again.  If you had Gastrointestinal Bleeding: Please ask your Primary MD to check a complete blood count within one week of discharge or at your next visit. Your endoscopic/colonoscopic biopsies that are pending at the time of discharge, will also need to followed by your Primary MD.  Get Medicines reviewed and adjusted. Please take all your medications with you for your next visit with your Primary MD  Please request your Primary MD to go over all hospital tests and procedure/radiological results at the follow up, please ask your Primary MD to get all Hospital records sent to his/her office.  If you experience worsening of your admission symptoms, develop shortness of breath, life threatening emergency, suicidal or homicidal thoughts you must seek medical attention immediately by calling 911 or calling your MD immediately  if symptoms less severe.  You must read complete instructions/literature along with all the possible adverse reactions/side effects for all the Medicines you take and that have been prescribed to you. Take any new Medicines after you have completely understood and accpet all the possible adverse reactions/side effects.   Do not drive or operate heavy machinery when taking Pain medications.   Do not take more than prescribed Pain, Sleep and Anxiety Medications  Special Instructions: If you have smoked or chewed Tobacco  in the last 2 yrs please stop smoking, stop any regular Alcohol  and or any Recreational drug use.  Wear Seat belts while driving.  Please note You were cared for by a hospitalist during your hospital stay. If you have any questions about your discharge medications or the care you received while you were in the hospital after you are discharged, you can call the unit and asked to speak with the hospitalist on call if the hospitalist that took  care of you is not available. Once you are discharged, your primary care physician will handle any further medical issues. Please note that NO REFILLS for any discharge medications will be authorized once you are discharged, as it is imperative that you return to your primary care physician (or establish a relationship with a primary care physician if you do not have one) for your aftercare needs so that they can reassess your need for medications and monitor your lab values.  You can reach the hospitalist office at phone [phone] or fax [phone]   If you do not have a primary care physician, you can call [phone] for a physician referral.  Apixaban oral tablets What is this medicine? APIXABAN (a PIX a ban) is an anticoagulant (blood thinner). It is used to lower the chance of stroke in people with a medical condition called atrial fibrillation. It is also used to treat or prevent blood clots in the lungs or in the veins. This medicine may be used for other purposes; ask your health care provider or pharmacist if you have questions. COMMON BRAND NAME(S): Eliquis What should I tell my health care provider before I take this medicine? They need to know if you have  any of these conditions: -bleeding disorders -bleeding in the brain -blood in your stools (black or tarry stools) or if you have blood in your vomit -history of stomach bleeding -kidney disease -liver disease -mechanical heart valve -an unusual or allergic reaction to apixaban, other medicines, foods, dyes, or preservatives -pregnant or trying to get pregnant -breast-feeding How should I use this medicine? Take this medicine by mouth with a glass of water. Follow the directions on the prescription label. You can take it with or without food. If it upsets your stomach, take it with food. Take your medicine at regular intervals. Do not take it more often than directed. Do not stop taking except on your doctor's advice. Stopping  this medicine may increase your risk of a blot clot. Be sure to refill your prescription before you run out of medicine. Talk to your pediatrician regarding the use of this medicine in children. Special care may be needed. Overdosage: If you think you have taken too much of this medicine contact a poison control center or emergency room at once. NOTE: This medicine is only for you. Do not share this medicine with others. What if I miss a dose? If you miss a dose, take it as soon as you can. If it is almost time for your next dose, take only that dose. Do not take double or extra doses. What may interact with this medicine? This medicine may interact with the following: -aspirin and aspirin-like medicines -certain medicines for fungal infections like ketoconazole and itraconazole -certain medicines for seizures like carbamazepine and phenytoin -certain medicines that treat or prevent blood clots like warfarin, enoxaparin, and dalteparin -clarithromycin -NSAIDs, medicines for pain and inflammation, like ibuprofen or naproxen -rifampin -ritonavir -St. John's wort This list may not describe all possible interactions. Give your health care provider a list of all the medicines, herbs, non-prescription drugs, or dietary supplements you use. Also tell them if you smoke, drink alcohol, or use illegal drugs. Some items may interact with your medicine. What should I watch for while using this medicine? Visit your doctor or health care professional for regular checks on your progress. Notify your doctor or health care professional and seek emergency treatment if you develop breathing problems; changes in vision; chest pain; severe, sudden headache; pain, swelling, warmth in the leg; trouble speaking; sudden numbness or weakness of the face, arm or leg. These can be signs that your condition has gotten worse. If you are going to have surgery or other procedure, tell your doctor that you are taking this  medicine. What side effects may I notice from receiving this medicine? Side effects that you should report to your doctor or health care professional as soon as possible: -allergic reactions like skin rash, itching or hives, swelling of the face, lips, or tongue -signs and symptoms of bleeding such as bloody or black, tarry stools; red or dark-brown urine; spitting up blood or brown material that looks like coffee grounds; red spots on the skin; unusual bruising or bleeding from the eye, gums, or nose This list may not describe all possible side effects. Call your doctor for medical advice about side effects. You may report side effects to FDA at 1-800-FDA-1088. Where should I keep my medicine? Keep out of the reach of children. Store at room temperature between 20 and 25 degrees C (68 and 77 degrees F). Throw away any unused medicine after the expiration date. NOTE: This sheet is a summary. It may not cover all possible information. If  you have questions about this medicine, talk to your doctor, pharmacist, or health care provider.  2018 Elsevier/Gold Standard (2016-06-03 11:54:23)

## 2018-07-05 NOTE — Progress Notes (Signed)
Physical Therapy Treatment Patient Details Name: [name] MRN: [id number] DOB: 07/22/1941 Today's Date: 07/05/2018    History of Present Illness [name] is a 77 y.o. female with medical history significant of hypertension, hyperlipidemia, diabetes mellitus, stroke with aphasia June 2019, CAD, stent placement, hypersomnia, narcotic addiction, obesity, CHF, anxiety, who presents with irregular heartbeat and tachycardia. Now s/p TEE with cardioversion.      PT Comments    Revisted with patient and husband. Patient able to do much more now that environment was less busy. Ambulated 20' and performed stair training. Educated husband on proper use of gait belt and guarding at home to ensure safety. Pt and husband report confidence, discussed HHPT role at home. Patient SpO2 WNL on RA, HR ranges 95-189max this visit with activity.      Follow Up Recommendations  Home health PT;Supervision/Assistance - 24 hour     Equipment Recommendations  None recommended by PT    Recommendations for Other Services OT consult     Precautions / Restrictions Precautions Precautions: Fall Restrictions Weight Bearing Restrictions: No    Mobility  Bed Mobility Overal bed mobility: Modified Independent                Transfers Overall transfer level: Needs assistance Equipment used: Rolling walker (2 wheeled);None Transfers: Sit to/from Stand Sit to Stand: Supervision;Min guard         General transfer comment: supervision for safety from EOB. Mi nGuard A from toilet  Ambulation/Gait Ambulation/Gait assistance: Min guard Gait Distance (Feet): 80 Feet Assistive device: Rolling walker (2 wheeled) Gait Pattern/deviations: Step-to pattern Gait velocity: decreased   General Gait Details: 62' no LOB, HRmax 130, SpO2 WNL. educated husband on use of gait belt   Stairs Stairs: Yes Stairs assistance: Min assist Stair Management: No rails Number of Stairs: 2 General stair  comments: pt able to perform stair today with anxiety, min A to stabilize    Wheelchair Mobility    Modified Rankin (Stroke Patients Only)       Balance Overall balance assessment: Needs assistance Sitting-balance support: No upper extremity supported;Feet supported Sitting balance-Leahy Scale: Good     Standing balance support: No upper extremity supported;During functional activity Standing balance-Leahy Scale: Fair Standing balance comment: Able to maintain static standing without UE support                            Cognition Arousal/Alertness: Awake/alert Behavior During Therapy: WFL for tasks assessed/performed Overall Cognitive Status: Impaired/Different from baseline Area of Impairment: Attention;Following commands;Safety/judgement;Awareness;Problem solving                   Current Attention Level: Sustained   Following Commands: Follows one step commands with increased time Safety/Judgement: Decreased awareness of safety Awareness: Emergent Problem Solving: Slow processing General Comments: Pt with baseline of CVA in June. Pt presenting with decreased attention, awareness, and problem solving which feel is baseline since CVA.       Exercises      General Comments        Pertinent Vitals/Pain Pain Assessment: No/denies pain    Home Living Family/patient expects to be discharged to:: Private residence Living Arrangements: Spouse/significant other Available Help at Discharge: Family;Available 24 hours/day Type of Home: House Home Access: Level entry   Home Layout: One level Home Equipment: Walker - 4 wheels;Cane - single point;Wheelchair - manual;Shower seat Additional Comments: Pt has entrance in back that only has 1  step and she uses this entrance most of the time.    Prior Function Level of Independence: Needs assistance  Gait / Transfers Assistance Needed: Uses rollator ADL's / Homemaking Assistance Needed: Husband helps with  bathing and dressing.  Comments: Per chart review   PT Goals (current goals can now be found in the care plan section) Acute Rehab PT Goals Patient Stated Goal: return home PT Goal Formulation: With patient Time For Goal Achievement: 07/11/18 Potential to Achieve Goals: Good Progress towards PT goals: Progressing toward goals    Frequency    Min 3X/week      PT Plan Current plan remains appropriate    Co-evaluation              AM-PAC PT "6 Clicks" Daily Activity  Outcome Measure  Difficulty turning over in bed (including adjusting bedclothes, sheets and blankets)?: None Difficulty moving from lying on back to sitting on the side of the bed? : None Difficulty sitting down on and standing up from a chair with arms (e.g., wheelchair, bedside commode, etc,.)?: A Little Help needed moving to and from a bed to chair (including a wheelchair)?: A Little Help needed walking in hospital room?: A Little Help needed climbing 3-5 steps with a railing? : A Little 6 Click Score: 20    End of Session Equipment Utilized During Treatment: Gait belt Activity Tolerance: Patient tolerated treatment well Patient left: in chair;with call bell/phone within reach;with nursing/sitter in room Nurse Communication: Mobility status PT Visit Diagnosis: Unsteadiness on feet (R26.81);Other abnormalities of gait and mobility (R26.89);Muscle weakness (generalized) (M62.81)     Time: 1110-1130 PT Time Calculation (min) (ACUTE ONLY): 20 min  Charges:  $Gait Training: 8-22 mins                    Etta GrandchildSean Robertine Kipper, PT, DPT Acute Rehab Services Pager: [phone]     Etta GrandchildSean Elma Shands 07/05/2018, 11:40 AM

## 2018-07-06 NOTE — Anesthesia Postprocedure Evaluation (Signed)
Anesthesia Post Note  Patient: [name]  Procedure(s) Performed: TRANSESOPHAGEAL ECHOCARDIOGRAM (TEE) (N/A )     Patient location during evaluation: Endoscopy Anesthesia Type: MAC Level of consciousness: awake and alert Pain management: pain level controlled Vital Signs Assessment: post-procedure vital signs reviewed and stable Respiratory status: spontaneous breathing, nonlabored ventilation and respiratory function stable Cardiovascular status: stable and blood pressure returned to baseline Postop Assessment: no apparent nausea or vomiting Anesthetic complications: no    Last Vitals:  Vitals:   07/05/18 0737 07/05/18 1058  BP: (!) 165/60 129/76  Pulse: 95 96  Resp: 18 (!) 24  Temp: 36.5 C 36.7 C  SpO2:      Last Pain:  Vitals:   07/05/18 1200  TempSrc:   PainSc: 0-No pain                 Dakarri Kessinger,W. EDMOND

## 2018-07-07 ENCOUNTER — Telehealth: Payer: Self-pay | Admitting: Physician Assistant

## 2018-07-07 ENCOUNTER — Ambulatory Visit: Payer: Medicare Other | Admitting: Cardiology

## 2018-07-07 NOTE — Telephone Encounter (Signed)
Copied from CRM [phone]. Topic: General - Other >> Jul 07, 2018 12:58 PM Raoul PitchWilliams-Neal, Mamie NickSade R wrote: [facility] called and stated that patients husband came out today to greet her and was told you that she was unable to tolerate session today   Cb# [id number]

## 2018-07-09 NOTE — Telephone Encounter (Signed)
Erie NoeVanessa calling to get the ok to move the pts home session to Friday of this week due to pt being sick on Tuesday. CB#: [phone]

## 2018-07-10 DIAGNOSIS — E119 Type 2 diabetes mellitus without complications: Secondary | ICD-10-CM | POA: Diagnosis not present

## 2018-07-10 DIAGNOSIS — I69328 Other speech and language deficits following cerebral infarction: Secondary | ICD-10-CM | POA: Diagnosis not present

## 2018-07-10 NOTE — Telephone Encounter (Signed)
[facility]ed [facility] - approved visit for Friday .

## 2018-07-13 ENCOUNTER — Encounter (HOSPITAL_COMMUNITY): Payer: Self-pay | Admitting: *Deleted

## 2018-07-13 ENCOUNTER — Emergency Department (HOSPITAL_COMMUNITY)
Admission: EM | Admit: 2018-07-13 | Discharge: 2018-07-14 | Disposition: A | Discharge status: Home / Self Care | Payer: Medicare Other | Attending: Emergency Medicine | Admitting: Emergency Medicine

## 2018-07-13 ENCOUNTER — Emergency Department (HOSPITAL_COMMUNITY): Payer: Medicare Other

## 2018-07-13 DIAGNOSIS — Z79899 Other long term (current) drug therapy: Secondary | ICD-10-CM | POA: Diagnosis not present

## 2018-07-13 DIAGNOSIS — E039 Hypothyroidism, unspecified: Secondary | ICD-10-CM | POA: Diagnosis not present

## 2018-07-13 DIAGNOSIS — G9389 Other specified disorders of brain: Secondary | ICD-10-CM | POA: Diagnosis not present

## 2018-07-13 DIAGNOSIS — Z7901 Long term (current) use of anticoagulants: Secondary | ICD-10-CM | POA: Insufficient documentation

## 2018-07-13 DIAGNOSIS — Z8673 Personal history of transient ischemic attack (TIA), and cerebral infarction without residual deficits: Secondary | ICD-10-CM | POA: Insufficient documentation

## 2018-07-13 DIAGNOSIS — F419 Anxiety disorder, unspecified: Secondary | ICD-10-CM | POA: Insufficient documentation

## 2018-07-13 DIAGNOSIS — F329 Major depressive disorder, single episode, unspecified: Secondary | ICD-10-CM | POA: Diagnosis not present

## 2018-07-13 DIAGNOSIS — R0902 Hypoxemia: Secondary | ICD-10-CM | POA: Diagnosis not present

## 2018-07-13 DIAGNOSIS — Z794 Long term (current) use of insulin: Secondary | ICD-10-CM | POA: Diagnosis not present

## 2018-07-13 DIAGNOSIS — R064 Hyperventilation: Secondary | ICD-10-CM | POA: Diagnosis not present

## 2018-07-13 DIAGNOSIS — Z7982 Long term (current) use of aspirin: Secondary | ICD-10-CM | POA: Diagnosis not present

## 2018-07-13 DIAGNOSIS — R0689 Other abnormalities of breathing: Secondary | ICD-10-CM | POA: Diagnosis not present

## 2018-07-13 DIAGNOSIS — I6782 Cerebral ischemia: Secondary | ICD-10-CM | POA: Diagnosis not present

## 2018-07-13 DIAGNOSIS — R1111 Vomiting without nausea: Secondary | ICD-10-CM

## 2018-07-13 DIAGNOSIS — I5032 Chronic diastolic (congestive) heart failure: Secondary | ICD-10-CM | POA: Insufficient documentation

## 2018-07-13 DIAGNOSIS — E119 Type 2 diabetes mellitus without complications: Secondary | ICD-10-CM | POA: Diagnosis not present

## 2018-07-13 DIAGNOSIS — R112 Nausea with vomiting, unspecified: Secondary | ICD-10-CM | POA: Diagnosis not present

## 2018-07-13 DIAGNOSIS — I2510 Atherosclerotic heart disease of native coronary artery without angina pectoris: Secondary | ICD-10-CM | POA: Diagnosis not present

## 2018-07-13 DIAGNOSIS — R1110 Vomiting, unspecified: Secondary | ICD-10-CM | POA: Diagnosis not present

## 2018-07-13 DIAGNOSIS — E1165 Type 2 diabetes mellitus with hyperglycemia: Secondary | ICD-10-CM | POA: Diagnosis not present

## 2018-07-13 DIAGNOSIS — I110 Hypertensive heart disease with heart failure: Secondary | ICD-10-CM | POA: Diagnosis not present

## 2018-07-13 DIAGNOSIS — R000 Tachycardia, unspecified: Secondary | ICD-10-CM | POA: Diagnosis not present

## 2018-07-13 LAB — CBC
HCT: 41 % (ref 36.0–46.0)
HEMOGLOBIN: 12.9 g/dL (ref 12.0–15.0)
MCH: 27.2 pg (ref 26.0–34.0)
MCHC: 31.5 g/dL (ref 30.0–36.0)
MCV: 86.3 fL (ref 78.0–100.0)
Platelets: 285 10*3/uL (ref 150–400)
RBC: 4.75 MIL/uL (ref 3.87–5.11)
RDW: 13.2 % (ref 11.5–15.5)
WBC: 12.3 10*3/uL — AB (ref 4.0–10.5)

## 2018-07-13 LAB — URINALYSIS, ROUTINE W REFLEX MICROSCOPIC
Bilirubin Urine: NEGATIVE
GLUCOSE, UA: NEGATIVE mg/dL
KETONES UR: NEGATIVE mg/dL
NITRITE: NEGATIVE
PROTEIN: NEGATIVE mg/dL
Specific Gravity, Urine: 1.013 (ref 1.005–1.030)
pH: 5 (ref 5.0–8.0)

## 2018-07-13 LAB — COMPREHENSIVE METABOLIC PANEL
ALK PHOS: 107 U/L (ref 38–126)
ALT: 19 U/L (ref 0–44)
ANION GAP: 12 (ref 5–15)
AST: 22 U/L (ref 15–41)
Albumin: 3.5 g/dL (ref 3.5–5.0)
BILIRUBIN TOTAL: 0.8 mg/dL (ref 0.3–1.2)
BUN: 15 mg/dL (ref 8–23)
CALCIUM: 9.5 mg/dL (ref 8.9–10.3)
CO2: 21 mmol/L — ABNORMAL LOW (ref 22–32)
Chloride: 104 mmol/L (ref 98–111)
Creatinine, Ser: 0.88 mg/dL (ref 0.44–1.00)
GFR calc non Af Amer: >60 mL/min (ref >60)
Glucose, Bld: 259 mg/dL — ABNORMAL HIGH (ref 70–99)
Potassium: 4.4 mmol/L (ref 3.5–5.1)
SODIUM: 137 mmol/L (ref 135–145)
Total Protein: 6.9 g/dL (ref 6.5–8.1)

## 2018-07-13 LAB — LIPASE, BLOOD: Lipase: 40 U/L (ref 11–51)

## 2018-07-13 LAB — I-STAT TROPONIN, ED: Troponin i, poc: 0.04 ng/mL (ref 0.00–0.08)

## 2018-07-13 MED ORDER — INSULIN ASPART 100 UNIT/ML ~~LOC~~ SOLN
0.0000 [IU] | Freq: Three times a day (TID) | SUBCUTANEOUS | Status: DC
  Administered 2018-07-14 (×2): 5 [IU] via SUBCUTANEOUS
  Administered 2018-07-14: 3 [IU] via SUBCUTANEOUS
  Filled 2018-07-13 (×3): qty 1

## 2018-07-13 MED ORDER — METOPROLOL TARTRATE 25 MG PO TABS
50.0000 mg | ORAL_TABLET | Freq: Two times a day (BID) | ORAL | Status: DC
  Administered 2018-07-14 (×2): 50 mg via ORAL
  Filled 2018-07-13 (×2): qty 2

## 2018-07-13 MED ORDER — VENLAFAXINE HCL 50 MG PO TABS
100.0000 mg | ORAL_TABLET | Freq: Every day | ORAL | Status: DC
  Filled 2018-07-13 (×2): qty 2

## 2018-07-13 MED ORDER — DIAZEPAM 5 MG PO TABS
5.0000 mg | ORAL_TABLET | Freq: Two times a day (BID) | ORAL | Status: DC | PRN
  Administered 2018-07-14: 5 mg via ORAL
  Filled 2018-07-13: qty 1

## 2018-07-13 MED ORDER — LISINOPRIL 10 MG PO TABS: 10.0000 mg | ORAL_TABLET | ORAL | Status: DC

## 2018-07-13 MED ORDER — METOPROLOL TARTRATE 25 MG PO TABS
50.0000 mg | ORAL_TABLET | Freq: Once | ORAL | Status: AC
  Administered 2018-07-13: 50 mg via ORAL
  Filled 2018-07-13: qty 2

## 2018-07-13 MED ORDER — APIXABAN 5 MG PO TABS
5.0000 mg | ORAL_TABLET | Freq: Two times a day (BID) | ORAL | Status: DC
  Administered 2018-07-14 (×2): 5 mg via ORAL
  Filled 2018-07-13 (×3): qty 1

## 2018-07-13 MED ORDER — LACTATED RINGERS IV BOLUS
1000.0000 mL | Freq: Once | INTRAVENOUS | Status: AC
  Administered 2018-07-13: 1000 mL via INTRAVENOUS

## 2018-07-13 MED ORDER — POTASSIUM CHLORIDE CRYS ER 10 MEQ PO TBCR
10.0000 meq | EXTENDED_RELEASE_TABLET | Freq: Every day | ORAL | Status: DC
  Administered 2018-07-14: 10 meq via ORAL
  Filled 2018-07-13: qty 1

## 2018-07-13 MED ORDER — ASPIRIN EC 81 MG PO TBEC
81.0000 mg | DELAYED_RELEASE_TABLET | Freq: Every day | ORAL | Status: DC
  Administered 2018-07-14: 81 mg via ORAL
  Filled 2018-07-13: qty 1

## 2018-07-13 MED ORDER — ATORVASTATIN CALCIUM 40 MG PO TABS
40.0000 mg | ORAL_TABLET | Freq: Every day | ORAL | Status: DC
  Administered 2018-07-14: 40 mg via ORAL
  Filled 2018-07-13: qty 1

## 2018-07-13 MED ORDER — LEVOTHYROXINE SODIUM 50 MCG PO TABS
50.0000 ug | ORAL_TABLET | Freq: Every day | ORAL | Status: DC
  Administered 2018-07-14: 50 ug via ORAL
  Filled 2018-07-13: qty 1

## 2018-07-13 MED ORDER — HYDROCHLOROTHIAZIDE 25 MG PO TABS
25.0000 mg | ORAL_TABLET | Freq: Every day | ORAL | Status: DC
  Administered 2018-07-14: 25 mg via ORAL
  Filled 2018-07-13: qty 1

## 2018-07-13 NOTE — ED Provider Notes (Signed)
Patient placed in Quick Look pathway, seen and evaluated   Chief Complaint: Malaise, arm pain, knee pain  HPI:   77 year old female presents with generalized malaise, vomiting. She states she has felt this way since being discharged from the hospital for new onset A.fib. She also had a fall before she was in the hospital and has bilateral arm pain and knee pain. EMS was called his morning because her husband was having chest pain. She became anxious and vomited so EMS transported her as well. She denies feeling anxious, just unwell. No chest pain, SOB, abdominal pain, diarrhea  ROS: +malaise  Physical Exam:   Gen: No distress, baseline dysarthria and difficulty with word finding from stroke  Neuro: Awake and Alert  Skin: Warm    Focused Exam: Heart: Fast, irregularly irregular rhythm    Lungs: CTA    Abdomen: Soft, non tender   Initiation of care has begun. The patient has been counseled on the process, plan, and necessity for staying for the completion/evaluation, and the remainder of the medical screening examination    Bethel BornGekas, Alixandria Friedt Marie, PA-C 07/13/18 1524    Gwyneth SproutPlunkett, Whitney, MD 07/13/18 2032

## 2018-07-13 NOTE — ED Notes (Signed)
ED Provider at bedside. 

## 2018-07-13 NOTE — ED Notes (Signed)
Pt will be boarder until further notice; pt's husband/caregiver/legal guardian is currently a pt admitted to hospital and there is no one to care for this pt if he is not home. Pt's family states she is fall risk and cannot care for herself, there is no other family that can take care of pt without husband's help.

## 2018-07-13 NOTE — Progress Notes (Addendum)
CSW and RNCM met with pt at bedside. Pt normally stays at home with husband as her primary caregiver. Pt's husband administers her medication. Pt is able to get around the house and use the restroom if she has her cane. Pt does not have her can with her. Pt asked CSW and RNCM to speak with husband, another pt in ED.   CSW and RNCM spoke with pt's husband at pt's husband's bedside. Pt's husband stated that pt's husband's cardiologist stated that pt could stay inn his hospital room. Pt's husband is being brought into the hospital under observations.   CSW and RNCM went back to speak with pt. Pt stated that she does not want to stay with husband in husband's hospital room on a recliner. Pt requested to go home with step son, but step son is unable to stay with pt. Pt is unable to administer her own medications and remain safe at home alone.   CSW will continue to follow pt.   Wendelyn Breslow, [facility]  [phone]

## 2018-07-13 NOTE — ED Notes (Signed)
Patient transported to CT 

## 2018-07-13 NOTE — ED Triage Notes (Signed)
Pt has history of stroke, speech is at baseline

## 2018-07-13 NOTE — ED Provider Notes (Signed)
[facility] EMERGENCY DEPARTMENT Provider Note   CSN: [id number] Arrival date & time: 07/13/18  1432     History   Chief Complaint Chief Complaint  Patient presents with  . Anxiety  . Emesis    HPI [name] is a 77 y.o. female.   Patient came with EMS for emesis and fast heart rate.  Patient whose husband is caregiver is in the hospital and patient has no other take care of her.  Patient has not taken her medications today.  Otherwise has some nausea but is baseline.  Denies any chest pain, shortness of breath, abdominal pain.   The history is provided by the patient.   Illness   This is a chronic problem. The current episode started more than 1 week ago. The problem occurs daily. Pertinent negatives include no chest pain, no abdominal pain, no headaches and no shortness of breath.  Nothing aggravates the symptoms.  Nothing relieves the symptoms. She has tried nothing for the symptoms. The treatment provided no relief.    Past Medical History:  Diagnosis Date  . Abdominal discomfort    "due to medication intolerance"  . CAD (coronary artery disease)    previous stent  . Chronic pain syndrome 09/10/2013   Dr Nelva Bush,   . [place] Patient virus infection 1988  . Excessive daytime sleepiness 12/05/2014   Patient has not been seen for a sleep study as ordered, and used adderall to keep alert in daytime.  She agreed  In a contract not to have any scheduled medication for pain treatment from [facility] and will not receive refills for Adderall, which was initiated by dr Orland Penman, PCP>   . Fall   . Hyperlipemia   . Hypersomnia, persistent 09/10/2013   Patient on  Stimulants .  [place] Kitchen Hypertension   . Narcotic addiction ([employer]) 12/05/2014  . Obesity, unspecified 09/10/2013  . Shoulder joint pain    both shoulders    Patient Active Problem List   Diagnosis Date Noted  . New onset a-fib ([place]) 07/04/2018  . Atrial fibrillation with RVR ([place]) 07/02/2018  . Stroke (cerebrum)  ([place]) 07/02/2018  . Hypothyroidism 07/02/2018  . Chronic diastolic CHF (congestive heart failure) ([place]) 07/02/2018  . Acute CVA (cerebrovascular accident) ([place]) 05/19/2018  . Aphasia   . Dehydration 03/17/2018  . Generalized weakness 03/17/2018  . Near syncope 03/17/2018  . ARF (acute renal failure) ([place]) 03/16/2018  . Hyperglycemia 09/25/2017  . Type 2 diabetes mellitus without complication, with long-term current use of insulin ([place]) 04/24/2017  . ADD (attention deficit disorder) 12/30/2016  . Narcotic addiction ([place]) 12/05/2014  . Excessive daytime sleepiness 12/05/2014  . Alteration in psychomotor activity 12/05/2014  . Depression with somatization 12/05/2014  . Hypersomnia, persistent 09/10/2013  . Obesity, unspecified 09/10/2013  . Chronic pain syndrome 09/10/2013  . Depression 07/31/2013  . Medication intolerance 07/19/2013  . Anxiety 07/19/2013  . CAD (coronary artery disease) status post stent to distal right coronary artery 2006 06/20/2013  . Neurocardiogenic syncope 06/20/2013  . Essential hypertension 06/20/2013  . Hyperlipidemia 06/20/2013    Past Surgical History:  Procedure Laterality Date  . ANGIOPLASTY  03/01/2002   cutting balloon mid RCA  . BACK SURGERY    . CARDIAC CATHETERIZATION  01/26/2007   mild diffuse CAD  . CARDIAC CATHETERIZATION  10/23/2009   nonobstructive CAD,60% prox RCA,50% prox LAD, 50% mid ramus  . CORONARY STENT PLACEMENT  03/15/2005   distal RCA  . NEPHRECTOMY    . TEE  WITHOUT CARDIOVERSION N/A 07/03/2018   Procedure: TRANSESOPHAGEAL ECHOCARDIOGRAM (TEE);  Surgeon: Skeet Latch, MD;  Location: [facility] ENDOSCOPY;  Service: Cardiovascular;  Laterality: N/A;     OB History    Gravida  2   Para  2   Term  2   Preterm      AB      Living  2     SAB      TAB      Ectopic      Multiple      Live Births               Home Medications    Prior to Admission medications   Medication Sig Start Date End Date Taking?  Authorizing Provider  ACCU-CHEK SOFTCLIX LANCETS lancets USE TO TEST FOUR TIMES DAILY 04/29/18   McVey, Gelene Mink, PA-C  Alfalfa 250 MG TABS Take 1 tablet by mouth every morning.    [provider]  apixaban (ELIQUIS) 5 MG TABS tablet Take 1 tablet (5 mg total) by mouth 2 (two) times daily. 07/05/18   Hongalgi, Lenis Dickinson, MD  aspirin EC 81 MG EC tablet Take 1 tablet (81 mg total) by mouth daily. 07/06/18   Hongalgi, Lenis Dickinson, MD  atorvastatin (LIPITOR) 40 MG tablet Take 1 tablet (40 mg total) by mouth daily at 6 PM. 06/24/18   McVey, Gelene Mink, PA-C  blood glucose meter kit and supplies Dispense based on patient and insurance preference. Use up to four times daily as directed. (FOR ICD-9 250.00, 250.01). 09/27/17   Oswald Hillock, MD  Cholecalciferol (VITAMIN D3) 5000 units TBDP Take 1 capsule by mouth daily.    [provider]  CHROMIUM ASPARTATE PO Take 1 tablet by mouth every morning.    [provider]  Cinnamon Bark POWD Take 1 Dose by mouth daily.    [provider]  DHA-EPA-Coenzyme Q10-Vitamin E (GNP COQ-10 & FISH OIL) 120-180-50-30 CAPS Take 3 tablets by mouth daily.     [provider]  diazepam (VALIUM) 5 MG tablet TAKE 1 TABLET BY MOUTH EVERY 12 HOURS AS NEEDED FOR ANXIETY 04/07/18   McVey, Gelene Mink, PA-C  erythromycin ophthalmic ointment Place 1 application into the right eye 2 (two) times daily. 05/06/18   [provider]  fexofenadine (ALLEGRA) 180 MG tablet Take 180 mg by mouth daily as needed for allergies.     [provider]  fluorometholone (FML) 0.1 % ophthalmic suspension Place 1 drop into the right eye 4 (four) times daily. 05/05/18   [provider]  glucose blood (ACCU-CHEK AVIVA PLUS) test strip USE TO TEST FOUR TIMES DAILY 12/02/17   McVey, Gelene Mink, PA-C  hydrochlorothiazide (HYDRODIURIL) 25 MG tablet Take 25 mg by mouth daily.    [provider]  HYDROcodone-acetaminophen  (NORCO) 10-325 MG per tablet Take 1-2 tablets by mouth every 4 (four) hours as needed for moderate pain.  09/28/14   [provider]  Insulin Glargine (LANTUS SOLOSTAR) 100 UNIT/ML Solostar Pen Inject 24 Units into the skin daily at 10 pm. 10/24/17   McVey, Gelene Mink, PA-C  Insulin Syringe-Needle U-100 (INSULIN SYRINGE .5CC/31GX5/16") 31G X 5/16" 0.5 ML MISC Use as directed 09/27/17   Oswald Hillock, MD  levothyroxine (SYNTHROID, LEVOTHROID) 50 MCG tablet Take 1 tablet (50 mcg total) by mouth daily before breakfast. 03/07/17   McVey, Gelene Mink, PA-C  lisinopril (PRINIVIL,ZESTRIL) 20 MG tablet TAKE 1 TABLET BY MOUTH IN THE MORNING AND 1/2 (ONE-HALF)  AT BEDTIME PLEASE SCHEDULE AN APPT FOR FUTURE REFILLS Patient taking differently: Take 10-20 mg by mouth See admin instructions. TAKE 1 TABLET BY MOUTH IN THE MORNING AND 1/2 (ONE-HALF) AT BEDTIME PLEASE SCHEDULE AN APPT FOR FUTURE REFILLS 05/21/18   Croitoru, Mihai, MD  magnesium oxide (MAG-OX) 400 MG tablet Take 400 mg by mouth 2 (two) times daily.    [provider]  metoprolol tartrate (LOPRESSOR) 50 MG tablet Take 1 tablet (50 mg total) by mouth 2 (two) times daily. 07/05/18   Hongalgi, Lenis Dickinson, MD  Misc. Devices (HUGO ROLLING WALKER ELITE) MISC 1 Units by Does not apply route daily as needed. 11/29/16   McVey, Gelene Mink, PA-C  potassium chloride (K-DUR) 10 MEQ tablet Take 10 mEq by mouth daily. 05/06/18   [provider]  venlafaxine (EFFEXOR) 100 MG tablet TAKE 1 TABLET BY MOUTH ONCE DAILY 05/06/18   McVey, Gelene Mink, PA-C    Family History Family History  Problem Relation Age of Onset  . Hypertension Mother   . Diabetes Father   . Heart attack Father   . Heart attack Brother     Social History Social History   Tobacco Use  . Smoking status: Never Smoker  . Smokeless tobacco: Never Used  Substance Use Topics  . Alcohol use: No  . Drug use: No     Allergies   Azithromycin; Cardizem  [diltiazem hcl]; Codeine; Coreg [carvedilol]; Demerol [meperidine]; Erythromycin; Inderal [propranolol]; Procardia [nifedipine]; Wellbutrin [bupropion]; and Zoloft [sertraline hcl]   Review of Systems Review of Systems  Constitutional: Negative for chills and fever.  HENT: Negative for ear pain and sore throat.   Eyes: Negative for pain and visual disturbance.  Respiratory: Negative for cough and shortness of breath.   Cardiovascular: Negative for chest pain and palpitations.  Gastrointestinal: Negative for abdominal pain and vomiting.  Genitourinary: Negative for dysuria and hematuria.  Musculoskeletal: Negative for arthralgias and back pain.  Skin: Negative for color change and rash.  Neurological: Positive for facial asymmetry (baseline) and speech difficulty (baseline). Negative for seizures, syncope and headaches.  All other systems reviewed and are negative.    Physical Exam Updated Vital Signs  ED Triage Vitals  Enc Vitals Group     BP 07/13/18 1510 125/71     Pulse Rate 07/13/18 1510 (!) 135     Resp --      Temp 07/13/18 1510 98.2 F (36.8 C)     Temp Source 07/13/18 1510 Oral     SpO2 07/13/18 1510 95 %     Weight --      Height --      Head Circumference --      Peak Flow --      Pain Score 07/13/18 1437 0     Pain Loc --      Pain Edu? --      Excl. in [place]? --     Physical Exam  Constitutional: She is oriented to person, place, and time. She appears well-developed and well-nourished. No distress.  HENT:  Head: Normocephalic and atraumatic.  Mouth/Throat: No oropharyngeal exudate.  Eyes: Pupils are equal, round, and reactive to light. Conjunctivae and EOM are normal.  Neck: Normal range of motion. Neck supple.  Cardiovascular: Normal rate, normal heart sounds and intact distal pulses. An irregularly irregular rhythm present.  No murmur heard. Pulmonary/Chest: Effort normal and breath sounds normal. No respiratory distress.  Abdominal: Soft. There is no  tenderness.  Musculoskeletal: Normal range of motion.  She exhibits no edema.  Neurological: She is alert and oriented to person, place, and time.  Patient moves all extremities, normal sensation throughout, right-sided facial weakness is baseline, patient with expressive aphasia at baseline  Skin: Skin is warm and dry.  Psychiatric: She has a normal mood and affect.  Nursing note and vitals reviewed.    ED Treatments / Results  Labs (all labs ordered are listed, but only abnormal results are displayed) Labs Reviewed  COMPREHENSIVE METABOLIC PANEL - Abnormal; Notable for the following components:      Result Value   CO2 21 (*)    Glucose, Bld 259 (*)    All other components within normal limits  CBC - Abnormal; Notable for the following components:   WBC 12.3 (*)    All other components within normal limits  LIPASE, BLOOD  URINALYSIS, ROUTINE W REFLEX MICROSCOPIC  I-STAT TROPONIN, ED    EKG EKG Interpretation  Date/Time:  Monday July 13 2018 15:30:17 EDT Ventricular Rate:  109 PR Interval:    QRS Duration: 72 QT Interval:  346 QTC Calculation: 465 R Axis:   40 Text Interpretation:  Atrial flutter with variable A-V block ST & T wave abnormality, consider lateral ischemia Confirmed by Lennice Sites [id number]) on 07/13/2018 3:42:49 PM   Radiology Ct Head Wo Contrast  Result Date: 07/13/2018 CLINICAL DATA:  Generalized malaise, vomiting. Recent onset atrial fibrillation. EXAM: CT HEAD WITHOUT CONTRAST TECHNIQUE: Contiguous axial images were obtained from the base of the skull through the vertex without intravenous contrast. COMPARISON:  Head CT dated 05/19/2018. FINDINGS: Brain: Again noted is generalized age related parenchymal volume loss with commensurate dilatation of the ventricles and sulci. Chronic small vessel ischemic changes again noted within the bilateral periventricular and subcortical white matter regions. There is a new low-density area within the lower LEFT  parietal lobe, compatible with subacute to chronic infarction. No other interval change. No parenchymal or extra-axial hemorrhage. No mass effect, midline shift or herniation. Vascular: Chronic calcified atherosclerotic changes of the large vessels at the skull base. No unexpected hyperdense vessel. Skull: Normal. Negative for fracture or focal lesion. Sinuses/Orbits: No acute finding. Other: None. IMPRESSION: 1. New low-density area within the lower LEFT parietal lobe, compatible in appearance with subacute to chronic infarction, presumably occurring shortly after the most recent prior head CT of 05/19/2018. 2. No acute findings. No intracranial hemorrhage. No mass effect, midline shift or herniation. 3. Chronic small vessel ischemic changes within the white matter bilaterally. Electronically Signed   By: Franki Cabot M.D.   On: 07/13/2018 21:33    Procedures Procedures (including critical care time)  Medications Ordered in ED Medications  apixaban (ELIQUIS) tablet 5 mg (has no administration in time range)  aspirin EC tablet 81 mg (has no administration in time range)  atorvastatin (LIPITOR) tablet 40 mg (has no administration in time range)  diazepam (VALIUM) tablet 5 mg (has no administration in time range)  hydrochlorothiazide (HYDRODIURIL) tablet 25 mg (has no administration in time range)  levothyroxine (SYNTHROID, LEVOTHROID) tablet 50 mcg (has no administration in time range)  lisinopril (PRINIVIL,ZESTRIL) tablet 10-20 mg (has no administration in time range)  metoprolol tartrate (LOPRESSOR) tablet 50 mg (has no administration in time range)  potassium chloride (K-DUR) CR tablet 10 mEq (has no administration in time range)  venlafaxine (EFFEXOR) tablet 100 mg (has no administration in time range)  insulin aspart (novoLOG) injection 0-15 Units (has no administration in time range)  metoprolol tartrate (LOPRESSOR) tablet 50 mg (50  mg Oral Given 07/13/18 1819)  lactated ringers bolus 1,000  mL (0 mLs Intravenous Stopped 07/13/18 1921)     Initial Impression / Assessment and Plan / ED Course  I have reviewed the triage vital signs and the nursing notes.  Pertinent labs & imaging results that were available during my care of the patient were reviewed by me and considered in my medical decision making (see chart for details).     SARAYAH BACCHI is a 77 year old female with history of CVA with right-sided facial deficits, lower extremity deficits, insulin-dependent diabetes who presents to the ED with anxiety, emesis.  Patient with unremarkable vitals.  No fever.  EKG unremarkable.  Patient with atrial fibrillation however fairly rate controlled.  EKG is unchanged from prior.  Patient overall well-appearing and appears to be at her baseline.  She is here in the ED because her primary caregiver is here for evaluation.  Patient has no other take care of her.  She is extremely anxious about this and about her husband.  She has expressive aphasia on exam which is baseline.  Lab work collected overall unremarkable.  CT had shows no acute findings.  Patient got IV fluids given mild tachycardia.  Home medications ordered including Lopressor.  Social work was engaged to help with discharge as patient has a legal guardian who is her husband who is being admitted to the hospital.  She is unable to take care of herself at home.  Social work was unable to find any family members to help take care of the patient and therefore she was placed in border status and awaiting final social work placement.  Home medications ordered and diet ordered.  Hemodynamically stable throughout my care.  Final Clinical Impressions(s) / ED Diagnoses   Final diagnoses:  Vomiting without nausea, intractability of vomiting not specified, unspecified vomiting type    ED Discharge Orders    None      Lennice Sites, DO 07/13/18 2313

## 2018-07-13 NOTE — ED Triage Notes (Signed)
Pt in via EMS to triage c/o anxiety, pt husband was transported to the hospital as well and once EMS arrived to the house patient became upset, vomited and hyperventilating, pt calmed down during transport, no distress on arrival

## 2018-07-13 NOTE — ED Notes (Signed)
Patient asking about home meds which she was unable to take this afternoon

## 2018-07-14 ENCOUNTER — Other Ambulatory Visit: Payer: Self-pay

## 2018-07-14 DIAGNOSIS — R410 Disorientation, unspecified: Secondary | ICD-10-CM | POA: Diagnosis not present

## 2018-07-14 DIAGNOSIS — Z743 Need for continuous supervision: Secondary | ICD-10-CM | POA: Diagnosis not present

## 2018-07-14 DIAGNOSIS — F29 Unspecified psychosis not due to a substance or known physiological condition: Secondary | ICD-10-CM | POA: Diagnosis not present

## 2018-07-14 DIAGNOSIS — R112 Nausea with vomiting, unspecified: Secondary | ICD-10-CM | POA: Diagnosis not present

## 2018-07-14 DIAGNOSIS — R279 Unspecified lack of coordination: Secondary | ICD-10-CM | POA: Diagnosis not present

## 2018-07-14 LAB — CBG MONITORING, ED
GLUCOSE-CAPILLARY: 229 mg/dL — AB (ref 70–99)
Glucose-Capillary: 168 mg/dL — ABNORMAL HIGH (ref 70–99)
Glucose-Capillary: 218 mg/dL — ABNORMAL HIGH (ref 70–99)

## 2018-07-14 NOTE — Progress Notes (Signed)
Pt has a daughter and brother. Brothers name is Royal PiedraRoger Byrd [phone]. Brother has expressed the inability to care for pt at this time but has agreed to helping as much as possible with other aspects of pt's care.  Claude MangesKierra S. Garrette Caine, MSW, LCSW-A Emergency Department Clinical Social Worker [phone]

## 2018-07-14 NOTE — ED Provider Notes (Signed)
Social work came to make me aware that patient has access to 24-hour home health for the next several days and thus arrangements were made for discharge to home.  Legal guardian made aware of the situation.   [name], Kathleen Perry, DO 07/14/18 1636

## 2018-07-14 NOTE — ED Notes (Signed)
Called to check [facility] status, [place] [facility] states that they are still backed up, but will call this RN back with possible ETA or where pt is on the transport list. Pt made aware and husband made aware.

## 2018-07-14 NOTE — ED Notes (Addendum)
Lying on bed, alert. Waiting for PTAR. Pt is dressed in hospital gown - belongings in bag at bedside.

## 2018-07-14 NOTE — ED Notes (Signed)
Pt lying on bed - talking on her cell phone.

## 2018-07-14 NOTE — Progress Notes (Addendum)
CSW spoke with Denyse Amassorey with [facility]. [facility]. Pt can get into her home. Pt's step son will be there at 6 pm for when pt arrives. Denyse AmassCorey confirmed that services can start tonight at 7pm. Services are established until Thursday at 11 am. CSW updated pt's husband's social worker on the floor.   CSW updated pt's husband. Pt's husband agreeable to plan.   CSW called for PTAR.   Montine CircleKelsy Jaylanie Boschee, Silverio LayLCSWA [place] [facility]  [phone]

## 2018-07-14 NOTE — ED Notes (Signed)
PTAR here to transport pt. IV removed. Pt/PTAR given her cell phone and charger cord along with clothes. Husband called to let him know she is on the way home. He states that their son and the RN is there at the house waiting on her.

## 2018-07-14 NOTE — Progress Notes (Signed)
[facility]W still has not heard update from [facility] at this time. CSW to handoff to evening CSW as needed.    Claude MangesKierra S. Kendell Gammon, MSW, LCSW-A Emergency Department Clinical Social Worker [phone]

## 2018-07-14 NOTE — Progress Notes (Signed)
CSW following pt for possible discharge needs. CSW spoke with MD and was informed that pt is holding in the ED until a safe place has been found for pt. CSW is aware that pt's husband is in the hospital on 6E at this time. CSW has been speaking with regarding care for pt at this time. CSW informed pt's spouse that [facility] has [facility] as husband expressed that pt has been getting care from this agency. CSW was informed that per Denyse Amassorey with [facility] the agency MAY be able to offer overnight services to pt as pt was once active with their [facility] program. CSW was advised by Denyse Amassorey that he would follow up with CSW regarding this possibility.   CSW spoke with pt's spouse to give update and reiterated that pt remains in the ED being cared for at this time. CSW informed spouse that if [facility] was unable to take pt and care for pt for a few days overnight then CSW would follow up with spouse as pt would more than likely remain in the ED. CSW still following at this time.  [name], MSW, LCSW-A Emergency Department Clinical Social Worker [phone]

## 2018-07-15 ENCOUNTER — Telehealth: Payer: Self-pay | Admitting: Physician Assistant

## 2018-07-15 DIAGNOSIS — E119 Type 2 diabetes mellitus without complications: Secondary | ICD-10-CM | POA: Diagnosis not present

## 2018-07-15 DIAGNOSIS — I69328 Other speech and language deficits following cerebral infarction: Secondary | ICD-10-CM | POA: Diagnosis not present

## 2018-07-15 NOTE — Progress Notes (Unsigned)
CSW received call from [place] with [facility] informing CSW that aides from [facility] have been out to the home to assist with pt's care. Denyse AmassCorey expressed that when aides arrived on yesterday family answered the door but this morning when aides returned no one came to the door. Denyse AmassCorey expressed that pt's step son may be helping with pt's care however Kandee KeenCory not sure.   CSW went to speak with pt's spouse on unit to gather information on step son, however pt's spouse not in the room. RN on unit to call CSW back once pt is back in room.    Claude MangesKierra S. Delmont Prosch, MSW, LCSW-A Emergency Department Clinical Social Worker [phone]

## 2018-07-15 NOTE — Telephone Encounter (Signed)
Copied from CRM [id number]. Topic: Quick Communication - See Telephone Encounter >> Jul 15, 2018  3:52 PM Raquel SarnaHayes, Teresa G wrote: Rocky CraftsVanessa Smith WayneST , [facility] - [phone]  Elevated BP -  149/109 ( 07-15-18 at 3:42)  Husband is pt caregiver and he is in the hospital.  Pt cannot remember if she took her BP medication.  May be the reason for the elevated BP.

## 2018-07-16 NOTE — Telephone Encounter (Signed)
Copied from CRM [phone] [id number]. Topic: General - Other >> Jul 16, 2018  9:39 AM Gean BirchwoodWilliams-Neal, Sade R wrote: Rocky CraftsVanessa Smith from [facility] is calling in requesting a Skilled Nurse to go into home  to assess pt with medication assistance. Also, pt blood sugar was elevated last night may be a cause from medication being unmanaged  Cb# [phone]

## 2018-07-17 ENCOUNTER — Other Ambulatory Visit: Payer: Self-pay | Admitting: Physician Assistant

## 2018-07-17 DIAGNOSIS — F411 Generalized anxiety disorder: Secondary | ICD-10-CM

## 2018-07-17 NOTE — Telephone Encounter (Signed)
Klonopin refill Last Refill:04/07/18 #60 with 1 refill Last OV: 04/22/18 PCP: Alphonzo LemmingsWhitney McVey,Pa

## 2018-07-20 ENCOUNTER — Telehealth: Payer: Self-pay | Admitting: Physician Assistant

## 2018-07-20 DIAGNOSIS — I4891 Unspecified atrial fibrillation: Secondary | ICD-10-CM | POA: Diagnosis not present

## 2018-07-20 DIAGNOSIS — I69320 Aphasia following cerebral infarction: Secondary | ICD-10-CM | POA: Diagnosis not present

## 2018-07-20 NOTE — Telephone Encounter (Signed)
Left VM for Kathleen CraftsVanessa Williams approving request.

## 2018-07-20 NOTE — Telephone Encounter (Signed)
Copied from CRM [id number]. Topic: General - Other >> Jul 20, 2018 11:48 AM Percival SpanishKennedy, Cheryl W wrote: Pt is asking if she it to still the below meds and if so she need refilss    hydrochlorothiazide (HYDRODIURIL) 25 MG tablet     and ATORVASATIN    [facility]

## 2018-07-21 ENCOUNTER — Other Ambulatory Visit: Payer: Self-pay | Admitting: *Deleted

## 2018-07-21 MED ORDER — HYDROCHLOROTHIAZIDE 25 MG PO TABS: 25.0000 mg | ORAL_TABLET | Freq: Every day | ORAL | 0 refills | Status: DC

## 2018-07-21 MED ORDER — ATORVASTATIN CALCIUM 40 MG PO TABS: 40.0000 mg | ORAL_TABLET | Freq: Every day | ORAL | 0 refills | Status: DC

## 2018-07-21 NOTE — Telephone Encounter (Signed)
Patient is requesting a refill of the following medications: Requested Prescriptions   Pending Prescriptions Disp Refills  . diazepam (VALIUM) 5 MG tablet [Pharmacy Med Name: DIAZEPAM 5MG         TAB] 60 tablet 1    Sig: TAKE 1 TABLET BY MOUTH EVERY 12 HOURS AS NEEDED FOR ANXIETY    Date of patient request: 07/17/2018 Last office visit: 04/22/2018 Date of last refill: 04/07/2018 Last refill amount: 60 RF 1  Follow up time period per chart:

## 2018-07-22 DIAGNOSIS — I69320 Aphasia following cerebral infarction: Secondary | ICD-10-CM | POA: Diagnosis not present

## 2018-07-22 DIAGNOSIS — I4891 Unspecified atrial fibrillation: Secondary | ICD-10-CM | POA: Diagnosis not present

## 2018-07-23 ENCOUNTER — Other Ambulatory Visit: Payer: Self-pay | Admitting: Physician Assistant

## 2018-07-23 DIAGNOSIS — I69320 Aphasia following cerebral infarction: Secondary | ICD-10-CM | POA: Diagnosis not present

## 2018-07-23 DIAGNOSIS — F419 Anxiety disorder, unspecified: Secondary | ICD-10-CM

## 2018-07-23 DIAGNOSIS — I4891 Unspecified atrial fibrillation: Secondary | ICD-10-CM | POA: Diagnosis not present

## 2018-07-24 ENCOUNTER — Other Ambulatory Visit: Payer: Self-pay | Admitting: *Deleted

## 2018-07-24 ENCOUNTER — Telehealth: Payer: Self-pay | Admitting: Physician Assistant

## 2018-07-24 MED ORDER — LEVOTHYROXINE SODIUM 50 MCG PO TABS: 50.0000 ug | ORAL_TABLET | Freq: Every day | ORAL | 5 refills | Status: DC

## 2018-07-24 NOTE — Telephone Encounter (Signed)
Copied from CRM [id number]. Topic: General - Other >> Jul 24, 2018 10:05 AM Leafy Roobinson, Norma J wrote: Reason for CRM: betty rn [facility] is calling and would like verbal orders for medication management for once a  wk for 4 wk

## 2018-07-30 ENCOUNTER — Ambulatory Visit: Payer: Medicare Other | Admitting: Cardiology

## 2018-07-30 DIAGNOSIS — I69320 Aphasia following cerebral infarction: Secondary | ICD-10-CM | POA: Diagnosis not present

## 2018-07-30 DIAGNOSIS — I4891 Unspecified atrial fibrillation: Secondary | ICD-10-CM | POA: Diagnosis not present

## 2018-07-30 NOTE — Telephone Encounter (Signed)
Please advise. Dgaddy, CMA 

## 2018-07-30 NOTE — Telephone Encounter (Signed)
Please call Teofilo Pod and okay verbal order for medication management. Thank you!

## 2018-07-31 NOTE — Telephone Encounter (Signed)
Verbal order given for medication management for once a week for 4 weeks per McVey. Dgaddy, CMA

## 2018-08-04 DIAGNOSIS — I69320 Aphasia following cerebral infarction: Secondary | ICD-10-CM | POA: Diagnosis not present

## 2018-08-04 DIAGNOSIS — I4891 Unspecified atrial fibrillation: Secondary | ICD-10-CM | POA: Diagnosis not present

## 2018-08-06 DIAGNOSIS — I4891 Unspecified atrial fibrillation: Secondary | ICD-10-CM | POA: Diagnosis not present

## 2018-08-06 DIAGNOSIS — I69320 Aphasia following cerebral infarction: Secondary | ICD-10-CM | POA: Diagnosis not present

## 2018-08-10 ENCOUNTER — Other Ambulatory Visit: Payer: Self-pay | Admitting: Physician Assistant

## 2018-08-10 NOTE — Telephone Encounter (Signed)
Apixaban refill Last Refill: 07/05/18  Last OV: 04/22/18 PCP: Alphonzo LemmingsWhitney McVey [facility] [address] [place], [place]   Metoptolot refill Last Refill: 07/05/18 Last OV: 04/22/18 PCP: Marco CollieWhitney McVey [facility] [address] [place], [place]   These medications were written by Dr Al CorpusHongali

## 2018-08-10 NOTE — Telephone Encounter (Signed)
Copied from CRM [id number]. Topic: Quick Communication - Rx Refill/Question >> Aug 10, 2018 12:13 PM Angela NevinWilliams, Candice N wrote: Medication: apixaban (ELIQUIS) 5 MG TABS tablet and metoprolol tartrate (LOPRESSOR) 50 MG tablet   Pt is requesting refill of these medications. Pt is completely out of both. Please advise.  Has the patient contacted their pharmacy? Yes.   Preferred Pharmacy (with phone number or street name): [facility] [address] - [place], [place] - [id number] [phone] (Phone) [phone] (Fax)

## 2018-08-11 DIAGNOSIS — Z79899 Other long term (current) drug therapy: Secondary | ICD-10-CM | POA: Diagnosis not present

## 2018-08-11 DIAGNOSIS — M5030 Other cervical disc degeneration, unspecified cervical region: Secondary | ICD-10-CM | POA: Diagnosis not present

## 2018-08-11 NOTE — Telephone Encounter (Signed)
Spoke with [name] at [facility] regarding refill requests; she states that she will hand deliver this request to the provider.

## 2018-08-12 ENCOUNTER — Other Ambulatory Visit: Payer: Self-pay | Admitting: Physician Assistant

## 2018-08-12 ENCOUNTER — Ambulatory Visit: Payer: PRIVATE HEALTH INSURANCE | Admitting: Adult Health

## 2018-08-12 DIAGNOSIS — I4891 Unspecified atrial fibrillation: Secondary | ICD-10-CM

## 2018-08-12 DIAGNOSIS — I10 Essential (primary) hypertension: Secondary | ICD-10-CM

## 2018-08-12 MED ORDER — METOPROLOL TARTRATE 50 MG PO TABS: 50.0000 mg | ORAL_TABLET | Freq: Two times a day (BID) | ORAL | 3 refills | Status: DC

## 2018-08-12 MED ORDER — APIXABAN 5 MG PO TABS: 5.0000 mg | ORAL_TABLET | Freq: Two times a day (BID) | ORAL | 3 refills | Status: DC

## 2018-08-13 ENCOUNTER — Telehealth: Payer: Self-pay

## 2018-08-13 ENCOUNTER — Encounter: Payer: Self-pay | Admitting: Adult Health

## 2018-08-13 NOTE — Telephone Encounter (Signed)
PT no show for hospital follow up on 08/12/2018.Pt saw Dr.Xu in the hospital in 06/2018 for atrial fibrillation and LA thrombus.

## 2018-08-14 DIAGNOSIS — I4891 Unspecified atrial fibrillation: Secondary | ICD-10-CM | POA: Diagnosis not present

## 2018-08-14 DIAGNOSIS — I69320 Aphasia following cerebral infarction: Secondary | ICD-10-CM | POA: Diagnosis not present

## 2018-08-17 ENCOUNTER — Telehealth: Payer: Self-pay | Admitting: Physician Assistant

## 2018-08-17 NOTE — Telephone Encounter (Signed)
Copied from CRM [id number]. Topic: Quick Communication - See Telephone Encounter >> Aug 17, 2018 11:38 AM Lorrine KinMcGee, Kimberly Coye B, NT wrote: CRM for notification. See Telephone encounter for: 08/17/18. Kathie [facility]Betty with [facility] calling and states that the patient currently going to [facility] for pain control. States that she doctor that she is seeing there is only giving norco 1 tab every 6 hours. Would like to know if Dr Dala DockMcVey could give a referral to a designated pain clinic to get her pain under control. States that the patient just sits and cries in pain. Please advise CB#: [phone]

## 2018-08-18 ENCOUNTER — Other Ambulatory Visit: Payer: Self-pay | Admitting: Physician Assistant

## 2018-08-18 DIAGNOSIS — M25561 Pain in right knee: Secondary | ICD-10-CM

## 2018-08-18 DIAGNOSIS — G8929 Other chronic pain: Secondary | ICD-10-CM

## 2018-08-18 DIAGNOSIS — M25562 Pain in left knee: Principal | ICD-10-CM

## 2018-08-18 NOTE — Telephone Encounter (Signed)
Referral to pain management placed.

## 2018-08-20 DIAGNOSIS — I4891 Unspecified atrial fibrillation: Secondary | ICD-10-CM | POA: Diagnosis not present

## 2018-08-20 DIAGNOSIS — I69320 Aphasia following cerebral infarction: Secondary | ICD-10-CM | POA: Diagnosis not present

## 2018-09-04 ENCOUNTER — Telehealth: Payer: Self-pay | Admitting: Physician Assistant

## 2018-09-04 NOTE — Telephone Encounter (Signed)
Called pt set up appt 5:00 10.15.2019 advised and late policy

## 2018-09-08 ENCOUNTER — Ambulatory Visit: Payer: Medicare Other | Admitting: Physician Assistant

## 2018-09-10 ENCOUNTER — Encounter: Payer: Self-pay | Admitting: Adult Health

## 2018-09-10 ENCOUNTER — Ambulatory Visit: Payer: Medicare Other | Admitting: Adult Health

## 2018-09-14 ENCOUNTER — Telehealth: Payer: Self-pay | Admitting: Physician Assistant

## 2018-09-14 NOTE — Telephone Encounter (Signed)
Copied from CRM [id number]. Topic: Quick Communication - See Telephone Encounter >> Sep 14, 2018  2:29 PM Jay Schlichter wrote: CRM for notification. See Telephone encounter for: 09/14/18. Pt called - she says she takes adderall and it was taken off her medication list when she was in the hospital She is out of this medication. She would like or have this refilled again, or something else that will help with her energy.  She has hosp follow up on 09/30/18  [facility]  Cb is [phone]

## 2018-09-15 NOTE — Telephone Encounter (Signed)
Patient requesting restarting Adderall 20 mg when she was in the hospital it was stopped. She wants a prescription or something else because it helps her energy. She has an appointment scheduled 09/30/2018 for hospital follow up. Please advise, thank you.

## 2018-09-16 ENCOUNTER — Other Ambulatory Visit: Payer: Self-pay | Admitting: Physician Assistant

## 2018-09-16 DIAGNOSIS — F419 Anxiety disorder, unspecified: Secondary | ICD-10-CM

## 2018-09-23 DIAGNOSIS — M25561 Pain in right knee: Secondary | ICD-10-CM | POA: Diagnosis not present

## 2018-09-23 DIAGNOSIS — M25562 Pain in left knee: Secondary | ICD-10-CM | POA: Diagnosis not present

## 2018-09-23 DIAGNOSIS — M170 Bilateral primary osteoarthritis of knee: Secondary | ICD-10-CM | POA: Diagnosis not present

## 2018-09-24 ENCOUNTER — Other Ambulatory Visit: Payer: Self-pay | Admitting: Physician Assistant

## 2018-09-24 DIAGNOSIS — F419 Anxiety disorder, unspecified: Secondary | ICD-10-CM

## 2018-09-30 ENCOUNTER — Encounter: Payer: Self-pay | Admitting: Physician Assistant

## 2018-09-30 ENCOUNTER — Ambulatory Visit (INDEPENDENT_AMBULATORY_CARE_PROVIDER_SITE_OTHER): Payer: Medicare Other | Admitting: Physician Assistant

## 2018-09-30 ENCOUNTER — Other Ambulatory Visit: Payer: Self-pay

## 2018-09-30 ENCOUNTER — Encounter

## 2018-09-30 VITALS — BP 110/78 | HR 106 | Temp 97.8°F | Resp 12 | Ht 63.0 in

## 2018-09-30 DIAGNOSIS — F329 Major depressive disorder, single episode, unspecified: Secondary | ICD-10-CM

## 2018-09-30 DIAGNOSIS — T7631XA Adult psychological abuse, suspected, initial encounter: Secondary | ICD-10-CM

## 2018-09-30 DIAGNOSIS — E1169 Type 2 diabetes mellitus with other specified complication: Secondary | ICD-10-CM | POA: Diagnosis not present

## 2018-09-30 LAB — POCT GLYCOSYLATED HEMOGLOBIN (HGB A1C): Hemoglobin A1C: 10.6 % — AB (ref 4.0–5.6)

## 2018-09-30 LAB — GLUCOSE, POCT (MANUAL RESULT ENTRY): POC Glucose: 314 mg/dl — AB (ref 70–99)

## 2018-09-30 MED ORDER — METFORMIN HCL ER 500 MG PO TB24: ORAL_TABLET | ORAL | 2 refills | Status: DC

## 2018-09-30 MED ORDER — VENLAFAXINE HCL ER 150 MG PO CP24: 150.0000 mg | ORAL_CAPSULE | Freq: Every day | ORAL | 3 refills | Status: AC
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
## 2018-09-30 NOTE — Patient Instructions (Addendum)
1) Stop taking Effexor 100 mg. Start taking Effexor XR 150mg .   2) Your blood sugar is way too high! Start taking Metformin extended release - this has fewer side effects.  Metformin Dosing (to be taken with food) Week 1: take 1/2 tablet twice a day. Week 2: take 1 tablet in the morning, 1/2 tablet at night. Week 3: take 2 tablets twice a day.   3) You will receive a phone call to schedule an appointment with therapy and home care. Home care can hook you up with transportation to your therapy appointments if needed- so let them know if you need help with rides. Or I can fill out SCAT paperwork (for public transportation assistance to pick you up and drop you at home)  4) Come back and see me December 4   Diabetes Mellitus and Nutrition When you have diabetes (diabetes mellitus), it is very important to have healthy eating habits because your blood sugar (glucose) levels are greatly affected by what you eat and drink. Eating healthy foods in the appropriate amounts, at about the same times every day, can help you:  Control your blood glucose.  Lower your risk of heart disease.  Improve your blood pressure.  Reach or maintain a healthy weight.  Every person with diabetes is different, and each person has different needs for a meal plan. Your health care provider may recommend that you work with a diet and nutrition specialist (dietitian) to make a meal plan that is best for you. Your meal plan may vary depending on factors such as:  The calories you need.  The medicines you take.  Your weight.  Your blood glucose, blood pressure, and cholesterol levels.  Your activity level.  Other health conditions you have, such as heart or kidney disease.  How do carbohydrates affect me? Carbohydrates affect your blood glucose level more than any other type of food. Eating carbohydrates naturally increases the amount of glucose in your blood. Carbohydrate counting is a method for keeping  track of how many carbohydrates you eat. Counting carbohydrates is important to keep your blood glucose at a healthy level, especially if you use insulin or take certain oral diabetes medicines. It is important to know how many carbohydrates you can safely have in each meal. This is different for every person. Your dietitian can help you calculate how many carbohydrates you should have at each meal and for snack. Foods that contain carbohydrates include:  Bread, cereal, rice, pasta, and crackers.  Potatoes and corn.  Peas, beans, and lentils.  Milk and yogurt.  Fruit and juice.  Desserts, such as cakes, cookies, ice cream, and candy.  How does alcohol affect me? Alcohol can cause a sudden decrease in blood glucose (hypoglycemia), especially if you use insulin or take certain oral diabetes medicines. Hypoglycemia can be a life-threatening condition. Symptoms of hypoglycemia (sleepiness, dizziness, and confusion) are similar to symptoms of having too much alcohol. If your health care provider says that alcohol is safe for you, follow these guidelines:  Limit alcohol intake to no more than 1 drink per day for nonpregnant women and 2 drinks per day for men. One drink equals 12 oz of beer, 5 oz of wine, or 1 oz of hard liquor.  Do not drink on an empty stomach.  Keep yourself hydrated with water, diet soda, or unsweetened iced tea.  Keep in mind that regular soda, juice, and other mixers may contain a lot of sugar and must be counted as carbohydrates.  What are tips for following this plan? Reading food labels  Start by checking the serving size on the label. The amount of calories, carbohydrates, fats, and other nutrients listed on the label are based on one serving of the food. Many foods contain more than one serving per package.  Check the total grams (g) of carbohydrates in one serving. You can calculate the number of servings of carbohydrates in one serving by dividing the total  carbohydrates by 15. For example, if a food has 30 g of total carbohydrates, it would be equal to 2 servings of carbohydrates.  Check the number of grams (g) of saturated and trans fats in one serving. Choose foods that have low or no amount of these fats.  Check the number of milligrams (mg) of sodium in one serving. Most people should limit total sodium intake to less than 2,300 mg per day.  Always check the nutrition information of foods labeled as "low-fat" or "nonfat". These foods may be higher in added sugar or refined carbohydrates and should be avoided.  Talk to your dietitian to identify your daily goals for nutrients listed on the label. Shopping  Avoid buying canned, premade, or processed foods. These foods tend to be high in fat, sodium, and added sugar.  Shop around the outside edge of the grocery store. This includes fresh fruits and vegetables, bulk grains, fresh meats, and fresh dairy. Cooking  Use low-heat cooking methods, such as baking, instead of high-heat cooking methods like deep frying.  Cook using healthy oils, such as olive, canola, or sunflower oil.  Avoid cooking with butter, cream, or high-fat meats. Meal planning  Eat meals and snacks regularly, preferably at the same times every day. Avoid going long periods of time without eating.  Eat foods high in fiber, such as fresh fruits, vegetables, beans, and whole grains. Talk to your dietitian about how many servings of carbohydrates you can eat at each meal.  Eat 4-6 ounces of lean protein each day, such as lean meat, chicken, fish, eggs, or tofu. 1 ounce is equal to 1 ounce of meat, chicken, or fish, 1 egg, or 1/4 cup of tofu.  Eat some foods each day that contain healthy fats, such as avocado, nuts, seeds, and fish. Lifestyle   Check your blood glucose regularly.  Exercise at least 30 minutes 5 or more days each week, or as told by your health care provider.  Take medicines as told by your health care  provider.  Do not use any products that contain nicotine or tobacco, such as cigarettes and e-cigarettes. If you need help quitting, ask your health care provider.  Work with a Veterinary surgeon or diabetes educator to identify strategies to manage stress and any emotional and social challenges. What are some questions to ask my health care provider?  Do I need to meet with a diabetes educator?  Do I need to meet with a dietitian?  What number can I call if I have questions?  When are the best times to check my blood glucose? Where to find more information:  American Diabetes Association: diabetes.org/food-and-fitness/food  Academy of Nutrition and Dietetics: https://www.vargas.com/  [employer] of Diabetes and Digestive and Kidney Diseases (NIH): FindJewelers.cz Summary  A healthy meal plan will help you control your blood glucose and maintain a healthy lifestyle.  Working with a diet and nutrition specialist (dietitian) can help you make a meal plan that is best for you.  Keep in mind that carbohydrates and alcohol have immediate  effects on your blood glucose levels. It is important to count carbohydrates and to use alcohol carefully. This information is not intended to replace advice given to you by your health care provider. Make sure you discuss any questions you have with your health care provider. Document Released: 08/08/2005 Document Revised: 12/16/2016 Document Reviewed: 12/16/2016 Elsevier Interactive Patient Education  [employer].

## 2018-10-01 DIAGNOSIS — T7631XA Adult psychological abuse, suspected, initial encounter: Secondary | ICD-10-CM | POA: Insufficient documentation

## 2018-10-02 ENCOUNTER — Telehealth: Payer: Self-pay | Admitting: Physician Assistant

## 2018-10-02 ENCOUNTER — Other Ambulatory Visit: Payer: Self-pay | Admitting: Licensed Clinical Social Worker

## 2018-10-02 ENCOUNTER — Other Ambulatory Visit: Payer: Self-pay | Admitting: *Deleted

## 2018-10-02 NOTE — Patient Outreach (Signed)
[facility]) Care Management  10/02/2018  [name] Oct 29, 1941 [id number]  Telephone Screen  Referral Date:10/01/18 Referral Source: MD referral from [facility]  Referral Reason: Diabetes management, post-CVA. Husband is primary caregiver - needs to be held accountable for medication compliance and DM-friendly diet.   Diagnoses of Diabetes  Stroke: Ischemic/TIA  Insurance:medicare, AARP   Outreach attempt # 1 successful to the home number  Patient is able to verify HIPAA Reviewed and addressed referral to [facility] with patient She confirms her visit to Dr Dala Dock on 09/30/18  When asked about her immediate concerns she is "always really tired", right now sh is concern about her feet and knee injections that she having to start because it is affecting her walking abilities  CM noted [name] is having word finding issues related to aphasia during the call and she states "it is frustrating" She report previously being seen by a [place] speech therapist, Cathleen Fears she does not understand her DM heart healthy diet. She did not understand what CHF was Cm provided an explanation  At the end of the assessment she gave [facility] RN CM permission to speak with her husband [name] as she reports she was getting tired  [facility] admissions x 2 ED visits x 1 in the last 6 months   Social: Lives with her husband,[name], her primary care giver. She reports needing assistance with her ADLs and is assist/dependent with iADLs. Mr [name] provides transportation to appointments  She reports being on a fixed income, SSI, and at intervals having financial issues especially when it relates to getting medications and diet   Conditions: HTN CAD, Stroke -June 2019 , Chronic diastolic CHF, Type 2 DM, Hypothyroidism, Acute renal failure, hyperlipidemia, depression, anxiety, chronic pain syndrome , ADD, excessive daytime sleepiness, obesity, narcotic dependency, aphasia  Falls She  denies falls in last 3-6 months Reports a fall "a very long time ago"   Medications: cost concerns with Eliquis $113 Her husband is also on Eliquis   Uses a salonpas cream and pads on her arm She voices a concern with use of this OTC product and wanted to know if there was a Rx to assist with the pain in her arms. She also voiced a concern about not being able to find clothes to accommodate her arm after putting on this cream and a brace. CM discussed possible use of a hospital gown from a local medical supply store (Mr Weekes was given the contact information) and an idea to use a snap button gown from a local department store   DME accu chek glucose meter, walker, cane, w/c  Appointments: Last saw her Primary MD on 09/30/18  Goes to [facility] clinic in [place] for knee injection care  See a cardiology, croituru  Advance Directives: She has a living will and POA and is not voicing need of changes    Consent: [facility] RN CM reviewed [facility] services with patient. Patient gave verbal consent for services  [facility], [facility], [facility] RN CM.  Plan: [facility] RN CM will refer to [facility] (community resources, depression/ ED visit for anxiety, financial concerns at intervals) , [facility] (cost of eliquis, coverage gap, ?pain treatment for knees/arms, polypharmacy, medication compliance) , [facility] Community RN CM (disease management, dependent with ADLs, Re admission prevention, DM heart healthy diet education, medication compliance, [facility] admissions x 2 ED visits x 1 in the last 6 months)   Caitlyn Buchanan L. Noelle Penner, RN, BSN, CCM  [facility] Telephonic Care Management Care Coordinator Office number [phone] Mobile number [phone]  Main [facility] number [phone] Fax number [phone]

## 2018-10-02 NOTE — Patient Outreach (Addendum)
[facility]) Care Management  10/02/2018  [name] 1941-06-08 [id number]  [facility] CSW received new referral on 10/02/18 that states: "Patient needs assistance for community resources, depression/ ED visit for anxiety, financial concerns at intervals." [facility] CSW completed initial outreach call but was unable to reach patient successfully as mailbox was full. [facility] CSW will complete second outreach attempt within one week.  Dickie La, BSW, MSW, LCSW [facility] [email or web address] Phone: [phone] Fax: [phone]

## 2018-10-02 NOTE — Telephone Encounter (Signed)
Patient husband called back and asked if Dr Dala Dock can just send in an Rx to the pharmacy for the 50 mg Effexor XR so that his wife can take it with the 100 mg that they already have at home as to not waste the medication or money by disposing of what they already have. Stated that she would need a quantity of 50 pills. Please advise Ph#   [phone]

## 2018-10-02 NOTE — Telephone Encounter (Signed)
Message sent to [facility] re: Effexor XR dosing.

## 2018-10-02 NOTE — Telephone Encounter (Signed)
Left message on husband's voice mail that Effexor XR can not be cut or divided. He could check with the pt.'s pharmacy about the residual Effexor XR they just picked up.

## 2018-10-02 NOTE — Telephone Encounter (Signed)
Copied from CRM [id number]. Topic: Quick Communication - See Telephone Encounter >> Oct 02, 2018 10:54 AM Lorrine Kin, NT wrote: CRM for notification. See Telephone encounter for: 10/02/18. Patient's husband calling and states that [facility] prescribed venlafaxine XR (EFFEXOR XR) 150 MG 24 hr capsule. States that they just got the 100mg  refill and would like to know what they can do so they are not wasting the 100mg  effexor that they just had refilled. Please advise.

## 2018-10-05 ENCOUNTER — Other Ambulatory Visit: Payer: Self-pay | Admitting: *Deleted

## 2018-10-05 NOTE — Telephone Encounter (Signed)
The Effexor 100mg  he just picked up is not the extended release (XR). I'd like the pt to start taking extended release.

## 2018-10-05 NOTE — Patient Outreach (Signed)
[facility]) Care Management  10/05/2018  [name] 1941-03-31 [id number]    Telephone Assessment  RN spoke with pt today and introduced the [facility] program and services. Verified identifiers and inquired if this was a good time to discussed services. Pt on her way to a provider's visit. RN offered to call back on another day or time. Pt requested a call back tomorrow. RN will attempt another outreach on tomorrow as requested. Will further engage at that time.  Elliot Cousin, RN Care Management Coordinator [facility] Main Office [phone]

## 2018-10-06 ENCOUNTER — Ambulatory Visit: Payer: Self-pay | Admitting: Pharmacist

## 2018-10-06 ENCOUNTER — Other Ambulatory Visit: Payer: Self-pay | Admitting: *Deleted

## 2018-10-06 ENCOUNTER — Other Ambulatory Visit: Payer: Self-pay | Admitting: Licensed Clinical Social Worker

## 2018-10-06 ENCOUNTER — Other Ambulatory Visit: Payer: Self-pay | Admitting: Pharmacist

## 2018-10-06 NOTE — Patient Outreach (Signed)
[facility]) Care Management  [facility]  10/06/2018  Kathleen DeerJanice B Williams [birth year] [id number]   Reason for referral: medication assistance  Unsuccessful telephone call attempt #1 to patient.   HIPAA compliant voicemail left requesting a return call  Plan:  I will make another outreach attempt to patient within 3-4 business days  I will mail unsuccessful letter  Kieth BrightlyJulie Dattero Aishia Barkey, PharmD, [facility] Clinical Pharmacist [facility]  [phone]

## 2018-10-06 NOTE — Patient Outreach (Signed)
[facility]) Care Management  10/06/2018  Foye DeerJanice B Sylvain 08-Jan-1941 [id number]  Assessment-CSW completed second outreach attempt today. CSW unable to reach patient successfully. CSW is able to leave a voice message this time and left a HIPPA compliant voice message encouraging patient to return call once available.  Plan-CSW will await return call or complete an additional outreach if needed.  [name] Della Homan, BSW, MSW, LCSW [facility] Care Management [email or web address] Phone: [phone] Fax: [phone]

## 2018-10-06 NOTE — Patient Outreach (Signed)
[facility]) Care Management  10/06/2018  Foye DeerJanice B Barkalow [birth year] [id number]    RN second attempt to successful initiate [facility] program and services however remains unsuccessful. Note team member has send outreach letter. Therefore RN will rescheduled one additional outreach call for HTN services over the next week. RN was able to leave a HIPAA approved voice message requesting a call back to intervene further on pt's possible needs.  Elliot CousinLisa Inayah Woodin, RN Care Management Coordinator [facility] Main Office [phone]

## 2018-10-06 NOTE — Patient Outreach (Addendum)
[facility]) Care Management  10/06/2018  [name] 18-May-1941 [id number]  [facility] received incoming return call from patient's spouse on 10/06/18. HIPPA verifications received successfully. [facility] CSW introduced self, reason for initial call and of [facility] social work services. Spouse reports being interested in [employer] resource support, financial support, mental health resources and personal care service resources. Spouse reports that he prefers for all [facility] CM calls to be made to him at number [phone]. Spouse shares that patient had a stroke back in June and often gets tired talking on the phone and would prefer for husband to be the person of contact for services. Spouse was educated on available mental health resources. Patient reports that patient was recently placed on a new medication Effexor to help patient cope with her depression and anxiety. Patient is also prescribed to Valium and was placed on this medication on 07/29/18. Spouse reports not knowing if new medication is working yet because she just started it last week. Spouse reports that patient is not interested in gaining individual or group counseling at this time but is agreeable to [facility] CSW mailing family a complete list of mental health resources available in case patient wishes to pursue them in the future. [facility] CSW will mail mental health resources and will follow up within two weeks to ensure resources were successfully received and all questions have been answered. Spouse reports that he is suffering from caregiver burnout and would benefit from gaining an aide. [facility] CSW provided education on Medicaid eligibility and its' enrollment process. Spouse is agreeable to [facility] CSW transitioning referral to [facility] BSW at this time to assist with family with gaining community resources, financial assistance resources, possible Medicaid and personal care services. [facility] CSW will follow up with spouse within two weeks to make sure  mental health resources were successfully received by mail.  [name] Dorene Bruni, BSW, MSW, LCSW [facility] Care Management [email or web address] Phone: [phone] Fax: [phone]

## 2018-10-07 ENCOUNTER — Other Ambulatory Visit: Payer: Self-pay

## 2018-10-07 NOTE — Patient Outreach (Signed)
[facility]) Care Management  10/07/2018  [name] [birth year] [id number]   Request received from CSW, [name] [name], to send mental health resources to patient.  List of programs through [facility] [place] and list of local mental health organizations was mailed today.   Malachy ChamberAmber Marline Morace, BSW Social Worker [phone]

## 2018-10-07 NOTE — Patient Outreach (Addendum)
[facility]) Care Management  10/07/2018  [name] January 03, 1941 [id number]   Initial outreach to patient's spouse, Olen CordialDan Lemler, regarding social work referral for personal care services and community resources.  [name] is the primary caregiver for patient and reported that he could use help with day to day care.  BSW and [name] had a lengthy conversation about in-home services that are covered by Medicare versus Medicaid.  [name] denied being able to afford paying for aide services out of pocket.  He and BSW discussed the Medicaid application process and he said that he would like to apply.  BSW talked with him about other options such as family, friends, or church members.  Per [name], their son provides some help with things around the house and they have a daughter but "you can't depend on her". [name] said that they have also considered Assisted Living. BSW and [name] talked about caregiver resources through [facility] [place] as he reported feeling overwhelmed.   BSW is mailing the following: Medicaid application Documents to submit with Medicaid application [facility] [facility]. BSW will follow up next week to ensure receipt of resources.   Malachy ChamberAmber Ethyn Schetter, BSW Social Worker [phone]

## 2018-10-09 ENCOUNTER — Ambulatory Visit: Payer: Self-pay | Admitting: Pharmacist

## 2018-10-12 ENCOUNTER — Other Ambulatory Visit: Payer: Self-pay | Admitting: *Deleted

## 2018-10-12 NOTE — Patient Outreach (Signed)
[facility]) Care Management  10/12/2018  Foye DeerJanice B Kimbell 16-Dec-1940 [id number]    RN attempted outreach call today however unsuccessful. RN able to leave a HIPAA approved voice message requesting a call back. Note outreach letter has been sent with no response. Will close case per policy if no response and outreach to involved team on any request.  Elliot CousinLisa Arma Reining, RN Care Management Coordinator [facility] Main Office [phone]

## 2018-10-13 ENCOUNTER — Other Ambulatory Visit: Payer: Self-pay | Admitting: Pharmacist

## 2018-10-13 ENCOUNTER — Telehealth: Payer: Self-pay | Admitting: Physician Assistant

## 2018-10-13 NOTE — Telephone Encounter (Signed)
Copied from CRM [id number]. Topic: Quick Communication - See Telephone Encounter >> Oct 13, 2018  1:21 PM Windy KalataMichael, Amarius Toto L, NT wrote: CRM for notification. See Telephone encounter for: 10/13/18.  Patient states she used to be on amphetamine-dextroamphetamine (ADDERALL) 20 MG tablet before she had her stroke and then she was taken off of this mediation. Patient states she was not put on anything in place of this. She would like to know if she can get put back on this and just take 20mg  instead of 2 20mg  tablets a day or if something can be called in place of this.  Please advise.  [facility] [address] - [place], [place] - [address] [place] [address] [place] [id number] Phone: [phone] Fax: [phone]

## 2018-10-13 NOTE — Patient Outreach (Signed)
[facility]) Care Management  [facility]  10/13/2018  Foye DeerJanice B Kirst Apr 10, 1941 [id number]   Reason for referral: medication assistance   Unsuccessful telephone call attempt #2 to patient.   HIPAA compliant voicemail left requesting a return call  Plan:  I will make another outreach attempt to patient within 3-4 business days   Kieth BrightlyJulie Dattero Layloni Fahrner, PharmD, [facility] Clinical Pharmacist [facility]  [phone]

## 2018-10-14 ENCOUNTER — Other Ambulatory Visit: Payer: Self-pay | Admitting: Licensed Clinical Social Worker

## 2018-10-14 ENCOUNTER — Other Ambulatory Visit: Payer: Self-pay

## 2018-10-14 DIAGNOSIS — M170 Bilateral primary osteoarthritis of knee: Secondary | ICD-10-CM | POA: Diagnosis not present

## 2018-10-14 DIAGNOSIS — M25562 Pain in left knee: Secondary | ICD-10-CM | POA: Diagnosis not present

## 2018-10-14 DIAGNOSIS — M25561 Pain in right knee: Secondary | ICD-10-CM | POA: Diagnosis not present

## 2018-10-14 NOTE — Patient Outreach (Signed)
[facility](THN) Care Management  10/14/2018  Foye DeerJanice B Mohammed Nov 06, 1941 [id number]   Follow up call to patient's spouse, Olen CordialDan Santelli, to ensure receipt of resources mailed on 10/07/18.  [name] confirmed receipt and denied having any questions at this time.  BSW informed him that [facility] Pharmacist and RNCM have been attempting to contact.  He reported that Mrs. Febles sometimes has difficulty getting to her phone in time and requested a call at his mobile number listed in Epic.   BSW sent in-basket message to team informing them of this.  BSW is closing case at this time but will get involved again if additional social work needs arise. CSW, [name] [name], still currently involved.    Malachy ChamberAmber Charmian Forbis, BSW Social Worker [phone]

## 2018-10-14 NOTE — Patient Outreach (Addendum)
[facility](THN) Care Management  10/14/2018  [name] 29-Jul-1941 [id number]  [facility] completed outreach call to patient's spouse and was able to successfully reach him. HIPPA verifications received. Spouse reports successfully receiving mental health resources that were sent in the mail by [facility]. Spouse was provided a final education on resources and he denied any further questions or social work needs at this time. [facility] CSW will close case and sign off but is happy to get back involved if needed. Spouse advised that [facility] RN[facility] and Pharmacist have been unable to contact patient. Spouse wishes for all future CM calls to be made to him instead of spouse. This has been communicated to involved [facility] staff by BSW.   [name], BSW, MSW, LCSW [facility] Care Management [name] Phone: [phone] Fax: [phone]

## 2018-10-15 NOTE — Telephone Encounter (Signed)
Please see note below and advise  

## 2018-10-16 ENCOUNTER — Ambulatory Visit: Payer: Self-pay | Admitting: Pharmacist

## 2018-10-16 ENCOUNTER — Other Ambulatory Visit: Payer: Self-pay | Admitting: Pharmacy Technician

## 2018-10-16 ENCOUNTER — Other Ambulatory Visit: Payer: Self-pay | Admitting: Pharmacist

## 2018-10-16 ENCOUNTER — Other Ambulatory Visit: Payer: Self-pay | Admitting: Cardiovascular Disease

## 2018-10-16 ENCOUNTER — Other Ambulatory Visit: Payer: Self-pay | Admitting: Physician Assistant

## 2018-10-16 DIAGNOSIS — I10 Essential (primary) hypertension: Secondary | ICD-10-CM

## 2018-10-16 MED ORDER — ATORVASTATIN CALCIUM 40 MG PO TABS: 40.0000 mg | ORAL_TABLET | Freq: Every day | ORAL | 1 refills | Status: DC

## 2018-10-16 NOTE — Patient Outreach (Signed)
[facility](THN) Care Management  10/16/2018  [name] [birth year] [id number]   Received Alver [employer] patient assistance referral from [facility] Vanice SarahJulie Pruitt for Eliquis. Prepared patient portion to be mailed and faxed provider portion to Dr. Dala DockMcVey.  Will follow up with patient in 7-10 business days (due to holiday) to confirm application has been received.  [name], CPhT Certified Pharmacy Technician [facility] Care Management Direct Dial:[phone]

## 2018-10-16 NOTE — Telephone Encounter (Signed)
Copied from CRM [phone]. Topic: Quick Communication - Rx Refill/Question >> Oct 16, 2018  3:20 PM Floria RavelingStovall, Shana A wrote: Medication: atorvastatin (LIPITOR) 40 MG tablet [[id number]][[id number]]  Advise to contact   Has the patient contacted their pharmacy? No. (Agent: If no, request that the patient contact the pharmacy for the refill.) (Agent: If yes, when and what did the pharmacy advise?)  Preferred Pharmacy (with phone number or street name): [facility] [address] [place] [id number] [phone] (Phone)   Agent: Please be advised that RX refills may take up to 3 business days. We ask that you follow-up with your pharmacy.

## 2018-10-20 ENCOUNTER — Telehealth: Payer: Self-pay | Admitting: Physician Assistant

## 2018-10-20 NOTE — Telephone Encounter (Signed)
LVM for pt to call the office and schedule a F/U per McVey WITH DR. SHAW . I advised that Dr. Alver FisherShaw's earliest appt at this time is January 6th. If pt calls back, please schedule with Clelia CroftShaw at her convenience. Thank you!

## 2018-10-21 ENCOUNTER — Telehealth: Payer: Self-pay

## 2018-10-21 ENCOUNTER — Other Ambulatory Visit: Payer: Self-pay | Admitting: *Deleted

## 2018-10-21 DIAGNOSIS — I4891 Unspecified atrial fibrillation: Secondary | ICD-10-CM

## 2018-10-21 MED ORDER — APIXABAN 5 MG PO TABS: 5.0000 mg | ORAL_TABLET | Freq: Two times a day (BID) | ORAL | 3 refills | Status: DC

## 2018-10-21 NOTE — Telephone Encounter (Signed)
Pt assistance forms given to pt's husband at his office visit w/ Dr C. Pt's husband advised to return them to the office to fax them to the company.

## 2018-10-21 NOTE — Patient Outreach (Addendum)
[facility](THN) Care Management  10/21/2018  [name] 1941/06/21 [id number]    RN attempted outreach call today however unsuccessful with contact number to pt's spouse Jesusita OkaDan. RN able to leave a HIPAA approved voice message requesting a call back. Based upon the unsuccessful contact case will be closed for this discipline and team will be notified with no response to outreach letter or calls.   Elliot CousinLisa Matthews, RN Care Management Coordinator [facility] Main Office [phone]

## 2018-10-23 NOTE — Patient Outreach (Signed)
[facility]) Care Management  [place]   10/23/2018  ODA PLACKE [birth year] [id number]  Reason for referral: medication assistance  Referral source: [facility] RNCM Referral medication(s): apixaban Current insurance: [employer]  PMHx: HTN, CVA, Afib, T2DM, hypothyroidism, HLD, anxiety  HPI: 77 yo female referred for medication assistance and management. Patient unable to speak today, however successful outreach to patient's spouse completed with HIPAA identifiers verified x2.  Spouse states wife is unable to speak about medications telephonically for extended periods of time due to past stroke.  He states that they are paying >$100/month for patient's apixaban.  Due to PMH, patient remains on anticoagulation along with aspirin.  All other medications are affordable.   Objective: Allergies  Allergen Reactions  . Azithromycin Nausea And Vomiting  . Cardizem [Diltiazem Hcl] Nausea And Vomiting  . Codeine Other (See Comments)    Feeling weird   . Coreg [Carvedilol] Nausea And Vomiting  . Demerol [Meperidine] Other (See Comments)    Per pt disoriented  . Erythromycin Nausea And Vomiting    Very sick  . Inderal [Propranolol] Other (See Comments)    Made me cry  . Procardia [Nifedipine] Other (See Comments)    Disoriented, sick  . Wellbutrin [Bupropion] Other (See Comments)    Unknown reaction  . Zoloft [Sertraline Hcl] Other (See Comments)    Could not talk, stiff    Medications Reviewed Today    Reviewed by Teresita Madura (Technician) on 09/30/18 at [place] List Status: <None>  Medication Order Taking? Sig Documenting Provider Last Dose Status Informant  ACCU-CHEK SOFTCLIX LANCETS lancets [id number] Yes USE TO TEST FOUR TIMES DAILY McVey, Gelene Mink, PA-C Taking Active Spouse/Significant Other  Alfalfa 250 MG TABS [id number] Yes Take 1 tablet by mouth every morning. [provider] Taking Active Spouse/Significant Other  apixaban  (ELIQUIS) 5 MG TABS tablet [id number] Yes Take 1 tablet (5 mg total) by mouth 2 (two) times daily. McVey, Gelene Mink, PA-C Taking Active   aspirin EC 81 MG EC tablet [id number] Yes Take 1 tablet (81 mg total) by mouth daily. Modena Jansky, MD Taking Active Spouse/Significant Other  atorvastatin (LIPITOR) 40 MG tablet [id number] Yes Take 1 tablet (40 mg total) by mouth daily at 6 PM. McVey, Gelene Mink, PA-C Taking Active   blood glucose meter kit and supplies [id number] Yes Dispense based on patient and insurance preference. Use up to four times daily as directed. (FOR ICD-9 250.00, 250.01). Oswald Hillock, MD Taking Active Spouse/Significant Other  Cholecalciferol (VITAMIN D3) 5000 units TBDP [id number] Yes Take 1 capsule by mouth daily. [provider] Taking Active Spouse/Significant Other  CHROMIUM ASPARTATE PO [id number] Yes Take 1 tablet by mouth every morning. [provider] Taking Active Spouse/Significant Other  Cinnamon [place] POWD [id number] Yes Take 1 Dose by mouth daily. [provider] Taking Active Spouse/Significant Other  DHA-EPA-Coenzyme Q10-Vitamin E (GNP COQ-10 & FISH OIL) 120-180-50-30 CAPS [id number] Yes Take 3 tablets by mouth daily.  [provider] Taking Active Spouse/Significant Other           Med Note Nicki Reaper, MELISSA A   Fri Jan 09, 2018 12:39 PM)    diazepam (VALIUM) 5 MG tablet [id number] Yes TAKE 1 TABLET BY MOUTH EVERY 12 HOURS AS NEEDED FOR ANXIETY McVey, Gelene Mink, PA-C Taking Active   erythromycin ophthalmic ointment [id number] Yes Place 1 application into the right eye 2 (two) times daily. [provider] Taking Active Spouse/Significant Other  Med Note Nash Mantis, TIFFANI S   Tue May 19, 2018  3:13 PM) This is listed as an allergy but the patient has been using it at home with no issues.  fexofenadine (ALLEGRA) 180 MG tablet [id number] Yes Take 180 mg by mouth daily as needed for allergies.  [provider] Taking Active Spouse/Significant Other  fluorometholone (FML) 0.1 % ophthalmic suspension [id number] Yes Place 1 drop into the right eye 4 (four) times daily. [provider] Taking Active Spouse/Significant Other  glucose blood (ACCU-CHEK AVIVA PLUS) test strip [id number] Yes USE TO TEST FOUR TIMES DAILY McVey, Gelene Mink, PA-C Taking Active Spouse/Significant Other  hydrochlorothiazide (HYDRODIURIL) 25 MG tablet [id number] Yes Take 1 tablet (25 mg total) by mouth daily. McVey, Gelene Mink, PA-C Taking Active   HYDROcodone-acetaminophen [facility]) 10-325 MG per tablet [id number] Yes Take 1-2 tablets by mouth every 4 (four) hours as needed for moderate pain.  [provider] Taking Active Spouse/Significant Other           Med Note Nash Mantis, TIFFANI S   Tue May 19, 2018  3:02 PM)    Insulin Glargine (LANTUS SOLOSTAR) 100 UNIT/ML Solostar Pen [id number] Yes Inject 24 Units into the skin daily at 10 pm. McVey, Gelene Mink, PA-C Taking Active Spouse/Significant Other  Insulin Syringe-Needle U-100 (INSULIN SYRINGE .5CC/31GX5/16") 31G X 5/16" 0.5 ML MISC [id number] Yes Use as directed Oswald Hillock, MD Taking Active Spouse/Significant Other  levothyroxine (SYNTHROID, LEVOTHROID) 50 MCG tablet [id number] Yes Take 1 tablet (50 mcg total) by mouth daily before breakfast. McVey, Gelene Mink, PA-C Taking Active   lisinopril (PRINIVIL,ZESTRIL) 20 MG tablet [id number] Yes TAKE 1 TABLET BY MOUTH IN THE MORNING AND 1/2 (ONE-HALF) AT BEDTIME PLEASE SCHEDULE AN APPT FOR FUTURE REFILLS  Patient taking differently:  Take 10-20 mg by mouth See admin instructions. TAKE 1 TABLET BY MOUTH IN THE MORNING AND 1/2 (ONE-HALF) AT BEDTIME PLEASE SCHEDULE AN APPT FOR FUTURE REFILLS   Croitoru, Mihai, MD Taking Active Spouse/Significant Other  magnesium oxide (MAG-OX) 400 MG tablet [id number] Yes Take 400 mg by mouth 2 (two) times daily. [provider] Taking Active  Spouse/Significant Other  metoprolol tartrate (LOPRESSOR) 50 MG tablet [id number] Yes Take 1 tablet (50 mg total) by mouth 2 (two) times daily. McVey, Gelene Mink, PA-C Taking Active   Misc. Devices ([place]) [place] [id number] Yes 1 Units by Does not apply route daily as needed. McVey, Gelene Mink, PA-C Taking Active Spouse/Significant Other  potassium chloride (K-DUR) 10 MEQ tablet [id number] Yes Take 10 mEq by mouth daily. [provider] Taking Active Spouse/Significant Other  venlafaxine (EFFEXOR) 100 MG tablet [id number] Yes TAKE 1 TABLET BY MOUTH ONCE DAILY McVey, Gelene Mink, PA-C Taking Active           Assessment:  Drugs sorted by system:  Neurologic/Psychologic: diazepam, venlafaxine  Cardiovascular: apixaban, aspirin, atorvastatin, HCTZ, lisinopril, metoprolol  Pulmonary/Allergy: fexofenadine  Endocrine: Lantus, levothyroxine, metformin  Topical: erythromycin ophthalmic ointment, fluorometholone ophthalmic  Vitamins/Minerals/Supplements: alfalfa, vit D3, chromium, DHA/EPA/CoQ10, potassium, magnesium  Medication Review Findings:  . Use of Direct Factor Xa Inhibitors with Salicylates may increase the risk of bleeding. Counseled patient's spouse on signs and symptoms of bleeding.    Medication Assistance Findings:  Extra Help:   _0  Already receiving Full Extra Help  _1  Already receiving Partial Extra Help  _2  Eligible based on reported income and assets  _3  Not Eligible based on reported income and assets  Patient Assistance Programs: 1) Eliquis  (apixaban)  made by BMS o Income requirement met: _0  Yes _1  No _2  Unknown o Out-of-pocket prescription expenditure met:    _3  Yes _4  No  _5  Unknown  <BLTGAIDKSMMOCARE>_6<\/JEADGNPHQNETUYWS>_3  Not applicable - Patient to retrieve OOP from pharmacy  Plan: -I will route patient assistance letter to [place] technician who will coordinate patient assistance program application process for medications listed above.  [facility]  pharmacy technician will assist with obtaining all required documents from both patient and provider and submit application once completed.   -I will follow up with patient within 2 weeks to schedule [place] home visit as necessary   Regina Eck, PharmD, [place]  [phone]

## 2018-10-26 ENCOUNTER — Other Ambulatory Visit: Payer: Self-pay | Admitting: Pharmacy Technician

## 2018-10-26 NOTE — Patient Outreach (Signed)
[facility](THN) Care Management  10/26/2018  Foye DeerJanice B Cuyler Oct 04, 1941 [id number]   Via In-basket conversation Dr. Erin Hearingroitoru's nurse Marylene LandAngela verifies she has also given patient application for Eliquis patient assistance and will follow up with patient thru process.  Will route note to [facility] Vanice SarahJulie Pruitt for case closure.  [name], CPhT Certified Pharmacy Technician [facility] Care Management Direct [phone]

## 2018-10-29 ENCOUNTER — Ambulatory Visit: Payer: Self-pay | Admitting: Pharmacist

## 2018-10-30 ENCOUNTER — Other Ambulatory Visit: Payer: Self-pay | Admitting: Pharmacist

## 2018-10-30 ENCOUNTER — Ambulatory Visit: Payer: Self-pay | Admitting: Pharmacist

## 2018-10-30 NOTE — Patient Outreach (Addendum)
[facility]N) Care Management  [facility]  10/30/2018  Foye DeerJanice B Knoch Jul 14, 1941 [id number]   Reason for referral: medication management (Of note, no longer assisting with PAP for Eliquis-Cardiology following.)  Unsuccessful telephone call attempt to patient (patient's spouse).   HIPAA compliant voicemail left requesting a return call  Plan:  I will make another outreach attempt to patient next month.  Attempting to schedule home visit to provide medication education to stroke patient who is unable to successfully complete telephonic evaluation.  Kieth BrightlyJulie Dattero Lido Maske, PharmD, [facility] Clinical Pharmacist [facility]  [phone]

## 2018-11-02 ENCOUNTER — Other Ambulatory Visit: Payer: Self-pay | Admitting: Physician Assistant

## 2018-11-02 ENCOUNTER — Other Ambulatory Visit: Payer: Self-pay | Admitting: Cardiovascular Disease

## 2018-11-02 DIAGNOSIS — I10 Essential (primary) hypertension: Secondary | ICD-10-CM

## 2018-11-03 ENCOUNTER — Ambulatory Visit: Payer: Self-pay | Admitting: Pharmacist

## 2018-11-03 ENCOUNTER — Other Ambulatory Visit: Payer: Self-pay | Admitting: *Deleted

## 2018-11-03 DIAGNOSIS — I10 Essential (primary) hypertension: Secondary | ICD-10-CM

## 2018-11-03 MED ORDER — LISINOPRIL 20 MG PO TABS: ORAL_TABLET | ORAL | 0 refills | Status: DC

## 2018-11-03 NOTE — Telephone Encounter (Signed)
Patient's husband calling to get a refill a refill on patient's Lisinopril. Patient has not bee seen since 06/2015. I let pt's husband know if Dr. Royann Shiversroitoru is ok with me refilling her medication I will but since we have not seen her since 2016 we have to get his permission first. Pt's ask for a year's refill if possible.  I will forward this message to Dr. Salena Saner and Dario Guardianhelley.

## 2018-11-04 ENCOUNTER — Ambulatory Visit: Payer: Self-pay | Admitting: Pharmacist

## 2018-11-04 NOTE — Telephone Encounter (Signed)
Requested Prescriptions  Pending Prescriptions Disp Refills  . hydrochlorothiazide (HYDRODIURIL) 25 MG tablet [Pharmacy Med Name: HYDROCHLOROT 25MG    TAB] 90 tablet 0    Sig: TAKE 1 TABLET BY MOUTH ONCE DAILY     Cardiovascular: Diuretics - Thiazide Passed - 11/02/2018 11:44 AM      Passed - Ca in normal range and within 360 days    Calcium  Date Value Ref Range Status  07/13/2018 9.5 8.9 - 10.3 mg/dL Final         Passed - Cr in normal range and within 360 days    Creat  Date Value Ref Range Status  07/19/2013 0.85 0.50 - 1.10 mg/dL Final   Creatinine, Ser  Date Value Ref Range Status  07/13/2018 0.88 0.44 - 1.00 mg/dL Final         Passed - K in normal range and within 360 days    Potassium  Date Value Ref Range Status  07/13/2018 4.4 3.5 - 5.1 mmol/L Final         Passed - Na in normal range and within 360 days    Sodium  Date Value Ref Range Status  07/13/2018 137 135 - 145 mmol/L Final  03/25/2018 139 134 - 144 mmol/L Final         Passed - Last BP in normal range    BP Readings from Last 1 Encounters:  09/30/18 110/78         Passed - Valid encounter within last 6 months    Recent Outpatient Visits          1 month ago Type 2 diabetes mellitus with other specified complication, without long-term current use of insulin (HCC)   [facility] Mc[facility]ey, Madelaine BhatElizabeth Whitney, PA-C   6 months ago Adjustment disorder with mixed anxiety and depressed mood   [facility] Mc[facility]ey, [place], PA-C   7 months ago Adjustment disorder with mixed anxiety and depressed mood   [facility] Mc[facility]ey, [place], PA-C   7 months ago Type 2 diabetes mellitus with complication, with long-term current use of insulin [facility](HCC)   [facility] Mc[facility]ey, Madelaine BhatElizabeth Whitney, PA-C   9 months ago Anxiety   [facility] McVey, [place], [place]

## 2018-11-09 ENCOUNTER — Other Ambulatory Visit: Payer: Self-pay

## 2018-11-09 ENCOUNTER — Telehealth: Payer: Self-pay | Admitting: Family Medicine

## 2018-11-09 MED ORDER — POTASSIUM CHLORIDE ER 10 MEQ PO TBCR: 10.0000 meq | EXTENDED_RELEASE_TABLET | Freq: Every day | ORAL | 0 refills | Status: DC

## 2018-11-09 NOTE — Telephone Encounter (Signed)
Copied from CRM [phone]. Topic: Quick Communication - See Telephone Encounter >> Nov 09, 2018  4:09 PM Fanny BienIlderton, Jessica L wrote: CRM for notification. See Telephone encounter for: 11/09/18. Pt husband called and stated that pt needs home health and would like to know if pt needs to be seen. Please advise

## 2018-11-10 ENCOUNTER — Telehealth: Payer: Self-pay | Admitting: Family Medicine

## 2018-11-10 ENCOUNTER — Other Ambulatory Visit: Payer: Self-pay | Admitting: Pharmacist

## 2018-11-10 NOTE — Telephone Encounter (Signed)
Pts husband stopped by and states he understand that Dr Clelia CroftShaw is taking Mcvey pts and he needs to speak To Dr Clelia CroftShaw about his wifes Current health?   Best phone  Is [phone]

## 2018-11-10 NOTE — Telephone Encounter (Signed)
Patient was advised she will need an appointment

## 2018-11-10 NOTE — Patient Outreach (Signed)
[facility](THN) Care Management  11/10/2018  [name] August 18, 1941 [id number]   Reason for referral: medication management (Of note, [facility] no [facility] assisting with PAP for Eliquis-[facility] Card[facility] following.)  Successful call to patient's husband Jesusita Oka([name]).  Cancelled f/u appt that was scheduled for 11/11/18 due to another appt conflict.  He requested I call to schedule a f/u appt after the holidays.    PLAN: -I will f/u with patient in January   Kieth BrightlyJulie Dattero Mazy Culton, PharmD, [facility] Clinical Pharmacist [facility]  [phone]

## 2018-11-10 NOTE — Telephone Encounter (Signed)
Copied from CRM [id number]. Topic: Quick Communication - See Telephone Encounter >> Nov 10, 2018 11:26 AM Stephannie LiSimmons, Caleigh Rabelo L, NT wrote: CRM for notification. See Telephone encounter for: 11/10/18. Patients husband called and states someone called about an appointment for 11/11/18 , he would like to reschedule as soon as possible  please call [phone]

## 2018-11-11 ENCOUNTER — Ambulatory Visit: Payer: Self-pay | Admitting: Pharmacist

## 2018-11-11 ENCOUNTER — Other Ambulatory Visit: Payer: Self-pay

## 2018-11-11 ENCOUNTER — Other Ambulatory Visit: Payer: Self-pay | Admitting: Physician Assistant

## 2018-11-11 DIAGNOSIS — E118 Type 2 diabetes mellitus with unspecified complications: Secondary | ICD-10-CM

## 2018-11-11 MED ORDER — INSULIN GLARGINE 100 UNIT/ML SOLOSTAR PEN: 24.0000 [IU] | PEN_INJECTOR | Freq: Every day | SUBCUTANEOUS | 99 refills | Status: DC

## 2018-11-11 NOTE — Telephone Encounter (Signed)
Requested medication (s) are due for refill today -yes  Requested medication (s) are on the active medication list -yes  Future visit scheduled -yes  Last refill: 10/24/17 prn RF  Notes to clinic: Patient is requesting refill of expired Rx- sent for provider review and refill.   Requested Prescriptions  Pending Prescriptions Disp Refills   Insulin Glargine (LANTUS SOLOSTAR) 100 UNIT/ML Solostar Pen 5 pen PRN    Sig: Inject 24 Units into the skin daily at 10 pm.     Endocrinology:  Diabetes - Insulins Failed - 11/11/2018 11:39 AM      Failed - HBA1C is between 0 and 7.9 and within 180 days    Hemoglobin A1C  Date Value Ref Range Status  09/30/2018 10.6 (A) 4.0 - 5.6 % Final   Hgb A1c MFr Bld  Date Value Ref Range Status  05/19/2018 9.3 (H) 4.8 - 5.6 % Final    Comment:    (NOTE) Pre diabetes:          5.7%-6.4% Diabetes:              >6.4% Glycemic control for   <7.0% adults with diabetes          Passed - Valid encounter within last 6 months    Recent Outpatient Visits          1 month ago Type 2 diabetes mellitus with other specified complication, without long-term current use of insulin (HCC)   [facility], Madelaine BhatElizabeth Whitney, PA-C   6 months ago Adjustment disorder with mixed anxiety and depressed mood   [facility] Mc[facility], Madelaine BhatElizabeth Whitney, PA-C   7 months ago Adjustment disorder with mixed anxiety and depressed mood   [facility], Madelaine BhatElizabeth Whitney, PA-C   7 months ago Type 2 diabetes mellitus with complication, with long-term current use of insulin [facility](HCC)   [facility], Madelaine BhatElizabeth Whitney, PA-C   9 months ago Anxiety   [facility], Madelaine BhatElizabeth Whitney, PA-C      Future Appointments            In 4 weeks Sherren MochaShaw, Eva N, MD [facility]            Requested Prescriptions  Pending Prescriptions Disp Refills   Insulin Glargine (LANTUS SOLOSTAR) 100 UNIT/ML Solostar Pen  5 pen PRN    Sig: Inject 24 Units into the skin daily at 10 pm.     Endocrinology:  Diabetes - Insulins Failed - 11/11/2018 11:39 AM      Failed - HBA1C is between 0 and 7.9 and within 180 days    Hemoglobin A1C  Date Value Ref Range Status  09/30/2018 10.6 (A) 4.0 - 5.6 % Final   Hgb A1c MFr Bld  Date Value Ref Range Status  05/19/2018 9.3 (H) 4.8 - 5.6 % Final    Comment:    (NOTE) Pre diabetes:          5.7%-6.4% Diabetes:              >6.4% Glycemic control for   <7.0% adults with diabetes          Passed - Valid encounter within last 6 months    Recent Outpatient Visits          1 month ago Type 2 diabetes mellitus with other specified complication, without long-term current use of insulin [facility](HCC)   [facility] Mc[facility]ey, [place], PA-C   6 months ago Adjustment disorder with mixed anxiety and depressed mood   Primary Care at [facility], Madelaine Bhat, PA-C   7 months ago Adjustment disorder with mixed anxiety and depressed mood   Primary Care at [facility], Madelaine Bhat, PA-C   7 months ago Type 2 diabetes mellitus with complication, with long-term current use of insulin [facility])   [facility] at [facility], Madelaine Bhat, PA-C   9 months ago Anxiety   Primary Care at [facility], Madelaine Bhat, PA-C      Future Appointments            In 4 weeks Clelia Croft, Levell July, MD Primary Care at [place], [facility]

## 2018-11-11 NOTE — Telephone Encounter (Signed)
Husband arrived to clinic re refill of insulin. Pt will be out of insulin in next few days.  Reviewed chart.  Appt scheduled with Clelia CroftShaw to establish.  Refilled insulin.  Discussed husband's message re he wants to review wife's condition with provider.  Husband states his wife has multiple ongoing issues and physical challenges.  They have to take it day by day and many times his wife has to cancel appt d/t being physically unable to go.  Just wants to make sure Dr.  Clelia CroftShaw will have wife's info.  Advised it will be in chart and that Dr. Alver FisherShaw's assistant is also familiar with pt.  Husband denies further questions at this time.

## 2018-11-11 NOTE — Telephone Encounter (Signed)
Copied from CRM [id number]. Topic: Quick Communication - Rx Refill/Question >> Nov 11, 2018 11:28 AM Maia Pettiesrtiz, Kristie S wrote: Medication: lantus - pt is needing refill and states the pharmacy has been contacting the office with no response - pt gets 5 pens at a time (1 box)  Has the patient contacted their pharmacy? Yes - multiple attempts have been made per pt/pharmacy Preferred Pharmacy (with phone number or street name): [facility] [address] - [place], [place] - [id number] [phone] (Phone) [phone] (Fax)

## 2018-11-12 NOTE — Telephone Encounter (Signed)
Please advise patient has an up coming appt with you

## 2018-11-13 ENCOUNTER — Encounter: Payer: Self-pay | Admitting: Emergency Medicine

## 2018-11-13 ENCOUNTER — Ambulatory Visit (INDEPENDENT_AMBULATORY_CARE_PROVIDER_SITE_OTHER): Payer: Medicare Other | Admitting: Emergency Medicine

## 2018-11-13 VITALS — BP 120/80 | HR 84 | Temp 97.9°F | Resp 16

## 2018-11-13 DIAGNOSIS — E1165 Type 2 diabetes mellitus with hyperglycemia: Secondary | ICD-10-CM

## 2018-11-13 DIAGNOSIS — R112 Nausea with vomiting, unspecified: Secondary | ICD-10-CM | POA: Diagnosis not present

## 2018-11-13 DIAGNOSIS — I6930 Unspecified sequelae of cerebral infarction: Secondary | ICD-10-CM

## 2018-11-13 DIAGNOSIS — I4891 Unspecified atrial fibrillation: Secondary | ICD-10-CM | POA: Diagnosis not present

## 2018-11-13 DIAGNOSIS — E860 Dehydration: Secondary | ICD-10-CM

## 2018-11-13 LAB — POCT GLYCOSYLATED HEMOGLOBIN (HGB A1C): Hemoglobin A1C: 8.4 % — AB (ref 4.0–5.6)

## 2018-11-13 LAB — GLUCOSE, POCT (MANUAL RESULT ENTRY): POC Glucose: 190 mg/dl — AB (ref 70–99)
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
## 2018-11-13 NOTE — Patient Instructions (Addendum)
  Must go to the emergency room now for further evaluation and treatment.   If you have lab work done today you will be contacted with your lab results within the next 2 weeks.  If you have not heard from us then please contact us. The fastest way to get your results is to register for My Chart.   IF you received an x-ray today, you will receive an invoice from [facility]. Please contact [facility] at [phone] with questions or concerns regarding your invoice.   IF you received labwork today, you will receive an invoice from [facility]. Please contact [facility] at [phone] with questions or concerns regarding your invoice.   Our billing staff will not be able to assist you with questions regarding bills from these companies.  You will be contacted with the lab results as soon as they are available. The fastest way to get your results is to activate your My Chart account. Instructions are located on the last page of this paperwork. If you have not heard from us regarding the results in 2 weeks, please contact this office.

## 2018-11-14 NOTE — Telephone Encounter (Signed)
Chanda sent a years worth of refills on pt's Lantus into pharmacy [facility]  [address] [place], [place], [place] on 11/11/18

## 2018-11-16 NOTE — Telephone Encounter (Signed)
Medication rx was received the 18th of Dec, however it did take a while for stocking. Medication has not been received by pt's husband per pharmacy. (Lantus)  Thanks, Peterson AoMei

## 2018-12-07 ENCOUNTER — Telehealth: Payer: Self-pay | Admitting: Family Medicine

## 2018-12-07 NOTE — Telephone Encounter (Signed)
Called and spoke with pt's husband regarding the cancellation of their appt with Dr. Clelia CroftShaw 1.14.20. Unfortunately, Dr. Clelia CroftShaw is out of the office with an injury for the next 6 weeks. Best case scenario, she will be returning the first week of March. I advised husband to have pt call the office and either reschedule with a different provider or if it could wait until Dr. Clelia CroftShaw returns, then make an appt for the first/mid March. Husband acknowledged and will have pt call the office.

## 2018-12-08 DIAGNOSIS — Z79891 Long term (current) use of opiate analgesic: Secondary | ICD-10-CM | POA: Diagnosis not present

## 2018-12-09 ENCOUNTER — Ambulatory Visit: Payer: Medicare Other | Admitting: Family Medicine

## 2018-12-10 ENCOUNTER — Other Ambulatory Visit: Payer: Self-pay | Admitting: Physician Assistant

## 2018-12-10 DIAGNOSIS — F411 Generalized anxiety disorder: Secondary | ICD-10-CM

## 2018-12-10 NOTE — Telephone Encounter (Signed)
Requested medication (s) are due for refill today: yes  Requested medication (s) are on the active medication list: yes  Last refill:  08/03/18  Future visit scheduled: yes  Notes to clinic:  Medication not delegated for NT to refill   Requested Prescriptions  Pending Prescriptions Disp Refills   diazepam (VALIUM) 5 MG tablet [Pharmacy Med Name: diazePAM 5 MG Oral Tablet] 60 tablet 0    Sig: TAKE 1 TABLET BY MOUTH EVERY 12 HOURS AS NEEDED FOR ANXIETY     Not Delegated - Psychiatry:  Anxiolytics/Hypnotics Failed - 12/10/2018 10:27 AM      Failed - This refill cannot be delegated      Passed - Urine Drug Screen completed in last 360 days.      Passed - Valid encounter within last 6 months    Recent Outpatient Visits          3 weeks ago Uncontrolled type 2 diabetes mellitus with hyperglycemia [facility])   Primary Care at [facility], [place], MD   2 months ago Type 2 diabetes mellitus with other specified complication, without long-term current use of insulin [facility])   Primary Care at [facility], Madelaine Bhat, PA-C   7 months ago Adjustment disorder with mixed anxiety and depressed mood   Primary Care at [facility], Madelaine Bhat, PA-C   8 months ago Adjustment disorder with mixed anxiety and depressed mood   Primary Care at [facility], Madelaine Bhat, PA-C   8 months ago Type 2 diabetes mellitus with complication, with long-term current use of insulin [facility])   Primary Care at [facility], Madelaine Bhat, PA-C      Future Appointments            In 3 weeks Sagardia, Eilleen Kempf, MD Primary Care at [place], [facility]

## 2018-12-10 NOTE — Telephone Encounter (Signed)
Please advise patient has an upcoming appt with you 12/31/2018

## 2018-12-15 ENCOUNTER — Telehealth (HOSPITAL_COMMUNITY): Payer: Self-pay | Admitting: Psychiatry

## 2018-12-15 NOTE — Telephone Encounter (Signed)
D:  Dr. Hulen Shouts referred pt to [facility].  A:  Placed call to pt; but there was no answer.  Left phone number for pt to call writer back if interested in group.

## 2018-12-25 ENCOUNTER — Other Ambulatory Visit: Payer: Self-pay | Admitting: Pharmacist

## 2018-12-25 ENCOUNTER — Ambulatory Visit: Payer: Self-pay | Admitting: Pharmacist

## 2018-12-25 NOTE — Patient Outreach (Addendum)
[facility]) Care Management  [place]   12/25/2018  [name] 1941-11-24 [id number]  Reason for referral: Medication Assistance with INSULIN & ELIQUIS Current insurance:UHC/AARP  PMHx includes but not limited to:  CAD, h/o CVA, DM, Afib, CHF, hypothyroidism, depression  Outreach:  Successful telephone call with Mr. Dhriti Fales.  HIPAA identifiers verified.  He states patient is doing "okay", but she is in a lot of pain.  She sees a pain management MD who prescribes Norco that "takes the edge off".  Encouraged patient to reach out to MD see if additional non-narcotic agent would be beneficial.   Patient's husband states they never received assistance with Eliquis from [place].  [facility] CPhT followed up and stated that they are taking care of that.  [name] also states that Aashna's insulin is expensive, therefore is agreement to apply for [employer] to obtain Basaglar (insulin glargine). Her BGs remain controlled.  Her A1c has decreased from 10.6-->8.4 (Dec 2019).  She continues to take medications as prescribed.  Denies S/Sx of bleeding on Eliquis.  Objective: Lab Results  Component Value Date   CREATININE 0.88 07/13/2018   CREATININE 0.76 07/03/2018   CREATININE 1.04 (H) 07/02/2018    Lab Results  Component Value Date   HGBA1C 8.4 (A) 11/13/2018    Lipid Panel     Component Value Date/Time   CHOL 287 (H) 05/19/2018 1146   CHOL 346 (H) 09/23/2017 1803   TRIG 283 (H) 05/19/2018 1146   HDL 42 05/19/2018 1146   HDL 56 09/23/2017 1803   CHOLHDL 6.8 05/19/2018 1146   VLDL 57 (H) 05/19/2018 1146   LDLCALC 188 (H) 05/19/2018 1146   [place] Comment 09/23/2017 1803    BP Readings from Last 3 Encounters:  11/13/18 120/80  09/30/18 110/78  07/14/18 125/78    Allergies  Allergen Reactions  . Azithromycin Nausea And Vomiting  . Cardizem [Diltiazem Hcl] Nausea And Vomiting  . Codeine Other (See Comments)    Feeling weird   . Coreg  [Carvedilol] Nausea And Vomiting  . Demerol [Meperidine] Other (See Comments)    Per pt disoriented  . Erythromycin Nausea And Vomiting    Very sick  . Inderal [Propranolol] Other (See Comments)    Made me cry  . Procardia [Nifedipine] Other (See Comments)    Disoriented, sick  . Wellbutrin [Bupropion] Other (See Comments)    Unknown reaction  . Zoloft [Sertraline Hcl] Other (See Comments)    Could not talk, stiff    Medications Reviewed Today    Reviewed by Lavera Guise, [facility] (Pharmacist) on 12/25/18 at 1047  Med List Status: <None>  Medication Order Taking? Sig Documenting Provider Last Dose Status Informant  ACCU-CHEK SOFTCLIX LANCETS lancets [id number]  USE TO TEST FOUR TIMES DAILY McVey, Gelene Mink, PA-C  Active Spouse/Significant Other           Med Note Blanch Media, CYNTHIA A   Fri Nov 13, 2018  4:35 PM) Use sometimes   Alfalfa 250 MG TABS [id number]  Take 1 tablet by mouth every morning. [provider]  Active Spouse/Significant Other  apixaban (ELIQUIS) 5 MG TABS tablet [id number]  Take 1 tablet (5 mg total) by mouth 2 (two) times daily. Croitoru, Mihai, MD  Active   aspirin EC 81 MG EC tablet [id number] Yes Take 1 tablet (81 mg total) by mouth daily. Modena Jansky, MD Taking Active Spouse/Significant Other  atorvastatin (LIPITOR) 40 MG tablet [id number] Yes Take 1 tablet (  40 mg total) by mouth daily at 6 PM. McVey, Gelene Mink, PA-C Taking Active   blood glucose meter kit and supplies [id number] Yes Dispense based on patient and insurance preference. Use up to four times daily as directed. (FOR ICD-9 250.00, 250.01). Oswald Hillock, MD Taking Active Spouse/Significant Other           Med Note Blanch Media, CYNTHIA A   Fri Nov 13, 2018  4:35 PM) use  Cholecalciferol (VITAMIN D3) 5000 units TBDP [id number] Yes Take 1 capsule by mouth daily. [provider] Taking Active Spouse/Significant Other  CHROMIUM ASPARTATE PO [id number] Yes Take 1 tablet by mouth every  morning. [provider] Taking Active Spouse/Significant Other  Cinnamon [place] POWD [id number] Yes Take 1 Dose by mouth daily. [provider] Taking Active Spouse/Significant Other  DHA-EPA-Coenzyme Q10-Vitamin E (GNP COQ-10 & FISH OIL) 120-180-50-30 CAPS [id number] Yes Take 3 tablets by mouth daily.  [provider] Taking Active Spouse/Significant Other           Med Note Nicki Reaper, MELISSA A   Fri Jan 09, 2018 12:39 PM)    diazepam (VALIUM) 5 MG tablet [id number] Yes TAKE 1 TABLET BY MOUTH EVERY 12 HOURS AS NEEDED FOR ANXIETY McVey, Gelene Mink, PA-C Taking Active   erythromycin ophthalmic ointment [id number] Yes Place 1 application into the right eye 2 (two) times daily. [provider] Taking Active Spouse/Significant Other           Med Note Nash Mantis, TIFFANI S   Tue May 19, 2018  3:13 PM) This is listed as an allergy but the patient has been using it at home with no issues.  fexofenadine (ALLEGRA) 180 MG tablet [id number] Yes Take 180 mg by mouth daily as needed for allergies.  [provider] Taking Active Spouse/Significant Other  fluorometholone (FML) 0.1 % ophthalmic suspension [id number] Yes Place 1 drop into the right eye 4 (four) times daily. [provider] Taking Active Spouse/Significant Other  glucose blood (ACCU-CHEK AVIVA PLUS) test strip [id number] Yes USE TO TEST FOUR TIMES DAILY McVey, Gelene Mink, PA-C Taking Active Spouse/Significant Other           Med Note Blanch Media, CYNTHIA A   Fri Nov 13, 2018  4:35 PM) use  hydrochlorothiazide (HYDRODIURIL) 25 MG tablet [id number] Yes TAKE 1 TABLET BY MOUTH ONCE DAILY McVey, Gelene Mink, PA-C Taking Active   HYDROcodone-acetaminophen (NORCO) 10-325 MG per tablet [id number] Yes Take 1-2 tablets by mouth every 4 (four) hours as needed for moderate pain.  [provider] Taking Active Spouse/Significant Other           Med Note Nash Mantis, TIFFANI S   Tue May 19, 2018  3:02 PM)     Insulin Glargine (LANTUS SOLOSTAR) 100 UNIT/ML Solostar Pen [id number] Yes Inject 24 Units into the skin daily at 10 pm. Shawnee Knapp, MD Taking Active   Insulin Syringe-Needle U-100 (INSULIN SYRINGE .5CC/31GX5/16") 31G X 5/16" 0.5 ML MISC [id number] Yes Use as directed Oswald Hillock, MD Taking Active Spouse/Significant Other           Med Note Blanch Media, CYNTHIA A   Fri Nov 13, 2018  4:36 PM) use  levothyroxine (SYNTHROID, LEVOTHROID) 50 MCG tablet [id number] Yes Take 1 tablet (50 mcg total) by mouth daily before breakfast. McVey, Gelene Mink, PA-C Taking Active   lisinopril (PRINIVIL,ZESTRIL) 20 MG tablet [id number] Yes TAKE 1 TABLET BY MOUTH IN THE MORNING AND 1/2 (ONE-HALF) AT BEDTIME PLEASE SCHEDULE AN APPT FOR FUTURE REFILLS  Croitoru, Mihai, MD Taking Active   magnesium oxide (MAG-OX) 400 MG tablet [id number] Yes Take 400 mg by mouth 2 (two) times daily. [provider] Taking Active Spouse/Significant Other  metFORMIN (GLUCOPHAGE XR) 500 MG 24 hr tablet [id number] Yes (take with food) Week 1: take 1/2 tab twice a day; Week 2: take 1 tab in the morning, 1/2 tablet at night; Week 3: take 2 tabs twice a day. McVey, Gelene Mink, PA-C Taking Active   metoprolol tartrate (LOPRESSOR) 50 MG tablet [id number] Yes Take 1 tablet (50 mg total) by mouth 2 (two) times daily. McVey, Gelene Mink, PA-C Taking Active   Misc. Devices (Cana) [place] [id number] Yes 1 Units by Does not apply route daily as needed. McVey, Gelene Mink, PA-C Taking Active Spouse/Significant Other           Med Note Blanch Media, CYNTHIA A   Fri Nov 13, 2018  4:40 PM) use  potassium chloride (K-DUR) 10 MEQ tablet [id number] Yes Take 1 tablet (10 mEq total) by mouth daily. Forrest Moron, MD Taking Active   venlafaxine XR (EFFEXOR XR) 150 MG 24 hr capsule [id number] Yes Take 1 capsule (150 mg total) by mouth daily with breakfast. McVey, Gelene Mink, PA-C Taking Active            Assessment:  Drugs sorted by system:  Neurologic/Psychologic: diazepam, velafaxine  Cardiovascular: apixaban, ASA, atorvastatin, HCTZ, lisinopril, metoprolol  Pulmonary/Allergy: fexofenadine  Endocrine: Lantus, metformin, levothyroxine  Topical: erythromycin ophthalmic ointment, FML ophthalmic susp  Pain: hydrocodone/APAP  Vitamins/Minerals/Supplements: vitD, MagOX, potassium cinnamon, chromium, DHA/EPA/COQ10,   Miscellaneous: alfalfa  Medication Review Findings:  Drug-drug interaction: Use of Direct Factor Xa Inhibitors (apixaban) with Salicylates (ASA) may increase the risk of bleeding. Patient's husband counseled on S/Sx bleeding and denies any issues. Hemoglobin/creatinine stable upon chart review. Diabetes: A1c down 10.6-->8.4% patient continues on Lantus/metformin   Medication Assistance Findings:  Medication assistance needs identified.   Extra Help:   _0  Already receiving Full Extra Help  _1  Already receiving Partial Extra Help  _2  Eligible based on reported income and assets  _3  Not Eligible based on reported income and assets  Patient Assistance Programs: 1) Engineer, agricultural made by [place] requirement met: _4  Yes _5  No _6  Unknown o Out-of-pocket prescription expenditure met:    _7  Yes _8  No  _9  Unknown  <BDHDIXBOERQSXQKS>_0<\/SHNGITJLLVDIXVEZ>_50  Not applicable - Patient has met application requirements to apply for this patient assistance program.          2)  Eliquis made by BMS - To be followed by [place]  Additional medication assistance options reviewed with patient as warranted:  No other options identified  Plan: I will route patient assistance letter to [place] technician who will coordinate patient assistance program application process for medications listed above.  [facility] pharmacy technician will assist with obtaining all required documents from both patient and provider(s) and submit application(s) once completed.  I will follow up as needed during PAP process  Regina Eck, PharmD, [place]  [phone]

## 2018-12-28 ENCOUNTER — Other Ambulatory Visit: Payer: Self-pay | Admitting: Physician Assistant

## 2018-12-28 ENCOUNTER — Other Ambulatory Visit: Payer: Self-pay | Admitting: Pharmacy Technician

## 2018-12-28 DIAGNOSIS — I10 Essential (primary) hypertension: Secondary | ICD-10-CM

## 2018-12-28 NOTE — Telephone Encounter (Signed)
Requested Prescriptions  Pending Prescriptions Disp Refills  . metoprolol tartrate (LOPRESSOR) 50 MG tablet [Pharmacy Med Name: Metoprolol Tartrate 50 MG Oral Tablet] 60 tablet 0    Sig: TAKE 1 TABLET BY MOUTH TWICE DAILY     Cardiovascular:  Beta Blockers Passed - 12/28/2018 10:11 AM      Passed - Last BP in normal range    BP Readings from Last 1 Encounters:  11/13/18 120/80         Passed - Last Heart Rate in normal range    Pulse Readings from Last 1 Encounters:  11/13/18 84         Passed - Valid encounter within last 6 months    Recent Outpatient Visits          1 month ago Uncontrolled type 2 diabetes mellitus with hyperglycemia (HCC)   Primary Care at [facility], Eilleen Kempf, MD   2 months ago Type 2 diabetes mellitus with other specified complication, without long-term current use of insulin [facility])   Primary Care at [facility], Madelaine Bhat, PA-C   8 months ago Adjustment disorder with mixed anxiety and depressed mood   Primary Care at [facility], [place], PA-C   9 months ago Adjustment disorder with mixed anxiety and depressed mood   Primary Care at [facility], [place], PA-C   9 months ago Type 2 diabetes mellitus with complication, with long-term current use of insulin [facility])   Primary Care at [facility], Madelaine Bhat, PA-C      Future Appointments            In 3 days Sagardia, Eilleen Kempf, MD Primary Care at [place], [facility]

## 2018-12-28 NOTE — Patient Outreach (Signed)
[facility]) Care Management  12/28/2018  Kathleen DeerJanice B Williams 15-May-1941 [id number]                                                  Medication Assistance Referral  [facility] Kandra NicolasJulie P.  Medication/Company: [name] / [name] Patient application portion:  Mailed Provider application portion: Faxed  to [name] ([name])   Follow up:  Will follow up with patient in 5-7 business days to confirm application(s) have been received.  [name], CPhT Certified Pharmacy Technician [facility] Care Management Direct Dial:[phone]

## 2018-12-31 ENCOUNTER — Ambulatory Visit: Payer: Self-pay | Admitting: Emergency Medicine

## 2019-01-01 ENCOUNTER — Other Ambulatory Visit: Payer: Self-pay | Admitting: Physician Assistant

## 2019-01-01 DIAGNOSIS — F411 Generalized anxiety disorder: Secondary | ICD-10-CM

## 2019-01-01 NOTE — Telephone Encounter (Signed)
Copied from CRM [id number]. Topic: Quick Communication - Rx Refill/Question >> Jan 01, 2019  9:41 AM Gerrianne ScalePayne, Vibha Ferdig L wrote: Medication: diazepam (VALIUM) 5 MG tablet pt is out of this medicine   McVey has filled this for pt before  Has the patient contacted their pharmacy? Yes.   (Agent: If no, request that the patient contact the pharmacy for the refill.) (Agent: If yes, when and what did the pharmacy advise?)to call provider haven't heard from provider  Preferred Pharmacy (with phone number or street name):     [facility] [address] - [place], [place] - [id number] [phone] (Phone) [phone] (Fax)    Agent: Please be advised that RX refills may take up to 3 business days. We ask that you follow-up with your pharmacy.

## 2019-01-01 NOTE — Telephone Encounter (Signed)
Please advise Patient is requesting a refill of the following medications: Requested Prescriptions   Pending Prescriptions Disp Refills  . diazepam (VALIUM) 5 MG tablet 60 tablet 1    Sig: Take 1 tablet (5 mg total) by mouth every 12 (twelve) hours as needed. for anxiety

## 2019-01-06 NOTE — Telephone Encounter (Signed)
[name], pts husband, called to check status of RX for diazepam. Pt has been out for a couple of days and uses this for muscle spasms. Request was sent in 01/01/2019. Please advise.   [facility] [address], [place] - [address] [phone] (Phone) [phone] (Fax)

## 2019-01-07 ENCOUNTER — Other Ambulatory Visit: Payer: Self-pay | Admitting: Pharmacist

## 2019-01-07 ENCOUNTER — Ambulatory Visit: Payer: Medicare Other | Admitting: Pharmacist

## 2019-01-07 ENCOUNTER — Other Ambulatory Visit: Payer: Self-pay | Admitting: Pharmacy Technician

## 2019-01-07 NOTE — Patient Outreach (Signed)
[facility]) Care Management  [facility]  01/07/2019  [name] 1941/11/20 [id number]   Reason for call: medication management/assistance  Unsuccessful telephone call attempt to patient.   HIPAA compliant voicemail left requesting a return call  Plan:  I will make another outreach attempt to patient within 3-4 business days   Kieth Brightly, PharmD, [facility] Clinical Pharmacist [facility]  [phone]

## 2019-01-07 NOTE — Patient Outreach (Signed)
[facility]) Care Management  01/07/2019  BETTYJANE KULHANEK 1941/04/15 [id number]    Return call to patient husband, Jesusita Oka, HIPAA identifiers verified. Mr. Weisel stated that they were reffered to contact me about needs Mrs. Maddux was requesting for home services. Informed Mr. Silveria that I am only taking care of patient assistance for them by I can try to help. Mrs. Pineau got on call and stated she needs resources for meals and other needs. She states that since her last stroke she is finding it harder and harder to things that used to come easy to her. She states that she gets discombobulated and her words slur together more. Informed her as well that I'm working on patient assistance but I could outreach [facility] Vanice Sarah to have her contact them to discuss other needs requested so that she can make appropriate referral. Requested to speak to Mr. Kuo so that I could confirm that they have received [employer] application that was mailed out to them. He states that he thought they had. I also informed him that I would have Raynelle Fanning reach out to them to discuss needs that Mrs. Rollyson is inquiring about.  Contacted Raynelle Fanning and informed her of conversation. Will follow up with Raynelle Fanning in 2-3 business days if I have not received an update.  [name] Effie Shy CPhT Certified Pharmacy Technician [facility] Care Management Direct [phone]

## 2019-01-08 ENCOUNTER — Other Ambulatory Visit: Payer: Self-pay | Admitting: Family Medicine

## 2019-01-08 DIAGNOSIS — F411 Generalized anxiety disorder: Secondary | ICD-10-CM

## 2019-01-08 MED ORDER — DIAZEPAM 5 MG PO TABS: 5.0000 mg | ORAL_TABLET | Freq: Two times a day (BID) | ORAL | 0 refills | Status: DC | PRN

## 2019-01-11 ENCOUNTER — Ambulatory Visit: Payer: Self-pay | Admitting: Pharmacist

## 2019-01-12 ENCOUNTER — Ambulatory Visit: Payer: Self-pay | Admitting: Pharmacist

## 2019-01-13 ENCOUNTER — Other Ambulatory Visit: Payer: Self-pay | Admitting: Pharmacist

## 2019-01-15 NOTE — Patient Outreach (Addendum)
[facility]) Care Management  [facility] Pharm[facility]  01/13/2019  [name] 1941/05/21 [id number]   Reason for call: medication management/assistance  Unsuccessful telephone call attempt #2 to patient.   HIPAA compliant voicemail left requesting a return call  Plan:  I will make another outreach attempt to patient within 3-4 business days   Kieth Brightly, PharmD, [facility] Clinical Pharmacist [facility] [employer]  [phone]

## 2019-01-19 ENCOUNTER — Other Ambulatory Visit: Payer: Self-pay | Admitting: Pharmacist

## 2019-01-19 ENCOUNTER — Ambulatory Visit: Payer: Self-pay | Admitting: Pharmacist

## 2019-01-19 NOTE — Patient Outreach (Signed)
[facility]) Care Management  [facility] Pharmacy  01/19/2019  [name] January 20, 1941 [id number]   Reason for call: medication management & check on PAP status  Unsuccessful telephone call attempt to patient.   HIPAA compliant voicemail left requesting a return call  Plan:  I will make another outreach attempt to patient within 3-4 business days    Kieth Brightly, PharmD, [facility] Clinical Pharmacist [facility] [employer]  [phone]

## 2019-01-21 ENCOUNTER — Telehealth: Payer: Self-pay | Admitting: Emergency Medicine

## 2019-01-21 NOTE — Telephone Encounter (Signed)
Copied from CRM [id number]. Topic: General - Other >> Jan 21, 2019  4:44 PM Tamela Oddi wrote: Reason for CRM: Patient called to request that the doctor send her in a hydrocortisone cream that the patient said she use to take for facial rash and hairline irritation.  Informed patient that the doctor might want to see patient before prescribing.  Please advise and call patient back at [phone]

## 2019-01-25 ENCOUNTER — Ambulatory Visit: Payer: Self-pay | Admitting: Pharmacist

## 2019-01-25 ENCOUNTER — Other Ambulatory Visit: Payer: Self-pay | Admitting: Pharmacist

## 2019-01-25 NOTE — Patient Outreach (Addendum)
[facility]) Care Management  [facility] CM PHARMACY  01/25/2019  ELLIEANN SAHL 1941-03-13 [id number]  Successful patient outreach call to patient's spouse Jesusita Oka) with HIPAA identifiers verified.  He states that patient remains in the same disposition s/p stroke last year.  She is unable to complete ADL's without assistance and still has difficulties with speech, OT, etc.  He states he is "doing his best" to serve as her caregiver, but he could use help.   He states he is unable to afford home health or aids to come in the home and has already been screened by [facility] CM SW for additional support.  Encouraged patient's husband to mail back PAP forms for [name] (insulin) in order for [place] to obtain medication from the manufacturer.  This will potentially help to offset cost that he could use to hire assistance in the home.  He states he needs to go to [employer] pharmacy to pick up OOP print outs in order for assess Eliquis PAP requirements.  Encouraged patient to do so and also return PAP forms back to [place] so we can assist further.  PLAN: -I will follow up with PAP status as needed -I will route note to [facility] CPhT, Pamala Duffel, PharmD, [facility] Clinical Pharmacist [facility]  [phone]

## 2019-01-27 NOTE — Telephone Encounter (Signed)
Spoke with patient and informed her that we would need to see her and patient understood and was transferred to clerical.

## 2019-01-29 ENCOUNTER — Ambulatory Visit: Payer: Medicare Other | Admitting: Pharmacist

## 2019-01-30 ENCOUNTER — Ambulatory Visit: Payer: Medicare Other | Admitting: Emergency Medicine

## 2019-02-01 ENCOUNTER — Other Ambulatory Visit: Payer: Self-pay | Admitting: Physician Assistant

## 2019-02-01 ENCOUNTER — Other Ambulatory Visit: Payer: Self-pay | Admitting: Family Medicine

## 2019-02-01 DIAGNOSIS — I10 Essential (primary) hypertension: Secondary | ICD-10-CM

## 2019-02-03 ENCOUNTER — Telehealth: Payer: Self-pay | Admitting: Emergency Medicine

## 2019-02-03 ENCOUNTER — Ambulatory Visit: Payer: Self-pay

## 2019-02-03 NOTE — Telephone Encounter (Signed)
FYI

## 2019-02-03 NOTE — Telephone Encounter (Signed)
error 
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
## 2019-02-04 ENCOUNTER — Other Ambulatory Visit: Payer: Self-pay

## 2019-02-04 DIAGNOSIS — I10 Essential (primary) hypertension: Secondary | ICD-10-CM

## 2019-02-04 MED ORDER — LISINOPRIL 20 MG PO TABS: ORAL_TABLET | ORAL | 0 refills | Status: DC

## 2019-02-04 NOTE — Telephone Encounter (Signed)
Rx(s) sent to pharmacy electronically.  

## 2019-02-04 NOTE — Telephone Encounter (Signed)
This office has been her PCP but I do not know her very well.  I saw her once for an emergent problem.  Sounds like we are going to need to recruit home health services to evaluate her situation at home.  Thanks.

## 2019-02-10 ENCOUNTER — Telehealth: Payer: Self-pay

## 2019-02-10 NOTE — Telephone Encounter (Signed)
Left message on voicemail to return office call.  Will advise pt when she calls back that dr sagardia recommends home health services to evaluate her situation at home.  Pt has appt on 02/22/2019 and will address these concerns at that time. Dgaddy, CMA

## 2019-02-10 NOTE — Telephone Encounter (Signed)
Pt returning my call an I advised that dr sagardia recommends home health services evaluate and will address this issue at appt on 02/22/2019 at 2:20 p.m.  And pt is agreeable. Dgaddy, CMA

## 2019-02-11 ENCOUNTER — Other Ambulatory Visit: Payer: Self-pay | Admitting: Physician Assistant

## 2019-02-15 ENCOUNTER — Other Ambulatory Visit: Payer: Self-pay | Admitting: Physician Assistant

## 2019-02-15 DIAGNOSIS — I4891 Unspecified atrial fibrillation: Secondary | ICD-10-CM

## 2019-02-15 NOTE — Telephone Encounter (Signed)
78 yo, 81.3kg, scr 0.88 on 07/13/18 Last OV 07/10/15 - message sent to scheduling   Will refill for now given current COVID.

## 2019-02-15 NOTE — Telephone Encounter (Signed)
Please advise 

## 2019-02-16 ENCOUNTER — Other Ambulatory Visit: Payer: Self-pay | Admitting: Pharmacist

## 2019-02-16 ENCOUNTER — Telehealth: Payer: Self-pay | Admitting: Cardiovascular Disease

## 2019-02-16 NOTE — Patient Outreach (Signed)
[facility]) Care Management [facility] Pharmacy  02/16/2019  [name] 02-19-1941 [id number]  Reason for referral: medication assistance  [facility] pharmacy case is being closed due to the following reasons:  Requested documents have not been provided despite multiple requests (eliquis).  Unable to move forward with PAP process.  Patient has been provided [facility] CM contact information if assistance needed in the future.    Thank you for allowing [facility] pharmacy to be involved in this patient's care.    Kieth Brightly, PharmD, [facility] Clinical Pharmacist [facility]  [phone]

## 2019-02-16 NOTE — Telephone Encounter (Signed)
New message   Patient;s husband states that received a message that one of the prescriptions is out and need to come in for a visit before a refill can be made. Patient's husband would like a call to discuss.

## 2019-02-16 NOTE — Telephone Encounter (Signed)
DPR on file returned pt husband's call. lmtcb if assistance is still needed.

## 2019-02-16 NOTE — Telephone Encounter (Addendum)
Spoke with pt's husband and pt has not seen Dr Royann Shivers since 06/2015 Pt needs to check script and see who has done refills in past and call that office for future refills./cy

## 2019-02-17 ENCOUNTER — Other Ambulatory Visit: Payer: Self-pay | Admitting: *Deleted

## 2019-02-17 DIAGNOSIS — E1165 Type 2 diabetes mellitus with hyperglycemia: Secondary | ICD-10-CM

## 2019-02-17 DIAGNOSIS — I10 Essential (primary) hypertension: Secondary | ICD-10-CM

## 2019-02-22 ENCOUNTER — Encounter: Payer: Self-pay | Admitting: Emergency Medicine

## 2019-02-22 ENCOUNTER — Other Ambulatory Visit: Payer: Self-pay | Admitting: Physician Assistant

## 2019-02-22 ENCOUNTER — Encounter

## 2019-02-22 ENCOUNTER — Ambulatory Visit: Payer: Medicare Other | Admitting: Pharmacist

## 2019-02-22 ENCOUNTER — Other Ambulatory Visit: Payer: Self-pay

## 2019-02-22 ENCOUNTER — Ambulatory Visit (INDEPENDENT_AMBULATORY_CARE_PROVIDER_SITE_OTHER): Payer: Medicare Other | Admitting: Emergency Medicine

## 2019-02-22 VITALS — BP 120/74 | HR 101 | Temp 98.2°F | Resp 16

## 2019-02-22 DIAGNOSIS — Z8673 Personal history of transient ischemic attack (TIA), and cerebral infarction without residual deficits: Secondary | ICD-10-CM

## 2019-02-22 DIAGNOSIS — F411 Generalized anxiety disorder: Secondary | ICD-10-CM

## 2019-02-22 DIAGNOSIS — E118 Type 2 diabetes mellitus with unspecified complications: Secondary | ICD-10-CM

## 2019-02-22 DIAGNOSIS — Z794 Long term (current) use of insulin: Secondary | ICD-10-CM

## 2019-02-22 DIAGNOSIS — R5381 Other malaise: Secondary | ICD-10-CM | POA: Diagnosis not present

## 2019-02-22 DIAGNOSIS — I10 Essential (primary) hypertension: Secondary | ICD-10-CM

## 2019-02-22 DIAGNOSIS — G894 Chronic pain syndrome: Secondary | ICD-10-CM | POA: Diagnosis not present

## 2019-02-22 NOTE — Patient Instructions (Addendum)
   If you have lab work done today you will be contacted with your lab results within the next 2 weeks.  If you have not heard from us then please contact us. The fastest way to get your results is to register for My Chart.   IF you received an x-ray today, you will receive an invoice from [facility]. Please contact [facility] at [phone] with questions or concerns regarding your invoice.   IF you received labwork today, you will receive an invoice from [facility]. Please contact [facility] at [phone] with questions or concerns regarding your invoice.   Our billing staff will not be able to assist you with questions regarding bills from these companies.  You will be contacted with the lab results as soon as they are available. The fastest way to get your results is to activate your My Chart account. Instructions are located on the last page of this paperwork. If you have not heard from us regarding the results in 2 weeks, please contact this office.     Health Maintenance After Age 65 After age 65, you are at a higher risk for certain long-term diseases and infections as well as injuries from falls. Falls are a major cause of broken bones and head injuries in people who are older than age 65. Getting regular preventive care can help to keep you healthy and well. Preventive care includes getting regular testing and making lifestyle changes as recommended by your health care provider. Talk with your health care provider about:  Which screenings and tests you should have. A screening is a test that checks for a disease when you have no symptoms.  A diet and exercise plan that is right for you. What should I know about screenings and tests to prevent falls? Screening and testing are the best ways to find a health problem early. Early diagnosis and treatment give you the best chance of managing medical conditions that are common after age 65. Certain conditions and  lifestyle choices may make you more likely to have a fall. Your health care provider may recommend:  Regular vision checks. Poor vision and conditions such as cataracts can make you more likely to have a fall. If you wear glasses, make sure to get your prescription updated if your vision changes.  Medicine review. Work with your health care provider to regularly review all of the medicines you are taking, including over-the-counter medicines. Ask your health care provider about any side effects that may make you more likely to have a fall. Tell your health care provider if any medicines that you take make you feel dizzy or sleepy.  Osteoporosis screening. Osteoporosis is a condition that causes the bones to get weaker. This can make the bones weak and cause them to break more easily.  Blood pressure screening. Blood pressure changes and medicines to control blood pressure can make you feel dizzy.  Strength and balance checks. Your health care provider may recommend certain tests to check your strength and balance while standing, walking, or changing positions.  Foot health exam. Foot pain and numbness, as well as not wearing proper footwear, can make you more likely to have a fall.  Depression screening. You may be more likely to have a fall if you have a fear of falling, feel emotionally low, or feel unable to do activities that you used to do.  Alcohol use screening. Using too much alcohol can affect your balance and may make you more likely to   have a fall. What actions can I take to lower my risk of falls? General instructions  Talk with your health care provider about your risks for falling. Tell your health care provider if: ? You fall. Be sure to tell your health care provider about all falls, even ones that seem minor. ? You feel dizzy, sleepy, or off-balance.  Take over-the-counter and prescription medicines only as told by your health care provider. These include any  supplements.  Eat a healthy diet and maintain a healthy weight. A healthy diet includes low-fat dairy products, low-fat (lean) meats, and fiber from whole grains, beans, and lots of fruits and vegetables. Home safety  Remove any tripping hazards, such as rugs, cords, and clutter.  Install safety equipment such as grab bars in bathrooms and safety rails on stairs.  Keep rooms and walkways well-lit. Activity   Follow a regular exercise program to stay fit. This will help you maintain your balance. Ask your health care provider what types of exercise are appropriate for you.  If you need a cane or walker, use it as recommended by your health care provider.  Wear supportive shoes that have nonskid soles. Lifestyle  Do not drink alcohol if your health care provider tells you not to drink.  If you drink alcohol, limit how much you have: ? 0-1 drink a day for women. ? 0-2 drinks a day for men.  Be aware of how much alcohol is in your drink. In the [place] one drink equals one typical bottle of beer (12 oz), one-half glass of wine (5 oz), or one shot of hard liquor (1 oz).  Do not use any products that contain nicotine or tobacco, such as cigarettes and e-cigarettes. If you need help quitting, ask your health care provider. Summary  Having a healthy lifestyle and getting preventive care can help to protect your health and wellness after age 65.  Screening and testing are the best way to find a health problem early and help you avoid having a fall. Early diagnosis and treatment give you the best chance for managing medical conditions that are more common for people who are older than age 65.  Falls are a major cause of broken bones and head injuries in people who are older than age 65. Take precautions to prevent a fall at home.  Work with your health care provider to learn what changes you can make to improve your health and wellness and to prevent falls. This information is not intended  to replace advice given to you by your health care provider. Make sure you discuss any questions you have with your health care provider. Document Released: 09/24/2017 Document Revised: 09/24/2017 Document Reviewed: 09/24/2017 Elsevier Interactive Patient Education  2019 Elsevier Inc.  
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
## 2019-02-24 ENCOUNTER — Telehealth: Payer: Self-pay | Admitting: *Deleted

## 2019-02-24 ENCOUNTER — Telehealth: Payer: Self-pay | Admitting: Emergency Medicine

## 2019-02-24 DIAGNOSIS — I5032 Chronic diastolic (congestive) heart failure: Secondary | ICD-10-CM | POA: Diagnosis not present

## 2019-02-24 DIAGNOSIS — E785 Hyperlipidemia, unspecified: Secondary | ICD-10-CM | POA: Diagnosis not present

## 2019-02-24 DIAGNOSIS — M549 Dorsalgia, unspecified: Secondary | ICD-10-CM | POA: Diagnosis not present

## 2019-02-24 DIAGNOSIS — M25511 Pain in right shoulder: Secondary | ICD-10-CM | POA: Diagnosis not present

## 2019-02-24 DIAGNOSIS — Z7901 Long term (current) use of anticoagulants: Secondary | ICD-10-CM | POA: Diagnosis not present

## 2019-02-24 DIAGNOSIS — I110 Hypertensive heart disease with heart failure: Secondary | ICD-10-CM | POA: Diagnosis not present

## 2019-02-24 DIAGNOSIS — G4710 Hypersomnia, unspecified: Secondary | ICD-10-CM | POA: Diagnosis not present

## 2019-02-24 DIAGNOSIS — F329 Major depressive disorder, single episode, unspecified: Secondary | ICD-10-CM | POA: Diagnosis not present

## 2019-02-24 DIAGNOSIS — F411 Generalized anxiety disorder: Secondary | ICD-10-CM | POA: Diagnosis not present

## 2019-02-24 DIAGNOSIS — G319 Degenerative disease of nervous system, unspecified: Secondary | ICD-10-CM | POA: Diagnosis not present

## 2019-02-24 DIAGNOSIS — M47812 Spondylosis without myelopathy or radiculopathy, cervical region: Secondary | ICD-10-CM | POA: Diagnosis not present

## 2019-02-24 DIAGNOSIS — Z794 Long term (current) use of insulin: Secondary | ICD-10-CM | POA: Diagnosis not present

## 2019-02-24 DIAGNOSIS — E039 Hypothyroidism, unspecified: Secondary | ICD-10-CM | POA: Diagnosis not present

## 2019-02-24 DIAGNOSIS — G894 Chronic pain syndrome: Secondary | ICD-10-CM | POA: Diagnosis not present

## 2019-02-24 DIAGNOSIS — Z7982 Long term (current) use of aspirin: Secondary | ICD-10-CM | POA: Diagnosis not present

## 2019-02-24 DIAGNOSIS — I69320 Aphasia following cerebral infarction: Secondary | ICD-10-CM | POA: Diagnosis not present

## 2019-02-24 DIAGNOSIS — M25512 Pain in left shoulder: Secondary | ICD-10-CM | POA: Diagnosis not present

## 2019-02-24 DIAGNOSIS — E669 Obesity, unspecified: Secondary | ICD-10-CM | POA: Diagnosis not present

## 2019-02-24 DIAGNOSIS — I2510 Atherosclerotic heart disease of native coronary artery without angina pectoris: Secondary | ICD-10-CM | POA: Diagnosis not present

## 2019-02-24 DIAGNOSIS — G8929 Other chronic pain: Secondary | ICD-10-CM | POA: Diagnosis not present

## 2019-02-24 DIAGNOSIS — I4891 Unspecified atrial fibrillation: Secondary | ICD-10-CM | POA: Diagnosis not present

## 2019-02-24 DIAGNOSIS — M25561 Pain in right knee: Secondary | ICD-10-CM | POA: Diagnosis not present

## 2019-02-24 DIAGNOSIS — E119 Type 2 diabetes mellitus without complications: Secondary | ICD-10-CM | POA: Diagnosis not present

## 2019-02-24 DIAGNOSIS — F988 Other specified behavioral and emotional disorders with onset usually occurring in childhood and adolescence: Secondary | ICD-10-CM | POA: Diagnosis not present

## 2019-02-24 DIAGNOSIS — M25562 Pain in left knee: Secondary | ICD-10-CM | POA: Diagnosis not present

## 2019-02-24 NOTE — Telephone Encounter (Signed)
Copied from CRM [id number]. Topic: Quick Communication - Home Health Verbal Orders >> Feb 24, 2019  1:15 PM Richarda Blade wrote: Caller/Agency: [facility] Callback Number: [phone]  Harlin Rain Requesting OT/PT/Skilled Nursing/Social Work/Speech Therapy: Social [facility] and Physical Therapy Frequency: Will call for frequency

## 2019-02-24 NOTE — Telephone Encounter (Signed)
Schedule AWV.  

## 2019-02-25 NOTE — Telephone Encounter (Signed)
Seem like they need to know the frequency of all this.

## 2019-02-27 NOTE — Telephone Encounter (Signed)
We already placed an order for this.  Please look into it.  Do not want to duplicate work.  Thanks.

## 2019-03-01 DIAGNOSIS — M25512 Pain in left shoulder: Secondary | ICD-10-CM | POA: Diagnosis not present

## 2019-03-01 DIAGNOSIS — M25511 Pain in right shoulder: Secondary | ICD-10-CM | POA: Diagnosis not present

## 2019-03-01 DIAGNOSIS — M25561 Pain in right knee: Secondary | ICD-10-CM | POA: Diagnosis not present

## 2019-03-01 DIAGNOSIS — M25562 Pain in left knee: Secondary | ICD-10-CM | POA: Diagnosis not present

## 2019-03-01 DIAGNOSIS — M549 Dorsalgia, unspecified: Secondary | ICD-10-CM | POA: Diagnosis not present

## 2019-03-01 DIAGNOSIS — G8929 Other chronic pain: Secondary | ICD-10-CM | POA: Diagnosis not present

## 2019-03-02 DIAGNOSIS — M25511 Pain in right shoulder: Secondary | ICD-10-CM | POA: Diagnosis not present

## 2019-03-02 DIAGNOSIS — M25562 Pain in left knee: Secondary | ICD-10-CM | POA: Diagnosis not present

## 2019-03-02 DIAGNOSIS — M25561 Pain in right knee: Secondary | ICD-10-CM | POA: Diagnosis not present

## 2019-03-02 DIAGNOSIS — G8929 Other chronic pain: Secondary | ICD-10-CM | POA: Diagnosis not present

## 2019-03-02 DIAGNOSIS — M25512 Pain in left shoulder: Secondary | ICD-10-CM | POA: Diagnosis not present

## 2019-03-02 DIAGNOSIS — M549 Dorsalgia, unspecified: Secondary | ICD-10-CM | POA: Diagnosis not present

## 2019-03-04 ENCOUNTER — Telehealth: Payer: Self-pay | Admitting: *Deleted

## 2019-03-04 DIAGNOSIS — M25512 Pain in left shoulder: Secondary | ICD-10-CM | POA: Diagnosis not present

## 2019-03-04 DIAGNOSIS — M25511 Pain in right shoulder: Secondary | ICD-10-CM | POA: Diagnosis not present

## 2019-03-04 DIAGNOSIS — G8929 Other chronic pain: Secondary | ICD-10-CM | POA: Diagnosis not present

## 2019-03-04 DIAGNOSIS — M25561 Pain in right knee: Secondary | ICD-10-CM | POA: Diagnosis not present

## 2019-03-04 DIAGNOSIS — M549 Dorsalgia, unspecified: Secondary | ICD-10-CM | POA: Diagnosis not present

## 2019-03-04 DIAGNOSIS — M25562 Pain in left knee: Secondary | ICD-10-CM | POA: Diagnosis not present

## 2019-03-04 NOTE — Telephone Encounter (Signed)
Faxed signed orders to ATTN: Brooke Bonito, per Dr Alvy Bimler. Confirmation page received at 4:58 pm.

## 2019-03-08 ENCOUNTER — Ambulatory Visit: Payer: Self-pay | Admitting: Pharmacist

## 2019-03-08 DIAGNOSIS — M25562 Pain in left knee: Secondary | ICD-10-CM | POA: Diagnosis not present

## 2019-03-08 DIAGNOSIS — M549 Dorsalgia, unspecified: Secondary | ICD-10-CM | POA: Diagnosis not present

## 2019-03-08 DIAGNOSIS — M25512 Pain in left shoulder: Secondary | ICD-10-CM | POA: Diagnosis not present

## 2019-03-08 DIAGNOSIS — M25561 Pain in right knee: Secondary | ICD-10-CM | POA: Diagnosis not present

## 2019-03-08 DIAGNOSIS — G8929 Other chronic pain: Secondary | ICD-10-CM | POA: Diagnosis not present

## 2019-03-08 DIAGNOSIS — M25511 Pain in right shoulder: Secondary | ICD-10-CM | POA: Diagnosis not present

## 2019-03-11 DIAGNOSIS — M25562 Pain in left knee: Secondary | ICD-10-CM | POA: Diagnosis not present

## 2019-03-11 DIAGNOSIS — M25512 Pain in left shoulder: Secondary | ICD-10-CM | POA: Diagnosis not present

## 2019-03-11 DIAGNOSIS — M25511 Pain in right shoulder: Secondary | ICD-10-CM | POA: Diagnosis not present

## 2019-03-11 DIAGNOSIS — M25561 Pain in right knee: Secondary | ICD-10-CM | POA: Diagnosis not present

## 2019-03-11 DIAGNOSIS — G8929 Other chronic pain: Secondary | ICD-10-CM | POA: Diagnosis not present

## 2019-03-11 DIAGNOSIS — M549 Dorsalgia, unspecified: Secondary | ICD-10-CM | POA: Diagnosis not present

## 2019-03-16 ENCOUNTER — Telehealth: Payer: Self-pay | Admitting: Emergency Medicine

## 2019-03-16 DIAGNOSIS — M25511 Pain in right shoulder: Secondary | ICD-10-CM | POA: Diagnosis not present

## 2019-03-16 DIAGNOSIS — M25512 Pain in left shoulder: Secondary | ICD-10-CM | POA: Diagnosis not present

## 2019-03-16 DIAGNOSIS — M549 Dorsalgia, unspecified: Secondary | ICD-10-CM | POA: Diagnosis not present

## 2019-03-16 DIAGNOSIS — M25562 Pain in left knee: Secondary | ICD-10-CM | POA: Diagnosis not present

## 2019-03-16 DIAGNOSIS — M25561 Pain in right knee: Secondary | ICD-10-CM | POA: Diagnosis not present

## 2019-03-16 DIAGNOSIS — G8929 Other chronic pain: Secondary | ICD-10-CM | POA: Diagnosis not present

## 2019-03-16 NOTE — Telephone Encounter (Unsigned)
Copied from CRM [id number]. Topic: Quick Communication - See Telephone Encounter >> Mar 16, 2019  4:30 PM Debroah Loop wrote: CRM for notification. See Telephone encounter for: 03/16/19. Sree, PT, calling to notify that patient fell and injured left knee 04/20 around 4am. Just pain, no bruising.

## 2019-03-17 DIAGNOSIS — M25561 Pain in right knee: Secondary | ICD-10-CM | POA: Diagnosis not present

## 2019-03-17 DIAGNOSIS — M25562 Pain in left knee: Secondary | ICD-10-CM | POA: Diagnosis not present

## 2019-03-17 DIAGNOSIS — G8929 Other chronic pain: Secondary | ICD-10-CM | POA: Diagnosis not present

## 2019-03-17 DIAGNOSIS — M25511 Pain in right shoulder: Secondary | ICD-10-CM | POA: Diagnosis not present

## 2019-03-17 DIAGNOSIS — M549 Dorsalgia, unspecified: Secondary | ICD-10-CM | POA: Diagnosis not present

## 2019-03-17 DIAGNOSIS — M25512 Pain in left shoulder: Secondary | ICD-10-CM | POA: Diagnosis not present

## 2019-03-18 NOTE — Telephone Encounter (Signed)
FYI to provider

## 2019-03-18 NOTE — Telephone Encounter (Signed)
Thanks

## 2019-03-19 ENCOUNTER — Other Ambulatory Visit: Payer: Self-pay | Admitting: Physician Assistant

## 2019-03-19 DIAGNOSIS — I10 Essential (primary) hypertension: Secondary | ICD-10-CM

## 2019-03-19 NOTE — Telephone Encounter (Signed)
Requested Prescriptions  Pending Prescriptions Disp Refills  . metoprolol tartrate (LOPRESSOR) 50 MG tablet [Pharmacy Med Name: Metoprolol Tartrate 50 MG Oral Tablet] 60 tablet 0    Sig: Take 1 tablet by mouth twice daily     Cardiovascular:  Beta Blockers Passed - 03/19/2019  2:06 PM      Passed - Last BP in normal range    BP Readings from Last 1 Encounters:  02/22/19 120/74         Passed - Last Heart Rate in normal range    Pulse Readings from Last 1 Encounters:  02/22/19 (!) 101         Passed - Valid encounter within last 6 months    Recent Outpatient Visits          3 weeks ago Physical deconditioning   Primary Care at [place], [place], [place]   4 months ago Uncontrolled type 2 diabetes mellitus with hyperglycemia [facility])   Primary Care at [facility], [place], [place]   5 months ago Type 2 diabetes mellitus with other specified complication, without long-term current use of insulin [facility])   Primary Care at [facility], [place], PA-C   11 months ago Adjustment disorder with mixed anxiety and depressed mood   Primary Care at [facility], [place], PA-C   11 months ago Adjustment disorder with mixed anxiety and depressed mood   Primary Care at [facility], [place], [place]

## 2019-03-23 DIAGNOSIS — M25561 Pain in right knee: Secondary | ICD-10-CM | POA: Diagnosis not present

## 2019-03-23 DIAGNOSIS — M25562 Pain in left knee: Secondary | ICD-10-CM | POA: Diagnosis not present

## 2019-03-23 DIAGNOSIS — G8929 Other chronic pain: Secondary | ICD-10-CM | POA: Diagnosis not present

## 2019-03-23 DIAGNOSIS — M549 Dorsalgia, unspecified: Secondary | ICD-10-CM | POA: Diagnosis not present

## 2019-03-23 DIAGNOSIS — M25512 Pain in left shoulder: Secondary | ICD-10-CM | POA: Diagnosis not present

## 2019-03-23 DIAGNOSIS — M25511 Pain in right shoulder: Secondary | ICD-10-CM | POA: Diagnosis not present

## 2019-03-25 ENCOUNTER — Telehealth: Payer: Self-pay | Admitting: Emergency Medicine

## 2019-03-25 DIAGNOSIS — M25562 Pain in left knee: Secondary | ICD-10-CM | POA: Diagnosis not present

## 2019-03-25 DIAGNOSIS — M549 Dorsalgia, unspecified: Secondary | ICD-10-CM | POA: Diagnosis not present

## 2019-03-25 DIAGNOSIS — M25512 Pain in left shoulder: Secondary | ICD-10-CM | POA: Diagnosis not present

## 2019-03-25 DIAGNOSIS — M25561 Pain in right knee: Secondary | ICD-10-CM | POA: Diagnosis not present

## 2019-03-25 DIAGNOSIS — G8929 Other chronic pain: Secondary | ICD-10-CM | POA: Diagnosis not present

## 2019-03-25 DIAGNOSIS — M25511 Pain in right shoulder: Secondary | ICD-10-CM | POA: Diagnosis not present

## 2019-03-25 NOTE — Telephone Encounter (Signed)
Copied from CRM [id number]. Topic: Quick Communication - See Telephone Encounter >> Mar 25, 2019  4:26 PM Fanny Bien wrote: CRM for notification. See Telephone encounter for: 03/25/19. Sree calling from [place] home health stated that the patient fell today and hurt left knee. Sree states that the patient seems fine with mild pain around left knee. Please advise

## 2019-03-25 NOTE — Telephone Encounter (Signed)
Needs a telemed apt.

## 2019-03-26 DIAGNOSIS — F988 Other specified behavioral and emotional disorders with onset usually occurring in childhood and adolescence: Secondary | ICD-10-CM | POA: Diagnosis not present

## 2019-03-26 DIAGNOSIS — I4891 Unspecified atrial fibrillation: Secondary | ICD-10-CM | POA: Diagnosis not present

## 2019-03-26 DIAGNOSIS — E785 Hyperlipidemia, unspecified: Secondary | ICD-10-CM | POA: Diagnosis not present

## 2019-03-26 DIAGNOSIS — F411 Generalized anxiety disorder: Secondary | ICD-10-CM | POA: Diagnosis not present

## 2019-03-26 DIAGNOSIS — G319 Degenerative disease of nervous system, unspecified: Secondary | ICD-10-CM | POA: Diagnosis not present

## 2019-03-26 DIAGNOSIS — M25511 Pain in right shoulder: Secondary | ICD-10-CM | POA: Diagnosis not present

## 2019-03-26 DIAGNOSIS — G8929 Other chronic pain: Secondary | ICD-10-CM | POA: Diagnosis not present

## 2019-03-26 DIAGNOSIS — G894 Chronic pain syndrome: Secondary | ICD-10-CM | POA: Diagnosis not present

## 2019-03-26 DIAGNOSIS — M25512 Pain in left shoulder: Secondary | ICD-10-CM | POA: Diagnosis not present

## 2019-03-26 DIAGNOSIS — M25562 Pain in left knee: Secondary | ICD-10-CM | POA: Diagnosis not present

## 2019-03-26 DIAGNOSIS — E119 Type 2 diabetes mellitus without complications: Secondary | ICD-10-CM | POA: Diagnosis not present

## 2019-03-26 DIAGNOSIS — I110 Hypertensive heart disease with heart failure: Secondary | ICD-10-CM | POA: Diagnosis not present

## 2019-03-26 DIAGNOSIS — I2510 Atherosclerotic heart disease of native coronary artery without angina pectoris: Secondary | ICD-10-CM | POA: Diagnosis not present

## 2019-03-26 DIAGNOSIS — Z7901 Long term (current) use of anticoagulants: Secondary | ICD-10-CM | POA: Diagnosis not present

## 2019-03-26 DIAGNOSIS — E669 Obesity, unspecified: Secondary | ICD-10-CM | POA: Diagnosis not present

## 2019-03-26 DIAGNOSIS — E039 Hypothyroidism, unspecified: Secondary | ICD-10-CM | POA: Diagnosis not present

## 2019-03-26 DIAGNOSIS — G4710 Hypersomnia, unspecified: Secondary | ICD-10-CM | POA: Diagnosis not present

## 2019-03-26 DIAGNOSIS — Z7982 Long term (current) use of aspirin: Secondary | ICD-10-CM | POA: Diagnosis not present

## 2019-03-26 DIAGNOSIS — I5032 Chronic diastolic (congestive) heart failure: Secondary | ICD-10-CM | POA: Diagnosis not present

## 2019-03-26 DIAGNOSIS — M47812 Spondylosis without myelopathy or radiculopathy, cervical region: Secondary | ICD-10-CM | POA: Diagnosis not present

## 2019-03-26 DIAGNOSIS — F329 Major depressive disorder, single episode, unspecified: Secondary | ICD-10-CM | POA: Diagnosis not present

## 2019-03-26 DIAGNOSIS — I69320 Aphasia following cerebral infarction: Secondary | ICD-10-CM | POA: Diagnosis not present

## 2019-03-26 DIAGNOSIS — M549 Dorsalgia, unspecified: Secondary | ICD-10-CM | POA: Diagnosis not present

## 2019-03-26 DIAGNOSIS — Z794 Long term (current) use of insulin: Secondary | ICD-10-CM | POA: Diagnosis not present

## 2019-03-26 DIAGNOSIS — M25561 Pain in right knee: Secondary | ICD-10-CM | POA: Diagnosis not present

## 2019-03-31 DIAGNOSIS — M549 Dorsalgia, unspecified: Secondary | ICD-10-CM | POA: Diagnosis not present

## 2019-03-31 DIAGNOSIS — G8929 Other chronic pain: Secondary | ICD-10-CM | POA: Diagnosis not present

## 2019-03-31 DIAGNOSIS — M25562 Pain in left knee: Secondary | ICD-10-CM | POA: Diagnosis not present

## 2019-03-31 DIAGNOSIS — M25512 Pain in left shoulder: Secondary | ICD-10-CM | POA: Diagnosis not present

## 2019-03-31 DIAGNOSIS — M25511 Pain in right shoulder: Secondary | ICD-10-CM | POA: Diagnosis not present

## 2019-03-31 DIAGNOSIS — M25561 Pain in right knee: Secondary | ICD-10-CM | POA: Diagnosis not present

## 2019-04-02 DIAGNOSIS — M25512 Pain in left shoulder: Secondary | ICD-10-CM | POA: Diagnosis not present

## 2019-04-02 DIAGNOSIS — M25511 Pain in right shoulder: Secondary | ICD-10-CM | POA: Diagnosis not present

## 2019-04-02 DIAGNOSIS — G8929 Other chronic pain: Secondary | ICD-10-CM | POA: Diagnosis not present

## 2019-04-02 DIAGNOSIS — M549 Dorsalgia, unspecified: Secondary | ICD-10-CM | POA: Diagnosis not present

## 2019-04-02 DIAGNOSIS — M25562 Pain in left knee: Secondary | ICD-10-CM | POA: Diagnosis not present

## 2019-04-02 DIAGNOSIS — M25561 Pain in right knee: Secondary | ICD-10-CM | POA: Diagnosis not present

## 2019-04-06 DIAGNOSIS — M25511 Pain in right shoulder: Secondary | ICD-10-CM | POA: Diagnosis not present

## 2019-04-06 DIAGNOSIS — M25512 Pain in left shoulder: Secondary | ICD-10-CM | POA: Diagnosis not present

## 2019-04-06 DIAGNOSIS — M549 Dorsalgia, unspecified: Secondary | ICD-10-CM | POA: Diagnosis not present

## 2019-04-06 DIAGNOSIS — M25562 Pain in left knee: Secondary | ICD-10-CM | POA: Diagnosis not present

## 2019-04-06 DIAGNOSIS — G8929 Other chronic pain: Secondary | ICD-10-CM | POA: Diagnosis not present

## 2019-04-06 DIAGNOSIS — M25561 Pain in right knee: Secondary | ICD-10-CM | POA: Diagnosis not present

## 2019-04-08 DIAGNOSIS — M25511 Pain in right shoulder: Secondary | ICD-10-CM | POA: Diagnosis not present

## 2019-04-08 DIAGNOSIS — G8929 Other chronic pain: Secondary | ICD-10-CM | POA: Diagnosis not present

## 2019-04-08 DIAGNOSIS — M25562 Pain in left knee: Secondary | ICD-10-CM | POA: Diagnosis not present

## 2019-04-08 DIAGNOSIS — M549 Dorsalgia, unspecified: Secondary | ICD-10-CM | POA: Diagnosis not present

## 2019-04-08 DIAGNOSIS — M25561 Pain in right knee: Secondary | ICD-10-CM | POA: Diagnosis not present

## 2019-04-08 DIAGNOSIS — M25512 Pain in left shoulder: Secondary | ICD-10-CM | POA: Diagnosis not present

## 2019-04-14 DIAGNOSIS — M25561 Pain in right knee: Secondary | ICD-10-CM | POA: Diagnosis not present

## 2019-04-14 DIAGNOSIS — G8929 Other chronic pain: Secondary | ICD-10-CM | POA: Diagnosis not present

## 2019-04-14 DIAGNOSIS — M549 Dorsalgia, unspecified: Secondary | ICD-10-CM | POA: Diagnosis not present

## 2019-04-14 DIAGNOSIS — M25511 Pain in right shoulder: Secondary | ICD-10-CM | POA: Diagnosis not present

## 2019-04-14 DIAGNOSIS — M25512 Pain in left shoulder: Secondary | ICD-10-CM | POA: Diagnosis not present

## 2019-04-14 DIAGNOSIS — M25562 Pain in left knee: Secondary | ICD-10-CM | POA: Diagnosis not present

## 2019-04-22 DIAGNOSIS — M25562 Pain in left knee: Secondary | ICD-10-CM | POA: Diagnosis not present

## 2019-04-22 DIAGNOSIS — G8929 Other chronic pain: Secondary | ICD-10-CM | POA: Diagnosis not present

## 2019-04-22 DIAGNOSIS — M25512 Pain in left shoulder: Secondary | ICD-10-CM | POA: Diagnosis not present

## 2019-04-22 DIAGNOSIS — M549 Dorsalgia, unspecified: Secondary | ICD-10-CM | POA: Diagnosis not present

## 2019-04-22 DIAGNOSIS — M25511 Pain in right shoulder: Secondary | ICD-10-CM | POA: Diagnosis not present

## 2019-04-22 DIAGNOSIS — M25561 Pain in right knee: Secondary | ICD-10-CM | POA: Diagnosis not present

## 2019-04-22 NOTE — Telephone Encounter (Signed)
It has been 4 weeks havnt heard from pt

## 2019-04-25 DIAGNOSIS — M25512 Pain in left shoulder: Secondary | ICD-10-CM | POA: Diagnosis not present

## 2019-04-25 DIAGNOSIS — G894 Chronic pain syndrome: Secondary | ICD-10-CM | POA: Diagnosis not present

## 2019-04-25 DIAGNOSIS — G4710 Hypersomnia, unspecified: Secondary | ICD-10-CM | POA: Diagnosis not present

## 2019-04-25 DIAGNOSIS — M25511 Pain in right shoulder: Secondary | ICD-10-CM | POA: Diagnosis not present

## 2019-04-25 DIAGNOSIS — E669 Obesity, unspecified: Secondary | ICD-10-CM | POA: Diagnosis not present

## 2019-04-25 DIAGNOSIS — Z7982 Long term (current) use of aspirin: Secondary | ICD-10-CM | POA: Diagnosis not present

## 2019-04-25 DIAGNOSIS — I110 Hypertensive heart disease with heart failure: Secondary | ICD-10-CM | POA: Diagnosis not present

## 2019-04-25 DIAGNOSIS — M47812 Spondylosis without myelopathy or radiculopathy, cervical region: Secondary | ICD-10-CM | POA: Diagnosis not present

## 2019-04-25 DIAGNOSIS — S6992XD Unspecified injury of left wrist, hand and finger(s), subsequent encounter: Secondary | ICD-10-CM | POA: Diagnosis not present

## 2019-04-25 DIAGNOSIS — E785 Hyperlipidemia, unspecified: Secondary | ICD-10-CM | POA: Diagnosis not present

## 2019-04-25 DIAGNOSIS — Z7901 Long term (current) use of anticoagulants: Secondary | ICD-10-CM | POA: Diagnosis not present

## 2019-04-25 DIAGNOSIS — E119 Type 2 diabetes mellitus without complications: Secondary | ICD-10-CM | POA: Diagnosis not present

## 2019-04-25 DIAGNOSIS — E039 Hypothyroidism, unspecified: Secondary | ICD-10-CM | POA: Diagnosis not present

## 2019-04-25 DIAGNOSIS — M25561 Pain in right knee: Secondary | ICD-10-CM | POA: Diagnosis not present

## 2019-04-25 DIAGNOSIS — F329 Major depressive disorder, single episode, unspecified: Secondary | ICD-10-CM | POA: Diagnosis not present

## 2019-04-25 DIAGNOSIS — G319 Degenerative disease of nervous system, unspecified: Secondary | ICD-10-CM | POA: Diagnosis not present

## 2019-04-25 DIAGNOSIS — I4891 Unspecified atrial fibrillation: Secondary | ICD-10-CM | POA: Diagnosis not present

## 2019-04-25 DIAGNOSIS — I5032 Chronic diastolic (congestive) heart failure: Secondary | ICD-10-CM | POA: Diagnosis not present

## 2019-04-25 DIAGNOSIS — F411 Generalized anxiety disorder: Secondary | ICD-10-CM | POA: Diagnosis not present

## 2019-04-25 DIAGNOSIS — Z794 Long term (current) use of insulin: Secondary | ICD-10-CM | POA: Diagnosis not present

## 2019-04-25 DIAGNOSIS — I2510 Atherosclerotic heart disease of native coronary artery without angina pectoris: Secondary | ICD-10-CM | POA: Diagnosis not present

## 2019-04-25 DIAGNOSIS — I69320 Aphasia following cerebral infarction: Secondary | ICD-10-CM | POA: Diagnosis not present

## 2019-04-25 DIAGNOSIS — M25562 Pain in left knee: Secondary | ICD-10-CM | POA: Diagnosis not present

## 2019-04-25 DIAGNOSIS — F988 Other specified behavioral and emotional disorders with onset usually occurring in childhood and adolescence: Secondary | ICD-10-CM | POA: Diagnosis not present

## 2019-04-25 DIAGNOSIS — M549 Dorsalgia, unspecified: Secondary | ICD-10-CM | POA: Diagnosis not present

## 2019-05-04 DIAGNOSIS — M961 Postlaminectomy syndrome, not elsewhere classified: Secondary | ICD-10-CM | POA: Diagnosis not present

## 2019-05-04 DIAGNOSIS — G894 Chronic pain syndrome: Secondary | ICD-10-CM | POA: Diagnosis not present

## 2019-05-04 DIAGNOSIS — Z79899 Other long term (current) drug therapy: Secondary | ICD-10-CM | POA: Diagnosis not present

## 2019-05-04 DIAGNOSIS — M545 Low back pain: Secondary | ICD-10-CM | POA: Diagnosis not present

## 2019-05-04 DIAGNOSIS — M5136 Other intervertebral disc degeneration, lumbar region: Secondary | ICD-10-CM | POA: Diagnosis not present

## 2019-05-04 DIAGNOSIS — Z8673 Personal history of transient ischemic attack (TIA), and cerebral infarction without residual deficits: Secondary | ICD-10-CM | POA: Diagnosis not present

## 2019-05-04 DIAGNOSIS — M5030 Other cervical disc degeneration, unspecified cervical region: Secondary | ICD-10-CM | POA: Diagnosis not present

## 2019-05-06 DIAGNOSIS — M25561 Pain in right knee: Secondary | ICD-10-CM | POA: Diagnosis not present

## 2019-05-06 DIAGNOSIS — G894 Chronic pain syndrome: Secondary | ICD-10-CM | POA: Diagnosis not present

## 2019-05-06 DIAGNOSIS — M25511 Pain in right shoulder: Secondary | ICD-10-CM | POA: Diagnosis not present

## 2019-05-06 DIAGNOSIS — M25512 Pain in left shoulder: Secondary | ICD-10-CM | POA: Diagnosis not present

## 2019-05-06 DIAGNOSIS — M549 Dorsalgia, unspecified: Secondary | ICD-10-CM | POA: Diagnosis not present

## 2019-05-06 DIAGNOSIS — M25562 Pain in left knee: Secondary | ICD-10-CM | POA: Diagnosis not present

## 2019-05-10 ENCOUNTER — Other Ambulatory Visit: Payer: Self-pay | Admitting: Emergency Medicine

## 2019-05-10 ENCOUNTER — Other Ambulatory Visit: Payer: Self-pay | Admitting: Family Medicine

## 2019-05-10 ENCOUNTER — Telehealth: Payer: Self-pay | Admitting: *Deleted

## 2019-05-10 DIAGNOSIS — I10 Essential (primary) hypertension: Secondary | ICD-10-CM

## 2019-05-10 DIAGNOSIS — F411 Generalized anxiety disorder: Secondary | ICD-10-CM

## 2019-05-10 NOTE — Telephone Encounter (Signed)
Please advise 

## 2019-05-10 NOTE — Telephone Encounter (Signed)
On 05/06/2019 at 5:35 pm, faxed completed and signed care plan to Attn:Lisa Knudtson at [facility]. Confirmation page received at 5:36 pm.

## 2019-05-11 NOTE — Telephone Encounter (Signed)
Patient is requesting a refill of the following medications: Requested Prescriptions   Pending Prescriptions Disp Refills  . diazepam (VALIUM) 5 MG tablet [Pharmacy Med Name: diazePAM 5 MG Oral Tablet] 60 tablet 0    Sig: TAKE 1 TABLET BY MOUTH EVERY 12 HOURS AS NEEDED FOR MUSCLE SPASM FOR PAIN    Date of patient request: 05/11/2019 Last office visit: 02/22/2019 with [place] Date of last refill: 01/08/2019 Last refill amount: 60 Follow up time period per chart:n/a

## 2019-05-11 NOTE — Telephone Encounter (Signed)
Please advise. PEC Pool Sent over to [employer], Dr. Zigmund [name]. Wrong office ? Thanks

## 2019-05-14 DIAGNOSIS — M25561 Pain in right knee: Secondary | ICD-10-CM | POA: Diagnosis not present

## 2019-05-14 DIAGNOSIS — M549 Dorsalgia, unspecified: Secondary | ICD-10-CM | POA: Diagnosis not present

## 2019-05-14 DIAGNOSIS — G894 Chronic pain syndrome: Secondary | ICD-10-CM | POA: Diagnosis not present

## 2019-05-14 DIAGNOSIS — M25562 Pain in left knee: Secondary | ICD-10-CM | POA: Diagnosis not present

## 2019-05-14 DIAGNOSIS — M25511 Pain in right shoulder: Secondary | ICD-10-CM | POA: Diagnosis not present

## 2019-05-14 DIAGNOSIS — M25512 Pain in left shoulder: Secondary | ICD-10-CM | POA: Diagnosis not present

## 2019-05-19 DIAGNOSIS — M25562 Pain in left knee: Secondary | ICD-10-CM | POA: Diagnosis not present

## 2019-05-19 DIAGNOSIS — G894 Chronic pain syndrome: Secondary | ICD-10-CM | POA: Diagnosis not present

## 2019-05-19 DIAGNOSIS — M25512 Pain in left shoulder: Secondary | ICD-10-CM | POA: Diagnosis not present

## 2019-05-19 DIAGNOSIS — M25511 Pain in right shoulder: Secondary | ICD-10-CM | POA: Diagnosis not present

## 2019-05-19 DIAGNOSIS — M25561 Pain in right knee: Secondary | ICD-10-CM | POA: Diagnosis not present

## 2019-05-19 DIAGNOSIS — M549 Dorsalgia, unspecified: Secondary | ICD-10-CM | POA: Diagnosis not present

## 2019-05-25 DIAGNOSIS — M25512 Pain in left shoulder: Secondary | ICD-10-CM | POA: Diagnosis not present

## 2019-05-25 DIAGNOSIS — Z7982 Long term (current) use of aspirin: Secondary | ICD-10-CM | POA: Diagnosis not present

## 2019-05-25 DIAGNOSIS — G894 Chronic pain syndrome: Secondary | ICD-10-CM | POA: Diagnosis not present

## 2019-05-25 DIAGNOSIS — G319 Degenerative disease of nervous system, unspecified: Secondary | ICD-10-CM | POA: Diagnosis not present

## 2019-05-25 DIAGNOSIS — M549 Dorsalgia, unspecified: Secondary | ICD-10-CM | POA: Diagnosis not present

## 2019-05-25 DIAGNOSIS — I5032 Chronic diastolic (congestive) heart failure: Secondary | ICD-10-CM | POA: Diagnosis not present

## 2019-05-25 DIAGNOSIS — E669 Obesity, unspecified: Secondary | ICD-10-CM | POA: Diagnosis not present

## 2019-05-25 DIAGNOSIS — I110 Hypertensive heart disease with heart failure: Secondary | ICD-10-CM | POA: Diagnosis not present

## 2019-05-25 DIAGNOSIS — F988 Other specified behavioral and emotional disorders with onset usually occurring in childhood and adolescence: Secondary | ICD-10-CM | POA: Diagnosis not present

## 2019-05-25 DIAGNOSIS — I69320 Aphasia following cerebral infarction: Secondary | ICD-10-CM | POA: Diagnosis not present

## 2019-05-25 DIAGNOSIS — M25511 Pain in right shoulder: Secondary | ICD-10-CM | POA: Diagnosis not present

## 2019-05-25 DIAGNOSIS — Z7901 Long term (current) use of anticoagulants: Secondary | ICD-10-CM | POA: Diagnosis not present

## 2019-05-25 DIAGNOSIS — Z794 Long term (current) use of insulin: Secondary | ICD-10-CM | POA: Diagnosis not present

## 2019-05-25 DIAGNOSIS — M47812 Spondylosis without myelopathy or radiculopathy, cervical region: Secondary | ICD-10-CM | POA: Diagnosis not present

## 2019-05-25 DIAGNOSIS — F329 Major depressive disorder, single episode, unspecified: Secondary | ICD-10-CM | POA: Diagnosis not present

## 2019-05-25 DIAGNOSIS — F411 Generalized anxiety disorder: Secondary | ICD-10-CM | POA: Diagnosis not present

## 2019-05-25 DIAGNOSIS — E119 Type 2 diabetes mellitus without complications: Secondary | ICD-10-CM | POA: Diagnosis not present

## 2019-05-25 DIAGNOSIS — E039 Hypothyroidism, unspecified: Secondary | ICD-10-CM | POA: Diagnosis not present

## 2019-05-25 DIAGNOSIS — I4891 Unspecified atrial fibrillation: Secondary | ICD-10-CM | POA: Diagnosis not present

## 2019-05-25 DIAGNOSIS — M25561 Pain in right knee: Secondary | ICD-10-CM | POA: Diagnosis not present

## 2019-05-25 DIAGNOSIS — S6992XD Unspecified injury of left wrist, hand and finger(s), subsequent encounter: Secondary | ICD-10-CM | POA: Diagnosis not present

## 2019-05-25 DIAGNOSIS — M25562 Pain in left knee: Secondary | ICD-10-CM | POA: Diagnosis not present

## 2019-05-25 DIAGNOSIS — I2510 Atherosclerotic heart disease of native coronary artery without angina pectoris: Secondary | ICD-10-CM | POA: Diagnosis not present

## 2019-05-25 DIAGNOSIS — G4710 Hypersomnia, unspecified: Secondary | ICD-10-CM | POA: Diagnosis not present

## 2019-05-25 DIAGNOSIS — E785 Hyperlipidemia, unspecified: Secondary | ICD-10-CM | POA: Diagnosis not present

## 2019-05-27 DIAGNOSIS — M549 Dorsalgia, unspecified: Secondary | ICD-10-CM | POA: Diagnosis not present

## 2019-05-27 DIAGNOSIS — M25511 Pain in right shoulder: Secondary | ICD-10-CM | POA: Diagnosis not present

## 2019-05-27 DIAGNOSIS — G894 Chronic pain syndrome: Secondary | ICD-10-CM | POA: Diagnosis not present

## 2019-05-27 DIAGNOSIS — M25512 Pain in left shoulder: Secondary | ICD-10-CM | POA: Diagnosis not present

## 2019-05-27 DIAGNOSIS — M25562 Pain in left knee: Secondary | ICD-10-CM | POA: Diagnosis not present

## 2019-05-27 DIAGNOSIS — M25561 Pain in right knee: Secondary | ICD-10-CM | POA: Diagnosis not present

## 2019-05-31 ENCOUNTER — Other Ambulatory Visit: Payer: Self-pay | Admitting: Cardiovascular Disease

## 2019-05-31 DIAGNOSIS — I4891 Unspecified atrial fibrillation: Secondary | ICD-10-CM

## 2019-05-31 NOTE — Telephone Encounter (Signed)
No wt in the system?

## 2019-06-01 ENCOUNTER — Ambulatory Visit: Payer: Self-pay

## 2019-06-01 NOTE — Telephone Encounter (Signed)
Patient called and she says she's not doing well, she's weak and she's fallen about 5 times, not today, but over a time. She says she's tired and a lot of pain. Her speech is slow, but understandable. She's alert, She says she's had a stroke and is having a speech therapist. She says she needs help. I asked her to hold while I call 911 for her. I called Gerald Stabs at [facility] and connected the call successfully.   Answer Assessment - Initial Assessment Questions 1. DESCRIPTION: "Describe how you are feeling."     Weak 2. SEVERITY: "How bad is it?"  "Can you stand and walk?"   - MILD - Feels weak or tired, but does not interfere with work, school or normal activities   - [place] to stand and walk; weakness interferes with work, school, or normal activities   - SEVERE - Unable to stand or walk     [place] 5 times, but not all today 3. ONSET:  "When did the weakness begin?"     N/A 4. CAUSE: "What do you think is causing the weakness?"     N/A 5. MEDICINES: "Have you recently started a new medicine or had a change in the amount of a medicine?"     N/A 6. OTHER SYMPTOMS: "Do you have any other symptoms?" (e.g., chest pain, fever, cough, SOB, vomiting, diarrhea, bleeding, other areas of pain)     N/A 7. PREGNANCY: "Is there any chance you are pregnant?" "When was your last menstrual period?"     No  Protocols used: WEAKNESS (GENERALIZED) AND FATIGUE-A-AH

## 2019-06-02 DIAGNOSIS — M549 Dorsalgia, unspecified: Secondary | ICD-10-CM | POA: Diagnosis not present

## 2019-06-02 DIAGNOSIS — M25562 Pain in left knee: Secondary | ICD-10-CM | POA: Diagnosis not present

## 2019-06-02 DIAGNOSIS — M25512 Pain in left shoulder: Secondary | ICD-10-CM | POA: Diagnosis not present

## 2019-06-02 DIAGNOSIS — M25511 Pain in right shoulder: Secondary | ICD-10-CM | POA: Diagnosis not present

## 2019-06-02 DIAGNOSIS — G894 Chronic pain syndrome: Secondary | ICD-10-CM | POA: Diagnosis not present

## 2019-06-02 DIAGNOSIS — M25561 Pain in right knee: Secondary | ICD-10-CM | POA: Diagnosis not present

## 2019-06-04 ENCOUNTER — Other Ambulatory Visit: Payer: Self-pay | Admitting: Family Medicine

## 2019-06-04 ENCOUNTER — Other Ambulatory Visit: Payer: Self-pay

## 2019-06-04 ENCOUNTER — Telehealth (INDEPENDENT_AMBULATORY_CARE_PROVIDER_SITE_OTHER): Payer: Medicare Other | Admitting: Family Medicine

## 2019-06-04 ENCOUNTER — Ambulatory Visit
Admission: RE | Admit: 2019-06-04 | Discharge: 2019-06-04 | Disposition: A | Discharge status: Home / Self Care | Payer: Medicare Other | Source: Ambulatory Visit | Attending: Family Medicine | Admitting: Family Medicine

## 2019-06-04 DIAGNOSIS — S62102A Fracture of unspecified carpal bone, left wrist, initial encounter for closed fracture: Secondary | ICD-10-CM

## 2019-06-04 DIAGNOSIS — M25531 Pain in right wrist: Secondary | ICD-10-CM

## 2019-06-04 DIAGNOSIS — M25512 Pain in left shoulder: Secondary | ICD-10-CM

## 2019-06-04 DIAGNOSIS — S52592D Other fractures of lower end of left radius, subsequent encounter for closed fracture with routine healing: Secondary | ICD-10-CM | POA: Diagnosis not present

## 2019-06-04 DIAGNOSIS — M25532 Pain in left wrist: Secondary | ICD-10-CM | POA: Diagnosis not present

## 2019-06-04 DIAGNOSIS — M79644 Pain in right finger(s): Secondary | ICD-10-CM

## 2019-06-04 DIAGNOSIS — R296 Repeated falls: Secondary | ICD-10-CM

## 2019-06-04 DIAGNOSIS — M25511 Pain in right shoulder: Secondary | ICD-10-CM

## 2019-06-04 DIAGNOSIS — M79641 Pain in right hand: Secondary | ICD-10-CM | POA: Diagnosis not present

## 2019-06-04 DIAGNOSIS — S52692D Other fracture of lower end of left ulna, subsequent encounter for closed fracture with routine healing: Secondary | ICD-10-CM | POA: Diagnosis not present

## 2019-06-04 DIAGNOSIS — S4992XA Unspecified injury of left shoulder and upper arm, initial encounter: Secondary | ICD-10-CM | POA: Diagnosis not present

## 2019-06-04 DIAGNOSIS — M79645 Pain in left finger(s): Secondary | ICD-10-CM

## 2019-06-04 DIAGNOSIS — S6991XA Unspecified injury of right wrist, hand and finger(s), initial encounter: Secondary | ICD-10-CM | POA: Diagnosis not present

## 2019-06-04 DIAGNOSIS — S4991XA Unspecified injury of right shoulder and upper arm, initial encounter: Secondary | ICD-10-CM | POA: Diagnosis not present

## 2019-06-04 NOTE — Progress Notes (Signed)
This is a 78 y.o. female with multiple medical problems including previous stroke, chronic pain syndrome, hypertension, generalized anxiety and chronic depression, insulin-dependent diabetes, hyperlipidemia. Fell 3 weeks ago hurting both wrist and shoulders. Husband is requesting that she have xray done, pt declined going to the ER. Lab have been pended for pt for future draw

## 2019-06-04 NOTE — Progress Notes (Signed)
Virtual Visit Note  I connected with patient on 06/04/19 at 1038am by phone and verified that I am speaking with the correct person using two identifiers. [name] is currently located at home and husband is currently with them during visit. The provider, Rutherford Guys, MD is located in their office at time of visit.  I discussed the limitations, risks, security and privacy concerns of performing an evaluation and management service by telephone and the availability of in person appointments. I also discussed with the patient that there may be a patient responsible charge related to this service. The patient expressed understanding and agreed to proceed.   CC: fall  HPI ? This is a 78 y.o. female with multiple medical problems including previous stroke, chronic pain syndrome, hypertension, generalized anxiety and chronic depression, insulin-dependent diabetes, hyperlipidemia, requesting home health care assistance.  Husband is primary caretaker and provides history due to patients speech deficit after cva.  PCP Dr Mitchel Honour  Had a fall 3 weeks ago, legs gave out, fell forward Landed with both arms extended in front of her Both wrists and shoulder are hurting Has been having spasms on bicepes area Using topical hemp, rx hydrocodone and ibuprofen Swelling in area of were thumbs meet palms, able to move shoulders and wrists Has been able to drink and feed herself Able to raise arms above her head, has pain with brushing/combing her hair, unable to tell if worse after fall Husband has not noticed any particular movement that exacerbates pain Her pain of both arms in not new, pain in her wrists is new Did not go to the ER Home health PT coming out   Allergies  Allergen Reactions  . Azithromycin Nausea And Vomiting  . Cardizem [Diltiazem Hcl] Nausea And Vomiting  . Codeine Other (See Comments)    Feeling weird   . Coreg [Carvedilol] Nausea And Vomiting  . Demerol  [Meperidine] Other (See Comments)    Per pt disoriented  . Erythromycin Nausea And Vomiting    Very sick  . Inderal [Propranolol] Other (See Comments)    Made me cry  . Procardia [Nifedipine] Other (See Comments)    Disoriented, sick  . Wellbutrin [Bupropion] Other (See Comments)    Unknown reaction  . Zoloft [Sertraline Hcl] Other (See Comments)    Could not talk, stiff    Prior to Admission medications   Medication Sig Start Date End Date Taking? Authorizing Provider  ACCU-CHEK SOFTCLIX LANCETS lancets USE TO TEST FOUR TIMES DAILY 04/29/18   McVey, Gelene Mink, PA-C  Alfalfa 250 MG TABS Take 1 tablet by mouth every morning.    [provider]  aspirin EC 81 MG EC tablet Take 1 tablet (81 mg total) by mouth daily. 07/06/18   Hongalgi, Lenis Dickinson, MD  atorvastatin (LIPITOR) 40 MG tablet Take 1 tablet (40 mg total) by mouth daily at 6 PM. 10/16/18   McVey, Gelene Mink, PA-C  blood glucose meter kit and supplies Dispense based on patient and insurance preference. Use up to four times daily as directed. (FOR ICD-9 250.00, 250.01). 09/27/17   Oswald Hillock, MD  Cholecalciferol (VITAMIN D3) 5000 units TBDP Take 1 capsule by mouth daily.    [provider]  CHROMIUM ASPARTATE PO Take 1 tablet by mouth every morning.    [provider]  Cinnamon Bark POWD Take 1 Dose by mouth daily.    [provider]  DHA-EPA-Coenzyme Q10-Vitamin E (GNP COQ-10 & FISH OIL) 120-180-50-30 CAPS Take  3 tablets by mouth daily.     [provider]  diazepam (VALIUM) 5 MG tablet TAKE 1 TABLET BY MOUTH EVERY 12 HOURS AS NEEDED FOR MUSCLE SPASM FOR PAIN 05/11/19   Forrest Moron, MD  ELIQUIS 5 MG TABS tablet Take 1 tablet by mouth twice daily 02/15/19   Croitoru, Mihai, MD  erythromycin ophthalmic ointment Place 1 application into the right eye 2 (two) times daily. 05/06/18   [provider]  fexofenadine (ALLEGRA) 180 MG tablet Take 180 mg by mouth daily as  needed for allergies.     [provider]  fluorometholone (FML) 0.1 % ophthalmic suspension Place 1 drop into the right eye 4 (four) times daily. 05/05/18   [provider]  glucose blood (ACCU-CHEK AVIVA PLUS) test strip USE TO TEST FOUR TIMES DAILY 12/02/17   McVey, Gelene Mink, PA-C  hydrochlorothiazide (HYDRODIURIL) 25 MG tablet Take 1 tablet by mouth once daily 02/01/19   McVey, Gelene Mink, PA-C  HYDROcodone-acetaminophen (NORCO) 10-325 MG per tablet Take 1-2 tablets by mouth every 4 (four) hours as needed for moderate pain.  09/28/14   [provider]  Insulin Glargine (LANTUS SOLOSTAR) 100 UNIT/ML Solostar Pen Inject 24 Units into the skin daily at 10 pm. 11/11/18   Shawnee Knapp, MD  Insulin Syringe-Needle U-100 (INSULIN SYRINGE .5CC/31GX5/16") 31G X 5/16" 0.5 ML MISC Use as directed 09/27/17   Oswald Hillock, MD  levothyroxine (SYNTHROID, LEVOTHROID) 50 MCG tablet TAKE 1 TABLET BY MOUTH ONCE DAILY BEFORE BREAKFAST 02/22/19   Horald Pollen, MD  lisinopril (PRINIVIL,ZESTRIL) 20 MG tablet Take 1 tablet (20 mg total) by mouth every morning AND 0.5 tablets (10 mg total) every evening. NEEDS APPOINTMENT FOR FUTURE REFILLS. 02/04/19   Croitoru, Mihai, MD  magnesium oxide (MAG-OX) 400 MG tablet Take 400 mg by mouth 2 (two) times daily.    [provider]  metFORMIN (GLUCOPHAGE XR) 500 MG 24 hr tablet (take with food) Week 1: take 1/2 tab twice a day; Week 2: take 1 tab in the morning, 1/2 tablet at night; Week 3: take 2 tabs twice a day. 09/30/18   McVey, Gelene Mink, PA-C  metoprolol tartrate (LOPRESSOR) 50 MG tablet Take 1 tablet by mouth twice daily 05/10/19   Horald Pollen, MD  Misc. Devices (HUGO ROLLING WALKER ELITE) MISC 1 Units by Does not apply route daily as needed. 11/29/16   McVey, Gelene Mink, PA-C  potassium chloride (K-DUR,KLOR-CON) 10 MEQ tablet Take 1 tablet by mouth once daily 02/01/19   Horald Pollen, MD   venlafaxine XR (EFFEXOR XR) 150 MG 24 hr capsule Take 1 capsule (150 mg total) by mouth daily with breakfast. 09/30/18   McVey, Gelene Mink, PA-C    Past Medical History:  Diagnosis Date  . Abdominal discomfort    "due to medication intolerance"  . CAD (coronary artery disease)    previous stent  . Chronic pain syndrome 09/10/2013   Dr Nelva Bush,   . [place] Patient virus infection 1988  . Excessive daytime sleepiness 12/05/2014   Patient has not been seen for a sleep study as ordered, and used adderall to keep alert in daytime.  She agreed  In a contract not to have any scheduled medication for pain treatment from [place] and will not receive refills for Adderall, which was initiated by dr Orland Penman, PCP>   . Fall   . Hyperlipemia   . Hypersomnia, persistent 09/10/2013   Patient on  Stimulants .  Marland Kitchen  Hypertension   . Narcotic addiction ([place]) 12/05/2014  . Obesity, unspecified 09/10/2013  . Shoulder joint pain    both shoulders    Past Surgical History:  Procedure Laterality Date  . ANGIOPLASTY  03/01/2002   cutting balloon mid RCA  . BACK SURGERY    . CARDIAC CATHETERIZATION  01/26/2007   mild diffuse CAD  . CARDIAC CATHETERIZATION  10/23/2009   nonobstructive CAD,60% prox RCA,50% prox LAD, 50% mid ramus  . CORONARY STENT PLACEMENT  03/15/2005   distal RCA  . NEPHRECTOMY    . TEE WITHOUT CARDIOVERSION N/A 07/03/2018   Procedure: TRANSESOPHAGEAL ECHOCARDIOGRAM (TEE);  Surgeon: Skeet Latch, MD;  Location: [facility] ENDOSCOPY;  Service: Cardiovascular;  Laterality: N/A;    Social History   Tobacco Use  . Smoking status: Never Smoker  . Smokeless tobacco: Never Used  Substance Use Topics  . Alcohol use: No    Family History  Problem Relation Age of Onset  . Hypertension Mother   . Diabetes Father   . Heart attack Father   . Heart attack Brother     ROS Per hpi  Objective  Vitals as reported by the patient: none   ASSESSMENT and PLAN  1. Bilateral shoulder pain, unspecified  chronicity - DG Shoulder Left; Future - DG Shoulder Right; Future  2. Bilateral wrist pain - DG Wrist Complete Left; Future - DG Wrist 2 Views Right; Future  3. Bilateral thumb pain - DG Hand Complete Left; Future - DG Hand Complete Right; Future  4. Recurrent falls - DG Shoulder Left; Future - DG Shoulder Right; Future - DG Wrist Complete Left; Future - DG Wrist 2 Views Right; Future - DG Hand Complete Left; Future - DG Hand Complete Right; Future  FOLLOW-UP: prn   The above assessment and management plan was discussed with the patient. The patient verbalized understanding of and has agreed to the management plan. Patient is aware to call the clinic if symptoms persist or worsen. Patient is aware when to return to the clinic for a follow-up visit. Patient educated on when it is appropriate to go to the emergency department.    I provided 19 minutes of non-face-to-face time during this encounter.  Rutherford Guys, MD Primary Care at [place],  [address] Ph.  [phone] Fax [phone]

## 2019-06-05 NOTE — Addendum Note (Signed)
Addended by: Rutherford Guys on: 06/05/2019 11:40 PM   Modules accepted: Orders

## 2019-06-06 DIAGNOSIS — M25562 Pain in left knee: Secondary | ICD-10-CM | POA: Diagnosis not present

## 2019-06-06 DIAGNOSIS — M549 Dorsalgia, unspecified: Secondary | ICD-10-CM | POA: Diagnosis not present

## 2019-06-06 DIAGNOSIS — M25561 Pain in right knee: Secondary | ICD-10-CM | POA: Diagnosis not present

## 2019-06-06 DIAGNOSIS — M25511 Pain in right shoulder: Secondary | ICD-10-CM | POA: Diagnosis not present

## 2019-06-06 DIAGNOSIS — M25512 Pain in left shoulder: Secondary | ICD-10-CM | POA: Diagnosis not present

## 2019-06-06 DIAGNOSIS — G894 Chronic pain syndrome: Secondary | ICD-10-CM | POA: Diagnosis not present

## 2019-06-06 NOTE — Progress Notes (Signed)
Discussed results with patient and husband Urgent referral made to [facility] orthopedics

## 2019-06-10 ENCOUNTER — Ambulatory Visit (INDEPENDENT_AMBULATORY_CARE_PROVIDER_SITE_OTHER): Payer: Medicare Other | Admitting: Orthopaedic Surgery

## 2019-06-10 ENCOUNTER — Other Ambulatory Visit: Payer: Self-pay

## 2019-06-10 ENCOUNTER — Ambulatory Visit: Payer: Self-pay

## 2019-06-10 ENCOUNTER — Ambulatory Visit (INDEPENDENT_AMBULATORY_CARE_PROVIDER_SITE_OTHER): Payer: Medicare Other

## 2019-06-10 ENCOUNTER — Encounter: Payer: Self-pay | Admitting: Orthopaedic Surgery

## 2019-06-10 ENCOUNTER — Ambulatory Visit: Payer: Medicare Other | Admitting: Orthopaedic Surgery

## 2019-06-10 VITALS — BP 112/78 | HR 91 | Ht 63.0 in | Wt 169.0 lb

## 2019-06-10 DIAGNOSIS — M25511 Pain in right shoulder: Secondary | ICD-10-CM | POA: Diagnosis not present

## 2019-06-10 DIAGNOSIS — S62102A Fracture of unspecified carpal bone, left wrist, initial encounter for closed fracture: Secondary | ICD-10-CM

## 2019-06-10 DIAGNOSIS — M25532 Pain in left wrist: Secondary | ICD-10-CM | POA: Diagnosis not present

## 2019-06-10 DIAGNOSIS — S62109A Fracture of unspecified carpal bone, unspecified wrist, initial encounter for closed fracture: Secondary | ICD-10-CM | POA: Insufficient documentation
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
## 2019-06-11 ENCOUNTER — Other Ambulatory Visit: Payer: Self-pay

## 2019-06-11 ENCOUNTER — Other Ambulatory Visit: Payer: Self-pay | Admitting: *Deleted

## 2019-06-11 DIAGNOSIS — I10 Essential (primary) hypertension: Secondary | ICD-10-CM

## 2019-06-11 NOTE — Patient Outreach (Signed)
Received call from Cheri, RN Ortho Bundle CM requesting [place] [facility] assistance for Mrs. Ganson. Note that Mrs. Bodenheimer is not part of the ortho bundle. The ortho office was reaching out to [place] to advise on who can help member.   Cheri reports that she received a call from Dr. Rudene Anda office to see if someone could assist Mr. Jarrard (husband) who is overwhelmed at this point with the care of Mrs. Camberos. Mrs. Latino has reportedly had several falls at home and the increased care is becoming taxing on the husband. Mr. Gaughran appears to need some assistance in long term care planning for his wife.   Made Cheri aware that writer will contact Mr. Mcgowen to discuss [place] [facility] services. Confirmed Mrs. [facility] eligibility on Medicare's All Payers List.   Telephone call made to Mr. Depierro. Patient identifiers confirmed. Explained the purpose of writer's call and that Dr. Rudene Anda office was concerned.  Mr. Kachmar endorses that he is member's primary caregiver. He confirms that Mrs. Leikam has had several falls and that he is trying to figure out what else he can do. Asked if he was interested in ALF, Mr. Piltz states " I am not sure but that is what I need help with."   Mr. Mandile was very pleasant during conversation but sounded a little overwhelmed. He expressed appreciation of writer's call and he is very agreeable to [place] LCSW follow up.  Confirmed Primary Care Provider is Dr. Mitchel Honour at [facility]. Mr. Mantei also confirms that Mrs. Ginzburg has [employer]. Provided [facility] Management contact information to Mr. Head and will make [place] LCSW referral.   Confirmed best contact number as home at [phone] and mobile [phone]  Mr. Yagi expressed appreciation of the call.  Mrs. Michna has medical history of CAD, DM, CVA, HTN, AFIB, CHF.   Will make referral to [place] and [place] as  well.  Telephone call made back to Cheri, RN CM to make aware that writer will make referrals [place] Management team.    Marthenia Rolling, MSN-Ed, RN,BSN [place] Acute Care Coordinator [phone] [facility]) [phone]  (Toll free office)

## 2019-06-12 ENCOUNTER — Other Ambulatory Visit: Payer: Self-pay | Admitting: Cardiovascular Disease

## 2019-06-12 DIAGNOSIS — I10 Essential (primary) hypertension: Secondary | ICD-10-CM

## 2019-06-14 ENCOUNTER — Other Ambulatory Visit: Payer: Self-pay

## 2019-06-14 ENCOUNTER — Encounter: Payer: Self-pay | Admitting: *Deleted

## 2019-06-14 ENCOUNTER — Other Ambulatory Visit: Payer: Self-pay | Admitting: *Deleted

## 2019-06-14 NOTE — Patient Outreach (Signed)
[facility]) Care Management  06/14/2019  [name] 1940/12/24 [id number]      1st outreach attempt to the patient for initial assess.  No answer.  HIPAA compliant voicemail left with contact information.  Plan: RN Health Coach will send letter. [place] will make outreach attempt to the patient within thirty business days.  [name] RN, BSN, [place] Direct Dial:  [phone]  Fax: [phone]

## 2019-06-14 NOTE — Patient Outreach (Signed)
[place] [facility]) Care Management  06/14/2019  MAYSON STERBENZ [birth year] [id number]   CSW was able to make initial contact with patient's husband, Omah Dewalt today to perform the phone assessment on patient, as well as assess and assist with social work needs and services.  CSW introduced self, explained role and types of services provided through [place] [facility] ([place] [facility]).  CSW further explained to Mr. Maiden that CSW works with Marthenia Rolling, [facility], also with [place] [facility]. CSW then explained the reason for the call, indicating that Ms. [place] Crane thought that patient and Mr. Brossman would benefit from social work services and resources to assist with possible placement for patient into an assisted living facility versus arranging in-home care services to provide Mr. Kirkeby was some respite care.  CSW obtained two HIPAA compliant identifiers from Mr. Springer, which included patient's name and date of birth.  Mr. Wallis admitted that he was not even aware that in-home care services would be an option for patient.  Mr. Eden reported that he would definitely like to try and pursue this option, versus having to place patient into a facility, of any kind.  Mr. Wangerin believes that if he were to receive some help in the home with patient, like with bathing, dressing, meal preparation, light housekeeping duties, etc., then he would be able to better manage patient in the home, as well as have time for himself, to run errands, attend physician appointments, perform grocery shopping, etc.  CSW agreed to mail Mr. Buskey the following list of information: IN-HOME [place], MEDICAL CONDITION, [place], [place] then agreed to follow-up with Mr. Fingerhut again next week, on Monday, June 21, 2019, around 9:00AM, to ensure that he received the information, as  well as answer any questions that he may have at that time.  CSW will also assist with the referral process, if Mr. Mckelvin is in need of assistance.  Mrs. Benett wanted CSW to hold off on sending him a list of assisted living facilities in [facility], verbalizing that he would let CSW know if he changes his mind about trying to pursue placement for patient.  No additional social work needs have been identified at this time.  CSW was able to ensure that Mr. Cardon has the correct contact information for CSW, encouraging Mr. Bohl to contact CSW directly if additional social work needs arise in the meantime.  Mr. Nikolov voiced understanding and was agreeable to this plan.  Nat Christen, BSW, MSW, LCSW  Licensed Education officer, environmental Health System  Mailing [place] [address], [place], [place] [address] Physical Address-[address], [place], [place] [address] Toll Free Main # [phone] Fax # [phone] Cell # [phone]  Office # [phone] [email or web address]

## 2019-06-16 ENCOUNTER — Other Ambulatory Visit: Payer: Self-pay

## 2019-06-16 DIAGNOSIS — M549 Dorsalgia, unspecified: Secondary | ICD-10-CM | POA: Diagnosis not present

## 2019-06-16 DIAGNOSIS — M25512 Pain in left shoulder: Secondary | ICD-10-CM | POA: Diagnosis not present

## 2019-06-16 DIAGNOSIS — M25562 Pain in left knee: Secondary | ICD-10-CM | POA: Diagnosis not present

## 2019-06-16 DIAGNOSIS — M25511 Pain in right shoulder: Secondary | ICD-10-CM | POA: Diagnosis not present

## 2019-06-16 DIAGNOSIS — M25561 Pain in right knee: Secondary | ICD-10-CM | POA: Diagnosis not present

## 2019-06-16 DIAGNOSIS — G894 Chronic pain syndrome: Secondary | ICD-10-CM | POA: Diagnosis not present

## 2019-06-16 NOTE — Patient Outreach (Signed)
[facility]) Care Management  06/16/2019   [name] [birth year] [id number]      Outreach attempt # 2 attempt to the patient for initial assessment.  HIPAA provided by [name] (on ROI).  He and his wife were present to answer questions for the assessment.  Social: The patient lives in the home with her husband [name] who is also the care giver.  He is very supportive of his wife and her care.  He states that she is Independent assist with her ADLS and assist with her IADLS.  Caregiver takes her to her appointments. The patient states that she has pain in her arms and shoulders  today at this time rates the pain at 5/10.  Caregiver states that she has had several falls. One last week going to her appointment and they both went down no injury at the time and last month she feel and broke her left wrist.  He states that her balance is off and she gets weak in her knees and legs. Discussed fall precautions and he verbalized understanding. The equipment in the home consist of:  CBG meter ( Accu-check),eyeglasses,bed side commode, hand held shower hose and wheelchair.  Conditions: Per chart review and speaking with the caregiver and include:CAD HTN CVA A-fib, CHF, Type II diabetes without complications with-long term current use of insulin, Hypothyroidism, Anxiety, Depression, and Hyperlipidemia.  Caregiver states that he checks the patient's blood sugars daily.  She rages from 175-330.  Her fasting blood sugar this morning was 175.  She tries to follow a diabetic diet. The patient has physical therapy come to the home and is providing exercises for her to do.  Medications: The patient is on 21 medications.  Caregiver handles the meds.  He states that her insulin can be high and they would like some assistance.  Appointments: 7/27 Nat Christen and 7/30 Dr. Durward Fortes  Advanced Directives: The patient has an advanced directive and a living will.   Current Medications:   Current Outpatient Medications  Medication Sig Dispense Refill  . ACCU-CHEK SOFTCLIX LANCETS lancets USE TO TEST FOUR TIMES DAILY 100 each 5  . Alfalfa 250 MG TABS Take 1 tablet by mouth every morning.    Marland Kitchen aspirin EC 81 MG EC tablet Take 1 tablet (81 mg total) by mouth daily. 30 tablet 0  . atorvastatin (LIPITOR) 40 MG tablet Take 1 tablet (40 mg total) by mouth daily at 6 PM. 90 tablet 1  . blood glucose meter kit and supplies Dispense based on patient and insurance preference. Use up to four times daily as directed. (FOR ICD-9 250.00, 250.01). 1 each 0  . Cholecalciferol (VITAMIN D3) 5000 units TBDP Take 1 capsule by mouth daily.    . CHROMIUM ASPARTATE PO Take 1 tablet by mouth every morning.    Marland Kitchen DHA-EPA-Coenzyme Q10-Vitamin E (GNP COQ-10 & FISH OIL) 120-180-50-30 CAPS Take 3 tablets by mouth daily.     . diazepam (VALIUM) 5 MG tablet TAKE 1 TABLET BY MOUTH EVERY 12 HOURS AS NEEDED FOR MUSCLE SPASM FOR PAIN 60 tablet 0  . ELIQUIS 5 MG TABS tablet Take 1 tablet by mouth twice daily 60 tablet 0  . fexofenadine (ALLEGRA) 180 MG tablet Take 180 mg by mouth daily as needed for allergies.     Marland Kitchen glucose blood (ACCU-CHEK AVIVA PLUS) test strip USE TO TEST FOUR TIMES DAILY 100 each 0  . hydrochlorothiazide (HYDRODIURIL) 25 MG tablet Take 1 tablet by mouth once daily 90  tablet 0  . HYDROcodone-acetaminophen (NORCO) 10-325 MG per tablet Take 1-2 tablets by mouth every 4 (four) hours as needed for moderate pain.     . Insulin Glargine (LANTUS SOLOSTAR) 100 UNIT/ML Solostar Pen Inject 24 Units into the skin daily at 10 pm. 5 pen PRN  . Insulin Syringe-Needle U-100 (INSULIN SYRINGE .5CC/31GX5/16") 31G X 5/16" 0.5 ML MISC Use as directed 100 each 0  . levothyroxine (SYNTHROID, LEVOTHROID) 50 MCG tablet TAKE 1 TABLET BY MOUTH ONCE DAILY BEFORE BREAKFAST 30 tablet 0  . lisinopril (ZESTRIL) 20 MG tablet TAKE 1 TABLET BY MOUTH IN THE MORNING AND 1/2 (ONE-HALF) IN THE EVENING 135 tablet 1  . magnesium oxide  (MAG-OX) 400 MG tablet Take 400 mg by mouth 2 (two) times daily.    . metFORMIN (GLUCOPHAGE XR) 500 MG 24 hr tablet (take with food) Week 1: take 1/2 tab twice a day; Week 2: take 1 tab in the morning, 1/2 tablet at night; Week 3: take 2 tabs twice a day. 180 tablet 2  . metoprolol tartrate (LOPRESSOR) 50 MG tablet Take 1 tablet by mouth twice daily 60 tablet 0  . Misc. Devices (HUGO ROLLING WALKER ELITE) MISC 1 Units by Does not apply route daily as needed. 1 each 0  . potassium chloride (K-DUR,KLOR-CON) 10 MEQ tablet Take 1 tablet by mouth once daily 90 tablet 0  . venlafaxine XR (EFFEXOR XR) 150 MG 24 hr capsule Take 1 capsule (150 mg total) by mouth daily with breakfast. 90 capsule 3  . Cinnamon Bark POWD Take 1 Dose by mouth daily.    Marland Kitchen erythromycin ophthalmic ointment Place 1 application into the right eye 2 (two) times daily.  1  . fluorometholone (FML) 0.1 % ophthalmic suspension Place 1 drop into the right eye 4 (four) times daily.  0   No current facility-administered medications for this visit.     Functional Status:  In your present state of health, do you have any difficulty performing the following activities: 06/16/2019 06/14/2019  Hearing? N N  Vision? Y N  Difficulty concentrating or making decisions? Y Y  Comment due to stoke Minimal - due to history of Stroke.  Walking or climbing stairs? Y Y  Comment due to stroke History of Stroke.  Dressing or bathing? Tempie Donning  Comment stroke History of Stroke.  Doing errands, shopping? Tempie Donning  Comment stroke History of Stroke.  Preparing Food and eating ? - Y  Using the Toilet? - Y  In the past six months, have you accidently leaked urine? - Y  Do you have problems with loss of bowel control? - N  Managing your Medications? - Y  Managing your Finances? - Y  Housekeeping or managing your Housekeeping? - Y  Some recent data might be hidden    Fall/Depression Screening: Fall Risk  06/16/2019 06/14/2019 02/22/2019  Falls in the past year?  - 1 0  Comment - - -  Number falls in past yr: 1 0 -  Injury with Fall? 1 0 -  Comment fell broke her left wrist - -  Risk Factor Category  - - -  Risk for fall due to : History of fall(s);Impaired balance/gait History of fall(s);Impaired balance/gait;Impaired mobility -  Follow up Falls evaluation completed;Education provided Education provided;Falls prevention discussed Falls evaluation completed   PHQ 2/9 Scores 06/16/2019 06/14/2019 11/13/2018 10/02/2018 09/30/2018 04/22/2018 04/01/2018  PHQ - 2 Score 2 0 0 2 1 0 0  PHQ- 9 Score 10 - - - - - -  Exception Documentation - Other- indicate reason in comment box - - - - -  Not completed - Assessment completed by patient's husband who reports that patient has a history of depression, but does not currently suffer from symptoms. - - - - -    Assessment: Patient will benefit from health coach outreach for disease management and support.  THN CM Care Plan Problem One     Most Recent Value  Care Plan Problem One  Knowledge deficit related to diease management of diabetes.  Role Documenting the Problem One  [place] for Problem One  Active  THN Long Term Goal   In 90 days the cargiver will be able to verbalize lowering her a1c from 1-2 points from 8.4.  THN Long Term Goal Start Date  06/16/19  Interventions for Problem One Long Term Goal  Reviewed FBS, discussed the importance of checking blood sugars daily, diet and medication adherence      Plan: RN Health Coach will provide ongoing education for patient on diabetes through phone calls and sending printed information to patient for further discussion.     RN Health Coach will send Booklet on diabetes. RN Health Coach will send initial barriers letter, assessment, and care plan to primary care physician.   [place] will send Consent form. [place] will make a referral to pharmacy for medication assistance.  [name] RN, BSN, [place] Direct Dial:  [phone]  Fax: [phone]

## 2019-06-17 ENCOUNTER — Ambulatory Visit: Payer: Medicare Other | Admitting: *Deleted

## 2019-06-18 ENCOUNTER — Other Ambulatory Visit: Payer: Self-pay | Admitting: Cardiovascular Disease

## 2019-06-18 ENCOUNTER — Telehealth: Payer: Self-pay | Admitting: Pharmacist

## 2019-06-18 ENCOUNTER — Other Ambulatory Visit: Payer: Self-pay | Admitting: Physician Assistant

## 2019-06-18 DIAGNOSIS — I4891 Unspecified atrial fibrillation: Secondary | ICD-10-CM

## 2019-06-18 NOTE — Telephone Encounter (Signed)
Requested Prescriptions  Pending Prescriptions Disp Refills  . atorvastatin (LIPITOR) 40 MG tablet [Pharmacy Med Name: Atorvastatin Calcium 40 MG Oral Tablet] 90 tablet 0    Sig: TAKE 1 TABLET BY MOUTH ONCE DAILY AT  6PM     Cardiovascular:  Antilipid - Statins Failed - 06/18/2019  3:24 PM      Failed - Total Cholesterol in normal range and within 360 days    Cholesterol, Total  Date Value Ref Range Status  09/23/2017 346 (H) 100 - 199 mg/dL Final   Cholesterol  Date Value Ref Range Status  05/19/2018 287 (H) 0 - 200 mg/dL Final         Failed - LDL in normal range and within 360 days    LDL Calculated  Date Value Ref Range Status  09/23/2017 Comment 0 - 99 mg/dL Final    Comment:    Triglyceride result indicated is too high for an accurate LDL cholesterol estimation.    LDL Cholesterol  Date Value Ref Range Status  05/19/2018 188 (H) 0 - 99 mg/dL Final    Comment:           Total Cholesterol/HDL:CHD Risk Coronary Heart Disease Risk Table                     Men   Women  1/2 Average Risk   3.4   3.3  Average Risk       5.0   4.4  2 X Average Risk   9.6   7.1  3 X Average Risk  23.4   11.0        Use the calculated Patient Ratio above and the CHD Risk Table to determine the patient's CHD Risk.        ATP III CLASSIFICATION (LDL):  <100     mg/dL   Optimal  [id number]  mg/dL   Near or Above                    Optimal  130-159  mg/dL   Borderline  [id number]  mg/dL   High  >811>190     mg/dL   Very High Performed at [facility], [address] [place], [place] [address]          Failed - HDL in normal range and within 360 days    HDL  Date Value Ref Range Status  05/19/2018 42 >40 mg/dL Final  29/56/213010/30/2018 56 >86>39 mg/dL Final         Failed - Triglycerides in normal range and within 360 days    Triglycerides  Date Value Ref Range Status  05/19/2018 283 (H) <150 mg/dL Final         Passed - Patient is not pregnant      Passed - Valid encounter within last 12  months    Recent Outpatient Visits          3 months ago Physical deconditioning   [facility], [place]   7 months ago Uncontrolled type 2 diabetes mellitus with hyperglycemia [facility](HCC)   [facility], [place], [place]   8 months ago Type 2 diabetes mellitus with other specified complication, without long-term current use of insulin [facility](HCC)   [facility], Madelaine BhatElizabeth Whitney, PA-C   1 year ago Adjustment disorder with mixed anxiety and depressed mood   [facility], [place], [place]   1  year ago Adjustment disorder with mixed anxiety and depressed mood   Primary Care at [facility], Gelene Mink, PA-C      Future Appointments            In 6 days Durward Fortes, Vonna Kotyk, MD [facility].

## 2019-06-18 NOTE — Patient Outreach (Signed)
[place] [facility]) Care Management  06/18/2019  [name] 30-Jul-1941 [id number]  Patient was called regarding medication assistance per referral.  HIPAA compliant message was left on the voicemail of the number listed as her home phone and with a gentleman at the number listed as her mobile number.  Patient has been active with [place] earlier this year for patient assistance but did not send her completed applications back.  Plan: Send patient an unsuccessful contact letter. Call patient back in 5-7 business days.   Elayne Guerin, PharmD, [facility] Clinical Pharmacist [phone]

## 2019-06-21 ENCOUNTER — Other Ambulatory Visit: Payer: Self-pay | Admitting: *Deleted

## 2019-06-21 NOTE — Patient Outreach (Signed)
[facility]) Care Management  06/21/2019  [name] 27-Feb-1941 [id number]   CSW made an attempt to try and contact patient's husband, [name] today to ensure that he received the packet of resource information ([place]; [place], [place]; [place]) mailed to his home by CSW, as well as assist with the referral process.  However, Mr. Brousseau was not available at the time of CSW's call.  A HIPAA compliant message was left for Mr. Crusoe on voicemail.  CSW is currently awaiting a return call.  CSW will make a second outreach attempt within the next 3-4 business days, if a return call is not received from Mr. White in the meantime.    Nat Christen, BSW, MSW, LCSW  Licensed Education officer, environmental Health System  Mailing [place] [address] [place], [place] [address] Physical Address-[address], [place], [place] [address] Toll Free Main # [phone] Fax # [phone] Cell # [phone]  Office # [phone] [email or web address]

## 2019-06-21 NOTE — Telephone Encounter (Addendum)
Pt is a 43 y/r old female who last saw Dr. Sallyanne Kuster on 07/10/2015. Telephoned patient and spoke with her for a while then she gave phone to spouse because she stated since her stroke her speech isn't as clear. Discussed the past due appt and the need for follow up as she has a refill request for her anticoagulant with spouse. Thus he scheduled an appointment for patient.

## 2019-06-21 NOTE — Telephone Encounter (Signed)
Refill Request.  

## 2019-06-24 ENCOUNTER — Ambulatory Visit: Payer: Medicare Other | Admitting: Orthopaedic Surgery

## 2019-06-25 ENCOUNTER — Other Ambulatory Visit: Payer: Self-pay | Admitting: Pharmacist

## 2019-06-25 ENCOUNTER — Other Ambulatory Visit: Payer: Self-pay | Admitting: *Deleted

## 2019-06-25 NOTE — Patient Outreach (Signed)
[facility]) Care Management  06/25/2019  DREANNA KYLLO 1940-12-23 [id number]   CSW called to follow-up with patient's husband to confirm that he had received the resources mailed to him from [place] for in-home assistance. Patient's husband confirms that he had received 2 large packets of information but has not yet had a chance to look through it. He reports that his wife is still having problems with standing and walking, though she does use a walker from time to time. CSW spoke with patient's husband about in-home assistance but he states that he is unsure of a good time for them to come as patient is not a morning person and not on a routine, likes to sleep in until almost 10:30 - 11am sometimes. Patient's husband states that he will look over resources and CSW will have [place] follow-up with him in 1-2 weeks for any further questions/assistance.    Raynaldo Opitz, LCSW [facility]  Clinical Social Worker cell #: [phone]

## 2019-06-25 NOTE — Patient Outreach (Signed)
[facility]) Care Management  [place]   06/25/2019  [name] 11-02-1941 [id number]  Reason for referral: medication assistance  Referral source: [facility] RN Referral medication(Williams): Eliquis and Lantus Current insurance: [employer]  HPI:  History of CVA, ADD, Anxiety, Aphasia, Williams Fib, CAD, CHF, Depression, hyperlipidemia, hypothyroidism, obesity and type 2 diabetes.  Spoke with Patient'Williams husband ([name]) HIPAA identifiers were obtained. [name] expressed concern over the cost of Lantus and Eliquis.  He reported both medications have started costing >$120.    Objective: Allergies  Allergen Reactions  . Azithromycin Nausea And Vomiting  . Cardizem [Diltiazem Hcl] Nausea And Vomiting  . Codeine Other (See Comments)    Feeling weird   . Coreg [Carvedilol] Nausea And Vomiting  . Demerol [Meperidine] Other (See Comments)    Per pt disoriented  . Erythromycin Nausea And Vomiting    Very sick  . Inderal [Propranolol] Other (See Comments)    Made me cry  . Procardia [Nifedipine] Other (See Comments)    Disoriented, sick  . Wellbutrin [Bupropion] Nausea Only    Unknown reaction  . Zoloft [Sertraline Hcl] Other (See Comments)    Could not talk, stiff    Medications Reviewed Today    Reviewed by [name], [facility] (Pharmacist) on 06/25/19 at 1222  Med List Status: <None>  Medication Order Taking? Sig Documenting Provider Last Dose Status Informant  ACCU-CHEK SOFTCLIX LANCETS lancets [id number] Yes USE TO TEST FOUR TIMES DAILY Williams, Kathleen Mink, PA-C Taking Active Spouse/Significant Other           Med Note [name], [name]   Fri Nov 13, 2018  4:35 PM) Use sometimes   Alfalfa 250 MG TABS [id number] Yes Take 1 tablet by mouth every morning. [provider] Taking Active Spouse/Significant Other  aspirin EC 81 MG EC tablet [id number] Yes Take 1 tablet (81 mg total) by mouth daily. Kathleen Jansky, MD Taking Active Spouse/Significant  Other  atorvastatin (LIPITOR) 40 MG tablet [id number] Yes TAKE 1 TABLET BY MOUTH ONCE DAILY AT  Kathleen Cabal, MD Taking Active   blood glucose meter kit and supplies [id number] Yes Dispense based on patient and insurance preference. Use up to four times daily as directed. (FOR ICD-9 250.00, 250.01). Kathleen Hillock, MD Taking Active Spouse/Significant Other           Med Note [name], [name]   Fri Nov 13, 2018  4:35 PM) use  Cholecalciferol (VITAMIN D3) 5000 units TBDP [id number] Yes Take 1 capsule by mouth daily. [provider] Taking Active Spouse/Significant Other  CHROMIUM ASPARTATE PO [id number] Yes Take 1 tablet by mouth every morning. [provider] Taking Active Spouse/Significant Other  DHA-EPA-Coenzyme Q10-Vitamin E (GNP COQ-10 & FISH OIL) 120-180-50-30 CAPS [id number] Yes Take 2 tablets by mouth daily.  [provider] Taking Active Spouse/Significant Other           Med Note [name], [name]   Fri Jan 09, 2018 12:39 PM)    diazepam (VALIUM) 5 MG tablet [id number] Yes TAKE 1 TABLET BY MOUTH EVERY 12 HOURS AS NEEDED FOR MUSCLE SPASM FOR PAIN Kathleen Moron, MD Taking Active   ELIQUIS 5 MG TABS tablet [id number] Yes Take 1 tablet by mouth twice daily Williams, Mihai, MD Taking Active   fexofenadine (ALLEGRA) 180 MG tablet [id number] Yes Take 180 mg by mouth daily as needed for allergies.  [provider] Taking Active Spouse/Significant Other  glucose blood (ACCU-CHEK AVIVA  PLUS) test strip [id number] Yes USE TO TEST FOUR TIMES DAILY Williams, Kathleen Mink, PA-C Taking Active Spouse/Significant Other           Med Note [name], [name]   Fri Nov 13, 2018  4:35 PM) use  hydrochlorothiazide (HYDRODIURIL) 25 MG tablet [id number] Yes Take 1 tablet by mouth once daily Williams, Kathleen Mink, PA-C Taking Active   HYDROcodone-acetaminophen (NORCO) 10-325 MG per tablet [id number] Yes Take 1-2 tablets by mouth every 4 (four) hours as needed for moderate  pain.  [provider] Taking Active Spouse/Significant Other           Med Note [name], [name]   Tue May 19, 2018  3:02 PM)    Insulin Syringe-Needle U-100 (INSULIN SYRINGE .5CC/31GX5/16") 31G X 5/16" 0.5 ML MISC [id number] Yes Use as directed Kathleen Hillock, MD Taking Active Spouse/Significant Other           Med Note [name], [name]   Fri Nov 13, 2018  4:36 PM) use  LANTUS SOLOSTAR 100 UNIT/ML Solostar Pen [id number] Yes Inject 24 Units into the skin at bedtime. [provider] Taking Active   levothyroxine (SYNTHROID) 50 MCG tablet [id number] Yes Take 1 tablet by mouth daily. [provider] Taking Active   lisinopril (ZESTRIL) 20 MG tablet [id number] Yes TAKE 1 TABLET BY MOUTH IN THE MORNING AND 1/2 (ONE-HALF) IN THE EVENING Williams, Mihai, MD Taking Active   magnesium oxide (MAG-OX) 400 MG tablet [id number] Yes Take 400 mg by mouth 2 (two) times daily. [provider] Taking Active Spouse/Significant Other  metFORMIN (GLUCOPHAGE XR) 500 MG 24 hr tablet [id number] Yes (take with food) Week 1: take 1/2 tab twice Williams day; Week 2: take 1 tab in the morning, 1/2 tablet at night; Week 3: take 2 tabs twice Williams day. Williams, Kathleen Mink, PA-C Taking Active   metoprolol tartrate (LOPRESSOR) 50 MG tablet [id number] Yes Take 1 tablet by mouth twice daily Williams, Kathleen Bloomer, MD Taking Active   Misc. Devices ([place]) [place] [id number] Yes 1 Units by Does not apply route daily as needed. Williams, Kathleen Mink, PA-C Taking Active Spouse/Significant Other           Med Note [name], [name]   Fri Nov 13, 2018  4:40 PM) use  potassium chloride (K-DUR) 10 MEQ tablet [id number] Yes Take 1 tablet by mouth daily. [provider] Taking Active   venlafaxine XR (EFFEXOR XR) 150 MG 24 hr capsule [id number] Yes Take 1 capsule (150 mg total) by mouth daily with breakfast. Williams, Kathleen Mink, PA-C Taking Active           Assessment:  Drugs  sorted by system:  Neurologic/Psychologic: Diazepam, Venlafaxine  Cardiovascular: Aspirin, Atorvastatin, Eliquis, Lisinopril, Metoprolol,   Pulmonary/Allergy: Fexofenadine  Endocrine: Lantus, Levothyroxine, Metformin,   Pain: Hydrocodone/Acetaminophen,   Vitamins/Minerals/Supplements: Cholecalciferol, Chromium., Alfalfa, CoEnzyme Q-10/Vitamin E/Fish Oil, Magnesium Oxide, Potassium Chloride,   Miscellaneous:  Medication Review Findings:  . Increased risk of CNS depression with Hydrocodone/APAP and Diazepam . HgA1c-8.4% (12/19) . On statin-Atorvastatin (Filled 01/15/19[90 day supply] and 06/18/19 [90 day supply])  Medication Assistance Findings:  Medication assistance needs identified: Lantus   Patient is over income for Extra Help She MAY qualify to receive Lantus from [employer] Patient Assistance program. However, they require patients to spend at least $1000 in out of pocket medication expenses.    Patient'Williams husband was not sure how much she had spent but said he would get Williams print out  from their local pharmacy.   Additional medication assistance options reviewed with patient as warranted:  No other options identified  Plan: I will route patient assistance letter to [facility] technician who will coordinate patient assistance program application process for medications listed above.  [facility] pharmacy technician will assist with obtaining all required documents from both patient and provider(Williams) and submit application(Williams) once completed.    [name], PharmD, [place] Clinical Pharmacist [phone]

## 2019-06-29 ENCOUNTER — Encounter: Payer: Self-pay | Admitting: Orthopaedic Surgery

## 2019-06-29 ENCOUNTER — Ambulatory Visit (INDEPENDENT_AMBULATORY_CARE_PROVIDER_SITE_OTHER): Payer: Medicare Other | Admitting: Orthopaedic Surgery

## 2019-06-29 ENCOUNTER — Ambulatory Visit (INDEPENDENT_AMBULATORY_CARE_PROVIDER_SITE_OTHER): Payer: Medicare Other

## 2019-06-29 ENCOUNTER — Other Ambulatory Visit: Payer: Self-pay

## 2019-06-29 DIAGNOSIS — M25532 Pain in left wrist: Secondary | ICD-10-CM

## 2019-06-29 DIAGNOSIS — S62102A Fracture of unspecified carpal bone, left wrist, initial encounter for closed fracture: Secondary | ICD-10-CM
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
## 2019-07-04 IMAGING — CT CT HEAD CODE STROKE
4 series · 15 of 47 positions shown, 17 images · non-contrast
Comparison: Prior CT from 09/25/2017.

CLINICAL DATA: Code stroke.  Initial evaluation for acute aphasia.

EXAM:
CT HEAD WITHOUT CONTRAST
TECHNIQUE: Contiguous axial images were obtained from the base of the skull
through the vertex without intravenous contrast.

[Series 3: head wo · axial · 0.42mm/px · z∈[-174,-54]mm · 7 of 33 slices shown, 9 images]
[im 5/33  brain]
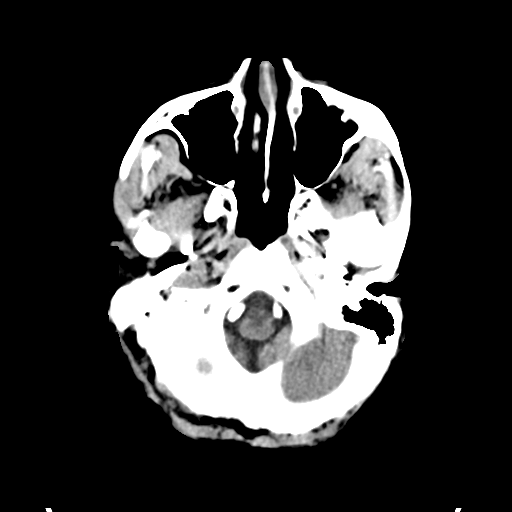
[im 5/33  bone]
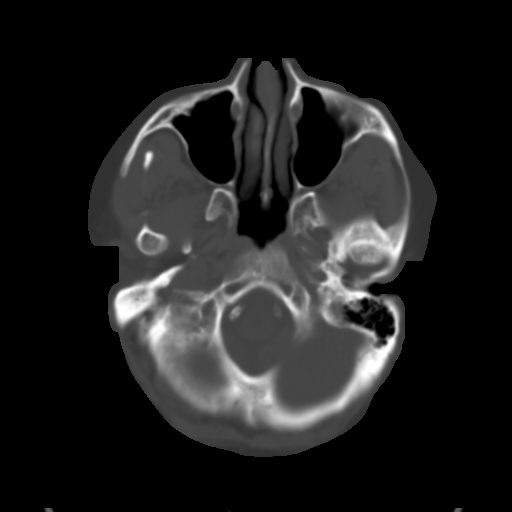
[im 9/33  brain]
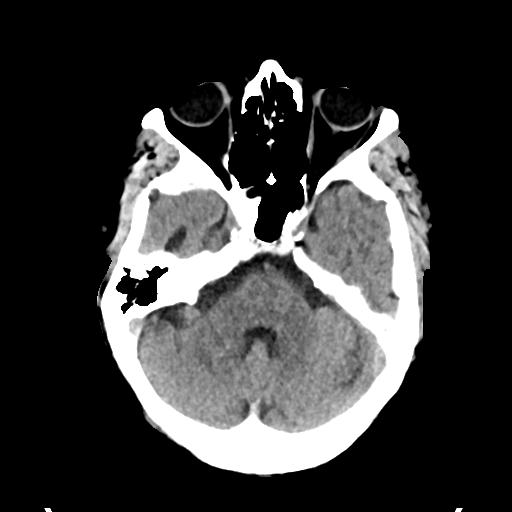
[im 13/33  brain]
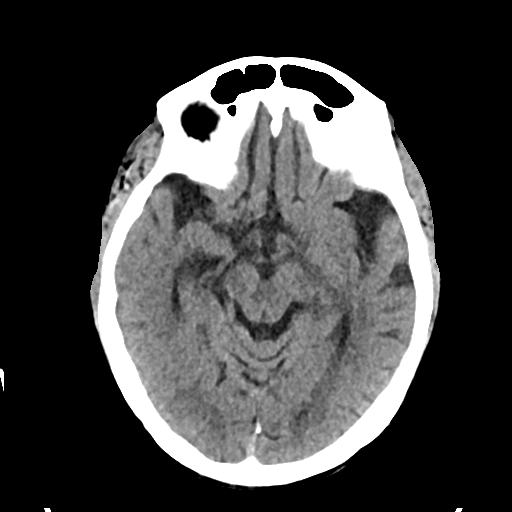
[im 17/33  brain]
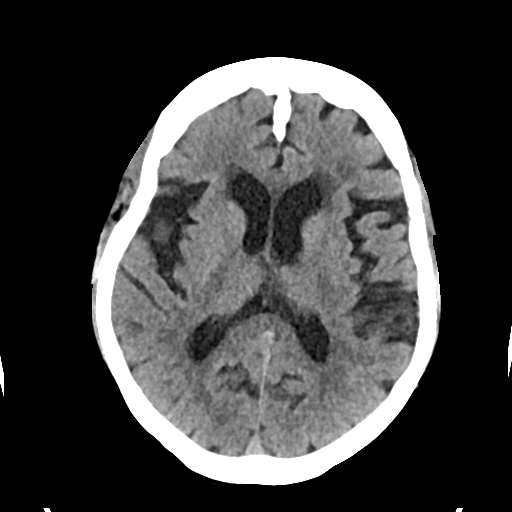
[im 21/33  brain]
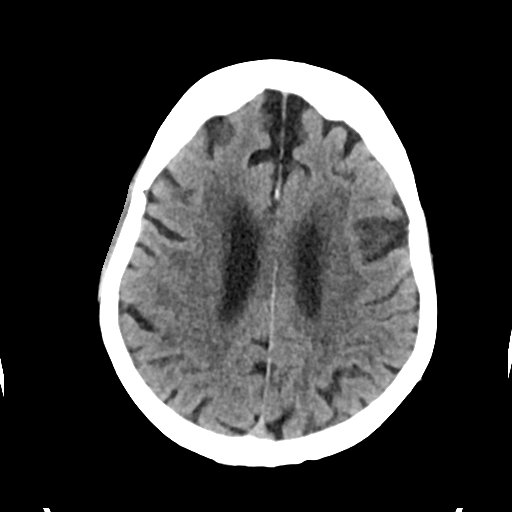
[im 21/33  bone]
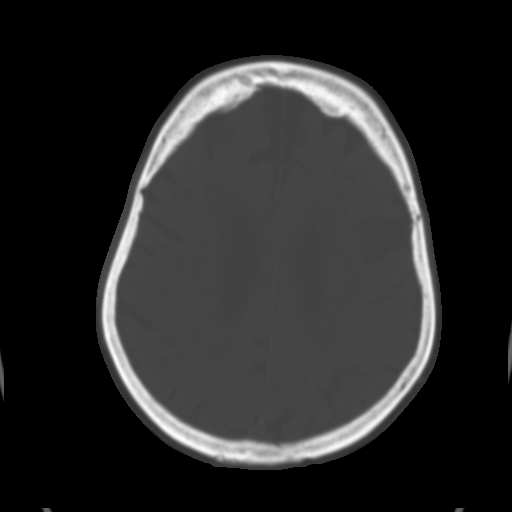
[im 25/33  brain]
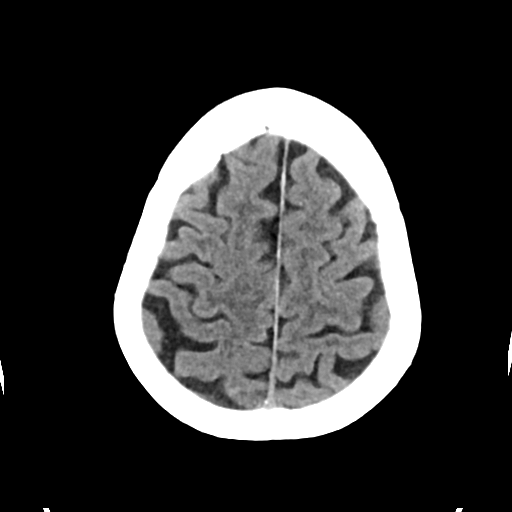
[im 29/33  brain]
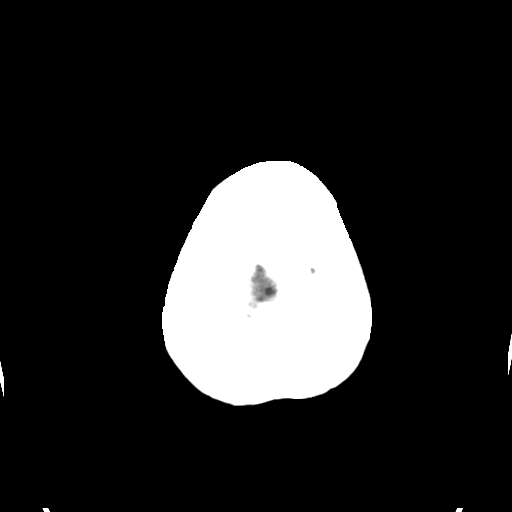

[Series 4: head bone · axial · 0.42mm/px · z∈[-178,-162]mm · 2 of 81 slices shown]
[im 9/81  bone]
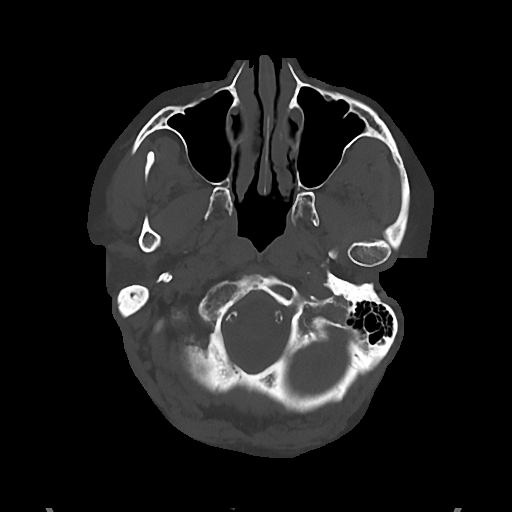
[im 17/81  bone]
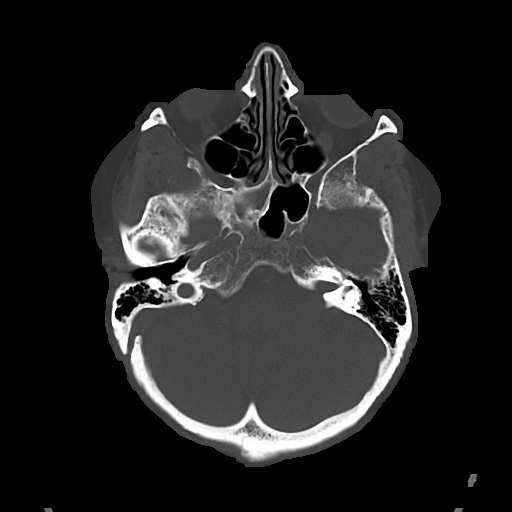

[Series 5: cor soft · coronal · 0.31mm/px · 3 of 67 slices shown]
[im 23/67  brain]
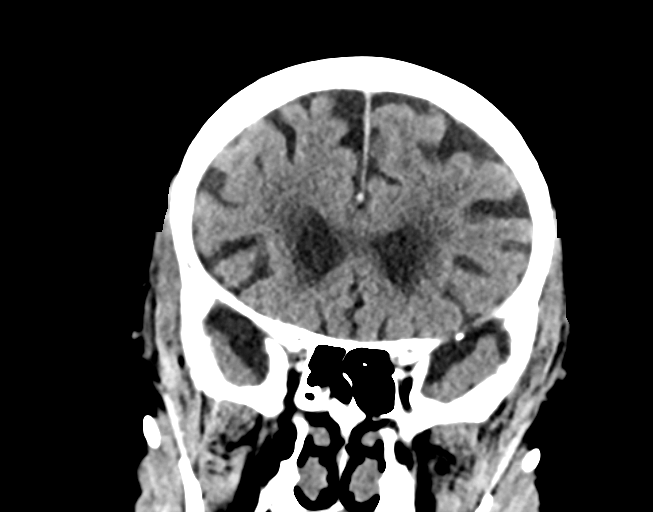
[im 30/67  brain]
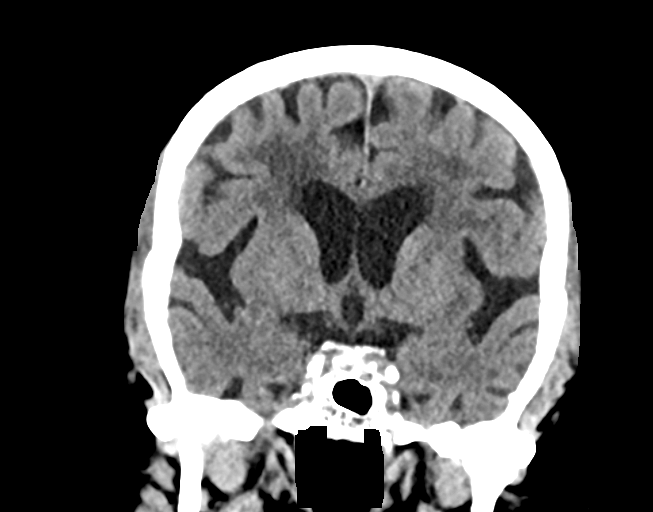
[im 37/67  brain]
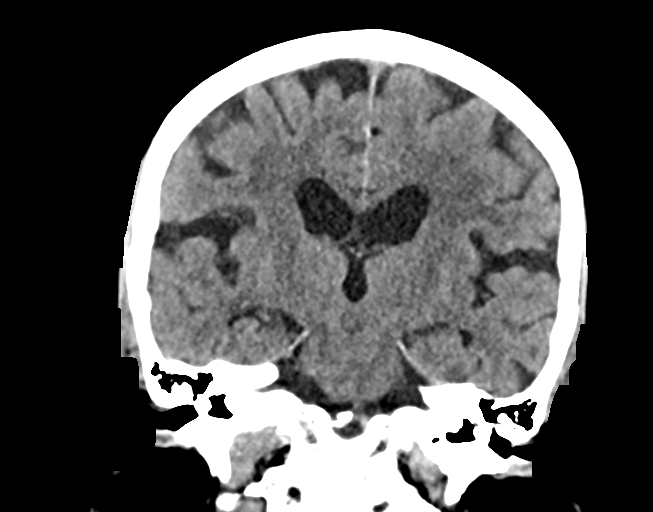

[Series 6: sag soft · sagittal · 0.31mm/px · 3 of 66 slices shown]
[im 22/66  brain]
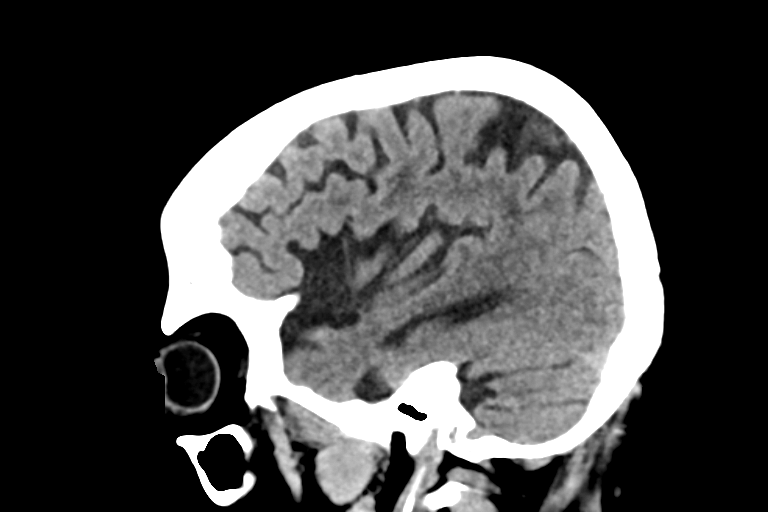
[im 33/66  brain]
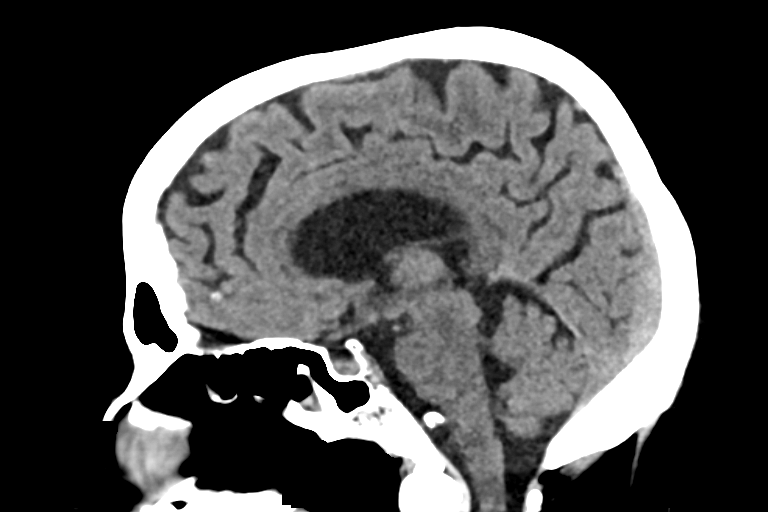
[im 44/66  brain]
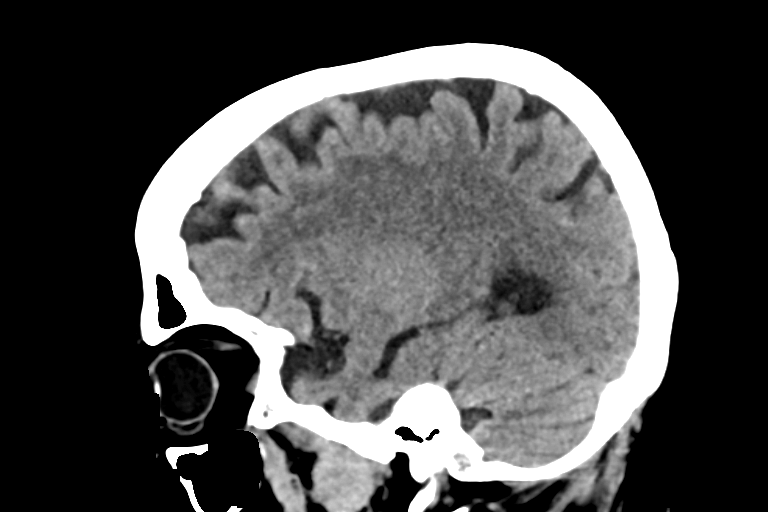

[15 of 47 positions shown; findings below may reference images not displayed]

FINDINGS: Brain: Age-related cerebral atrophy with chronic microvascular
ischemic disease. No acute intracranial hemorrhage. No acute large
vessel territory infarct. No mass lesion, midline shift or mass
effect. No hydrocephalus. No extra-axial fluid collection.

Vascular: No hyperdense vessel. Calcified atherosclerosis at the
skull base.

Skull: Scalp soft tissues demonstrate no acute abnormality.
Calvarium intact.

Sinuses/Orbits: Globes and orbital soft tissues demonstrate no acute
finding. Chronic right sphenoid sinusitis noted. Small right mastoid
effusion.

Other: None.

ASPECTS ([place]berta Stroke Program Early CT Score)

- Ganglionic level infarction (caudate, lentiform nuclei, internal
capsule, insula, M1-M3 cortex): 7

- Supraganglionic infarction (M4-M6 cortex): 3

Total score (0-10 with 10 being normal): 10
IMPRESSION: 1. No acute intracranial infarct or other abnormality identified.
2. ASPECTS is 10.
3. Age-related cerebral atrophy with chronic small vessel ischemic
disease, stable.

These results were communicated to [place] at [DATE] [place] 05/19/2018by
text page via the AM[employer] messaging system.

## 2019-07-06 ENCOUNTER — Encounter: Payer: Self-pay | Admitting: *Deleted

## 2019-07-06 ENCOUNTER — Other Ambulatory Visit: Payer: Self-pay | Admitting: *Deleted

## 2019-07-06 NOTE — Patient Outreach (Signed)
[facility]) Care Management  07/06/2019  [name] 11/02/1941 [id number]   CSW was able to make contact with patient's husband, [name] today to follow-up regarding social work services and resources for patient, as well as to ensure that [name] received the packet of resource information mailed to his home by CSW.  [name] admitted to receiving the following packet of resource information: IN-HOME CARE [place], MEDICAL CONDITION, OR DISABILITY IN [place], [place] [name] denied having had an opportunity to review the information, admitting that it has really not been a priority for him.    CSW agreed to review the information with [name], while conversing over the phone, but [name] indicated that he doubts that he will even follow-up with arranging in-home care services for patient.  [name] went on to say that he is better able to manage patient's care in the home, not wanting to have to pay someone to perform activities that he is capable of doing himself.  [name] added that patient is really not on a routine at home, sometimes sleeping until 11AM, taking a nap for several hours around 2PM, and then going to bed for the evening around 7PM.  Therefore, [name] admitted that in-home care services would probably not even be necessary for patient because he would not know when to even arrange the services.    CSW voiced understanding, encouraging [name] to contact CSW directly if he changes his mind, or if additional social work needs arise in the near future.  [name] was agreeable to this plan.  CSW will perform a case closure on patient, as all goals of treatment have been met from social work standpoint and no additional social work needs have been identified at this time.  CSW will notify patient's RNCM with [place] [facility], [name]  of CSW's plans to close patient's case.  CSW will fax an update to patient's Primary Care Physician, Dr. Agustina Caroli to ensure that they are aware of CSW's involvement with patient's plan of care.    Nat Christen, BSW, MSW, LCSW  Licensed Education officer, [facility]  Mailing [place] [address] [place], [place] [address] Physical Address-[address], [place], [place] [address] Toll Free Main # [phone] Fax # [phone] Cell # [phone]  Office # [phone] [email or web address]

## 2019-07-07 ENCOUNTER — Other Ambulatory Visit: Payer: Self-pay | Admitting: Physician Assistant

## 2019-07-10 ENCOUNTER — Other Ambulatory Visit: Payer: Self-pay | Admitting: Emergency Medicine

## 2019-07-10 DIAGNOSIS — I10 Essential (primary) hypertension: Secondary | ICD-10-CM

## 2019-07-19 ENCOUNTER — Other Ambulatory Visit: Payer: Self-pay | Admitting: Family Medicine

## 2019-07-19 DIAGNOSIS — F411 Generalized anxiety disorder: Secondary | ICD-10-CM

## 2019-07-19 NOTE — Telephone Encounter (Signed)
Requested medication (s) are due for refill today: yes  Requested medication (s) are on the active medication list: yes  Last refill:  05/11/2019  Future visit scheduled: no  Notes to clinic:This refill cannot be delegated    Requested Prescriptions  Pending Prescriptions Disp Refills   diazepam (VALIUM) 5 MG tablet [Pharmacy Med Name: diazePAM 5 MG Oral Tablet] 60 tablet 0    Sig: TAKE 1 TABLET BY MOUTH EVERY 12 HOURS AS NEEDED FOR MUSCLE SPASM FOR PAIN     Not Delegated - Psychiatry:  Anxiolytics/Hypnotics Failed - 07/19/2019 10:40 AM      Failed - This refill cannot be delegated      Failed - Urine Drug Screen completed in last 360 days.      Passed - Valid encounter within last 6 months    Recent Outpatient Visits          4 months ago Physical deconditioning   Primary Care at [facility], [place], [place]   8 months ago Uncontrolled type 2 diabetes mellitus with hyperglycemia [facility])   Primary Care at [facility], [place], MD   9 months ago Type 2 diabetes mellitus with other specified complication, without long-term current use of insulin [facility])   Primary Care at [facility], Gelene Mink, PA-C   1 year ago Adjustment disorder with mixed anxiety and depressed mood   Primary Care at [facility], Gelene Mink, PA-C   1 year ago Adjustment disorder with mixed anxiety and depressed mood   Primary Care at [facility], Gelene Mink, PA-C      Future Appointments            In 1 week Durward Fortes, Vonna Kotyk, MD [facility].   In 2 weeks Croitoru, Dani Gobble, MD [facility], [facility]

## 2019-07-21 ENCOUNTER — Other Ambulatory Visit: Payer: Self-pay

## 2019-07-21 ENCOUNTER — Encounter: Payer: Self-pay | Admitting: *Deleted

## 2019-07-21 ENCOUNTER — Other Ambulatory Visit: Payer: Self-pay | Admitting: *Deleted

## 2019-07-21 NOTE — Patient Outreach (Signed)
[name]) Care Management  07/21/2019   KEUNDRA PETRUCELLI 1940/12/05 [id number]  Subjective: Successful outreach.  Two patient identifiers obtained.  Spoke with [name] (spouse caregiver and  On ROI).  Caregiver states that the patient has not been doing well.  He states that last weak the patient was sitting up in the bed and then all of a sudden she lost control of her body.  Before he could get back with the walker she had rolled out the bed on her knees.  She did not injure herself.  He states that he does not know what is going on with her.  I asked the caregiver had he called his physician to let them know.  He states that he called last weak and has not heard anything.  Advised the caregiver to call back to let them know. He verbalized understanding.    He states that her blood sugars are ranging 150-170.  This morning it was 163.  Her appetite is not like it use to be and she no longer drinks supplemental drinks. Caregiver states that he needs help in the home.  His wife likes him to be with her all the time.  He states that he needs to leave to go get medication, groceries and  other things.   Caregiver state his son will come and help after he gets off work when he can but other than that he has no help. Discussed with him about in home care and the process.  Advised him that I will call Nat Christen BSW, MSW, LCSW and inform her of the situations.  Caregiver was very appreciative for the call.   Current Medications:  Current Outpatient Medications  Medication Sig Dispense Refill  . ACCU-CHEK SOFTCLIX LANCETS lancets USE TO TEST FOUR TIMES DAILY 100 each 5  . Alfalfa 250 MG TABS Take 1 tablet by mouth every morning.    Marland Kitchen aspirin EC 81 MG EC tablet Take 1 tablet (81 mg total) by mouth daily. 30 tablet 0  . atorvastatin (LIPITOR) 40 MG tablet TAKE 1 TABLET BY MOUTH ONCE DAILY AT  6PM 90 tablet 0  . blood glucose meter kit and supplies Dispense based on patient and  insurance preference. Use up to four times daily as directed. (FOR ICD-9 250.00, 250.01). 1 each 0  . Cholecalciferol (VITAMIN D3) 5000 units TBDP Take 1 capsule by mouth daily.    . CHROMIUM ASPARTATE PO Take 1 tablet by mouth every morning.    Marland Kitchen DHA-EPA-Coenzyme Q10-Vitamin E (GNP COQ-10 & FISH OIL) 120-180-50-30 CAPS Take 2 tablets by mouth daily.     . diazepam (VALIUM) 5 MG tablet TAKE 1 TABLET BY MOUTH EVERY 12 HOURS AS NEEDED FOR MUSCLE SPASM FOR PAIN 60 tablet 0  . ELIQUIS 5 MG TABS tablet Take 1 tablet by mouth twice daily 60 tablet 0  . fexofenadine (ALLEGRA) 180 MG tablet Take 180 mg by mouth daily as needed for allergies.     Marland Kitchen glucose blood (ACCU-CHEK AVIVA PLUS) test strip USE TO TEST FOUR TIMES DAILY 100 each 0  . hydrochlorothiazide (HYDRODIURIL) 25 MG tablet Take 1 tablet by mouth once daily 90 tablet 0  . HYDROcodone-acetaminophen (NORCO) 10-325 MG per tablet Take 1-2 tablets by mouth every 4 (four) hours as needed for moderate pain.     . Insulin Syringe-Needle U-100 (INSULIN SYRINGE .5CC/31GX5/16") 31G X 5/16" 0.5 ML MISC Use as directed 100 each 0  . LANTUS SOLOSTAR 100 UNIT/ML Solostar  Pen Inject 24 Units into the skin at bedtime.    Marland Kitchen levothyroxine (SYNTHROID) 50 MCG tablet Take 1 tablet by mouth daily.    Marland Kitchen lisinopril (ZESTRIL) 20 MG tablet TAKE 1 TABLET BY MOUTH IN THE MORNING AND 1/2 (ONE-HALF) IN THE EVENING 135 tablet 1  . magnesium oxide (MAG-OX) 400 MG tablet Take 400 mg by mouth 2 (two) times daily.    . metFORMIN (GLUCOPHAGE XR) 500 MG 24 hr tablet (take with food) Week 1: take 1/2 tab twice a day; Week 2: take 1 tab in the morning, 1/2 tablet at night; Week 3: take 2 tabs twice a day. 180 tablet 2  . metoprolol tartrate (LOPRESSOR) 50 MG tablet Take 1 tablet by mouth twice daily 60 tablet 1  . Misc. Devices (HUGO ROLLING WALKER ELITE) MISC 1 Units by Does not apply route daily as needed. 1 each 0  . potassium chloride (K-DUR) 10 MEQ tablet Take 1 tablet by mouth  daily.    Marland Kitchen venlafaxine XR (EFFEXOR XR) 150 MG 24 hr capsule Take 1 capsule (150 mg total) by mouth daily with breakfast. 90 capsule 3   No current facility-administered medications for this visit.     Functional Status:  In your present state of health, do you have any difficulty performing the following activities: 06/16/2019 06/14/2019  Hearing? N N  Vision? Y N  Difficulty concentrating or making decisions? Y Y  Comment due to stoke Minimal - due to history of Stroke.  Walking or climbing stairs? Y Y  Comment due to stroke History of Stroke.  Dressing or bathing? Tempie Donning  Comment stroke History of Stroke.  Doing errands, shopping? Tempie Donning  Comment stroke History of Stroke.  Preparing Food and eating ? - Y  Using the Toilet? - Y  In the past six months, have you accidently leaked urine? - Y  Do you have problems with loss of bowel control? - N  Managing your Medications? - Y  Managing your Finances? - Y  Housekeeping or managing your Housekeeping? - Y  Some recent data might be hidden    Fall/Depression Screening: Fall Risk  07/21/2019 06/16/2019 06/14/2019  Falls in the past year? 1 - 1  Comment - - -  Number falls in past yr: 0 1 0  Injury with Fall? 0 1 0  Comment - fell broke her left wrist -  Risk Factor Category  - - -  Risk for fall due to : History of fall(s);Impaired balance/gait;Impaired mobility History of fall(s);Impaired balance/gait History of fall(s);Impaired balance/gait;Impaired mobility  Follow up Falls evaluation completed;Education provided Falls evaluation completed;Education provided Education provided;Falls prevention discussed   PHQ 2/9 Scores 06/16/2019 06/14/2019 11/13/2018 10/02/2018 09/30/2018 04/22/2018 04/01/2018  PHQ - 2 Score 2 0 0 2 1 0 0  PHQ- 9 Score 10 - - - - - -  Exception Documentation - Other- indicate reason in comment box - - - - -  Not completed - Assessment completed by patient's husband who reports that patient has a history of depression, but  does not currently suffer from symptoms. - - - - -    Assessment: Patient will continue to benefit from health coach outreach for disease management and support. THN CM Care Plan Problem One     Most Recent Value  Care Plan for Problem One  Active  THN Long Term Goal   In 90 days the cargiver will be able to verbalize lowering her a1c from 1-2 points from 8.4.  THN Long Term Goal Start Date  07/21/19  Interventions for Problem One Long Term Goal  Dicussed FBS, dicussed diet, encouraged mediccation adherence, encourged the careigver to call and inform the physician of her progress and issues, encouraged the caregiver to talk with the social worker for inhome care and understanding the process.     Plan: RN Health Coach will contact patient in the month of September and patient agrees to next outreach.  RN Health Coach will call next month to follow up on patient progress.  [name] RN, BSN, [place] Direct Dial:  [phone]  Fax: [phone]

## 2019-07-21 NOTE — Patient Outreach (Signed)
[facility]) Care Management  07/21/2019  GARRIE ELENES 24-May-1941 [id number]   CSW received a call from [name], [facility] Disease Management/Health Coach with [name], encouraging CSW to contact patient's husband and primary caregiver, Raechel Marcos to discuss arranging in-home care services for patient.  CSW has made several attempts to try and contact Mr. Romberger on his home phone number [phone]), as well as his cell phone number [phone]), without success.  CSW was able to leave a HIPAA compliant message for Mr. Heathcock on his home phone, but his cell phone number continues to be busy.  CSW will make a second outreach attempt within the next 3-4 business days, if a return call is not received from Mr. Virden in the meantime.  CSW will also mail an outreach letter to Mr. Kupfer home, encouraging Mr. Schmader to contact CSW directly if he is interested in receiving social work services through [place] with [facility].  Nat Christen, BSW, MSW, LCSW  Licensed Education officer, [facility]  Mailing [place] [address], [place], [place] [address] Physical Address-[address], [place], [place] [address] Toll Free Main # [phone] Fax # [phone] Cell # [phone]  Office # [phone] [email or web address]

## 2019-07-23 NOTE — Telephone Encounter (Signed)
Patient called back in concerning the medication that prescribed on 05/11/19 by Dr Nolon Rod they would like a call back   [address]   Please review

## 2019-07-24 ENCOUNTER — Other Ambulatory Visit: Payer: Self-pay

## 2019-07-24 ENCOUNTER — Encounter (HOSPITAL_COMMUNITY): Payer: Self-pay

## 2019-07-24 ENCOUNTER — Emergency Department (HOSPITAL_COMMUNITY)
Admission: EM | Admit: 2019-07-24 | Discharge: 2019-07-24 | Disposition: A | Discharge status: Home / Self Care | Payer: Medicare Other | Attending: Emergency Medicine | Admitting: Emergency Medicine

## 2019-07-24 ENCOUNTER — Emergency Department (HOSPITAL_COMMUNITY): Payer: Medicare Other

## 2019-07-24 DIAGNOSIS — W19XXXA Unspecified fall, initial encounter: Secondary | ICD-10-CM

## 2019-07-24 DIAGNOSIS — R51 Headache: Secondary | ICD-10-CM | POA: Diagnosis not present

## 2019-07-24 DIAGNOSIS — I959 Hypotension, unspecified: Secondary | ICD-10-CM | POA: Diagnosis not present

## 2019-07-24 DIAGNOSIS — Y999 Unspecified external cause status: Secondary | ICD-10-CM | POA: Diagnosis not present

## 2019-07-24 DIAGNOSIS — Y939 Activity, unspecified: Secondary | ICD-10-CM | POA: Diagnosis not present

## 2019-07-24 DIAGNOSIS — W01190A Fall on same level from slipping, tripping and stumbling with subsequent striking against furniture, initial encounter: Secondary | ICD-10-CM | POA: Insufficient documentation

## 2019-07-24 DIAGNOSIS — M542 Cervicalgia: Secondary | ICD-10-CM | POA: Diagnosis not present

## 2019-07-24 DIAGNOSIS — Z79899 Other long term (current) drug therapy: Secondary | ICD-10-CM | POA: Diagnosis not present

## 2019-07-24 DIAGNOSIS — S79911A Unspecified injury of right hip, initial encounter: Secondary | ICD-10-CM | POA: Diagnosis not present

## 2019-07-24 DIAGNOSIS — Z7982 Long term (current) use of aspirin: Secondary | ICD-10-CM | POA: Insufficient documentation

## 2019-07-24 DIAGNOSIS — I10 Essential (primary) hypertension: Secondary | ICD-10-CM | POA: Insufficient documentation

## 2019-07-24 DIAGNOSIS — M7918 Myalgia, other site: Secondary | ICD-10-CM | POA: Diagnosis not present

## 2019-07-24 DIAGNOSIS — S0990XA Unspecified injury of head, initial encounter: Secondary | ICD-10-CM | POA: Diagnosis not present

## 2019-07-24 DIAGNOSIS — I2510 Atherosclerotic heart disease of native coronary artery without angina pectoris: Secondary | ICD-10-CM | POA: Diagnosis not present

## 2019-07-24 DIAGNOSIS — Z794 Long term (current) use of insulin: Secondary | ICD-10-CM | POA: Insufficient documentation

## 2019-07-24 DIAGNOSIS — Y92003 Bedroom of unspecified non-institutional (private) residence as the place of occurrence of the external cause: Secondary | ICD-10-CM | POA: Diagnosis not present

## 2019-07-24 DIAGNOSIS — Z7901 Long term (current) use of anticoagulants: Secondary | ICD-10-CM | POA: Diagnosis not present
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
## 2019-07-24 NOTE — ED Notes (Signed)
Per pt husband, pt ambulates with walker at baseline. Pt unsteady on feet x2 assist, pt left side very weak. Unable to bear weight on left leg. Pt states "I feel like I forgot how to walk, my knees feel weak."

## 2019-07-24 NOTE — ED Triage Notes (Signed)
Patient arrived via [facility]   Patient was reportedly sitting on bed and when she got up she hit the top of her head on the doorknob.  Patient on Eliquis, history of a fib.  Left sided weakness is her baseline from previous stroke.

## 2019-07-24 NOTE — Discharge Instructions (Signed)
Please read and follow all provided instructions.  Your diagnoses today include:  1. Minor head injury, initial encounter   2. Musculoskeletal pain   3. Fall, initial encounter     Tests performed today include:  CT scan of your head, neck that did not show any serious injury.  Vital signs. See below for your results today.   Medications prescribed:   None  Take any prescribed medications only as directed.  Home care instructions:  Follow any educational materials contained in this packet.  I have placed orders to have our nurse case manager look into home health for you.  Follow-up instructions: Please follow-up with your primary care provider in the next 3 days for further evaluation of your symptoms.   Return instructions:  SEEK IMMEDIATE MEDICAL ATTENTION IF:  There is confusion or drowsiness (although children frequently become drowsy after injury).   You cannot awaken the injured person.   You have more than one episode of vomiting.   You notice dizziness or unsteadiness which is getting worse, or inability to walk.   You have convulsions or unconsciousness.   You experience severe, persistent headaches not relieved by Tylenol.  You cannot use arms or legs normally.   There are changes in pupil sizes. (This is the black center in the colored part of the eye)   There is clear or bloody discharge from the nose or ears.   You have change in speech, vision, swallowing, or understanding.   Localized weakness, numbness, tingling, or change in bowel or bladder control.  You have any other emergent concerns.  Your vital signs today were: BP (!) 132/58    Pulse 78    Temp 98.5 F (36.9 C) (Oral)    Resp 18    SpO2 98%  If your blood pressure (BP) was elevated above 135/85 this visit, please have this repeated by your doctor within one month. --------------

## 2019-07-24 NOTE — ED Provider Notes (Signed)
7:25 PM signout from Jackson PA-C at shift change.  Patient with negative head CT, cervical spine CT, hip x-ray after a fall today.  Patient is been eating in the room.  She is unsteady on her feet.  Typically she walks with a walker however per her husband, even recently has been only getting around by him pushing her on the walker.  She has frequent falls and recently broke her left hip over the past few months.  Stability, generalized weakness and fatigue have been an big issue for her.  Nothing is acutely changed however.  Discussed results with patient and husband at bedside.  They are comfortable discharged home.  I have placed home health orders and a nurse case manager consult.  Currently the patient's husband helps with ADLs and cares for her entirely by himself at home.  She would likely benefit from home health services if these were possible to obtain.  BP (!) 132/58   Pulse 78   Temp 98.5 F (36.9 C) (Oral)   Resp 18   SpO2 98%     Carlisle Cater, PA-C 07/24/19 Joan Mayans, MD 07/25/19 [phone]

## 2019-07-26 ENCOUNTER — Emergency Department (HOSPITAL_COMMUNITY): Payer: Medicare Other

## 2019-07-26 ENCOUNTER — Other Ambulatory Visit: Payer: Self-pay | Admitting: *Deleted

## 2019-07-26 ENCOUNTER — Other Ambulatory Visit: Payer: Self-pay

## 2019-07-26 ENCOUNTER — Emergency Department (HOSPITAL_COMMUNITY)
Admission: EM | Admit: 2019-07-26 | Discharge: 2019-07-26 | Disposition: A | Discharge status: Home / Self Care | Payer: Medicare Other | Attending: Emergency Medicine | Admitting: Emergency Medicine

## 2019-07-26 DIAGNOSIS — F0390 Unspecified dementia without behavioral disturbance: Secondary | ICD-10-CM | POA: Insufficient documentation

## 2019-07-26 DIAGNOSIS — E1165 Type 2 diabetes mellitus with hyperglycemia: Secondary | ICD-10-CM | POA: Diagnosis not present

## 2019-07-26 DIAGNOSIS — R531 Weakness: Secondary | ICD-10-CM

## 2019-07-26 DIAGNOSIS — I2510 Atherosclerotic heart disease of native coronary artery without angina pectoris: Secondary | ICD-10-CM | POA: Insufficient documentation

## 2019-07-26 DIAGNOSIS — E039 Hypothyroidism, unspecified: Secondary | ICD-10-CM | POA: Diagnosis not present

## 2019-07-26 DIAGNOSIS — Z7901 Long term (current) use of anticoagulants: Secondary | ICD-10-CM | POA: Insufficient documentation

## 2019-07-26 DIAGNOSIS — I10 Essential (primary) hypertension: Secondary | ICD-10-CM | POA: Insufficient documentation

## 2019-07-26 DIAGNOSIS — E119 Type 2 diabetes mellitus without complications: Secondary | ICD-10-CM | POA: Diagnosis not present

## 2019-07-26 DIAGNOSIS — R410 Disorientation, unspecified: Secondary | ICD-10-CM | POA: Insufficient documentation

## 2019-07-26 DIAGNOSIS — R079 Chest pain, unspecified: Secondary | ICD-10-CM | POA: Diagnosis not present

## 2019-07-26 DIAGNOSIS — I4891 Unspecified atrial fibrillation: Secondary | ICD-10-CM | POA: Insufficient documentation

## 2019-07-26 DIAGNOSIS — R5383 Other fatigue: Secondary | ICD-10-CM | POA: Diagnosis present

## 2019-07-26 DIAGNOSIS — R0602 Shortness of breath: Secondary | ICD-10-CM | POA: Diagnosis not present

## 2019-07-26 DIAGNOSIS — I5032 Chronic diastolic (congestive) heart failure: Secondary | ICD-10-CM | POA: Insufficient documentation

## 2019-07-26 DIAGNOSIS — W19XXXA Unspecified fall, initial encounter: Secondary | ICD-10-CM | POA: Diagnosis not present

## 2019-07-26 DIAGNOSIS — Z794 Long term (current) use of insulin: Secondary | ICD-10-CM | POA: Diagnosis not present

## 2019-07-26 DIAGNOSIS — I6782 Cerebral ischemia: Secondary | ICD-10-CM | POA: Diagnosis not present

## 2019-07-26 DIAGNOSIS — Z8669 Personal history of other diseases of the nervous system and sense organs: Secondary | ICD-10-CM | POA: Diagnosis not present

## 2019-07-26 LAB — URINALYSIS, ROUTINE W REFLEX MICROSCOPIC
Bilirubin Urine: NEGATIVE
Glucose, UA: >=500 mg/dL — AB
Hgb urine dipstick: NEGATIVE
Ketones, ur: NEGATIVE mg/dL
Nitrite: NEGATIVE
Protein, ur: NEGATIVE mg/dL
Specific Gravity, Urine: 1.014 (ref 1.005–1.030)
pH: 5 (ref 5.0–8.0)

## 2019-07-26 LAB — CBC WITH DIFFERENTIAL/PLATELET
Abs Immature Granulocytes: 0.02 10*3/uL (ref 0.00–0.07)
Basophils Absolute: 0.1 10*3/uL (ref 0.0–0.1)
Basophils Relative: 1 %
Eosinophils Absolute: 0.1 10*3/uL (ref 0.0–0.5)
Eosinophils Relative: 2 %
HCT: 36.2 % (ref 36.0–46.0)
Hemoglobin: 11.4 g/dL — ABNORMAL LOW (ref 12.0–15.0)
Immature Granulocytes: 0 %
Lymphocytes Relative: 31 %
Lymphs Abs: 1.9 10*3/uL (ref 0.7–4.0)
MCH: 26.5 pg (ref 26.0–34.0)
MCHC: 31.5 g/dL (ref 30.0–36.0)
MCV: 84.2 fL (ref 80.0–100.0)
Monocytes Absolute: 0.6 10*3/uL (ref 0.1–1.0)
Monocytes Relative: 9 %
Neutro Abs: 3.3 10*3/uL (ref 1.7–7.7)
Neutrophils Relative %: 57 %
Platelets: 254 10*3/uL (ref 150–400)
RBC: 4.3 MIL/uL (ref 3.87–5.11)
RDW: 14 % (ref 11.5–15.5)
WBC: 5.9 10*3/uL (ref 4.0–10.5)
nRBC: 0 % (ref 0.0–0.2)

## 2019-07-26 LAB — COMPREHENSIVE METABOLIC PANEL
ALT: 21 U/L (ref 0–44)
AST: 42 U/L — ABNORMAL HIGH (ref 15–41)
Albumin: 3.2 g/dL — ABNORMAL LOW (ref 3.5–5.0)
Alkaline Phosphatase: 93 U/L (ref 38–126)
Anion gap: 13 (ref 5–15)
BUN: 24 mg/dL — ABNORMAL HIGH (ref 8–23)
CO2: 23 mmol/L (ref 22–32)
Calcium: 9.3 mg/dL (ref 8.9–10.3)
Chloride: 96 mmol/L — ABNORMAL LOW (ref 98–111)
Creatinine, Ser: 1.32 mg/dL — ABNORMAL HIGH (ref 0.44–1.00)
GFR calc Af Amer: 44 mL/min — ABNORMAL LOW (ref >60)
GFR calc non Af Amer: 38 mL/min — ABNORMAL LOW (ref >60)
Glucose, Bld: 333 mg/dL — ABNORMAL HIGH (ref 70–99)
Potassium: 4.7 mmol/L (ref 3.5–5.1)
Sodium: 132 mmol/L — ABNORMAL LOW (ref 135–145)
Total Bilirubin: 1.3 mg/dL — ABNORMAL HIGH (ref 0.3–1.2)
Total Protein: 6 g/dL — ABNORMAL LOW (ref 6.5–8.1)

## 2019-07-26 LAB — POCT I-STAT EG7
Acid-Base Excess: 6 mmol/L — ABNORMAL HIGH (ref 0.0–2.0)
Bicarbonate: 29.7 mmol/L — ABNORMAL HIGH (ref 20.0–28.0)
Calcium, Ion: 1.14 mmol/L — ABNORMAL LOW (ref 1.15–1.40)
HCT: 35 % — ABNORMAL LOW (ref 36.0–46.0)
Hemoglobin: 11.9 g/dL — ABNORMAL LOW (ref 12.0–15.0)
O2 Saturation: 99 %
Potassium: 4.9 mmol/L (ref 3.5–5.1)
Sodium: 133 mmol/L — ABNORMAL LOW (ref 135–145)
TCO2: 31 mmol/L (ref 22–32)
pCO2, Ven: 39.8 mmHg — ABNORMAL LOW (ref 44.0–60.0)
pH, Ven: 7.481 — ABNORMAL HIGH (ref 7.250–7.430)
pO2, Ven: 139 mmHg — ABNORMAL HIGH (ref 32.0–45.0)

## 2019-07-26 LAB — CBG MONITORING, ED
Glucose-Capillary: 397 mg/dL — ABNORMAL HIGH (ref 70–99)
Glucose-Capillary: 225 mg/dL — ABNORMAL HIGH (ref 70–99)

## 2019-07-26 LAB — TSH: TSH: 0.628 u[IU]/mL (ref 0.350–4.500)

## 2019-07-26 LAB — T4, FREE: Free T4: 1.14 ng/dL — ABNORMAL HIGH (ref 0.61–1.12)

## 2019-07-26 MED ORDER — SODIUM CHLORIDE 0.9 % IV BOLUS
1000.0000 mL | Freq: Once | INTRAVENOUS | Status: AC
  Administered 2019-07-26: 18:00:00 1000 mL via INTRAVENOUS

## 2019-07-26 NOTE — ED Notes (Signed)
Pt discharged from ED; instructions provided; Pt encouraged to return to ED if symptoms worsen and to f/u with PCP; Pt verbalized understanding of all instructions 

## 2019-07-26 NOTE — ED Provider Notes (Signed)
Received patient in signout from Dr. Tyrone Nine.  Refer to provider note for full history and physical examination.  Briefly, patient is a 78 year old female coming from home with complaint of fatigue.  She reports that she feels fine and that her husband sent her here for no reason.  Dr. Tyrone Nine spoke with the patient's husband who noted that she had an episode of dysarthria and left upper extremity weakness which has since improved/resolved.  She has a history of the same previously.  She did fall 2 days ago and was evaluated for this in the ED with reassuring work-up.  Pending head CT to rule out delayed intracranial hemorrhage and UA to rule out UTI.  Likely plan for discharge home.  Physical Exam  BP 111/81   Pulse 78   Temp 99.1 F (37.3 C) (Oral)   Resp 10   SpO2 97%   Physical Exam Vitals signs and nursing note reviewed.  Constitutional:      General: She is not in acute distress.    Appearance: She is well-developed.     Comments: Resting comfortably in bed  HENT:     Head: Normocephalic and atraumatic.  Eyes:     General:        Right eye: No discharge.        Left eye: No discharge.     Conjunctiva/sclera: Conjunctivae normal.  Neck:     Vascular: No JVD.     Trachea: No tracheal deviation.  Cardiovascular:     Rate and Rhythm: Normal rate.  Pulmonary:     Effort: Pulmonary effort is normal.  Abdominal:     General: There is no distension.  Skin:    General: Skin is warm and dry.     Findings: No erythema.  Neurological:     Mental Status: She is alert.     Comments: Fluent speech, no facial droop, moves all extremities spontaneously without difficulty.  Psychiatric:        Behavior: Behavior normal.     ED Course/Procedures     Procedures  MDM   Labs Reviewed  CBC WITH DIFFERENTIAL/PLATELET - Abnormal; Notable for the following components:      Result Value   Hemoglobin 11.4 (*)    All other components within normal limits  COMPREHENSIVE METABOLIC PANEL -  Abnormal; Notable for the following components:   Sodium 132 (*)    Chloride 96 (*)    Glucose, Bld 333 (*)    BUN 24 (*)    Creatinine, Ser 1.32 (*)    Total Protein 6.0 (*)    Albumin 3.2 (*)    AST 42 (*)    Total Bilirubin 1.3 (*)    GFR calc non Af Amer 38 (*)    GFR calc Af Amer 44 (*)    All other components within normal limits  URINALYSIS, ROUTINE W REFLEX MICROSCOPIC - Abnormal; Notable for the following components:   Color, Urine STRAW (*)    Glucose, UA >=500 (*)    Leukocytes,Ua MODERATE (*)    Bacteria, UA RARE (*)    All other components within normal limits  T4, FREE - Abnormal; Notable for the following components:   Free T4 1.14 (*)    All other components within normal limits  CBG MONITORING, ED - Abnormal; Notable for the following components:   Glucose-Capillary 397 (*)    All other components within normal limits  POCT I-STAT EG7 - Abnormal; Notable for the following components:  pH, Ven 7.481 (*)    pCO2, Ven 39.8 (*)    pO2, Ven 139.0 (*)    Bicarbonate 29.7 (*)    Acid-Base Excess 6.0 (*)    Sodium 133 (*)    Calcium, Ion 1.14 (*)    HCT 35.0 (*)    Hemoglobin 11.9 (*)    All other components within normal limits  CBG MONITORING, ED - Abnormal; Notable for the following components:   Glucose-Capillary 225 (*)    All other components within normal limits  TSH    Ct Head Wo Contrast  Result Date: 07/26/2019 CLINICAL DATA:  78 year old female with history of encephalopathy. EXAM: CT HEAD WITHOUT CONTRAST TECHNIQUE: Contiguous axial images were obtained from the base of the skull through the vertex without intravenous contrast. COMPARISON:  Head CT 07/24/2019. FINDINGS: Brain: Moderate cerebral atrophy. Patchy and confluent areas of decreased attenuation are noted throughout the deep and periventricular white matter of the cerebral hemispheres bilaterally, compatible with chronic microvascular ischemic disease. More well-defined low-attenuation area  in the posterior left frontal and left parietal region, compatible with encephalomalacia and gliosis, similar to the prior study, from prior posterior MCA territory infarct. No evidence of acute infarction, hemorrhage, hydrocephalus, extra-axial collection or mass lesion/mass effect. Vascular: No hyperdense vessel or unexpected calcification. Skull: Normal. Negative for fracture or focal lesion. Sinuses/Orbits: No acute finding. Other: None. IMPRESSION: 1. No acute intracranial abnormalities. 2. Moderate cerebral atrophy with extensive chronic microvascular ischemic changes and old left MCA territory infarct, similar to prior examinations. Electronically Signed   By: Trudie Reedaniel  Entrikin M.D.   On: 07/26/2019 19:41   Dg Chest Port 1 View  Result Date: 07/26/2019 CLINICAL DATA:  Cp and sob. syncope EXAM: PORTABLE CHEST 1 VIEW COMPARISON:  07/02/2018 FINDINGS: Heart size is accentuated by technique, likely within normal limits. There is atherosclerotic calcification of the thoracic aorta. Lungs are clear. No pulmonary edema or consolidations. There are chronic changes in both shoulders. IMPRESSION: No evidence for acute cardiopulmonary abnormality. Aortic atherosclerosis.  (ICD10-I70.0) Electronically Signed   By: Norva PavlovElizabeth  Brown M.D.   On: 07/26/2019 17:53    UA does not suggest UTI.  Patient denies any urinary symptoms and on reevaluation reports she is feeling quite well and would like to go home.  A head CT shows no evidence of acute intracranial abnormality.  Chest x-ray shows no acute cardiopulmonary abnormalities.  Stable for discharge home with follow-up with PCP for reevaluation of symptoms as needed.  Discussed strict ED return precautions.  Patient verbalized understanding of and agreement with plan and patient stable for discharge home at this time.       Jeanie SewerFawze, Lunden Stieber A, PA-C 07/26/19 2121    Melene PlanFloyd, [name], DO 07/27/19 1504

## 2019-07-26 NOTE — Patient Outreach (Signed)
[facility]) Care Management  07/26/2019  [name] July 03, 1941 [id number]   CSW made a second attempt to try and contact patient's husband, [name] today to perform the initial phone assessment on patient, as well as assess and assist with social work needs and services, without success.  A HIPAA compliant message was left for [name] on voicemail.  CSW continues to await a return call.  CSW will make a third and final outreach attempt within the next 3-4 business days, if a return call is not received from [name] in the meantime.  CSW will then proceed with case closure if a return call is not received from [name] within a total of 10 business days, as required number of phone attempts will have been made and outreach letter mailed.   Nat Christen, BSW, MSW, LCSW  Licensed Education officer, [facility]  [address] [place] [address] [place], [place] [address] Physical Address-[address], [place], [place] [address] Toll Free Main # [phone] Fax # [phone] Cell # [phone]  Office # [phone] [email or web address]
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
## 2019-07-26 NOTE — Telephone Encounter (Signed)
Husband calling back to follow up on the request for  diazepam (VALIUM) 5 MG tablet.   Last filled by Dr Nolon Rod, but Dr Mitchel Honour is her dr. Abbott Pao had a tele med visit on 06/04/19 with Dr Pamella Pert. Husband states he calls every day. Pt is completely out and really needs.  [place], [place] [phone] (Phone) [phone] (Fax)   If he does not hear something by end of today, he will have to make another appt.

## 2019-07-26 NOTE — ED Triage Notes (Signed)
Pt brought in by ems for c/o lethargy ever since she fell on Saturday ; pt came here and was checked out and everything was negative ; pt alert and oriented x 4 and following simple commands ; pt has hx of stroke with left sided deficits and expressive aphasia ; ems vitals inclused 160/90 hr 90 in afib, 99 % RA and cbg 420

## 2019-07-26 NOTE — ED Notes (Signed)
Called [facility] 

## 2019-07-26 NOTE — Discharge Instructions (Signed)
Your work-up today was reassuring with no signs of new stroke or urinary tract infection or pneumonia.  Drink plenty of fluids and get plenty of rest.  Follow-up with your primary care provider for reevaluation of symptoms as needed.  Return to the emergency department if any concerning signs or symptoms develop such as severe headaches, persistent weakness, loss of consciousness, chest pain, shortness of breath, persistent vomiting, or high fevers.

## 2019-07-27 ENCOUNTER — Ambulatory Visit: Payer: Medicare Other | Admitting: Orthopaedic Surgery

## 2019-07-27 DIAGNOSIS — R279 Unspecified lack of coordination: Secondary | ICD-10-CM | POA: Diagnosis not present

## 2019-07-27 DIAGNOSIS — Z743 Need for continuous supervision: Secondary | ICD-10-CM | POA: Diagnosis not present

## 2019-07-27 NOTE — Telephone Encounter (Signed)
Patient is requesting a refill of the following medications: Requested Prescriptions   Refused Prescriptions Disp Refills  . diazepam (VALIUM) 5 MG tablet [Pharmacy Med Name: diazePAM 5 MG Oral Tablet] 60 tablet 0    Sig: TAKE 1 TABLET BY MOUTH EVERY 12 HOURS AS NEEDED FOR MUSCLE SPASM FOR PAIN    Refused By: Davina Poke    Reason for Refusal: Change not appropriate    Date of patient request: 07/26/2019 Last office visit: 02/22/2019 and seen in ED yesterdat Date of last refill:05/11/2019  Last refill amount: #60 Follow up time period per chart: n/a  Please advise. This was last filled by you, Sagardia the primary not sure if he is willing.   Dgaddy, CMA

## 2019-07-29 ENCOUNTER — Other Ambulatory Visit: Payer: Self-pay | Admitting: Emergency Medicine

## 2019-07-29 NOTE — Telephone Encounter (Signed)
Requested medication (s) are due for refill today: yes  Requested medication (s) are on the active medication list: yes  Last refill:  02/22/2019  Future visit scheduled: no  Notes to clinic:  Review for refill   Requested Prescriptions  Pending Prescriptions Disp Refills   levothyroxine (SYNTHROID) 50 MCG tablet [Pharmacy Med Name: Levothyroxine Sodium 50 MCG Oral Tablet] 30 tablet 0    Sig: TAKE 1 TABLET BY MOUTH ONCE DAILY BEFORE BREAKFAST     Endocrinology:  Hypothyroid Agents Failed - 07/29/2019  3:19 PM      Failed - TSH needs to be rechecked within 3 months after an abnormal result. Refill until TSH is due.      Passed - TSH in normal range and within 360 days    TSH  Date Value Ref Range Status  07/26/2019 0.628 0.350 - 4.500 uIU/mL Final    Comment:    Performed by a 3rd Generation assay with a functional sensitivity of <=0.01 uIU/mL. Performed at [facility], [place] [address], [place], [place] [address]   02/19/2017 2.760 0.450 - 4.500 uIU/mL Final         Passed - Valid encounter within last 12 months    Recent Outpatient Visits          1 month ago Bilateral shoulder pain, unspecified chronicity   [facility] at [facility], Lilia Argue, MD   5 months ago Physical deconditioning   Primary Care at [facility], [place], MD   8 months ago Uncontrolled type 2 diabetes mellitus with hyperglycemia [facility])   Primary Care at [facility], [place], MD   10 months ago Type 2 diabetes mellitus with other specified complication, without long-term current use of insulin [facility])   Primary Care at [facility], Gelene Mink, PA-C   1 year ago Adjustment disorder with mixed anxiety and depressed mood   Primary Care at [facility], Gelene Mink, PA-C      Future Appointments            In 1 week Croitoru, Dani Gobble, MD [facility], [facility]

## 2019-07-30 ENCOUNTER — Other Ambulatory Visit: Payer: Self-pay | Admitting: *Deleted

## 2019-07-30 NOTE — Patient Outreach (Signed)
[facility] [facility]) Care Management  07/30/2019  [name] [birth year] [id number]    CSW made a third and final attempt to try and contact patient's husband, Matisse Roskelley today to perform the phone assessment on patient, as well as assess and assist with social work needs and services, without success.  A HIPAA compliant message was left for Mr. Thiemann on voicemail.  CSW is currently awaiting a return call.  CSW will proceed with case closure in two business days, if a return call is not received from Mr. Hilbun in the meantime, as required number of phone attempts have been made and an outreach letter was mailed to patient's home allowing 10 business days for a response.  CSW will also alert [name], RNCM with [place] [facility], of CSW's inability to establish contact with Mr. Pickford, in the event that she is able to make a successful outreach call to Mr. Leuthold, encouraging him to contact CSW directly if he is interested in receiving social work services.  Nat Christen, BSW, MSW, LCSW  Licensed Education officer, environmental Health System  Mailing [place] [address] [place], [place] [address] Physical Address-[address], [place], [place] [address] Toll Free Main # [phone] Fax # [phone] Cell # [phone]  Office # [phone] [email or web address]

## 2019-08-03 ENCOUNTER — Other Ambulatory Visit: Payer: Self-pay | Admitting: Emergency Medicine

## 2019-08-03 ENCOUNTER — Other Ambulatory Visit: Payer: Self-pay | Admitting: Cardiovascular Disease

## 2019-08-03 DIAGNOSIS — F411 Generalized anxiety disorder: Secondary | ICD-10-CM

## 2019-08-03 DIAGNOSIS — I4891 Unspecified atrial fibrillation: Secondary | ICD-10-CM

## 2019-08-03 NOTE — Telephone Encounter (Signed)
Pt requesting medication 

## 2019-08-03 NOTE — Telephone Encounter (Signed)
Medication Refill - Medication: levothyroxine (SYNTHROID) 50 MCG tablet/diazepam (VALIUM) 5 MG tablet  Pt is out of medication  Has the patient contacted their pharmacy? Yes.   (Agent: If no, request that the patient contact the pharmacy for the refill.) (Agent: If yes, when and what did the pharmacy advise?)  Preferred Pharmacy (with phone number or street name):  [place], [place] - [address] [phone] (Phone) [phone] (Fax)     Agent: Please be advised that RX refills may take up to 3 business days. We ask that you follow-up with your pharmacy.

## 2019-08-03 NOTE — Telephone Encounter (Signed)
Refill request

## 2019-08-03 NOTE — Telephone Encounter (Signed)
Requested medication (s) are due for refill today: yes  Requested medication (s) are on the active medication list: yes   Last refill:  05/10/2019  Future visit scheduled: no  Notes to clinic:  This refill cannot be delegated    Requested Prescriptions  Pending Prescriptions Disp Refills   levothyroxine (SYNTHROID) 50 MCG tablet       Sig: Take 1 tablet (50 mcg total) by mouth daily.     Endocrinology:  Hypothyroid Agents Failed - 08/03/2019  2:48 PM      Failed - TSH needs to be rechecked within 3 months after an abnormal result. Refill until TSH is due.      Passed - TSH in normal range and within 360 days    TSH  Date Value Ref Range Status  07/26/2019 0.628 0.350 - 4.500 uIU/mL Final    Comment:    Performed by a 3rd Generation assay with a functional sensitivity of <=0.01 uIU/mL. Performed at [facility], [place] [address] [place], [place] [address]   02/19/2017 2.760 0.450 - 4.500 uIU/mL Final         Passed - Valid encounter within last 12 months    Recent Outpatient Visits          2 months ago Bilateral shoulder pain, unspecified chronicity   [facility] at [facility], Lilia Argue, MD   5 months ago Physical deconditioning   Primary Care at [facility], [place], MD   8 months ago Uncontrolled type 2 diabetes mellitus with hyperglycemia [facility])   Primary Care at [facility], [place], MD   10 months ago Type 2 diabetes mellitus with other specified complication, without long-term current use of insulin [facility])   Primary Care at [facility], Gelene Mink, PA-C   1 year ago Adjustment disorder with mixed anxiety and depressed mood   Primary Care at [facility], Gelene Mink, PA-C      Future Appointments            In 2 days Croitoru, Dani Gobble, MD [facility], [facility]            diazepam (VALIUM) 5 MG tablet 60 tablet 0     Not Delegated - Psychiatry:  Anxiolytics/Hypnotics Failed - 08/03/2019  2:48 PM      Failed - This  refill cannot be delegated      Failed - Urine Drug Screen completed in last 360 days.      Passed - Valid encounter within last 6 months    Recent Outpatient Visits          2 months ago Bilateral shoulder pain, unspecified chronicity   [facility] at [facility], Lilia Argue, MD   5 months ago Physical deconditioning   Primary Care at [facility], [place], MD   8 months ago Uncontrolled type 2 diabetes mellitus with hyperglycemia [facility])   Primary Care at [facility], [place], MD   10 months ago Type 2 diabetes mellitus with other specified complication, without long-term current use of insulin [facility])   Primary Care at [facility], Gelene Mink, PA-C   1 year ago Adjustment disorder with mixed anxiety and depressed mood   Primary Care at [facility], Gelene Mink, PA-C      Future Appointments            In 2 days Croitoru, Dani Gobble, MD [place], [place]

## 2019-08-04 ENCOUNTER — Encounter: Payer: Self-pay | Admitting: *Deleted

## 2019-08-04 ENCOUNTER — Other Ambulatory Visit: Payer: Self-pay | Admitting: Pharmacist

## 2019-08-04 ENCOUNTER — Other Ambulatory Visit: Payer: Self-pay | Admitting: *Deleted

## 2019-08-04 NOTE — Patient Outreach (Addendum)
[facility]) Care Management  08/04/2019  [name] 06/01/1941 [id number]   Followed up with patient and her husband about their out of pocket costs. HIPAA identifiers were obtained from the patient's husband [name]).    Patient is a 78 year old female with multiple medical conditions including but not limited to:  History of CVA, ADD, anxiety, afib, CHF, chronic pain, hyperlipidemia, depression and type 2 diabetes. Patient recently visited the ED for complications of a fall.  [name] said he did not have the patient's out-of-pocket expenses yet. Eliquis and Lantus have elevated copays.  Eliquis is available from [place] Patient assistance program.  [employer] requires patients to spend at least 3% of their income on medication expenses to qualify for their program.  Lantus is available from [facility] Patient [place] requires patients to spend at least $1000 in out-of-pocket medication expenses.  Patient's husband said he would work on getting either a print out from their local pharmacy or the EOB from [employer].   Since it is unclear how much money the patient has spent on medication expenses, therapeutic substitution was discussed. Nancee Liter is available from [employer] and does not currently require an out-of-pocket expenditure.  Insurance: Medicare A&B, Optum Rx (Part D)  Patient's medications were reviewed.   Medications Reviewed Today    Reviewed by Elayne Guerin, [facility] (Pharmacist) on 08/04/19 at 1110  Med List Status: <None>  Medication Order Taking? Sig Documenting Provider Last Dose Status Informant  ACCU-CHEK SOFTCLIX LANCETS lancets [id number] Yes USE TO TEST FOUR TIMES DAILY McVey, Gelene Mink, PA-C Taking Active Spouse/Significant Other           Med Note Blanch Media, CYNTHIA A   Fri Nov 13, 2018  4:35 PM) Use sometimes   aspirin EC 81 MG EC tablet [id number] Yes Take 1 tablet (81 mg  total) by mouth daily. Modena Jansky, MD Taking Active Spouse/Significant Other  atorvastatin (LIPITOR) 40 MG tablet [id number] Yes TAKE 1 TABLET BY MOUTH ONCE DAILY AT  6PM  Patient taking differently: Take 40 mg by mouth daily at 6 PM.    Rutherford Guys, MD Taking Active Spouse/Significant Other  blood glucose meter kit and supplies [id number] Yes Dispense based on patient and insurance preference. Use up to four times daily as directed. (FOR ICD-9 250.00, 250.01). Oswald Hillock, MD Taking Active Spouse/Significant Other           Med Note Blanch Media, CYNTHIA A   Fri Nov 13, 2018  4:35 PM) use  Cholecalciferol (VITAMIN D3) 5000 units TBDP [id number] Yes Take 5,000 Units by mouth daily.  [provider] Taking Active Spouse/Significant Other  CHROMIUM ASPARTATE PO [id number] Yes Take 200 mg by mouth every morning.  [provider] Taking Active Spouse/Significant Other  DHA-EPA-Coenzyme Q10-Vitamin E (GNP COQ-10 & FISH OIL) 120-180-50-30 CAPS [id number] Yes Take 2 tablets by mouth daily.  [provider] Taking Active Spouse/Significant Other           Med Note Nicki Reaper, MELISSA A   Fri Jan 09, 2018 12:39 PM)    diazepam (VALIUM) 5 MG tablet [id number] Yes TAKE 1 TABLET BY MOUTH EVERY 12 HOURS AS NEEDED FOR MUSCLE SPASM FOR PAIN  Patient taking differently: Take 5 mg by mouth every 12 (twelve) hours as needed for muscle spasms (pain).    Forrest Moron, MD Taking Active Spouse/Significant Other  ELIQUIS 5 MG TABS tablet [id number] Yes Take 1  tablet by mouth twice daily  Patient taking differently: Take 5 mg by mouth 2 (two) times daily.    Croitoru, Mihai, MD Taking Active Spouse/Significant Other  fexofenadine (ALLEGRA) 180 MG tablet [id number] Yes Take 180 mg by mouth daily as needed for allergies.  [provider] Taking Active Spouse/Significant Other  glucose blood (ACCU-CHEK AVIVA PLUS) test strip [id number] Yes USE TO TEST FOUR TIMES DAILY McVey, Gelene Mink, PA-C Taking Active Spouse/Significant Other           Med Note Blanch Media, CYNTHIA A   Fri Nov 13, 2018  4:35 PM) use  hydrochlorothiazide (HYDRODIURIL) 25 MG tablet [id number] Yes Take 1 tablet by mouth once daily  Patient taking differently: Take 25 mg by mouth daily.    Horald Pollen, MD Taking Active Spouse/Significant Other  HYDROcodone-acetaminophen [facility]) 10-325 MG per tablet [id number] Yes Take 1-2 tablets by mouth every 4 (four) hours as needed for moderate pain.  [provider] Taking Active Spouse/Significant Other           Med Note Nash Mantis, TIFFANI S   Tue May 19, 2018  3:02 PM)    Insulin Syringe-Needle U-100 (INSULIN SYRINGE .5CC/31GX5/16") 31G X 5/16" 0.5 ML MISC [id number] Yes Use as directed Oswald Hillock, MD Taking Active Spouse/Significant Other           Med Note Blanch Media, CYNTHIA A   Fri Nov 13, 2018  4:36 PM) use  LANTUS SOLOSTAR 100 UNIT/ML Solostar Pen [id number] Yes Inject 24 Units into the skin at bedtime. [provider] Taking Active Spouse/Significant Other  levothyroxine (SYNTHROID) 50 MCG tablet [id number] Yes Take 50 mcg by mouth daily.  [provider] Taking Active Spouse/Significant Other  lisinopril (ZESTRIL) 20 MG tablet [id number] Yes TAKE 1 TABLET BY MOUTH IN THE MORNING AND 1/2 (ONE-HALF) IN THE EVENING  Patient taking differently: Take 10-20 mg by mouth See admin instructions. 20 mg in the morning and 10 mg in the evening   Croitoru, Mihai, MD Taking Active Spouse/Significant Other  magnesium oxide (MAG-OX) 400 MG tablet [id number] Yes Take 800 mg by mouth 2 (two) times daily.  [provider] Taking Active Spouse/Significant Other  metFORMIN (GLUCOPHAGE XR) 500 MG 24 hr tablet [id number] Yes (take with food) Week 1: take 1/2 tab twice a day; Week 2: take 1 tab in the morning, 1/2 tablet at night; Week 3: take 2 tabs twice a day. McVey, Gelene Mink, PA-C Taking Active Spouse/Significant Other  metoprolol  tartrate (LOPRESSOR) 50 MG tablet [id number] Yes Take 1 tablet by mouth twice daily  Patient taking differently: Take 50 mg by mouth 2 (two) times daily.    Horald Pollen, MD Taking Active Spouse/Significant Other  Misc. Devices ([place]) [place] [id number] Yes 1 Units by Does not apply route daily as needed. McVey, Gelene Mink, PA-C Taking Active Spouse/Significant Other           Med Note Blanch Media, CYNTHIA A   Fri Nov 13, 2018  4:40 PM) use  potassium chloride (K-DUR) 10 MEQ tablet [id number] Yes Take 10 mEq by mouth daily.  [provider] Taking Active Spouse/Significant Other  venlafaxine XR (EFFEXOR XR) 150 MG 24 hr capsule [id number] Yes Take 1 capsule (150 mg total) by mouth daily with breakfast. McVey, Gelene Mink, PA-C Taking Active Spouse/Significant Other         HgA1c -8.4% (2019) On statin (atorvastatin)  Plan: I will route patient assistance letter to [place] technician who will  coordinate patient assistance program application process for medications listed above.  [facility] pharmacy technician will assist with obtaining all required documents from both patient and provider(s) and submit application(s) once completed.    Will route note to patient's provider about therapeutic substitution.  Follow up in 4 weeks.   Elayne Guerin, PharmD, [place] Clinical Pharmacist [phone]

## 2019-08-04 NOTE — Patient Outreach (Signed)
[place] [facility]) Care Management  08/04/2019  FRANKYE SCHWEGEL 02-Jul-1941 [id number]   CSW will perform a case closure on patient, due to inability to establish initial phone contact with patient's husband, Dayanne Yiu, despite required number of phone attempts made and outreach letter mailed, allowing 10 business days for a response if Mr. Morlock is interested in receiving social work services through [place] with [facility].  CSW will notify patient's RNCM with [place] [facility], [name] of CSW's plans to close patient's case.  CSW will fax an update to patient's Primary Care Physician, Dr. Agustina Caroli to ensure that they are aware of CSW's involvement with patient's plan of care.    Nat Christen, BSW, MSW, LCSW  Licensed Education officer, environmental Health System  Mailing [place] [address], [place], [place] [address] Physical Address-[address], [place], [place] [address] Toll Free Main # [phone] Fax # [phone] Cell # [phone]  Office # [phone] [email or web address]

## 2019-08-05 ENCOUNTER — Other Ambulatory Visit: Payer: Self-pay

## 2019-08-05 ENCOUNTER — Telehealth: Payer: Self-pay | Admitting: Cardiovascular Disease

## 2019-08-05 ENCOUNTER — Ambulatory Visit: Payer: Medicare Other | Admitting: Cardiovascular Disease

## 2019-08-05 ENCOUNTER — Other Ambulatory Visit: Payer: Self-pay | Admitting: Pharmacy Technician

## 2019-08-05 NOTE — Telephone Encounter (Signed)
Call attempted to [facility] to discuss med refills for non-cardiac medications: levothyroxine and diazepam. Left message for patient to call prescribing physician for those refills.

## 2019-08-05 NOTE — Patient Outreach (Signed)
[facility] [facility]) Care Management  08/05/2019  JALISIA PUCHALSKI [birth year] [id number]    Outreach the spouse Mihika Surrette.  Two patient identifiers given.  Spoke with caregiver to notify him that Nat Christen LCSW had called and left messages for him to discuss in home care.  He states that he is still interested in receiving in home care for his wife.  He states that it will depend on price.  He was given the contact information for Mrs. Saporito and asked to call her when he was available to talk.  He took down the information and stated that he would.  Plan:  RN Health Coach will follow up with the patient at the next scheduled interval.  [name] RN, BSN, [place]:  [phone]  Fax: [phone]

## 2019-08-05 NOTE — Telephone Encounter (Signed)
New Message   *STAT* If patient is at the pharmacy, call can be transferred to refill team.   1. Which medications need to be refilled? (please list name of each medication and dose if known) ELIQUIS 5 MG TABS tablet diazepam (VALIUM) 5 MG tablet levothyroxine (SYNTHROID) 50 MCG tablet  2. Which pharmacy/location (including street and city if local pharmacy) is medication to be sent to? [place], [place]  3. Do they need a 30 day or 90 day supply? 30 day supply

## 2019-08-05 NOTE — Telephone Encounter (Signed)
Refill request for Eliquis

## 2019-08-05 NOTE — Telephone Encounter (Signed)
I am sorry. I cannot refill those.

## 2019-08-05 NOTE — Telephone Encounter (Signed)
See below? Okay to refill? Thanks.

## 2019-08-05 NOTE — Patient Outreach (Signed)
[facility]) Care Management  08/05/2019  SAQUOIA SIANEZ 03-21-1941 [id number]                                        Medication Assistance Referral  Referral From: [place]  Medication/Company: [employer] Patient application portion:  Mailed Provider application portion: Faxed  to Dr. Mitchel Honour Provider address/fax verified via: Office website  Medication/Company: Eliquis / [employer]MS Patient application portion:  Mailed Provider application portion: Faxed  to Dr. Sallyanne Kuster Provider address/fax verified via: Office website   Follow up:  Will follow up with patient in 5-10 business days to confirm application(s) have been received.  Lycan Davee P. Seini Lannom, [place] Management [phone]

## 2019-08-05 NOTE — Telephone Encounter (Signed)
Spoke with patient's husband [name] who is requesting refills of levothyroxine and diazepam. Advised him to contact patient's PCP as these are not cardiac medications however patient states he and her pharmacy have tried several times to get these refilled by her new PCP but their office is not responding. Patient is requesting a call back from the nurse because would like to know if Dr. Sallyanne Kuster will temporarily refill these.

## 2019-08-06 NOTE — Telephone Encounter (Signed)
Called and notified patient husband. He verbalized understanding.

## 2019-08-12 ENCOUNTER — Telehealth: Payer: Self-pay | Admitting: *Deleted

## 2019-08-12 NOTE — Telephone Encounter (Signed)
On 08/11/2019, faxed sign prescription for Basaglar 25 units Attn:Jill. Confirmation page 12:17 pm.

## 2019-08-14 ENCOUNTER — Emergency Department (HOSPITAL_COMMUNITY): Payer: Medicare Other

## 2019-08-14 ENCOUNTER — Emergency Department (HOSPITAL_COMMUNITY)
Admission: EM | Admit: 2019-08-14 | Discharge: 2019-08-14 | Disposition: A | Discharge status: Home / Self Care | Payer: Medicare Other | Attending: Emergency Medicine | Admitting: Emergency Medicine

## 2019-08-14 ENCOUNTER — Other Ambulatory Visit: Payer: Self-pay

## 2019-08-14 ENCOUNTER — Encounter (HOSPITAL_COMMUNITY): Payer: Self-pay

## 2019-08-14 DIAGNOSIS — I951 Orthostatic hypotension: Secondary | ICD-10-CM | POA: Insufficient documentation

## 2019-08-14 DIAGNOSIS — Z7901 Long term (current) use of anticoagulants: Secondary | ICD-10-CM | POA: Diagnosis not present

## 2019-08-14 DIAGNOSIS — E119 Type 2 diabetes mellitus without complications: Secondary | ICD-10-CM | POA: Insufficient documentation

## 2019-08-14 DIAGNOSIS — E039 Hypothyroidism, unspecified: Secondary | ICD-10-CM | POA: Diagnosis not present

## 2019-08-14 DIAGNOSIS — R531 Weakness: Secondary | ICD-10-CM | POA: Insufficient documentation

## 2019-08-14 DIAGNOSIS — Z7982 Long term (current) use of aspirin: Secondary | ICD-10-CM | POA: Insufficient documentation

## 2019-08-14 DIAGNOSIS — I2510 Atherosclerotic heart disease of native coronary artery without angina pectoris: Secondary | ICD-10-CM | POA: Insufficient documentation

## 2019-08-14 DIAGNOSIS — I110 Hypertensive heart disease with heart failure: Secondary | ICD-10-CM | POA: Diagnosis not present

## 2019-08-14 DIAGNOSIS — Z794 Long term (current) use of insulin: Secondary | ICD-10-CM | POA: Insufficient documentation

## 2019-08-14 DIAGNOSIS — G459 Transient cerebral ischemic attack, unspecified: Secondary | ICD-10-CM | POA: Diagnosis not present

## 2019-08-14 DIAGNOSIS — Z7401 Bed confinement status: Secondary | ICD-10-CM | POA: Diagnosis not present

## 2019-08-14 DIAGNOSIS — I4891 Unspecified atrial fibrillation: Secondary | ICD-10-CM | POA: Diagnosis not present

## 2019-08-14 DIAGNOSIS — I5032 Chronic diastolic (congestive) heart failure: Secondary | ICD-10-CM | POA: Insufficient documentation

## 2019-08-14 DIAGNOSIS — M2550 Pain in unspecified joint: Secondary | ICD-10-CM | POA: Diagnosis not present

## 2019-08-14 DIAGNOSIS — R402411 Glasgow coma scale score 13-15, in the field [EMT or ambulance]: Secondary | ICD-10-CM | POA: Diagnosis not present

## 2019-08-14 DIAGNOSIS — R4020 Unspecified coma: Secondary | ICD-10-CM | POA: Diagnosis not present

## 2019-08-14 DIAGNOSIS — E1165 Type 2 diabetes mellitus with hyperglycemia: Secondary | ICD-10-CM | POA: Diagnosis not present

## 2019-08-14 DIAGNOSIS — I959 Hypotension, unspecified: Secondary | ICD-10-CM | POA: Diagnosis not present

## 2019-08-14 LAB — HEPATIC FUNCTION PANEL
ALT: 17 U/L (ref 0–44)
AST: 22 U/L (ref 15–41)
Albumin: 3 g/dL — ABNORMAL LOW (ref 3.5–5.0)
Alkaline Phosphatase: 94 U/L (ref 38–126)
Bilirubin, Direct: <0.1 mg/dL (ref 0.0–0.2)
Total Bilirubin: 0.3 mg/dL (ref 0.3–1.2)
Total Protein: 5.8 g/dL — ABNORMAL LOW (ref 6.5–8.1)

## 2019-08-14 LAB — TROPONIN I (HIGH SENSITIVITY)
Troponin I (High Sensitivity): 11 ng/L (ref <18)
Troponin I (High Sensitivity): 8 ng/L (ref <18)

## 2019-08-14 LAB — BASIC METABOLIC PANEL
Anion gap: 14 (ref 5–15)
BUN: 24 mg/dL — ABNORMAL HIGH (ref 8–23)
CO2: 22 mmol/L (ref 22–32)
Calcium: 8.8 mg/dL — ABNORMAL LOW (ref 8.9–10.3)
Chloride: 98 mmol/L (ref 98–111)
Creatinine, Ser: 1.37 mg/dL — ABNORMAL HIGH (ref 0.44–1.00)
GFR calc Af Amer: 42 mL/min — ABNORMAL LOW (ref >60)
GFR calc non Af Amer: 37 mL/min — ABNORMAL LOW (ref >60)
Glucose, Bld: 303 mg/dL — ABNORMAL HIGH (ref 70–99)
Potassium: 3.2 mmol/L — ABNORMAL LOW (ref 3.5–5.1)
Sodium: 134 mmol/L — ABNORMAL LOW (ref 135–145)

## 2019-08-14 LAB — URINALYSIS, ROUTINE W REFLEX MICROSCOPIC
Bacteria, UA: NONE SEEN
Bilirubin Urine: NEGATIVE
Glucose, UA: >=500 mg/dL — AB
Hgb urine dipstick: NEGATIVE
Ketones, ur: NEGATIVE mg/dL
Nitrite: NEGATIVE
Protein, ur: NEGATIVE mg/dL
Specific Gravity, Urine: 1.015 (ref 1.005–1.030)
pH: 5 (ref 5.0–8.0)

## 2019-08-14 LAB — CBC
HCT: 33.8 % — ABNORMAL LOW (ref 36.0–46.0)
Hemoglobin: 10.7 g/dL — ABNORMAL LOW (ref 12.0–15.0)
MCH: 26.7 pg (ref 26.0–34.0)
MCHC: 31.7 g/dL (ref 30.0–36.0)
MCV: 84.3 fL (ref 80.0–100.0)
Platelets: 256 10*3/uL (ref 150–400)
RBC: 4.01 MIL/uL (ref 3.87–5.11)
RDW: 14 % (ref 11.5–15.5)
WBC: 6.3 10*3/uL (ref 4.0–10.5)
nRBC: 0 % (ref 0.0–0.2)

## 2019-08-14 MED ORDER — SODIUM CHLORIDE 0.9 % IV BOLUS
500.0000 mL | Freq: Once | INTRAVENOUS | Status: AC
  Administered 2019-08-14: 500 mL via INTRAVENOUS

## 2019-08-14 NOTE — ED Notes (Signed)
[facility] 

## 2019-08-14 NOTE — Discharge Instructions (Addendum)
Your blood pressure was slightly low today.  Please decrease your lisinopril to half a tablet in the morning and none in the evening for the next several days.  Please follow up with your family doctor in the next week for recheck.

## 2019-08-14 NOTE — ED Notes (Signed)
Patient transported to CT 

## 2019-08-14 NOTE — ED Triage Notes (Signed)
Pt brought from home via [facility] for weaknessx1 week. Pt has right sided weakness, right facial droop, speech impedement from prior stroke. Pt's right side is weaker than normal. Initially EMS couldn't get a bp. EMS gave NS 531mL then bp was 100/60. Pt more confused than normal. When EMS arrived they couldn't feel radial pulses. Per EMS when pt would grab something they would tell her to let go and she would say she's trying, but would still be holding on tight. Pt seems to be having expressive aphasia.  Pt is A-fib on monitor.

## 2019-08-14 NOTE — ED Notes (Signed)
CALLED [facility] FOR TRANSPORT HOME--Tanayia Wahlquist  

## 2019-08-14 NOTE — ED Notes (Signed)
Pt did okay walking w/walker. She was drifting to the right a little, but she walked okay and denied dizziness or weakness.
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
## 2019-08-15 LAB — URINE CULTURE: Culture: <10000 — AB
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
## 2019-08-16 ENCOUNTER — Telehealth: Payer: Self-pay | Admitting: Emergency Medicine

## 2019-08-16 ENCOUNTER — Other Ambulatory Visit: Payer: Self-pay | Admitting: Pharmacy Technician

## 2019-08-16 NOTE — Patient Outreach (Signed)
[facility]) Care Management  08/16/2019  DEANNAH ROSSI [birth year] [id number]    ADDENDUM  Incoming call received from patient's husband [name] in regards to [employer] application for [employer] and BMS application for Eliquis.  Spoke to patient's husband [name], HIPAA identifiers confirmed and verified.  [name] was calling me back because after going thru the paperwork he had, he was unable to locate the applications. He informed he had welcome information from [facility] but not the application and letter from [place] that we mailed out on 08/05/2019.  Informed patient would place another set of applications in the mail on 08/17/2019 and would followup with him in 7-10 business days. Patient was agreeable to this plan.  Pinkney Venard P. Keyli Duross, [place] Management [phone]

## 2019-08-16 NOTE — Patient Outreach (Signed)
[facility]) Care Management  08/16/2019  [name] 04-Apr-1941 [id number]   Unsuccessful outgoing call placed to patient in regards to BMS application for Eliquis and [name] application for [employer].  Unfortunately patient did not answer the phone, HIPAA compliant voicemail left.  Was calling patient to inquire if she had received the patient assistance applications that were mailed to her on 08/05/2019.  Will followup with patient with 2nd outreach in 5-7 business days.  Parris Signer P. Edin Kon, [place] Management [phone]

## 2019-08-16 NOTE — Patient Outreach (Signed)
[facility]) Care Management  08/16/2019  [name] Oct 23, 1941 [id number]   ADDENDUM  Incoming call received from patient's husband in regards to patient's application for Eliquis with BMS and Basaglar with [place].  Spoke to patient's husband [name], HIPAA identifiers confirmed and verified.  Patient's husband informed they have received the patient assistance applications but have not mailed them back in. He informed he will gather the information and mail back this week hopefully. He informed wife is resting today and is tired. He informed she went to hospital over the weekend and he is in the process of notifying her provider.  Will followup with patient in 10-15 business days if applications have not been received back.  Rydge Texidor P. Evangelos Paulino, [place] Management [phone]

## 2019-08-16 NOTE — Telephone Encounter (Signed)
Pt's husband told [name] is to weak to have a physical therapy evaluation today. [name] is going to try again this coming Thursday 08/19/19. FYI

## 2019-08-17 NOTE — Patient Outreach (Signed)
[facility]) Care Management  08/17/2019  [name] 1941-07-22 [id number]  Incoming call from patient's husband [name] in regards to patient's application for Eliquis and BMS and Engineer, agricultural with [employer].  Patient's husband left me a confidential voicemail stating he found the patient assistance applications that were mailed to his wife. He informed he is in the process of gathering the information and will have applications in the mail asap.  Will followup with patient in 10-15 business days if application has not been received back.  Mahmud Keithly P. Andras Grunewald, [place] Management [phone]

## 2019-08-18 DIAGNOSIS — M47812 Spondylosis without myelopathy or radiculopathy, cervical region: Secondary | ICD-10-CM | POA: Diagnosis not present

## 2019-08-18 DIAGNOSIS — G894 Chronic pain syndrome: Secondary | ICD-10-CM | POA: Diagnosis not present

## 2019-08-18 DIAGNOSIS — I5032 Chronic diastolic (congestive) heart failure: Secondary | ICD-10-CM | POA: Diagnosis not present

## 2019-08-18 DIAGNOSIS — F909 Attention-deficit hyperactivity disorder, unspecified type: Secondary | ICD-10-CM | POA: Diagnosis not present

## 2019-08-18 DIAGNOSIS — I951 Orthostatic hypotension: Secondary | ICD-10-CM | POA: Diagnosis not present

## 2019-08-18 DIAGNOSIS — Z7982 Long term (current) use of aspirin: Secondary | ICD-10-CM | POA: Diagnosis not present

## 2019-08-18 DIAGNOSIS — E669 Obesity, unspecified: Secondary | ICD-10-CM | POA: Diagnosis not present

## 2019-08-18 DIAGNOSIS — F411 Generalized anxiety disorder: Secondary | ICD-10-CM | POA: Diagnosis not present

## 2019-08-18 DIAGNOSIS — F450 Somatization disorder: Secondary | ICD-10-CM | POA: Diagnosis not present

## 2019-08-18 DIAGNOSIS — M19011 Primary osteoarthritis, right shoulder: Secondary | ICD-10-CM | POA: Diagnosis not present

## 2019-08-18 DIAGNOSIS — Z7901 Long term (current) use of anticoagulants: Secondary | ICD-10-CM | POA: Diagnosis not present

## 2019-08-18 DIAGNOSIS — Z794 Long term (current) use of insulin: Secondary | ICD-10-CM | POA: Diagnosis not present

## 2019-08-18 DIAGNOSIS — E785 Hyperlipidemia, unspecified: Secondary | ICD-10-CM | POA: Diagnosis not present

## 2019-08-18 DIAGNOSIS — I69320 Aphasia following cerebral infarction: Secondary | ICD-10-CM | POA: Diagnosis not present

## 2019-08-18 DIAGNOSIS — G4710 Hypersomnia, unspecified: Secondary | ICD-10-CM | POA: Diagnosis not present

## 2019-08-18 DIAGNOSIS — I110 Hypertensive heart disease with heart failure: Secondary | ICD-10-CM | POA: Diagnosis not present

## 2019-08-18 DIAGNOSIS — I69351 Hemiplegia and hemiparesis following cerebral infarction affecting right dominant side: Secondary | ICD-10-CM | POA: Diagnosis not present

## 2019-08-18 DIAGNOSIS — Z8619 Personal history of other infectious and parasitic diseases: Secondary | ICD-10-CM | POA: Diagnosis not present

## 2019-08-18 DIAGNOSIS — Z955 Presence of coronary angioplasty implant and graft: Secondary | ICD-10-CM | POA: Diagnosis not present

## 2019-08-18 DIAGNOSIS — F329 Major depressive disorder, single episode, unspecified: Secondary | ICD-10-CM | POA: Diagnosis not present

## 2019-08-18 DIAGNOSIS — M19012 Primary osteoarthritis, left shoulder: Secondary | ICD-10-CM | POA: Diagnosis not present

## 2019-08-18 DIAGNOSIS — I2510 Atherosclerotic heart disease of native coronary artery without angina pectoris: Secondary | ICD-10-CM | POA: Diagnosis not present

## 2019-08-18 DIAGNOSIS — E119 Type 2 diabetes mellitus without complications: Secondary | ICD-10-CM | POA: Diagnosis not present

## 2019-08-18 DIAGNOSIS — F988 Other specified behavioral and emotional disorders with onset usually occurring in childhood and adolescence: Secondary | ICD-10-CM | POA: Diagnosis not present

## 2019-08-18 DIAGNOSIS — I4891 Unspecified atrial fibrillation: Secondary | ICD-10-CM | POA: Diagnosis not present

## 2019-08-20 DIAGNOSIS — I951 Orthostatic hypotension: Secondary | ICD-10-CM | POA: Diagnosis not present

## 2019-08-20 DIAGNOSIS — I69351 Hemiplegia and hemiparesis following cerebral infarction affecting right dominant side: Secondary | ICD-10-CM | POA: Diagnosis not present

## 2019-08-20 DIAGNOSIS — I5032 Chronic diastolic (congestive) heart failure: Secondary | ICD-10-CM | POA: Diagnosis not present

## 2019-08-20 DIAGNOSIS — I2510 Atherosclerotic heart disease of native coronary artery without angina pectoris: Secondary | ICD-10-CM | POA: Diagnosis not present

## 2019-08-20 DIAGNOSIS — I110 Hypertensive heart disease with heart failure: Secondary | ICD-10-CM | POA: Diagnosis not present

## 2019-08-20 DIAGNOSIS — I69320 Aphasia following cerebral infarction: Secondary | ICD-10-CM | POA: Diagnosis not present

## 2019-08-20 NOTE — Telephone Encounter (Signed)
[name] called in to get the okay to see pt for social work on Monday, pt's spouse asked that it be postponed until then.   CB: [phone]

## 2019-08-23 ENCOUNTER — Other Ambulatory Visit: Payer: Self-pay | Admitting: Emergency Medicine

## 2019-08-23 ENCOUNTER — Telehealth: Payer: Self-pay

## 2019-08-23 ENCOUNTER — Other Ambulatory Visit: Payer: Self-pay

## 2019-08-23 DIAGNOSIS — F411 Generalized anxiety disorder: Secondary | ICD-10-CM

## 2019-08-23 DIAGNOSIS — I110 Hypertensive heart disease with heart failure: Secondary | ICD-10-CM | POA: Diagnosis not present

## 2019-08-23 DIAGNOSIS — I5032 Chronic diastolic (congestive) heart failure: Secondary | ICD-10-CM | POA: Diagnosis not present

## 2019-08-23 DIAGNOSIS — I69351 Hemiplegia and hemiparesis following cerebral infarction affecting right dominant side: Secondary | ICD-10-CM | POA: Diagnosis not present

## 2019-08-23 DIAGNOSIS — I951 Orthostatic hypotension: Secondary | ICD-10-CM | POA: Diagnosis not present

## 2019-08-23 DIAGNOSIS — I2510 Atherosclerotic heart disease of native coronary artery without angina pectoris: Secondary | ICD-10-CM | POA: Diagnosis not present

## 2019-08-23 DIAGNOSIS — I69320 Aphasia following cerebral infarction: Secondary | ICD-10-CM | POA: Diagnosis not present

## 2019-08-23 NOTE — Telephone Encounter (Signed)
Will reassess in 2 days.  Valium will not be renewed.  She has been overmedicated in the past.  Thanks.

## 2019-08-23 NOTE — Patient Outreach (Addendum)
[facility]) Care Management  08/23/2019   SAMARA STANKOWSKI 28-Jul-1941 [id number]  Subjective: Successful outreach to the patient: Two patient identifiers given.  The patient states his wife has not been doing well.  She has been to the ER three times in the last two months.  He feels that she has had some decline. He states that her left arm will go limp and she is unable to move the left leg and he is unsure why.  He states that he was unable to get her up last night and had to call the paramedics.  They came out and got her up and checked her blood sugar as well and it was 448 per the caregiver.  He said he was instructed by the paramedics to have her to eat something and take her insulin.  Caregiver rechecked her blood sugar at 330 am and it was 406 then again at 1030 am it was 306. I informed the caregiver that I would call the doctors office and leave a message to inform them of the I information.  I was able to speak with Nicholas,Shannon and Chanda at the office to give them the information for the caregiver.  He was very Patent attorney.  He has an appointment on 08/30/19 to see her PCP. Mr. Sadiq also states that the patient had physical therapy to come to the home.  They are giving her exercises to do in bed.  He will have a Education officer, museum come to the home today to discuss options for help.  Mr. Arrighi was informed that I will be moved to a different position.  My colleagues will follow up with him at the next scheduled interval.  Current Medications:  Current Outpatient Medications  Medication Sig Dispense Refill  . ACCU-CHEK SOFTCLIX LANCETS lancets USE TO TEST FOUR TIMES DAILY (Patient taking differently: 1 each by Other route See admin instructions. Use as directed to test blood sugar four times daily.) 100 each 5  . aspirin EC 81 MG EC tablet Take 1 tablet (81 mg total) by mouth daily. 30 tablet 0  . atorvastatin (LIPITOR) 40 MG tablet TAKE 1 TABLET BY MOUTH ONCE DAILY AT   6PM (Patient taking differently: Take 40 mg by mouth daily at 6 PM. ) 90 tablet 0  . blood glucose meter kit and supplies Dispense based on patient and insurance preference. Use up to four times daily as directed. (FOR ICD-9 250.00, 250.01). (Patient taking differently: 1 each by Other route See admin instructions. Use up to four times daily as directed. (FOR ICD-9 250.00, 250.01).) 1 each 0  . Cholecalciferol (VITAMIN D3) 5000 units TBDP Take 5,000 Units by mouth daily.     . DHA-EPA-Coenzyme Q10-Vitamin E (GNP COQ-10 & FISH OIL) 120-180-50-30 CAPS Take 2 tablets by mouth daily.     . diazepam (VALIUM) 5 MG tablet TAKE 1 TABLET BY MOUTH EVERY 12 HOURS AS NEEDED FOR MUSCLE SPASM FOR PAIN (Patient taking differently: Take 5 mg by mouth every 12 (twelve) hours as needed for muscle spasms (pain). ) 60 tablet 0  . ELIQUIS 5 MG TABS tablet Take 1 tablet by mouth twice daily (Patient taking differently: Take 5 mg by mouth 2 (two) times daily. ) 60 tablet 0  . fexofenadine (ALLEGRA) 180 MG tablet Take 180 mg by mouth daily as needed for allergies.     Marland Kitchen glucose blood (ACCU-CHEK AVIVA PLUS) test strip USE TO TEST FOUR TIMES DAILY (Patient taking differently: 1 each by  Other route See admin instructions. Use as directed to test four times daily.) 100 each 0  . hydrochlorothiazide (HYDRODIURIL) 25 MG tablet Take 1 tablet by mouth once daily (Patient taking differently: Take 25 mg by mouth daily. ) 90 tablet 0  . HYDROcodone-acetaminophen (NORCO) 10-325 MG per tablet Take 1-2 tablets by mouth every 4 (four) hours as needed for moderate pain.     . Insulin Syringe-Needle U-100 (INSULIN SYRINGE .5CC/31GX5/16") 31G X 5/16" 0.5 ML MISC Use as directed (Patient taking differently: 1 application by Other route See admin instructions. Use as directed) 100 each 0  . LANTUS SOLOSTAR 100 UNIT/ML Solostar Pen Inject 24 Units into the skin at bedtime.    Marland Kitchen levothyroxine (SYNTHROID) 50 MCG tablet Take 50 mcg by mouth daily.      Marland Kitchen lisinopril (ZESTRIL) 20 MG tablet TAKE 1 TABLET BY MOUTH IN THE MORNING AND 1/2 (ONE-HALF) IN THE EVENING (Patient taking differently: Take 10-20 mg by mouth See admin instructions. 20 mg in the morning and 10 mg in the evening) 135 tablet 1  . magnesium oxide (MAG-OX) 400 MG tablet Take 800 mg by mouth 2 (two) times daily.     . metFORMIN (GLUCOPHAGE XR) 500 MG 24 hr tablet (take with food) Week 1: take 1/2 tab twice a day; Week 2: take 1 tab in the morning, 1/2 tablet at night; Week 3: take 2 tabs twice a day. (Patient taking differently: See admin instructions. (take with food) Week 1: take 1/2 tab twice a day; Week 2: take 1 tab in the morning, 1/2 tablet at night; Week 3: take 2 tabs twice a day.) 180 tablet 2  . metoprolol tartrate (LOPRESSOR) 50 MG tablet Take 1 tablet by mouth twice daily (Patient taking differently: Take 50 mg by mouth 2 (two) times daily. ) 60 tablet 1  . Misc. Devices (HUGO ROLLING WALKER ELITE) MISC 1 Units by Does not apply route daily as needed. (Patient taking differently: 1 Units by Other route daily as needed. ) 1 each 0  . potassium chloride (K-DUR) 10 MEQ tablet Take 10 mEq by mouth daily.     Marland Kitchen venlafaxine XR (EFFEXOR XR) 150 MG 24 hr capsule Take 1 capsule (150 mg total) by mouth daily with breakfast. 90 capsule 3  . CHROMIUM ASPARTATE PO Take 200 mg by mouth every morning.      No current facility-administered medications for this visit.     Functional Status:  In your present state of health, do you have any difficulty performing the following activities: 06/16/2019 06/14/2019  Hearing? N N  Vision? Y N  Difficulty concentrating or making decisions? Y Y  Comment due to stoke Minimal - due to history of Stroke.  Walking or climbing stairs? Y Y  Comment due to stroke History of Stroke.  Dressing or bathing? Tempie Donning  Comment stroke History of Stroke.  Doing errands, shopping? Tempie Donning  Comment stroke History of Stroke.  Preparing Food and eating ? - Y  Using the  Toilet? - Y  In the past six months, have you accidently leaked urine? - Y  Do you have problems with loss of bowel control? - N  Managing your Medications? - Y  Managing your Finances? - Y  Housekeeping or managing your Housekeeping? - Y  Some recent data might be hidden    Fall/Depression Screening: Fall Risk  08/23/2019 07/21/2019 06/16/2019  Falls in the past year? 1 1 -  Comment - - -  Number  falls in past yr: 1 0 1  Injury with Fall? 1 0 1  Comment - - fell broke her left wrist  Risk Factor Category  - - -  Risk for fall due to : History of fall(s) History of fall(s);Impaired balance/gait;Impaired mobility History of fall(s);Impaired balance/gait  Follow up Education provided;Falls evaluation completed Falls evaluation completed;Education provided Falls evaluation completed;Education provided   [facility] 2/9 Scores 06/16/2019 06/14/2019 11/13/2018 10/02/2018 09/30/2018 04/22/2018 04/01/2018  PHQ - 2 Score 2 0 0 2 1 0 0  PHQ- 9 Score 10 - - - - - -  Exception Documentation - Other- indicate reason in comment box - - - - -  Not completed - Assessment completed by patient's husband who reports that patient has a history of depression, but does not currently suffer from symptoms. - - - - -    Assessment: Patient will continue to benefit from health coach outreach for disease management and support. THN CM Care Plan Problem One     Most Recent Value  THN Long Term Goal   In 60 days the cargiver will be able to verbalize lowering her a1c from 1-2 points from 8.4.  THN Long Term Goal Start Date  08/23/19  Interventions for Problem One Long Term Goal  Reviewed  blood sugars with the cargiver, discussed medication and adherence, encouraged the patient to discuss choices  with the social worker and doctors office of patient condition     Plan: RN Health Coach will contact patient in the month of November and patient agrees to next outreach.     [name] RN, BSN, [place] Direct Dial:  [phone]  Fax: [phone]

## 2019-08-23 NOTE — Telephone Encounter (Signed)
Call from [place].  Husband reports pt had generalized weakness in LUE, LLE which has been ongoing x at least 1 month and EMS called to help get her out of bed last night. Fingersticks were 306-448 throughout the evening and this morning, which are higher than usual.  Had been running 181-286 over previous few days. Husband confirmed no new onset of left sided weakness.  Appt scheduled for 09/30.    Husband requesting refill of KDur, Synthroid and Valium.  Notes indicate pt stopped K in July but husband reports that she still takes it and has one pill left.  Meds pended.

## 2019-08-23 NOTE — Telephone Encounter (Signed)
Requested medication (s) are due for refill today: yes  Requested medication (s) are on the active medication list: yes  Last refill:  02/01/2019  Future visit scheduled: yes  Notes to clinic:  Medication was discontinued    Requested Prescriptions  Pending Prescriptions Disp Refills   potassium chloride (K-DUR) 10 MEQ tablet [Pharmacy Med Name: Potassium Chloride Crys ER 10 MEQ Oral Tablet Extended Release] 90 tablet 0    Sig: TAKE [place]     Endocrinology:  Minerals - Potassium Supplementation Failed - 08/23/2019  2:28 PM      Failed - K in normal range and within 360 days    Potassium  Date Value Ref Range Status  08/14/2019 3.2 (L) 3.5 - 5.1 mmol/L Final         Failed - Cr in normal range and within 360 days    Creat  Date Value Ref Range Status  07/19/2013 0.85 0.50 - 1.10 mg/dL Final   Creatinine, Ser  Date Value Ref Range Status  08/14/2019 1.37 (H) 0.44 - 1.00 mg/dL Final         Passed - Valid encounter within last 12 months    Recent Outpatient Visits          2 months ago Bilateral shoulder pain, unspecified chronicity   [facility], Lilia Argue, MD   6 months ago Physical deconditioning   Primary [facility] at [place], [place], MD   9 months ago Uncontrolled type 2 diabetes mellitus with hyperglycemia [facility])   Primary Care at [place], [place], MD   10 months ago Type 2 diabetes mellitus with other specified complication, without long-term current use of insulin [facility])   Primary Care at [facility], [place], PA-C   1 year ago Adjustment disorder with mixed anxiety and depressed mood   Primary Care at [facility], Gelene Mink, PA-C      Future Appointments            In 1 week Sagardia, Ines Bloomer, MD [facility] at [place], [facility]   In 1 month Croitoru, Dani Gobble, MD [facility] [place], [facility]

## 2019-08-24 ENCOUNTER — Telehealth: Payer: Self-pay | Admitting: Emergency Medicine

## 2019-08-24 ENCOUNTER — Telehealth: Payer: Self-pay

## 2019-08-24 NOTE — Telephone Encounter (Signed)
On 09/23/2019, Chanda spoke to Dr Mitchel Honour concerning this patient.

## 2019-08-24 NOTE — Telephone Encounter (Signed)
LVM because pt wanted to speak to the person that does PA's. I told her to call back and hit option 2 for scheduling so she can be transferred to me.

## 2019-08-24 NOTE — Telephone Encounter (Signed)
Pt husband called to office to advise that provider needs to complete form for pt to be transported to appt tomorrow.  Does not have form.  Call to EMS who emailed form.  Form asks provider to sign off that pt is fully confined to bed.  Call to Mr. Pentland that we cannot sign off on form.  Discussed options for obtaining help to get ride/pt to vehicle but Mr. Flores does not feel this is feasible d/t limited help at home and the rainy weather.  States pt's sugar overnight was 500+ and he has not taken it again today.  Advised that this sugar is dangerous level and we would not be able to address in clinic -pt needs to go to ED.  Spoke with Dr. Mitchel Honour who confirmed that with husband's report of increased weakness as well as this blood sugar reading, pt needs to go to the hospital.  Mr. Iafrate advised and expresses understanding.  States it can't be today since it is raining.  Advised that EMS will respond to the call even in rain and encouraged him to take pt to ED.  Appt for 09/30 cancelled.

## 2019-08-25 ENCOUNTER — Other Ambulatory Visit: Payer: Self-pay | Admitting: Emergency Medicine

## 2019-08-25 ENCOUNTER — Ambulatory Visit: Payer: Self-pay | Admitting: Emergency Medicine

## 2019-08-25 NOTE — Telephone Encounter (Signed)
Requested medication (s) are due for refill today: yes  Requested medication (s) are on the active medication list: yes  Last refill: 02/22/2019  Future visit scheduled: no  Notes to clinic:  Review for refill   Requested Prescriptions  Pending Prescriptions Disp Refills   levothyroxine (SYNTHROID) 50 MCG tablet [Pharmacy Med Name: Levothyroxine Sodium 50 MCG Oral Tablet] 30 tablet 0    Sig: TAKE 1 TABLET BY MOUTH ONCE DAILY BEFORE BREAKFAST     Endocrinology:  Hypothyroid Agents Failed - 08/25/2019  3:13 PM      Failed - TSH needs to be rechecked within 3 months after an abnormal result. Refill until TSH is due.      Passed - TSH in normal range and within 360 days    TSH  Date Value Ref Range Status  07/26/2019 0.628 0.350 - 4.500 uIU/mL Final    Comment:    Performed by a 3rd Generation assay with a functional sensitivity of <=0.01 uIU/mL. Performed at [facility], [place] [address] [place], [place] [address]   02/19/2017 2.760 0.450 - 4.500 uIU/mL Final         Passed - Valid encounter within last 12 months    Recent Outpatient Visits          2 months ago Bilateral shoulder pain, unspecified chronicity   [facility], Lilia Argue, MD   6 months ago Physical deconditioning   Primary Care at [facility], [place], MD   9 months ago Uncontrolled type 2 diabetes mellitus with hyperglycemia [facility])   Primary Care at [facility], [place], MD   10 months ago Type 2 diabetes mellitus with other specified complication, without long-term current use of insulin [facility])   Primary Care at [facility], Gelene Mink, PA-C   1 year ago Adjustment disorder with mixed anxiety and depressed mood   Primary Care at [facility], Gelene Mink, PA-C      Future Appointments            In 1 month Croitoru, Dani Gobble, MD [facility] [place], [facility]

## 2019-08-26 ENCOUNTER — Ambulatory Visit: Payer: Medicare Other | Admitting: Pharmacist

## 2019-08-26 ENCOUNTER — Telehealth: Payer: Self-pay | Admitting: *Deleted

## 2019-08-26 ENCOUNTER — Other Ambulatory Visit: Payer: Self-pay | Admitting: Pharmacy Technician

## 2019-08-26 DIAGNOSIS — I69351 Hemiplegia and hemiparesis following cerebral infarction affecting right dominant side: Secondary | ICD-10-CM | POA: Diagnosis not present

## 2019-08-26 DIAGNOSIS — I69320 Aphasia following cerebral infarction: Secondary | ICD-10-CM | POA: Diagnosis not present

## 2019-08-26 DIAGNOSIS — I5032 Chronic diastolic (congestive) heart failure: Secondary | ICD-10-CM | POA: Diagnosis not present

## 2019-08-26 DIAGNOSIS — I110 Hypertensive heart disease with heart failure: Secondary | ICD-10-CM | POA: Diagnosis not present

## 2019-08-26 DIAGNOSIS — I951 Orthostatic hypotension: Secondary | ICD-10-CM | POA: Diagnosis not present

## 2019-08-26 DIAGNOSIS — I2510 Atherosclerotic heart disease of native coronary artery without angina pectoris: Secondary | ICD-10-CM | POA: Diagnosis not present

## 2019-08-26 NOTE — Telephone Encounter (Signed)
Faxed Rx for order to [employer] 14 day insulin. Confirmation page 6:01 pm.

## 2019-08-26 NOTE — Patient Outreach (Signed)
[facility]) Care Management  08/26/2019  [name] 08/18/1941 [id number]   Received all necessary documents and signatures from both patient and provider for [name] patient assistance for [place].  Submitted completed application via fax.  Will followup with [name] in 5-10 business days to inquire on status of application.  Joselynn Amoroso P. Jamesyn Lindell, [place] Management [phone]

## 2019-08-27 ENCOUNTER — Telehealth: Payer: Self-pay | Admitting: Emergency Medicine

## 2019-08-27 DIAGNOSIS — I69351 Hemiplegia and hemiparesis following cerebral infarction affecting right dominant side: Secondary | ICD-10-CM | POA: Diagnosis not present

## 2019-08-27 DIAGNOSIS — I5032 Chronic diastolic (congestive) heart failure: Secondary | ICD-10-CM | POA: Diagnosis not present

## 2019-08-27 DIAGNOSIS — I110 Hypertensive heart disease with heart failure: Secondary | ICD-10-CM | POA: Diagnosis not present

## 2019-08-27 DIAGNOSIS — I951 Orthostatic hypotension: Secondary | ICD-10-CM | POA: Diagnosis not present

## 2019-08-27 DIAGNOSIS — I2510 Atherosclerotic heart disease of native coronary artery without angina pectoris: Secondary | ICD-10-CM | POA: Diagnosis not present

## 2019-08-27 DIAGNOSIS — I69320 Aphasia following cerebral infarction: Secondary | ICD-10-CM | POA: Diagnosis not present

## 2019-08-27 NOTE — Telephone Encounter (Signed)
[facility] called .callers name is Princess . They received prescription for glucose Monitor . DX code is missing . They faxed form over to be filled out . [phone]

## 2019-08-27 NOTE — Telephone Encounter (Signed)
Please advise 

## 2019-08-28 NOTE — Telephone Encounter (Signed)
Will take care of this. Thanks!

## 2019-08-30 ENCOUNTER — Encounter: Payer: Self-pay | Admitting: Emergency Medicine

## 2019-08-30 ENCOUNTER — Inpatient Hospital Stay: Payer: Medicare Other | Admitting: Emergency Medicine

## 2019-08-30 ENCOUNTER — Telehealth: Payer: Self-pay | Admitting: *Deleted

## 2019-08-30 ENCOUNTER — Ambulatory Visit (INDEPENDENT_AMBULATORY_CARE_PROVIDER_SITE_OTHER): Payer: Medicare Other | Admitting: Emergency Medicine

## 2019-08-30 ENCOUNTER — Other Ambulatory Visit: Payer: Self-pay

## 2019-08-30 VITALS — BP 104/67 | HR 100 | Temp 98.4°F

## 2019-08-30 DIAGNOSIS — I4891 Unspecified atrial fibrillation: Secondary | ICD-10-CM

## 2019-08-30 DIAGNOSIS — I10 Essential (primary) hypertension: Secondary | ICD-10-CM

## 2019-08-30 DIAGNOSIS — M2550 Pain in unspecified joint: Secondary | ICD-10-CM | POA: Diagnosis not present

## 2019-08-30 DIAGNOSIS — Z7901 Long term (current) use of anticoagulants: Secondary | ICD-10-CM

## 2019-08-30 DIAGNOSIS — R5381 Other malaise: Secondary | ICD-10-CM | POA: Diagnosis not present

## 2019-08-30 DIAGNOSIS — I469 Cardiac arrest, cause unspecified: Secondary | ICD-10-CM | POA: Diagnosis not present

## 2019-08-30 DIAGNOSIS — Z794 Long term (current) use of insulin: Secondary | ICD-10-CM | POA: Diagnosis not present

## 2019-08-30 DIAGNOSIS — Z23 Encounter for immunization: Secondary | ICD-10-CM | POA: Diagnosis not present

## 2019-08-30 DIAGNOSIS — G459 Transient cerebral ischemic attack, unspecified: Secondary | ICD-10-CM | POA: Diagnosis not present

## 2019-08-30 DIAGNOSIS — Z8673 Personal history of transient ischemic attack (TIA), and cerebral infarction without residual deficits: Secondary | ICD-10-CM

## 2019-08-30 DIAGNOSIS — E118 Type 2 diabetes mellitus with unspecified complications: Secondary | ICD-10-CM

## 2019-08-30 DIAGNOSIS — G894 Chronic pain syndrome: Secondary | ICD-10-CM

## 2019-08-30 DIAGNOSIS — Z7401 Bed confinement status: Secondary | ICD-10-CM | POA: Diagnosis not present

## 2019-08-30 DIAGNOSIS — R531 Weakness: Secondary | ICD-10-CM | POA: Diagnosis not present

## 2019-08-30 LAB — POCT CBC
Granulocyte percent: 75.9 %G (ref 37–80)
HCT, POC: 37.2 % (ref 29–41)
Hemoglobin: 12.2 g/dL (ref 11–14.6)
Lymph, poc: 1.4 (ref 0.6–3.4)
MCH, POC: 26.1 pg — AB (ref 27–31.2)
MCHC: 32.7 g/dL (ref 31.8–35.4)
MCV: 79.9 fL (ref 76–111)
MID (cbc): 0.5 (ref 0–0.9)
MPV: 6.8 fL (ref 0–99.8)
POC Granulocyte: 6.1 (ref 2–6.9)
POC LYMPH PERCENT: 17.4 %L (ref 10–50)
POC MID %: 6.7 %M (ref 0–12)
Platelet Count, POC: 294 10*3/uL (ref 142–424)
RBC: 4.66 M/uL (ref 4.04–5.48)
RDW, POC: 15.6 %
WBC: 8 10*3/uL (ref 4.6–10.2)

## 2019-08-30 LAB — POCT GLYCOSYLATED HEMOGLOBIN (HGB A1C): Hemoglobin A1C: 11.5 % — AB (ref 4.0–5.6)

## 2019-08-30 LAB — GLUCOSE, POCT (MANUAL RESULT ENTRY): POC Glucose: 198 mg/dl — AB (ref 70–99)

## 2019-08-30 MED ORDER — TRULICITY 0.75 MG/0.5ML ~~LOC~~ SOAJ: 0.7500 mg | SUBCUTANEOUS | 5 refills | Status: DC

## 2019-08-30 NOTE — Telephone Encounter (Signed)
Faxed order for [employer] 14 day reader and sensors, reference # [id number]. Confirmation page received 8:49 am.

## 2019-08-30 NOTE — Assessment & Plan Note (Signed)
Uncontrolled diabetes with hemoglobin A1c at 11.5.  Home glucose numbers consistently elevated in the morning and in the afternoon.  Home chart reviewed.  No episodes of hypoglycemia noted.  Will increase bedtime insulin to 30 units.  Continue metformin 500 mg twice a day and add Trulicity 9.37 mg weekly.  Follow-up in 3 months.
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
## 2019-08-30 NOTE — Telephone Encounter (Signed)
Faxed

## 2019-08-30 NOTE — Patient Instructions (Addendum)
Increase bedtime insulin to 30 units. Continue metformin 500 mg twice a day. Start Trulicity 0.75 mg weekly. Stop hydrochlorothiazide.  If you have lab work done today you will be contacted with your lab results within the next 2 weeks.  If you have not heard from [place] then please contact us. The fastest way to get your results is to register for My Chart.   IF you received an x-ray today, you will receive an invoice from [facility]. Please contact [facility] at [phone] with questions or concerns regarding your invoice.   IF you received labwork today, you will receive an invoice from [name]. Please contact [facility] at [phone] with questions or concerns regarding your invoice.   Our billing staff will not be able to assist you with questions regarding bills from these companies.  You will be contacted with the lab results as soon as they are available. The fastest way to get your results is to activate your My Chart account. Instructions are located on the last page of this paperwork. If you have not heard from [place] regarding the results in 2 weeks, please contact this office.     Diabetes Mellitus and Nutrition, Adult When you have diabetes (diabetes mellitus), it is very important to have healthy eating habits because your blood sugar (glucose) levels are greatly affected by what you eat and drink. Eating healthy foods in the appropriate amounts, at about the same times every day, can help you:  Control your blood glucose.  Lower your risk of heart disease.  Improve your blood pressure.  Reach or maintain a healthy weight. Every person with diabetes is different, and each person has different needs for a meal plan. Your health care provider may recommend that you work with a diet and nutrition specialist (dietitian) to make a meal plan that is best for you. Your meal plan may vary depending on factors such as:  The calories you need.  The medicines you  take.  Your weight.  Your blood glucose, blood pressure, and cholesterol levels.  Your activity level.  Other health conditions you have, such as heart or kidney disease. How do carbohydrates affect me? Carbohydrates, also called carbs, affect your blood glucose level more than any other type of food. Eating carbs naturally raises the amount of glucose in your blood. Carb counting is a method for keeping track of how many carbs you eat. Counting carbs is important to keep your blood glucose at a healthy level, especially if you use insulin or take certain oral diabetes medicines. It is important to know how many carbs you can safely have in each meal. This is different for every person. Your dietitian can help you calculate how many carbs you should have at each meal and for each snack. Foods that contain carbs include:  Bread, cereal, rice, pasta, and crackers.  Potatoes and corn.  Peas, beans, and lentils.  Milk and yogurt.  Fruit and juice.  Desserts, such as cakes, cookies, ice cream, and candy. How does alcohol affect me? Alcohol can cause a sudden decrease in blood glucose (hypoglycemia), especially if you use insulin or take certain oral diabetes medicines. Hypoglycemia can be a life-threatening condition. Symptoms of hypoglycemia (sleepiness, dizziness, and confusion) are similar to symptoms of having too much alcohol. If your health care provider says that alcohol is safe for you, follow these guidelines:  Limit alcohol intake to no more than 1 drink per day for nonpregnant women and 2 drinks per day for  men. One drink equals 12 oz of beer, 5 oz of wine, or 1 oz of hard liquor.  Do not drink on an empty stomach.  Keep yourself hydrated with water, diet soda, or unsweetened iced tea.  Keep in mind that regular soda, juice, and other mixers may contain a lot of sugar and must be counted as carbs. What are tips for following this plan?  Reading food labels  Start by  checking the serving size on the "Nutrition Facts" label of packaged foods and drinks. The amount of calories, carbs, fats, and other nutrients listed on the label is based on one serving of the item. Many items contain more than one serving per package.  Check the total grams (g) of carbs in one serving. You can calculate the number of servings of carbs in one serving by dividing the total carbs by 15. For example, if a food has 30 g of total carbs, it would be equal to 2 servings of carbs.  Check the number of grams (g) of saturated and trans fats in one serving. Choose foods that have low or no amount of these fats.  Check the number of milligrams (mg) of salt (sodium) in one serving. Most people should limit total sodium intake to less than 2,300 mg per day.  Always check the nutrition information of foods labeled as "low-fat" or "nonfat". These foods may be higher in added sugar or refined carbs and should be avoided.  Talk to your dietitian to identify your daily goals for nutrients listed on the label. Shopping  Avoid buying canned, premade, or processed foods. These foods tend to be high in fat, sodium, and added sugar.  Shop around the outside edge of the grocery store. This includes fresh fruits and vegetables, bulk grains, fresh meats, and fresh dairy. Cooking  Use low-heat cooking methods, such as baking, instead of high-heat cooking methods like deep frying.  Cook using healthy oils, such as olive, canola, or sunflower oil.  Avoid cooking with butter, cream, or high-fat meats. Meal planning  Eat meals and snacks regularly, preferably at the same times every day. Avoid going long periods of time without eating.  Eat foods high in fiber, such as fresh fruits, vegetables, beans, and whole grains. Talk to your dietitian about how many servings of carbs you can eat at each meal.  Eat 4-6 ounces (oz) of lean protein each day, such as lean meat, chicken, fish, eggs, or tofu. One oz  of lean protein is equal to: ? 1 oz of meat, chicken, or fish. ? 1 egg. ?  cup of tofu.  Eat some foods each day that contain healthy fats, such as avocado, nuts, seeds, and fish. Lifestyle  Check your blood glucose regularly.  Exercise regularly as told by your health care provider. This may include: ? 150 minutes of moderate-intensity or vigorous-intensity exercise each week. This could be brisk walking, biking, or water aerobics. ? Stretching and doing strength exercises, such as yoga or weightlifting, at least 2 times a week.  Take medicines as told by your health care provider.  Do not use any products that contain nicotine or tobacco, such as cigarettes and e-cigarettes. If you need help quitting, ask your health care provider.  Work with a Veterinary surgeoncounselor or diabetes educator to identify strategies to manage stress and any emotional and social challenges. Questions to ask a health care provider  Do I need to meet with a diabetes educator?  Do I need to meet with  a dietitian?  What number can I call if I have questions?  When are the best times to check my blood glucose? Where to find more information:  American Diabetes Association: diabetes.org  Academy of Nutrition and Dietetics: www.eatright.[employer] of Diabetes and Digestive and Kidney Diseases (NIH): [employer] Summary  A healthy meal plan will help you control your blood glucose and maintain a healthy lifestyle.  Working with a diet and nutrition specialist (dietitian) can help you make a meal plan that is best for you.  Keep in mind that carbohydrates (carbs) and alcohol have immediate effects on your blood glucose levels. It is important to count carbs and to use alcohol carefully. This information is not intended to replace advice given to you by your health care provider. Make sure you discuss any questions you have with your health care provider. Document Released: 08/08/2005 Document  Revised: 10/24/2017 Document Reviewed: 12/16/2016 Elsevier Patient Education  2020 [employer].

## 2019-08-31 ENCOUNTER — Emergency Department (HOSPITAL_COMMUNITY): Payer: Medicare Other

## 2019-08-31 ENCOUNTER — Emergency Department (HOSPITAL_COMMUNITY)
Admission: EM | Admit: 2019-08-31 | Discharge: 2019-09-07 | Payer: Medicare Other | Attending: Emergency Medicine | Admitting: Emergency Medicine

## 2019-08-31 ENCOUNTER — Other Ambulatory Visit: Payer: Self-pay

## 2019-08-31 ENCOUNTER — Telehealth: Payer: Self-pay | Admitting: *Deleted

## 2019-08-31 ENCOUNTER — Other Ambulatory Visit: Payer: Self-pay | Admitting: Pharmacy Technician

## 2019-08-31 ENCOUNTER — Encounter (HOSPITAL_COMMUNITY): Payer: Self-pay

## 2019-08-31 ENCOUNTER — Telehealth: Payer: Self-pay | Admitting: Emergency Medicine

## 2019-08-31 DIAGNOSIS — Z20828 Contact with and (suspected) exposure to other viral communicable diseases: Secondary | ICD-10-CM | POA: Insufficient documentation

## 2019-08-31 DIAGNOSIS — R296 Repeated falls: Secondary | ICD-10-CM | POA: Insufficient documentation

## 2019-08-31 DIAGNOSIS — I110 Hypertensive heart disease with heart failure: Secondary | ICD-10-CM | POA: Insufficient documentation

## 2019-08-31 DIAGNOSIS — R0902 Hypoxemia: Secondary | ICD-10-CM | POA: Diagnosis not present

## 2019-08-31 DIAGNOSIS — Z79899 Other long term (current) drug therapy: Secondary | ICD-10-CM | POA: Insufficient documentation

## 2019-08-31 DIAGNOSIS — Z7982 Long term (current) use of aspirin: Secondary | ICD-10-CM | POA: Insufficient documentation

## 2019-08-31 DIAGNOSIS — E039 Hypothyroidism, unspecified: Secondary | ICD-10-CM | POA: Insufficient documentation

## 2019-08-31 DIAGNOSIS — Z955 Presence of coronary angioplasty implant and graft: Secondary | ICD-10-CM | POA: Diagnosis not present

## 2019-08-31 DIAGNOSIS — I2510 Atherosclerotic heart disease of native coronary artery without angina pectoris: Secondary | ICD-10-CM | POA: Diagnosis not present

## 2019-08-31 DIAGNOSIS — Z8673 Personal history of transient ischemic attack (TIA), and cerebral infarction without residual deficits: Secondary | ICD-10-CM | POA: Insufficient documentation

## 2019-08-31 DIAGNOSIS — I5032 Chronic diastolic (congestive) heart failure: Secondary | ICD-10-CM | POA: Insufficient documentation

## 2019-08-31 DIAGNOSIS — S0990XA Unspecified injury of head, initial encounter: Secondary | ICD-10-CM | POA: Diagnosis not present

## 2019-08-31 DIAGNOSIS — Y92009 Unspecified place in unspecified non-institutional (private) residence as the place of occurrence of the external cause: Secondary | ICD-10-CM | POA: Insufficient documentation

## 2019-08-31 DIAGNOSIS — Z794 Long term (current) use of insulin: Secondary | ICD-10-CM | POA: Insufficient documentation

## 2019-08-31 DIAGNOSIS — W1811XA Fall from or off toilet without subsequent striking against object, initial encounter: Secondary | ICD-10-CM | POA: Diagnosis not present

## 2019-08-31 DIAGNOSIS — Y9389 Activity, other specified: Secondary | ICD-10-CM | POA: Diagnosis not present

## 2019-08-31 DIAGNOSIS — Y999 Unspecified external cause status: Secondary | ICD-10-CM | POA: Insufficient documentation

## 2019-08-31 DIAGNOSIS — R531 Weakness: Secondary | ICD-10-CM | POA: Diagnosis not present

## 2019-08-31 DIAGNOSIS — M545 Low back pain: Secondary | ICD-10-CM | POA: Diagnosis not present

## 2019-08-31 DIAGNOSIS — E119 Type 2 diabetes mellitus without complications: Secondary | ICD-10-CM | POA: Insufficient documentation

## 2019-08-31 DIAGNOSIS — R5381 Other malaise: Secondary | ICD-10-CM

## 2019-08-31 DIAGNOSIS — W19XXXA Unspecified fall, initial encounter: Secondary | ICD-10-CM

## 2019-08-31 DIAGNOSIS — I499 Cardiac arrhythmia, unspecified: Secondary | ICD-10-CM | POA: Diagnosis not present

## 2019-08-31 DIAGNOSIS — M5489 Other dorsalgia: Secondary | ICD-10-CM | POA: Diagnosis not present

## 2019-08-31 DIAGNOSIS — I4891 Unspecified atrial fibrillation: Secondary | ICD-10-CM | POA: Diagnosis not present

## 2019-08-31 LAB — CBC WITH DIFFERENTIAL/PLATELET
Abs Immature Granulocytes: 0.04 10*3/uL (ref 0.00–0.07)
Basophils Absolute: 0 10*3/uL (ref 0.0–0.1)
Basophils Relative: 1 %
Eosinophils Absolute: 0.1 10*3/uL (ref 0.0–0.5)
Eosinophils Relative: 1 %
HCT: 37.1 % (ref 36.0–46.0)
Hemoglobin: 12.1 g/dL (ref 12.0–15.0)
Immature Granulocytes: 1 %
Lymphocytes Relative: 21 %
Lymphs Abs: 1.6 10*3/uL (ref 0.7–4.0)
MCH: 27.1 pg (ref 26.0–34.0)
MCHC: 32.6 g/dL (ref 30.0–36.0)
MCV: 83 fL (ref 80.0–100.0)
Monocytes Absolute: 0.7 10*3/uL (ref 0.1–1.0)
Monocytes Relative: 9 %
Neutro Abs: 5.2 10*3/uL (ref 1.7–7.7)
Neutrophils Relative %: 67 %
Platelets: 259 10*3/uL (ref 150–400)
RBC: 4.47 MIL/uL (ref 3.87–5.11)
RDW: 14.6 % (ref 11.5–15.5)
WBC: 7.6 10*3/uL (ref 4.0–10.5)
nRBC: 0 % (ref 0.0–0.2)

## 2019-08-31 LAB — URINALYSIS, ROUTINE W REFLEX MICROSCOPIC
Bacteria, UA: NONE SEEN
Bilirubin Urine: NEGATIVE
Glucose, UA: 50 mg/dL — AB
Ketones, ur: NEGATIVE mg/dL
Leukocytes,Ua: NEGATIVE
Nitrite: NEGATIVE
Protein, ur: NEGATIVE mg/dL
Specific Gravity, Urine: 1.015 (ref 1.005–1.030)
pH: 5 (ref 5.0–8.0)

## 2019-08-31 LAB — COMPREHENSIVE METABOLIC PANEL
ALT: 19 IU/L (ref 0–32)
ALT: 22 U/L (ref 0–44)
AST: 25 IU/L (ref 0–40)
AST: 40 U/L (ref 15–41)
Albumin/Globulin Ratio: 1.5 (ref 1.2–2.2)
Albumin: 4 g/dL (ref 3.7–4.7)
Albumin: 3.5 g/dL (ref 3.5–5.0)
Alkaline Phosphatase: 110 IU/L (ref 39–117)
Alkaline Phosphatase: 88 U/L (ref 38–126)
Anion gap: 14 (ref 5–15)
BUN/Creatinine Ratio: 18 (ref 12–28)
BUN: 22 mg/dL (ref 8–27)
BUN: 21 mg/dL (ref 8–23)
Bilirubin Total: 0.6 mg/dL (ref 0.0–1.2)
CO2: 24 mmol/L (ref 20–29)
CO2: 24 mmol/L (ref 22–32)
Calcium: 10 mg/dL (ref 8.7–10.3)
Calcium: 9.6 mg/dL (ref 8.9–10.3)
Chloride: 98 mmol/L (ref 96–106)
Chloride: 96 mmol/L — ABNORMAL LOW (ref 98–111)
Creatinine, Ser: 1.19 mg/dL — ABNORMAL HIGH (ref 0.57–1.00)
Creatinine, Ser: 1.09 mg/dL — ABNORMAL HIGH (ref 0.44–1.00)
GFR calc Af Amer: 50 mL/min/{1.73_m2} — ABNORMAL LOW (ref >59)
GFR calc Af Amer: 56 mL/min — ABNORMAL LOW (ref >60)
GFR calc non Af Amer: 44 mL/min/{1.73_m2} — ABNORMAL LOW (ref >59)
GFR calc non Af Amer: 48 mL/min — ABNORMAL LOW (ref >60)
Globulin, Total: 2.6 g/dL (ref 1.5–4.5)
Glucose, Bld: 189 mg/dL — ABNORMAL HIGH (ref 70–99)
Glucose: 206 mg/dL — ABNORMAL HIGH (ref 65–99)
Potassium: 4.4 mmol/L (ref 3.5–5.2)
Potassium: 4.1 mmol/L (ref 3.5–5.1)
Sodium: 138 mmol/L (ref 134–144)
Sodium: 134 mmol/L — ABNORMAL LOW (ref 135–145)
Total Bilirubin: 1.6 mg/dL — ABNORMAL HIGH (ref 0.3–1.2)
Total Protein: 6.6 g/dL (ref 6.0–8.5)
Total Protein: 6.5 g/dL (ref 6.5–8.1)

## 2019-08-31 LAB — SARS CORONAVIRUS 2 (TAT 6-24 HRS): SARS Coronavirus 2: NEGATIVE

## 2019-08-31 LAB — PROTIME-INR
INR: 1.1 (ref 0.8–1.2)
Prothrombin Time: 14 seconds (ref 11.4–15.2)

## 2019-08-31 MED ORDER — SODIUM CHLORIDE 0.9 % IV BOLUS
500.0000 mL | Freq: Once | INTRAVENOUS | Status: AC
  Administered 2019-08-31: 15:00:00 500 mL via INTRAVENOUS

## 2019-08-31 MED ORDER — APIXABAN 5 MG PO TABS
5.0000 mg | ORAL_TABLET | Freq: Two times a day (BID) | ORAL | Status: DC
  Administered 2019-09-01 – 2019-09-07 (×14): 5 mg via ORAL
  Filled 2019-08-31 (×14): qty 1

## 2019-08-31 MED ORDER — VITAMIN D 25 MCG (1000 UNIT) PO TABS
5000.0000 [IU] | ORAL_TABLET | Freq: Every day | ORAL | Status: DC
  Administered 2019-09-01 – 2019-09-07 (×8): 5000 [IU] via ORAL
  Filled 2019-08-31 (×8): qty 5

## 2019-08-31 MED ORDER — ASPIRIN 81 MG PO CHEW
81.0000 mg | CHEWABLE_TABLET | Freq: Every day | ORAL | Status: DC
  Administered 2019-09-01 – 2019-09-07 (×8): 81 mg via ORAL
  Filled 2019-08-31 (×8): qty 1

## 2019-08-31 MED ORDER — ATORVASTATIN CALCIUM 40 MG PO TABS
40.0000 mg | ORAL_TABLET | Freq: Every day | ORAL | Status: DC
  Administered 2019-09-01 – 2019-09-06 (×7): 40 mg via ORAL
  Filled 2019-08-31 (×7): qty 1

## 2019-08-31 MED ORDER — LISINOPRIL 10 MG PO TABS: 10.0000 mg | ORAL_TABLET | ORAL | Status: DC

## 2019-08-31 MED ORDER — SODIUM CHLORIDE 0.9 % IV SOLN
INTRAVENOUS | Status: DC
  Administered 2019-08-31: 15:00:00 125 mL/h via INTRAVENOUS

## 2019-08-31 MED ORDER — INSULIN GLARGINE 100 UNIT/ML ~~LOC~~ SOLN
24.0000 [IU] | Freq: Every day | SUBCUTANEOUS | Status: DC
  Administered 2019-09-01 – 2019-09-06 (×7): 24 [IU] via SUBCUTANEOUS
  Filled 2019-08-31 (×8): qty 0.24

## 2019-08-31 MED ORDER — HYDROCODONE-ACETAMINOPHEN 10-325 MG PO TABS
1.0000 | ORAL_TABLET | ORAL | Status: DC | PRN
  Administered 2019-09-02 (×2): 1 via ORAL
  Administered 2019-09-02: 2 via ORAL
  Administered 2019-09-03 (×2): 1 via ORAL
  Administered 2019-09-03: 2 via ORAL
  Administered 2019-09-04 – 2019-09-05 (×6): 1 via ORAL
  Administered 2019-09-06: 2 via ORAL
  Administered 2019-09-06 – 2019-09-07 (×3): 1 via ORAL
  Filled 2019-08-31: qty 1
  Filled 2019-08-31: qty 2
  Filled 2019-08-31: qty 1
  Filled 2019-08-31: qty 2
  Filled 2019-08-31 (×8): qty 1
  Filled 2019-08-31: qty 2
  Filled 2019-08-31 (×3): qty 1

## 2019-08-31 MED ORDER — DULAGLUTIDE 0.75 MG/0.5ML ~~LOC~~ SOAJ: 0.7500 mg | SUBCUTANEOUS | Status: DC

## 2019-08-31 MED ORDER — POTASSIUM CHLORIDE CRYS ER 10 MEQ PO TBCR
10.0000 meq | EXTENDED_RELEASE_TABLET | Freq: Every day | ORAL | Status: DC
  Administered 2019-09-01 – 2019-09-07 (×8): 10 meq via ORAL
  Filled 2019-08-31 (×8): qty 1

## 2019-08-31 MED ORDER — VENLAFAXINE HCL ER 150 MG PO CP24
150.0000 mg | ORAL_CAPSULE | Freq: Every day | ORAL | Status: DC
  Administered 2019-09-01 – 2019-09-07 (×7): 150 mg via ORAL
  Filled 2019-08-31 (×7): qty 1

## 2019-08-31 MED ORDER — LEVOTHYROXINE SODIUM 50 MCG PO TABS
50.0000 ug | ORAL_TABLET | Freq: Every day | ORAL | Status: DC
  Administered 2019-09-01 – 2019-09-07 (×7): 50 ug via ORAL
  Filled 2019-08-31 (×7): qty 1

## 2019-08-31 NOTE — ED Notes (Signed)
Called CT, no answer.

## 2019-08-31 NOTE — ED Provider Notes (Signed)
  Provider Note MRN:  [id number]  Arrival date & time: 08/31/19    ED Course and Medical Decision Making  Assumed care from Dr. Eulis Foster at shift change.  Frequent falls, needs placement, no indication for admission, awaiting CT head, if nonacute will hold in the ED until placed.  Social work/case management aware.  6:45 PM update: CT head is reassuring, patient is medically cleared, awaiting placement.  Signed out to default provider.  Final Clinical Impressions(s) / ED Diagnoses     ICD-10-CM   1. Fall in home, initial encounter  W19.XXXA    Y92.009   2. Physical deconditioning  R53.81     ED Discharge Orders    None      Discharge Instructions   None     Barth Kirks. [place] Small, [place] [email or web address]    Maudie Flakes, MD 08/31/19 [phone]

## 2019-08-31 NOTE — Telephone Encounter (Signed)
Faxed home health orders to Attn: Hessie Knows. Confirmation page 8:40 am.

## 2019-08-31 NOTE — Care Management (Signed)
TOC will continue to follow for SNF placement work up.

## 2019-08-31 NOTE — ED Notes (Signed)
[place] patient should be in a nursing home place call her  [phone]

## 2019-08-31 NOTE — Telephone Encounter (Signed)
On 08/30/2019, order for [employer] supplies faxed. Confirmation page 3:23 pm.

## 2019-08-31 NOTE — ED Triage Notes (Signed)
[facility] reports weakness x2 weeks, with ~3 falls in the last 48 hours, dried blood noted on hat. Pt lives with husband. Pt has PT twice weekly. Pt has chronic back and shoulder pain. Husband reported 400 cbg, ems 187. Pt in [place] (onset?, allergy to Cardizem) Pt is conversational, with A&O x 2-4
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
## 2019-08-31 NOTE — Patient Outreach (Signed)
[facility]) Care Management  08/31/2019  DUSTIE BRITTLE Jul 06, 1941 [id number]    ADDENDUM  Upon review of chart, patient has not seen Dr. Sallyanne Kuster in a few months.  Patient assistance form with BMS for Eliquis will be faxed to Dr. Barry Brunner office who is the patient's PCP.  Will await a return fax from Dr. Barry Brunner office. Once received will submit BMS application.  Alante Tolan P. Ethyl Vila, [place] [facility] [phone]

## 2019-08-31 NOTE — Evaluation (Signed)
Physical Therapy Evaluation Patient Details Name: [name] MRN: [id number] DOB: 1941/01/12 Today's Date: 08/31/2019   History of Present Illness  Pt is a 78 y/o female presenting to the ED secondary to multiple falls. Workup pending. PMH includes DM, CAD s/p stent, HTN, bells palsy, CVA, and obesity.  Clinical Impression  Pt presenting to ED with problem above and deficits below. Pt with cognitive deficits and intermittent difficulty with word finding. Required max A to perform bed mobility tasks and to maintain sitting balance at edge of stretcher. Unsure of accuracy of PLOF and home information, however, per notes, pt with multiple falls at home. Feel she will require SNF level therapy at d/c. Will continue to follow acutely to maximize functional mobility independence and safety.     Follow Up Recommendations SNF;Supervision/Assistance - 24 hour    Equipment Recommendations  Other (comment)(TBD)    Recommendations for Other Services       Precautions / Restrictions Precautions Precautions: Fall Precaution Comments: Per chart, multiple falls at home.  Restrictions Weight Bearing Restrictions: No      Mobility  Bed Mobility Overal bed mobility: Needs Assistance Bed Mobility: Supine to Sit;Sit to Supine     Supine to sit: Max assist Sit to supine: Max assist   General bed mobility comments: Max A for basic bed mobility tasks. Required max A to maintain sitting balance at edge of stretcher. Unsafe to attempt further mobility with +1 so returned to supine.   Transfers                 General transfer comment: Unable   Ambulation/Gait                Stairs            Wheelchair Mobility    Modified Rankin (Stroke Patients Only)       Balance Overall balance assessment: Needs assistance Sitting-balance support: Feet unsupported;Bilateral upper extremity supported Sitting balance-Leahy Scale: Poor Sitting balance - Comments: Reliant on max  A for sitting balance at edge of stretcher                                     Pertinent Vitals/Pain Pain Assessment: Faces Faces Pain Scale: Hurts even more Pain Location: generalized Pain Descriptors / Indicators: Grimacing;Guarding Pain Intervention(s): Monitored during session;Limited activity within patient's tolerance;Repositioned    Home Living Family/patient expects to be discharged to:: Private residence Living Arrangements: Spouse/significant other Available Help at Discharge: Family Type of Home: House Home Access: Stairs to enter Entrance Stairs-Rails: None Entrance Stairs-Number of Steps: 4 Home Layout: One level Home Equipment: Environmental consultant - 2 wheels;Cane - single point Additional Comments: Unsure of accuracy given cognitive deficits.     Prior Function Level of Independence: Needs assistance   Gait / Transfers Assistance Needed: Pt reports using cane initially, however, then reports using stub? Attempted to clarify, however, pt kept reporting stub.   ADL's / Homemaking Assistance Needed: Pt reports needed assist with sponge bathing and dressing.         Hand Dominance        Extremity/Trunk Assessment   Upper Extremity Assessment Upper Extremity Assessment: Generalized weakness    Lower Extremity Assessment Lower Extremity Assessment: Generalized weakness    Cervical / Trunk Assessment Cervical / Trunk Assessment: Kyphotic  Communication   Communication: Expressive difficulties(intermittent word finding difficulty)  Cognition Arousal/Alertness: Awake/alert Behavior During Therapy: [facility]  for tasks assessed/performed Overall Cognitive Status: No family/caregiver present to determine baseline cognitive functioning                                 General Comments: Pt with intermittent word finding difficulty; i.e. could not remember what "knees" were called. Pt disoriented to time. Confusion and slowed processing noted.        General Comments General comments (skin integrity, edema, etc.): No family present     Exercises     Assessment/Plan    PT Assessment Patient needs continued PT services  PT Problem List Decreased strength;Decreased balance;Decreased mobility;Decreased cognition;Decreased knowledge of use of DME;Decreased safety awareness;Decreased knowledge of precautions       PT Treatment Interventions DME instruction;Gait training;Functional mobility training;Therapeutic activities;Therapeutic exercise;Balance training;Patient/family education    PT Goals (Current goals can be found in the Care Plan section)  Acute Rehab PT Goals PT Goal Formulation: Patient unable to participate in goal setting Time For Goal Achievement: 09/14/19 Potential to Achieve Goals: Fair    Frequency Min 2X/week   Barriers to discharge Other (comment) Unsure of caregiver support     Co-evaluation               AM-PAC PT "6 Clicks" Mobility  Outcome Measure Help needed turning from your back to your side while in a flat bed without using bedrails?: Total Help needed moving from lying on your back to sitting on the side of a flat bed without using bedrails?: Total Help needed moving to and from a bed to a chair (including a wheelchair)?: Total Help needed standing up from a chair using your arms (e.g., wheelchair or bedside chair)?: Total Help needed to walk in hospital room?: Total Help needed climbing 3-5 steps with a railing? : Total 6 Click Score: 6    End of Session   Activity Tolerance: Patient tolerated treatment well Patient left: in bed;with call bell/phone within reach Nurse Communication: Mobility status PT Visit Diagnosis: History of falling (Z91.81);Muscle weakness (generalized) (M62.81);Repeated falls (R29.6);Unsteadiness on feet (R26.81)    Time: 4403-4742 PT Time Calculation (min) (ACUTE ONLY): 16 min   Charges:   PT Evaluation $PT Eval Moderate Complexity: 1 Mod           Leighton Ruff, PT, DPT  Acute Rehabilitation Services  Pager: [phone] Office: [phone]   Rudean Hitt 08/31/2019, 4:46 PM

## 2019-08-31 NOTE — Patient Outreach (Deleted)
[facility]) Care Management  08/31/2019  [name] 08-Jan-1941 [id number]    Care coordination in basket message sent to Caprice Beaver, LPN at Dr. Dani Gobble Croitoru's office in regards to patient's application with BMS for Eliquis.  Sent inbasket message inquiring about the provider's portion of the BMS application that has been faxed to the office twice once on 08/05/2019 and again on 08/19/2019.   We are in need of the provider's portion to put with the patient's portion to submit altogether to the patient assistance company.  Will await a return in basket.  Gurjot Brisco P. Senora Lacson, [place] Management [phone]

## 2019-08-31 NOTE — ED Notes (Signed)
[phone] pts husband [name]

## 2019-08-31 NOTE — Progress Notes (Signed)
Pt awaiting PASRR manual review. Will submit documentation upon request.   Maressa Apollo Montel Clock Transitions of Care  Clinical Social Worker  Ph: [phone]

## 2019-08-31 NOTE — ED Notes (Signed)
Call placed to CT

## 2019-08-31 NOTE — Telephone Encounter (Signed)
Pt's husband called stating he missed a call concerning his wife. He would like a cb. Please advise at [phone]

## 2019-08-31 NOTE — ED Notes (Signed)
Pt given food and drink. EDP put in diet for pt.

## 2019-08-31 NOTE — Patient Outreach (Signed)
[facility]) Care Management  08/31/2019  [name] [birth year] [id number]    ADDENDUM  Received all necessary documents and signatures from both patient and provider for BMS patient assistance for Eliquis.  Submitted completed application via fax to BMS.  Will followup with BMS in 5-10 business days to inquire about status of application.  Kamya Watling P. Brietta Manso, [place] Management [phone]
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
## 2019-08-31 NOTE — ED Notes (Signed)
Dr Eulis Foster notified that pt has had falls with thinners, soft bp, and expressive and receptive aphasia.

## 2019-08-31 NOTE — ED Notes (Signed)
Spoke with husband, [name], to update him. Also, allowed him to speak to his wife.

## 2019-09-01 ENCOUNTER — Ambulatory Visit: Payer: Self-pay | Admitting: Emergency Medicine

## 2019-09-01 DIAGNOSIS — R5381 Other malaise: Secondary | ICD-10-CM | POA: Diagnosis not present

## 2019-09-01 LAB — TSH: TSH: 1.27 u[IU]/mL (ref 0.450–4.500)

## 2019-09-01 LAB — CBG MONITORING, ED
Glucose-Capillary: 180 mg/dL — ABNORMAL HIGH (ref 70–99)
Glucose-Capillary: 185 mg/dL — ABNORMAL HIGH (ref 70–99)
Glucose-Capillary: 224 mg/dL — ABNORMAL HIGH (ref 70–99)

## 2019-09-01 LAB — SPECIMEN STATUS REPORT

## 2019-09-01 MED ORDER — LISINOPRIL 20 MG PO TABS
20.0000 mg | ORAL_TABLET | Freq: Once | ORAL | Status: AC
  Administered 2019-09-01: 20 mg via ORAL
  Filled 2019-09-01: qty 1

## 2019-09-01 NOTE — ED Notes (Signed)
Breakfast ordered 

## 2019-09-01 NOTE — ED Notes (Signed)
[place] daughter

## 2019-09-01 NOTE — ED Notes (Signed)
Lunch tray ordered 

## 2019-09-01 NOTE — Telephone Encounter (Signed)
On 08/30/2019 at 1:23 pm, order for FreeStyle Elenor Legato was re faxed with Dx:E11.8.

## 2019-09-01 NOTE — ED Notes (Signed)
Husband sitting with pt.

## 2019-09-01 NOTE — ED Notes (Signed)
Meal tray ordered 

## 2019-09-01 NOTE — ED Notes (Signed)
Daughter updated, speaking with mother now

## 2019-09-01 NOTE — Progress Notes (Signed)
2nd shift CSW has submitted necessary documentation needed for manual PASRR review. FL2 also sent out to SNFs in the greater [place] area. Family has expressed that their top preference is at the [facility].   TOC team will continue to follow pt for any discharge related needs.   [place] Transitions of Care  Clinical Social Worker  Ph: [phone]

## 2019-09-01 NOTE — ED Notes (Signed)
Pt repositioned so that she can eat breakfast.

## 2019-09-02 ENCOUNTER — Telehealth: Payer: Self-pay | Admitting: *Deleted

## 2019-09-02 ENCOUNTER — Other Ambulatory Visit: Payer: Self-pay

## 2019-09-02 DIAGNOSIS — R5381 Other malaise: Secondary | ICD-10-CM | POA: Diagnosis not present

## 2019-09-02 LAB — CBG MONITORING, ED: Glucose-Capillary: 179 mg/dL — ABNORMAL HIGH (ref 70–99)

## 2019-09-02 NOTE — Discharge Planning (Signed)
Clinical Social Work is seeking post-discharge placement for this patient at the following level of care: SNF.    

## 2019-09-02 NOTE — Progress Notes (Signed)
CSW in contact with [name], patients daughter. Kim expressed that she had received the list of facilities that she had been accepted to. [name] went into detail explained that she was interested in [facility]. 2nd shift CSW in contact with [facility]; both were full and not accepting any referrals.   CSW informed [name] that [facility] had also extended an offer. [name] states that she would like to pursue that placement. CSW attempted to contact [facility] without success. CSW left VM requesting call back from admissions coordinator.   TOC team will continue to follow pt for discharge needs.  [place] Transitions of Care  Clinical Social Worker  Ph: [phone]

## 2019-09-02 NOTE — ED Notes (Signed)
Pt.'s dinner tray delivered.

## 2019-09-02 NOTE — Progress Notes (Addendum)
CSW spoke with patient's daughter, [name], about current bed offers. CSW explained that the SNF preference, [facility], declined due to no bed availability. Patient's daughter asked if SNF placement will be long term. CSW explained that due to patient's insurance she will not qualify for long-term placement. CSW explained that patient will need to have Medicaid for long-term placement to be covered. Patient's daughter stated she understood and was agreeable to STR for now. Patient's daughter to review current bed offers and get back to CSW with choice.   Patient's PASSR still under review.  [place]en Circle, LCSW Transitions of Care Department [facility] ED [phone]

## 2019-09-02 NOTE — Patient Outreach (Signed)
[place] [facility]) Care Management  09/02/2019  [name] 11/19/1941 [id number]   RN Health Coach was notified that the patient was admitted to the hospital on 08/31/19 for fall in the home.  RN Health Coach will notify the Hospital Liaison to follow.  Plan:  RN Health coach will close the program due to admission.  Will notify the primary care.  [name] RN, BSN, [place] Direct Dial:  [phone]  Fax: [phone]

## 2019-09-02 NOTE — Telephone Encounter (Signed)
Faxed form for assistance with Eliquis 5 mg, Attn:Jill Simcox, CPht. Confirmation page 8:51 am.

## 2019-09-02 NOTE — ED Notes (Signed)
Breakfast Tray Ordered. 

## 2019-09-03 ENCOUNTER — Other Ambulatory Visit: Payer: Self-pay

## 2019-09-03 DIAGNOSIS — R5381 Other malaise: Secondary | ICD-10-CM | POA: Diagnosis not present

## 2019-09-03 NOTE — Progress Notes (Signed)
2nd shift CSW attempted to contact  [name] at [facility] without success. CSW left VM requesting call back with information regarding a bed offer.

## 2019-09-03 NOTE — Care Management (Signed)
ED CM met with patient and spouse at bedside to discuss next level of care. Discussed patient bed offers, husband reports he spoke with his daughter who has been assisting with researching the facilities and she does not want patient to go to [facility]. [facility] and [facility] are 2 of their choices, and per ED CSW they were extended a bed offer.  Family is agreeable to accept the bed offers, [facility] first choice.  ED CM/ ED CSW [facility] team  will handoff to the am TOC to follow up assisting with transitioning patient to next level of care.  Nurse on purple updated, patient will also need a recent covid-19 test  done.

## 2019-09-03 NOTE — ED Notes (Signed)
Case manager at [facility]

## 2019-09-03 NOTE — Progress Notes (Signed)
Physical Therapy Treatment Patient Details Name: [name] MRN: [id number] DOB: [birth year] Today's Date: 09/03/2019    History of Present Illness Pt is a 78 y/o female presenting to the ED secondary to multiple falls. Workup pending. PMH includes DM, CAD s/p stent, HTN, bells palsy, CVA, and obesity.    PT Comments    Pt with slow progression towards goals. Practice standing X3, and pt requiring mod to max A with use of RW. Pt unable to take side steps this session as she had very flexed posture and poor balance. Current recommendations appropriate as pt a very high fall risk. Will continue to follow acutely to maximize functional mobility independence and safety.     Follow Up Recommendations  SNF;Supervision/Assistance - 24 hour     Equipment Recommendations  Other (comment)(TBD)    Recommendations for Other Services       Precautions / Restrictions Precautions Precautions: Fall Precaution Comments: Per chart, multiple falls at home.  Restrictions Weight Bearing Restrictions: No    Mobility  Bed Mobility Overal bed mobility: Needs Assistance Bed Mobility: Supine to Sit;Sit to Supine     Supine to sit: Max assist Sit to supine: Max assist   General bed mobility comments: Required 2 attempts to come to sitting. Multimodal cues to move LEs to EOB prior to sitting up. Pt unable to use RUE secondary to pain and LUE weak at baseline.   Transfers Overall transfer level: Needs assistance Equipment used: Rolling walker (2 wheeled) Transfers: Sit to/from Stand Sit to Stand: Mod assist;Max assist         General transfer comment: Mod to max A to stand using RW X3. Pt with very flexed posture and unable to come to full upright in standing. Unable to take steps towards [facility], but was able to perform lateral scoots with assist.   Ambulation/Gait                 Stairs             Wheelchair Mobility    Modified Rankin (Stroke Patients Only)        Balance Overall balance assessment: Needs assistance Sitting-balance support: No upper extremity supported;Feet supported Sitting balance-Leahy Scale: Fair     Standing balance support: Bilateral upper extremity supported;During functional activity Standing balance-Leahy Scale: Poor Standing balance comment: Reliant on BUE support                             Cognition Arousal/Alertness: Awake/alert Behavior During Therapy: WFL for tasks assessed/performed Overall Cognitive Status: No family/caregiver present to determine baseline cognitive functioning                                 General Comments: Continues to present with slowed processing and requires multimodal cues during mobility.       Exercises General Exercises - Lower Extremity Ankle Circles/Pumps: AROM;Both;20 reps;Supine(multimodal cues for sequencing)    General Comments        Pertinent Vitals/Pain Pain Assessment: Faces Faces Pain Scale: Hurts whole lot Pain Location: R shoulder Pain Descriptors / Indicators: Grimacing;Guarding Pain Intervention(s): Limited activity within patient's tolerance;Monitored during session;Repositioned    Home Living                      Prior Function            PT  Goals (current goals can now be found in the care plan section) Acute Rehab PT Goals Patient Stated Goal: none stated PT Goal Formulation: Patient unable to participate in goal setting Time For Goal Achievement: 09/14/19 Potential to Achieve Goals: Fair Progress towards PT goals: Progressing toward goals    Frequency    Min 2X/week      PT Plan Current plan remains appropriate    Co-evaluation              AM-PAC PT "6 Clicks" Mobility   Outcome Measure  Help needed turning from your back to your side while in a flat bed without using bedrails?: Total Help needed moving from lying on your back to sitting on the side of a flat bed without using bedrails?:  Total Help needed moving to and from a bed to a chair (including a wheelchair)?: Total Help needed standing up from a chair using your arms (e.g., wheelchair or bedside chair)?: Total Help needed to walk in hospital room?: Total Help needed climbing 3-5 steps with a railing? : Total 6 Click Score: 6    End of Session Equipment Utilized During Treatment: Gait belt Activity Tolerance: Patient limited by pain Patient left: in bed;with call bell/phone within reach Nurse Communication: Mobility status PT Visit Diagnosis: History of falling (Z91.81);Muscle weakness (generalized) (M62.81);Repeated falls (R29.6);Unsteadiness on feet (R26.81)     Time: 0160-1093 PT Time Calculation (min) (ACUTE ONLY): 18 min  Charges:  $Therapeutic Activity: 8-22 mins                     Gladys Damme, PT, DPT  Acute Rehabilitation Services  Pager: [phone] Office: [phone]    Lehman Prom 09/03/2019, 6:13 PM

## 2019-09-03 NOTE — ED Notes (Signed)
Breakfast ordered 

## 2019-09-03 NOTE — Progress Notes (Addendum)
11:16a CSW spoke with patient's husband about current bed offer and 3 night stay rules. Patient's husband is open to patient going to one of the SNFs that have given bed offers. CSW called [facility] who is looking into if they can take patient. CSW also called [facility] to get some clarification on patient getting waived for SNF. CSW also spoke with patient's daughter who is open to other options as well, but still has preference for [facility].   9:51a CSW followed up with [facility] about bed offer and spoke with  [name]. Per [name] will not be able to accept patient due to the 3 night qualifying inpatient stay. CSW contacted patient's daughter [name] to let her know the outcome. No answer, CSW left a VM for a call back.   Golden Circle, LCSW Transitions of Care Department [facility] ED [phone]

## 2019-09-04 DIAGNOSIS — R5381 Other malaise: Secondary | ICD-10-CM | POA: Diagnosis not present

## 2019-09-04 LAB — CBG MONITORING, ED
Glucose-Capillary: 223 mg/dL — ABNORMAL HIGH (ref 70–99)
Glucose-Capillary: 221 mg/dL — ABNORMAL HIGH (ref 70–99)

## 2019-09-04 LAB — SARS CORONAVIRUS 2 (TAT 6-24 HRS): SARS Coronavirus 2: NEGATIVE

## 2019-09-04 MED ORDER — INSULIN ASPART 100 UNIT/ML ~~LOC~~ SOLN
0.0000 [IU] | Freq: Three times a day (TID) | SUBCUTANEOUS | Status: DC
  Administered 2019-09-04: 18:00:00 5 [IU] via SUBCUTANEOUS
  Administered 2019-09-05: 2 [IU] via SUBCUTANEOUS
  Administered 2019-09-05: 5 [IU] via SUBCUTANEOUS
  Administered 2019-09-05 – 2019-09-06 (×2): 8 [IU] via SUBCUTANEOUS
  Administered 2019-09-06 – 2019-09-07 (×3): 5 [IU] via SUBCUTANEOUS
  Administered 2019-09-07: 08:00:00 3 [IU] via SUBCUTANEOUS
  Administered 2019-09-07: 15 [IU] via SUBCUTANEOUS

## 2019-09-04 MED ORDER — INSULIN ASPART 100 UNIT/ML ~~LOC~~ SOLN
0.0000 [IU] | Freq: Every day | SUBCUTANEOUS | Status: DC
  Administered 2019-09-04 – 2019-09-05 (×2): 2 [IU] via SUBCUTANEOUS

## 2019-09-04 NOTE — ED Notes (Signed)
Pt being fed dinner.  

## 2019-09-04 NOTE — ED Notes (Signed)
Pt noted to be sleepy. Covid-19 test performed - pt attempted to remove swab from nose but RN was able to continue w/test. Encouraged pt to eat breakfast - pt stated "I'm all right" then returned to sleeping.

## 2019-09-04 NOTE — Progress Notes (Addendum)
Patient accepted to [facility] on Monday. CSW spoke with patient's spouse. CSW noted [facility] was full and had no anticipated date for bed availability. Bed offer was accepted. Will informed weekday social worker for need for follow-up Monday.  Patient's daughter [name] called and left number if we need to follow up with her [phone]  Daphine Deutscher, LCSW, [place] Worker II Emergency Department, [place], [place] [phone]

## 2019-09-04 NOTE — ED Notes (Signed)
Regular Diet was ordered for Lunch. 

## 2019-09-04 NOTE — ED Notes (Signed)
Spouse has arrived - visiting w/pt. 

## 2019-09-04 NOTE — ED Notes (Signed)
Regular Diet was ordered for Dinner. 

## 2019-09-05 DIAGNOSIS — R5381 Other malaise: Secondary | ICD-10-CM | POA: Diagnosis not present

## 2019-09-05 LAB — CBG MONITORING, ED
Glucose-Capillary: 139 mg/dL — ABNORMAL HIGH (ref 70–99)
Glucose-Capillary: 238 mg/dL — ABNORMAL HIGH (ref 70–99)
Glucose-Capillary: 265 mg/dL — ABNORMAL HIGH (ref 70–99)
Glucose-Capillary: 213 mg/dL — ABNORMAL HIGH (ref 70–99)

## 2019-09-05 NOTE — ED Notes (Signed)
Pt's head of bed lowered as requested. Joey, SW, in w/pt.

## 2019-09-05 NOTE — ED Notes (Signed)
Pt being fed lunch.  

## 2019-09-05 NOTE — ED Notes (Signed)
Patient Was given a Snack and Drink. A Regular Diet was ordered for Lunch.

## 2019-09-05 NOTE — ED Notes (Signed)
Pt's spouse at bedside - states they will plan to take pt home tomorrow (10/12) if unable to locate SNF that he would prefer for pt to go to. States he feels capable of caring for pt as he has done since "July". Advised spouse and pt SW will be in contact w/him tomorrow.

## 2019-09-05 NOTE — ED Notes (Signed)
Patient was given a Patent attorney.

## 2019-09-05 NOTE — ED Notes (Signed)
Pt eating dinner - being assisted by spouse.

## 2019-09-05 NOTE — ED Notes (Signed)
Pt's spouse is visiting w/pt.

## 2019-09-05 NOTE — ED Notes (Signed)
Daughter called - advised pt is sleeping at this time. Daughter advised she will call back later when her father is visiting.

## 2019-09-06 ENCOUNTER — Ambulatory Visit: Payer: Self-pay | Admitting: Pharmacist

## 2019-09-06 ENCOUNTER — Other Ambulatory Visit: Payer: Self-pay | Admitting: Pharmacy Technician

## 2019-09-06 DIAGNOSIS — R5381 Other malaise: Secondary | ICD-10-CM | POA: Diagnosis not present

## 2019-09-06 LAB — CBG MONITORING, ED
Glucose-Capillary: 196 mg/dL — ABNORMAL HIGH (ref 70–99)
Glucose-Capillary: 306 mg/dL — ABNORMAL HIGH (ref 70–99)
Glucose-Capillary: 237 mg/dL — ABNORMAL HIGH (ref 70–99)
Glucose-Capillary: 192 mg/dL — ABNORMAL HIGH (ref 70–99)

## 2019-09-06 NOTE — Progress Notes (Signed)
Inpatient Diabetes Program Recommendations  AACE/ADA: New Consensus Statement on Inpatient Glycemic Control (2015)  Target Ranges:  Prepandial:   less than 140 mg/dL      Peak postprandial:   less than 180 mg/dL (1-2 hours)      Critically ill patients:  140 - 180 mg/dL   Lab Results  Component Value Date   GLUCAP 196 (H) 09/06/2019   HGBA1C 11.5 (A) 08/30/2019    Review of Glycemic Control Results for REGINA, GANCI (MRN [id number]) as of 09/06/2019 08:43  Ref. Range 09/05/2019 07:46 09/05/2019 12:32 09/05/2019 17:51 09/05/2019 21:17 09/06/2019 08:23  Glucose-Capillary Latest Ref Range: 70 - 99 mg/dL 139 (H) 238 (H) 265 (H) 213 (H) 196 (H)   Diabetes history: DM 2 Outpatient Diabetes medications:  Current orders for Inpatient glycemic control: Lantus 24 units qhs, Novolog 0-15 units tid + hs  Inpatient Diabetes Program Recommendations:    Glucose trends increase after meals. Consider ordering Novolog 5 units tid in addition to correction scale for meal coverage insulin if pt consumes at least 50% of meals.  Thanks,  Tama Headings RN, MSN, BC-ADM Inpatient Diabetes Coordinator Team Pager [phone] (8a-5p)

## 2019-09-06 NOTE — Patient Outreach (Signed)
[facility]) Care Management  09/06/2019  [name] 01/27/1941 [id number]    Care coordination call placed to [place] in regards to patient's Basaglar application.  Spoke to [place] who informed patient had been APPROVED 08/29/2019-11/25/2019. She informed an order was placed with [facility] on 08/29/2019.  Spoke to [place] at [employer] who informed the medication order is due to be delivered to the patient's home on 09/17/2019.  Will followup with paitent in 10-14 business days to confirm receipt of medication.  Chaddrick Brue P. Omarii Scalzo, [place] Management [phone]

## 2019-09-06 NOTE — ED Notes (Signed)
Assisted pt with eating lunch. 

## 2019-09-06 NOTE — ED Notes (Signed)
Pt's breakfast arrived 

## 2019-09-06 NOTE — ED Notes (Signed)
BORDER

## 2019-09-06 NOTE — ED Notes (Signed)
Assisted pt with eating breakfast  

## 2019-09-06 NOTE — ED Notes (Signed)
Pt's lunch arrived 

## 2019-09-06 NOTE — Discharge Planning (Signed)
EDCM and EDSW spoke with pt daughter regarding placement.  Pt daughter states family is considering home with home health to have more time to search for SNF placement. EDCM and EDSW informed daughter that [facility] SW would take over placement needs should that be their decision.  Daughter informed that decision has to be made today for safety of patient and progress toward becoming independent.

## 2019-09-06 NOTE — ED Triage Notes (Signed)
CBG 196 

## 2019-09-06 NOTE — Progress Notes (Addendum)
3:20pm: CSW received return call from [facility] stating he wanted to accept the bed offer at [facility]. CSW informed Ebony Hail at [employer] of information. Patient will discharge first thing tomorrow morning to [facility] in [facility].  3pm: CSW spoke with [name], unfortunately he will not make a decision at this time. [name] is continuing to cry uncontrollably. [name] requested CSW to call back later. CSW attempted to reach patient's daughter [name] but she did not answer.  2:30pm: CSW received two additional phone calls from the patient's husband [name]. Both conversations with [name] yielded no results as he is sobbing, CSW unable to console husband via phone.   2pm: CSW spoke with patient's daughter [name] to determine if a decision has been made, and unfortunately it has not. Kim to speak with [name] to make a decision.  12:30pm: CSW received additional call from patient's husband [name] who stated he refuses to send the patient to [place]. CSW reiterated to [name] that a decision needs to be made. [name] continuing to request that the patient be admitted to the hospital and CSW continues to explain why that will not occur. [name] became tearful and began to cry, CSW informed him that a return call would be placed after he collected himself, he was agreeable.  10am: CSW spoke with [place] at [facility] who states the patient cannot be accepted at the facility due to not having a three day inpatient stay. CSW spoke with Chantel at [facility] who states there are beds available today.  CSW called patient's husband [name] back to inform him of information. [name] is to call [name] and have a discussion and return call to [place].  8:30am: CSW spoke with patient's daughter [name] [phone]) and husband [name] [phone]) to inform them that a decision must be made today on which facility this patient will go to. [name] states her mother cannot go to [facility]. [name] requesting CSW contact [employer] about bed availability. There are no beds available at  [facility] today.  CSW attempted to meet with patient at bedside, however patient was just waking up and was not answering questions. Patient's breakfast arrived and CSW offered to return and patient agreed.  Madilyn Fireman, MSW, LCSW-A Transitions of Care  Clinical Social Worker  [facility] Emergency Departments  Medical ICU [phone]

## 2019-09-06 NOTE — ED Notes (Signed)
Breakfast ordered 

## 2019-09-07 DIAGNOSIS — G8194 Hemiplegia, unspecified affecting left nondominant side: Secondary | ICD-10-CM | POA: Diagnosis not present

## 2019-09-07 DIAGNOSIS — I679 Cerebrovascular disease, unspecified: Secondary | ICD-10-CM | POA: Diagnosis not present

## 2019-09-07 DIAGNOSIS — R0902 Hypoxemia: Secondary | ICD-10-CM | POA: Diagnosis not present

## 2019-09-07 DIAGNOSIS — R5381 Other malaise: Secondary | ICD-10-CM | POA: Diagnosis not present

## 2019-09-07 DIAGNOSIS — S52602A Unspecified fracture of lower end of left ulna, initial encounter for closed fracture: Secondary | ICD-10-CM | POA: Diagnosis present

## 2019-09-07 DIAGNOSIS — I9589 Other hypotension: Secondary | ICD-10-CM | POA: Diagnosis not present

## 2019-09-07 DIAGNOSIS — Z888 Allergy status to other drugs, medicaments and biological substances status: Secondary | ICD-10-CM | POA: Diagnosis not present

## 2019-09-07 DIAGNOSIS — I63312 Cerebral infarction due to thrombosis of left middle cerebral artery: Secondary | ICD-10-CM | POA: Diagnosis present

## 2019-09-07 DIAGNOSIS — Z794 Long term (current) use of insulin: Secondary | ICD-10-CM | POA: Diagnosis not present

## 2019-09-07 DIAGNOSIS — Z8673 Personal history of transient ischemic attack (TIA), and cerebral infarction without residual deficits: Secondary | ICD-10-CM | POA: Diagnosis not present

## 2019-09-07 DIAGNOSIS — E1151 Type 2 diabetes mellitus with diabetic peripheral angiopathy without gangrene: Secondary | ICD-10-CM | POA: Diagnosis present

## 2019-09-07 DIAGNOSIS — R279 Unspecified lack of coordination: Secondary | ICD-10-CM | POA: Diagnosis not present

## 2019-09-07 DIAGNOSIS — I480 Paroxysmal atrial fibrillation: Secondary | ICD-10-CM | POA: Diagnosis present

## 2019-09-07 DIAGNOSIS — R471 Dysarthria and anarthria: Secondary | ICD-10-CM | POA: Diagnosis present

## 2019-09-07 DIAGNOSIS — F29 Unspecified psychosis not due to a substance or known physiological condition: Secondary | ICD-10-CM | POA: Diagnosis not present

## 2019-09-07 DIAGNOSIS — R296 Repeated falls: Secondary | ICD-10-CM | POA: Diagnosis not present

## 2019-09-07 DIAGNOSIS — Z20828 Contact with and (suspected) exposure to other viral communicable diseases: Secondary | ICD-10-CM | POA: Diagnosis not present

## 2019-09-07 DIAGNOSIS — R2689 Other abnormalities of gait and mobility: Secondary | ICD-10-CM | POA: Diagnosis present

## 2019-09-07 DIAGNOSIS — R4701 Aphasia: Secondary | ICD-10-CM | POA: Diagnosis present

## 2019-09-07 DIAGNOSIS — R29713 NIHSS score 13: Secondary | ICD-10-CM | POA: Diagnosis present

## 2019-09-07 DIAGNOSIS — I6389 Other cerebral infarction: Secondary | ICD-10-CM | POA: Diagnosis not present

## 2019-09-07 DIAGNOSIS — E861 Hypovolemia: Secondary | ICD-10-CM | POA: Diagnosis not present

## 2019-09-07 DIAGNOSIS — R410 Disorientation, unspecified: Secondary | ICD-10-CM | POA: Diagnosis not present

## 2019-09-07 DIAGNOSIS — E1159 Type 2 diabetes mellitus with other circulatory complications: Secondary | ICD-10-CM | POA: Diagnosis not present

## 2019-09-07 DIAGNOSIS — I517 Cardiomegaly: Secondary | ICD-10-CM | POA: Diagnosis not present

## 2019-09-07 DIAGNOSIS — R531 Weakness: Secondary | ICD-10-CM | POA: Diagnosis not present

## 2019-09-07 DIAGNOSIS — Z743 Need for continuous supervision: Secondary | ICD-10-CM | POA: Diagnosis not present

## 2019-09-07 DIAGNOSIS — I63511 Cerebral infarction due to unspecified occlusion or stenosis of right middle cerebral artery: Secondary | ICD-10-CM | POA: Diagnosis present

## 2019-09-07 DIAGNOSIS — R482 Apraxia: Secondary | ICD-10-CM | POA: Diagnosis present

## 2019-09-07 DIAGNOSIS — J9811 Atelectasis: Secondary | ICD-10-CM | POA: Diagnosis not present

## 2019-09-07 DIAGNOSIS — G894 Chronic pain syndrome: Secondary | ICD-10-CM | POA: Diagnosis present

## 2019-09-07 DIAGNOSIS — I69354 Hemiplegia and hemiparesis following cerebral infarction affecting left non-dominant side: Secondary | ICD-10-CM | POA: Diagnosis not present

## 2019-09-07 DIAGNOSIS — N390 Urinary tract infection, site not specified: Secondary | ICD-10-CM | POA: Diagnosis present

## 2019-09-07 DIAGNOSIS — I5032 Chronic diastolic (congestive) heart failure: Secondary | ICD-10-CM | POA: Diagnosis present

## 2019-09-07 DIAGNOSIS — R414 Neurologic neglect syndrome: Secondary | ICD-10-CM | POA: Diagnosis present

## 2019-09-07 DIAGNOSIS — M6281 Muscle weakness (generalized): Secondary | ICD-10-CM | POA: Diagnosis not present

## 2019-09-07 DIAGNOSIS — Z741 Need for assistance with personal care: Secondary | ICD-10-CM | POA: Diagnosis present

## 2019-09-07 DIAGNOSIS — R52 Pain, unspecified: Secondary | ICD-10-CM | POA: Diagnosis not present

## 2019-09-07 DIAGNOSIS — I6330 Cerebral infarction due to thrombosis of unspecified cerebral artery: Secondary | ICD-10-CM | POA: Diagnosis not present

## 2019-09-07 DIAGNOSIS — S52502A Unspecified fracture of the lower end of left radius, initial encounter for closed fracture: Secondary | ICD-10-CM | POA: Diagnosis present

## 2019-09-07 DIAGNOSIS — I959 Hypotension, unspecified: Secondary | ICD-10-CM | POA: Diagnosis not present

## 2019-09-07 DIAGNOSIS — W19XXXA Unspecified fall, initial encounter: Secondary | ICD-10-CM | POA: Diagnosis present

## 2019-09-07 DIAGNOSIS — Z7901 Long term (current) use of anticoagulants: Secondary | ICD-10-CM | POA: Diagnosis not present

## 2019-09-07 DIAGNOSIS — I2510 Atherosclerotic heart disease of native coronary artery without angina pectoris: Secondary | ICD-10-CM | POA: Diagnosis present

## 2019-09-07 DIAGNOSIS — I639 Cerebral infarction, unspecified: Secondary | ICD-10-CM | POA: Diagnosis not present

## 2019-09-07 DIAGNOSIS — I672 Cerebral atherosclerosis: Secondary | ICD-10-CM | POA: Diagnosis present

## 2019-09-07 DIAGNOSIS — A419 Sepsis, unspecified organism: Secondary | ICD-10-CM | POA: Diagnosis present

## 2019-09-07 DIAGNOSIS — I110 Hypertensive heart disease with heart failure: Secondary | ICD-10-CM | POA: Diagnosis present

## 2019-09-07 DIAGNOSIS — I482 Chronic atrial fibrillation, unspecified: Secondary | ICD-10-CM | POA: Diagnosis present

## 2019-09-07 DIAGNOSIS — I4891 Unspecified atrial fibrillation: Secondary | ICD-10-CM | POA: Diagnosis present

## 2019-09-07 DIAGNOSIS — I69320 Aphasia following cerebral infarction: Secondary | ICD-10-CM | POA: Diagnosis not present

## 2019-09-07 DIAGNOSIS — Z881 Allergy status to other antibiotic agents status: Secondary | ICD-10-CM | POA: Diagnosis not present

## 2019-09-07 DIAGNOSIS — N179 Acute kidney failure, unspecified: Secondary | ICD-10-CM | POA: Diagnosis present

## 2019-09-07 DIAGNOSIS — E1169 Type 2 diabetes mellitus with other specified complication: Secondary | ICD-10-CM | POA: Diagnosis not present

## 2019-09-07 DIAGNOSIS — F419 Anxiety disorder, unspecified: Secondary | ICD-10-CM | POA: Diagnosis not present

## 2019-09-07 DIAGNOSIS — I10 Essential (primary) hypertension: Secondary | ICD-10-CM | POA: Diagnosis not present

## 2019-09-07 DIAGNOSIS — E039 Hypothyroidism, unspecified: Secondary | ICD-10-CM | POA: Diagnosis present

## 2019-09-07 DIAGNOSIS — Z955 Presence of coronary angioplasty implant and graft: Secondary | ICD-10-CM | POA: Diagnosis not present

## 2019-09-07 LAB — CBG MONITORING, ED
Glucose-Capillary: 157 mg/dL — ABNORMAL HIGH (ref 70–99)
Glucose-Capillary: 239 mg/dL — ABNORMAL HIGH (ref 70–99)
Glucose-Capillary: 361 mg/dL — ABNORMAL HIGH (ref 70–99)

## 2019-09-07 MED ORDER — HYDROCODONE-ACETAMINOPHEN 10-325 MG PO TABS: 1.0000 | ORAL_TABLET | Freq: Four times a day (QID) | ORAL | 0 refills | Status: DC | PRN

## 2019-09-07 NOTE — ED Notes (Signed)
Spoke with Network engineer to confirm PTAR was called for patient being transported to [facility].

## 2019-09-07 NOTE — Progress Notes (Addendum)
11:15am: Patient will go to room 114 at [facility]. The number to call for report is [phone]. CSW will arrange for transportation via PTAR.  9am: CSW facilitated phone call with Pamala Hurry from [facility] Must to complete the level II PASSR screening. Pamala Hurry states the [employer] number should be issued this morning.   CSW spoke with Ebony Hail at [facility] who states the patient can come to the facility as soon as PASSR is issued. CSW spoke with patient's daughter [name] to inform of her discharge plan, [name] is agreeable.  Madilyn Fireman, MSW, LCSW-A Transitions of Care  Clinical Social Worker  [facility] Emergency Departments  Medical ICU [phone]

## 2019-09-07 NOTE — ED Notes (Signed)
Ordered bfast 

## 2019-09-07 NOTE — ED Notes (Signed)
PTAR called @ 1613-per Marya Amsler, RN called by Levada Dy

## 2019-09-07 NOTE — ED Notes (Signed)
PTAR arrived to transport patient. 

## 2019-09-07 NOTE — ED Notes (Signed)
Social worker called stated patient accepted at [facility] [phone]. Bed 115.

## 2019-09-07 NOTE — Discharge Instructions (Signed)
Please read attached information. If you experience any new or worsening signs or symptoms please return to the emergency room for evaluation.  °
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
## 2019-09-07 NOTE — ED Notes (Signed)
Lunch trey ordered

## 2019-09-07 NOTE — ED Notes (Signed)
Report given to [place] spoke with Darius RN.

## 2019-09-07 NOTE — ED Provider Notes (Signed)
78 year old female here for placement secondary to deconditioning and falls.  Please see previous providers note for full H&P.  Patient evaluated at bedside with her husband present, she appears to be in no acute distress.  She stable for discharge to care facility at this time.  Today's Vitals   09/07/19 0523 09/07/19 0708 09/07/19 0723 09/07/19 1015  BP:  121/86    Pulse:  86    Resp:  14    Temp:  97.9 F (36.6 C)    TempSrc:  Oral    SpO2:  95%    Height:      PainSc: Asleep  10-Worst pain ever 7    Body mass index is 30.11 kg/m.   Okey Regal, PA-C 09/07/19 1055    Lajean Saver, MD 09/07/19 [phone]

## 2019-09-07 NOTE — ED Notes (Signed)
Hit reject by mistake, CBG 239. Nurse notified

## 2019-09-07 NOTE — Progress Notes (Addendum)
[facility] ED CSW received a call from [place] in admissions at [facility] stating they are ready for report and that the pt needs to transport as soon as possible in orderfor the pt to be able to go there today.  Per Tammy report needs to go to [phone] and pt will be going to room 115 at [facility].  4:04 PM RN updated and calling report now.  Per RN, Corey Harold has been called.  Tammy at [facility] has been updated.  CSW will continue to follow for D/C needs.  Alphonse Guild. Jerian Morais, LCSW, LCAS, CSI Transitions of Care Clinical Social Worker Care Coordination Department Ph: [phone]

## 2019-09-08 ENCOUNTER — Other Ambulatory Visit: Payer: Self-pay | Admitting: Pharmacy Technician

## 2019-09-08 NOTE — Patient Outreach (Signed)
[facility]) Care Management  09/08/2019  [name] Sep 19, 1941 [id number]  Care coordination call placed to [employer] in regards to patient's Eliquis application.  Spoke to [place]. Belinda provided me the case number which is [id number]. She informed the patient has been DENIED as they need to spend for the household $807.00 out of pocket to qualify for the program. She informed the OOP report that was submitted was from 2019 and they need it for the current year.  Received the following message from [place]: Good morning,   This patient went to [facility] after several days in the ED, she is not followed by [facility] nurse at that facility, you can close her case at this time. Thanks, V   Natividad Brood, RN BSN [facility] Liaison  Based on the inform from [place] will route note to [place] that case is being closed and will remove myself from care team.  Luiz Ochoa. Fronnie Urton, [place] Management [phone]

## 2019-09-09 ENCOUNTER — Telehealth: Payer: Self-pay | Admitting: Pharmacist

## 2019-09-09 DIAGNOSIS — I10 Essential (primary) hypertension: Secondary | ICD-10-CM | POA: Diagnosis not present

## 2019-09-09 DIAGNOSIS — G8194 Hemiplegia, unspecified affecting left nondominant side: Secondary | ICD-10-CM | POA: Diagnosis not present

## 2019-09-09 DIAGNOSIS — I679 Cerebrovascular disease, unspecified: Secondary | ICD-10-CM | POA: Diagnosis not present

## 2019-09-09 DIAGNOSIS — I2510 Atherosclerotic heart disease of native coronary artery without angina pectoris: Secondary | ICD-10-CM | POA: Diagnosis not present

## 2019-09-09 NOTE — Telephone Encounter (Signed)
-----   Message from Jason Fila, CPhT sent at 09/08/2019 12:53 PM EDT ----- Juluis Rainier

## 2019-09-09 NOTE — Patient Outreach (Signed)
[facility]) Care Management  09/09/2019  AVALEY COOP [birth year] [id number]   Patient's case is being closed as she is now in a nursing home. She has been denied for patient assistance with Eliquis and approved for patient assistance with Basaglar.  Patient's medications will now be dispensed and billed through the nursing facility.  Plan: Close patient's case.  Elayne Guerin, PharmD, [place] Clinical Pharmacist [phone]

## 2019-09-10 ENCOUNTER — Telehealth: Payer: Self-pay | Admitting: Emergency Medicine

## 2019-09-10 NOTE — Telephone Encounter (Signed)
[place]. SHE WAS IN ER 10.06.2020 . DUE TO FALL. SHE HAS BEEN PLACED IN A FACILITY . hE IS NOT SURE WHERE .  fr

## 2019-09-10 NOTE — Telephone Encounter (Signed)
Pts daughter is wanting to speak with Dr,Sagardia . Pt is unable to move her LFT arm and needs someone to talk to her about her diagnosis  Please each out to pt daughter is Dr. Janeece Fitting   at [phone]

## 2019-09-13 ENCOUNTER — Ambulatory Visit: Payer: Self-pay | Admitting: Pharmacist

## 2019-09-13 ENCOUNTER — Telehealth: Payer: Self-pay | Admitting: Emergency Medicine

## 2019-09-13 DIAGNOSIS — G8194 Hemiplegia, unspecified affecting left nondominant side: Secondary | ICD-10-CM | POA: Diagnosis not present

## 2019-09-13 DIAGNOSIS — Z8673 Personal history of transient ischemic attack (TIA), and cerebral infarction without residual deficits: Secondary | ICD-10-CM | POA: Diagnosis not present

## 2019-09-13 DIAGNOSIS — I10 Essential (primary) hypertension: Secondary | ICD-10-CM | POA: Diagnosis not present

## 2019-09-13 DIAGNOSIS — I679 Cerebrovascular disease, unspecified: Secondary | ICD-10-CM | POA: Diagnosis not present

## 2019-09-13 NOTE — Telephone Encounter (Signed)
Daughter kim calling to speak with Dr Mitchel Honour and discuss pt's condition, and having her moved closer to her in [place]. Please call before 1:30 today, late evening, or anytime tomorrow.  She needs to discuss options and/or concerns.   [phone]  New DPR signed at last visit.

## 2019-09-14 ENCOUNTER — Ambulatory Visit: Payer: Self-pay | Admitting: Pharmacist

## 2019-09-15 NOTE — Telephone Encounter (Signed)
[name] is calling checking on status of this message. She says if her mom needs an appointment for this that's fine.

## 2019-09-16 ENCOUNTER — Other Ambulatory Visit: Payer: Self-pay

## 2019-09-16 ENCOUNTER — Inpatient Hospital Stay (HOSPITAL_COMMUNITY)
Admission: EM | Admit: 2019-09-16 | Discharge: 2019-09-28 | DRG: 871 | Disposition: A | Discharge status: Skilled Nursing Facility | Payer: Medicare Other | Source: Skilled Nursing Facility | Attending: Internal Medicine | Admitting: Internal Medicine

## 2019-09-16 ENCOUNTER — Emergency Department (HOSPITAL_COMMUNITY): Payer: Medicare Other

## 2019-09-16 ENCOUNTER — Encounter (HOSPITAL_COMMUNITY): Payer: Self-pay

## 2019-09-16 DIAGNOSIS — Z881 Allergy status to other antibiotic agents status: Secondary | ICD-10-CM

## 2019-09-16 DIAGNOSIS — R531 Weakness: Secondary | ICD-10-CM

## 2019-09-16 DIAGNOSIS — I2510 Atherosclerotic heart disease of native coronary artery without angina pectoris: Secondary | ICD-10-CM | POA: Diagnosis present

## 2019-09-16 DIAGNOSIS — M25512 Pain in left shoulder: Secondary | ICD-10-CM | POA: Diagnosis present

## 2019-09-16 DIAGNOSIS — I63511 Cerebral infarction due to unspecified occlusion or stenosis of right middle cerebral artery: Secondary | ICD-10-CM | POA: Diagnosis not present

## 2019-09-16 DIAGNOSIS — Z20828 Contact with and (suspected) exposure to other viral communicable diseases: Secondary | ICD-10-CM | POA: Diagnosis not present

## 2019-09-16 DIAGNOSIS — Z833 Family history of diabetes mellitus: Secondary | ICD-10-CM

## 2019-09-16 DIAGNOSIS — F419 Anxiety disorder, unspecified: Secondary | ICD-10-CM | POA: Diagnosis present

## 2019-09-16 DIAGNOSIS — M6281 Muscle weakness (generalized): Secondary | ICD-10-CM | POA: Diagnosis not present

## 2019-09-16 DIAGNOSIS — S52502A Unspecified fracture of the lower end of left radius, initial encounter for closed fracture: Secondary | ICD-10-CM | POA: Diagnosis present

## 2019-09-16 DIAGNOSIS — Z905 Acquired absence of kidney: Secondary | ICD-10-CM

## 2019-09-16 DIAGNOSIS — M549 Dorsalgia, unspecified: Secondary | ICD-10-CM | POA: Diagnosis present

## 2019-09-16 DIAGNOSIS — N179 Acute kidney failure, unspecified: Secondary | ICD-10-CM | POA: Diagnosis not present

## 2019-09-16 DIAGNOSIS — Z751 Person awaiting admission to adequate facility elsewhere: Secondary | ICD-10-CM

## 2019-09-16 DIAGNOSIS — I6523 Occlusion and stenosis of bilateral carotid arteries: Secondary | ICD-10-CM | POA: Diagnosis present

## 2019-09-16 DIAGNOSIS — E039 Hypothyroidism, unspecified: Secondary | ICD-10-CM | POA: Diagnosis present

## 2019-09-16 DIAGNOSIS — R29713 NIHSS score 13: Secondary | ICD-10-CM | POA: Diagnosis present

## 2019-09-16 DIAGNOSIS — I672 Cerebral atherosclerosis: Secondary | ICD-10-CM | POA: Diagnosis present

## 2019-09-16 DIAGNOSIS — I6330 Cerebral infarction due to thrombosis of unspecified cerebral artery: Secondary | ICD-10-CM | POA: Insufficient documentation

## 2019-09-16 DIAGNOSIS — Z6830 Body mass index (BMI) 30.0-30.9, adult: Secondary | ICD-10-CM

## 2019-09-16 DIAGNOSIS — I5032 Chronic diastolic (congestive) heart failure: Secondary | ICD-10-CM

## 2019-09-16 DIAGNOSIS — M19012 Primary osteoarthritis, left shoulder: Secondary | ICD-10-CM | POA: Diagnosis not present

## 2019-09-16 DIAGNOSIS — E119 Type 2 diabetes mellitus without complications: Secondary | ICD-10-CM

## 2019-09-16 DIAGNOSIS — Z79891 Long term (current) use of opiate analgesic: Secondary | ICD-10-CM

## 2019-09-16 DIAGNOSIS — R414 Neurologic neglect syndrome: Secondary | ICD-10-CM | POA: Diagnosis present

## 2019-09-16 DIAGNOSIS — E785 Hyperlipidemia, unspecified: Secondary | ICD-10-CM | POA: Diagnosis present

## 2019-09-16 DIAGNOSIS — G9389 Other specified disorders of brain: Secondary | ICD-10-CM | POA: Diagnosis present

## 2019-09-16 DIAGNOSIS — Z791 Long term (current) use of non-steroidal anti-inflammatories (NSAID): Secondary | ICD-10-CM

## 2019-09-16 DIAGNOSIS — E1159 Type 2 diabetes mellitus with other circulatory complications: Secondary | ICD-10-CM

## 2019-09-16 DIAGNOSIS — R296 Repeated falls: Secondary | ICD-10-CM | POA: Diagnosis present

## 2019-09-16 DIAGNOSIS — N390 Urinary tract infection, site not specified: Secondary | ICD-10-CM | POA: Diagnosis not present

## 2019-09-16 DIAGNOSIS — I69354 Hemiplegia and hemiparesis following cerebral infarction affecting left non-dominant side: Secondary | ICD-10-CM

## 2019-09-16 DIAGNOSIS — S52602A Unspecified fracture of lower end of left ulna, initial encounter for closed fracture: Secondary | ICD-10-CM | POA: Diagnosis present

## 2019-09-16 DIAGNOSIS — Z7901 Long term (current) use of anticoagulants: Secondary | ICD-10-CM

## 2019-09-16 DIAGNOSIS — Z7401 Bed confinement status: Secondary | ICD-10-CM

## 2019-09-16 DIAGNOSIS — Z955 Presence of coronary angioplasty implant and graft: Secondary | ICD-10-CM

## 2019-09-16 DIAGNOSIS — I69320 Aphasia following cerebral infarction: Secondary | ICD-10-CM

## 2019-09-16 DIAGNOSIS — A419 Sepsis, unspecified organism: Secondary | ICD-10-CM | POA: Diagnosis not present

## 2019-09-16 DIAGNOSIS — I517 Cardiomegaly: Secondary | ICD-10-CM | POA: Diagnosis not present

## 2019-09-16 DIAGNOSIS — W19XXXA Unspecified fall, initial encounter: Secondary | ICD-10-CM | POA: Diagnosis present

## 2019-09-16 DIAGNOSIS — Z79899 Other long term (current) drug therapy: Secondary | ICD-10-CM

## 2019-09-16 DIAGNOSIS — E669 Obesity, unspecified: Secondary | ICD-10-CM | POA: Diagnosis present

## 2019-09-16 DIAGNOSIS — R52 Pain, unspecified: Secondary | ICD-10-CM | POA: Diagnosis not present

## 2019-09-16 DIAGNOSIS — I679 Cerebrovascular disease, unspecified: Secondary | ICD-10-CM | POA: Diagnosis not present

## 2019-09-16 DIAGNOSIS — J9811 Atelectasis: Secondary | ICD-10-CM | POA: Diagnosis not present

## 2019-09-16 DIAGNOSIS — E861 Hypovolemia: Secondary | ICD-10-CM | POA: Diagnosis present

## 2019-09-16 DIAGNOSIS — Z794 Long term (current) use of insulin: Secondary | ICD-10-CM

## 2019-09-16 DIAGNOSIS — R4182 Altered mental status, unspecified: Secondary | ICD-10-CM | POA: Diagnosis not present

## 2019-09-16 DIAGNOSIS — I110 Hypertensive heart disease with heart failure: Secondary | ICD-10-CM | POA: Diagnosis present

## 2019-09-16 DIAGNOSIS — Z79890 Hormone replacement therapy (postmenopausal): Secondary | ICD-10-CM

## 2019-09-16 DIAGNOSIS — Z8249 Family history of ischemic heart disease and other diseases of the circulatory system: Secondary | ICD-10-CM

## 2019-09-16 DIAGNOSIS — E1151 Type 2 diabetes mellitus with diabetic peripheral angiopathy without gangrene: Secondary | ICD-10-CM | POA: Diagnosis present

## 2019-09-16 DIAGNOSIS — I480 Paroxysmal atrial fibrillation: Secondary | ICD-10-CM | POA: Diagnosis present

## 2019-09-16 DIAGNOSIS — I959 Hypotension, unspecified: Secondary | ICD-10-CM | POA: Diagnosis present

## 2019-09-16 DIAGNOSIS — I482 Chronic atrial fibrillation, unspecified: Secondary | ICD-10-CM

## 2019-09-16 DIAGNOSIS — Z888 Allergy status to other drugs, medicaments and biological substances status: Secondary | ICD-10-CM

## 2019-09-16 DIAGNOSIS — G894 Chronic pain syndrome: Secondary | ICD-10-CM | POA: Diagnosis present

## 2019-09-16 LAB — CBC
HCT: 41.6 % (ref 36.0–46.0)
Hemoglobin: 13.3 g/dL (ref 12.0–15.0)
MCH: 26.7 pg (ref 26.0–34.0)
MCHC: 32 g/dL (ref 30.0–36.0)
MCV: 83.5 fL (ref 80.0–100.0)
Platelets: 366 10*3/uL (ref 150–400)
RBC: 4.98 MIL/uL (ref 3.87–5.11)
RDW: 14.3 % (ref 11.5–15.5)
WBC: 9.8 10*3/uL (ref 4.0–10.5)
nRBC: 0 % (ref 0.0–0.2)

## 2019-09-16 LAB — APTT: aPTT: 32 seconds (ref 24–36)

## 2019-09-16 LAB — CBG MONITORING, ED: Glucose-Capillary: 187 mg/dL — ABNORMAL HIGH (ref 70–99)

## 2019-09-16 LAB — COMPREHENSIVE METABOLIC PANEL
ALT: 42 U/L (ref 0–44)
AST: 41 U/L (ref 15–41)
Albumin: 3.5 g/dL (ref 3.5–5.0)
Alkaline Phosphatase: 109 U/L (ref 38–126)
Anion gap: 14 (ref 5–15)
BUN: 40 mg/dL — ABNORMAL HIGH (ref 8–23)
CO2: 21 mmol/L — ABNORMAL LOW (ref 22–32)
Calcium: 9.9 mg/dL (ref 8.9–10.3)
Chloride: 99 mmol/L (ref 98–111)
Creatinine, Ser: 1.48 mg/dL — ABNORMAL HIGH (ref 0.44–1.00)
GFR calc Af Amer: 39 mL/min — ABNORMAL LOW (ref >60)
GFR calc non Af Amer: 33 mL/min — ABNORMAL LOW (ref >60)
Glucose, Bld: 192 mg/dL — ABNORMAL HIGH (ref 70–99)
Potassium: 4.1 mmol/L (ref 3.5–5.1)
Sodium: 134 mmol/L — ABNORMAL LOW (ref 135–145)
Total Bilirubin: 0.6 mg/dL (ref 0.3–1.2)
Total Protein: 7 g/dL (ref 6.5–8.1)

## 2019-09-16 LAB — LACTIC ACID, PLASMA
Lactic Acid, Venous: 2.2 mmol/L (ref 0.5–1.9)
Lactic Acid, Venous: 2 mmol/L (ref 0.5–1.9)

## 2019-09-16 LAB — PROTIME-INR
INR: 1.3 — ABNORMAL HIGH (ref 0.8–1.2)
Prothrombin Time: 16.4 seconds — ABNORMAL HIGH (ref 11.4–15.2)

## 2019-09-16 MED ORDER — SODIUM CHLORIDE 0.9 % IV SOLN: 2.0000 g | INTRAVENOUS | Status: DC

## 2019-09-16 MED ORDER — METRONIDAZOLE IN NACL 5-0.79 MG/ML-% IV SOLN
500.0000 mg | Freq: Once | INTRAVENOUS | Status: AC
  Administered 2019-09-16: 500 mg via INTRAVENOUS
  Filled 2019-09-16: qty 100

## 2019-09-16 MED ORDER — SODIUM CHLORIDE 0.9 % IV BOLUS (SEPSIS)
1000.0000 mL | Freq: Once | INTRAVENOUS | Status: AC
  Administered 2019-09-17: 1000 mL via INTRAVENOUS

## 2019-09-16 MED ORDER — VANCOMYCIN HCL 10 G IV SOLR
1500.0000 mg | Freq: Once | INTRAVENOUS | Status: AC
  Administered 2019-09-17: 1500 mg via INTRAVENOUS
  Filled 2019-09-16: qty 1500

## 2019-09-16 MED ORDER — SODIUM CHLORIDE 0.9 % IV SOLN
2.0000 g | Freq: Once | INTRAVENOUS | Status: AC
  Administered 2019-09-16: 2 g via INTRAVENOUS
  Filled 2019-09-16: qty 2

## 2019-09-16 MED ORDER — SODIUM CHLORIDE 0.9 % IV BOLUS (SEPSIS)
500.0000 mL | Freq: Once | INTRAVENOUS | Status: AC
  Administered 2019-09-16: 500 mL via INTRAVENOUS

## 2019-09-16 MED ORDER — SODIUM CHLORIDE 0.9 % IV BOLUS
1000.0000 mL | Freq: Once | INTRAVENOUS | Status: AC
  Administered 2019-09-16: 1000 mL via INTRAVENOUS

## 2019-09-16 MED ORDER — VANCOMYCIN HCL IN DEXTROSE 1-5 GM/200ML-% IV SOLN
1000.0000 mg | INTRAVENOUS | Status: DC
  Administered 2019-09-17: 1000 mg via INTRAVENOUS
  Filled 2019-09-16 (×2): qty 200

## 2019-09-16 MED ORDER — VANCOMYCIN HCL IN DEXTROSE 1-5 GM/200ML-% IV SOLN: 1000.0000 mg | Freq: Once | INTRAVENOUS | Status: DC

## 2019-09-16 MED ORDER — SODIUM CHLORIDE 0.9% FLUSH
3.0000 mL | Freq: Once | INTRAVENOUS | Status: AC
  Administered 2019-09-16: 3 mL via INTRAVENOUS

## 2019-09-16 MED ORDER — SODIUM CHLORIDE 0.9 % IV BOLUS (SEPSIS)
1000.0000 mL | Freq: Once | INTRAVENOUS | Status: AC
  Administered 2019-09-16: 1000 mL via INTRAVENOUS

## 2019-09-16 MED ORDER — VANCOMYCIN HCL 500 MG IV SOLR
500.0000 mg | Freq: Once | INTRAVENOUS | Status: DC
  Filled 2019-09-16: qty 500

## 2019-09-16 NOTE — Progress Notes (Addendum)
Pharmacy Antibiotic Note  [name] is a 78 y.o. female admitted on 09/16/2019 with altered mental status.  Pharmacy has been consulted for vancomycin and cefepime dosing.  Afebrile, wbc 9.8, scr up to 1.48.   Vancomycin 1g mg IV Q 24 hrs. Goal AUC 400-550. Expected AUC: 508 SCr used: 1.48   Plan: Cefepime 2g q 24 hours Vancomycin 1500mg  x1 then 1g IV q24 hours     Temp (24hrs), Avg:98.5 F (36.9 C), Min:98.5 F (36.9 C), Max:98.5 F (36.9 C)  Recent Labs  Lab 09/16/19 2110  WBC 9.8  CREATININE 1.48*    Estimated Creatinine Clearance: 30.3 mL/min (A) (by C-G formula based on SCr of 1.48 mg/dL (H)).    Allergies  Allergen Reactions  . Azithromycin Nausea And Vomiting  . Cardizem [Diltiazem Hcl] Nausea And Vomiting  . Codeine Other (See Comments)    Made the patient "feel weird"   . Coreg [Carvedilol] Nausea And Vomiting  . Demerol [Meperidine] Other (See Comments)    Disorientation   . Erythromycin Nausea And Vomiting and Other (See Comments)    Made the patient "very sick"  . Inderal [Propranolol] Other (See Comments)    "Made me cry"  . Procardia [Nifedipine] Other (See Comments)    "Disoriented and sick"  . Wellbutrin [Bupropion] Nausea Only  . Zoloft [Sertraline Hcl] Other (See Comments)    Could not talk, made the patient stiff   Thank you for allowing pharmacy to be a part of this patient's care.  Erin Hearing PharmD., BCPS Clinical Pharmacist 09/16/2019 10:38 PM

## 2019-09-16 NOTE — ED Triage Notes (Signed)
Per facility pt is at baseline. Family feels pt is not progressing as she should after stroke

## 2019-09-16 NOTE — Telephone Encounter (Signed)
Spoke to patient's daughter and she wanted to speak to Dr Mitchel Honour concerning mother's medical problem to get a better understanding.
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
## 2019-09-17 ENCOUNTER — Other Ambulatory Visit: Payer: Self-pay

## 2019-09-17 ENCOUNTER — Inpatient Hospital Stay (HOSPITAL_COMMUNITY): Payer: Medicare Other

## 2019-09-17 ENCOUNTER — Emergency Department (HOSPITAL_COMMUNITY): Payer: Medicare Other

## 2019-09-17 DIAGNOSIS — I5032 Chronic diastolic (congestive) heart failure: Secondary | ICD-10-CM | POA: Diagnosis present

## 2019-09-17 DIAGNOSIS — G894 Chronic pain syndrome: Secondary | ICD-10-CM | POA: Diagnosis present

## 2019-09-17 DIAGNOSIS — I63239 Cerebral infarction due to unspecified occlusion or stenosis of unspecified carotid arteries: Secondary | ICD-10-CM | POA: Diagnosis not present

## 2019-09-17 DIAGNOSIS — A419 Sepsis, unspecified organism: Principal | ICD-10-CM

## 2019-09-17 DIAGNOSIS — S52602D Unspecified fracture of lower end of left ulna, subsequent encounter for closed fracture with routine healing: Secondary | ICD-10-CM | POA: Diagnosis not present

## 2019-09-17 DIAGNOSIS — I110 Hypertensive heart disease with heart failure: Secondary | ICD-10-CM | POA: Diagnosis present

## 2019-09-17 DIAGNOSIS — G8929 Other chronic pain: Secondary | ICD-10-CM | POA: Diagnosis not present

## 2019-09-17 DIAGNOSIS — I639 Cerebral infarction, unspecified: Secondary | ICD-10-CM

## 2019-09-17 DIAGNOSIS — E1169 Type 2 diabetes mellitus with other specified complication: Secondary | ICD-10-CM | POA: Diagnosis not present

## 2019-09-17 DIAGNOSIS — I63511 Cerebral infarction due to unspecified occlusion or stenosis of right middle cerebral artery: Secondary | ICD-10-CM | POA: Diagnosis present

## 2019-09-17 DIAGNOSIS — I6330 Cerebral infarction due to thrombosis of unspecified cerebral artery: Secondary | ICD-10-CM | POA: Diagnosis not present

## 2019-09-17 DIAGNOSIS — I6389 Other cerebral infarction: Secondary | ICD-10-CM

## 2019-09-17 DIAGNOSIS — R41841 Cognitive communication deficit: Secondary | ICD-10-CM | POA: Diagnosis not present

## 2019-09-17 DIAGNOSIS — I469 Cardiac arrest, cause unspecified: Secondary | ICD-10-CM | POA: Diagnosis not present

## 2019-09-17 DIAGNOSIS — I69320 Aphasia following cerebral infarction: Secondary | ICD-10-CM | POA: Diagnosis not present

## 2019-09-17 DIAGNOSIS — S52502A Unspecified fracture of the lower end of left radius, initial encounter for closed fracture: Secondary | ICD-10-CM | POA: Diagnosis present

## 2019-09-17 DIAGNOSIS — I63233 Cerebral infarction due to unspecified occlusion or stenosis of bilateral carotid arteries: Secondary | ICD-10-CM | POA: Diagnosis not present

## 2019-09-17 DIAGNOSIS — I480 Paroxysmal atrial fibrillation: Secondary | ICD-10-CM | POA: Diagnosis present

## 2019-09-17 DIAGNOSIS — R531 Weakness: Secondary | ICD-10-CM | POA: Diagnosis not present

## 2019-09-17 DIAGNOSIS — Z7901 Long term (current) use of anticoagulants: Secondary | ICD-10-CM | POA: Diagnosis not present

## 2019-09-17 DIAGNOSIS — M542 Cervicalgia: Secondary | ICD-10-CM | POA: Diagnosis not present

## 2019-09-17 DIAGNOSIS — E039 Hypothyroidism, unspecified: Secondary | ICD-10-CM | POA: Diagnosis present

## 2019-09-17 DIAGNOSIS — I482 Chronic atrial fibrillation, unspecified: Secondary | ICD-10-CM

## 2019-09-17 DIAGNOSIS — R2689 Other abnormalities of gait and mobility: Secondary | ICD-10-CM | POA: Diagnosis not present

## 2019-09-17 DIAGNOSIS — Z794 Long term (current) use of insulin: Secondary | ICD-10-CM

## 2019-09-17 DIAGNOSIS — Z955 Presence of coronary angioplasty implant and graft: Secondary | ICD-10-CM | POA: Diagnosis not present

## 2019-09-17 DIAGNOSIS — G8194 Hemiplegia, unspecified affecting left nondominant side: Secondary | ICD-10-CM | POA: Diagnosis not present

## 2019-09-17 DIAGNOSIS — R414 Neurologic neglect syndrome: Secondary | ICD-10-CM | POA: Diagnosis present

## 2019-09-17 DIAGNOSIS — R4701 Aphasia: Secondary | ICD-10-CM | POA: Diagnosis not present

## 2019-09-17 DIAGNOSIS — I959 Hypotension, unspecified: Secondary | ICD-10-CM | POA: Diagnosis not present

## 2019-09-17 DIAGNOSIS — F419 Anxiety disorder, unspecified: Secondary | ICD-10-CM

## 2019-09-17 DIAGNOSIS — Z888 Allergy status to other drugs, medicaments and biological substances status: Secondary | ICD-10-CM | POA: Diagnosis not present

## 2019-09-17 DIAGNOSIS — E440 Moderate protein-calorie malnutrition: Secondary | ICD-10-CM | POA: Diagnosis not present

## 2019-09-17 DIAGNOSIS — R52 Pain, unspecified: Secondary | ICD-10-CM | POA: Diagnosis not present

## 2019-09-17 DIAGNOSIS — M6281 Muscle weakness (generalized): Secondary | ICD-10-CM | POA: Diagnosis not present

## 2019-09-17 DIAGNOSIS — Z20828 Contact with and (suspected) exposure to other viral communicable diseases: Secondary | ICD-10-CM | POA: Diagnosis present

## 2019-09-17 DIAGNOSIS — I69354 Hemiplegia and hemiparesis following cerebral infarction affecting left non-dominant side: Secondary | ICD-10-CM | POA: Diagnosis not present

## 2019-09-17 DIAGNOSIS — R279 Unspecified lack of coordination: Secondary | ICD-10-CM | POA: Diagnosis not present

## 2019-09-17 DIAGNOSIS — I2510 Atherosclerotic heart disease of native coronary artery without angina pectoris: Secondary | ICD-10-CM | POA: Diagnosis present

## 2019-09-17 DIAGNOSIS — Z881 Allergy status to other antibiotic agents status: Secondary | ICD-10-CM | POA: Diagnosis not present

## 2019-09-17 DIAGNOSIS — S52502D Unspecified fracture of the lower end of left radius, subsequent encounter for closed fracture with routine healing: Secondary | ICD-10-CM | POA: Diagnosis not present

## 2019-09-17 DIAGNOSIS — I69054 Hemiplegia and hemiparesis following nontraumatic subarachnoid hemorrhage affecting left non-dominant side: Secondary | ICD-10-CM | POA: Diagnosis not present

## 2019-09-17 DIAGNOSIS — E1151 Type 2 diabetes mellitus with diabetic peripheral angiopathy without gangrene: Secondary | ICD-10-CM | POA: Diagnosis present

## 2019-09-17 DIAGNOSIS — R29713 NIHSS score 13: Secondary | ICD-10-CM | POA: Diagnosis present

## 2019-09-17 DIAGNOSIS — S52602A Unspecified fracture of lower end of left ulna, initial encounter for closed fracture: Secondary | ICD-10-CM | POA: Diagnosis present

## 2019-09-17 DIAGNOSIS — N390 Urinary tract infection, site not specified: Secondary | ICD-10-CM | POA: Diagnosis present

## 2019-09-17 DIAGNOSIS — E861 Hypovolemia: Secondary | ICD-10-CM | POA: Diagnosis not present

## 2019-09-17 DIAGNOSIS — W19XXXA Unspecified fall, initial encounter: Secondary | ICD-10-CM | POA: Diagnosis present

## 2019-09-17 DIAGNOSIS — R402411 Glasgow coma scale score 13-15, in the field [EMT or ambulance]: Secondary | ICD-10-CM | POA: Diagnosis not present

## 2019-09-17 DIAGNOSIS — Z743 Need for continuous supervision: Secondary | ICD-10-CM | POA: Diagnosis not present

## 2019-09-17 DIAGNOSIS — I9589 Other hypotension: Secondary | ICD-10-CM | POA: Diagnosis not present

## 2019-09-17 DIAGNOSIS — R4182 Altered mental status, unspecified: Secondary | ICD-10-CM | POA: Diagnosis not present

## 2019-09-17 DIAGNOSIS — E1159 Type 2 diabetes mellitus with other circulatory complications: Secondary | ICD-10-CM | POA: Diagnosis not present

## 2019-09-17 DIAGNOSIS — S199XXA Unspecified injury of neck, initial encounter: Secondary | ICD-10-CM | POA: Diagnosis not present

## 2019-09-17 DIAGNOSIS — N179 Acute kidney failure, unspecified: Secondary | ICD-10-CM | POA: Diagnosis present

## 2019-09-17 DIAGNOSIS — I672 Cerebral atherosclerosis: Secondary | ICD-10-CM | POA: Diagnosis present

## 2019-09-17 DIAGNOSIS — R278 Other lack of coordination: Secondary | ICD-10-CM | POA: Diagnosis not present

## 2019-09-17 DIAGNOSIS — R1312 Dysphagia, oropharyngeal phase: Secondary | ICD-10-CM | POA: Diagnosis not present

## 2019-09-17 DIAGNOSIS — M19012 Primary osteoarthritis, left shoulder: Secondary | ICD-10-CM | POA: Diagnosis not present

## 2019-09-17 LAB — HEMOGLOBIN A1C
Hgb A1c MFr Bld: 9.6 % — ABNORMAL HIGH (ref 4.8–5.6)
Mean Plasma Glucose: 228.82 mg/dL

## 2019-09-17 LAB — BASIC METABOLIC PANEL
Anion gap: 6 (ref 5–15)
BUN: 31 mg/dL — ABNORMAL HIGH (ref 8–23)
CO2: 19 mmol/L — ABNORMAL LOW (ref 22–32)
Calcium: 7.9 mg/dL — ABNORMAL LOW (ref 8.9–10.3)
Chloride: 113 mmol/L — ABNORMAL HIGH (ref 98–111)
Creatinine, Ser: 1.05 mg/dL — ABNORMAL HIGH (ref 0.44–1.00)
GFR calc Af Amer: 58 mL/min — ABNORMAL LOW (ref >60)
GFR calc non Af Amer: 50 mL/min — ABNORMAL LOW (ref >60)
Glucose, Bld: 108 mg/dL — ABNORMAL HIGH (ref 70–99)
Potassium: 3.5 mmol/L (ref 3.5–5.1)
Sodium: 138 mmol/L (ref 135–145)

## 2019-09-17 LAB — CBC
HCT: 34.7 % — ABNORMAL LOW (ref 36.0–46.0)
Hemoglobin: 10.8 g/dL — ABNORMAL LOW (ref 12.0–15.0)
MCH: 26.5 pg (ref 26.0–34.0)
MCHC: 31.1 g/dL (ref 30.0–36.0)
MCV: 85.3 fL (ref 80.0–100.0)
Platelets: 295 10*3/uL (ref 150–400)
RBC: 4.07 MIL/uL (ref 3.87–5.11)
RDW: 14.3 % (ref 11.5–15.5)
WBC: 10.5 10*3/uL (ref 4.0–10.5)
nRBC: 0 % (ref 0.0–0.2)

## 2019-09-17 LAB — URINALYSIS, ROUTINE W REFLEX MICROSCOPIC
Bilirubin Urine: NEGATIVE
Glucose, UA: NEGATIVE mg/dL
Ketones, ur: NEGATIVE mg/dL
Nitrite: NEGATIVE
Protein, ur: NEGATIVE mg/dL
Specific Gravity, Urine: 1.014 (ref 1.005–1.030)
WBC, UA: >50 WBC/hpf — ABNORMAL HIGH (ref 0–5)
pH: 5 (ref 5.0–8.0)

## 2019-09-17 LAB — LIPID PANEL
Cholesterol: 92 mg/dL (ref 0–200)
HDL: 31 mg/dL — ABNORMAL LOW (ref >40)
LDL Cholesterol: 41 mg/dL (ref 0–99)
Total CHOL/HDL Ratio: 3 RATIO
Triglycerides: 99 mg/dL (ref <150)
VLDL: 20 mg/dL (ref 0–40)

## 2019-09-17 LAB — GLUCOSE, CAPILLARY: Glucose-Capillary: 139 mg/dL — ABNORMAL HIGH (ref 70–99)

## 2019-09-17 LAB — SARS CORONAVIRUS 2 (TAT 6-24 HRS): SARS Coronavirus 2: NEGATIVE

## 2019-09-17 LAB — CBG MONITORING, ED
Glucose-Capillary: 109 mg/dL — ABNORMAL HIGH (ref 70–99)
Glucose-Capillary: 113 mg/dL — ABNORMAL HIGH (ref 70–99)

## 2019-09-17 LAB — TSH: TSH: 0.499 u[IU]/mL (ref 0.350–4.500)

## 2019-09-17 LAB — T4, FREE: Free T4: 1.3 ng/dL — ABNORMAL HIGH (ref 0.61–1.12)

## 2019-09-17 LAB — ECHOCARDIOGRAM COMPLETE
Height: 63 in
Weight: 2716.07 oz

## 2019-09-17 LAB — MRSA PCR SCREENING: MRSA by PCR: NEGATIVE

## 2019-09-17 MED ORDER — MORPHINE SULFATE (PF) 2 MG/ML IV SOLN
1.0000 mg | INTRAVENOUS | Status: DC | PRN
  Administered 2019-09-17 – 2019-09-19 (×3): 1 mg via INTRAVENOUS
  Filled 2019-09-17 (×5): qty 1

## 2019-09-17 MED ORDER — ATORVASTATIN CALCIUM 40 MG PO TABS
40.0000 mg | ORAL_TABLET | Freq: Every day | ORAL | Status: DC
  Administered 2019-09-17 – 2019-09-27 (×11): 40 mg via ORAL
  Filled 2019-09-17 (×11): qty 1

## 2019-09-17 MED ORDER — INSULIN GLARGINE 100 UNIT/ML ~~LOC~~ SOLN
20.0000 [IU] | Freq: Every day | SUBCUTANEOUS | Status: DC
  Administered 2019-09-17 – 2019-09-27 (×11): 20 [IU] via SUBCUTANEOUS
  Filled 2019-09-17 (×13): qty 0.2

## 2019-09-17 MED ORDER — POTASSIUM CHLORIDE CRYS ER 10 MEQ PO TBCR
10.0000 meq | EXTENDED_RELEASE_TABLET | Freq: Every day | ORAL | Status: DC
  Administered 2019-09-17: 20 meq via ORAL
  Administered 2019-09-18 – 2019-09-28 (×11): 10 meq via ORAL
  Filled 2019-09-17 (×12): qty 1

## 2019-09-17 MED ORDER — ENOXAPARIN SODIUM 40 MG/0.4ML ~~LOC~~ SOLN: 40.0000 mg | Freq: Every day | SUBCUTANEOUS | Status: DC

## 2019-09-17 MED ORDER — INSULIN ASPART 100 UNIT/ML ~~LOC~~ SOLN
0.0000 [IU] | Freq: Three times a day (TID) | SUBCUTANEOUS | Status: DC
  Administered 2019-09-18: 13:00:00 5 [IU] via SUBCUTANEOUS
  Administered 2019-09-18: 06:00:00 2 [IU] via SUBCUTANEOUS
  Administered 2019-09-18: 17:00:00 5 [IU] via SUBCUTANEOUS
  Administered 2019-09-19 (×2): 3 [IU] via SUBCUTANEOUS
  Administered 2019-09-19: 2 [IU] via SUBCUTANEOUS
  Administered 2019-09-20 (×2): 3 [IU] via SUBCUTANEOUS
  Administered 2019-09-20 – 2019-09-21 (×2): 2 [IU] via SUBCUTANEOUS
  Administered 2019-09-21: 5 [IU] via SUBCUTANEOUS
  Administered 2019-09-22: 2 [IU] via SUBCUTANEOUS
  Administered 2019-09-22 – 2019-09-23 (×2): 3 [IU] via SUBCUTANEOUS
  Administered 2019-09-23: 11 [IU] via SUBCUTANEOUS
  Administered 2019-09-23: 18:00:00 2 [IU] via SUBCUTANEOUS
  Administered 2019-09-24 – 2019-09-25 (×4): 3 [IU] via SUBCUTANEOUS
  Administered 2019-09-25 – 2019-09-26 (×2): 5 [IU] via SUBCUTANEOUS
  Administered 2019-09-26: 3 [IU] via SUBCUTANEOUS
  Administered 2019-09-26: 13:00:00 5 [IU] via SUBCUTANEOUS
  Administered 2019-09-27 (×2): 11 [IU] via SUBCUTANEOUS
  Administered 2019-09-27: 3 [IU] via SUBCUTANEOUS
  Administered 2019-09-28: 5 [IU] via SUBCUTANEOUS

## 2019-09-17 MED ORDER — IOHEXOL 350 MG/ML SOLN
75.0000 mL | Freq: Once | INTRAVENOUS | Status: AC | PRN
  Administered 2019-09-17: 75 mL via INTRAVENOUS

## 2019-09-17 MED ORDER — OXYCODONE HCL 5 MG PO TABS: 5.0000 mg | ORAL_TABLET | ORAL | Status: DC | PRN

## 2019-09-17 MED ORDER — APIXABAN 5 MG PO TABS
5.0000 mg | ORAL_TABLET | Freq: Two times a day (BID) | ORAL | Status: DC
  Administered 2019-09-17 – 2019-09-28 (×23): 5 mg via ORAL
  Filled 2019-09-17 (×12): qty 1
  Filled 2019-09-17: qty 2
  Filled 2019-09-17 (×12): qty 1

## 2019-09-17 MED ORDER — PERFLUTREN LIPID MICROSPHERE
1.0000 mL | INTRAVENOUS | Status: AC | PRN
  Administered 2019-09-17: 3 mL via INTRAVENOUS
  Filled 2019-09-17: qty 10

## 2019-09-17 MED ORDER — MAGNESIUM OXIDE 400 (241.3 MG) MG PO TABS
800.0000 mg | ORAL_TABLET | Freq: Two times a day (BID) | ORAL | Status: DC
  Administered 2019-09-17 – 2019-09-28 (×22): 800 mg via ORAL
  Filled 2019-09-17 (×22): qty 2

## 2019-09-17 MED ORDER — SODIUM CHLORIDE 0.9 % IV SOLN
2.0000 g | Freq: Two times a day (BID) | INTRAVENOUS | Status: DC
  Administered 2019-09-17 – 2019-09-18 (×3): 2 g via INTRAVENOUS
  Filled 2019-09-17 (×3): qty 2

## 2019-09-17 MED ORDER — VENLAFAXINE HCL ER 75 MG PO CP24
150.0000 mg | ORAL_CAPSULE | Freq: Every day | ORAL | Status: DC
  Administered 2019-09-18 – 2019-09-28 (×11): 150 mg via ORAL
  Filled 2019-09-17 (×9): qty 2
  Filled 2019-09-17 (×2): qty 1
  Filled 2019-09-17 (×2): qty 2

## 2019-09-17 MED ORDER — ASPIRIN EC 81 MG PO TBEC
81.0000 mg | DELAYED_RELEASE_TABLET | Freq: Every day | ORAL | Status: DC
  Administered 2019-09-17 – 2019-09-28 (×12): 81 mg via ORAL
  Filled 2019-09-17 (×12): qty 1

## 2019-09-17 MED ORDER — SODIUM CHLORIDE 0.9 % IV SOLN
INTRAVENOUS | Status: DC
  Administered 2019-09-17 – 2019-09-20 (×4): via INTRAVENOUS

## 2019-09-17 MED ORDER — LEVOTHYROXINE SODIUM 50 MCG PO TABS
50.0000 ug | ORAL_TABLET | Freq: Every day | ORAL | Status: DC
  Administered 2019-09-17 – 2019-09-28 (×12): 50 ug via ORAL
  Filled 2019-09-17 (×12): qty 1

## 2019-09-17 NOTE — ED Notes (Signed)
Tele   Breakfast ordered  

## 2019-09-17 NOTE — ED Notes (Signed)
Lunch ordered 

## 2019-09-17 NOTE — Telephone Encounter (Signed)
Attempted to call pt. No answer. It also looks like that pt is inpatient right now.   Attempted to call pt daughter [name]. No answer.   Plan to see how her mom is doing.

## 2019-09-17 NOTE — Telephone Encounter (Signed)
I have called pt and family back to see how they are doing. They state they are hanging in there. [name] says that mom is having stroke like things. [name] is happy that mom got admitted so they get down to the bottom of what is going on.   She will give [place] an update.

## 2019-09-17 NOTE — Progress Notes (Signed)
Pharmacy Antibiotic Note  [name] is a 78 y.o. female admitted on 09/16/2019 with altered mental status.  Pharmacy has been consulted for vancomycin and cefepime dosing. Pt is afebrile and WBC is WNL. SCr improved to 1.05.   Plan: Change cefepime to 2gm IV Q12H Continue vancomycin as ordered for now Fu renal fxn, C&S, clinical status and peak/trough at SS  Height: 5\' 3"  (160 cm) Weight: 169 lb 12.1 oz (77 kg) IBW/kg (Calculated) : 52.4  Temp (24hrs), Avg:98.2 F (36.8 C), Min:97.9 F (36.6 C), Max:98.5 F (36.9 C)  Recent Labs  Lab 09/16/19 2110 09/16/19 2205 09/16/19 2300 09/17/19 0245  WBC 9.8  --   --  10.5  CREATININE 1.48*  --   --  1.05*  LATICACIDVEN  --  2.2* 2.0*  --     Estimated Creatinine Clearance: 42.7 mL/min (A) (by C-G formula based on SCr of 1.05 mg/dL (H)).    Thank you for allowing pharmacy to be a part of this patient's care.  Salome Arnt, PharmD, BCPS Clinical Pharmacist Please see AMION for all pharmacy numbers 09/17/2019 8:46 AM

## 2019-09-17 NOTE — Progress Notes (Addendum)
PROGRESS NOTE    [name]  NTI:[id number] DOB: 02/28/1941 DOA: 09/16/2019 PCP: Georgina Quint, MD   Brief Narrative: [name] is [name] 78 y.o. 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  Patient unable to provide history and does not know why she is in the hospital.  No family at bedside. Per EDP report, daughter noticed that about [name] week ago she seemed to have left-sided neglect and some slurred speech.  Facility reportedly does not feel that she has any deviations from her baseline but requested ED eval due to family concerns.  Patient denies any frank pain but was intolerant of having her left shoulder examined or be turned over for respiratory exam.  She was recently evaluated in the ED last week for [name] fall.  ED Course:  She was afebrile and hypotensive down to 80s over 60 which improved after 3 L of fluid resuscitation up to 150s over 90.  CBC showed no leukocytosis or anemia.  CMP showed mildly elevated creatinine of 1.48 from [name] prior of 1.09.  Lactate of 2.2.  Urinalysis shows large leukocyte, negative nitrite and many WBC.   Assessment & Plan:   Active Problems:   Diabetes mellitus (HCC)   Chronic diastolic (congestive) heart failure (HCC)   Atrial fibrillation, chronic (HCC)   Hypotension   Cerebral thrombosis with cerebral infarction   Right frontoparietal lobe infarct - Discussed CT finding with Neurology Dr. Wilford Corner - believes new stroke likely sometime between last CT head on 10/6 and this admission.  - per neurology, suspect R frontal infarct 2/2 progressive R ICA stenosis in setting of urosepsis - Pt unable to get MRI brain due to foreign metal object in the past  - will obtain CTA head and neck -  moderate diffuse narrowing of mid distal cervical R ICA (new/progressed from CTA from 2019 and concerning for possible interval dissection), 65% atheromatous stenoses about carotid bifurcations bilaterally - severe and extensive atherosclerotic change throughout intracranial circulation (multifocal severe R A1 and V4 stenoses), short segment severe proximal R V1 stenosis.  Severe near occlsuive stenosis at the mid right subclavian artery.  Small layering bilateral pleural effusions. - unable to obtain MRI - repeat head CT pending - echocardiogram - EF 65-70% (see report) - continue Eliquis and aspirin  - Obtain A1c (9.6) and lipids (LDL 41, HDL 31) - PT/OT/SLT - Frequent neuro checks and keep on telemetry - Avoid low BP with severe stenosis, continue IVF and follow  Left sided weakness - multifactorial from new right sided infarct and recent fall -pt intolerant of movement to left upper extremity due to left shoulder pain - she c/o pain all over, difficult to tell significance of this - Left shoulder X-ray negative. L forearm and hand films negative for acute findings - incomplete healing of distal radial fx seen on 06/2019, will touch base with orthopedics in AM  Possible Sepsis - Urine with large LE, rare bacteria, negative nitrite, RBC's and >50 WBC - blood and urine cx pending  - CXR with minimal basilar atelectasis - Started on vanc/cefepime - will continue for now, deescalate as able  Hypotension likely secondary to sepsis  - improved with 3L fluid resuscitation, continue IVF - Continue to closely monitor to avoid any further hypotension especially in  the setting of new infarct - hold antihypertensives  AKI: 2/2 above, improving  Atrial fibrillation -Currently rate controlled. -Continue Eliquis -Hold beta-blocker for permissive hypertension in the setting of new infarct  Type 2 diabetes -Hemoglobin A1c of 11.5 few weeks ago -Normally on 30 units of Lantus at bedtime -  start on 20 units Lantus qHS and moderate SSI  Chronic diastolic CHF -Stable.  Patient is hypovolemic with improved blood pressure s/p fluid resuscitation.  Hypothyroidism -Continue levothyroxine  Anxiety - continue Effexor   DVT prophylaxis: eliquis Code Status: full  Family Communication: none at bedside - husband over phone Disposition Plan: pending further improvement   Consultants:   neurology  Procedures:  Echo IMPRESSIONS    1. Left ventricular ejection fraction, by visual estimation, is 65-70%. The left ventricle has normal function. Normal left ventricular size. Left ventricular septal wall thickness was severely increased. Severely increased left ventricular posterior  wall thickness.  2. Definity contrast agent was given IV to delineate the left ventricular endocardial borders.  3. Left ventricular diastolic Doppler parameters are indeterminate pattern of LV diastolic filling. The left ventricular diastology could not be evaluatedsecondary to atrial fibrillation. .  4. There is [name] dynamic mid LV cavitary gradient measuring at rest.  5. Global right ventricle has normal systolic function.The right ventricular size is normal. No increase in right ventricular wall thickness.  6. Left atrial size was mildly dilated.  7. Right atrial size was normal.  8. Mild to moderate mitral annular calcification.. Trace mitral valve regurgitation. No evidence of mitral stenosis.  9. The tricuspid valve is normal in structure. Tricuspid valve regurgitation is trivial. 10. The aortic valve was not well visualized. Aortic valve regurgitation was not visualized by color flow Doppler. Structurally normal aortic valve, with no evidence of sclerosis or stenosis. 11. The pulmonic valve was normal in structure. Pulmonic valve regurgitation is not visualized by color flow Doppler. 12. Normal pulmonary artery systolic pressure. 13. The inferior vena cava is normal in size with  greater than 50% respiratory variability, suggesting right atrial pressure of 3 mmHg.  Antimicrobials:  Anti-infectives (From admission, onward)   Start     Dose/Rate Route Frequency Ordered Stop   09/17/19 2200  vancomycin (VANCOCIN) IVPB 1000 mg/200 mL premix     1,000 mg 200 mL/hr over 60 Minutes Intravenous Every 24 hours 09/16/19 2241     09/17/19 2000  ceFEPIme (MAXIPIME) 2 g in sodium chloride 0.9 % 100 mL IVPB  Status:  Discontinued     2 g 200 mL/hr over 30 Minutes Intravenous Every 24 hours 09/16/19 2241 09/17/19 0843   09/17/19 1000  ceFEPIme (MAXIPIME) 2 g in sodium chloride 0.9 % 100 mL IVPB     2 g 200 mL/hr over 30 Minutes Intravenous Every 12 hours 09/17/19 0843     09/16/19 2330  vancomycin (VANCOCIN) 1,500 mg in sodium chloride 0.9 % 500 mL IVPB     1,500 mg 250 mL/hr over 120 Minutes Intravenous  Once 09/16/19 2315 09/17/19 0304   09/16/19 2245  vancomycin (VANCOCIN) 500 mg in sodium chloride 0.9 % 100 mL IVPB  Status:  Discontinued     500 mg 100 mL/hr over 60 Minutes Intravenous  Once 09/16/19 2241 09/16/19 2315   09/16/19 2230  ceFEPIme (MAXIPIME) 2 g in sodium chloride 0.9 % 100 mL IVPB     2 g 200 mL/hr over 30 Minutes Intravenous  Once 09/16/19 2216 09/17/19 0028   09/16/19 2230  metroNIDAZOLE (FLAGYL)  IVPB 500 mg     500 mg 100 mL/hr over 60 Minutes Intravenous  Once 09/16/19 2216 09/17/19 0052   09/16/19 2230  vancomycin (VANCOCIN) IVPB 1000 mg/200 mL premix  Status:  Discontinued     1,000 mg 200 mL/hr over 60 Minutes Intravenous  Once 09/16/19 2216 09/16/19 2315     Subjective: C/o pain - mostly in LUE   Objective: Vitals:   09/17/19 1453 09/17/19 1530 09/17/19 1545 09/17/19 1630  BP: 96/75 104/82 99/70 117/84  Pulse: (!) 103 (!) 104 87 98  Resp: Temp: 98 F (36.7 C)     TempSrc:      SpO2: 100% 100% 100% 100%  Weight:      Height:        Intake/Output Summary (Last 24 hours) at 09/17/2019 1827 Last data filed at 09/17/2019  1200 Gross per 24 hour  Intake --  Output 1100 ml  Net -1100 ml   Filed Weights   09/17/19 0200  Weight: 77 kg    Examination:  General exam: Appears uncomfortable with c/o pain Respiratory system: Clear to auscultation. Respiratory effort normal. Cardiovascular system: S1 & S2 heard, RRR. Gastrointestinal system: Abdomen is nondistended, soft and nontender. Central nervous system: Alert.  Agitated due to pain.  Moving RUE and RLE with good strength.  LUE limited due to pain, but seems weaker.  LLE weakness noted as well. Extremities: no LEE Skin: No rashes, lesions or ulcers Psychiatry: Judgement and insight appear normal. Mood & affect appropriate.     Data Reviewed: I have personally reviewed following labs and imaging studies  CBC: Recent Labs  Lab 09/16/19 2110 09/17/19 0245  WBC 9.8 10.5  HGB 13.3 10.8*  HCT 41.6 34.7*  MCV 83.5 85.3  PLT 366 295   Basic Metabolic Panel: Recent Labs  Lab 09/16/19 2110 09/17/19 0245  NA 134* 138  K 4.1 3.5  CL 99 113*  CO2 21* 19*  GLUCOSE 192* 108*  BUN 40* 31*  CREATININE 1.48* 1.05*  CALCIUM 9.9 7.9*   GFR: Estimated Creatinine Clearance: 42.7 mL/min (Pantelis Elgersma) (by C-G formula based on SCr of 1.05 mg/dL (H)). Liver Function Tests: Recent Labs  Lab 09/16/19 2110  AST 41  ALT 42  ALKPHOS 109  BILITOT 0.6  PROT 7.0  ALBUMIN 3.5   No results for input(s): LIPASE, AMYLASE in the last 168 hours. No results for input(s): AMMONIA in the last 168 hours. Coagulation Profile: Recent Labs  Lab 09/16/19 2300  INR 1.3*   Cardiac Enzymes: No results for input(s): CKTOTAL, CKMB, CKMBINDEX, TROPONINI in the last 168 hours. BNP (last 3 results) No results for input(s): PROBNP in the last 8760 hours. HbA1C: Recent Labs    09/17/19 0245  HGBA1C 9.6*   CBG: Recent Labs  Lab 09/16/19 2108 09/17/19 0954 09/17/19 1502  GLUCAP 187* 109* 113*   Lipid Profile: Recent Labs    09/17/19 0245  CHOL 92  HDL 31*   LDLCALC 41  TRIG 99  CHOLHDL 3.0   Thyroid Function Tests: Recent Labs    09/17/19 0112  TSH 0.499  FREET4 1.30*   Anemia Panel: No results for input(s): VITAMINB12, FOLATE, FERRITIN, TIBC, IRON, RETICCTPCT in the last 72 hours. Sepsis Labs: Recent Labs  Lab 09/16/19 2205 09/16/19 2300  LATICACIDVEN 2.2* 2.0*    Recent Results (from the past 240 hour(s))  Blood Culture (routine x 2)     Status: None (Preliminary result)   Collection Time:  09/16/19 10:22 PM   Specimen: BLOOD LEFT FOREARM  Result Value Ref Range Status   Specimen Description BLOOD LEFT FOREARM  Final   Special Requests   Final    BOTTLES DRAWN AEROBIC AND ANAEROBIC Blood Culture results may not be optimal due to an excessive volume of blood received in culture bottles   Culture   Final    NO GROWTH < 12 HOURS Performed at [facility], [address] [place], [place] [address]    Report Status PENDING  Incomplete  Blood Culture (routine x 2)     Status: None (Preliminary result)   Collection Time: 09/16/19 10:22 PM   Specimen: BLOOD RIGHT HAND  Result Value Ref Range Status   Specimen Description BLOOD RIGHT HAND  Final   Special Requests   Final    BOTTLES DRAWN AEROBIC AND ANAEROBIC Blood Culture results may not be optimal due to an inadequate volume of blood received in culture bottles   Culture   Final    NO GROWTH < 12 HOURS Performed at [facility], [address] [place], [place] [address]    Report Status PENDING  Incomplete  SARS CORONAVIRUS 2 (TAT 6-24 HRS) Nasopharyngeal Nasopharyngeal Swab     Status: None   Collection Time: 09/16/19 11:56 PM   Specimen: Nasopharyngeal Swab  Result Value Ref Range Status   SARS Coronavirus 2 NEGATIVE NEGATIVE Final    Comment: (NOTE) SARS-CoV-2 target nucleic acids are NOT DETECTED. The SARS-CoV-2 RNA is generally detectable in upper and lower respiratory specimens during the acute phase of infection. Negative results do not preclude  SARS-CoV-2 infection, do not rule out co-infections with other pathogens, and should not be used as the sole basis for treatment or other patient management decisions. Negative results must be combined with clinical observations, patient history, and epidemiological information. The expected result is Negative. Fact Sheet for Patients: HairSlick.no Fact Sheet for Healthcare Providers: quierodirigir.com This test is not yet approved or cleared by the [place] FDA and  has been authorized for detection and/or diagnosis of SARS-CoV-2 by FDA under an Emergency Use Authorization (EUA). This EUA will remain  in effect (meaning this test can be used) for the duration of the COVID-19 declaration under Section 56 4(b)(1) of the Act, 21 U.S.C. section 360bbb-3(b)(1), unless the authorization is terminated or revoked sooner. Performed at [facility], [address] [place], [place] [address]          Radiology Studies: Ct Angio Head W Or Wo Contrast  Result Date: 09/17/2019 CLINICAL DATA:  Follow-up examination for acute stroke, left-sided weakness. EXAM: CT ANGIOGRAPHY HEAD AND NECK TECHNIQUE: Multidetector CT imaging of the head and neck was performed using the standard protocol during bolus administration of intravenous contrast. Multiplanar CT image reconstructions and MIPs were obtained to evaluate the vascular anatomy. Carotid stenosis measurements (when applicable) are obtained utilizing NASCET criteria, using the distal internal carotid diameter as the denominator. CONTRAST:  75mL OMNIPAQUE IOHEXOL 350 MG/ML SOLN COMPARISON:  Prior CT from earlier same day as well as previous CTA from 05/19/2018. FINDINGS: CTA NECK FINDINGS Aortic arch: Visualized aortic arch of normal caliber with normal 3 vessel morphology. Moderate atherosclerotic change about the arch and origin of the great vessels without hemodynamically significant  stenosis. Extensive atherosclerotic change seen throughout the visualized left subclavian artery without high-grade stenosis. There is Oleva Koo severe near occlusive stenosis involving the mid right subclavian artery, grossly stable from previous (series 7, image  278). Right carotid system: Right CCA patent from its origin to the bifurcation without stenosis. Bulky calcified plaque about the right bifurcation with associated stenosis of up to 65% by NASCET criteria. Distally, the right ICA is somewhat irregular and attenuated, and diffusely narrowed as compared to the previous CTA, concerning for possible dissection (series 7, images 179-155). Finding is age indeterminate. No frank raised dissection flap visualized. Secondary moderate diffuse narrowing over the affected segment. Right ICA otherwise patent at the skull base. Left carotid system: Mild atheromatous stenosis at the origin of the left CCA. Left CCA otherwise widely patent to the bifurcation without significant stenosis. Bulky calcified plaque at the left bifurcation with associated stenosis of up to 65% by NASCET criteria. Superimposed small penetrating plaque noted at the proximal left ICA (series 7, image 213). Scattered multifocal centric plaque within the distal left ICA without significant stenosis. Left ICA otherwise patent to the skull base. Vertebral arteries: Both vertebral arteries arise from the subclavian arteries. Right vertebral artery dominant with Anders Hohmann diffusely hypoplastic left vertebral artery. Focal plaque at the origin of the right vertebral artery with secondary severe stenosis, grossly stable from previous. Right vertebral otherwise widely patent to the skull base. Hypoplastic left vertebral artery severely diseased, and essentially occludes at the skull base, relatively stable from previous. Skeleton: No acute osseous finding. No discrete osseous lesions. Moderate cervical spondylosis at C4-5 through C6-7. Other neck: No other acute soft  tissue abnormality within the neck. Upper chest: Few mildly enlarged right paratracheal and pretracheal nodes measure up to 13 mm, indeterminate, but could be reactive. Small layering bilateral pleural effusions with associated atelectasis. Diffuse interlobular septal thickening compatible with pulmonary interstitial edema. Subcentimeter calcified granuloma noted at the central aspect of the left upper lobe, stable. Review of the MIP images confirms the above findings CTA HEAD FINDINGS Anterior circulation: Petrous left ICA patent. Extensive atheromatous plaque throughout the left carotid siphon with associated moderate diffuse narrowing. Lanasia Porras more focal severe stenosis again noted at the supraclinoid left ICA, stable (series 7, image 114). Left ICA terminus perfused. Left M1 irregular but patent without high-grade stenosis. Negative left MCA bifurcation. Extensive small vessel atheromatous irregularity throughout the left MCA branches without large vessel occlusion. Previously seen left M3 occlusion has recanalized. Petrous right ICA patent. Extensive plaque throughout the right carotid siphon with moderate to severe multifocal narrowing, similar to previous. Right ICA terminus perfused. Right M1 irregular but patent and bifurcates early. Extensive atheromatous irregularity throughout the right MCA branches without proximal occlusion. A1 segments irregular but patent bilaterally. Right A1 hypoplastic. Normal anterior communicating artery. Extensive atheromatous change throughout the left A2 segment with up to moderate multifocal stenoses. Right A2 severely disease and attenuated with multifocal severe stenoses, although does remain patent to its distal aspect. Appearance is stable from previous. 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 Moderate atherosclerotic change throughout the dominant right V4 segment with associated moderate to severe multifocal stenoses,  stable. Right PICA patent proximally. Left PICA not visualized. Basilar diminutive with diffuse atheromatous irregularity but remains patent to its distal aspect, unchanged. Superior cerebral arteries patent bilaterally. Both of the posterior cerebral arteries primarily supplied via the basilar. PCAs demonstrate extensive atheromatous irregularity but are patent to their distal aspects. Venous sinuses: Grossly patent allowing for timing of the contrast bolus. Anatomic variants: None significant. Review of the MIP images confirms the above findings IMPRESSION: 1. Negative CTA for large vessel occlusion. 2. Moderate diffuse narrowing of the mid-distal  cervical right ICA, new/progressed relative to previous CTA from 2019, and suspicious for possible interval dissection, age indeterminate. No raised dissection flap or significant luminal irregularity. 3. 65% atheromatous stenoses about the carotid bifurcations bilaterally. 4. Severe and extensive atherosclerotic change throughout the intracranial circulation, relatively unchanged from previous. Again, most notable findings include multifocal severe right A2 and V4 stenoses. 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 5. Short-segment severe proximal right V1 stenosis. 6. Severe near occlusive stenosis at the mid right subclavian artery. 7. Small layering bilateral pleural effusions with associated pulmonary interstitial edema. Electronically Signed   By: Rise MuBenjamin  McClintock M.D.   On: 09/17/2019 04:40   Dg Forearm Left  Result Date: 09/17/2019 CLINICAL DATA:  History of prior distal radial fracture, follow-up exam EXAM: LEFT FOREARM - 2 VIEW COMPARISON:  06/29/2019 FINDINGS: Prior distal radial and ulnar fractures are again identified and stable. Intra-articular involvement is noted. No significant callus formation is noted. IMPRESSION: Stable appearance of distal radial and ulnar fractures. Electronically Signed   By: Alcide CleverMark  Lukens M.D.   On:  09/17/2019 12:22   Ct Head Wo Contrast  Result Date: 09/17/2019 CLINICAL DATA:  Altered mental status EXAM: CT HEAD WITHOUT CONTRAST TECHNIQUE: Contiguous axial images were obtained from the base of the skull through the vertex without intravenous contrast. COMPARISON:  August 31, 2019 FINDINGS: Brain: Since the prior exam there is Theophile Harvie increasing area of hypoattenuation within the right frontoparietal lobe, best seen on series 3, image 23. Again seen are changes of prior left MCA distribution infarct. There is no significant interval progression. There is dilatation the ventricles and sulci consistent with age-related atrophy. Low-attenuation changes in the deep white matter consistent with small vessel ischemia. No extra-axial collections. Vascular: No hyperdense vessel or unexpected calcification. Skull: The skull is intact. No fracture or focal lesion identified. Sinuses/Orbits: The visualized paranasal sinuses and mastoid air cells are clear. The orbits and globes intact. Other: None IMPRESSION: 1. worsening area of hypoattenuation in the right frontoparietal lobe, consistent evolution of infarct. 2. Stable changes of prior infarct involving the left MCA territory. 3. Findings consistent with age related atrophy and chronic small vessel ischemia 4. These results were called by telephone at the time of interpretation on 09/17/2019 at 1:23 am to provider [facility] , who verbally acknowledged these results. Electronically Signed   By: Jonna ClarkBindu  Avutu M.D.   On: 09/17/2019 01:24   Ct Angio Neck W Or Wo Contrast  Result Date: 09/17/2019 CLINICAL DATA:  Follow-up examination for acute stroke, left-sided weakness. EXAM: CT ANGIOGRAPHY HEAD AND NECK TECHNIQUE: Multidetector CT imaging of the head and neck was performed using the standard protocol during bolus administration of intravenous contrast. Multiplanar CT image reconstructions and MIPs were obtained to evaluate the vascular anatomy. Carotid stenosis  measurements (when applicable) are obtained utilizing NASCET criteria, using the distal internal carotid diameter as the denominator. CONTRAST:  75mL OMNIPAQUE IOHEXOL 350 MG/ML SOLN COMPARISON:  Prior CT from earlier same day as well as previous CTA from 05/19/2018. FINDINGS: CTA NECK FINDINGS Aortic arch: Visualized aortic arch of normal caliber with normal 3 vessel morphology. Moderate atherosclerotic change about the arch and origin of the great vessels without hemodynamically significant stenosis. Extensive atherosclerotic change seen throughout the visualized left subclavian artery without high-grade stenosis. There is Linetta Regner severe near occlusive stenosis involving the mid right subclavian artery, grossly stable from previous (series 7, image 278). Right carotid system: Right CCA patent from its origin to the bifurcation without  stenosis. Bulky calcified plaque about the right bifurcation with associated stenosis of up to 65% by NASCET criteria. Distally, the right ICA is somewhat irregular and attenuated, and diffusely narrowed as compared to the previous CTA, concerning for possible dissection (series 7, images 179-155). Finding is age indeterminate. No frank raised dissection flap visualized. Secondary moderate diffuse narrowing over the affected segment. Right ICA otherwise patent at the skull base. Left carotid system: Mild atheromatous stenosis at the origin of the left CCA. Left CCA otherwise widely patent to the bifurcation without significant stenosis. Bulky calcified plaque at the left bifurcation with associated stenosis of up to 65% by NASCET criteria. Superimposed small penetrating plaque noted at the proximal left ICA (series 7, image 213). Scattered multifocal centric plaque within the distal left ICA without significant stenosis. Left ICA otherwise patent to the skull base. Vertebral arteries: Both vertebral arteries arise from the subclavian arteries. Right vertebral artery dominant with Conception Doebler  diffusely hypoplastic left vertebral artery. Focal plaque at the origin of the right vertebral artery with secondary severe stenosis, grossly stable from previous. Right vertebral otherwise widely patent to the skull base. Hypoplastic left vertebral artery severely diseased, and essentially occludes at the skull base, relatively stable from previous. Skeleton: No acute osseous finding. No discrete osseous lesions. Moderate cervical spondylosis at C4-5 through C6-7. Other neck: No other acute soft tissue abnormality within the neck. Upper chest: Few mildly enlarged right paratracheal and pretracheal nodes measure up to 13 mm, indeterminate, but could be reactive. Small layering bilateral pleural effusions with associated atelectasis. Diffuse interlobular septal thickening compatible with pulmonary interstitial edema. Subcentimeter calcified granuloma noted at the central aspect of the left upper lobe, stable. Review of the MIP images confirms the above findings CTA HEAD FINDINGS Anterior circulation: Petrous left ICA patent. Extensive atheromatous plaque throughout the left carotid siphon with associated moderate diffuse narrowing. Cherene Dobbins more focal severe stenosis again noted at the supraclinoid left ICA, stable (series 7, image 114). Left ICA terminus perfused. Left M1 irregular but patent without high-grade stenosis. Negative left MCA bifurcation. Extensive small vessel atheromatous irregularity throughout the left MCA branches without large vessel occlusion. Previously seen left M3 occlusion has recanalized. Petrous right ICA patent. Extensive plaque throughout the right carotid siphon with moderate to severe multifocal narrowing, similar to previous. Right ICA terminus perfused. Right M1 irregular but patent and bifurcates early. Extensive atheromatous irregularity throughout the right MCA branches without proximal occlusion. A1 segments irregular but patent bilaterally. Right A1 hypoplastic. Normal anterior  communicating artery. Extensive atheromatous change throughout the left A2 segment with up to moderate multifocal stenoses. Right A2 severely disease and attenuated with multifocal severe stenoses, although does remain patent to its distal aspect. Appearance is stable from previous. 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 Moderate atherosclerotic change throughout the dominant right V4 segment with associated moderate to severe multifocal stenoses, stable. Right PICA patent proximally. Left PICA not visualized. Basilar diminutive with diffuse atheromatous irregularity but remains patent to its distal aspect, unchanged. Superior cerebral arteries patent bilaterally. Both of the posterior cerebral arteries primarily supplied via the basilar. PCAs demonstrate extensive atheromatous irregularity but are patent to their distal aspects. Venous sinuses: Grossly patent allowing for timing of the contrast bolus. Anatomic variants: None significant. Review of the MIP images confirms the above findings IMPRESSION: 1. Negative CTA for large vessel occlusion. 2. Moderate diffuse narrowing of the mid-distal cervical right ICA, new/progressed relative to previous CTA from 2019, and suspicious for possible  interval dissection, age indeterminate. No raised dissection flap or significant luminal irregularity. 3. 65% atheromatous stenoses about the carotid bifurcations bilaterally. 4. Severe and extensive atherosclerotic change throughout the intracranial circulation, relatively unchanged from previous. Again, most notable findings include multifocal severe right A2 and V4 stenoses. 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 5. Short-segment severe proximal right V1 stenosis. 6. Severe near occlusive stenosis at the mid right subclavian artery. 7. Small layering bilateral pleural effusions with associated pulmonary interstitial edema. Electronically Signed   By:  Jeannine Boga M.D.   On: 09/17/2019 04:40   Dg Hand 2 View Left  Result Date: 09/17/2019 CLINICAL DATA:  Pt brought in for multiple falls and left-sided weakness. AMS. Pt states hx of broken wrist APPROX.1 month ago. Pt c/o pain on entire left side. Pt unable to move arm at all, limited ROM with external rotation. Screaming with every touch. EXAM: The COMPARISON:  None. FINDINGS: Lucency through the distal radial metaphysis with disruption of the articular surface consistent with incomplete healing of fracture demonstrated on radiograph 06/29/2019. Fracture is intra-articular. No acute fracture in the wrist. Degenerative change at the first carpometacarpal joint. IMPRESSION: Incomplete healing of distal radial fracture. Electronically Signed   By: Suzy Bouchard M.D.   On: 09/17/2019 12:22   Dg Chest Port 1 View  Result Date: 09/16/2019 CLINICAL DATA:  Hypotension EXAM: PORTABLE CHEST 1 VIEW COMPARISON:  08/31/2019 FINDINGS: Mild cardiomegaly with aortic atherosclerosis. Streaky atelectasis at the bases. No consolidation, pleural effusion, or pneumothorax. IMPRESSION: 1. Mild cardiomegaly with minimal basilar atelectasis. Electronically Signed   By: Donavan Foil M.D.   On: 09/16/2019 22:52   Dg Humerus Left  Result Date: 09/17/2019 CLINICAL DATA:  Left arm pain. EXAM: LEFT HUMERUS - 2+ VIEW COMPARISON:  Shoulder radiograph 06/04/2019 FINDINGS: Cortical margins of the humerus are intact. Humeral head osteophytes with advanced glenohumeral osteoarthritis demonstrated on prior shoulder exam. Cortical margins of the humerus are intact. There is no evidence of fracture or other focal bone lesions. Soft tissues are unremarkable. IMPRESSION: No acute findings. Advanced shoulder osteoarthritis, demonstrated on prior shoulder exam. Electronically Signed   By: Keith Rake M.D.   On: 09/17/2019 00:42        Scheduled Meds:  apixaban  5 mg Oral BID   aspirin EC  81 mg Oral Daily    atorvastatin  40 mg Oral q1800   insulin aspart  0-15 Units Subcutaneous TID WC   insulin glargine  20 Units Subcutaneous QHS   levothyroxine  50 mcg Oral QAC breakfast   magnesium oxide  800 mg Oral BID   potassium chloride  10 mEq Oral Daily   venlafaxine XR  150 mg Oral Q breakfast   Continuous Infusions:  sodium chloride     ceFEPime (MAXIPIME) IV Stopped (09/17/19 1300)   vancomycin       LOS: 0 days    Time spent: over 30 min    Fayrene Helper, MD [facility] Pager AMION  If 7PM-7AM, please contact night-coverage [email or web address] Password [facility] 09/17/2019, 6:27 PM

## 2019-09-17 NOTE — Consult Note (Addendum)
Neurology Consultation  Reason for Consult: Left-sided weakness Referring Physician: Dr. Jodelle Gross, [facility]  CC: Left sided weakness History is obtained from: Chart  HPI: [name] is a 78 y.o. female brought in for multiple falls and possible left-sided weakness which according to the family was not at baseline. She has a past medical history of coronary artery disease, chronic pain on narcotics, prior stroke with residual aphasia and atrial fibrillation for which she is on anticoagulation in addition to the aspirin for intracranial atherosclerosis, was brought to the hospital when the daughter was concerned about some left-sided weakness.  According to the chart review, she was in the emergency room on 08/31/2019 and left-sided weakness was reported by the family at that time as well.  She continues to have the left-sided weakness with inability to move her arm.  It is unclear as to the timing of this left-sided symptoms.  Also noted in the chart is, some more speech changes than baseline. Patient does have some residual aphasia and is not a very reliable history provider. No family was present at this time at the bedside. In the ED she was noted to be hypotensive and there was concern for sepsis.  Admitted to medicine for the evaluation of that as well as imaging findings suggestive of a possible evolving right sided infarct. Neurology consultation for the abnormal imaging and clinical findings.  I had seen and evaluated the patient in 2019 when she had the left cerebral stroke due to possible left MCA M3 branch occlusion.  She is unable to get an MRI due to some sort of a metallic object around her eye which makes her MRI unsafe.   LKW: Unclear-could be weeks to months ago. tpa given?: no, completely unreliable history and last known well Premorbid modified Rankin scale (mRS): 4-has been bedridden with intermittent ability to get up and weight-bear.  ROS Unable to obtain due to  altered mental status/aphasia  Past Medical History:  Diagnosis Date  . Abdominal discomfort    "due to medication intolerance"  . CAD (coronary artery disease)    previous stent  . Chronic pain syndrome 09/10/2013   Dr Nelva Bush,   . [place] Patient virus infection 1988  . Excessive daytime sleepiness 12/05/2014   Patient has not been seen for a sleep study as ordered, and used adderall to keep alert in daytime.  She agreed  In a contract not to have any scheduled medication for pain treatment from [facility] and will not receive refills for Adderall, which was initiated by dr Orland Penman, PCP>   . Fall   . Hyperlipemia   . Hypersomnia, persistent 09/10/2013   Patient on  Stimulants .  Marland Kitchen Hypertension   . Narcotic addiction ([place]) 12/05/2014  . Obesity, unspecified 09/10/2013  . Shoulder joint pain    both shoulders  . Stroke [facility])     Family History  Problem Relation Age of Onset  . Hypertension Mother   . Diabetes Father   . Heart attack Father   . Heart attack Brother   . Heart attack Brother     Social History:   reports that she has never smoked. She has never used smokeless tobacco. She reports that she does not drink alcohol or use drugs.  Medications  Current Facility-Administered Medications:  .  apixaban (ELIQUIS) tablet 5 mg, 5 mg, Oral, BID, Tu, Ching T, DO .  aspirin EC tablet 81 mg, 81 mg, Oral, Daily, Tu, Ching T, DO .  atorvastatin (  LIPITOR) tablet 40 mg, 40 mg, Oral, q1800, Tu, Ching T, DO .  ceFEPIme (MAXIPIME) 2 g in sodium chloride 0.9 % 100 mL IVPB, 2 g, Intravenous, Q24H, Tu, Ching T, DO .  insulin aspart (novoLOG) injection 0-15 Units, 0-15 Units, Subcutaneous, TID WC, Tu, Ching T, DO .  insulin glargine (LANTUS) injection 20 Units, 20 Units, Subcutaneous, QHS, Tu, Ching T, DO .  levothyroxine (SYNTHROID) tablet 50 mcg, 50 mcg, Oral, QAC breakfast, Tu, Ching T, DO .  magnesium oxide (MAG-OX) tablet 800 mg, 800 mg, Oral, BID, Tu, Ching T, DO .  potassium chloride SA  (KLOR-CON) CR tablet 10 mEq, 10 mEq, Oral, Daily, Tu, Ching T, DO .  vancomycin (VANCOCIN) IVPB 1000 mg/200 mL premix, 1,000 mg, Intravenous, Q24H, Tu, Ching T, DO .  venlafaxine XR (EFFEXOR-XR) 24 hr capsule 150 mg, 150 mg, Oral, Q breakfast, Tu, Ching T, DO  Current Outpatient Medications:  .  ACCU-CHEK SOFTCLIX LANCETS lancets, USE TO TEST FOUR TIMES DAILY (Patient taking differently: 1 each by Other route See admin instructions. Use as directed to test blood sugar four times daily.), Disp: 100 each, Rfl: 5 .  aspirin EC 81 MG EC tablet, Take 1 tablet (81 mg total) by mouth daily., Disp: 30 tablet, Rfl: 0 .  atorvastatin (LIPITOR) 40 MG tablet, TAKE 1 TABLET BY MOUTH ONCE DAILY AT  6PM (Patient taking differently: Take 40 mg by mouth daily at 6 PM. ), Disp: 90 tablet, Rfl: 0 .  blood glucose meter kit and supplies, Dispense based on patient and insurance preference. Use up to four times daily as directed. (FOR ICD-9 250.00, 250.01). (Patient taking differently: 1 each by Other route See admin instructions. Use up to four times daily as directed. (FOR ICD-9 250.00, 250.01).), Disp: 1 each, Rfl: 0 .  Cholecalciferol (VITAMIN D3) 5000 units TBDP, Take 5,000 Units by mouth daily. , Disp: , Rfl:  .  Chromium 200 MCG TABS, Take 200 mcg by mouth daily., Disp: , Rfl:  .  DHA-EPA-Coenzyme Q10-Vitamin E (GNP COQ-10 & FISH OIL) 120-180-50-30 CAPS, Take 2 capsules by mouth daily. , Disp: , Rfl:  .  ELIQUIS 5 MG TABS tablet, Take 1 tablet by mouth twice daily (Patient taking differently: Take 5 mg by mouth 2 (two) times daily. ), Disp: 60 tablet, Rfl: 0 .  fexofenadine (ALLEGRA) 180 MG tablet, Take 180 mg by mouth daily as needed for allergies. , Disp: , Rfl:  .  glucose blood (ACCU-CHEK AVIVA PLUS) test strip, USE TO TEST FOUR TIMES DAILY (Patient taking differently: 1 each by Other route See admin instructions. Use as directed to test four times daily.), Disp: 100 each, Rfl: 0 .  hydrochlorothiazide  (HYDRODIURIL) 25 MG tablet, Take 25 mg by mouth daily., Disp: , Rfl:  .  HYDROcodone-acetaminophen (NORCO) 10-325 MG per tablet, Take 1-2 tablets by mouth every 4 (four) hours as needed for moderate pain. , Disp: , Rfl:  .  ibuprofen (ADVIL) 200 MG tablet, Take 400-600 mg by mouth every 4 (four) hours as needed for moderate pain. , Disp: , Rfl:  .  Insulin Syringe-Needle U-100 (INSULIN SYRINGE .5CC/31GX5/16") 31G X 5/16" 0.5 ML MISC, Use as directed (Patient taking differently: 1 application by Other route See admin instructions. Use as directed), Disp: 100 each, Rfl: 0 .  LANTUS SOLOSTAR 100 UNIT/ML Solostar Pen, Inject 30 Units into the skin at bedtime. , Disp: , Rfl:  .  levothyroxine (SYNTHROID) 50 MCG tablet, TAKE 1 TABLET BY MOUTH  ONCE DAILY BEFORE BREAKFAST (Patient taking differently: Take 50 mcg by mouth daily before breakfast. ), Disp: 30 tablet, Rfl: 0 .  lisinopril (ZESTRIL) 20 MG tablet, TAKE 1 TABLET BY MOUTH IN THE MORNING AND 1/2 (ONE-HALF) IN THE EVENING (Patient taking differently: Take 10 mg by mouth daily. ), Disp: 135 tablet, Rfl: 1 .  magnesium oxide (MAG-OX) 400 MG tablet, Take 800 mg by mouth 2 (two) times daily. , Disp: , Rfl:  .  metoprolol tartrate (LOPRESSOR) 50 MG tablet, Take 1 tablet by mouth twice daily (Patient taking differently: Take 50 mg by mouth 2 (two) times daily. ), Disp: 60 tablet, Rfl: 1 .  Misc. Devices ([place]) MISC, 1 Units by Does not apply route daily as needed. (Patient taking differently: 1 Units by Other route daily as needed. ), Disp: 1 each, Rfl: 0 .  potassium chloride (KLOR-CON) 10 MEQ tablet, TAKE 1  BY MOUTH ONCE DAILY (Patient taking differently: Take 10 mEq by mouth daily. ), Disp: 90 tablet, Rfl: 1 .  venlafaxine XR (EFFEXOR XR) 150 MG 24 hr capsule, Take 1 capsule (150 mg total) by mouth daily with breakfast., Disp: 90 capsule, Rfl: 3   Exam: Current vital signs: BP 115/89   Pulse 82   Temp 97.9 F (36.6 C) (Axillary)    Resp 19   Ht _0  (1.6 m)   Wt 77 kg   SpO2 100%   BMI 30.07 kg/m  Vital signs in last 24 hours: Temp:  [97.9 F (36.6 C)-98.5 F (36.9 C)] 97.9 F (36.6 C) (10/23 0109) Pulse Rate:  [55-109] 82 (10/23 0245) Resp:  [11-25] 19 (10/23 0245) BP: (80-133)/(61-115) 115/89 (10/23 0245) SpO2:  [98 %-100 %] 100 % (10/23 0245) Weight:  [77 kg] 77 kg (10/23 0200) General: Awake alert in no distress HEENT: Normocephalic atraumatic dry oral mucous membranes Lungs: Breathing well saturating normally on room air Cardiovascular: Regular rate rhythm Extremities:  bilateral pedal edema Neurological exam She is awake, alert, able to answer some questions.  Has significant difficulty naming, repeating.  Is able to follow simple commands.  Unable to follow complex commands. Her speech is also dysarthric. Cranial nerves: Pupils are equal round react to light, extraocular movements appear intact but she has a mild right gaze preference, visual fields are full, also has right lower facial nasolabial fold flattening-presumably baseline, auditory acuity normal, tongue and palate midline. Motor exam: Left upper extremity is plegic and also very tender to touch all over.  Left lower extremity is 3/5.  Right upper and lower extremity are antigravity 4+/5 without drift. Sensory exam: Decreased sensation on the left and she also seems to be extinguishing the left side and preferring the right side. Coordination: Difficult to assess but no gross dysmetria. Gait testing deferred for patient comfort and safety  NIHSS 1a Level of Conscious.: 0 1b LOC Questions: 2 1c LOC Commands: 0 2 Best Gaze: 1 3 Visual: 0 4 Facial Palsy: 1 5a Motor Arm - left: 4 5b Motor Arm - Right: 0 6a Motor Leg - Left: 1 6b Motor Leg - Right: 0 7 Limb Ataxia: 0 8 Sensory: 1 9 Best Language: 1 10 Dysarthria: 1 11 Extinct. and Inatten.:1  TOTAL: 13  Labs I have reviewed labs in epic and the results pertinent to this  consultation are:  CBC    Component Value Date/Time   WBC 9.8 09/16/2019 2110   RBC 4.98 09/16/2019 2110   HGB 13.3 09/16/2019 2110  HGB 11.4 03/25/2018 1730   HCT 41.6 09/16/2019 2110   HCT 35.1 03/25/2018 1730   PLT 366 09/16/2019 2110   PLT 265 03/25/2018 1730   MCV 83.5 09/16/2019 2110   MCV 79.9 08/30/2019 1702   MCV 82 03/25/2018 1730   MCH 26.7 09/16/2019 2110   MCHC 32.0 09/16/2019 2110   RDW 14.3 09/16/2019 2110   RDW 15.7 (H) 03/25/2018 1730   LYMPHSABS 1.6 08/31/2019 1339   LYMPHSABS 2.2 03/25/2018 1730   MONOABS 0.7 08/31/2019 1339   EOSABS 0.1 08/31/2019 1339   EOSABS 0.2 03/25/2018 1730   BASOSABS 0.0 08/31/2019 1339   BASOSABS 0.0 03/25/2018 1730    CMP     Component Value Date/Time   NA 134 (L) 09/16/2019 2110   NA 138 08/30/2019 1702   K 4.1 09/16/2019 2110   CL 99 09/16/2019 2110   CO2 21 (L) 09/16/2019 2110   GLUCOSE 192 (H) 09/16/2019 2110   BUN 40 (H) 09/16/2019 2110   BUN 22 08/30/2019 1702   CREATININE 1.48 (H) 09/16/2019 2110   CREATININE 0.85 07/19/2013 1654   CALCIUM 9.9 09/16/2019 2110   PROT 7.0 09/16/2019 2110   PROT 6.6 08/30/2019 1702   ALBUMIN 3.5 09/16/2019 2110   ALBUMIN 4.0 08/30/2019 1702   AST 41 09/16/2019 2110   ALT 42 09/16/2019 2110   ALKPHOS 109 09/16/2019 2110   BILITOT 0.6 09/16/2019 2110   BILITOT 0.6 08/30/2019 1702   GFRNONAA 33 (L) 09/16/2019 2110   GFRAA 39 (L) 09/16/2019 2110    Imaging I have reviewed the images obtained:  CT-scan of the brain-superimposed on a lot of chronic white matter diseases and area of hypoattenuation in the right frontoparietal lobe consistent with possible infarct.  Unchanged left MCA territory prior infarct.  Assessment: 78 year old with past history of coronary artery disease chronic pain, prior stroke with residual aphasia, atrial fibrillation on anticoagulation with Eliquis in addition to aspirin for intracranial atherosclerosis brought in for concern of worsening left-sided  weakness. Unclear if this weakness is new or has been going on for the past couple of weeks to about 3 months. There has been conflicting reports from the family as she has been in a facility for the last few months. I was unable to speak with any of the family members at this time of the night to confirm. CT is concerning for a right frontoparietal area of hypodensity.  She was noted to be hypotensive and possibly septic from a?  UTI. Her stroke might be likely secondary to hypotension/watershed or cardioembolic given the history of A. fib.   Impression: Right cerebral hemispheric stroke-cardioembolic versus watershed due to hypoperfusion.  Recommendations: Telemetry Frequent neurochecks CTA head and neck now. Consider repeating CT head in 24 hours as she cannot get MRI because of a metallic object around the eye-for better visualization of the stroke. Hemoglobin A1c Lipid panel 2D echocardiogram-to rule out a cardiac source including an infection. Continue Eliquis and aspirin for now-symptoms have probably been going on for a few days-low risk for hemorrhagic transformation.  Also evaluate the whole left upper extremity with an imaging modality such as an x-ray because she has tenderness to palpation all over and has history of falls and says she might of fallen on the left side but her history is unreliable.  Left shoulder/humerus x-ray unremarkable for acute process.  PT OT speech therapy N.p.o. until cleared by stroke swallow screen or formal swallow evaluation.  Stroke team to continue to follow  Discussed the plan over the phone with Dr. Flossie Buffy  -- Amie Portland, MD [facility] Pager: [phone] If 7pm to 7am, please call on call as listed on AMION.

## 2019-09-17 NOTE — Progress Notes (Signed)
  Echocardiogram 2D Echocardiogram with Definity has been performed.  [name] 09/17/2019, 4:36 PM

## 2019-09-17 NOTE — Progress Notes (Addendum)
STROKE TEAM PROGRESS NOTE   INTERVAL HISTORY RN at bedside. Pt lying in bed, disorientated to age and time. Not moving left arm or leg. When touch on the left arm, pt screaming due to pain, seems more on the left wrist and less on the elbow and shoulder. However, she also not able to move left leg, with pain stimulation, slight withdraw of LLE. Moving briskly on the right side. BP still low at 90s, has passed swallow, put on diet. Received multiple NS bolus last night due to low BP concerning for urosepsis. Will put on NS IVF.    Vitals:   09/17/19 0600 09/17/19 0645 09/17/19 0715 09/17/19 0745  BP: 129/79 93/71 105/86 (!) 125/99  Pulse: 87 85    Resp: 20 18 15 16   Temp:      TempSrc:      SpO2: 97% 96%    Weight:      Height:        CBC:  Recent Labs  Lab 09/16/19 2110 09/17/19 0245  WBC 9.8 10.5  HGB 13.3 10.8*  HCT 41.6 34.7*  MCV 83.5 85.3  PLT 366 295    Basic Metabolic Panel:  Recent Labs  Lab 09/16/19 2110 09/17/19 0245  NA 134* 138  K 4.1 3.5  CL 99 113*  CO2 21* 19*  GLUCOSE 192* 108*  BUN 40* 31*  CREATININE 1.48* 1.05*  CALCIUM 9.9 7.9*   Lipid Panel:     Component Value Date/Time   CHOL 92 09/17/2019 0245   CHOL 346 (H) 09/23/2017 1803   TRIG 99 09/17/2019 0245   HDL 31 (L) 09/17/2019 0245   HDL 56 09/23/2017 1803   CHOLHDL 3.0 09/17/2019 0245   VLDL 20 09/17/2019 0245   LDLCALC 41 09/17/2019 0245   LDLCALC Comment 09/23/2017 1803   HgbA1c:  Lab Results  Component Value Date   HGBA1C 9.6 (H) 09/17/2019   Urine Drug Screen:     Component Value Date/Time   LABOPIA POSITIVE (A) 07/02/2018 2208   COCAINSCRNUR NONE DETECTED 07/02/2018 2208   LABBENZ POSITIVE (A) 07/02/2018 2208   AMPHETMU NONE DETECTED 07/02/2018 2208   THCU NONE DETECTED 07/02/2018 2208   LABBARB NONE DETECTED 07/02/2018 2208    Alcohol Level     Component Value Date/Time   ETH <10 05/19/2018 0237    IMAGING Ct Angio Head W Or Wo Contrast  Result Date:  09/17/2019 CLINICAL DATA:  Follow-up examination for acute stroke, left-sided weakness. EXAM: CT ANGIOGRAPHY HEAD AND NECK TECHNIQUE: Multidetector CT imaging of the head and neck was performed using the standard protocol during bolus administration of intravenous contrast. Multiplanar CT image reconstructions and MIPs were obtained to evaluate the vascular anatomy. Carotid stenosis measurements (when applicable) are obtained utilizing NASCET criteria, using the distal internal carotid diameter as the denominator. CONTRAST:  31mL OMNIPAQUE IOHEXOL 350 MG/ML SOLN COMPARISON:  Prior CT from earlier same day as well as previous CTA from 05/19/2018. FINDINGS: CTA NECK FINDINGS Aortic arch: Visualized aortic arch of normal caliber with normal 3 vessel morphology. Moderate atherosclerotic change about the arch and origin of the great vessels without hemodynamically significant stenosis. Extensive atherosclerotic change seen throughout the visualized left subclavian artery without high-grade stenosis. There is a severe near occlusive stenosis involving the mid right subclavian artery, grossly stable from previous (series 7, image 278). Right carotid system: Right CCA patent from its origin to the bifurcation without stenosis. Bulky calcified plaque about the right bifurcation with associated stenosis of up  to 65% by NASCET criteria. Distally, the right ICA is somewhat irregular and attenuated, and diffusely narrowed as compared to the previous CTA, concerning for possible dissection (series 7, images 179-155). Finding is age indeterminate. No frank raised dissection flap visualized. Secondary moderate diffuse narrowing over the affected segment. Right ICA otherwise patent at the skull base. Left carotid system: Mild atheromatous stenosis at the origin of the left CCA. Left CCA otherwise widely patent to the bifurcation without significant stenosis. Bulky calcified plaque at the left bifurcation with associated stenosis of  up to 65% by NASCET criteria. Superimposed small penetrating plaque noted at the proximal left ICA (series 7, image 213). Scattered multifocal centric plaque within the distal left ICA without significant stenosis. Left ICA otherwise patent to the skull base. Vertebral arteries: Both vertebral arteries arise from the subclavian arteries. Right vertebral artery dominant with a diffusely hypoplastic left vertebral artery. Focal plaque at the origin of the right vertebral artery with secondary severe stenosis, grossly stable from previous. Right vertebral otherwise widely patent to the skull base. Hypoplastic left vertebral artery severely diseased, and essentially occludes at the skull base, relatively stable from previous. Skeleton: No acute osseous finding. No discrete osseous lesions. Moderate cervical spondylosis at C4-5 through C6-7. Other neck: No other acute soft tissue abnormality within the neck. Upper chest: Few mildly enlarged right paratracheal and pretracheal nodes measure up to 13 mm, indeterminate, but could be reactive. Small layering bilateral pleural effusions with associated atelectasis. Diffuse interlobular septal thickening compatible with pulmonary interstitial edema. Subcentimeter calcified granuloma noted at the central aspect of the left upper lobe, stable. Review of the MIP images confirms the above findings CTA HEAD FINDINGS Anterior circulation: Petrous left ICA patent. Extensive atheromatous plaque throughout the left carotid siphon with associated moderate diffuse narrowing. A more focal severe stenosis again noted at the supraclinoid left ICA, stable (series 7, image 114). Left ICA terminus perfused. Left M1 irregular but patent without high-grade stenosis. Negative left MCA bifurcation. Extensive small vessel atheromatous irregularity throughout the left MCA branches without large vessel occlusion. Previously seen left M3 occlusion has recanalized. Petrous right ICA patent. Extensive  plaque throughout the right carotid siphon with moderate to severe multifocal narrowing, similar to previous. Right ICA terminus perfused. Right M1 irregular but patent and bifurcates early. Extensive atheromatous irregularity throughout the right MCA branches without proximal occlusion. A1 segments irregular but patent bilaterally. Right A1 hypoplastic. Normal anterior communicating artery. Extensive atheromatous change throughout the left A2 segment with up to moderate multifocal stenoses. Right A2 severely disease and attenuated with multifocal severe stenoses, although does remain patent to its distal aspect. Appearance is stable from previous. 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 Moderate atherosclerotic change throughout the dominant right V4 segment with associated moderate to severe multifocal stenoses, stable. Right PICA patent proximally. Left PICA not visualized. Basilar diminutive with diffuse atheromatous irregularity but remains patent to its distal aspect, unchanged. Superior cerebral arteries patent bilaterally. Both of the posterior cerebral arteries primarily supplied via the basilar. PCAs demonstrate extensive atheromatous irregularity but are patent to their distal aspects. Venous sinuses: Grossly patent allowing for timing of the contrast bolus. Anatomic variants: None significant. Review of the MIP images confirms the above findings IMPRESSION: 1. Negative CTA for large vessel occlusion. 2. Moderate diffuse narrowing of the mid-distal cervical right ICA, new/progressed relative to previous CTA from 2019, and suspicious for possible interval dissection, age indeterminate. No raised dissection flap or significant luminal irregularity. 3.  65% atheromatous stenoses about the carotid bifurcations bilaterally. 4. Severe and extensive atherosclerotic change throughout the intracranial circulation, relatively unchanged from previous. Again, most  notable findings include multifocal severe right A2 and V4 stenoses. 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 5. Short-segment severe proximal right V1 stenosis. 6. Severe near occlusive stenosis at the mid right subclavian artery. 7. Small layering bilateral pleural effusions with associated pulmonary interstitial edema. Electronically Signed   By: Jeannine Boga M.D.   On: 09/17/2019 04:40   Dg Forearm Left  Result Date: 09/17/2019 CLINICAL DATA:  History of prior distal radial fracture, follow-up exam EXAM: LEFT FOREARM - 2 VIEW COMPARISON:  06/29/2019 FINDINGS: Prior distal radial and ulnar fractures are again identified and stable. Intra-articular involvement is noted. No significant callus formation is noted. IMPRESSION: Stable appearance of distal radial and ulnar fractures. Electronically Signed   By: Inez Catalina M.D.   On: 09/17/2019 12:22   Ct Head Wo Contrast  Result Date: 09/17/2019 CLINICAL DATA:  Altered mental status EXAM: CT HEAD WITHOUT CONTRAST TECHNIQUE: Contiguous axial images were obtained from the base of the skull through the vertex without intravenous contrast. COMPARISON:  August 31, 2019 FINDINGS: Brain: Since the prior exam there is a increasing area of hypoattenuation within the right frontoparietal lobe, best seen on series 3, image 23. Again seen are changes of prior left MCA distribution infarct. There is no significant interval progression. There is dilatation the ventricles and sulci consistent with age-related atrophy. Low-attenuation changes in the deep white matter consistent with small vessel ischemia. No extra-axial collections. Vascular: No hyperdense vessel or unexpected calcification. Skull: The skull is intact. No fracture or focal lesion identified. Sinuses/Orbits: The visualized paranasal sinuses and mastoid air cells are clear. The orbits and globes intact. Other: None IMPRESSION: 1. worsening area of hypoattenuation in the right  frontoparietal lobe, consistent evolution of infarct. 2. Stable changes of prior infarct involving the left MCA territory. 3. Findings consistent with age related atrophy and chronic small vessel ischemia 4. These results were called by telephone at the time of interpretation on 09/17/2019 at 1:23 am to provider [facility] , who verbally acknowledged these results. Electronically Signed   By: Prudencio Pair M.D.   On: 09/17/2019 01:24   Ct Angio Neck W Or Wo Contrast  Result Date: 09/17/2019 CLINICAL DATA:  Follow-up examination for acute stroke, left-sided weakness. EXAM: CT ANGIOGRAPHY HEAD AND NECK TECHNIQUE: Multidetector CT imaging of the head and neck was performed using the standard protocol during bolus administration of intravenous contrast. Multiplanar CT image reconstructions and MIPs were obtained to evaluate the vascular anatomy. Carotid stenosis measurements (when applicable) are obtained utilizing NASCET criteria, using the distal internal carotid diameter as the denominator. CONTRAST:  75mL OMNIPAQUE IOHEXOL 350 MG/ML SOLN COMPARISON:  Prior CT from earlier same day as well as previous CTA from 05/19/2018. FINDINGS: CTA NECK FINDINGS Aortic arch: Visualized aortic arch of normal caliber with normal 3 vessel morphology. Moderate atherosclerotic change about the arch and origin of the great vessels without hemodynamically significant stenosis. Extensive atherosclerotic change seen throughout the visualized left subclavian artery without high-grade stenosis. There is a severe near occlusive stenosis involving the mid right subclavian artery, grossly stable from previous (series 7, image 278). Right carotid system: Right CCA patent from its origin to the bifurcation without stenosis. Bulky calcified plaque about the right bifurcation with associated stenosis of up to 65% by NASCET criteria. Distally, the right ICA is somewhat irregular and attenuated,  and diffusely narrowed as compared to the  previous CTA, concerning for possible dissection (series 7, images 179-155). Finding is age indeterminate. No frank raised dissection flap visualized. Secondary moderate diffuse narrowing over the affected segment. Right ICA otherwise patent at the skull base. Left carotid system: Mild atheromatous stenosis at the origin of the left CCA. Left CCA otherwise widely patent to the bifurcation without significant stenosis. Bulky calcified plaque at the left bifurcation with associated stenosis of up to 65% by NASCET criteria. Superimposed small penetrating plaque noted at the proximal left ICA (series 7, image 213). Scattered multifocal centric plaque within the distal left ICA without significant stenosis. Left ICA otherwise patent to the skull base. Vertebral arteries: Both vertebral arteries arise from the subclavian arteries. Right vertebral artery dominant with a diffusely hypoplastic left vertebral artery. Focal plaque at the origin of the right vertebral artery with secondary severe stenosis, grossly stable from previous. Right vertebral otherwise widely patent to the skull base. Hypoplastic left vertebral artery severely diseased, and essentially occludes at the skull base, relatively stable from previous. Skeleton: No acute osseous finding. No discrete osseous lesions. Moderate cervical spondylosis at C4-5 through C6-7. Other neck: No other acute soft tissue abnormality within the neck. Upper chest: Few mildly enlarged right paratracheal and pretracheal nodes measure up to 13 mm, indeterminate, but could be reactive. Small layering bilateral pleural effusions with associated atelectasis. Diffuse interlobular septal thickening compatible with pulmonary interstitial edema. Subcentimeter calcified granuloma noted at the central aspect of the left upper lobe, stable. Review of the MIP images confirms the above findings CTA HEAD FINDINGS Anterior circulation: Petrous left ICA patent. Extensive atheromatous plaque  throughout the left carotid siphon with associated moderate diffuse narrowing. A more focal severe stenosis again noted at the supraclinoid left ICA, stable (series 7, image 114). Left ICA terminus perfused. Left M1 irregular but patent without high-grade stenosis. Negative left MCA bifurcation. Extensive small vessel atheromatous irregularity throughout the left MCA branches without large vessel occlusion. Previously seen left M3 occlusion has recanalized. Petrous right ICA patent. Extensive plaque throughout the right carotid siphon with moderate to severe multifocal narrowing, similar to previous. Right ICA terminus perfused. Right M1 irregular but patent and bifurcates early. Extensive atheromatous irregularity throughout the right MCA branches without proximal occlusion. A1 segments irregular but patent bilaterally. Right A1 hypoplastic. Normal anterior communicating artery. Extensive atheromatous change throughout the left A2 segment with up to moderate multifocal stenoses. Right A2 severely disease and attenuated with multifocal severe stenoses, although does remain patent to its distal aspect. Appearance is stable from previous. 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 Moderate atherosclerotic change throughout the dominant right V4 segment with associated moderate to severe multifocal stenoses, stable. Right PICA patent proximally. Left PICA not visualized. Basilar diminutive with diffuse atheromatous irregularity but remains patent to its distal aspect, unchanged. Superior cerebral arteries patent bilaterally. Both of the posterior cerebral arteries primarily supplied via the basilar. PCAs demonstrate extensive atheromatous irregularity but are patent to their distal aspects. Venous sinuses: Grossly patent allowing for timing of the contrast bolus. Anatomic variants: None significant. Review of the MIP images confirms the above findings IMPRESSION: 1.  Negative CTA for large vessel occlusion. 2. Moderate diffuse narrowing of the mid-distal cervical right ICA, new/progressed relative to previous CTA from 2019, and suspicious for possible interval dissection, age indeterminate. No raised dissection flap or significant luminal irregularity. 3. 65% atheromatous stenoses about the carotid bifurcations bilaterally. 4. Severe and extensive atherosclerotic  change throughout the intracranial circulation, relatively unchanged from previous. Again, most notable findings include multifocal severe right A2 and V4 stenoses. 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 5. Short-segment severe proximal right V1 stenosis. 6. Severe near occlusive stenosis at the mid right subclavian artery. 7. Small layering bilateral pleural effusions with associated pulmonary interstitial edema. Electronically Signed   By: Rise Mu M.D.   On: 09/17/2019 04:40   Dg Hand 2 View Left  Result Date: 09/17/2019 CLINICAL DATA:  Pt brought in for multiple falls and left-sided weakness. AMS. Pt states hx of broken wrist APPROX.1 month ago. Pt c/o pain on entire left side. Pt unable to move arm at all, limited ROM with external rotation. Screaming with every touch. EXAM: The COMPARISON:  None. FINDINGS: Lucency through the distal radial metaphysis with disruption of the articular surface consistent with incomplete healing of fracture demonstrated on radiograph 06/29/2019. Fracture is intra-articular. No acute fracture in the wrist. Degenerative change at the first carpometacarpal joint. IMPRESSION: Incomplete healing of distal radial fracture. Electronically Signed   By: Genevive Bi M.D.   On: 09/17/2019 12:22   Dg Chest Port 1 View  Result Date: 09/16/2019 CLINICAL DATA:  Hypotension EXAM: PORTABLE CHEST 1 VIEW COMPARISON:  08/31/2019 FINDINGS: Mild cardiomegaly with aortic atherosclerosis. Streaky atelectasis at the bases. No consolidation, pleural effusion,  or pneumothorax. IMPRESSION: 1. Mild cardiomegaly with minimal basilar atelectasis. Electronically Signed   By: Jasmine Pang M.D.   On: 09/16/2019 22:52   Dg Humerus Left  Result Date: 09/17/2019 CLINICAL DATA:  Left arm pain. EXAM: LEFT HUMERUS - 2+ VIEW COMPARISON:  Shoulder radiograph 06/04/2019 FINDINGS: Cortical margins of the humerus are intact. Humeral head osteophytes with advanced glenohumeral osteoarthritis demonstrated on prior shoulder exam. Cortical margins of the humerus are intact. There is no evidence of fracture or other focal bone lesions. Soft tissues are unremarkable. IMPRESSION: No acute findings. Advanced shoulder osteoarthritis, demonstrated on prior shoulder exam. Electronically Signed   By: Narda Rutherford M.D.   On: 09/17/2019 00:42    PHYSICAL EXAM  Temp:  [97.9 F (36.6 C)-98.7 F (37.1 C)] 98 F (36.7 C) (10/23 1453) Pulse Rate:  [55-109] 103 (10/23 1453) Resp:  [11-25] 15 (10/23 1453) BP: (80-133)/(61-115) 96/75 (10/23 1453) SpO2:  [96 %-100 %] 100 % (10/23 1453) Weight:  [77 kg] 77 kg (10/23 0200)  General - Well nourished, well developed, in pain with left arm.  Ophthalmologic - fundi not visualized due to noncooperation.  Cardiovascular - irregularly irregular heart rate and rhythm.  Mental Status -  Level of arousal and orientation to name, year, person were intact, not orientated to place, month. Language including expression, naming, repetition, comprehension was assessed and found intact, mild dysarthria.  Cranial Nerves II - XII - II - Visual field intact OU, but seems to have left simultagnosia. III, IV, VI - Extraocular movements intact. V - Facial sensation intact bilaterally. VII - left nasolabial fold flattenting. VIII - Hearing & vestibular intact bilaterally. X - Palate elevates symmetrically. XI - Chin turning intact bilaterally but shoulder shrug not able to perform on the left side. XII - Tongue protrusion intact.  Motor  Strength - The patient's strength was 4/5 RUE and RLE, however refuse to move or touch on the left UE due to severe pain more at wrist and less at elbow and shoulder, LLE 1/5 with slight withdraw on pain. Bulk was normal and fasciculations were absent.   Motor Tone - Muscle tone  was assessed at the neck and appendages except LUE and was normal.  Reflexes - The patient's reflexes were symmetrical in all extremities except not able to check on LUE and she had babinski on the left.  Sensory - Light touch, temperature/pinprick were assessed and were symmetrical subjectively.    Coordination - The patient had normal movements in the right hand with no ataxia or dysmetria.  Tremor was absent.  Gait and Station - deferred.   ASSESSMENT/PLAN Ms. [name] is a 78 y.o. female with history of CAD, AF on AC, chronic pain on narcotics, prior stroke w/ residual aphasia on aspirin for intracranial atherosclerosis presenting with L sided weakness along with speech changes more than basleine.   Stroke:  Most likely right frontal infarct secondary to progressive right ICA stenosis in the setting of urosepsis  CT head worsening hypoattenuation R frontoparietal lobe c/w infarct evolution. Old L MCA territory infarct. Small vessel disease. Atrophy.   CTA head & neck no LVO. Moderate diffuse narrowing mid-distal cervical R ICA new since 2019 and suspicious for interval dissection. B ICA bifurcation 65% stenosis. Severe and extensive intracranial atherosclerosis (multifocal severe R A2 and V4, hypoplastic L VA occluded). Severe proximal R V1 stenosis. Near occlusive stenosis R subclavian.   MRI  (unable to perform d/t foreign metal around eye)  Repeat CT head pending  2D Echo pending  LDL 41  HgbA1c 9.6  Eliquis for VTE prophylaxis  aspirin 81 mg daily and Eliquis (apixaban) daily prior to admission, now on aspirin 81 mg daily and Eliquis (apixaban) daily.   Therapy recommendations:  pending    Disposition:  pending   Vascular stenosis  04/2018 CTA head and neck - L M3 occlusion, b/l ICA bifurcation, L ICA siphon, R A2, R V4, L V3, R V1 and mid R subclavian.   This admission, CTA head & neck no LVO. Moderate diffuse narrowing mid-distal cervical R ICA new since 2019 and suspicious for interval dissection. B ICA bifurcation 65% stenosis. Severe and extensive intracranial atherosclerosis (multifocal severe R A2 and V4, hypoplastic L VA occluded). Severe proximal R V1 stenosis. Near occlusive stenosis R subclavian.  On eliquis and ASA 81  BP goal 130-150 given severe stenosis  Avoid low BP  Urosepsis  BP low on admission   Received multiple IV bolus  BP improved and then low again  Put on NS IVF @ 75  Encourage po intake  On cefepime, vanco and flagyl  Blood culture pending  Urine culture pending - UA WBC > 50  Treatment per primary team  Hx stroke/TIA  06/2018 admitted for tachycardia and found to have AF w/ RVR and LA thrombus. Put on eliquis and ASA 81  04/2018 L brain infarct. Found significant intra and extracranial atherosclerosis. L M3 occlusion felt to be culprit lesion. Put on DAPT but did not complete course.  Atrial Fibrillation  06/2018 admitted for tachycardia and found to have AF w/ RVR and LA thrombus. Put on eliquis.  Home anticoagulation:  Eliquis (apixaban) daily, continued in the hospital  BB on hold to allow for permissive HTN . Continue Eliquis (apixaban) daily at discharge   Hypotension Hx Hypertension  Low BP likely d/t sepsis . Permissive hypertension (OK if < 220/120) but gradually normalize in 5-7 days . Monitor  . Long-term BP goal normotensive  Left arm pain  Recent fall  X-ray showed stable distal radial and ulnar fractures.   Pain management per primary team  Hyperlipidemia  Home meds:  lipitor 40, resumed in hospital  LDL 41, goal < 70  Continue statin at discharge  Diabetes type II Uncontrolled  HgbA1c  9.6, goal < 7.0  CBGs  SSI  Close PCP follow up for better DM control  DM coordinator consult can be considered  Other Stroke Risk Factors  Advanced age  Hx narcotic addiction.   Obesity, Body mass index is 30.07 kg/m., recommend weight loss, diet and exercise as appropriate   Coronary artery disease s/p PCI, stent  Chronic diastolic CHF  Hx LV thrombus  Other Active Problems  Hypothyroidism  Anxiety   Chronic issues w/ somnolence, sleep issues, Hx Malachi Carl (followed by Dr. Vickey Huger as an OP)  Hx Bell's Palsy w/ resultant R facial weakness  Chronic back pain   Hospital day # 0  I spent  35 minutes in total face-to-face time with the patient, more than 50% of which was spent in counseling and coordination of care, reviewing test results, images and medication, and discussing the diagnosis of stroke, cerebral multi-vessel severe athero, urosepsis, treatment plan and potential prognosis. This patient's care requiresreview of multiple databases, neurological assessment, discussion with family, other specialists and medical decision making of high complexity.  Marvel Plan, MD PhD Stroke Neurology 09/17/2019 4:08 PM      To contact Stroke Continuity provider, please refer to [email or web address]. After hours, contact General Neurology

## 2019-09-17 NOTE — ED Notes (Signed)
Lunch Tray Ordered @ 1143. 

## 2019-09-17 NOTE — ED Notes (Signed)
Patient denies pain and is resting comfortably.  

## 2019-09-17 NOTE — H&P (Addendum)
History and Physical    [name] GHW:[id number] DOB: [birth year] DOA: 09/16/2019  PCP: Horald Pollen, MD  Patient coming from: [place]   I have personally briefly reviewed patient's old medical records in [facility]  Chief Complaint: left sided weakness  HPI: [name] is a 78 y.o. 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  Patient unable to provide history and does not know why she is in the hospital.  No family at bedside. Per EDP report, daughter noticed that about a week ago she seemed to have left-sided neglect and some slurred speech.  Facility reportedly does not feel that she has any deviations from her baseline but requested ED eval due to family concerns.  Patient denies any frank pain but was intolerant of having her left shoulder examined or be turned over for respiratory exam.  She was recently evaluated in the ED last week for a fall.  ED Course:  She was afebrile and hypotensive down to 80s over 60 which improved after 3 L of fluid resuscitation up to 150s over 90.  CBC showed no leukocytosis or anemia.  CMP showed mildly elevated creatinine of 1.48 from a prior of 1.09.  Lactate of 2.2.  Urinalysis shows large leukocyte, negative nitrite and many WBC.  Review of Systems:  Unable to fully obtain due to cognitive impairment but patient denies any frank pain.  Past Medical History:  Diagnosis Date  . Abdominal discomfort    "due to medication intolerance"  . CAD (coronary artery disease)    previous stent  . Chronic pain syndrome 09/10/2013   Dr Nelva Bush,   . [facility] Patient virus infection 1988  . Excessive daytime sleepiness 12/05/2014   Patient has not been seen for a sleep  study as ordered, and used adderall to keep alert in daytime.  She agreed  In a contract not to have any scheduled medication for pain treatment from [facility] and will not receive refills for Adderall, which was initiated by dr Orland Penman, PCP>   . Fall   . Hyperlipemia   . Hypersomnia, persistent 09/10/2013   Patient on  Stimulants .  Marland Kitchen Hypertension   . Narcotic addiction ([place]) 12/05/2014  . Obesity, unspecified 09/10/2013  . Shoulder joint pain    both shoulders  . Stroke [facility])     Past Surgical History:  Procedure Laterality Date  . ANGIOPLASTY  03/01/2002   cutting balloon mid RCA  . BACK SURGERY    . CARDIAC CATHETERIZATION  01/26/2007   mild diffuse CAD  . CARDIAC CATHETERIZATION  10/23/2009   nonobstructive CAD,60% prox RCA,50% prox LAD, 50% mid ramus  . CORONARY STENT PLACEMENT  03/15/2005   distal RCA  . NEPHRECTOMY    . TEE WITHOUT CARDIOVERSION N/A 07/03/2018   Procedure: TRANSESOPHAGEAL ECHOCARDIOGRAM (TEE);  Surgeon: Skeet Latch, MD;  Location: [facility];  Service: Cardiovascular;  Laterality: N/A;     reports that she has never smoked. She has never used smokeless tobacco. She reports that she does not drink alcohol or use drugs.  Allergies  Allergen Reactions  . Azithromycin Nausea And Vomiting  . Cardizem [Diltiazem Hcl] Nausea And Vomiting  . Codeine Other (See Comments)    Made the patient "feel weird"   . Coreg [Carvedilol]  Nausea And Vomiting  . Demerol [Meperidine] Other (See Comments)    Disorientation   . Erythromycin Nausea And Vomiting and Other (See Comments)    Made the patient "very sick"  . Inderal [Propranolol] Other (See Comments)    "Made me cry"  . Procardia [Nifedipine] Other (See Comments)    "Disoriented and sick"  . Wellbutrin [Bupropion] Nausea Only  . Zoloft [Sertraline Hcl] Other (See Comments)    Could not talk, made the patient stiff    Family History  Problem Relation Age of Onset  . Hypertension Mother   . Diabetes Father   .  Heart attack Father   . Heart attack Brother   . Heart attack Brother    Family history reviewed and not pertinent   Prior to Admission medications   Medication Sig Start Date End Date Taking? Authorizing Provider  ACCU-CHEK SOFTCLIX LANCETS lancets USE TO TEST FOUR TIMES DAILY Patient taking differently: 1 each by Other route See admin instructions. Use as directed to test blood sugar four times daily. 04/29/18  Yes McVey, Gelene Mink, PA-C  aspirin EC 81 MG EC tablet Take 1 tablet (81 mg total) by mouth daily. 07/06/18  Yes Hongalgi, Lenis Dickinson, MD  atorvastatin (LIPITOR) 40 MG tablet TAKE 1 TABLET BY MOUTH ONCE DAILY AT  6PM Patient taking differently: Take 40 mg by mouth daily at 6 PM.  06/18/19  Yes Rutherford Guys, MD  blood glucose meter kit and supplies Dispense based on patient and insurance preference. Use up to four times daily as directed. (FOR ICD-9 250.00, 250.01). Patient taking differently: 1 each by Other route See admin instructions. Use up to four times daily as directed. (FOR ICD-9 250.00, 250.01). 09/27/17  Yes Oswald Hillock, MD  Cholecalciferol (VITAMIN D3) 5000 units TBDP Take 5,000 Units by mouth daily.    Yes [provider]  Chromium 200 MCG TABS Take 200 mcg by mouth daily.   Yes [provider]  DHA-EPA-Coenzyme Q10-Vitamin E (GNP COQ-10 & FISH OIL) 120-180-50-30 CAPS Take 2 capsules by mouth daily.    Yes [provider]  ELIQUIS 5 MG TABS tablet Take 1 tablet by mouth twice daily Patient taking differently: Take 5 mg by mouth 2 (two) times daily.  06/21/19  Yes Croitoru, Mihai, MD  fexofenadine (ALLEGRA) 180 MG tablet Take 180 mg by mouth daily as needed for allergies.    Yes [provider]  glucose blood (ACCU-CHEK AVIVA PLUS) test strip USE TO TEST FOUR TIMES DAILY Patient taking differently: 1 each by Other route See admin instructions. Use as directed to test four times daily. 12/02/17  Yes McVey, Gelene Mink, PA-C   hydrochlorothiazide (HYDRODIURIL) 25 MG tablet Take 25 mg by mouth daily.   Yes [provider]  HYDROcodone-acetaminophen (NORCO) 10-325 MG per tablet Take 1-2 tablets by mouth every 4 (four) hours as needed for moderate pain.  09/28/14  Yes [provider]  ibuprofen (ADVIL) 200 MG tablet Take 400-600 mg by mouth every 4 (four) hours as needed for moderate pain.    Yes [provider]  Insulin Syringe-Needle U-100 (INSULIN SYRINGE .5CC/31GX5/16") 31G X 5/16" 0.5 ML MISC Use as directed Patient taking differently: 1 application by Other route See admin instructions. Use as directed 09/27/17  Yes Lama, Marge Duncans, MD  LANTUS SOLOSTAR 100 UNIT/ML Solostar Pen Inject 30 Units into the skin at bedtime.  05/09/19  Yes [provider]  levothyroxine (SYNTHROID) 50 MCG tablet TAKE 1 TABLET  BY MOUTH ONCE DAILY BEFORE BREAKFAST Patient taking differently: Take 50 mcg by mouth daily before breakfast.  08/25/19  Yes Sagardia, Ines Bloomer, MD  lisinopril (ZESTRIL) 20 MG tablet TAKE 1 TABLET BY MOUTH IN THE MORNING AND 1/2 (ONE-HALF) IN THE EVENING Patient taking differently: Take 10 mg by mouth daily.  06/14/19  Yes Croitoru, Mihai, MD  magnesium oxide (MAG-OX) 400 MG tablet Take 800 mg by mouth 2 (two) times daily.    Yes [provider]  metoprolol tartrate (LOPRESSOR) 50 MG tablet Take 1 tablet by mouth twice daily Patient taking differently: Take 50 mg by mouth 2 (two) times daily.  07/11/19  Yes Sagardia, Ines Bloomer, MD  Misc. Devices (HUGO ROLLING WALKER ELITE) MISC 1 Units by Does not apply route daily as needed. Patient taking differently: 1 Units by Other route daily as needed.  11/29/16  Yes McVey, Gelene Mink, PA-C  potassium chloride (KLOR-CON) 10 MEQ tablet TAKE 1  BY MOUTH ONCE DAILY Patient taking differently: Take 10 mEq by mouth daily.  08/25/19  Yes Horald Pollen, MD  venlafaxine XR (EFFEXOR XR) 150 MG 24 hr capsule Take 1 capsule (150 mg  total) by mouth daily with breakfast. 09/30/18  Yes McVey, Gelene Mink, PA-C    Physical Exam: Vitals:   09/17/19 0130 09/17/19 0145 09/17/19 0200 09/17/19 0200  BP: (!) 133/115 113/85    Pulse: 92  94   Resp: _0 Temp:      TempSrc:      SpO2: 99%  98%   Weight:    77 kg  Height:    _1  (1.6 m)    Constitutional: NAD, calm, comfortable, laying flat in bed Vitals:   09/17/19 0130 09/17/19 0145 09/17/19 0200 09/17/19 0200  BP: (!) 133/115 113/85    Pulse: 92  94   Resp: _2 Temp:      TempSrc:      SpO2: 99%  98%   Weight:    77 kg  Height:    _3  (1.6 m)   Eyes: PERRL, lids and conjunctivae normal ENMT: Mucous membranes are moist. Posterior pharynx clear of any exudate or lesions. Neck: normal, supple, no masses. Respiratory: clear to auscultation anteriorly (patient did not want to sit up or roll over for respiratory exam) no wheezing, no crackles. Normal respiratory effort on room air. No accessory muscle use.  Cardiovascular: Regular rate and rhythm, no murmurs / rubs / gallops. +3 lower extremity edema. 2+ pedal pulses. No carotid bruits.  Abdomen: no tenderness, no masses palpated.  Bowel sounds positive.  Musculoskeletal: no clubbing / cyanosis. No joint deformity upper and lower extremities. Good ROM, no contractures. Normal muscle tone.  Skin: no rashes, lesions, ulcers. No induration Neurologic: CN 2-12 grossly intact. Sensation intact. Strength 4/5 in lower extremity.  Patient was not able to lift left upper extremity or left hand grip-however she reports this due to pain from her left shoulder s/p recent fall. Had difficulty following simple commands.  Had long pauses when attempting to answer questions. Psychiatric: Normal judgment and insight. Alert and oriented x 3. Normal mood.     Labs on Admission: I have personally reviewed following labs and imaging studies  CBC: Recent Labs  Lab 09/16/19 2110  WBC 9.8  HGB 13.3  HCT 41.6   MCV 83.5  PLT 417   Basic Metabolic Panel: Recent Labs  Lab 09/16/19 2110  NA 134*  K 4.1  CL 99  CO2 21*  GLUCOSE 192*  BUN 40*  CREATININE 1.48*  CALCIUM 9.9   GFR: Estimated Creatinine Clearance: 30.3 mL/min (A) (by C-G formula based on SCr of 1.48 mg/dL (H)). Liver Function Tests: Recent Labs  Lab 09/16/19 2110  AST 41  ALT 42  ALKPHOS 109  BILITOT 0.6  PROT 7.0  ALBUMIN 3.5   No results for input(s): LIPASE, AMYLASE in the last 168 hours. No results for input(s): AMMONIA in the last 168 hours. Coagulation Profile: Recent Labs  Lab 09/16/19 2300  INR 1.3*   Cardiac Enzymes: No results for input(s): CKTOTAL, CKMB, CKMBINDEX, TROPONINI in the last 168 hours. BNP (last 3 results) No results for input(s): PROBNP in the last 8760 hours. HbA1C: No results for input(s): HGBA1C in the last 72 hours. CBG: Recent Labs  Lab 09/16/19 2108  GLUCAP 187*   Lipid Profile: No results for input(s): CHOL, HDL, LDLCALC, TRIG, CHOLHDL, LDLDIRECT in the last 72 hours. Thyroid Function Tests: No results for input(s): TSH, T4TOTAL, FREET4, T3FREE, THYROIDAB in the last 72 hours. Anemia Panel: No results for input(s): VITAMINB12, FOLATE, FERRITIN, TIBC, IRON, RETICCTPCT in the last 72 hours. Urine analysis:    Component Value Date/Time   COLORURINE YELLOW 09/17/2019 0108   APPEARANCEUR HAZY (A) 09/17/2019 0108   LABSPEC 1.014 09/17/2019 0108   PHURINE 5.0 09/17/2019 0108   GLUCOSEU NEGATIVE 09/17/2019 0108   HGBUR LARGE (A) 09/17/2019 0108   BILIRUBINUR NEGATIVE 09/17/2019 0108   KETONESUR NEGATIVE 09/17/2019 0108   PROTEINUR NEGATIVE 09/17/2019 0108   UROBILINOGEN 0.2 07/15/2013 1819   NITRITE NEGATIVE 09/17/2019 0108   LEUKOCYTESUR LARGE (A) 09/17/2019 0108    Radiological Exams on Admission: Ct Head Wo Contrast  Result Date: 09/17/2019 CLINICAL DATA:  Altered mental status EXAM: CT HEAD WITHOUT CONTRAST TECHNIQUE: Contiguous axial images were obtained from  the base of the skull through the vertex without intravenous contrast. COMPARISON:  August 31, 2019 FINDINGS: Brain: Since the prior exam there is a increasing area of hypoattenuation within the right frontoparietal lobe, best seen on series 3, image 23. Again seen are changes of prior left MCA distribution infarct. There is no significant interval progression. There is dilatation the ventricles and sulci consistent with age-related atrophy. Low-attenuation changes in the deep white matter consistent with small vessel ischemia. No extra-axial collections. Vascular: No hyperdense vessel or unexpected calcification. Skull: The skull is intact. No fracture or focal lesion identified. Sinuses/Orbits: The visualized paranasal sinuses and mastoid air cells are clear. The orbits and globes intact. Other: None IMPRESSION: 1. worsening area of hypoattenuation in the right frontoparietal lobe, consistent evolution of infarct. 2. Stable changes of prior infarct involving the left MCA territory. 3. Findings consistent with age related atrophy and chronic small vessel ischemia 4. These results were called by telephone at the time of interpretation on 09/17/2019 at 1:23 am to provider [facility] , who verbally acknowledged these results. Electronically Signed   By: Prudencio Pair M.D.   On: 09/17/2019 01:24   Dg Chest Port 1 View  Result Date: 09/16/2019 CLINICAL DATA:  Hypotension EXAM: PORTABLE CHEST 1 VIEW COMPARISON:  08/31/2019 FINDINGS: Mild cardiomegaly with aortic atherosclerosis. Streaky atelectasis at the bases. No consolidation, pleural effusion, or pneumothorax. IMPRESSION: 1. Mild cardiomegaly with minimal basilar atelectasis. Electronically Signed   By: Donavan Foil M.D.   On: 09/16/2019 22:52   Dg Humerus Left  Result Date: 09/17/2019 CLINICAL DATA:  Left arm pain. EXAM: LEFT HUMERUS - 2+ VIEW  COMPARISON:  Shoulder radiograph 06/04/2019 FINDINGS: Cortical margins of the humerus are intact. Humeral  head osteophytes with advanced glenohumeral osteoarthritis demonstrated on prior shoulder exam. Cortical margins of the humerus are intact. There is no evidence of fracture or other focal bone lesions. Soft tissues are unremarkable. IMPRESSION: No acute findings. Advanced shoulder osteoarthritis, demonstrated on prior shoulder exam. Electronically Signed   By: Keith Rake M.D.   On: 09/17/2019 00:42    EKG: Independently reviewed.   Assessment/Plan  Right frontoparietal lobe infarct - Discussed CT finding with Neurology Dr. Earnest Bailey - believes new stroke likely sometime between last CT head on 10/6 and this admission.  - Pt unable to get MRI brain due to foreign metal object in the past  - will obtain CTA head and neck - echocardiogram  - continue Eliquis and aspirin  -Obtain A1c and lipids -PT/OT/SLT -Frequent neuro checks and keep on telemetry -Allow for permissive hypertension with blood pressure treatment as needed only if systolic goes above 460  Left sided weakness - multifactorial from new right sided infarct and recent fall -pt intolerant of movement to left upper extremity due to left shoulder pain - Left shoulder X-ray negative.  Sepsis - Unclear source but could be due to UTI given UA shows positive leukocytes and many WBC -  Lactate of 2.2 on admission. Tachycardiac up to 110.  - Started on broad-spectrum antibiotics with vancomycin and cefepime.  Will keep her on this pending blood and urine cultures before de-escalating. -Continue to trend lactate  Hypotension likely secondary to sepsis  - improved with 3L fluid resuscitation  -Continue to closely monitor to avoid any further hypotension especially in the setting of new infarct -hold antihypertensives  Atrial fibrillation -Currently rate controlled. -Continue Eliquis -Hold beta-blocker for permissive hypertension in the setting of new infarct  Type 2 diabetes -Hemoglobin A1c of 11.5 few weeks ago -Normally on  30 units of Lantus at bedtime - start on 20 units Lantus qHS and moderate SSI  Chronic diastolic CHF -Stable.  Patient is hypovolemic with improved blood pressure s/p fluid resuscitation.  Hypothyroidism -Continue levothyroxine  Anxiety - continue Effexor  DVT prophylaxis: Eliquis Code Status:Full Family Communication: Plan discussed with patient at bedside  disposition Plan: Home with at least 2 midnight stays  Consults called: Neurology Admission status: inpatient    Wilberth Damon T Vincy Feliz DO [facility]   If 7PM-7AM, please contact night-coverage [email or web address] Password [email or web address]  09/17/2019, 2:32 AM

## 2019-09-17 NOTE — ED Notes (Signed)
Pt's brief and sheets saturated in urine; linens and gown changed. Purewick placed as pt felt that she could try to urinate. Pt repositioned in bed

## 2019-09-17 NOTE — ED Notes (Signed)
Spoke with MD RE fact that 20 meq k given when 10 meq was ordered.  He is aware and feels no further action is needed at this time.  He states not to perform orthostatics d/t pt pain to entire L side.

## 2019-09-17 NOTE — ED Notes (Signed)
PT aware of need for UA. PT aware I&O will be alternative

## 2019-09-18 ENCOUNTER — Inpatient Hospital Stay (HOSPITAL_COMMUNITY): Payer: Medicare Other

## 2019-09-18 DIAGNOSIS — R531 Weakness: Secondary | ICD-10-CM | POA: Diagnosis not present

## 2019-09-18 LAB — GLUCOSE, CAPILLARY
Glucose-Capillary: 126 mg/dL — ABNORMAL HIGH (ref 70–99)
Glucose-Capillary: 209 mg/dL — ABNORMAL HIGH (ref 70–99)
Glucose-Capillary: 224 mg/dL — ABNORMAL HIGH (ref 70–99)
Glucose-Capillary: 209 mg/dL — ABNORMAL HIGH (ref 70–99)

## 2019-09-18 LAB — COMPREHENSIVE METABOLIC PANEL
ALT: 27 U/L (ref 0–44)
AST: 24 U/L (ref 15–41)
Albumin: 2.8 g/dL — ABNORMAL LOW (ref 3.5–5.0)
Alkaline Phosphatase: 87 U/L (ref 38–126)
Anion gap: 9 (ref 5–15)
BUN: 19 mg/dL (ref 8–23)
CO2: 20 mmol/L — ABNORMAL LOW (ref 22–32)
Calcium: 9.4 mg/dL (ref 8.9–10.3)
Chloride: 110 mmol/L (ref 98–111)
Creatinine, Ser: 1.08 mg/dL — ABNORMAL HIGH (ref 0.44–1.00)
GFR calc Af Amer: 57 mL/min — ABNORMAL LOW (ref >60)
GFR calc non Af Amer: 49 mL/min — ABNORMAL LOW (ref >60)
Glucose, Bld: 140 mg/dL — ABNORMAL HIGH (ref 70–99)
Potassium: 3.5 mmol/L (ref 3.5–5.1)
Sodium: 139 mmol/L (ref 135–145)
Total Bilirubin: 0.6 mg/dL (ref 0.3–1.2)
Total Protein: 6 g/dL — ABNORMAL LOW (ref 6.5–8.1)

## 2019-09-18 LAB — CBC
HCT: 33 % — ABNORMAL LOW (ref 36.0–46.0)
Hemoglobin: 10.9 g/dL — ABNORMAL LOW (ref 12.0–15.0)
MCH: 27 pg (ref 26.0–34.0)
MCHC: 33 g/dL (ref 30.0–36.0)
MCV: 81.7 fL (ref 80.0–100.0)
Platelets: 294 10*3/uL (ref 150–400)
RBC: 4.04 MIL/uL (ref 3.87–5.11)
RDW: 14.4 % (ref 11.5–15.5)
WBC: 7.6 10*3/uL (ref 4.0–10.5)
nRBC: 0 % (ref 0.0–0.2)

## 2019-09-18 LAB — URINE CULTURE: Culture: <10000 — AB

## 2019-09-18 LAB — MAGNESIUM: Magnesium: 1.8 mg/dL (ref 1.7–2.4)

## 2019-09-18 MED ORDER — KETOROLAC TROMETHAMINE 30 MG/ML IJ SOLN
15.0000 mg | Freq: Once | INTRAMUSCULAR | Status: AC
  Administered 2019-09-18: 15 mg via INTRAVENOUS
  Filled 2019-09-18: qty 1

## 2019-09-18 MED ORDER — MORPHINE SULFATE (PF) 2 MG/ML IV SOLN
1.0000 mg | Freq: Once | INTRAVENOUS | Status: AC | PRN
  Administered 2019-09-18: 1 mg via INTRAVENOUS
  Filled 2019-09-18: qty 1

## 2019-09-18 MED ORDER — SODIUM CHLORIDE 0.9 % IV BOLUS
500.0000 mL | Freq: Once | INTRAVENOUS | Status: AC
  Administered 2019-09-18: 04:00:00 500 mL via INTRAVENOUS

## 2019-09-18 MED ORDER — ORAL CARE MOUTH RINSE
15.0000 mL | Freq: Two times a day (BID) | OROMUCOSAL | Status: DC
  Administered 2019-09-18 – 2019-09-28 (×20): 15 mL via OROMUCOSAL

## 2019-09-18 MED ORDER — OXYCODONE HCL 5 MG PO TABS
5.0000 mg | ORAL_TABLET | ORAL | Status: DC | PRN
  Administered 2019-09-18 – 2019-09-24 (×20): 5 mg via ORAL
  Filled 2019-09-18 (×20): qty 1

## 2019-09-18 MED ORDER — POLYVINYL ALCOHOL 1.4 % OP SOLN
1.0000 [drp] | Freq: Four times a day (QID) | OPHTHALMIC | Status: DC
  Administered 2019-09-18 – 2019-09-28 (×41): 1 [drp] via OPHTHALMIC
  Filled 2019-09-18: qty 15

## 2019-09-18 MED ORDER — SODIUM CHLORIDE 0.9 % IV SOLN
2.0000 g | INTRAVENOUS | Status: DC
  Administered 2019-09-19: 09:00:00 2 g via INTRAVENOUS
  Filled 2019-09-18: qty 2

## 2019-09-18 NOTE — Evaluation (Signed)
Physical Therapy Evaluation Patient Details Name: [name] MRN: [id number] DOB: Oct 29, 1941 Today's Date: 09/18/2019   History of Present Illness  [name] is a 78 y.o. 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 Of note, pt with R shoulder pain from recent fall. MRI revealed R frontoparietal CVA L arm pain from stable distal radial and ulnar fxs.   Clinical Impression  Pt admitted with above diagnosis. Pt with L inattention and weakness L UE and LE as well as pain BUE's, L>R. Pt needed max A +2 for bed mobility. Attempted standing multiple times and pt could achieve partial stand with +2 max A, L knee buckling and pt unable to step feet. BP taken in supine and sitting, neg for orthostasis. Could not maintain standing long enough for BP.  Pt currently with functional limitations due to the deficits listed below (see PT Problem List). Pt will benefit from skilled PT to increase their independence and safety with mobility to allow discharge to the venue listed below.       Follow Up Recommendations SNF;Supervision/Assistance - 24 hour    Equipment Recommendations  None recommended by PT    Recommendations for Other Services       Precautions / Restrictions Precautions Precautions: Fall Restrictions Weight Bearing Restrictions: No Other Position/Activity Restrictions: Pt with stable LUE ulnar and radial fx      Mobility  Bed Mobility Overal bed mobility: Needs Assistance Bed Mobility: Supine to Sit;Sit to Supine;Rolling Rolling: Max assist;+2 for physical assistance   Supine to sit: Max assist;+2 for physical assistance Sit to supine: Max assist;+2 for physical assistance   General bed mobility  comments: maxA+2 for trunk elevation and BLE movement off of bed. Pt requiring maxA to scoot EOB.  Transfers Overall transfer level: Needs assistance Equipment used: 2 person hand held assist Transfers: Sit to/from Stand Sit to Stand: Max assist;+2 safety/equipment;+2 physical assistance         General transfer comment: Unsuccesful with RW; MaxA+2 HHA, 2 standing attempts and pt unable to weight shift to take steps toward [facility] or achieve full upright posture  Ambulation/Gait             General Gait Details: unable  Stairs            Wheelchair Mobility    Modified Rankin (Stroke Patients Only) Modified Rankin (Stroke Patients Only) Pre-Morbid Rankin Score: Moderately severe disability Modified Rankin: Severe disability     Balance Overall balance assessment: Needs assistance Sitting-balance support: No upper extremity supported;Feet supported Sitting balance-Leahy Scale: Poor Sitting balance - Comments: Pt supervisionA to minA overall as pt could self correct when leaning to L side with verbal and tactile cues to obtain upright sitting.  Pt with poor balance for dynamic reaching task with RUE. Postural control: Left lateral lean Standing balance support: Bilateral upper extremity supported Standing balance-Leahy Scale: Zero Standing balance comment: max A +2 to maintain standing position                             Pertinent Vitals/Pain Pain Assessment: Faces Faces Pain Scale: Hurts even more Pain Location: L arm elbow through hand Pain Descriptors / Indicators: Grimacing;Discomfort Pain Intervention(s): Repositioned;Limited activity  within patient's tolerance    Home Living Family/patient expects to be discharged to:: Skilled nursing facility Living Arrangements: Spouse/significant other Available Help at Discharge: Family Type of Home: House Home Access: Stairs to enter Entrance Stairs-Rails: None Entrance Stairs-Number of Steps: 4 Home  Layout: One level Home Equipment: Environmental consultantWalker - 2 wheels;Cane - single point Additional Comments: pt has been at SNF since last admission for frequent falls    Prior Function Level of Independence: Needs assistance   Gait / Transfers Assistance Needed: pt reports that she had worked with therapy at SNF a couple of times, unsure if she was walking at all  ADL's / Homemaking Assistance Needed: Pt was requiring assist with all functional tasks        Hand Dominance   Dominant Hand: Right    Extremity/Trunk Assessment   Upper Extremity Assessment Upper Extremity Assessment: Defer to OT evaluation RUE Deficits / Details: 3-/5 MM grade, poor strength and coordination RUE Coordination: decreased fine motor;decreased gross motor LUE Deficits / Details: inattention to L UE LUE: Unable to fully assess due to pain LUE Coordination: decreased fine motor;decreased gross motor    Lower Extremity Assessment Lower Extremity Assessment: Generalized weakness;LLE deficits/detail LLE Deficits / Details: difficult to assess due to cognition, L knee buckling in stance LLE Sensation: decreased proprioception LLE Coordination: decreased fine motor;decreased gross motor    Cervical / Trunk Assessment Cervical / Trunk Assessment: Kyphotic  Communication   Communication: Expressive difficulties(expressive aphasia at baseline)  Cognition Arousal/Alertness: Awake/alert Behavior During Therapy: WFL for tasks assessed/performed Overall Cognitive Status: No family/caregiver present to determine baseline cognitive functioning                                 General Comments: Pt stating to birthday in January, but self correcting. Pt overall A/O x3- unsure of situation. Pt continues to present with slowed processing and requires multimodal cues during mobility.       General Comments General comments (skin integrity, edema, etc.): BP taken in supine and sitting, NOT orthostatic, could not  maintain standing long enough for BP. Pt can cross midline to L with gaze but does not do so unless cued    Exercises     Assessment/Plan    PT Assessment Patient needs continued PT services  PT Problem List Decreased strength;Decreased balance;Decreased mobility;Decreased cognition;Decreased knowledge of use of DME;Decreased safety awareness;Decreased knowledge of precautions       PT Treatment Interventions DME instruction;Gait training;Functional mobility training;Therapeutic activities;Therapeutic exercise;Balance training;Patient/family education    PT Goals (Current goals can be found in the Care Plan section)  Acute Rehab PT Goals Patient Stated Goal: to eat the applesauce PT Goal Formulation: Patient unable to participate in goal setting Time For Goal Achievement: 10/02/19 Potential to Achieve Goals: Fair    Frequency Min 3X/week   Barriers to discharge        Co-evaluation PT/OT/SLP Co-Evaluation/Treatment: Yes Reason for Co-Treatment: For patient/therapist safety;Complexity of the patient's impairments (multi-system involvement);Necessary to address cognition/behavior during functional activity PT goals addressed during session: Mobility/safety with mobility;Balance         AM-PAC PT "6 Clicks" Mobility  Outcome Measure Help needed turning from your back to your side while in a flat bed without using bedrails?: Total Help needed moving from lying on your back to sitting on the side of a flat bed without using bedrails?: Total Help needed moving to and from a bed to  a chair (including a wheelchair)?: Total Help needed standing up from a chair using your arms (e.g., wheelchair or bedside chair)?: Total Help needed to walk in hospital room?: Total Help needed climbing 3-5 steps with a railing? : Total 6 Click Score: 6    End of Session Equipment Utilized During Treatment: Gait belt Activity Tolerance: Patient tolerated treatment well Patient left: in bed;with  call bell/phone within reach;with bed alarm set Nurse Communication: Mobility status PT Visit Diagnosis: History of falling (Z91.81);Muscle weakness (generalized) (M62.81);Repeated falls (R29.6);Unsteadiness on feet (R26.81);Pain Pain - Right/Left: Left Pain - part of body: Arm    Time: 4585-9292 PT Time Calculation (min) (ACUTE ONLY): 30 min   Charges:   PT Evaluation $PT Eval Moderate Complexity: [place]  Pager [phone] Office [place] 09/18/2019, 1:24 PM

## 2019-09-18 NOTE — Progress Notes (Signed)
PROGRESS NOTE    [name]  ZOX:[id number] DOB: [birth year] DOA: 09/16/2019 PCP: Kathleen Quint, MD   Brief Narrative: [name] is [name] 78 y.o. 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  Patient unable to provide history and does not know why she is in the hospital.  No family at bedside. Per EDP report, daughter noticed that about [name]  week ago she seemed to have left-sided neglect and some slurred speech.  Facility reportedly does not feel that she has any deviations from her baseline but requested ED eval due to family concerns.  Patient denies any frank pain but was intolerant of having her left shoulder examined or be turned over for respiratory exam.  She was recently evaluated in the ED last week for [name] fall.  ED Course:  She was afebrile and hypotensive down to 80s over 60 which improved after 3 L of fluid resuscitation up to 150s over 90.  CBC showed no leukocytosis or anemia.  CMP showed mildly elevated creatinine of 1.48 from Erol Williams prior of 1.09.  Lactate of 2.2.  Urinalysis shows large leukocyte, negative nitrite and many WBC.   Assessment & Plan:   Active Problems:   Diabetes mellitus (HCC)   Chronic diastolic (congestive) heart failure (HCC)   Atrial fibrillation, chronic (HCC)   Hypotension   Cerebral thrombosis with cerebral infarction   Right frontoparietal lobe infarct - Discussed CT finding with Neurology Dr. Wilford Corner - believes new stroke likely sometime between last CT head on 10/6 and this admission.  - per neurology, suspect R frontal infarct 2/2 progressive R ICA stenosis in setting of urosepsis - Pt unable to get MRI brain due to foreign metal object in the past  - will obtain CTA head and neck -  moderate diffuse narrowing of mid distal cervical R ICA (new/progressed from CTA from 2019 and concerning for possible interval dissection), 65% atheromatous stenoses about carotid bifurcations bilaterally - severe and extensive atherosclerotic change throughout intracranial circulation (multifocal severe R A1 and V4 stenoses), short segment severe proximal R V1 stenosis.  Severe near occlsuive stenosis at the mid right subclavian artery.  Small layering bilateral pleural effusions. - unable to obtain MRI - repeat head CT with little interval change in size and appearance of evolving subacute R MCA territory infarct. - echocardiogram - EF 65-70% (see report) - continue Eliquis and aspirin  - Obtain A1c (9.6) and lipids (LDL 41, HDL 31) - PT/OT/SLT - Frequent neuro checks and keep on telemetry - Avoid low BP with severe stenosis (goal BP 130-150 given severe stenosis, continue IVF and follow  Left sided weakness - multifactorial from new right sided infarct and recent fall -pt intolerant of movement to left upper extremity due to left shoulder pain - she c/o pain all over, difficult to tell significance of this - Left shoulder X-ray negative. L forearm and hand films negative for acute findings - incomplete healing of distal radial fx seen on 06/2019, will touch base with orthopedics in AM - of note, her husband notes chronic pain since this was initially seen [name] few months ago  Possible Sepsis - Urine with large LE, rare bacteria, negative nitrite, RBC's and >50 WBC - blood cx NGTD and urine cx with insignificant growth (of note, urine cx collected  after abx) - CXR with minimal basilar atelectasis - Started on vanc/cefepime - d/c vanc and cefepime -> given negative cx, will narrow to ceftriaxone and plan for short course given negative culture (though will continue abx with hypotension at presentation and urine cx collected after abx)  Hypotension likely secondary to sepsis  - improved with 3L  fluid resuscitation, continue IVF - Continue to closely monitor to avoid any further hypotension especially in the setting of new infarct - hold antihypertensives  AKI: 2/2 above, improving  Atrial fibrillation -Currently rate controlled. -Continue Eliquis -Hold beta-blocker for permissive hypertension in the setting of new infarct  Type 2 diabetes -Hemoglobin A1c of 11.5 few weeks ago -Normally on 30 units of Lantus at bedtime - start on 20 units Lantus qHS and moderate SSI  Chronic diastolic CHF -Stable.  Patient is hypovolemic with improved blood pressure s/p fluid resuscitation.  Hypothyroidism -Continue levothyroxine  Anxiety - continue Effexor   DVT prophylaxis: eliquis Code Status: full  Family Communication: none at bedside - husband over phone Disposition Plan: pending further improvement   Consultants:   neurology  Procedures:  Echo IMPRESSIONS    1. Left ventricular ejection fraction, by visual estimation, is 65-70%. The left ventricle has normal function. Normal left ventricular size. Left ventricular septal wall thickness was severely increased. Severely increased left ventricular posterior  wall thickness.  2. Definity contrast agent was given IV to delineate the left ventricular endocardial borders.  3. Left ventricular diastolic Doppler parameters are indeterminate pattern of LV diastolic filling. The left ventricular diastology could not be evaluatedsecondary to atrial fibrillation. .  4. There is [name] dynamic mid LV cavitary gradient measuring at rest.  5. Global right ventricle has normal systolic function.The right ventricular size is normal. No increase in right ventricular wall thickness.  6. Left atrial size was mildly dilated.  7. Right atrial size was normal.  8. Mild to moderate mitral annular calcification.. Trace mitral valve regurgitation. No evidence of mitral stenosis.  9. The tricuspid valve is normal in structure. Tricuspid  valve regurgitation is trivial. 10. The aortic valve was not well visualized. Aortic valve regurgitation was not visualized by color flow Doppler. Structurally normal aortic valve, with no evidence of sclerosis or stenosis. 11. The pulmonic valve was normal in structure. Pulmonic valve regurgitation is not visualized by color flow Doppler. 12. Normal pulmonary artery systolic pressure. 13. The inferior vena cava is normal in size with greater than 50% respiratory variability, suggesting right atrial pressure of 3 mmHg.  Antimicrobials:  Anti-infectives (From admission, onward)   Start     Dose/Rate Route Frequency Ordered Stop   09/17/19 2200  vancomycin (VANCOCIN) IVPB 1000 mg/200 mL premix  Status:  Discontinued     1,000 mg 200 mL/hr over 60 Minutes Intravenous Every 24 hours 09/16/19 2241 09/18/19 1607   09/17/19 2000  ceFEPIme (MAXIPIME) 2 g in sodium chloride 0.9 % 100 mL IVPB  Status:  Discontinued     2 g 200 mL/hr over 30 Minutes Intravenous Every 24 hours 09/16/19 2241 09/17/19 0843   09/17/19 1000  ceFEPIme (MAXIPIME) 2 g in sodium chloride 0.9 % 100 mL IVPB  Status:  Discontinued     2 g 200 mL/hr over 30 Minutes Intravenous Every 12 hours 09/17/19 0843 09/18/19 1607   09/16/19 2330  vancomycin (VANCOCIN) 1,500 mg in sodium chloride 0.9 % 500 mL IVPB     1,500 mg 250 mL/hr over 120 Minutes Intravenous  Once 09/16/19 2315 09/17/19  0304   09/16/19 2245  vancomycin (VANCOCIN) 500 mg in sodium chloride 0.9 % 100 mL IVPB  Status:  Discontinued     500 mg 100 mL/hr over 60 Minutes Intravenous  Once 09/16/19 2241 09/16/19 2315   09/16/19 2230  ceFEPIme (MAXIPIME) 2 g in sodium chloride 0.9 % 100 mL IVPB     2 g 200 mL/hr over 30 Minutes Intravenous  Once 09/16/19 2216 09/17/19 0028   09/16/19 2230  metroNIDAZOLE (FLAGYL) IVPB 500 mg     500 mg 100 mL/hr over 60 Minutes Intravenous  Once 09/16/19 2216 09/17/19 0052   09/16/19 2230  vancomycin (VANCOCIN) IVPB 1000 mg/200 mL premix   Status:  Discontinued     1,000 mg 200 mL/hr over 60 Minutes Intravenous  Once 09/16/19 2216 09/16/19 2315     Subjective: C/o continue lue pain.    Objective: Vitals:   09/18/19 0321 09/18/19 0354 09/18/19 0815 09/18/19 1242  BP: (!) 98/48 (!) 161/82 (!) 129/94 124/86  Pulse: (!) 105 (!) 101 (!) 101 95  Resp: Temp: 98.1 F (36.7 C) 98.1 F (36.7 C) 98.1 F (36.7 C) 98.2 F (36.8 C)  TempSrc: Oral Oral Oral Oral  SpO2: 98%  99% 99%  Weight:      Height:        Intake/Output Summary (Last 24 hours) at 09/18/2019 1608 Last data filed at 09/18/2019 0334 Gross per 24 hour  Intake 1284.91 ml  Output 675 ml  Net 609.91 ml   Filed Weights   09/17/19 0200  Weight: 77 kg    Examination:  General: No acute distress. Cardiovascular: Heart sounds show Myli Pae irreg irreg rate, and rhythm.  Lungs: Clear to auscultation bilaterally Abdomen: Soft, nontender, nondistended Neurological: Alert. LUE not tested due to pain, LLE weakness (difficult to tell degree of LUE weakness due to pain). Cranial nerves II through XII grossly intact. Skin: Warm and dry. No rashes or lesions. Extremities: No clubbing or cyanosis. No edema.     Data Reviewed: I have personally reviewed following labs and imaging studies  CBC: Recent Labs  Lab 09/16/19 2110 09/17/19 0245 09/18/19 0307  WBC 9.8 10.5 7.6  HGB 13.3 10.8* 10.9*  HCT 41.6 34.7* 33.0*  MCV 83.5 85.3 81.7  PLT 366 295 294   Basic Metabolic Panel: Recent Labs  Lab 09/16/19 2110 09/17/19 0245 09/18/19 0307  NA 134* 138 139  K 4.1 3.5 3.5  CL 99 113* 110  CO2 21* 19* 20*  GLUCOSE 192* 108* 140*  BUN 40* 31* 19  CREATININE 1.48* 1.05* 1.08*  CALCIUM 9.9 7.9* 9.4  MG  --   --  1.8   GFR: Estimated Creatinine Clearance: 41.5 mL/min (Stockton Nunley) (by C-G formula based on SCr of 1.08 mg/dL (H)). Liver Function Tests: Recent Labs  Lab 09/16/19 2110 09/18/19 0307  AST 41 24  ALT 42 27  ALKPHOS 109 87  BILITOT 0.6 0.6    PROT 7.0 6.0*  ALBUMIN 3.5 2.8*   No results for input(s): LIPASE, AMYLASE in the last 168 hours. No results for input(s): AMMONIA in the last 168 hours. Coagulation Profile: Recent Labs  Lab 09/16/19 2300  INR 1.3*   Cardiac Enzymes: No results for input(s): CKTOTAL, CKMB, CKMBINDEX, TROPONINI in the last 168 hours. BNP (last 3 results) No results for input(s): PROBNP in the last 8760 hours. HbA1C: Recent Labs    09/17/19 0245  HGBA1C 9.6*   CBG: Recent Labs  Lab 09/17/19  5621 09/17/19 1502 09/17/19 2207 09/18/19 0617 09/18/19 1246  GLUCAP 109* 113* 139* 126* 209*   Lipid Profile: Recent Labs    09/17/19 0245  CHOL 92  HDL 31*  LDLCALC 41  TRIG 99  CHOLHDL 3.0   Thyroid Function Tests: Recent Labs    09/17/19 0112  TSH 0.499  FREET4 1.30*   Anemia Panel: No results for input(s): VITAMINB12, FOLATE, FERRITIN, TIBC, IRON, RETICCTPCT in the last 72 hours. Sepsis Labs: Recent Labs  Lab 09/16/19 2205 09/16/19 2300  LATICACIDVEN 2.2* 2.0*    Recent Results (from the past 240 hour(s))  Blood Culture (routine x 2)     Status: None (Preliminary result)   Collection Time: 09/16/19 10:22 PM   Specimen: BLOOD LEFT FOREARM  Result Value Ref Range Status   Specimen Description BLOOD LEFT FOREARM  Final   Special Requests   Final    BOTTLES DRAWN AEROBIC AND ANAEROBIC Blood Culture results may not be optimal due to an excessive volume of blood received in culture bottles   Culture   Final    NO GROWTH 2 DAYS Performed at [facility], [address] [place], [place] [address]    Report Status PENDING  Incomplete  Blood Culture (routine x 2)     Status: None (Preliminary result)   Collection Time: 09/16/19 10:22 PM   Specimen: BLOOD RIGHT HAND  Result Value Ref Range Status   Specimen Description BLOOD RIGHT HAND  Final   Special Requests   Final    BOTTLES DRAWN AEROBIC AND ANAEROBIC Blood Culture results may not be optimal due to an inadequate  volume of blood received in culture bottles   Culture   Final    NO GROWTH 2 DAYS Performed at [facility], [address] [place], [place] [address]    Report Status PENDING  Incomplete  SARS CORONAVIRUS 2 (TAT 6-24 HRS) Nasopharyngeal Nasopharyngeal Swab     Status: None   Collection Time: 09/16/19 11:56 PM   Specimen: Nasopharyngeal Swab  Result Value Ref Range Status   SARS Coronavirus 2 NEGATIVE NEGATIVE Final    Comment: (NOTE) SARS-CoV-2 target nucleic acids are NOT DETECTED. The SARS-CoV-2 RNA is generally detectable in upper and lower respiratory specimens during the acute phase of infection. Negative results do not preclude SARS-CoV-2 infection, do not rule out co-infections with other pathogens, and should not be used as the sole basis for treatment or other patient management decisions. Negative results must be combined with clinical observations, patient history, and epidemiological information. The expected result is Negative. Fact Sheet for Patients: HairSlick.no Fact Sheet for Healthcare Providers: quierodirigir.com This test is not yet approved or cleared by the [place] FDA and  has been authorized for detection and/or diagnosis of SARS-CoV-2 by FDA under an Emergency Use Authorization (EUA). This EUA will remain  in effect (meaning this test can be used) for the duration of the COVID-19 declaration under Section 56 4(b)(1) of the Act, 21 U.S.C. section 360bbb-3(b)(1), unless the authorization is terminated or revoked sooner. Performed at [facility], [address], [place], [place] [address]   Urine culture     Status: Abnormal   Collection Time: 09/17/19  1:08 AM   Specimen: Urine, Random  Result Value Ref Range Status   Specimen Description URINE, RANDOM  Final   Special Requests NONE  Final   Culture (Maikel Neisler)  Final    <10,000 COLONIES/mL INSIGNIFICANT GROWTH Performed at [facility], [address] [place], [place] [address]    Report Status 09/18/2019 FINAL  Final  MRSA PCR Screening     Status: None   Collection Time: 09/17/19  9:57 PM   Specimen: Nasal Mucosa; Nasopharyngeal  Result Value Ref Range Status   MRSA by PCR NEGATIVE NEGATIVE Final    Comment:        The GeneXpert MRSA Assay (FDA approved for NASAL specimens only), is one component of Norm Wray comprehensive MRSA colonization surveillance program. It is not intended to diagnose MRSA infection nor to guide or monitor treatment for MRSA infections. Performed at [facility], [address] [place], [place] [address]          Radiology Studies: Ct Angio Head W Or Wo Contrast  Result Date: 09/17/2019 CLINICAL DATA:  Follow-up examination for acute stroke, left-sided weakness. EXAM: CT ANGIOGRAPHY HEAD AND NECK TECHNIQUE: Multidetector CT imaging of the head and neck was performed using the standard protocol during bolus administration of intravenous contrast. Multiplanar CT image reconstructions and MIPs were obtained to evaluate the vascular anatomy. Carotid stenosis measurements (when applicable) are obtained utilizing NASCET criteria, using the distal internal carotid diameter as the denominator. CONTRAST:  75mL OMNIPAQUE IOHEXOL 350 MG/ML SOLN COMPARISON:  Prior CT from earlier same day as well as previous CTA from 05/19/2018. FINDINGS: CTA NECK FINDINGS Aortic arch: Visualized aortic arch of normal caliber with normal 3 vessel morphology. Moderate atherosclerotic change about the arch and origin of the great vessels without hemodynamically significant stenosis. Extensive atherosclerotic change seen throughout the visualized left subclavian artery without high-grade stenosis. There is Aquilla Shambley severe near occlusive stenosis involving the mid right subclavian artery, grossly stable from previous (series 7, image 278). Right carotid system: Right CCA patent from its origin to the bifurcation without  stenosis. Bulky calcified plaque about the right bifurcation with associated stenosis of up to 65% by NASCET criteria. Distally, the right ICA is somewhat irregular and attenuated, and diffusely narrowed as compared to the previous CTA, concerning for possible dissection (series 7, images 179-155). Finding is age indeterminate. No frank raised dissection flap visualized. Secondary moderate diffuse narrowing over the affected segment. Right ICA otherwise patent at the skull base. Left carotid system: Mild atheromatous stenosis at the origin of the left CCA. Left CCA otherwise widely patent to the bifurcation without significant stenosis. Bulky calcified plaque at the left bifurcation with associated stenosis of up to 65% by NASCET criteria. Superimposed small penetrating plaque noted at the proximal left ICA (series 7, image 213). Scattered multifocal centric plaque within the distal left ICA without significant stenosis. Left ICA otherwise patent to the skull base. Vertebral arteries: Both vertebral arteries arise from the subclavian arteries. Right vertebral artery dominant with Briannia Laba diffusely hypoplastic left vertebral artery. Focal plaque at the origin of the right vertebral artery with secondary severe stenosis, grossly stable from previous. Right vertebral otherwise widely patent to the skull base. Hypoplastic left vertebral artery severely diseased, and essentially occludes at the skull base, relatively stable from previous. Skeleton: No acute osseous finding. No discrete osseous lesions. Moderate cervical spondylosis at C4-5 through C6-7. Other neck: No other acute soft tissue abnormality within the neck. Upper chest: Few mildly enlarged right paratracheal and pretracheal nodes measure up to 13 mm, indeterminate, but could be reactive. Small layering bilateral pleural effusions with associated atelectasis. Diffuse interlobular septal thickening compatible with pulmonary interstitial edema. Subcentimeter  calcified granuloma noted at the central aspect of the left upper lobe,  stable. Review of the MIP images confirms the above findings CTA HEAD FINDINGS Anterior circulation: Petrous left ICA patent. Extensive atheromatous plaque throughout the left carotid siphon with associated moderate diffuse narrowing. Allie Ousley more focal severe stenosis again noted at the supraclinoid left ICA, stable (series 7, image 114). Left ICA terminus perfused. Left M1 irregular but patent without high-grade stenosis. Negative left MCA bifurcation. Extensive small vessel atheromatous irregularity throughout the left MCA branches without large vessel occlusion. Previously seen left M3 occlusion has recanalized. Petrous right ICA patent. Extensive plaque throughout the right carotid siphon with moderate to severe multifocal narrowing, similar to previous. Right ICA terminus perfused. Right M1 irregular but patent and bifurcates early. Extensive atheromatous irregularity throughout the right MCA branches without proximal occlusion. A1 segments irregular but patent bilaterally. Right A1 hypoplastic. Normal anterior communicating artery. Extensive atheromatous change throughout the left A2 segment with up to moderate multifocal stenoses. Right A2 severely disease and attenuated with multifocal severe stenoses, although does remain patent to its distal aspect. Appearance is stable from previous. 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 Moderate atherosclerotic change throughout the dominant right V4 segment with associated moderate to severe multifocal stenoses, stable. Right PICA patent proximally. Left PICA not visualized. Basilar diminutive with diffuse atheromatous irregularity but remains patent to its distal aspect, unchanged. Superior cerebral arteries patent bilaterally. Both of the posterior cerebral arteries primarily supplied via the basilar. PCAs demonstrate extensive atheromatous  irregularity but are patent to their distal aspects. Venous sinuses: Grossly patent allowing for timing of the contrast bolus. Anatomic variants: None significant. Review of the MIP images confirms the above findings IMPRESSION: 1. Negative CTA for large vessel occlusion. 2. Moderate diffuse narrowing of the mid-distal cervical right ICA, new/progressed relative to previous CTA from 2019, and suspicious for possible interval dissection, age indeterminate. No raised dissection flap or significant luminal irregularity. 3. 65% atheromatous stenoses about the carotid bifurcations bilaterally. 4. Severe and extensive atherosclerotic change throughout the intracranial circulation, relatively unchanged from previous. Again, most notable findings include multifocal severe right A2 and V4 stenoses. 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 5. Short-segment severe proximal right V1 stenosis. 6. Severe near occlusive stenosis at the mid right subclavian artery. 7. Small layering bilateral pleural effusions with associated pulmonary interstitial edema. Electronically Signed   By: Rise Mu M.D.   On: 09/17/2019 04:40   Dg Forearm Left  Result Date: 09/17/2019 CLINICAL DATA:  History of prior distal radial fracture, follow-up exam EXAM: LEFT FOREARM - 2 VIEW COMPARISON:  06/29/2019 FINDINGS: Prior distal radial and ulnar fractures are again identified and stable. Intra-articular involvement is noted. No significant callus formation is noted. IMPRESSION: Stable appearance of distal radial and ulnar fractures. Electronically Signed   By: Alcide Clever M.D.   On: 09/17/2019 12:22   Ct Head Wo Contrast  Result Date: 09/18/2019 CLINICAL DATA:  Follow-up examination for recent stroke. EXAM: CT HEAD WITHOUT CONTRAST TECHNIQUE: Contiguous axial images were obtained from the base of the skull through the vertex without intravenous contrast. COMPARISON:  Prior CT from 09/17/2019. FINDINGS: Brain:  Examination degraded by motion artifact. Evolving subacute right MCA territory infarct involving the right frontal lobe is relatively stable in size and distribution from previous. Few scattered subtle areas of hyperdensity within the area of infarction likely reflect petechial hemorrhage and/or evolving laminar necrosis, stable. No frank hemorrhagic transformation or significant mass effect. No other new acute large vessel territory infarct. Chronic posterior left MCA  territory infarction again noted. Underlying atrophy with chronic small vessel ischemic disease. No acute intracranial hemorrhage. No mass lesion, mass effect, or midline shift. No hydrocephalus. No extra-axial fluid collection. Vascular: No hyperdense vessel. Calcified atherosclerosis at the skull base. Skull: Scalp soft tissues and calvarium within normal limits. Sinuses/Orbits: Globes and orbital soft tissues within normal limits. Chronic right sphenoid sinusitis noted. Paranasal sinuses are otherwise clear. No mastoid effusion. Other: None. IMPRESSION: 1. Little interval change in size and appearance of evolving subacute right MCA territory infarct. Subtle serpiginous hyperdensity within the area of infarction most consistent with faint petechial hemorrhage or possibly evolving laminar necrosis. No evidence for hemorrhagic transformation. 2. Otherwise stable appearance of the brain with additional chronic ischemic changes as above. No other new acute intracranial abnormality. Electronically Signed   By: Jeannine Boga M.D.   On: 09/18/2019 07:03   Ct Head Wo Contrast  Result Date: 09/17/2019 CLINICAL DATA:  Altered mental status EXAM: CT HEAD WITHOUT CONTRAST TECHNIQUE: Contiguous axial images were obtained from the base of the skull through the vertex without intravenous contrast. COMPARISON:  August 31, 2019 FINDINGS: Brain: Since the prior exam there is Rakesha Dalporto increasing area of hypoattenuation within the right frontoparietal lobe, best  seen on series 3, image 23. Again seen are changes of prior left MCA distribution infarct. There is no significant interval progression. There is dilatation the ventricles and sulci consistent with age-related atrophy. Low-attenuation changes in the deep white matter consistent with small vessel ischemia. No extra-axial collections. Vascular: No hyperdense vessel or unexpected calcification. Skull: The skull is intact. No fracture or focal lesion identified. Sinuses/Orbits: The visualized paranasal sinuses and mastoid air cells are clear. The orbits and globes intact. Other: None IMPRESSION: 1. worsening area of hypoattenuation in the right frontoparietal lobe, consistent evolution of infarct. 2. Stable changes of prior infarct involving the left MCA territory. 3. Findings consistent with age related atrophy and chronic small vessel ischemia 4. These results were called by telephone at the time of interpretation on 09/17/2019 at 1:23 am to provider [facility] , who verbally acknowledged these results. Electronically Signed   By: Prudencio Pair M.D.   On: 09/17/2019 01:24   Ct Angio Neck W Or Wo Contrast  Result Date: 09/17/2019 CLINICAL DATA:  Follow-up examination for acute stroke, left-sided weakness. EXAM: CT ANGIOGRAPHY HEAD AND NECK TECHNIQUE: Multidetector CT imaging of the head and neck was performed using the standard protocol during bolus administration of intravenous contrast. Multiplanar CT image reconstructions and MIPs were obtained to evaluate the vascular anatomy. Carotid stenosis measurements (when applicable) are obtained utilizing NASCET criteria, using the distal internal carotid diameter as the denominator. CONTRAST:  21mL OMNIPAQUE IOHEXOL 350 MG/ML SOLN COMPARISON:  Prior CT from earlier same day as well as previous CTA from 05/19/2018. FINDINGS: CTA NECK FINDINGS Aortic arch: Visualized aortic arch of normal caliber with normal 3 vessel morphology. Moderate atherosclerotic change about  the arch and origin of the great vessels without hemodynamically significant stenosis. Extensive atherosclerotic change seen throughout the visualized left subclavian artery without high-grade stenosis. There is Tracee Mccreery severe near occlusive stenosis involving the mid right subclavian artery, grossly stable from previous (series 7, image 278). Right carotid system: Right CCA patent from its origin to the bifurcation without stenosis. Bulky calcified plaque about the right bifurcation with associated stenosis of up to 65% by NASCET criteria. Distally, the right ICA is somewhat irregular and attenuated, and diffusely narrowed as compared to the previous CTA, concerning for possible dissection (  series 7, images [id number]). Finding is age indeterminate. No frank raised dissection flap visualized. Secondary moderate diffuse narrowing over the affected segment. Right ICA otherwise patent at the skull base. Left carotid system: Mild atheromatous stenosis at the origin of the left CCA. Left CCA otherwise widely patent to the bifurcation without significant stenosis. Bulky calcified plaque at the left bifurcation with associated stenosis of up to 65% by NASCET criteria. Superimposed small penetrating plaque noted at the proximal left ICA (series 7, image 213). Scattered multifocal centric plaque within the distal left ICA without significant stenosis. Left ICA otherwise patent to the skull base. Vertebral arteries: Both vertebral arteries arise from the subclavian arteries. Right vertebral artery dominant with Kylyn Sookram diffusely hypoplastic left vertebral artery. Focal plaque at the origin of the right vertebral artery with secondary severe stenosis, grossly stable from previous. Right vertebral otherwise widely patent to the skull base. Hypoplastic left vertebral artery severely diseased, and essentially occludes at the skull base, relatively stable from previous. Skeleton: No acute osseous finding. No discrete osseous lesions. Moderate  cervical spondylosis at C4-5 through C6-7. Other neck: No other acute soft tissue abnormality within the neck. Upper chest: Few mildly enlarged right paratracheal and pretracheal nodes measure up to 13 mm, indeterminate, but could be reactive. Small layering bilateral pleural effusions with associated atelectasis. Diffuse interlobular septal thickening compatible with pulmonary interstitial edema. Subcentimeter calcified granuloma noted at the central aspect of the left upper lobe, stable. Review of the MIP images confirms the above findings CTA HEAD FINDINGS Anterior circulation: Petrous left ICA patent. Extensive atheromatous plaque throughout the left carotid siphon with associated moderate diffuse narrowing. Lianette Broussard more focal severe stenosis again noted at the supraclinoid left ICA, stable (series 7, image 114). Left ICA terminus perfused. Left M1 irregular but patent without high-grade stenosis. Negative left MCA bifurcation. Extensive small vessel atheromatous irregularity throughout the left MCA branches without large vessel occlusion. Previously seen left M3 occlusion has recanalized. Petrous right ICA patent. Extensive plaque throughout the right carotid siphon with moderate to severe multifocal narrowing, similar to previous. Right ICA terminus perfused. Right M1 irregular but patent and bifurcates early. Extensive atheromatous irregularity throughout the right MCA branches without proximal occlusion. A1 segments irregular but patent bilaterally. Right A1 hypoplastic. Normal anterior communicating artery. Extensive atheromatous change throughout the left A2 segment with up to moderate multifocal stenoses. Right A2 severely disease and attenuated with multifocal severe stenoses, although does remain patent to its distal aspect. Appearance is stable from previous. 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 Moderate atherosclerotic change throughout the dominant  right V4 segment with associated moderate to severe multifocal stenoses, stable. Right PICA patent proximally. Left PICA not visualized. Basilar diminutive with diffuse atheromatous irregularity but remains patent to its distal aspect, unchanged. Superior cerebral arteries patent bilaterally. Both of the posterior cerebral arteries primarily supplied via the basilar. PCAs demonstrate extensive atheromatous irregularity but are patent to their distal aspects. Venous sinuses: Grossly patent allowing for timing of the contrast bolus. Anatomic variants: None significant. Review of the MIP images confirms the above findings IMPRESSION: 1. Negative CTA for large vessel occlusion. 2. Moderate diffuse narrowing of the mid-distal cervical right ICA, new/progressed relative to previous CTA from 2019, and suspicious for possible interval dissection, age indeterminate. No raised dissection flap or significant luminal irregularity. 3. 65% atheromatous stenoses about the carotid bifurcations bilaterally. 4. Severe and extensive atherosclerotic change throughout the intracranial circulation, relatively unchanged from previous. Again, most notable findings include  multifocal severe right A2 and V4 stenoses. 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 5. Short-segment severe proximal right V1 stenosis. 6. Severe near occlusive stenosis at the mid right subclavian artery. 7. Small layering bilateral pleural effusions with associated pulmonary interstitial edema. Electronically Signed   By: Rise Mu M.D.   On: 09/17/2019 04:40   Dg Hand 2 View Left  Result Date: 09/17/2019 CLINICAL DATA:  Pt brought in for multiple falls and left-sided weakness. AMS. Pt states hx of broken wrist APPROX.1 month ago. Pt c/o pain on entire left side. Pt unable to move arm at all, limited ROM with external rotation. Screaming with every touch. EXAM: The COMPARISON:  None. FINDINGS: Lucency through the distal radial  metaphysis with disruption of the articular surface consistent with incomplete healing of fracture demonstrated on radiograph 06/29/2019. Fracture is intra-articular. No acute fracture in the wrist. Degenerative change at the first carpometacarpal joint. IMPRESSION: Incomplete healing of distal radial fracture. Electronically Signed   By: Genevive Bi M.D.   On: 09/17/2019 12:22   Dg Chest Port 1 View  Result Date: 09/16/2019 CLINICAL DATA:  Hypotension EXAM: PORTABLE CHEST 1 VIEW COMPARISON:  08/31/2019 FINDINGS: Mild cardiomegaly with aortic atherosclerosis. Streaky atelectasis at the bases. No consolidation, pleural effusion, or pneumothorax. IMPRESSION: 1. Mild cardiomegaly with minimal basilar atelectasis. Electronically Signed   By: Jasmine Pang M.D.   On: 09/16/2019 22:52   Dg Humerus Left  Result Date: 09/17/2019 CLINICAL DATA:  Left arm pain. EXAM: LEFT HUMERUS - 2+ VIEW COMPARISON:  Shoulder radiograph 06/04/2019 FINDINGS: Cortical margins of the humerus are intact. Humeral head osteophytes with advanced glenohumeral osteoarthritis demonstrated on prior shoulder exam. Cortical margins of the humerus are intact. There is no evidence of fracture or other focal bone lesions. Soft tissues are unremarkable. IMPRESSION: No acute findings. Advanced shoulder osteoarthritis, demonstrated on prior shoulder exam. Electronically Signed   By: Narda Rutherford M.D.   On: 09/17/2019 00:42        Scheduled Meds:  apixaban  5 mg Oral BID   aspirin EC  81 mg Oral Daily   atorvastatin  40 mg Oral q1800   insulin aspart  0-15 Units Subcutaneous TID WC   insulin glargine  20 Units Subcutaneous QHS   levothyroxine  50 mcg Oral QAC breakfast   magnesium oxide  800 mg Oral BID   mouth rinse  15 mL Mouth Rinse BID   polyvinyl alcohol  1 drop Both Eyes QID   potassium chloride  10 mEq Oral Daily   venlafaxine XR  150 mg Oral Q breakfast   Continuous Infusions:  sodium chloride 75  mL/hr at 09/18/19 0614     LOS: 1 day    Time spent: over 30 min    Lacretia Nicks, MD [facility] Pager AMION  If 7PM-7AM, please contact night-coverage [email or web address] Password [email or web address] 09/18/2019, 4:08 PM

## 2019-09-18 NOTE — Progress Notes (Signed)
Orthopedic Tech Progress Note Patient Details:  [name] [birth year] [id number]  Ortho Devices Type of Ortho Device: Velcro wrist splint Ortho Device/Splint Location: ULE Ortho Device/Splint Interventions: Adjustment, Application, Ordered   Post Interventions Patient Tolerated: Well Instructions Provided: Care of device, Adjustment of device   [name] 09/18/2019, 5:43 PM

## 2019-09-18 NOTE — Evaluation (Signed)
Occupational Therapy Evaluation Patient Details Name: [name] Swords MRN: [id number] DOB: [birth year] Today's Date: 09/18/2019    History of Present Illness [name] Donahoo is a 78 y.o. 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 Of note, pt with R shoulder pain from recent fall. MRI revealed R frontoparietal CVA L arm pain from stable distal radial and ulnar fxs.    Clinical Impression   Pt PTA: Pt from SNF from recent falls and hospitalization. Pt was working with therapy at facility. Pt currently has significant deficits in L inattention, poor visual deficits with R gaze preference, weakness in L side and poor mobility. Pt with LUE nearly flaccid and unable to attend to throughout session without maximal cues. Pt able to feed self applesauce, otherwise maxA to totalA for ADL. Pt maxA +2 for bed mobility and sit to stands (partially) as pt unable to stand without leaning to L and posteriorly. Pt would benefit from continued OT skilled services for ADL, LUE and visual deficits. OT following acutely.    Follow Up Recommendations  SNF;Supervision/Assistance - 24 hour    Equipment Recommendations  Other (comment)(to be determined at next venue)    Recommendations for Other Services       Precautions / Restrictions Precautions Precautions: Fall Restrictions Weight Bearing Restrictions: No Other Position/Activity Restrictions: Pt with stable LUE ulnar and radial fx      Mobility Bed Mobility Overal bed mobility: Needs Assistance Bed Mobility: Supine to Sit;Sit to Supine;Rolling Rolling: Max assist;+2 for physical assistance   Supine to sit: Max assist;+2 for physical assistance Sit to supine: Max assist;+2 for  physical assistance   General bed mobility comments: maxA+2 for trunk elevation and BLE movement off of bed. Pt requiring maxA to scoot EOB.  Transfers Overall transfer level: Needs assistance Equipment used: 2 person hand held assist Transfers: Sit to/from Stand Sit to Stand: Max assist;+2 safety/equipment;+2 physical assistance         General transfer comment: Unsuccesful with RW; MaxA+2 HHA doe x2 standing attempts and pt unable to weight shift [facility]    Balance Overall balance assessment: Needs assistance Sitting-balance support: No upper extremity supported;Feet supported Sitting balance-Leahy Scale: Poor Sitting balance - Comments: Pt supervisionA to minA overall as pt could self correct when leaning to L side with verbal and tactile cues to obtain upright sitting.  Pt with poor balance for dynamic reaching task with RUE.   Standing balance support: Bilateral upper extremity supported Standing balance-Leahy Scale: Poor Standing balance comment: Reliant on BUE support and leaning to L and on bed.                           ADL either performed or assessed with clinical judgement   ADL Overall ADL's : Needs assistance/impaired Eating/Feeding: Set up;Minimal assistance;Sitting Eating/Feeding Details (indicate cue type and reason): for applesauce- easy meal Grooming: Minimal assistance;Sitting;Bed level   Upper Body Bathing: Minimal assistance;Sitting;Bed level   Lower Body Bathing: Maximal assistance;Sitting/lateral leans;Sit to/from stand;Bed level;+2 for physical assistance   Upper Body Dressing : Minimal assistance;Sitting   Lower Body Dressing: Maximal assistance;Sitting/lateral leans;Sit to/from stand;+2 for physical assistance     Toilet Transfer Details (indicate cue type and  reason): unable to perform Toileting- Clothing Manipulation and Hygiene: Maximal assistance;+2 for physical assistance;Sitting/lateral lean;Sit to/from stand        Functional mobility during ADLs: Maximal assistance;+2 for physical assistance;+2 for safety/equipment;Cueing for safety;Cueing for sequencing General ADL Comments: Pt requiring increased assist. Pt with deficits in L inattention, poor strength, decresed coordination and poor mobility.     Vision   Vision Assessment?: Yes Eye Alignment: Within Functional Limits Ocular Range of Motion: Restricted on the left Alignment/Gaze Preference: Head turned;Gaze right Tracking/Visual Pursuits: Left eye does not track laterally;Impaired - to be further tested in functional context Additional Comments: pt with poor attention and unable to fully assess     Perception Perception Spatial deficits: Unaware of LUE   Praxis      Pertinent Vitals/Pain Pain Assessment: Faces Faces Pain Scale: Hurts even more Pain Location: L arm elbow through hand Pain Descriptors / Indicators: Grimacing;Discomfort Pain Intervention(s): Repositioned     Hand Dominance Right   Extremity/Trunk Assessment Upper Extremity Assessment Upper Extremity Assessment: RUE deficits/detail;LUE deficits/detail RUE Deficits / Details: 3-/5 MM grade, poor strength and coordination RUE Coordination: decreased fine motor;decreased gross motor LUE Deficits / Details: stable ulnar and radial fx; inattention and weakness, swelling noted LUE: Unable to fully assess due to pain LUE Coordination: decreased fine motor;decreased gross motor   Lower Extremity Assessment Lower Extremity Assessment: Generalized weakness;Defer to PT evaluation   Cervical / Trunk Assessment Cervical / Trunk Assessment: Kyphotic   Communication Communication Communication: Expressive difficulties(epxressive aphasia at baseline)   Cognition Arousal/Alertness: Awake/alert Behavior During Therapy: WFL for tasks assessed/performed Overall Cognitive Status: No family/caregiver present to determine baseline cognitive functioning                                  General Comments: Pt stating to birthday in January, but self correcting. Pt overall A/O x3- unsure of situation. Pt continues to present with slowed processing and requires multimodal cues during mobility.    General Comments  Pt follows commnds well, but appears to have poor attention and L inattention.    Exercises     Shoulder Instructions      Home Living Family/patient expects to be discharged to:: Skilled nursing facility                                        Prior Functioning/Environment Level of Independence: Needs assistance  Gait / Transfers Assistance Needed: Pt came from SNF and was getting OOB with staff ADL's / Homemaking Assistance Needed: Pt was requiring assist with all functional tasks            OT Problem List: Decreased strength;Decreased activity tolerance;Impaired vision/perception;Decreased coordination;Decreased safety awareness;Pain;Impaired UE functional use      OT Treatment/Interventions: Self-care/ADL training;Therapeutic exercise;Neuromuscular education;Energy conservation;DME and/or AE instruction;Manual therapy;Therapeutic activities;Cognitive remediation/compensation;Visual/perceptual remediation/compensation    OT Goals(Current goals can be found in the care plan section) Acute Rehab OT Goals Patient Stated Goal: to eat the applesauce OT Goal Formulation: With patient Time For Goal Achievement: 10/02/19 Potential to Achieve Goals: Good ADL Goals Pt Will Perform Eating: with supervision;sitting Pt Will Perform Upper Body Dressing: with min assist;sitting Pt Will Transfer to Toilet: with mod assist;stand pivot transfer;bedside commode Pt/caregiver will Perform Home Exercise Program: Left upper extremity;With minimal assist;With written HEP provided;Increased strength Additional ADL Goal #1: Pt will  attend to L side in 3/5 trials with multimodal cues minimally  OT Frequency: Min 2X/week   Barriers to  D/C:            Co-evaluation              AM-PAC OT "6 Clicks" Daily Activity     Outcome Measure Help from another person eating meals?: A Lot Help from another person taking care of personal grooming?: A Lot Help from another person toileting, which includes using toliet, bedpan, or urinal?: Total Help from another person bathing (including washing, rinsing, drying)?: Total Help from another person to put on and taking off regular upper body clothing?: A Lot Help from another person to put on and taking off regular lower body clothing?: Total 6 Click Score: 9   End of Session Equipment Utilized During Treatment: Gait belt Nurse Communication: Mobility status  Activity Tolerance: Treatment limited secondary to medical complications (Comment);Patient limited by pain Patient left: in bed;with call bell/phone within reach;with bed alarm set  OT Visit Diagnosis: Muscle weakness (generalized) (M62.81);Pain;Hemiplegia and hemiparesis Hemiplegia - Right/Left: Left Hemiplegia - dominant/non-dominant: Non-Dominant Hemiplegia - caused by: Cerebral infarction Pain - Right/Left: Left Pain - part of body: Arm                Time: 3532-9924 OT Time Calculation (min): 27 min Charges:  OT General Charges $OT Visit: 1 Visit OT Evaluation $OT Eval Moderate Complexity: 1 Mod  Darryl Nestle) Marsa Aris OTR/L Acute Rehabilitation Services Pager: [phone] Office: [place] 09/18/2019, 10:57 AM

## 2019-09-18 NOTE — Evaluation (Signed)
Clinical/Bedside Swallow Evaluation Patient Details  Name: [name] MRN: [id number] Date of Birth: [birth year]  Today's Date: 09/18/2019 Time: SLP Start Time (ACUTE ONLY): 1110 SLP Stop Time (ACUTE ONLY): 1127 SLP Time Calculation (min) (ACUTE ONLY): 17 min  Past Medical History:  Past Medical History:  Diagnosis Date  . Abdominal discomfort    "due to medication intolerance"  . CAD (coronary artery disease)    previous stent  . Chronic pain syndrome 09/10/2013   Dr Nelva Bush,   . [place] Patient virus infection 1988  . Excessive daytime sleepiness 12/05/2014   Patient has not been seen for a sleep study as ordered, and used adderall to keep alert in daytime.  She agreed  In a contract not to have any scheduled medication for pain treatment from [facility] and will not receive refills for Adderall, which was initiated by dr Orland Penman, PCP>   . Fall   . Hyperlipemia   . Hypersomnia, persistent 09/10/2013   Patient on  Stimulants .  Marland Kitchen Hypertension   . Narcotic addiction ([place]) 12/05/2014  . Obesity, unspecified 09/10/2013  . Shoulder joint pain    both shoulders  . Stroke [facility])    Past Surgical History:  Past Surgical History:  Procedure Laterality Date  . ANGIOPLASTY  03/01/2002   cutting balloon mid RCA  . BACK SURGERY    . CARDIAC CATHETERIZATION  01/26/2007   mild diffuse CAD  . CARDIAC CATHETERIZATION  10/23/2009   nonobstructive CAD,60% prox RCA,50% prox LAD, 50% mid ramus  . CORONARY STENT PLACEMENT  03/15/2005   distal RCA  . NEPHRECTOMY    . TEE WITHOUT CARDIOVERSION N/A 07/03/2018   Procedure: TRANSESOPHAGEAL ECHOCARDIOGRAM (TEE);  Surgeon: Skeet Latch, MD;  Location: [facility];  Service: Cardiovascular;  Laterality: N/A;   HPI:  [name] is a 78 y.o. female PMHx: CAD s/p stent in 2006, atrial fibrillation on Eliquis, history of CVA with residual aphasia and right-sided weakness, chronic diastolic CHF, hypertension, type 2 diabetes, hypothyroidism who presented from  nursing facility at the request of family member who felt that pt had L neglect and slurred speech. Of note, pt with R shoulder pain from recent fall. MRI revealed R frontoparietal CVA L arm pain from stable distal radial and ulnar fxs.    Assessment / Plan / Recommendation Clinical Impression  Pt presents with mild dysphagia c/b prolonged mastication, prolonged AP transport, multiple swallows per bolus, and suspected delayed swallow initiation.  Pt consumed trials of thin liquid, puree, and regular solids.  She exhibited eructation x2 and a delayed throat clear x1 following serial sips of thin liquid, eliminated with cues for single sips.  No clinical s/sx of aspiration were observed with any additional trials.  Although mastication and AP transport were prolonged with regular solid trials, mastication was effective and no oral residue was observed.  Recommend continuation of regular solids and thin liquid with the following precautions: 1) Small bites/sips 2) Slow rate of intake 3) Sit upright 90 degrees 4) Remain upright for 20-30 minutes following po intake.   Cognitive-linguistic screen was remarkable for verbal perseveration, phonemic paraphasias, and suspected reduced cognitive abilities.  Pt was oriented x3 to self, situation, and month.  She was not oriented to place or year. Deficits may be residual from previous CVA; however, no family was present to determine cognitive-linguistic baseline. Recommend ordering a comprehensive cognitive-linguistic evaluation if it aligns with MD plan.     SLP Visit Diagnosis: Dysphagia, unspecified (R13.10)    Aspiration  Risk  Mild aspiration risk    Diet Recommendation Regular;Thin liquid   Liquid Administration via: Cup;Straw Medication Administration: Whole meds with puree Supervision: Staff to assist with self feeding;Full supervision/cueing for compensatory strategies Compensations: Minimize environmental distractions;Slow rate;Small  sips/bites Postural Changes: Seated upright at 90 degrees    Other  Recommendations Recommended Consults: Consider GI evaluation Oral Care Recommendations: Oral care BID;Staff/trained caregiver to provide oral care   Follow up Recommendations 24 hour supervision/assistance      Frequency and Duration min 2x/week  2 weeks       Prognosis Barriers to Reach Goals: Cognitive deficits      Swallow Study   General HPI: [name] is a 78 y.o. 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 Of note, pt with R shoulder pain from recent fall. MRI revealed R frontoparietal CVA L arm pain from stable distal radial and ulnar fxs.  Type of Study: Bedside Swallow Evaluation Previous Swallow Assessment: None Diet Prior to this Study: Regular;Thin liquids Temperature Spikes Noted: No Respiratory Status: Room air History of Recent Intubation: No Behavior/Cognition: Alert;Cooperative;Pleasant mood;Confused Oral Cavity Assessment: Within Functional Limits Oral Care Completed by SLP: No Oral Cavity - Dentition: Adequate natural dentition Vision: Functional for self-feeding Self-Feeding Abilities: Needs assist Patient Positioning: Upright in bed Baseline Vocal Quality: Normal Volitional Swallow: Able to elicit    Oral/Motor/Sensory Function Overall Oral Motor/Sensory Function: Mild impairment Facial ROM: Within Functional Limits Facial Symmetry: Within Functional Limits Facial Sensation: Within Functional Limits Lingual ROM: Reduced right;Reduced left Lingual Symmetry: Abnormal symmetry right Lingual Strength: Reduced   Ice Chips Ice chips: Not tested   Thin Liquid Thin Liquid: Impaired Presentation:  Spoon;Straw Pharyngeal  Phase Impairments: Multiple swallows;Suspected delayed Swallow;Throat Clearing - Delayed    Nectar Thick Nectar Thick Liquid: Not tested   Honey Thick Honey Thick Liquid: Not tested   Puree Puree: Within functional limits Presentation: Spoon   Solid     Solid: Impaired Presentation: Spoon Oral Phase Impairments: Impaired mastication Oral Phase Functional Implications: Impaired mastication;Prolonged oral transit     [name], M.S., CCC-SLP Acute Rehabilitation Services Office: [phone]  [name] 09/18/2019,11:38 AM

## 2019-09-18 NOTE — Progress Notes (Signed)
STROKE TEAM PROGRESS NOTE   INTERVAL HISTORY  No major changes.  CT today is no different vs yesterday and shows right frontal hypodensity and left parietal encephalomalacia. CTA - bilateral ICA stenosis   Vitals:   09/17/19 2024 09/17/19 2321 09/18/19 0321 09/18/19 0354  BP: (!) 140/96 115/77 (!) 98/48 (!) 161/82  Pulse: 82 93 (!) 105 (!) 101  Resp: Temp: 98.1 F (36.7 C) 98.4 F (36.9 C) 98.1 F (36.7 C) 98.1 F (36.7 C)  TempSrc: Oral Oral Oral Oral  SpO2: 100% 100% 98%   Weight:      Height:        CBC:  Recent Labs  Lab 09/17/19 0245 09/18/19 0307  WBC 10.5 7.6  HGB 10.8* 10.9*  HCT 34.7* 33.0*  MCV 85.3 81.7  PLT 295 294    Basic Metabolic Panel:  Recent Labs  Lab 09/17/19 0245 09/18/19 0307  NA 138 139  K 3.5 3.5  CL 113* 110  CO2 19* 20*  GLUCOSE 108* 140*  BUN 31* 19  CREATININE 1.05* 1.08*  CALCIUM 7.9* 9.4  MG  --  1.8   Lipid Panel:     Component Value Date/Time   CHOL 92 09/17/2019 0245   CHOL 346 (H) 09/23/2017 1803   TRIG 99 09/17/2019 0245   HDL 31 (L) 09/17/2019 0245   HDL 56 09/23/2017 1803   CHOLHDL 3.0 09/17/2019 0245   VLDL 20 09/17/2019 0245   LDLCALC 41 09/17/2019 0245   LDLCALC Comment 09/23/2017 1803   HgbA1c:  Lab Results  Component Value Date   HGBA1C 9.6 (H) 09/17/2019   Urine Drug Screen:     Component Value Date/Time   LABOPIA POSITIVE (A) 07/02/2018 2208   COCAINSCRNUR NONE DETECTED 07/02/2018 2208   LABBENZ POSITIVE (A) 07/02/2018 2208   AMPHETMU NONE DETECTED 07/02/2018 2208   THCU NONE DETECTED 07/02/2018 2208   LABBARB NONE DETECTED 07/02/2018 2208    Alcohol Level     Component Value Date/Time   ETH <10 05/19/2018 0237    IMAGING Ct Angio Head W Or Wo Contrast  Result Date: 09/17/2019 CLINICAL DATA:  Follow-up examination for acute stroke, left-sided weakness. EXAM: CT ANGIOGRAPHY HEAD AND NECK TECHNIQUE: Multidetector CT imaging of the head and neck was performed using the  standard protocol during bolus administration of intravenous contrast. Multiplanar CT image reconstructions and MIPs were obtained to evaluate the vascular anatomy. Carotid stenosis measurements (when applicable) are obtained utilizing NASCET criteria, using the distal internal carotid diameter as the denominator. CONTRAST:  75mL OMNIPAQUE IOHEXOL 350 MG/ML SOLN COMPARISON:  Prior CT from earlier same day as well as previous CTA from 05/19/2018. FINDINGS: CTA NECK FINDINGS Aortic arch: Visualized aortic arch of normal caliber with normal 3 vessel morphology. Moderate atherosclerotic change about the arch and origin of the great vessels without hemodynamically significant stenosis. Extensive atherosclerotic change seen throughout the visualized left subclavian artery without high-grade stenosis. There is a severe near occlusive stenosis involving the mid right subclavian artery, grossly stable from previous (series 7, image 278). Right carotid system: Right CCA patent from its origin to the bifurcation without stenosis. Bulky calcified plaque about the right bifurcation with associated stenosis of up to 65% by NASCET criteria. Distally, the right ICA is somewhat irregular and attenuated, and diffusely narrowed as compared to the previous CTA, concerning for possible dissection (series 7, images 179-155). Finding is age indeterminate. No frank raised dissection flap visualized. Secondary moderate diffuse narrowing over  the affected segment. Right ICA otherwise patent at the skull base. Left carotid system: Mild atheromatous stenosis at the origin of the left CCA. Left CCA otherwise widely patent to the bifurcation without significant stenosis. Bulky calcified plaque at the left bifurcation with associated stenosis of up to 65% by NASCET criteria. Superimposed small penetrating plaque noted at the proximal left ICA (series 7, image 213). Scattered multifocal centric plaque within the distal left ICA without significant  stenosis. Left ICA otherwise patent to the skull base. Vertebral arteries: Both vertebral arteries arise from the subclavian arteries. Right vertebral artery dominant with a diffusely hypoplastic left vertebral artery. Focal plaque at the origin of the right vertebral artery with secondary severe stenosis, grossly stable from previous. Right vertebral otherwise widely patent to the skull base. Hypoplastic left vertebral artery severely diseased, and essentially occludes at the skull base, relatively stable from previous. Skeleton: No acute osseous finding. No discrete osseous lesions. Moderate cervical spondylosis at C4-5 through C6-7. Other neck: No other acute soft tissue abnormality within the neck. Upper chest: Few mildly enlarged right paratracheal and pretracheal nodes measure up to 13 mm, indeterminate, but could be reactive. Small layering bilateral pleural effusions with associated atelectasis. Diffuse interlobular septal thickening compatible with pulmonary interstitial edema. Subcentimeter calcified granuloma noted at the central aspect of the left upper lobe, stable. Review of the MIP images confirms the above findings CTA HEAD FINDINGS Anterior circulation: Petrous left ICA patent. Extensive atheromatous plaque throughout the left carotid siphon with associated moderate diffuse narrowing. A more focal severe stenosis again noted at the supraclinoid left ICA, stable (series 7, image 114). Left ICA terminus perfused. Left M1 irregular but patent without high-grade stenosis. Negative left MCA bifurcation. Extensive small vessel atheromatous irregularity throughout the left MCA branches without large vessel occlusion. Previously seen left M3 occlusion has recanalized. Petrous right ICA patent. Extensive plaque throughout the right carotid siphon with moderate to severe multifocal narrowing, similar to previous. Right ICA terminus perfused. Right M1 irregular but patent and bifurcates early. Extensive  atheromatous irregularity throughout the right MCA branches without proximal occlusion. A1 segments irregular but patent bilaterally. Right A1 hypoplastic. Normal anterior communicating artery. Extensive atheromatous change throughout the left A2 segment with up to moderate multifocal stenoses. Right A2 severely disease and attenuated with multifocal severe stenoses, although does remain patent to its distal aspect. Appearance is stable from previous. 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 Moderate atherosclerotic change throughout the dominant right V4 segment with associated moderate to severe multifocal stenoses, stable. Right PICA patent proximally. Left PICA not visualized. Basilar diminutive with diffuse atheromatous irregularity but remains patent to its distal aspect, unchanged. Superior cerebral arteries patent bilaterally. Both of the posterior cerebral arteries primarily supplied via the basilar. PCAs demonstrate extensive atheromatous irregularity but are patent to their distal aspects. Venous sinuses: Grossly patent allowing for timing of the contrast bolus. Anatomic variants: None significant. Review of the MIP images confirms the above findings IMPRESSION: 1. Negative CTA for large vessel occlusion. 2. Moderate diffuse narrowing of the mid-distal cervical right ICA, new/progressed relative to previous CTA from 2019, and suspicious for possible interval dissection, age indeterminate. No raised dissection flap or significant luminal irregularity. 3. 65% atheromatous stenoses about the carotid bifurcations bilaterally. 4. Severe and extensive atherosclerotic change throughout the intracranial circulation, relatively unchanged from previous. Again, most notable findings include multifocal severe right A2 and V4 stenoses. 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 5. Short-segment severe  proximal right V1 stenosis. 6. Severe near  occlusive stenosis at the mid right subclavian artery. 7. Small layering bilateral pleural effusions with associated pulmonary interstitial edema. Electronically Signed   By: Rise Mu M.D.   On: 09/17/2019 04:40   Dg Forearm Left  Result Date: 09/17/2019 CLINICAL DATA:  History of prior distal radial fracture, follow-up exam EXAM: LEFT FOREARM - 2 VIEW COMPARISON:  06/29/2019 FINDINGS: Prior distal radial and ulnar fractures are again identified and stable. Intra-articular involvement is noted. No significant callus formation is noted. IMPRESSION: Stable appearance of distal radial and ulnar fractures. Electronically Signed   By: Alcide Clever M.D.   On: 09/17/2019 12:22   Ct Head Wo Contrast  Result Date: 09/17/2019 CLINICAL DATA:  Altered mental status EXAM: CT HEAD WITHOUT CONTRAST TECHNIQUE: Contiguous axial images were obtained from the base of the skull through the vertex without intravenous contrast. COMPARISON:  August 31, 2019 FINDINGS: Brain: Since the prior exam there is a increasing area of hypoattenuation within the right frontoparietal lobe, best seen on series 3, image 23. Again seen are changes of prior left MCA distribution infarct. There is no significant interval progression. There is dilatation the ventricles and sulci consistent with age-related atrophy. Low-attenuation changes in the deep white matter consistent with small vessel ischemia. No extra-axial collections. Vascular: No hyperdense vessel or unexpected calcification. Skull: The skull is intact. No fracture or focal lesion identified. Sinuses/Orbits: The visualized paranasal sinuses and mastoid air cells are clear. The orbits and globes intact. Other: None IMPRESSION: 1. worsening area of hypoattenuation in the right frontoparietal lobe, consistent evolution of infarct. 2. Stable changes of prior infarct involving the left MCA territory. 3. Findings consistent with age related atrophy and chronic small vessel  ischemia 4. These results were called by telephone at the time of interpretation on 09/17/2019 at 1:23 am to provider [facility] , who verbally acknowledged these results. Electronically Signed   By: Jonna Clark M.D.   On: 09/17/2019 01:24   Ct Angio Neck W Or Wo Contrast  Result Date: 09/17/2019 CLINICAL DATA:  Follow-up examination for acute stroke, left-sided weakness. EXAM: CT ANGIOGRAPHY HEAD AND NECK TECHNIQUE: Multidetector CT imaging of the head and neck was performed using the standard protocol during bolus administration of intravenous contrast. Multiplanar CT image reconstructions and MIPs were obtained to evaluate the vascular anatomy. Carotid stenosis measurements (when applicable) are obtained utilizing NASCET criteria, using the distal internal carotid diameter as the denominator. CONTRAST:  75mL OMNIPAQUE IOHEXOL 350 MG/ML SOLN COMPARISON:  Prior CT from earlier same day as well as previous CTA from 05/19/2018. FINDINGS: CTA NECK FINDINGS Aortic arch: Visualized aortic arch of normal caliber with normal 3 vessel morphology. Moderate atherosclerotic change about the arch and origin of the great vessels without hemodynamically significant stenosis. Extensive atherosclerotic change seen throughout the visualized left subclavian artery without high-grade stenosis. There is a severe near occlusive stenosis involving the mid right subclavian artery, grossly stable from previous (series 7, image 278). Right carotid system: Right CCA patent from its origin to the bifurcation without stenosis. Bulky calcified plaque about the right bifurcation with associated stenosis of up to 65% by NASCET criteria. Distally, the right ICA is somewhat irregular and attenuated, and diffusely narrowed as compared to the previous CTA, concerning for possible dissection (series 7, images 179-155). Finding is age indeterminate. No frank raised dissection flap visualized. Secondary moderate diffuse narrowing over the  affected segment. Right ICA otherwise patent at the skull base. Left carotid  system: Mild atheromatous stenosis at the origin of the left CCA. Left CCA otherwise widely patent to the bifurcation without significant stenosis. Bulky calcified plaque at the left bifurcation with associated stenosis of up to 65% by NASCET criteria. Superimposed small penetrating plaque noted at the proximal left ICA (series 7, image 213). Scattered multifocal centric plaque within the distal left ICA without significant stenosis. Left ICA otherwise patent to the skull base. Vertebral arteries: Both vertebral arteries arise from the subclavian arteries. Right vertebral artery dominant with a diffusely hypoplastic left vertebral artery. Focal plaque at the origin of the right vertebral artery with secondary severe stenosis, grossly stable from previous. Right vertebral otherwise widely patent to the skull base. Hypoplastic left vertebral artery severely diseased, and essentially occludes at the skull base, relatively stable from previous. Skeleton: No acute osseous finding. No discrete osseous lesions. Moderate cervical spondylosis at C4-5 through C6-7. Other neck: No other acute soft tissue abnormality within the neck. Upper chest: Few mildly enlarged right paratracheal and pretracheal nodes measure up to 13 mm, indeterminate, but could be reactive. Small layering bilateral pleural effusions with associated atelectasis. Diffuse interlobular septal thickening compatible with pulmonary interstitial edema. Subcentimeter calcified granuloma noted at the central aspect of the left upper lobe, stable. Review of the MIP images confirms the above findings CTA HEAD FINDINGS Anterior circulation: Petrous left ICA patent. Extensive atheromatous plaque throughout the left carotid siphon with associated moderate diffuse narrowing. A more focal severe stenosis again noted at the supraclinoid left ICA, stable (series 7, image 114). Left ICA terminus  perfused. Left M1 irregular but patent without high-grade stenosis. Negative left MCA bifurcation. Extensive small vessel atheromatous irregularity throughout the left MCA branches without large vessel occlusion. Previously seen left M3 occlusion has recanalized. Petrous right ICA patent. Extensive plaque throughout the right carotid siphon with moderate to severe multifocal narrowing, similar to previous. Right ICA terminus perfused. Right M1 irregular but patent and bifurcates early. Extensive atheromatous irregularity throughout the right MCA branches without proximal occlusion. A1 segments irregular but patent bilaterally. Right A1 hypoplastic. Normal anterior communicating artery. Extensive atheromatous change throughout the left A2 segment with up to moderate multifocal stenoses. Right A2 severely disease and attenuated with multifocal severe stenoses, although does remain patent to its distal aspect. Appearance is stable from previous. 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 Moderate atherosclerotic change throughout the dominant right V4 segment with associated moderate to severe multifocal stenoses, stable. Right PICA patent proximally. Left PICA not visualized. Basilar diminutive with diffuse atheromatous irregularity but remains patent to its distal aspect, unchanged. Superior cerebral arteries patent bilaterally. Both of the posterior cerebral arteries primarily supplied via the basilar. PCAs demonstrate extensive atheromatous irregularity but are patent to their distal aspects. Venous sinuses: Grossly patent allowing for timing of the contrast bolus. Anatomic variants: None significant. Review of the MIP images confirms the above findings IMPRESSION: 1. Negative CTA for large vessel occlusion. 2. Moderate diffuse narrowing of the mid-distal cervical right ICA, new/progressed relative to previous CTA from 2019, and suspicious for possible interval  dissection, age indeterminate. No raised dissection flap or significant luminal irregularity. 3. 65% atheromatous stenoses about the carotid bifurcations bilaterally. 4. Severe and extensive atherosclerotic change throughout the intracranial circulation, relatively unchanged from previous. Again, most notable findings include multifocal severe right A2 and V4 stenoses. 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 5. Short-segment severe proximal right V1 stenosis. 6. Severe near occlusive stenosis at the mid right  subclavian artery. 7. Small layering bilateral pleural effusions with associated pulmonary interstitial edema. Electronically Signed   By: Rise MuBenjamin  McClintock M.D.   On: 09/17/2019 04:40   Dg Hand 2 View Left  Result Date: 09/17/2019 CLINICAL DATA:  Pt brought in for multiple falls and left-sided weakness. AMS. Pt states hx of broken wrist APPROX.1 month ago. Pt c/o pain on entire left side. Pt unable to move arm at all, limited ROM with external rotation. Screaming with every touch. EXAM: The COMPARISON:  None. FINDINGS: Lucency through the distal radial metaphysis with disruption of the articular surface consistent with incomplete healing of fracture demonstrated on radiograph 06/29/2019. Fracture is intra-articular. No acute fracture in the wrist. Degenerative change at the first carpometacarpal joint. IMPRESSION: Incomplete healing of distal radial fracture. Electronically Signed   By: Genevive BiStewart  Edmunds M.D.   On: 09/17/2019 12:22   Dg Chest Port 1 View  Result Date: 09/16/2019 CLINICAL DATA:  Hypotension EXAM: PORTABLE CHEST 1 VIEW COMPARISON:  08/31/2019 FINDINGS: Mild cardiomegaly with aortic atherosclerosis. Streaky atelectasis at the bases. No consolidation, pleural effusion, or pneumothorax. IMPRESSION: 1. Mild cardiomegaly with minimal basilar atelectasis. Electronically Signed   By: Jasmine PangKim  Fujinaga M.D.   On: 09/16/2019 22:52   Dg Humerus Left  Result Date:  09/17/2019 CLINICAL DATA:  Left arm pain. EXAM: LEFT HUMERUS - 2+ VIEW COMPARISON:  Shoulder radiograph 06/04/2019 FINDINGS: Cortical margins of the humerus are intact. Humeral head osteophytes with advanced glenohumeral osteoarthritis demonstrated on prior shoulder exam. Cortical margins of the humerus are intact. There is no evidence of fracture or other focal bone lesions. Soft tissues are unremarkable. IMPRESSION: No acute findings. Advanced shoulder osteoarthritis, demonstrated on prior shoulder exam. Electronically Signed   By: Narda RutherfordMelanie  Sanford M.D.   On: 09/17/2019 00:42    PHYSICAL EXAM  Temp:  [98 F (36.7 C)-98.7 F (37.1 C)] 98.1 F (36.7 C) (10/24 0354) Pulse Rate:  [82-105] 101 (10/24 0354) Resp:  [14-18] 18 (10/24 0354) BP: (96-161)/(48-99) 161/82 (10/24 0354) SpO2:  [98 %-100 %] 98 % (10/24 0321)  General - Well nourished, well developed, in pain with left arm.  Awake, alert, disoriented to year, month, date, but knows city and place.  Non-fluent.  Comprehension, naming, repetition- mildly impaired.  Refuses strength testing due to pain especially in the left arm.    Generalized weakness.       ASSESSMENT/PLAN [name] is a 78 y.o. female with history of CAD, AF on AC, chronic pain on narcotics, prior stroke w/ residual aphasia on aspirin for intracranial atherosclerosis presenting with L sided weakness along with speech changes more than basleine.   Stroke:  Most likely right frontal infarct secondary to progressive right ICA stenosis in the setting of urosepsis  CT head worsening hypoattenuation R frontoparietal lobe c/w infarct evolution. Old L MCA territory infarct. Small vessel disease. Atrophy.   CTA head & neck no LVO. Moderate diffuse narrowing mid-distal cervical R ICA new since 2019 and suspicious for interval dissection. B ICA bifurcation 65% stenosis. Severe and extensive intracranial atherosclerosis (multifocal severe R A2 and V4, hypoplastic  L VA occluded). Severe proximal R V1 stenosis. Near occlusive stenosis R subclavian.   MRI  (unable to perform d/t foreign metal around eye)  Repeat CT head pending  2D Echo pending  LDL 41  HgbA1c 9.6  Eliquis for VTE prophylaxis  aspirin 81 mg daily and Eliquis (apixaban) daily prior to admission, now on aspirin 81 mg daily and Eliquis (apixaban) daily.  Therapy recommendations:  pending   Disposition:  pending   Vascular stenosis  04/2018 CTA head and neck - L M3 occlusion, b/l ICA bifurcation, L ICA siphon, R A2, R V4, L V3, R V1 and mid R subclavian.   This admission, CTA head & neck no LVO. Moderate diffuse narrowing mid-distal cervical R ICA new since 2019 and suspicious for interval dissection. B ICA bifurcation 65% stenosis. Severe and extensive intracranial atherosclerosis (multifocal severe R A2 and V4, hypoplastic L VA occluded). Severe proximal R V1 stenosis. Near occlusive stenosis R subclavian.  On eliquis and ASA 81  BP goal 130-150 given severe stenosis  Avoid low BP  Urosepsis  BP low on admission   Received multiple IV bolus  BP improved and then low again  Put on NS IVF @ 75  Encourage po intake  On cefepime, vanco and flagyl  Blood culture pending  Urine culture pending - UA WBC > 50  Treatment per primary team  Hx stroke/TIA  06/2018 admitted for tachycardia and found to have AF w/ RVR and LA thrombus. Put on eliquis and ASA 81  04/2018 L brain infarct. Found significant intra and extracranial atherosclerosis. L M3 occlusion felt to be culprit lesion. Put on DAPT but did not complete course.  Atrial Fibrillation  06/2018 admitted for tachycardia and found to have AF w/ RVR and LA thrombus. Put on eliquis.  Home anticoagulation:  Eliquis (apixaban) daily, continued in the hospital  BB on hold to allow for permissive HTN . Continue Eliquis (apixaban) daily at discharge   Hypotension Hx Hypertension  Low BP likely d/t  sepsis . Permissive hypertension (OK if < 220/120) but gradually normalize in 5-7 days . Monitor  . Long-term BP goal normotensive  Left arm pain  Recent fall  X-ray showed stable distal radial and ulnar fractures.   Pain management per primary team  Hyperlipidemia  Home meds:  lipitor 40, resumed in hospital  LDL 41, goal < 70  Continue statin at discharge  Diabetes type II Uncontrolled  HgbA1c 9.6, goal < 7.0  CBGs  SSI  Close PCP follow up for better DM control  DM coordinator consult can be considered  Other Stroke Risk Factors  Advanced age  Hx narcotic addiction.   Obesity, Body mass index is 30.07 kg/m., recommend weight loss, diet and exercise as appropriate   Coronary artery disease s/p PCI, stent  Chronic diastolic CHF  Hx LV thrombus  Other Active Problems  Hypothyroidism  Anxiety   Chronic issues w/ somnolence, sleep issues, Hx Malachi Carl (followed by Dr. Vickey Huger as an OP)  Hx Bell's Palsy w/ resultant R facial weakness  Chronic back pain   Hospital day # 1  Dr Roda Shutters 09/17/19     "I spent  35 minutes in total face-to-face time with the patient, more than 50% of which was spent in counseling and coordination of care, reviewing test results, images and medication, and discussing the diagnosis of stroke, cerebral multi-vessel severe athero, urosepsis, treatment plan and potential prognosis. This patient's care requiresreview of multiple databases, neurological assessment, discussion with family, other specialists and medical decision making of high complexity"    Possible right frontal infarct since 10/6 as hypodensity not present on CT at that time.  However, the degree of pain associated with the left side is rather unusual for a stroke.  She had been already on Eliquis and ASA when this happened.  Adding or changing to another antiplatelet would dramatically increase risk  of hemorrhage.  Continue treating UTI.  BP is stable.    Rogue Jury, MS, MD 09/18/2019 6:59 AM      To contact Stroke Continuity provider, please refer to [email or web address] After hours, contact General Neurology

## 2019-09-19 DIAGNOSIS — R531 Weakness: Secondary | ICD-10-CM | POA: Diagnosis not present

## 2019-09-19 LAB — CBC
HCT: 31.3 % — ABNORMAL LOW (ref 36.0–46.0)
Hemoglobin: 10.3 g/dL — ABNORMAL LOW (ref 12.0–15.0)
MCH: 27 pg (ref 26.0–34.0)
MCHC: 32.9 g/dL (ref 30.0–36.0)
MCV: 82.2 fL (ref 80.0–100.0)
Platelets: 272 10*3/uL (ref 150–400)
RBC: 3.81 MIL/uL — ABNORMAL LOW (ref 3.87–5.11)
RDW: 14.6 % (ref 11.5–15.5)
WBC: 7.5 10*3/uL (ref 4.0–10.5)
nRBC: 0 % (ref 0.0–0.2)

## 2019-09-19 LAB — COMPREHENSIVE METABOLIC PANEL
ALT: 23 U/L (ref 0–44)
AST: 22 U/L (ref 15–41)
Albumin: 2.7 g/dL — ABNORMAL LOW (ref 3.5–5.0)
Alkaline Phosphatase: 84 U/L (ref 38–126)
Anion gap: 8 (ref 5–15)
BUN: 16 mg/dL (ref 8–23)
CO2: 22 mmol/L (ref 22–32)
Calcium: 9.1 mg/dL (ref 8.9–10.3)
Chloride: 108 mmol/L (ref 98–111)
Creatinine, Ser: 0.89 mg/dL (ref 0.44–1.00)
GFR calc Af Amer: >60 mL/min (ref >60)
GFR calc non Af Amer: >60 mL/min (ref >60)
Glucose, Bld: 168 mg/dL — ABNORMAL HIGH (ref 70–99)
Potassium: 3.6 mmol/L (ref 3.5–5.1)
Sodium: 138 mmol/L (ref 135–145)
Total Bilirubin: 0.8 mg/dL (ref 0.3–1.2)
Total Protein: 5.4 g/dL — ABNORMAL LOW (ref 6.5–8.1)

## 2019-09-19 LAB — GLUCOSE, CAPILLARY
Glucose-Capillary: 153 mg/dL — ABNORMAL HIGH (ref 70–99)
Glucose-Capillary: 179 mg/dL — ABNORMAL HIGH (ref 70–99)

## 2019-09-19 LAB — MAGNESIUM: Magnesium: 1.9 mg/dL (ref 1.7–2.4)

## 2019-09-19 MED ORDER — CEPHALEXIN 500 MG PO CAPS: 500.0000 mg | ORAL_CAPSULE | Freq: Four times a day (QID) | ORAL | Status: DC

## 2019-09-19 MED ORDER — CEPHALEXIN 500 MG PO CAPS
500.0000 mg | ORAL_CAPSULE | Freq: Three times a day (TID) | ORAL | Status: DC
  Administered 2019-09-20 – 2019-09-24 (×13): 500 mg via ORAL
  Filled 2019-09-19 (×13): qty 1

## 2019-09-19 NOTE — Progress Notes (Signed)
Paged on call Dr. Kennon Holter about bp of 159/109; no new orders at this time

## 2019-09-19 NOTE — Progress Notes (Addendum)
STROKE TEAM PROGRESS NOTE   INTERVAL HISTORY  No major changes.  CT Brain yesterday showed right frontal hypodensity and left parietal encephalomalacia. CTA - bilateral ICA stenosis   Vitals:   09/19/19 0035 09/19/19 0354 09/19/19 0736 09/19/19 1146  BP: (!) 159/109 (!) 163/91 120/80 (!) 140/92  Pulse: 95 95 91 (!) 104  Resp: 18 17 18 16   Temp: 98.4 F (36.9 C) 98.3 F (36.8 C) 97.6 F (36.4 C) 98.6 F (37 C)  TempSrc: Oral Oral Oral Oral  SpO2:  98% 98% 99%  Weight:      Height:        CBC:  Recent Labs  Lab 09/18/19 0307 09/19/19 0324  WBC 7.6 7.5  HGB 10.9* 10.3*  HCT 33.0* 31.3*  MCV 81.7 82.2  PLT 294 272    Basic Metabolic Panel:  Recent Labs  Lab 09/18/19 0307 09/19/19 0324  NA 139 138  K 3.5 3.6  CL 110 108  CO2 20* 22  GLUCOSE 140* 168*  BUN 19 16  CREATININE 1.08* 0.89  CALCIUM 9.4 9.1  MG 1.8 1.9   Lipid Panel:     Component Value Date/Time   CHOL 92 09/17/2019 0245   CHOL 346 (H) 09/23/2017 1803   TRIG 99 09/17/2019 0245   HDL 31 (L) 09/17/2019 0245   HDL 56 09/23/2017 1803   CHOLHDL 3.0 09/17/2019 0245   VLDL 20 09/17/2019 0245   LDLCALC 41 09/17/2019 0245   LDLCALC Comment 09/23/2017 1803   HgbA1c:  Lab Results  Component Value Date   HGBA1C 9.6 (H) 09/17/2019   Urine Drug Screen:     Component Value Date/Time   LABOPIA POSITIVE (A) 07/02/2018 2208   COCAINSCRNUR NONE DETECTED 07/02/2018 2208   LABBENZ POSITIVE (A) 07/02/2018 2208   AMPHETMU NONE DETECTED 07/02/2018 2208   THCU NONE DETECTED 07/02/2018 2208   LABBARB NONE DETECTED 07/02/2018 2208    Alcohol Level     Component Value Date/Time   ETH <10 05/19/2018 0237    IMAGING  Ct Head Wo Contrast 09/18/2019 IMPRESSION:  1. Little interval change in size and appearance of evolving subacute right MCA territory infarct. Subtle serpiginous hyperdensity within the area of infarction most consistent with faint petechial hemorrhage or possibly evolving laminar  necrosis. No evidence for hemorrhagic transformation.  2. Otherwise stable appearance of the brain with additional chronic ischemic changes as above. No other new acute intracranial abnormality.    Transthoracic Echocardiogram  09/17/2019 IMPRESSIONS  1. Left ventricular ejection fraction, by visual estimation, is 65-70%. The left ventricle has normal function. Normal left ventricular size. Left ventricular septal wall thickness was severely increased. Severely increased left ventricular posterior wall thickness.  2. Definity contrast agent was given IV to delineate the left ventricular endocardial borders.  3. Left ventricular diastolic Doppler parameters are indeterminate pattern of LV diastolic filling. The left ventricular diastology could not be evaluated secondary to atrial fibrillation. .  4. There is a dynamic mid LV cavitary gradient measuring 09/19/2019 at rest.  5. Global right ventricle has normal systolic function.The right ventricular size is normal. No increase in right ventricular wall thickness.  6. Left atrial size was mildly dilated.  7. Right atrial size was normal.  8. Mild to moderate mitral annular calcification.. Trace mitral valve regurgitation. No evidence of mitral stenosis.  9. The tricuspid valve is normal in structure. Tricuspid valve regurgitation is trivial. 10. The aortic valve was not well visualized. Aortic valve regurgitation was not visualized by  color flow Doppler. Structurally normal aortic valve, with no evidence of sclerosis or stenosis. 11. The pulmonic valve was normal in structure. Pulmonic valve regurgitation is not visualized by color flow Doppler. 12. Normal pulmonary artery systolic pressure. 13. The inferior vena cava is normal in size with greater than 50% respiratory variability, suggesting right atrial pressure of 3 mmHg.   PHYSICAL EXAM  Temp:  [97.4 F (36.3 C)-98.6 F (37 C)] 98.6 F (37 C) (10/25 1146) Pulse Rate:  [91-112] 104 (10/25  1146) Resp:  [16-18] 16 (10/25 1146) BP: (120-163)/(80-116) 140/92 (10/25 1146) SpO2:  [97 %-99 %] 99 % (10/25 1146)  General - Well nourished, well developed, in pain with left arm.  Awake, alert, disoriented to year, month, date, but knows city and place.  Non-fluent.  Comprehension, naming, repetition- mildly impaired.  Refuses strength testing due to pain especially in the left arm.    Generalized weakness.       ASSESSMENT/PLAN [name] is a 78 y.o. female with history of CAD, AF on AC, chronic pain on narcotics, prior stroke w/ residual aphasia on aspirin for intracranial atherosclerosis presenting with L sided weakness along with speech changes more than basleine.   Stroke:  Most likely right frontal infarct secondary to progressive right ICA stenosis in the setting of urosepsis  CT head worsening hypoattenuation R frontoparietal lobe c/w infarct evolution. Old L MCA territory infarct. Small vessel disease. Atrophy.   CTA head & neck no LVO. Moderate diffuse narrowing mid-distal cervical R ICA new since 2019 and suspicious for interval dissection. B ICA bifurcation 65% stenosis. Severe and extensive intracranial atherosclerosis (multifocal severe R A2 and V4, hypoplastic L VA occluded). Severe proximal R V1 stenosis. Near occlusive stenosis R subclavian.   MRI  (unable to perform d/t foreign metal around eye)  Repeat CT head - Little interval change in size and appearance of evolving subacute right MCA territory infarct. Subtle serpiginous hyperdensity within the area of infarction most consistent with faint petechial hemorrhage or possibly evolving laminar necrosis. No evidence for hemorrhagic transformation.   2D Echo - EF 65 - 70%. No cardiac source of emboli identified.   LDL 41  HgbA1c 9.6  Eliquis for VTE prophylaxis  aspirin 81 mg daily and Eliquis (apixaban) daily prior to admission, now on aspirin 81 mg daily and Eliquis (apixaban) daily.   Therapy  recommendations: SNF  Disposition:  pending   Vascular stenosis  04/2018 CTA head and neck - L M3 occlusion, b/l ICA bifurcation, L ICA siphon, R A2, R V4, L V3, R V1 and mid R subclavian.   This admission, CTA head & neck no LVO. Moderate diffuse narrowing mid-distal cervical R ICA new since 2019 and suspicious for interval dissection. B ICA bifurcation 65% stenosis. Severe and extensive intracranial atherosclerosis (multifocal severe R A2 and V4, hypoplastic L VA occluded). Severe proximal R V1 stenosis. Near occlusive stenosis R subclavian.  On eliquis and ASA 81  BP goal 130-150 given severe stenosis  Avoid low BP  Urosepsis  BP low on admission   Received multiple IV bolus  BP improved and then low again  Put on NS IVF @ 75  Encourage po intake  On cefepime, vanco and flagyl  Blood culture - no growth x 2 days  Urine culture - insignificant growth - UA WBC > 50  Treatment per primary team  Hx stroke/TIA  06/2018 admitted for tachycardia and found to have AF w/ RVR and LA thrombus. Put on  eliquis and ASA 81  04/2018 L brain infarct. Found significant intra and extracranial atherosclerosis. L M3 occlusion felt to be culprit lesion. Put on DAPT but did not complete course.  Atrial Fibrillation  06/2018 admitted for tachycardia and found to have AF w/ RVR and LA thrombus. Put on eliquis.  Home anticoagulation:  Eliquis (apixaban) daily, continued in the hospital  BB on hold to allow for permissive HTN . Continue Eliquis (apixaban) daily at discharge   Hypotension Hx Hypertension  Low BP likely d/t sepsis . Permissive hypertension (OK if < 220/120) but gradually normalize in 5-7 days . Monitor  . Long-term BP goal normotensive  Left arm pain  Recent fall  X-ray showed stable distal radial and ulnar fractures.   Pain management per primary team  Hyperlipidemia  Home meds:  lipitor 40, resumed in hospital  LDL 41, goal < 70  Continue statin at  discharge  Diabetes type II Uncontrolled  HgbA1c 9.6, goal < 7.0  CBGs  SSI  Close PCP follow up for better DM control  DM coordinator consult can be considered  Other Stroke Risk Factors  Advanced age  Hx narcotic addiction.   Obesity, Body mass index is 30.07 kg/m., recommend weight loss, diet and exercise as appropriate   Coronary artery disease s/p PCI, stent  Chronic diastolic CHF  Hx LV thrombus  Other Active Problems  Hypothyroidism  Anxiety   Chronic issues w/ somnolence, sleep issues, Hx [name] Barr (followed by Dr. Vickey Williams as an OP)  Hx Bell's Palsy w/ resultant R facial weakness  Chronic back pain   Hospital day # 2  Dr Roda ShuttersXu 09/17/19     "I spent  35 minutes in total face-to-face time with the patient, more than 50% of which was spent in counseling and coordination of care, reviewing test results, images and medication, and discussing the diagnosis of stroke, cerebral multi-vessel severe athero, urosepsis, treatment plan and potential prognosis. This patient's care requiresreview of multiple databases, neurological assessment, discussion with family, other specialists and medical decision making of high complexity"    Possible right frontal infarct since 10/6 as hypodensity not present on CT at that time.  However, the degree of pain associated with the left side is rather unusual for a stroke.  She had been already on Eliquis and ASA when this happened.  Adding or changing to another antiplatelet would dramatically increase risk of hemorrhage.  Continue treating UTI.  BP is stable.  Awaiting SNF placement. Will sign off.   Weston SettleShervin Chapin Arduini, MS, MD 09/19/2019 1:10 PM      To contact Stroke Continuity provider, please refer to [email or web address]. After hours, contact General Neurology

## 2019-09-19 NOTE — Progress Notes (Addendum)
PROGRESS NOTE    [name]  HYW:[id number] DOB: 09-15-1941 DOA: 09/16/2019 PCP: Horald Pollen, MD   Brief Narrative: [name] is [name] 78 y.o. 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  Patient unable to provide history and does not know why she is in the hospital.  No family at bedside. Per EDP report, daughter noticed that about [name] week ago she seemed to have left-sided neglect and some slurred speech.  Facility reportedly does not feel that she has any deviations from her baseline but requested ED eval due to family concerns.  Patient denies any frank pain but was intolerant of having her left shoulder examined or be turned over for respiratory exam.  She was recently evaluated in the ED last week for [name] fall.  ED Course:  She was afebrile and hypotensive down to 80s over 60 which improved after 3 L of fluid resuscitation up to 150s over 90.  CBC showed no leukocytosis or anemia.  CMP showed mildly elevated creatinine of 1.48 from Rayona Sardinha prior of 1.09.  Lactate of 2.2.  Urinalysis shows large leukocyte, negative nitrite and many WBC.   Assessment & Plan:   Active Problems:   Diabetes mellitus (HCC)   Chronic diastolic (congestive) heart failure (HCC)   Atrial fibrillation, chronic (HCC)   Hypotension   Cerebral thrombosis with cerebral infarction   Right frontoparietal lobe infarct - Discussed CT finding with Neurology Dr. Rory Williams - believes new stroke likely sometime between last CT head on 10/6 and this admission.  - per neurology, suspect R frontal infarct 2/2 progressive R ICA stenosis in setting of urosepsis - Pt unable to get MRI brain due to foreign metal object in the past  - will obtain CTA head and neck -  moderate diffuse narrowing of mid distal cervical R ICA (new/progressed from CTA from 2019 and concerning for possible interval dissection), 65% atheromatous stenoses about carotid bifurcations bilaterally - severe and extensive atherosclerotic change throughout intracranial circulation (multifocal severe R A1 and V4 stenoses), short segment severe proximal R V1 stenosis.  Severe near occlsuive stenosis at the mid right subclavian artery.  Small layering bilateral pleural effusions. - unable to obtain MRI - repeat head CT with little interval change in size and appearance of evolving subacute R MCA territory infarct. - echocardiogram - EF 65-70% (see report) - continue Eliquis and aspirin  - Obtain A1c (9.6) and lipids (LDL 41, HDL 31) - PT/OT/SLT - Frequent neuro checks and keep on telemetry - Avoid low BP with severe stenosis (goal BP 130-150 given severe stenosis, continue IVF and follow - per neurology, adding or changing to another antiplatelet would increase risk of hemorrhage - recommended treat UTI.  Plan for SNF.  Left sided weakness - multifactorial from new right sided infarct and recent fall -pt intolerant of movement to left upper extremity due to left shoulder pain - she c/o pain all over, difficult to tell significance of this - Left shoulder X-ray negative. L forearm and hand films negative for acute findings - incomplete healing of distal radial fx seen on 06/2019, will touch base with orthopedics in AM - of note, her husband notes chronic pain since this was initially seen [name] few months ago - ortho recommended wrist splint, will need outpatient f/u  Possible Sepsis - Urine with large LE, rare bacteria, negative nitrite, RBC's and >50 WBC - blood cx NGTD and urine cx with insignificant growth (of note, urine cx collected after abx) - CXR with minimal basilar atelectasis - Started on vanc/cefepime - d/c vanc and cefepime -> narrowed to ceftriaxone today.  Will complete 7 day  course with keflex.  Hypotension likely secondary to sepsis  - improved with 3L fluid resuscitation, continue IVF - Continue to closely monitor to avoid any further hypotension especially in the setting of new infarct - hold antihypertensives - BP improved today  AKI: 2/2 above, improved  Atrial fibrillation -Currently rate controlled. -Continue Eliquis -Hold beta-blocker for permissive hypertension in the setting of new infarct  Type 2 diabetes -Hemoglobin A1c of 11.5 few weeks ago -Normally on 30 units of Lantus at bedtime - start on 20 units Lantus qHS and moderate SSI  Chronic diastolic CHF -Stable.  Patient is hypovolemic with improved blood pressure s/p fluid resuscitation.  Hypothyroidism -Continue levothyroxine  Anxiety - continue Effexor   DVT prophylaxis: eliquis Code Status: full  Family Communication: none at bedside - husband over phone - called daughter, no answer x2 Disposition Plan: pending further improvement   Consultants:   neurology  Procedures:  Echo IMPRESSIONS    1. Left ventricular ejection fraction, by visual estimation, is 65-70%. The left ventricle has normal function. Normal left ventricular size. Left ventricular septal wall thickness was severely increased. Severely increased left ventricular posterior  wall thickness.  2. Definity contrast agent was given IV to delineate the left ventricular endocardial borders.  3. Left ventricular diastolic Doppler parameters are indeterminate pattern of LV diastolic filling. The left ventricular diastology could not be evaluatedsecondary to atrial fibrillation. .  4. There is [name] dynamic mid LV cavitary gradient measuring at rest.  5. Global right ventricle has normal systolic function.The right ventricular size is normal. No increase in right ventricular wall thickness.  6. Left atrial size was mildly dilated.  7. Right atrial size was normal.  8. Mild to moderate mitral annular  calcification.. Trace mitral valve regurgitation. No evidence of mitral stenosis.  9. The tricuspid valve is normal in structure. Tricuspid valve regurgitation is trivial. 10. The aortic valve was not well visualized. Aortic valve regurgitation was not visualized by color flow Doppler. Structurally normal aortic valve, with no evidence of sclerosis or stenosis. 11. The pulmonic valve was normal in structure. Pulmonic valve regurgitation is not visualized by color flow Doppler. 12. Normal pulmonary artery systolic pressure. 13. The inferior vena cava is normal in size with greater than 50% respiratory variability, suggesting right atrial pressure of 3 mmHg.  Antimicrobials:  Anti-infectives (From admission, onward)   Start     Dose/Rate Route Frequency Ordered Stop   09/19/19 0900  cefTRIAXone (ROCEPHIN) 2 g in sodium chloride 0.9 % 100 mL IVPB     2 g 200 mL/hr over 30 Minutes Intravenous Every 24 hours 09/18/19 1620     09/17/19 2200  vancomycin (VANCOCIN) IVPB 1000 mg/200 mL premix  Status:  Discontinued     1,000 mg 200 mL/hr over 60 Minutes Intravenous Every 24 hours 09/16/19 2241 09/18/19 1607   09/17/19 2000  ceFEPIme (MAXIPIME) 2 g in sodium chloride 0.9 % 100 mL IVPB  Status:  Discontinued     2 g 200 mL/hr over 30 Minutes Intravenous Every 24 hours 09/16/19 2241 09/17/19 0843   09/17/19 1000  ceFEPIme (MAXIPIME) 2 g in sodium chloride 0.9 % 100  mL IVPB  Status:  Discontinued     2 g 200 mL/hr over 30 Minutes Intravenous Every 12 hours 09/17/19 0843 09/18/19 1607   09/16/19 2330  vancomycin (VANCOCIN) 1,500 mg in sodium chloride 0.9 % 500 mL IVPB     1,500 mg 250 mL/hr over 120 Minutes Intravenous  Once 09/16/19 2315 09/17/19 0304   09/16/19 2245  vancomycin (VANCOCIN) 500 mg in sodium chloride 0.9 % 100 mL IVPB  Status:  Discontinued     500 mg 100 mL/hr over 60 Minutes Intravenous  Once 09/16/19 2241 09/16/19 2315   09/16/19 2230  ceFEPIme (MAXIPIME) 2 g in sodium chloride 0.9  % 100 mL IVPB     2 g 200 mL/hr over 30 Minutes Intravenous  Once 09/16/19 2216 09/17/19 0028   09/16/19 2230  metroNIDAZOLE (FLAGYL) IVPB 500 mg     500 mg 100 mL/hr over 60 Minutes Intravenous  Once 09/16/19 2216 09/17/19 0052   09/16/19 2230  vancomycin (VANCOCIN) IVPB 1000 mg/200 mL premix  Status:  Discontinued     1,000 mg 200 mL/hr over 60 Minutes Intravenous  Once 09/16/19 2216 09/16/19 2315     Subjective: Doing ok Says she's cold  Objective: Vitals:   09/19/19 0035 09/19/19 0354 09/19/19 0736 09/19/19 1146  BP: (!) 159/109 (!) 163/91 120/80 (!) 140/92  Pulse: 95 95 91 (!) 104  Resp: Temp: 98.4 F (36.9 C) 98.3 F (36.8 C) 97.6 F (36.4 C) 98.6 F (37 C)  TempSrc: Oral Oral Oral Oral  SpO2:  98% 98% 99%  Weight:      Height:        Intake/Output Summary (Last 24 hours) at 09/19/2019 1621 Last data filed at 09/19/2019 0855 Gross per 24 hour  Intake 1113.61 ml  Output 900 ml  Net 213.61 ml   Filed Weights   09/17/19 0200  Weight: 77 kg    Examination:  General: No acute distress. Cardiovascular: Heart sounds show [name] regular rate, and rhythm Lungs: Clear to auscultation bilaterally  Abdomen: Soft, nontender, nondistended  Neurological: Alert, but confused. Does not actively participate with LUE due to pain.  LLE seems weaker, but improved. Cranial nerves II through XII grossly intact. Skin: Warm and dry. No rashes or lesions. Extremities: No clubbing or cyanosis. No edema.  Data Reviewed: I have personally reviewed following labs and imaging studies  CBC: Recent Labs  Lab 09/16/19 2110 09/17/19 0245 09/18/19 0307 09/19/19 0324  WBC 9.8 10.5 7.6 7.5  HGB 13.3 10.8* 10.9* 10.3*  HCT 41.6 34.7* 33.0* 31.3*  MCV 83.5 85.3 81.7 82.2  PLT 366 295 294 272   Basic Metabolic Panel: Recent Labs  Lab 09/16/19 2110 09/17/19 0245 09/18/19 0307 09/19/19 0324  NA 134* 138 139 138  K 4.1 3.5 3.5 3.6  CL 99 113* 110 108  CO2 21* 19* 20*  22  GLUCOSE 192* 108* 140* 168*  BUN 40* 31* 19 16  CREATININE 1.48* 1.05* 1.08* 0.89  CALCIUM 9.9 7.9* 9.4 9.1  MG  --   --  1.8 1.9   GFR: Estimated Creatinine Clearance: 50.3 mL/min (by C-G formula based on SCr of 0.89 mg/dL). Liver Function Tests: Recent Labs  Lab 09/16/19 2110 09/18/19 0307 09/19/19 0324  AST 41 24 22  ALT 42 27 23  ALKPHOS 109 87 84  BILITOT 0.6 0.6 0.8  PROT 7.0 6.0* 5.4*  ALBUMIN 3.5 2.8* 2.7*   No results for input(s): LIPASE, AMYLASE in  the last 168 hours. No results for input(s): AMMONIA in the last 168 hours. Coagulation Profile: Recent Labs  Lab 09/16/19 2300  INR 1.3*   Cardiac Enzymes: No results for input(s): CKTOTAL, CKMB, CKMBINDEX, TROPONINI in the last 168 hours. BNP (last 3 results) No results for input(s): PROBNP in the last 8760 hours. HbA1C: Recent Labs    09/17/19 0245  HGBA1C 9.6*   CBG: Recent Labs  Lab 09/18/19 0617 09/18/19 1246 09/18/19 1617 09/18/19 2152 09/19/19 0629  GLUCAP 126* 209* 224* 209* 153*   Lipid Profile: Recent Labs    09/17/19 0245  CHOL 92  HDL 31*  LDLCALC 41  TRIG 99  CHOLHDL 3.0   Thyroid Function Tests: Recent Labs    09/17/19 0112  TSH 0.499  FREET4 1.30*   Anemia Panel: No results for input(s): VITAMINB12, FOLATE, FERRITIN, TIBC, IRON, RETICCTPCT in the last 72 hours. Sepsis Labs: Recent Labs  Lab 09/16/19 2205 09/16/19 2300  LATICACIDVEN 2.2* 2.0*    Recent Results (from the past 240 hour(s))  Blood Culture (routine x 2)     Status: None (Preliminary result)   Collection Time: 09/16/19 10:22 PM   Specimen: BLOOD LEFT FOREARM  Result Value Ref Range Status   Specimen Description BLOOD LEFT FOREARM  Final   Special Requests   Final    BOTTLES DRAWN AEROBIC AND ANAEROBIC Blood Culture results may not be optimal due to an excessive volume of blood received in culture bottles   Culture   Final    NO GROWTH 3 DAYS Performed at [facility], [address]  [place], [place] [address]    Report Status PENDING  Incomplete  Blood Culture (routine x 2)     Status: None (Preliminary result)   Collection Time: 09/16/19 10:22 PM   Specimen: BLOOD RIGHT HAND  Result Value Ref Range Status   Specimen Description BLOOD RIGHT HAND  Final   Special Requests   Final    BOTTLES DRAWN AEROBIC AND ANAEROBIC Blood Culture results may not be optimal due to an inadequate volume of blood received in culture bottles   Culture   Final    NO GROWTH 3 DAYS Performed at [facility], [address] [place], [place] [address]    Report Status PENDING  Incomplete  SARS CORONAVIRUS 2 (TAT 6-24 HRS) Nasopharyngeal Nasopharyngeal Swab     Status: None   Collection Time: 09/16/19 11:56 PM   Specimen: Nasopharyngeal Swab  Result Value Ref Range Status   SARS Coronavirus 2 NEGATIVE NEGATIVE Final    Comment: (NOTE) SARS-CoV-2 target nucleic acids are NOT DETECTED. The SARS-CoV-2 RNA is generally detectable in upper and lower respiratory specimens during the acute phase of infection. Negative results do not preclude SARS-CoV-2 infection, do not rule out co-infections with other pathogens, and should not be used as the sole basis for treatment or other patient management decisions. Negative results must be combined with clinical observations, patient history, and epidemiological information. The expected result is Negative. Fact Sheet for Patients: HairSlick.nohttps://www.fda.gov/media/138098/download Fact Sheet for Healthcare Providers: quierodirigir.comhttps://www.fda.gov/media/138095/download This test is not yet approved or cleared by the [place] FDA and  has been authorized for detection and/or diagnosis of SARS-CoV-2 by FDA under an Emergency Use Authorization (EUA). This EUA will remain  in effect (meaning this test can be used) for the duration of the COVID-19 declaration under Section 56 4(b)(1) of the Act, 21 U.S.C. section 360bbb-3(b)(1), unless the authorization is  terminated or revoked  sooner. Performed at [facility], [address] [place], [place] [address]   Urine culture     Status: Abnormal   Collection Time: 09/17/19  1:08 AM   Specimen: Urine, Random  Result Value Ref Range Status   Specimen Description URINE, RANDOM  Final   Special Requests NONE  Final   Culture ([name])  Final    <10,000 COLONIES/mL INSIGNIFICANT GROWTH Performed at [facility], [address] [place], [place] [address]    Report Status 09/18/2019 FINAL  Final  MRSA PCR Screening     Status: None   Collection Time: 09/17/19  9:57 PM   Specimen: Nasal Mucosa; Nasopharyngeal  Result Value Ref Range Status   MRSA by PCR NEGATIVE NEGATIVE Final    Comment:        The GeneXpert MRSA Assay (FDA approved for NASAL specimens only), is one component of [name] comprehensive MRSA colonization surveillance program. It is not intended to diagnose MRSA infection nor to guide or monitor treatment for MRSA infections. Performed at [facility], [address] [place], [place] [address]          Radiology Studies: Ct Head Wo Contrast  Result Date: 09/18/2019 CLINICAL DATA:  Follow-up examination for recent stroke. EXAM: CT HEAD WITHOUT CONTRAST TECHNIQUE: Contiguous axial images were obtained from the base of the skull through the vertex without intravenous contrast. COMPARISON:  Prior CT from 09/17/2019. FINDINGS: Brain: Examination degraded by motion artifact. Evolving subacute right MCA territory infarct involving the right frontal lobe is relatively stable in size and distribution from previous. Few scattered subtle areas of hyperdensity within the area of infarction likely reflect petechial hemorrhage and/or evolving laminar necrosis, stable. No frank hemorrhagic transformation or significant mass effect. No other new acute large vessel territory infarct. Chronic posterior left MCA territory infarction again noted. Underlying atrophy with chronic small  vessel ischemic disease. No acute intracranial hemorrhage. No mass lesion, mass effect, or midline shift. No hydrocephalus. No extra-axial fluid collection. Vascular: No hyperdense vessel. Calcified atherosclerosis at the skull base. Skull: Scalp soft tissues and calvarium within normal limits. Sinuses/Orbits: Globes and orbital soft tissues within normal limits. Chronic right sphenoid sinusitis noted. Paranasal sinuses are otherwise clear. No mastoid effusion. Other: None. IMPRESSION: 1. Little interval change in size and appearance of evolving subacute right MCA territory infarct. Subtle serpiginous hyperdensity within the area of infarction most consistent with faint petechial hemorrhage or possibly evolving laminar necrosis. No evidence for hemorrhagic transformation. 2. Otherwise stable appearance of the brain with additional chronic ischemic changes as above. No other new acute intracranial abnormality. Electronically Signed   By: Rise Mu M.D.   On: 09/18/2019 07:03        Scheduled Meds:  apixaban  5 mg Oral BID   aspirin EC  81 mg Oral Daily   atorvastatin  40 mg Oral q1800   insulin aspart  0-15 Units Subcutaneous TID WC   insulin glargine  20 Units Subcutaneous QHS   levothyroxine  50 mcg Oral QAC breakfast   magnesium oxide  800 mg Oral BID   mouth rinse  15 mL Mouth Rinse BID   polyvinyl alcohol  1 drop Both Eyes QID   potassium chloride  10 mEq Oral Daily   venlafaxine XR  150 mg Oral Q breakfast   Continuous Infusions:  sodium chloride Stopped (09/18/19 0953)   cefTRIAXone (ROCEPHIN)  IV 2 g (09/19/19 0842)     LOS: 2 days  Time spent: over 30 min    Kathleen Nicks, MD [facility] Pager AMION  If 7PM-7AM, please contact night-coverage [email or web address] Password [email or web address] 09/19/2019, 4:21 PM

## 2019-09-20 DIAGNOSIS — I6330 Cerebral infarction due to thrombosis of unspecified cerebral artery: Secondary | ICD-10-CM | POA: Diagnosis not present

## 2019-09-20 DIAGNOSIS — R531 Weakness: Secondary | ICD-10-CM | POA: Diagnosis not present

## 2019-09-20 LAB — CBC
HCT: 30.9 % — ABNORMAL LOW (ref 36.0–46.0)
Hemoglobin: 10.1 g/dL — ABNORMAL LOW (ref 12.0–15.0)
MCH: 27.2 pg (ref 26.0–34.0)
MCHC: 32.7 g/dL (ref 30.0–36.0)
MCV: 83.1 fL (ref 80.0–100.0)
Platelets: 263 10*3/uL (ref 150–400)
RBC: 3.72 MIL/uL — ABNORMAL LOW (ref 3.87–5.11)
RDW: 14.6 % (ref 11.5–15.5)
WBC: 8.5 10*3/uL (ref 4.0–10.5)
nRBC: 0 % (ref 0.0–0.2)

## 2019-09-20 LAB — COMPREHENSIVE METABOLIC PANEL WITH GFR
ALT: 21 U/L (ref 0–44)
AST: 25 U/L (ref 15–41)
Albumin: 2.7 g/dL — ABNORMAL LOW (ref 3.5–5.0)
Alkaline Phosphatase: 79 U/L (ref 38–126)
Anion gap: 10 (ref 5–15)
BUN: 10 mg/dL (ref 8–23)
CO2: 19 mmol/L — ABNORMAL LOW (ref 22–32)
Calcium: 8.7 mg/dL — ABNORMAL LOW (ref 8.9–10.3)
Chloride: 109 mmol/L (ref 98–111)
Creatinine, Ser: 0.69 mg/dL (ref 0.44–1.00)
GFR calc Af Amer: >60 mL/min
GFR calc non Af Amer: >60 mL/min
Glucose, Bld: 141 mg/dL — ABNORMAL HIGH (ref 70–99)
Potassium: 3.6 mmol/L (ref 3.5–5.1)
Sodium: 138 mmol/L (ref 135–145)
Total Bilirubin: 0.6 mg/dL (ref 0.3–1.2)
Total Protein: 5.6 g/dL — ABNORMAL LOW (ref 6.5–8.1)

## 2019-09-20 LAB — SARS CORONAVIRUS 2 (TAT 6-24 HRS): SARS Coronavirus 2: NEGATIVE

## 2019-09-20 LAB — GLUCOSE, CAPILLARY
Glucose-Capillary: 159 mg/dL — ABNORMAL HIGH (ref 70–99)
Glucose-Capillary: 193 mg/dL — ABNORMAL HIGH (ref 70–99)

## 2019-09-20 LAB — MAGNESIUM: Magnesium: 1.8 mg/dL (ref 1.7–2.4)

## 2019-09-20 MED ORDER — METOPROLOL TARTRATE 12.5 MG HALF TABLET
12.5000 mg | ORAL_TABLET | Freq: Two times a day (BID) | ORAL | Status: DC
  Administered 2019-09-20 – 2019-09-21 (×3): 12.5 mg via ORAL
  Filled 2019-09-20 (×3): qty 1

## 2019-09-20 MED ORDER — POLYETHYLENE GLYCOL 3350 17 G PO PACK
17.0000 g | PACK | Freq: Two times a day (BID) | ORAL | Status: DC
  Administered 2019-09-20 – 2019-09-25 (×9): 17 g via ORAL
  Filled 2019-09-20 (×12): qty 1

## 2019-09-20 NOTE — Progress Notes (Addendum)
PROGRESS NOTE    [name]  ZOX:[id number] DOB: [birth year] DOA: 09/16/2019 PCP: Georgina Quint, MD   Brief Narrative: [name] is [name] 78 y.o. 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  Patient unable to provide history and does not know why she is in the hospital.  No family at bedside. Per EDP report, daughter noticed that about Darnell Stimson week ago she seemed to have left-sided neglect and some slurred speech.  Facility reportedly does not feel that she has any deviations from her baseline but requested ED eval due to family concerns.  Patient denies any frank pain but was intolerant of having her left shoulder examined or be turned over for respiratory exam.  She was recently evaluated in the ED last week for Lashon Hillier fall.  ED Course:  She was afebrile and hypotensive down to 80s over 60 which improved after 3 L of fluid resuscitation up to 150s over 90.  CBC showed no leukocytosis or anemia.  CMP showed mildly elevated creatinine of 1.48 from Eldean Klatt prior of 1.09.  Lactate of 2.2.  Urinalysis shows large leukocyte, negative nitrite and many WBC.   Assessment & Plan:   Active Problems:   Diabetes mellitus (HCC)   Chronic diastolic (congestive) heart failure (HCC)   Atrial fibrillation, chronic (HCC)   Hypotension   Cerebral thrombosis with cerebral infarction   Right frontoparietal lobe infarct - Discussed CT finding with Neurology Dr. Wilford Corner - believes new stroke likely sometime between last CT head on 10/6 and this admission.  - per neurology, suspect R frontal infarct 2/2 progressive R ICA stenosis in setting of urosepsis - Pt unable to get MRI brain due to foreign metal object in the past  - will obtain CTA head and neck -  moderate diffuse narrowing of mid distal cervical R ICA (new/progressed from CTA from 2019 and concerning for possible interval dissection), 65% atheromatous stenoses about carotid bifurcations bilaterally - severe and extensive atherosclerotic change throughout intracranial circulation (multifocal severe R A1 and V4 stenoses), short segment severe proximal R V1 stenosis.  Severe near occlsuive stenosis at the mid right subclavian artery.  Small layering bilateral pleural effusions. - unable to obtain MRI - repeat head CT with little interval change in size and appearance of evolving subacute R MCA territory infarct. - echocardiogram - EF 65-70% (see report) - continue Eliquis and aspirin  - Obtain A1c (9.6) and lipids (LDL 41, HDL 31) - PT/OT/SLT - Frequent neuro checks and keep on telemetry - Avoid low BP with severe stenosis (goal BP 130-150 given severe stenosis, continue IVF and follow - per neurology, adding or changing to another antiplatelet would increase risk of hemorrhage - recommended treat UTI.  Recommending follow carotid US - if stenosis >60%, will need to c/s vascular surgery. Plan for SNF.  Left sided weakness - multifactorial from new right sided infarct and recent fall -pt intolerant of movement to left upper extremity due to left shoulder pain - she c/o pain all over, difficult to tell significance of this - Left shoulder X-ray negative. L forearm and hand films negative for acute findings - incomplete healing of distal radial fx seen on 06/2019, will touch base with orthopedics in AM - of note, her husband notes chronic pain since this was initially  seen Yoselyn Mcglade few months ago - ortho recommended wrist splint, will need outpatient f/u   Possible Sepsis - Urine with large LE, rare bacteria, negative nitrite, RBC's and >50 WBC - blood cx NGTD and urine cx with insignificant growth (of note, urine cx collected after abx) - CXR with minimal basilar atelectasis - Started on  vanc/cefepime - d/c vanc and cefepime -> narrowed to ceftriaxone today.  Will complete 7 day course with keflex.  Hypotension likely secondary to sepsis  - improved with 3L fluid resuscitation, continue IVF - Continue to closely monitor to avoid any further hypotension especially in the setting of new infarct - hold antihypertensives - BP improved, now Shaarav Ripple bit hypertensive  AKI: 2/2 above, improved  Atrial fibrillation -Currently rate controlled. -Continue Eliquis -she's slightly tachy today in the low 100's - restart metop at low dose  Type 2 diabetes -Hemoglobin A1c of 11.5 few weeks ago -Normally on 30 units of Lantus at bedtime - start on 20 units Lantus qHS and moderate SSI  Chronic diastolic CHF -Stable.  Patient is hypovolemic with improved blood pressure s/p fluid resuscitation.  Hypothyroidism -Continue levothyroxine  Anxiety - continue Effexor  DVT prophylaxis: eliquis Code Status: full  Family Communication: none at bedside - husband over phone - called daughter, no answer x2 Disposition Plan: pending further improvement   Consultants:   neurology  Procedures:  Echo IMPRESSIONS    1. Left ventricular ejection fraction, by visual estimation, is 65-70%. The left ventricle has normal function. Normal left ventricular size. Left ventricular septal wall thickness was severely increased. Severely increased left ventricular posterior  wall thickness.  2. Definity contrast agent was given IV to delineate the left ventricular endocardial borders.  3. Left ventricular diastolic Doppler parameters are indeterminate pattern of LV diastolic filling. The left ventricular diastology could not be evaluatedsecondary to atrial fibrillation. .  4. There is Emmy Keng dynamic mid LV cavitary gradient measuring 28mmHg at rest.  5. Global right ventricle has normal systolic function.The right ventricular size is normal. No increase in right ventricular wall thickness.  6. Left  atrial size was mildly dilated.  7. Right atrial size was normal.  8. Mild to moderate mitral annular calcification.. Trace mitral valve regurgitation. No evidence of mitral stenosis.  9. The tricuspid valve is normal in structure. Tricuspid valve regurgitation is trivial. 10. The aortic valve was not well visualized. Aortic valve regurgitation was not visualized by color flow Doppler. Structurally normal aortic valve, with no evidence of sclerosis or stenosis. 11. The pulmonic valve was normal in structure. Pulmonic valve regurgitation is not visualized by color flow Doppler. 12. Normal pulmonary artery systolic pressure. 13. The inferior vena cava is normal in size with greater than 50% respiratory variability, suggesting right atrial pressure of 3 mmHg.  Antimicrobials:  Anti-infectives (From admission, onward)   Start     Dose/Rate Route Frequency Ordered Stop   09/20/19 0900  cephALEXin (KEFLEX) capsule 500 mg  Status:  Discontinued     500 mg Oral Every 6 hours 09/19/19 1635 09/19/19 1636   09/20/19 0600  cephALEXin (KEFLEX) capsule 500 mg     500 mg Oral Every 8 hours 09/19/19 1636     09/19/19 0900  cefTRIAXone (ROCEPHIN) 2 g in sodium chloride 0.9 % 100 mL IVPB  Status:  Discontinued     2 g 200 mL/hr over 30 Minutes Intravenous Every 24 hours 09/18/19 1620 09/19/19 1635   09/17/19 2200  vancomycin (VANCOCIN) IVPB 1000 mg/200 mL premix  Status:  Discontinued     1,000 mg 200 mL/hr over 60 Minutes Intravenous Every 24 hours 09/16/19 2241 09/18/19 1607   09/17/19 2000  ceFEPIme (MAXIPIME) 2 g in sodium chloride 0.9 % 100 mL IVPB  Status:  Discontinued     2 g 200 mL/hr over 30 Minutes Intravenous Every 24 hours 09/16/19 2241 09/17/19 0843   09/17/19 1000  ceFEPIme (MAXIPIME) 2 g in sodium chloride 0.9 % 100 mL IVPB  Status:  Discontinued     2 g 200 mL/hr over 30 Minutes Intravenous Every 12 hours 09/17/19 0843 09/18/19 1607   09/16/19 2330  vancomycin (VANCOCIN) 1,500 mg in  sodium chloride 0.9 % 500 mL IVPB     1,500 mg 250 mL/hr over 120 Minutes Intravenous  Once 09/16/19 2315 09/17/19 0304   09/16/19 2245  vancomycin (VANCOCIN) 500 mg in sodium chloride 0.9 % 100 mL IVPB  Status:  Discontinued     500 mg 100 mL/hr over 60 Minutes Intravenous  Once 09/16/19 2241 09/16/19 2315   09/16/19 2230  ceFEPIme (MAXIPIME) 2 g in sodium chloride 0.9 % 100 mL IVPB     2 g 200 mL/hr over 30 Minutes Intravenous  Once 09/16/19 2216 09/17/19 0028   09/16/19 2230  metroNIDAZOLE (FLAGYL) IVPB 500 mg     500 mg 100 mL/hr over 60 Minutes Intravenous  Once 09/16/19 2216 09/17/19 0052   09/16/19 2230  vancomycin (VANCOCIN) IVPB 1000 mg/200 mL premix  Status:  Discontinued     1,000 mg 200 mL/hr over 60 Minutes Intravenous  Once 09/16/19 2216 09/16/19 2315     Subjective: Feeling better today  Objective: Vitals:   09/19/19 2345 09/20/19 0335 09/20/19 1005 09/20/19 1204  BP: (!) 144/104 (!) 154/102 (!) 156/126 (!) 149/113  Pulse: (!) 101 78 (!) 116 (!) 103  Resp: 17 18 18 12   Temp: 98.3 F (36.8 C) 98.2 F (36.8 C) 99.2 F (37.3 C) 98.6 F (37 C)  TempSrc: Oral Oral Oral Oral  SpO2: 98% 99% 98% 98%  Weight:      Height:        Intake/Output Summary (Last 24 hours) at 09/20/2019 1503 Last data filed at 09/20/2019 0937 Gross per 24 hour  Intake 100 ml  Output 1650 ml  Net -1550 ml   Filed Weights   09/17/19 0200  Weight: 77 kg    Examination:  General: No acute distress. Cardiovascular: irreg irreg, mild tachy Lungs: Clear to auscultation bilaterally . Abdomen: Soft, nontender, nondistended  Neurological: Alert, but confused. Does not participate in strength testing to LUE.  LLE weakness noted.  Skin: Warm and dry. No rashes or lesions. Extremities: No clubbing or cyanosis. No edema.   Data Reviewed: I have personally reviewed following labs and imaging studies  CBC: Recent Labs  Lab 09/16/19 2110 09/17/19 0245 09/18/19 0307 09/19/19 0324  09/20/19 0534  WBC 9.8 10.5 7.6 7.5 8.5  HGB 13.3 10.8* 10.9* 10.3* 10.1*  HCT 41.6 34.7* 33.0* 31.3* 30.9*  MCV 83.5 85.3 81.7 82.2 83.1  PLT 366 295 294 272 263   Basic Metabolic Panel: Recent Labs  Lab 09/16/19 2110 09/17/19 0245 09/18/19 0307 09/19/19 0324 09/20/19 0534  NA 134* 138 139 138 138  K 4.1 3.5 3.5 3.6 3.6  CL 99 113* 110 108 109  CO2 21* 19* 20* 22 19*  GLUCOSE 192* 108* 140* 168* 141*  BUN 40* 31* 19 16 10   CREATININE 1.48* 1.05* 1.08* 0.89 0.69  CALCIUM 9.9  7.9* 9.4 9.1 8.7*  MG  --   --  1.8 1.9 1.8   GFR: Estimated Creatinine Clearance: 56 mL/min (by C-G formula based on SCr of 0.69 mg/dL). Liver Function Tests: Recent Labs  Lab 09/16/19 2110 09/18/19 0307 09/19/19 0324 09/20/19 0534  AST 41 24 22 25   ALT 42 27 23 21   ALKPHOS 109 87 84 79  BILITOT 0.6 0.6 0.8 0.6  PROT 7.0 6.0* 5.4* 5.6*  ALBUMIN 3.5 2.8* 2.7* 2.7*   No results for input(s): LIPASE, AMYLASE in the last 168 hours. No results for input(s): AMMONIA in the last 168 hours. Coagulation Profile: Recent Labs  Lab 09/16/19 2300  INR 1.3*   Cardiac Enzymes: No results for input(s): CKTOTAL, CKMB, CKMBINDEX, TROPONINI in the last 168 hours. BNP (last 3 results) No results for input(s): PROBNP in the last 8760 hours. HbA1C: No results for input(s): HGBA1C in the last 72 hours. CBG: Recent Labs  Lab 09/18/19 1246 09/18/19 1617 09/18/19 2152 09/19/19 0629 09/19/19 1659  GLUCAP 209* 224* 209* 153* 179*   Lipid Profile: No results for input(s): CHOL, HDL, LDLCALC, TRIG, CHOLHDL, LDLDIRECT in the last 72 hours. Thyroid Function Tests: No results for input(s): TSH, T4TOTAL, FREET4, T3FREE, THYROIDAB in the last 72 hours. Anemia Panel: No results for input(s): VITAMINB12, FOLATE, FERRITIN, TIBC, IRON, RETICCTPCT in the last 72 hours. Sepsis Labs: Recent Labs  Lab 09/16/19 2205 09/16/19 2300  LATICACIDVEN 2.2* 2.0*    Recent Results (from the past 240 hour(s))  Blood  Culture (routine x 2)     Status: None (Preliminary result)   Collection Time: 09/16/19 10:22 PM   Specimen: BLOOD LEFT FOREARM  Result Value Ref Range Status   Specimen Description BLOOD LEFT FOREARM  Final   Special Requests   Final    BOTTLES DRAWN AEROBIC AND ANAEROBIC Blood Culture results may not be optimal due to an excessive volume of blood received in culture bottles   Culture   Final    NO GROWTH 4 DAYS Performed at [facility], [address], [place], [address] [place]    Report Status PENDING  Incomplete  Blood Culture (routine x 2)     Status: None (Preliminary result)   Collection Time: 09/16/19 10:22 PM   Specimen: BLOOD RIGHT HAND  Result Value Ref Range Status   Specimen Description BLOOD RIGHT HAND  Final   Special Requests   Final    BOTTLES DRAWN AEROBIC AND ANAEROBIC Blood Culture results may not be optimal due to an inadequate volume of blood received in culture bottles   Culture   Final    NO GROWTH 4 DAYS Performed at [facility], [address] [place], [address] [place]    Report Status PENDING  Incomplete  SARS CORONAVIRUS 2 (TAT 6-24 HRS) Nasopharyngeal Nasopharyngeal Swab     Status: None   Collection Time: 09/16/19 11:56 PM   Specimen: Nasopharyngeal Swab  Result Value Ref Range Status   SARS Coronavirus 2 NEGATIVE NEGATIVE Final    Comment: (NOTE) SARS-CoV-2 target nucleic acids are NOT DETECTED. The SARS-CoV-2 RNA is generally detectable in upper and lower respiratory specimens during the acute phase of infection. Negative results do not preclude SARS-CoV-2 infection, do not rule out co-infections with other pathogens, and should not be used as the sole basis for treatment or other patient management decisions. Negative results must be combined with clinical observations, patient history, and epidemiological information. The expected result is Negative. Fact Sheet  for Patients: HairSlick.no Fact  Sheet for Healthcare Providers: quierodirigir.com This test is not yet approved or cleared by the [place] FDA and  has been authorized for detection and/or diagnosis of SARS-CoV-2 by FDA under an Emergency Use Authorization (EUA). This EUA will remain  in effect (meaning this test can be used) for the duration of the COVID-19 declaration under Section 56 4(b)(1) of the Act, 21 U.S.C. section 360bbb-3(b)(1), unless the authorization is terminated or revoked sooner. Performed at [facility], [address] [place], [place] [address]   Urine culture     Status: Abnormal   Collection Time: 09/17/19  1:08 AM   Specimen: Urine, Random  Result Value Ref Range Status   Specimen Description URINE, RANDOM  Final   Special Requests NONE  Final   Culture (Lindsay Straka)  Final    <10,000 COLONIES/mL INSIGNIFICANT GROWTH Performed at [facility], [address], [place], [place] [address]    Report Status 09/18/2019 FINAL  Final  MRSA PCR Screening     Status: None   Collection Time: 09/17/19  9:57 PM   Specimen: Nasal Mucosa; Nasopharyngeal  Result Value Ref Range Status   MRSA by PCR NEGATIVE NEGATIVE Final    Comment:        The GeneXpert MRSA Assay (FDA approved for NASAL specimens only), is one component of Delmar Arriaga comprehensive MRSA colonization surveillance program. It is not intended to diagnose MRSA infection nor to guide or monitor treatment for MRSA infections. Performed at [facility], [address], [place], [place] [address]          Radiology Studies: No results found.      Scheduled Meds: . apixaban  5 mg Oral BID  . aspirin EC  81 mg Oral Daily  . atorvastatin  40 mg Oral q1800  . cephALEXin  500 mg Oral Q8H  . insulin aspart  0-15 Units Subcutaneous TID WC  . insulin glargine  20 Units Subcutaneous QHS  . levothyroxine  50 mcg Oral QAC breakfast  . magnesium oxide  800 mg Oral BID  . mouth rinse  15 mL Mouth Rinse  BID  . metoprolol tartrate  12.5 mg Oral BID  . polyvinyl alcohol  1 drop Both Eyes QID  . potassium chloride  10 mEq Oral Daily  . venlafaxine XR  150 mg Oral Q breakfast   Continuous Infusions: . sodium chloride 75 mL/hr at 09/20/19 0338     LOS: 3 days    Time spent: over 30 min    Lacretia Nicks, MD [facility] Pager [email or web address]  If 7PM-7AM, please contact night-coverage [email or web address] Password [email or web address] 09/20/2019, 3:03 PM

## 2019-09-20 NOTE — Progress Notes (Signed)
STROKE TEAM PROGRESS NOTE   INTERVAL HISTORY  Neurologically stable.  No changes.  CT angiogram shows moderate bilateral stenosis which has progressed compared to previous study.  Carotid ultrasound ordered not done yet.  LDL cholesterol 46 mg percent.  Hemoglobin A1c elevated at 9.6  Vitals:   09/19/19 2345 09/20/19 0335 09/20/19 1005 09/20/19 1204  BP: (!) 144/104 (!) 154/102 (!) 156/126 (!) 149/113  Pulse: (!) 101 78 (!) 116 (!) 103  Resp: 17 18 18 12   Temp: 98.3 F (36.8 C) 98.2 F (36.8 C) 99.2 F (37.3 C) 98.6 F (37 C)  TempSrc: Oral Oral Oral Oral  SpO2: 98% 99% 98% 98%  Weight:      Height:        CBC:  Recent Labs  Lab 09/19/19 0324 09/20/19 0534  WBC 7.5 8.5  HGB 10.3* 10.1*  HCT 31.3* 30.9*  MCV 82.2 83.1  PLT 272 161    Basic Metabolic Panel:  Recent Labs  Lab 09/19/19 0324 09/20/19 0534  NA 138 138  K 3.6 3.6  CL 108 109  CO2 22 19*  GLUCOSE 168* 141*  BUN 16 10  CREATININE 0.89 0.69  CALCIUM 9.1 8.7*  MG 1.9 1.8   Lipid Panel:     Component Value Date/Time   CHOL 92 09/17/2019 0245   CHOL 346 (H) 09/23/2017 1803   TRIG 99 09/17/2019 0245   HDL 31 (L) 09/17/2019 0245   HDL 56 09/23/2017 1803   CHOLHDL 3.0 09/17/2019 0245   VLDL 20 09/17/2019 0245   LDLCALC 41 09/17/2019 0245   LDLCALC Comment 09/23/2017 1803   HgbA1c:  Lab Results  Component Value Date   HGBA1C 9.6 (H) 09/17/2019   Urine Drug Screen:     Component Value Date/Time   LABOPIA POSITIVE (A) 07/02/2018 2208   COCAINSCRNUR NONE DETECTED 07/02/2018 2208   LABBENZ POSITIVE (A) 07/02/2018 2208   AMPHETMU NONE DETECTED 07/02/2018 2208   THCU NONE DETECTED 07/02/2018 2208   LABBARB NONE DETECTED 07/02/2018 2208    Alcohol Level     Component Value Date/Time   ETH <10 05/19/2018 0237    IMAGING  Ct Head Wo Contrast 09/18/2019 IMPRESSION:  1. Little interval change in size and appearance of evolving subacute right MCA territory infarct. Subtle serpiginous  hyperdensity within the area of infarction most consistent with faint petechial hemorrhage or possibly evolving laminar necrosis. No evidence for hemorrhagic transformation.  2. Otherwise stable appearance of the brain with additional chronic ischemic changes as above. No other new acute intracranial abnormality.    Transthoracic Echocardiogram  09/17/2019 IMPRESSIONS  1. Left ventricular ejection fraction, by visual estimation, is 65-70%. The left ventricle has normal function. Normal left ventricular size. Left ventricular septal wall thickness was severely increased. Severely increased left ventricular posterior wall thickness.  2. Definity contrast agent was given IV to delineate the left ventricular endocardial borders.  3. Left ventricular diastolic Doppler parameters are indeterminate pattern of LV diastolic filling. The left ventricular diastology could not be evaluated secondary to atrial fibrillation. .  4. There is a dynamic mid LV cavitary gradient measuring 37mmHg at rest.  5. Global right ventricle has normal systolic function.The right ventricular size is normal. No increase in right ventricular wall thickness.  6. Left atrial size was mildly dilated.  7. Right atrial size was normal.  8. Mild to moderate mitral annular calcification.. Trace mitral valve regurgitation. No evidence of mitral stenosis.  9. The tricuspid valve is normal in structure.  Tricuspid valve regurgitation is trivial. 10. The aortic valve was not well visualized. Aortic valve regurgitation was not visualized by color flow Doppler. Structurally normal aortic valve, with no evidence of sclerosis or stenosis. 11. The pulmonic valve was normal in structure. Pulmonic valve regurgitation is not visualized by color flow Doppler. 12. Normal pulmonary artery systolic pressure. 13. The inferior vena cava is normal in size with greater than 50% respiratory variability, suggesting right atrial pressure of 3  mmHg.   PHYSICAL EXAM  Temp:  [98 F (36.7 C)-99.2 F (37.3 C)] 98.6 F (37 C) (10/26 1204) Pulse Rate:  [78-116] 103 (10/26 1204) Resp:  [12-18] 12 (10/26 1204) BP: (118-156)/(85-126) 149/113 (10/26 1204) SpO2:  [98 %-100 %] 98 % (10/26 1204)   . Afebrile. Head is nontraumatic. Neck is supple without bruit.    Cardiac exam no murmur or gallop. Lungs are clear to auscultation. Distal pulses are well felt. Elderly Caucasian lady in mild distress due to pain in the left forearm where she has splint. Awake alert oriented to place and person.  Diminished attention, registration and recall.  Extraocular movements are full range without nystagmus.  Blinks to threat bilaterally.  Mild left lower facial weakness.  Tongue midline.  Left hemiplegia but strength testing limited due to severe pain in the left arm on movements.  Normal strength on the right.  Sensation appears preserved bilaterally.  Left plantar equivocal right downgoing.  Gait not tested.     ASSESSMENT/PLAN [name] is a 78 y.o. female with history of CAD, AF on AC, chronic pain on narcotics, prior stroke w/ residual aphasia on aspirin for intracranial atherosclerosis presenting with L sided weakness along with speech changes more than basleine.   Stroke:  right frontal MCA branch infarct secondary to progressive right ICA stenosis in the setting of urosepsis  CT head worsening hypoattenuation R frontoparietal lobe c/w infarct evolution. Old L MCA territory infarct. Small vessel disease. Atrophy.   CTA head & neck no LVO. Moderate diffuse narrowing mid-distal cervical R ICA new since 2019 and suspicious for interval dissection. B ICA bifurcation 65% stenosis. Severe and extensive intracranial atherosclerosis (multifocal severe R A2 and V4, hypoplastic L VA occluded). Severe proximal R V1 stenosis. Near occlusive stenosis R subclavian.   MRI  (unable to perform d/t foreign metal around eye)  Repeat CT head - Little  interval change in size and appearance of evolving subacute right MCA territory infarct. Subtle serpiginous hyperdensity within the area of infarction most consistent with faint petechial hemorrhage or possibly evolving laminar necrosis. No evidence for hemorrhagic transformation.   2D Echo - EF 65 - 70%. No cardiac source of emboli identified.   LDL 41  HgbA1c 9.6  Eliquis for VTE prophylaxis  aspirin 81 mg daily and Eliquis (apixaban) daily prior to admission, now on aspirin 81 mg daily and Eliquis (apixaban) daily.   Therapy recommendations: SNF  Disposition:  pending   Vascular stenosis  04/2018 CTA head and neck - L M3 occlusion, b/l ICA bifurcation, L ICA siphon, R A2, R V4, L V3, R V1 and mid R subclavian.   This admission, CTA head & neck no LVO. Moderate diffuse narrowing mid-distal cervical R ICA new since 2019 and suspicious for interval dissection. B ICA bifurcation 65% stenosis. Severe and extensive intracranial atherosclerosis (multifocal severe R A2 and V4, hypoplastic L VA occluded). Severe proximal R V1 stenosis. Near occlusive stenosis R subclavian.  On eliquis and ASA 81  BP goal 130-150  given severe stenosis  Avoid low BP  Urosepsis  BP low on admission   Received multiple IV bolus  BP improved and then low again  Put on NS IVF @ 75  Encourage po intake  On cefepime, vanco and flagyl  Blood culture - no growth x 2 days  Urine culture - insignificant growth - UA WBC > 50  Treatment per primary team  Hx stroke/TIA  06/2018 admitted for tachycardia and found to have AF w/ RVR and LA thrombus. Put on eliquis and ASA 81  04/2018 L brain infarct. Found significant intra and extracranial atherosclerosis. L M3 occlusion felt to be culprit lesion. Put on DAPT but did not complete course.  Atrial Fibrillation  06/2018 admitted for tachycardia and found to have AF w/ RVR and LA thrombus. Put on eliquis.  Home anticoagulation:  Eliquis (apixaban) daily,  continued in the hospital  BB on hold to allow for permissive HTN . Continue Eliquis (apixaban) daily at discharge   Hypotension Hx Hypertension  Low BP likely d/t sepsis . Permissive hypertension (OK if < 220/120) but gradually normalize in 5-7 days . Monitor  . Long-term BP goal normotensive  Left arm pain  Recent fall  X-ray showed stable distal radial and ulnar fractures.   Pain management per primary team  Hyperlipidemia  Home meds:  lipitor 40, resumed in hospital  LDL 41, goal < 70  Continue statin at discharge  Diabetes type II Uncontrolled  HgbA1c 9.6, goal < 7.0  CBGs  SSI  Close PCP follow up for better DM control  DM coordinator consult can be considered  Other Stroke Risk Factors  Advanced age  Hx narcotic addiction.   Obesity, Body mass index is 30.07 kg/m., recommend weight loss, diet and exercise as appropriate   Coronary artery disease s/p PCI, stent  Chronic diastolic CHF  Hx LV thrombus  Other Active Problems  Hypothyroidism  Anxiety   Chronic issues w/ somnolence, sleep issues, Hx Malachi Carl (followed by Dr. Vickey Huger as an OP)  Hx Bell's Palsy w/ resultant R facial weakness  Chronic back pain   Hospital day # 3    Patient has new right frontal MCA branch infarct likely secondary to symptomatic moderate right carotid stenosis which is progressed compared to previous study.  Recommend check carotid ultrasound and CT angiogram often overestimates stenosis.  If stenosis is less than 50% continue medical management otherwise may need to consult vascular surgery if stenosis is greater than 60% of stroke.  Continue Eliquis for A. fib for stroke prevention.  Aggressive risk factor modification.  Greater than 50% time during this 25-minute visit was spent on counseling and coordination of care and discussion with care team.  Discussed with Dr. Lowell Guitar. Delia Heady, MD 09/20/2019 3:59 PM      To contact Stroke Continuity  provider, please refer to [email or web address]. After hours, contact General Neurology

## 2019-09-20 NOTE — Progress Notes (Signed)
Pt's in afib

## 2019-09-20 NOTE — Plan of Care (Signed)
°  Problem: Coping: °Goal: Level of anxiety will decrease °Outcome: Progressing °  °

## 2019-09-20 NOTE — Consult Note (Signed)
   [facility] Inpatient Consult   09/20/2019  [name] 1941/07/21 [id number]  Patient was assessed for [place] [facility] for community services. Patient was previously been outreached by a [place] [facility] pharmacist and pharmacist assistance for medication  Assistance.  Patient had also been active with a [place] as well for Diabetes management.  Medicare NGACO. Patient admitted with Right frontoparietal lobe infarct.   However, patient was admitted to SNF and is currently being recommended for return to SNF.   Plan:  Follow for disposition.    Of note, [facility] Care Management services does not replace or interfere with any services that are arranged by inpatient [facility] care management team.   For additional questions or referrals please contact:  Natividad Brood, RN BSN [facility] Liaison  [phone] business mobile phone Toll free office [phone]  Fax number: [phone] [email or web address]
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
## 2019-09-20 NOTE — Care Management Important Message (Signed)
Important Message  Patient Details  Name: [name] MRN: [id number] Date of Birth: 1940-12-29   Medicare Important Message Given:  Yes     Orbie Pyo 09/20/2019, 2:51 PM

## 2019-09-21 ENCOUNTER — Inpatient Hospital Stay (HOSPITAL_COMMUNITY): Payer: Medicare Other

## 2019-09-21 DIAGNOSIS — I6330 Cerebral infarction due to thrombosis of unspecified cerebral artery: Secondary | ICD-10-CM | POA: Diagnosis not present

## 2019-09-21 DIAGNOSIS — R531 Weakness: Secondary | ICD-10-CM | POA: Diagnosis not present

## 2019-09-21 LAB — GLUCOSE, CAPILLARY
Glucose-Capillary: 119 mg/dL — ABNORMAL HIGH (ref 70–99)
Glucose-Capillary: 236 mg/dL — ABNORMAL HIGH (ref 70–99)
Glucose-Capillary: 124 mg/dL — ABNORMAL HIGH (ref 70–99)
Glucose-Capillary: 153 mg/dL — ABNORMAL HIGH (ref 70–99)

## 2019-09-21 LAB — CULTURE, BLOOD (ROUTINE X 2)
Culture: NO GROWTH
Culture: NO GROWTH

## 2019-09-21 LAB — COMPREHENSIVE METABOLIC PANEL
ALT: 21 U/L (ref 0–44)
AST: 22 U/L (ref 15–41)
Albumin: 2.5 g/dL — ABNORMAL LOW (ref 3.5–5.0)
Alkaline Phosphatase: 75 U/L (ref 38–126)
Anion gap: 8 (ref 5–15)
BUN: 11 mg/dL (ref 8–23)
CO2: 21 mmol/L — ABNORMAL LOW (ref 22–32)
Calcium: 8.8 mg/dL — ABNORMAL LOW (ref 8.9–10.3)
Chloride: 108 mmol/L (ref 98–111)
Creatinine, Ser: 0.78 mg/dL (ref 0.44–1.00)
GFR calc Af Amer: >60 mL/min (ref >60)
GFR calc non Af Amer: >60 mL/min (ref >60)
Glucose, Bld: 136 mg/dL — ABNORMAL HIGH (ref 70–99)
Potassium: 3.5 mmol/L (ref 3.5–5.1)
Sodium: 137 mmol/L (ref 135–145)
Total Bilirubin: 0.3 mg/dL (ref 0.3–1.2)
Total Protein: 5.5 g/dL — ABNORMAL LOW (ref 6.5–8.1)

## 2019-09-21 LAB — CBC
HCT: 30.1 % — ABNORMAL LOW (ref 36.0–46.0)
Hemoglobin: 9.6 g/dL — ABNORMAL LOW (ref 12.0–15.0)
MCH: 26.9 pg (ref 26.0–34.0)
MCHC: 31.9 g/dL (ref 30.0–36.0)
MCV: 84.3 fL (ref 80.0–100.0)
Platelets: 279 10*3/uL (ref 150–400)
RBC: 3.57 MIL/uL — ABNORMAL LOW (ref 3.87–5.11)
RDW: 14.6 % (ref 11.5–15.5)
WBC: 7.4 10*3/uL (ref 4.0–10.5)
nRBC: 0 % (ref 0.0–0.2)

## 2019-09-21 LAB — MAGNESIUM: Magnesium: 1.9 mg/dL (ref 1.7–2.4)

## 2019-09-21 MED ORDER — METOPROLOL TARTRATE 25 MG PO TABS
25.0000 mg | ORAL_TABLET | Freq: Two times a day (BID) | ORAL | Status: DC
  Administered 2019-09-21 – 2019-09-28 (×14): 25 mg via ORAL
  Filled 2019-09-21 (×14): qty 1
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
## 2019-09-21 NOTE — Progress Notes (Signed)
PROGRESS NOTE    [name]  ZOX:[id number] DOB: December 17, 1940 DOA: 09/16/2019 PCP: Georgina Quint, MD   Brief Narrative: [name] is [name] 78 y.o. 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  Patient unable to provide history and does not know why she is in the hospital.  No family at bedside. Per EDP report, daughter noticed that about [name] week ago she seemed to have left-sided neglect and some slurred speech.  Facility reportedly does not feel that she has any deviations from her baseline but requested ED eval due to family concerns.  Patient denies any frank pain but was intolerant of having her left shoulder examined or be turned over for respiratory exam.  She was recently evaluated in the ED last week for [name] fall.  ED Course:  She was afebrile and hypotensive down to 80s over 60 which improved after 3 L of fluid resuscitation up to 150s over 90.  CBC showed no leukocytosis or anemia.  CMP showed mildly elevated creatinine of 1.48 from [name] prior of 1.09.  Lactate of 2.2.  Urinalysis shows large leukocyte, negative nitrite and many WBC.  She was admitted due to [name] stroke thought 2/2 hypotension due to [name] possible urinary tract infection.  She's improved with antibiotics and IVF and supportive care  She was seen by neurology who recommended continuing aspirin and eliquis, BP goal 130-150 given her vascular stenosis.  At this time, awaiting SNF placement.   Assessment & Plan:   Active Problems:   Diabetes mellitus (HCC)   Chronic diastolic (congestive) heart failure (HCC)   Atrial fibrillation, chronic (HCC)   Hypotension   Cerebral thrombosis with cerebral infarction   Right frontoparietal lobe infarct - Discussed CT finding  with Neurology Dr. Wilford Williams - believes new stroke likely sometime between last CT head on 10/6 and this admission.  - per neurology, suspect R frontal infarct 2/2 progressive R ICA stenosis in setting of urosepsis - Pt unable to get MRI brain due to foreign metal object in the past  - will obtain CTA head and neck - moderate diffuse narrowing of mid distal cervical R ICA (new/progressed from CTA from 2019 and concerning for possible interval dissection), 65% atheromatous stenoses about carotid bifurcations bilaterally - severe and extensive atherosclerotic change throughout intracranial circulation (multifocal severe R A1 and V4 stenoses), short segment severe proximal R V1 stenosis.  Severe near occlsuive stenosis at the mid right subclavian artery.  Small layering bilateral pleural effusions. - unable to obtain MRI - repeat head CT with little interval change in size and appearance of evolving subacute R MCA territory infarct. - Neurology recommended follow carotid US as CTA often overestimates stenosis and if stenosis >60%, c/s vascular -> carotid US with 1-39% stenosis bilaterally, antegrade R vertebral artery.  L vertebral not visualized.  Will continue aspirin/eliquis.   - echocardiogram - EF 65-70% (see report) - continue Eliquis and aspirin  - Obtain A1c (9.6) and lipids (LDL 41, HDL 31) - PT/OT/SLT -> recommending SNF - speech recommending dysphagia 3, thin liquids - Frequent neuro checks and keep on telemetry - Avoid low BP with severe stenosis (goal BP 130-150 given severe stenosis) - per neurology, adding or changing to another antiplatelet would increase risk of hemorrhage - recommended treat UTI.  Left sided weakness - multifactorial from new right sided infarct and recent fall -pt intolerant of movement to left upper extremity due to left shoulder pain - she c/o pain all over, difficult to tell significance of this - Left shoulder X-ray negative. L forearm and hand films negative for  acute findings - incomplete healing of distal radial fx seen on 06/2019, will touch base with orthopedics in AM - of note, her husband notes chronic pain since this was initially seen [name] few months ago - ortho recommended wrist splint, will need outpatient f/u   Possible Sepsis secondary to Urinary Tract Infection - Urine with large LE, rare bacteria, negative nitrite, RBC's and >50 WBC - blood cx NGTD and urine cx with insignificant growth (of note, urine cx collected after abx) - CXR with minimal basilar atelectasis - Started on vanc/cefepime - d/c vanc and cefepime -> narrowed to ceftriaxone.  Will complete 7 day course with keflex.  Hypotension likely secondary to sepsis  - improved with 3L fluid resuscitation, continue IVF - Continue to closely monitor to avoid any further hypotension especially in the setting of new infarct - hold antihypertensives - BP improved, now [name] bit hypertensive  AKI: 2/2 above, improved  Atrial fibrillation -Currently rate controlled. -Continue Eliquis -she's slightly tachy today in the low 100's - will continue to gradually increase metop  Type 2 diabetes -Hemoglobin A1c of 11.5 few weeks ago -Normally on 30 units of Lantus at bedtime - start on 20 units Lantus qHS and moderate SSI  Chronic diastolic CHF -Stable.  Patient is hypovolemic with improved blood pressure s/p fluid resuscitation.  Hypothyroidism -Continue levothyroxine  Anxiety - continue Effexor  DVT prophylaxis: eliquis Code Status: full  Family Communication: none at bedside - daughter on 10/26 Disposition Plan: pending further improvement - pending SNF placement   Consultants:   neurology  Procedures:  Echo IMPRESSIONS    1. Left ventricular ejection fraction, by visual estimation, is 65-70%. The left ventricle has normal function. Normal left ventricular size. Left ventricular septal wall thickness was severely increased. Severely increased left ventricular  posterior  wall thickness.  2. Definity contrast agent was given IV to delineate the left ventricular endocardial borders.  3. Left ventricular diastolic Doppler parameters are indeterminate pattern of LV diastolic filling. The left ventricular diastology could not be evaluatedsecondary to atrial fibrillation. .  4. There is Isel Skufca dynamic mid LV cavitary gradient measuring at rest.  5. Global right ventricle has normal systolic function.The right ventricular size is normal. No increase in right ventricular wall thickness.  6. Left atrial size was mildly dilated.  7. Right atrial size was normal.  8. Mild to moderate mitral annular calcification.. Trace mitral valve regurgitation. No evidence of mitral stenosis.  9. The tricuspid valve is normal in structure. Tricuspid valve regurgitation is trivial. 10. The aortic valve was not well visualized. Aortic valve regurgitation was not visualized by color flow Doppler. Structurally normal aortic valve, with no evidence of sclerosis or stenosis. 11. The pulmonic valve was normal in structure. Pulmonic valve regurgitation is not visualized by color flow Doppler. 12. Normal pulmonary artery systolic pressure. 13. The inferior vena cava is normal in size with greater than 50% respiratory variability, suggesting right atrial pressure of 3 mmHg.  Antimicrobials:  Anti-infectives (From admission, onward)   Start     Dose/Rate Route Frequency Ordered Stop   09/20/19 0900  cephALEXin (KEFLEX) capsule 500 mg  Status:  Discontinued     500 mg Oral  Every 6 hours 09/19/19 1635 09/19/19 1636   09/20/19 0600  cephALEXin (KEFLEX) capsule 500 mg     500 mg Oral Every 8 hours 09/19/19 1636     09/19/19 0900  cefTRIAXone (ROCEPHIN) 2 g in sodium chloride 0.9 % 100 mL IVPB  Status:  Discontinued     2 g 200 mL/hr over 30 Minutes Intravenous Every 24 hours 09/18/19 1620 09/19/19 1635   09/17/19 2200  vancomycin (VANCOCIN) IVPB 1000 mg/200 mL premix  Status:   Discontinued     1,000 mg 200 mL/hr over 60 Minutes Intravenous Every 24 hours 09/16/19 2241 09/18/19 1607   09/17/19 2000  ceFEPIme (MAXIPIME) 2 g in sodium chloride 0.9 % 100 mL IVPB  Status:  Discontinued     2 g 200 mL/hr over 30 Minutes Intravenous Every 24 hours 09/16/19 2241 09/17/19 0843   09/17/19 1000  ceFEPIme (MAXIPIME) 2 g in sodium chloride 0.9 % 100 mL IVPB  Status:  Discontinued     2 g 200 mL/hr over 30 Minutes Intravenous Every 12 hours 09/17/19 0843 09/18/19 1607   09/16/19 2330  vancomycin (VANCOCIN) 1,500 mg in sodium chloride 0.9 % 500 mL IVPB     1,500 mg 250 mL/hr over 120 Minutes Intravenous  Once 09/16/19 2315 09/17/19 0304   09/16/19 2245  vancomycin (VANCOCIN) 500 mg in sodium chloride 0.9 % 100 mL IVPB  Status:  Discontinued     500 mg 100 mL/hr over 60 Minutes Intravenous  Once 09/16/19 2241 09/16/19 2315   09/16/19 2230  ceFEPIme (MAXIPIME) 2 g in sodium chloride 0.9 % 100 mL IVPB     2 g 200 mL/hr over 30 Minutes Intravenous  Once 09/16/19 2216 09/17/19 0028   09/16/19 2230  metroNIDAZOLE (FLAGYL) IVPB 500 mg     500 mg 100 mL/hr over 60 Minutes Intravenous  Once 09/16/19 2216 09/17/19 0052   09/16/19 2230  vancomycin (VANCOCIN) IVPB 1000 mg/200 mL premix  Status:  Discontinued     1,000 mg 200 mL/hr over 60 Minutes Intravenous  Once 09/16/19 2216 09/16/19 2315     Subjective: Feels well, no complaints at this time  Objective: Vitals:   09/20/19 1939 09/20/19 2335 09/21/19 0357 09/21/19 0744  BP: 118/88 (!) 144/102 (!) 149/97 (!) 133/95  Pulse: (!) 117 100 (!) 104   Resp: 12 12 12 16   Temp: 98.4 F (36.9 C) 98.1 F (36.7 C) 97.7 F (36.5 C) 97.8 F (36.6 C)  TempSrc:  Oral Oral Oral  SpO2: 97% 99% 96% 98%  Weight:      Height:        Intake/Output Summary (Last 24 hours) at 09/21/2019 1531 Last data filed at 09/21/2019 1200 Gross per 24 hour  Intake 1697.96 ml  Output 850 ml  Net 847.96 ml   Filed Weights   09/17/19 0200  Weight:  77 kg    Examination:  General: No acute distress. Cardiovascular: irreg, irreg, mild tachy Lungs: Clear to auscultation bilaterally Abdomen: Soft, nontender, nondistended  Neurological: Alert. Does not cooperate with strength testing of LUE (likely due to pain), LLE weakness.   Skin: Warm and dry. No rashes or lesions. Extremities: No clubbing or cyanosis. No edema.   Data Reviewed: I have personally reviewed following labs and imaging studies  CBC: Recent Labs  Lab 09/17/19 0245 09/18/19 0307 09/19/19 0324 09/20/19 0534 09/21/19 0405  WBC 10.5 7.6 7.5 8.5 7.4  HGB 10.8* 10.9* 10.3* 10.1* 9.6*  HCT 34.7* 33.0* 31.3* 30.9*  30.1*  MCV 85.3 81.7 82.2 83.1 84.3  PLT 295 294 272 263 279   Basic Metabolic Panel: Recent Labs  Lab 09/17/19 0245 09/18/19 0307 09/19/19 0324 09/20/19 0534 09/21/19 0405  NA 138 139 138 138 137  K 3.5 3.5 3.6 3.6 3.5  CL 113* 110 108 109 108  CO2 19* 20* 22 19* 21*  GLUCOSE 108* 140* 168* 141* 136*  BUN 31* 19 16 10 11   CREATININE 1.05* 1.08* 0.89 0.69 0.78  CALCIUM 7.9* 9.4 9.1 8.7* 8.8*  MG  --  1.8 1.9 1.8 1.9   GFR: Estimated Creatinine Clearance: 56 mL/min (by C-G formula based on SCr of 0.78 mg/dL). Liver Function Tests: Recent Labs  Lab 09/16/19 2110 09/18/19 0307 09/19/19 0324 09/20/19 0534 09/21/19 0405  AST 41 24 22 25 22   ALT 42 27 23 21 21   ALKPHOS 109 87 84 79 75  BILITOT 0.6 0.6 0.8 0.6 0.3  PROT 7.0 6.0* 5.4* 5.6* 5.5*  ALBUMIN 3.5 2.8* 2.7* 2.7* 2.5*   No results for input(s): LIPASE, AMYLASE in the last 168 hours. No results for input(s): AMMONIA in the last 168 hours. Coagulation Profile: Recent Labs  Lab 09/16/19 2300  INR 1.3*   Cardiac Enzymes: No results for input(s): CKTOTAL, CKMB, CKMBINDEX, TROPONINI in the last 168 hours. BNP (last 3 results) No results for input(s): PROBNP in the last 8760 hours. HbA1C: No results for input(s): HGBA1C in the last 72 hours. CBG: Recent Labs  Lab  09/19/19 1659 09/20/19 1618 09/20/19 2058 09/21/19 0602 09/21/19 1239  GLUCAP 179* 159* 193* 119* 236*   Lipid Profile: No results for input(s): CHOL, HDL, LDLCALC, TRIG, CHOLHDL, LDLDIRECT in the last 72 hours. Thyroid Function Tests: No results for input(s): TSH, T4TOTAL, FREET4, T3FREE, THYROIDAB in the last 72 hours. Anemia Panel: No results for input(s): VITAMINB12, FOLATE, FERRITIN, TIBC, IRON, RETICCTPCT in the last 72 hours. Sepsis Labs: Recent Labs  Lab 09/16/19 2205 09/16/19 2300  LATICACIDVEN 2.2* 2.0*    Recent Results (from the past 240 hour(s))  Blood Culture (routine x 2)     Status: None   Collection Time: 09/16/19 10:22 PM   Specimen: BLOOD LEFT FOREARM  Result Value Ref Range Status   Specimen Description BLOOD LEFT FOREARM  Final   Special Requests   Final    BOTTLES DRAWN AEROBIC AND ANAEROBIC Blood Culture results may not be optimal due to an excessive volume of blood received in culture bottles   Culture   Final    NO GROWTH 5 DAYS Performed at [facility], [address], [place], [facility] [address]    Report Status 09/21/2019 FINAL  Final  Blood Culture (routine x 2)     Status: None   Collection Time: 09/16/19 10:22 PM   Specimen: BLOOD RIGHT HAND  Result Value Ref Range Status   Specimen Description BLOOD RIGHT HAND  Final   Special Requests   Final    BOTTLES DRAWN AEROBIC AND ANAEROBIC Blood Culture results may not be optimal due to an inadequate volume of blood received in culture bottles   Culture   Final    NO GROWTH 5 DAYS Performed at [facility], [address], [place], [facility] [address]    Report Status 09/21/2019 FINAL  Final  SARS CORONAVIRUS 2 (TAT 6-24 HRS) Nasopharyngeal Nasopharyngeal Swab     Status: None   Collection Time: 09/16/19 11:56 PM   Specimen: Nasopharyngeal Swab  Result Value Ref Range  Status   SARS Coronavirus 2 NEGATIVE NEGATIVE Final    Comment: (NOTE) SARS-CoV-2 target nucleic acids are NOT  DETECTED. The SARS-CoV-2 RNA is generally detectable in upper and lower respiratory specimens during the acute phase of infection. Negative results do not preclude SARS-CoV-2 infection, do not rule out co-infections with other pathogens, and should not be used as the sole basis for treatment or other patient management decisions. Negative results must be combined with clinical observations, patient history, and epidemiological information. The expected result is Negative. Fact Sheet for Patients: HairSlick.nohttps://www.fda.gov/media/138098/download Fact Sheet for Healthcare Providers: quierodirigir.comhttps://www.fda.gov/media/138095/download This test is not yet approved or cleared by the [place] FDA and  has been authorized for detection and/or diagnosis of SARS-CoV-2 by FDA under an Emergency Use Authorization (EUA). This EUA will remain  in effect (meaning this test can be used) for the duration of the COVID-19 declaration under Section 56 4(b)(1) of the Act, 21 U.S.C. section 360bbb-3(b)(1), unless the authorization is terminated or revoked sooner. Performed at [facility], [address] [place], [place] [address]   Urine culture     Status: Abnormal   Collection Time: 09/17/19  1:08 AM   Specimen: Urine, Random  Result Value Ref Range Status   Specimen Description URINE, RANDOM  Final   Special Requests NONE  Final   Culture (Gautam Langhorst)  Final    <10,000 COLONIES/mL INSIGNIFICANT GROWTH Performed at [facility], [address] [place], [place] [address]    Report Status 09/18/2019 FINAL  Final  MRSA PCR Screening     Status: None   Collection Time: 09/17/19  9:57 PM   Specimen: Nasal Mucosa; Nasopharyngeal  Result Value Ref Range Status   MRSA by PCR NEGATIVE NEGATIVE Final    Comment:        The GeneXpert MRSA Assay (FDA approved for NASAL specimens only), is one component of Saivon Prowse comprehensive MRSA colonization surveillance program. It is not intended to diagnose MRSA infection  nor to guide or monitor treatment for MRSA infections. Performed at [facility], [address] [place], [place] [address]   SARS CORONAVIRUS 2 (TAT 6-24 HRS) Nasopharyngeal Nasopharyngeal Swab     Status: None   Collection Time: 09/20/19  5:15 PM   Specimen: Nasopharyngeal Swab  Result Value Ref Range Status   SARS Coronavirus 2 NEGATIVE NEGATIVE Final    Comment: (NOTE) SARS-CoV-2 target nucleic acids are NOT DETECTED. The SARS-CoV-2 RNA is generally detectable in upper and lower respiratory specimens during the acute phase of infection. Negative results do not preclude SARS-CoV-2 infection, do not rule out co-infections with other pathogens, and should not be used as the sole basis for treatment or other patient management decisions. Negative results must be combined with clinical observations, patient history, and epidemiological information. The expected result is Negative. Fact Sheet for Patients: HairSlick.nohttps://www.fda.gov/media/138098/download Fact Sheet for Healthcare Providers: quierodirigir.comhttps://www.fda.gov/media/138095/download This test is not yet approved or cleared by the [place] FDA and  has been authorized for detection and/or diagnosis of SARS-CoV-2 by FDA under an Emergency Use Authorization (EUA). This EUA will remain  in effect (meaning this test can be used) for the duration of the COVID-19 declaration under Section 56 4(b)(1) of the Act, 21 U.S.C. section 360bbb-3(b)(1), unless the authorization is terminated or revoked sooner. Performed at [facility], [address] [place], [place] [address]          Radiology Studies: Vas Koreas Carotid  Result Date: 09/21/2019 Carotid  Arterial Duplex Study Indications:       CVA. Risk Factors:      Hypertension, hyperlipidemia, coronary artery disease. Limitations        Today's exam was limited due to the patient's inability or                    unwillingness to cooperate. Comparison Study:  no prior  Performing Technologist: Blanch Media RVS  Examination Guidelines: Zakir Henner complete evaluation includes B-mode imaging, spectral Doppler, color Doppler, and power Doppler as needed of all accessible portions of each vessel. Bilateral testing is considered an integral part of Ausencio Vaden complete examination. Limited examinations for reoccurring indications may be performed as noted.  Right Carotid Findings: +----------+--------+--------+--------+-------------------------+--------------+             PSV cm/s EDV cm/s Stenosis Plaque Description        Comments        +----------+--------+--------+--------+-------------------------+--------------+  CCA Prox   100                        heterogenous                              +----------+--------+--------+--------+-------------------------+--------------+  CCA Distal 59                         heterogenous                              +----------+--------+--------+--------+-------------------------+--------------+  ICA Prox   39       10       1-39%    calcific and heterogenous                 +----------+--------+--------+--------+-------------------------+--------------+  ICA Distal                                                      Not visualized  +----------+--------+--------+--------+-------------------------+--------------+  ECA        132                        calcific                                  +----------+--------+--------+--------+-------------------------+--------------+ +----------+--------+-------+--------+-------------------+             PSV cm/s EDV cms Describe Arm Pressure (mmHG)  +----------+--------+-------+--------+-------------------+  Subclavian 37                                             +----------+--------+-------+--------+-------------------+ +---------+--------+--+--------+--+---------+  Vertebral PSV cm/s 45 EDV cm/s 16 Antegrade  +---------+--------+--+--------+--+---------+  Left Carotid Findings:  +----------+--------+--------+--------+-------------------------+--------+             PSV cm/s EDV cm/s Stenosis Plaque Description        Comments  +----------+--------+--------+--------+-------------------------+--------+  CCA Prox   102      22                heterogenous                        +----------+--------+--------+--------+-------------------------+--------+  CCA Distal 107      15                heterogenous                        +----------+--------+--------+--------+-------------------------+--------+  ICA Prox   117      35       1-39%    heterogenous and calcific           +----------+--------+--------+--------+-------------------------+--------+  ICA Distal 103      29                                                    +----------+--------+--------+--------+-------------------------+--------+  ECA        187                                                            +----------+--------+--------+--------+-------------------------+--------+ +----------+--------+--------+--------+-------------------+             PSV cm/s EDV cm/s Describe Arm Pressure (mmHG)  +----------+--------+--------+--------+-------------------+  Subclavian 173                                             +----------+--------+--------+--------+-------------------+ +---------+--------+--------+--------------+  Vertebral PSV cm/s EDV cm/s Not identified  +---------+--------+--------+--------------+  Summary: Right Carotid: Velocities in the right ICA are consistent with Johnta Couts 1-39% stenosis. Left Carotid: Velocities in the left ICA are consistent with Creedon Danielski 1-39% stenosis. Vertebrals: Right vertebral artery demonstrates antegrade flow. Left vertebral             artery was not visualized. *See table(s) above for measurements and observations.  Electronically signed by Delia Heady MD on 09/21/2019 at 3:31:18 PM.    Final         Scheduled Meds:  apixaban  5 mg Oral BID   aspirin EC  81 mg Oral Daily   atorvastatin  40 mg  Oral q1800   cephALEXin  500 mg Oral Q8H   insulin aspart  0-15 Units Subcutaneous TID WC   insulin glargine  20 Units Subcutaneous QHS   levothyroxine  50 mcg Oral QAC breakfast   magnesium oxide  800 mg Oral BID   mouth rinse  15 mL Mouth Rinse BID   metoprolol tartrate  12.5 mg Oral BID   polyethylene glycol  17 g Oral BID   polyvinyl alcohol  1 drop Both Eyes QID   potassium chloride  10 mEq Oral Daily   venlafaxine XR  150 mg Oral Q breakfast   Continuous Infusions:  sodium chloride 75 mL/hr at 09/20/19 1831     LOS: 4 days    Time spent: over 30 min    Lacretia Nicks, MD [facility] Pager AMION  If 7PM-7AM, please contact night-coverage [email or web address] Password [email or web address] 09/21/2019, 3:31 PM

## 2019-09-21 NOTE — Progress Notes (Signed)
Physical Therapy Treatment Patient Details Name: [name] MRN: [id number] DOB: [birth year] Today's Date: 09/21/2019    History of Present Illness [name] is a 78 y.o. 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 Of note, pt with R shoulder pain from recent fall. MRI revealed R frontoparietal CVA L arm pain from stable distal radial and ulnar fxs.     PT Comments    Pt tolerated bed to chair transfer with use of bariatric stedy but continues to need +2 max A for bed mobility and sit<>stand to lower flaps of stedy. Pt continue to c/o BUE pain L>R and lethargic after pain meds. PT will continue to follow.    Follow Up Recommendations  SNF;Supervision/Assistance - 24 hour     Equipment Recommendations  None recommended by PT    Recommendations for Other Services       Precautions / Restrictions Precautions Precautions: Fall Restrictions Weight Bearing Restrictions: No Other Position/Activity Restrictions: Pt with stable LUE ulnar and radial fx    Mobility  Bed Mobility Overal bed mobility: Needs Assistance Bed Mobility: Supine to Sit;Rolling Rolling: Max assist;+2 for physical assistance   Supine to sit: Max assist;+2 for physical assistance     General bed mobility comments: maxA+2 for trunk elevation and BLE movement off of bed. Pt requiring maxA to scoot EOB.  Transfers Overall transfer level: Needs assistance Equipment used: Ambulation equipment used Transfers: Sit to/from [employer] Sit to Stand: Max assist;+2 physical assistance Stand pivot transfers: Total assist;+2 physical assistance       General transfer comment: used stedy to pivot to recliner. Pt barely able  to stand enought to put flaps of stedy down to sit  Ambulation/Gait             General Gait Details: unable   Stairs             Wheelchair Mobility    Modified Rankin (Stroke Patients Only) Modified Rankin (Stroke Patients Only) Pre-Morbid Rankin Score: Moderately severe disability Modified Rankin: Severe disability     Balance Overall balance assessment: Needs assistance Sitting-balance support: No upper extremity supported;Feet supported Sitting balance-Leahy Scale: Poor Sitting balance - Comments: min-guard in sitting with vc's to correct L lean Postural control: Left lateral lean Standing balance support: Bilateral upper extremity supported Standing balance-Leahy Scale: Zero Standing balance comment: max A +2 to maintain standing position                            Cognition Arousal/Alertness: Lethargic Behavior During Therapy: WFL for tasks assessed/performed Overall Cognitive Status: No family/caregiver present to determine baseline cognitive functioning                                 General Comments: pt lethargic and somewhat disoriented today with some confused speech      Exercises General Exercises - Lower Extremity Ankle Circles/Pumps: AROM;Both;20 reps;Supine    General Comments General comments (skin integrity, edema, etc.): RN instructed to use maximove for return to bed      Pertinent Vitals/Pain Pain Assessment: Faces Faces Pain Scale: Hurts even more Pain Location: L arm elbow  through hand and R shoulder Pain Descriptors / Indicators: Grimacing;Discomfort Pain Intervention(s): Limited activity within patient's tolerance;Monitored during session;RN gave pain meds during session;Patient requesting pain meds-RN notified    Home Living                      Prior Function            PT Goals (current goals can now be found in the care plan section) Acute Rehab PT Goals Patient Stated Goal: get  pain meds for UE pain (RN gave before session) PT Goal Formulation: Patient unable to participate in goal setting Time For Goal Achievement: 10/02/19 Potential to Achieve Goals: Fair Progress towards PT goals: Progressing toward goals    Frequency    Min 3X/week      PT Plan Current plan remains appropriate    Co-evaluation              AM-PAC PT "6 Clicks" Mobility   Outcome Measure  Help needed turning from your back to your side while in a flat bed without using bedrails?: Total Help needed moving from lying on your back to sitting on the side of a flat bed without using bedrails?: Total Help needed moving to and from a bed to a chair (including a wheelchair)?: Total Help needed standing up from a chair using your arms (e.g., wheelchair or bedside chair)?: Total Help needed to walk in hospital room?: Total Help needed climbing 3-5 steps with a railing? : Total 6 Click Score: 6    End of Session Equipment Utilized During Treatment: Gait belt Activity Tolerance: Patient tolerated treatment well Patient left: with call bell/phone within reach;in chair;with chair alarm set Nurse Communication: Mobility status PT Visit Diagnosis: History of falling (Z91.81);Muscle weakness (generalized) (M62.81);Repeated falls (R29.6);Unsteadiness on feet (R26.81);Pain Pain - Right/Left: Left Pain - part of body: Arm     Time: 1007-1040 PT Time Calculation (min) (ACUTE ONLY): 33 min  Charges:  $Therapeutic Activity: 23-37 mins                     Leighton Roach, [place]  Pager [phone] Office [place] 09/21/2019, 2:00 PM

## 2019-09-21 NOTE — Progress Notes (Signed)
Carotid duplex has been completed.   Preliminary results in CV Proc.   Abram Sander 09/21/2019 10:19 AM

## 2019-09-21 NOTE — Progress Notes (Signed)
STROKE TEAM PROGRESS NOTE   INTERVAL HISTORY  Neurologically stable.  No changes.  CT angiogram shows moderate bilateral stenosis which has progressed compared to previous study.  Carotid ultrasound shows only 1-39% b/l stenosis hence no need for revascularization at this time..    Vitals:   09/20/19 2335 09/21/19 0357 09/21/19 0744 09/21/19 1600  BP: (!) 144/102 (!) 149/97 (!) 133/95 (!) 146/99  Pulse: 100 (!) 104    Resp: 12 12 16 12   Temp: 98.1 F (36.7 C) 97.7 F (36.5 C) 97.8 F (36.6 C) 98.3 F (36.8 C)  TempSrc: Oral Oral Oral Oral  SpO2: 99% 96% 98% 98%  Weight:      Height:        CBC:  Recent Labs  Lab 09/20/19 0534 09/21/19 0405  WBC 8.5 7.4  HGB 10.1* 9.6*  HCT 30.9* 30.1*  MCV 83.1 84.3  PLT 263 353    Basic Metabolic Panel:  Recent Labs  Lab 09/20/19 0534 09/21/19 0405  NA 138 137  K 3.6 3.5  CL 109 108  CO2 19* 21*  GLUCOSE 141* 136*  BUN 10 11  CREATININE 0.69 0.78  CALCIUM 8.7* 8.8*  MG 1.8 1.9   Lipid Panel:     Component Value Date/Time   CHOL 92 09/17/2019 0245   CHOL 346 (H) 09/23/2017 1803   TRIG 99 09/17/2019 0245   HDL 31 (L) 09/17/2019 0245   HDL 56 09/23/2017 1803   CHOLHDL 3.0 09/17/2019 0245   VLDL 20 09/17/2019 0245   LDLCALC 41 09/17/2019 0245   LDLCALC Comment 09/23/2017 1803   HgbA1c:  Lab Results  Component Value Date   HGBA1C 9.6 (H) 09/17/2019   Urine Drug Screen:     Component Value Date/Time   LABOPIA POSITIVE (A) 07/02/2018 2208   COCAINSCRNUR NONE DETECTED 07/02/2018 2208   LABBENZ POSITIVE (A) 07/02/2018 2208   AMPHETMU NONE DETECTED 07/02/2018 2208   THCU NONE DETECTED 07/02/2018 2208   LABBARB NONE DETECTED 07/02/2018 2208    Alcohol Level     Component Value Date/Time   ETH <10 05/19/2018 0237    IMAGING  Ct Head Wo Contrast 09/18/2019 IMPRESSION:  1. Little interval change in size and appearance of evolving subacute right MCA territory infarct. Subtle serpiginous hyperdensity within the  area of infarction most consistent with faint petechial hemorrhage or possibly evolving laminar necrosis. No evidence for hemorrhagic transformation.  2. Otherwise stable appearance of the brain with additional chronic ischemic changes as above. No other new acute intracranial abnormality.    Transthoracic Echocardiogram  09/17/2019 IMPRESSIONS  1. Left ventricular ejection fraction, by visual estimation, is 65-70%. The left ventricle has normal function. Normal left ventricular size. Left ventricular septal wall thickness was severely increased. Severely increased left ventricular posterior wall thickness.  2. Definity contrast agent was given IV to delineate the left ventricular endocardial borders.  3. Left ventricular diastolic Doppler parameters are indeterminate pattern of LV diastolic filling. The left ventricular diastology could not be evaluated secondary to atrial fibrillation. .  4. There is a dynamic mid LV cavitary gradient measuring 64mmHg at rest.  5. Global right ventricle has normal systolic function.The right ventricular size is normal. No increase in right ventricular wall thickness.  6. Left atrial size was mildly dilated.  7. Right atrial size was normal.  8. Mild to moderate mitral annular calcification.. Trace mitral valve regurgitation. No evidence of mitral stenosis.  9. The tricuspid valve is normal in structure. Tricuspid valve regurgitation  is trivial. 10. The aortic valve was not well visualized. Aortic valve regurgitation was not visualized by color flow Doppler. Structurally normal aortic valve, with no evidence of sclerosis or stenosis. 11. The pulmonic valve was normal in structure. Pulmonic valve regurgitation is not visualized by color flow Doppler. 12. Normal pulmonary artery systolic pressure. 13. The inferior vena cava is normal in size with greater than 50% respiratory variability, suggesting right atrial pressure of 3 mmHg.   PHYSICAL EXAM  Temp:  [97.7  F (36.5 C)-98.4 F (36.9 C)] 98.3 F (36.8 C) (10/27 1600) Pulse Rate:  [100-117] 104 (10/27 0357) Resp:  [12-16] 12 (10/27 1600) BP: (118-149)/(88-102) 146/99 (10/27 1600) SpO2:  [96 %-99 %] 98 % (10/27 1600)   . Afebrile. Head is nontraumatic. Neck is supple without bruit.    Cardiac exam no murmur or gallop. Lungs are clear to auscultation. Distal pulses are well felt. Elderly Caucasian lady in mild distress due to pain in the left forearm where she has splint. Awake alert oriented to place and person.  Diminished attention, registration and recall.  Extraocular movements are full range without nystagmus.  Blinks to threat bilaterally.  Mild left lower facial weakness.  Tongue midline.  Left hemiplegia but strength testing limited due to severe pain in the left arm on movements.  Normal strength on the right.  Sensation appears preserved bilaterally.  Left plantar equivocal right downgoing.  Gait not tested.     ASSESSMENT/PLAN [name] is a 78 y.o. female with history of CAD, AF on AC, chronic pain on narcotics, prior stroke w/ residual aphasia on aspirin for intracranial atherosclerosis presenting with L sided weakness along with speech changes more than basleine.   Stroke:  right frontal MCA branch infarct secondary to progressive right ICA stenosis in the setting of urosepsis  CT head worsening hypoattenuation R frontoparietal lobe c/w infarct evolution. Old L MCA territory infarct. Small vessel disease. Atrophy.   CTA head & neck no LVO. Moderate diffuse narrowing mid-distal cervical R ICA new since 2019 and suspicious for interval dissection. B ICA bifurcation 65% stenosis. Severe and extensive intracranial atherosclerosis (multifocal severe R A2 and V4, hypoplastic L VA occluded). Severe proximal R V1 stenosis. Near occlusive stenosis R subclavian.   MRI  (unable to perform d/t foreign metal around eye)  Repeat CT head - Little interval change in size and appearance  of evolving subacute right MCA territory infarct. Subtle serpiginous hyperdensity within the area of infarction most consistent with faint petechial hemorrhage or possibly evolving laminar necrosis. No evidence for hemorrhagic transformation.   2D Echo - EF 65 - 70%. No cardiac source of emboli identified.   LDL 41  HgbA1c 9.6  Carotid dopplers 1-39 % b/l stenosis  Eliquis for VTE prophylaxis  aspirin 81 mg daily and Eliquis (apixaban) daily prior to admission, now on aspirin 81 mg daily and Eliquis (apixaban) daily.   Therapy recommendations: SNF  Disposition:  pending   Vascular stenosis  04/2018 CTA head and neck - L M3 occlusion, b/l ICA bifurcation, L ICA siphon, R A2, R V4, L V3, R V1 and mid R subclavian.   This admission, CTA head & neck no LVO. Moderate diffuse narrowing mid-distal cervical R ICA new since 2019 and suspicious for interval dissection. B ICA bifurcation 65% stenosis. Severe and extensive intracranial atherosclerosis (multifocal severe R A2 and V4, hypoplastic L VA occluded). Severe proximal R V1 stenosis. Near occlusive stenosis R subclavian.  On eliquis and ASA 81  BP goal 130-150 given severe stenosis  Avoid low BP  Urosepsis  BP low on admission   Received multiple IV bolus  BP improved and then low again  Put on NS IVF @ 75  Encourage po intake  On cefepime, vanco and flagyl  Blood culture - no growth x 2 days  Urine culture - insignificant growth - UA WBC > 50  Treatment per primary team  Hx stroke/TIA  06/2018 admitted for tachycardia and found to have AF w/ RVR and LA thrombus. Put on eliquis and ASA 81  04/2018 L brain infarct. Found significant intra and extracranial atherosclerosis. L M3 occlusion felt to be culprit lesion. Put on DAPT but did not complete course.  Atrial Fibrillation  06/2018 admitted for tachycardia and found to have AF w/ RVR and LA thrombus. Put on eliquis.  Home anticoagulation:  Eliquis (apixaban) daily,  continued in the hospital  BB on hold to allow for permissive HTN . Continue Eliquis (apixaban) daily at discharge   Hypotension Hx Hypertension  Low BP likely d/t sepsis . Permissive hypertension (OK if < 220/120) but gradually normalize in 5-7 days . Monitor  . Long-term BP goal normotensive  Left arm pain  Recent fall  X-ray showed stable distal radial and ulnar fractures.   Pain management per primary team  Hyperlipidemia  Home meds:  lipitor 40, resumed in hospital  LDL 41, goal < 70  Continue statin at discharge  Diabetes type II Uncontrolled  HgbA1c 9.6, goal < 7.0  CBGs  SSI  Close PCP follow up for better DM control  DM coordinator consult can be considered  Other Stroke Risk Factors  Advanced age  Hx narcotic addiction.   Obesity, Body mass index is 30.07 kg/m., recommend weight loss, diet and exercise as appropriate   Coronary artery disease s/p PCI, stent  Chronic diastolic CHF  Hx LV thrombus  Other Active Problems  Hypothyroidism  Anxiety   Chronic issues w/ somnolence, sleep issues, Hx Malachi Carl (followed by Dr. Vickey Huger as an OP)  Hx Bell's Palsy w/ resultant R facial weakness  Chronic back pain   Hospital day # 4    Patient has new right frontal MCA branch infarct likely secondary to symptomatic moderate right carotid stenosis which is progressed compared to previous study.   carotid ultrasound  Shows only 1-395 b/l stenosis and and CT angiogram often overestimates stenosis.  No need for revasucalization Continue Eliquis for A. fib for stroke prevention.  Aggressive risk factor modification. Stroke team will sign off. Call for questions  Discussed with Dr. Lowell Guitar. Delia Heady, MD 09/21/2019 5:07 PM      To contact Stroke Continuity provider, please refer to [email or web address]. After hours, contact General Neurology

## 2019-09-21 NOTE — TOC Progression Note (Signed)
Transition of Care [facility]) - Progression Note    Patient Details  Name: [name] MRN: [id number] Date of Birth: [birth year]  Transition of Care [facility]) CM/SW [place], [place] Work Phone Number: 09/21/2019, 2:30 PM  Clinical Narrative:     MSW Intern and Supervisor were advised this morning, via nurse, that pt and family do not want pt to return to [facility]. MSW supervisor faxed client to other facilities. MSW Intern called husband to advise him of SNF options. Husband seemed confused and kept repeating " not [facility], She cannot go back to [facility]". MSW intern advised pt's husband that she could be accepted to [facility] and [facility].   Husband took down the names and phone numbers and requested MSW Intern call daughter for input on the decision. MSW supervisor has called daughter twice, left a voicemail, voicemail box is now full. Pt is ready to discharge pending family make bed decision. MSW Supervisor will continue to follow.   Expected Discharge Plan: Skilled Nursing Facility Barriers to Discharge: Family Issues, Other (comment)(Unable to communicate with daughter)  Expected Discharge Plan and Services Expected Discharge Plan: [place] In-house Referral: Clinical Social Work   Post Acute Care Choice: [place] Living arrangements for the past 2 months: [place], Single Family Home                                       Social Determinants of Health (SDOH) Interventions    Readmission Risk Interventions No flowsheet data found.

## 2019-09-22 ENCOUNTER — Telehealth: Payer: Self-pay | Admitting: Emergency Medicine

## 2019-09-22 DIAGNOSIS — E861 Hypovolemia: Secondary | ICD-10-CM | POA: Diagnosis not present

## 2019-09-22 DIAGNOSIS — I9589 Other hypotension: Secondary | ICD-10-CM | POA: Diagnosis not present

## 2019-09-22 DIAGNOSIS — I482 Chronic atrial fibrillation, unspecified: Secondary | ICD-10-CM | POA: Diagnosis not present

## 2019-09-22 LAB — CBC
HCT: 30.1 % — ABNORMAL LOW (ref 36.0–46.0)
Hemoglobin: 9.9 g/dL — ABNORMAL LOW (ref 12.0–15.0)
MCH: 27.4 pg (ref 26.0–34.0)
MCHC: 32.9 g/dL (ref 30.0–36.0)
MCV: 83.4 fL (ref 80.0–100.0)
Platelets: 253 10*3/uL (ref 150–400)
RBC: 3.61 MIL/uL — ABNORMAL LOW (ref 3.87–5.11)
RDW: 14.9 % (ref 11.5–15.5)
WBC: 7.4 10*3/uL (ref 4.0–10.5)
nRBC: 0 % (ref 0.0–0.2)

## 2019-09-22 LAB — COMPREHENSIVE METABOLIC PANEL
ALT: 19 U/L (ref 0–44)
AST: 23 U/L (ref 15–41)
Albumin: 2.7 g/dL — ABNORMAL LOW (ref 3.5–5.0)
Alkaline Phosphatase: 87 U/L (ref 38–126)
Anion gap: 7 (ref 5–15)
BUN: 10 mg/dL (ref 8–23)
CO2: 23 mmol/L (ref 22–32)
Calcium: 8.9 mg/dL (ref 8.9–10.3)
Chloride: 107 mmol/L (ref 98–111)
Creatinine, Ser: 0.84 mg/dL (ref 0.44–1.00)
GFR calc Af Amer: >60 mL/min (ref >60)
GFR calc non Af Amer: >60 mL/min (ref >60)
Glucose, Bld: 128 mg/dL — ABNORMAL HIGH (ref 70–99)
Potassium: 3.8 mmol/L (ref 3.5–5.1)
Sodium: 137 mmol/L (ref 135–145)
Total Bilirubin: 0.5 mg/dL (ref 0.3–1.2)
Total Protein: 5.4 g/dL — ABNORMAL LOW (ref 6.5–8.1)

## 2019-09-22 LAB — MAGNESIUM: Magnesium: 2 mg/dL (ref 1.7–2.4)

## 2019-09-22 LAB — GLUCOSE, CAPILLARY
Glucose-Capillary: 140 mg/dL — ABNORMAL HIGH (ref 70–99)
Glucose-Capillary: 180 mg/dL — ABNORMAL HIGH (ref 70–99)
Glucose-Capillary: 128 mg/dL — ABNORMAL HIGH (ref 70–99)
Glucose-Capillary: 178 mg/dL — ABNORMAL HIGH (ref 70–99)
Glucose-Capillary: 122 mg/dL — ABNORMAL HIGH (ref 70–99)
Glucose-Capillary: 84 mg/dL (ref 70–99)
Glucose-Capillary: 173 mg/dL — ABNORMAL HIGH (ref 70–99)
Glucose-Capillary: 186 mg/dL — ABNORMAL HIGH (ref 70–99)

## 2019-09-22 NOTE — Plan of Care (Signed)

## 2019-09-22 NOTE — Telephone Encounter (Signed)
Pt's daughter called in requesting to speak with Guadelupe Sabin to give her an update on her mother's health

## 2019-09-22 NOTE — Progress Notes (Signed)
Physical Therapy Treatment Patient Details Name: [name] MRN: [id number] DOB: 1941/06/03 Today's Date: 09/22/2019    History of Present Illness [name] is a 78 y.o. 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 Of note, pt with R shoulder pain from recent fall. MRI revealed R frontoparietal CVA L arm pain from stable distal radial and ulnar fxs.     PT Comments    Patient progressing with OOB to chair mobility this session.  Stedy not available so pt able to stand with mod A of 2, but could not pivot to chair without significant help.  She is fearful and at times limited by cognition.  Remains appropriate for SNF level rehab.  May need sling for L UE for OOB to chair transfers (but not while in bed due to neck pain.)  Also may benefit from glove for swelling in L fingers/hand and won't allow ROM.    Follow Up Recommendations  SNF;Supervision/Assistance - 24 hour     Equipment Recommendations  None recommended by PT    Recommendations for Other Services       Precautions / Restrictions Precautions Precautions: Fall Precaution Comments: Per chart, multiple falls at home.  Required Braces or Orthoses: Splint/Cast Splint/Cast: R wrist splint Restrictions Weight Bearing Restrictions: No Other Position/Activity Restrictions: Pt with stable LUE ulnar and radial fx    Mobility  Bed Mobility Overal bed mobility: Needs Assistance Bed Mobility: Supine to Sit     Supine to sit: HOB elevated;+2 for physical assistance;Max assist     General bed mobility comments: elevated HOB and assist for legs off bed and to scoot hips  Transfers Overall transfer level: Needs assistance   Transfers: Sit to/from  Stand;Stand Pivot Transfers Sit to Stand: +2 physical assistance;Mod assist Stand pivot transfers: +2 physical assistance;Max assist       General transfer comment: stood from EOB with mod A though yelling about pain in back and in her breast due to gait belt, would not pivot hips without forceful pushing hips to drop arm recliner. Adjusted for comfort and removed gait belt in chair  Ambulation/Gait             General Gait Details: unable   Stairs             Wheelchair Mobility    Modified Rankin (Stroke Patients Only) Modified Rankin (Stroke Patients Only) Pre-Morbid Rankin Score: Moderately severe disability Modified Rankin: Severe disability     Balance Overall balance assessment: Needs assistance   Sitting balance-Leahy Scale: Fair Sitting balance - Comments: sitting EOB feet supported without assist x about 10 minutes waiting on pain meds and lift pad for chair     Standing balance-Leahy Scale: Poor Standing balance comment: mod A for standing in flexed posture                            Cognition Arousal/Alertness: Awake/alert Behavior During Therapy: Anxious Overall Cognitive Status: No family/caregiver present to determine baseline cognitive functioning                                 General Comments: oriented to situation,  but had mitt on R hand upon my entry      Exercises General Exercises - Lower Extremity Ankle Circles/Pumps: AROM;Both;20 reps;Supine Heel Slides: AAROM;5 reps;Supine;Both Other Exercises Other Exercises: attempted finger PROM and movement of elbow on L UE but pt screamed and would not allow.  noted edema in fingers and encouraged pt to look at her fingers and think about moving them    General Comments        Pertinent Vitals/Pain Faces Pain Scale: Hurts worst Pain Location: L arm/wrist, back, worse when moibilizing Pain Descriptors / Indicators: Moaning;Grimacing;Guarding Pain  Intervention(s): Monitored during session;Repositioned;Patient requesting pain meds-RN notified;Limited activity within patient's tolerance    Home Living                      Prior Function            PT Goals (current goals can now be found in the care plan section) Progress towards PT goals: Progressing toward goals    Frequency    Min 3X/week      PT Plan Current plan remains appropriate    Co-evaluation              AM-PAC PT "6 Clicks" Mobility   Outcome Measure  Help needed turning from your back to your side while in a flat bed without using bedrails?: Total Help needed moving from lying on your back to sitting on the side of a flat bed without using bedrails?: Total Help needed moving to and from a bed to a chair (including a wheelchair)?: Total Help needed standing up from a chair using your arms (e.g., wheelchair or bedside chair)?: Total Help needed to walk in hospital room?: Total Help needed climbing 3-5 steps with a railing? : Total 6 Click Score: 6    End of Session Equipment Utilized During Treatment: Gait belt Activity Tolerance: Patient limited by pain Patient left: in chair;with call bell/phone within reach;with chair alarm set Nurse Communication: Mobility status;Need for lift equipment PT Visit Diagnosis: History of falling (Z91.81);Muscle weakness (generalized) (M62.81);Repeated falls (R29.6);Unsteadiness on feet (R26.81);Pain Pain - Right/Left: Left Pain - part of body: Arm     Time: 1333-1405 PT Time Calculation (min) (ACUTE ONLY): 32 min  Charges:  $Therapeutic Exercise: 8-22 mins $Therapeutic Activity: 8-22 mins                     Sheran Lawless, [place] [facility] [phone] 09/22/2019    Elray Mcgregor 09/22/2019, 5:25 PM

## 2019-09-22 NOTE — Progress Notes (Signed)
PROGRESS NOTE    [name]  ZOX:[id number] DOB: [birth year] DOA: 09/16/2019 PCP: [name], Kathleen Jose, MD    Brief Narrative:  78 year old female with history of coronary artery disease status post stent, A. fib on Eliquis, history of CVA with residual aphasia and right-sided weakness, chronic diastolic heart failure, hypertension, type 2 diabetes, hypothyroidism who presented from a skilled nursing facility at the request of family member.  Patient was not able to provide any history.  Patient's daughter noticed that about a week ago she seemed to have left-sided neglect and some slurred speech.  Facility reported no obvious changes from baseline.  In the emergency room she was afebrile, hypotensive with blood pressure 80/60 that improved with fluid resuscitation.  CBC showed no leukocytosis or anemia.  Slightly elevated creatinine.  Urinalysis showed large leukocyte, negative nitrite and many WBC.  She was admitted to the hospital secondary to stroke thought to be due to hypotension from possible UTI.  Improved with antibiotics and IV fluid and supportive care.  Seen by neurology who recommended continuing aspirin Eliquis.   Assessment & Plan:   Active Problems:   Diabetes mellitus (HCC)   Chronic diastolic (congestive) heart failure (HCC)   Atrial fibrillation, chronic (HCC)   Hypotension   Cerebral thrombosis with cerebral infarction  Right frontoparietal lobe infarct: Suspect right frontal stroke secondary to progressive right ICA stenosis in the setting of urosepsis and hypotension.  Unable to get MRI due to foreign metal object in the past. CTA of the head and neck showed moderate diffuse narrowing of mid distal cervical right ICA, severe and extensive atherosclerotic disease. Repeat CT scan with interval change in size and appearance of evolving subacute right MCA territory infarct. Carotid ultrasound with less than 39% stenosis bilaterally. Echocardiogram normal EF.  No PFO.  A1c 9.6, LDL 41. Neurology recommendations to continue aspirin and Eliquis.  Avoid dropping blood pressures. Wrist splint on left wrist for pain.  Sepsis secondary to urinary tract infection present on admission: Blood cultures no growth.  Urine cultures with insignificant growth that was collected after antibiotics.  Chest x-ray with no evidence of infection.  Initially treated with broad-spectrum antibiotics and narrowed to ceftriaxone.  Keflex for 2 more days.  Acute kidney injury: Treated with IV fluids and improvement.  Positional A. fib: Currently rate controlled in sinus rhythm.  On Eliquis.  Type 2 diabetes: A1c 9.6.  Blood sugar is stable on current regimen.  She will continue similar dose of insulin and discussed dietary compliance.   DVT prophylaxis: Eliquis Code Status: Full code Family Communication: None Disposition Plan: Skilled nursing facility, pending availability   Consultants:   Neurology  Procedures:   None  Antimicrobials:   Keflex, D5/7   Subjective: Patient seen and examined.  No overnight events.  Continues to have left wrist pain.  Poor historian.  Objective: Vitals:   09/22/19 0344 09/22/19 0811 09/22/19 1100 09/22/19 1707  BP: (!) 131/93 (!) 164/73 (!) 150/88 (!) 145/92  Pulse: 78 79 76 84  Resp: 19 16 16 16   Temp: 98.3 F (36.8 C) (!) 97.4 F (36.3 C) 97.7 F (36.5 C) 98.1 F (36.7 C)  TempSrc:  Oral Oral Oral  SpO2: 97%   97%  Weight:      Height:        Intake/Output Summary (Last 24 hours) at 09/22/2019 1824 Last data filed at 09/21/2019 1925 Gross per 24 hour  Intake 60 ml  Output 300 ml  Net -240 ml  Filed Weights   09/17/19 0200  Weight: 77 kg    Examination:  General exam: Appears calm and comfortable, chronically sick looking. Respiratory system: Clear to auscultation. Respiratory effort normal. Cardiovascular system: S1 & S2 heard, RRR. No JVD, murmurs, rubs, gallops or clicks. No pedal edema.  Gastrointestinal system: Abdomen is nondistended, soft and nontender. No organomegaly or masses felt. Normal bowel sounds heard. Central nervous system: Alert and oriented.  Left lower facial weakness.  Left hemiplegia.  Normal on the right side.    Data Reviewed: I have personally reviewed following labs and imaging studies  CBC: Recent Labs  Lab 09/18/19 0307 09/19/19 0324 09/20/19 0534 09/21/19 0405 09/22/19 0338  WBC 7.6 7.5 8.5 7.4 7.4  HGB 10.9* 10.3* 10.1* 9.6* 9.9*  HCT 33.0* 31.3* 30.9* 30.1* 30.1*  MCV 81.7 82.2 83.1 84.3 83.4  PLT 294 272 263 279 253   Basic Metabolic Panel: Recent Labs  Lab 09/18/19 0307 09/19/19 0324 09/20/19 0534 09/21/19 0405 09/22/19 0338  NA 139 138 138 137 137  K 3.5 3.6 3.6 3.5 3.8  CL 110 108 109 108 107  CO2 20* 22 19* 21* 23  GLUCOSE 140* 168* 141* 136* 128*  BUN CREATININE 1.08* 0.89 0.69 0.78 0.84  CALCIUM 9.4 9.1 8.7* 8.8* 8.9  MG 1.8 1.9 1.8 1.9 2.0   GFR: Estimated Creatinine Clearance: 53.3 mL/min (by C-G formula based on SCr of 0.84 mg/dL). Liver Function Tests: Recent Labs  Lab 09/18/19 0307 09/19/19 0324 09/20/19 0534 09/21/19 0405 09/22/19 0338  AST ALT ALKPHOS 87 84 79 75 87  BILITOT 0.6 0.8 0.6 0.3 0.5  PROT 6.0* 5.4* 5.6* 5.5* 5.4*  ALBUMIN 2.8* 2.7* 2.7* 2.5* 2.7*   No results for input(s): LIPASE, AMYLASE in the last 168 hours. No results for input(s): AMMONIA in the last 168 hours. Coagulation Profile: Recent Labs  Lab 09/16/19 2300  INR 1.3*   Cardiac Enzymes: No results for input(s): CKTOTAL, CKMB, CKMBINDEX, TROPONINI in the last 168 hours. BNP (last 3 results) No results for input(s): PROBNP in the last 8760 hours. HbA1C: No results for input(s): HGBA1C in the last 72 hours. CBG: Recent Labs  Lab 09/21/19 1655 09/21/19 2108 09/22/19 0605 09/22/19 1234 09/22/19 1735  GLUCAP 124* 153* 122* 84 173*   Lipid Profile: No results for  input(s): CHOL, HDL, LDLCALC, TRIG, CHOLHDL, LDLDIRECT in the last 72 hours. Thyroid Function Tests: No results for input(s): TSH, T4TOTAL, FREET4, T3FREE, THYROIDAB in the last 72 hours. Anemia Panel: No results for input(s): VITAMINB12, FOLATE, FERRITIN, TIBC, IRON, RETICCTPCT in the last 72 hours. Sepsis Labs: Recent Labs  Lab 09/16/19 2205 09/16/19 2300  LATICACIDVEN 2.2* 2.0*    Recent Results (from the past 240 hour(s))  Blood Culture (routine x 2)     Status: None   Collection Time: 09/16/19 10:22 PM   Specimen: BLOOD LEFT FOREARM  Result Value Ref Range Status   Specimen Description BLOOD LEFT FOREARM  Final   Special Requests   Final    BOTTLES DRAWN AEROBIC AND ANAEROBIC Blood Culture results may not be optimal due to an excessive volume of blood received in culture bottles   Culture   Final    NO GROWTH 5 DAYS Performed at [facility], [address] [place], [place] [address]    Report Status 09/21/2019 FINAL  Final  Blood Culture (routine x 2)  Status: None   Collection Time: 09/16/19 10:22 PM   Specimen: BLOOD RIGHT HAND  Result Value Ref Range Status   Specimen Description BLOOD RIGHT HAND  Final   Special Requests   Final    BOTTLES DRAWN AEROBIC AND ANAEROBIC Blood Culture results may not be optimal due to an inadequate volume of blood received in culture bottles   Culture   Final    NO GROWTH 5 DAYS Performed at [facility], [address], [place], [place] [address]    Report Status 09/21/2019 FINAL  Final  SARS CORONAVIRUS 2 (TAT 6-24 HRS) Nasopharyngeal Nasopharyngeal Swab     Status: None   Collection Time: 09/16/19 11:56 PM   Specimen: Nasopharyngeal Swab  Result Value Ref Range Status   SARS Coronavirus 2 NEGATIVE NEGATIVE Final    Comment: (NOTE) SARS-CoV-2 target nucleic acids are NOT DETECTED. The SARS-CoV-2 RNA is generally detectable in upper and lower respiratory specimens during the acute phase of infection. Negative  results do not preclude SARS-CoV-2 infection, do not rule out co-infections with other pathogens, and should not be used as the sole basis for treatment or other patient management decisions. Negative results must be combined with clinical observations, patient history, and epidemiological information. The expected result is Negative. Fact Sheet for Patients: HairSlick.no Fact Sheet for Healthcare Providers: quierodirigir.com This test is not yet approved or cleared by the [place] FDA and  has been authorized for detection and/or diagnosis of SARS-CoV-2 by FDA under an Emergency Use Authorization (EUA). This EUA will remain  in effect (meaning this test can be used) for the duration of the COVID-19 declaration under Section 56 4(b)(1) of the Act, 21 U.S.C. section 360bbb-3(b)(1), unless the authorization is terminated or revoked sooner. Performed at [facility], [address], [place], [place] [address]   Urine culture     Status: Abnormal   Collection Time: 09/17/19  1:08 AM   Specimen: Urine, Random  Result Value Ref Range Status   Specimen Description URINE, RANDOM  Final   Special Requests NONE  Final   Culture (A)  Final    <10,000 COLONIES/mL INSIGNIFICANT GROWTH Performed at [facility], [address], [place], [place] [address]    Report Status 09/18/2019 FINAL  Final  MRSA PCR Screening     Status: None   Collection Time: 09/17/19  9:57 PM   Specimen: Nasal Mucosa; Nasopharyngeal  Result Value Ref Range Status   MRSA by PCR NEGATIVE NEGATIVE Final    Comment:        The GeneXpert MRSA Assay (FDA approved for NASAL specimens only), is one component of a comprehensive MRSA colonization surveillance program. It is not intended to diagnose MRSA infection nor to guide or monitor treatment for MRSA infections. Performed at [facility], [address], [place], [place] [address]    SARS CORONAVIRUS 2 (TAT 6-24 HRS) Nasopharyngeal Nasopharyngeal Swab     Status: None   Collection Time: 09/20/19  5:15 PM   Specimen: Nasopharyngeal Swab  Result Value Ref Range Status   SARS Coronavirus 2 NEGATIVE NEGATIVE Final    Comment: (NOTE) SARS-CoV-2 target nucleic acids are NOT DETECTED. The SARS-CoV-2 RNA is generally detectable in upper and lower respiratory specimens during the acute phase of infection. Negative results do not preclude SARS-CoV-2 infection, do not rule out co-infections with other pathogens, and should not be used as the sole basis for treatment or other patient management decisions. Negative  results must be combined with clinical observations, patient history, and epidemiological information. The expected result is Negative. Fact Sheet for Patients: HairSlick.no Fact Sheet for Healthcare Providers: quierodirigir.com This test is not yet approved or cleared by the [place] FDA and  has been authorized for detection and/or diagnosis of SARS-CoV-2 by FDA under an Emergency Use Authorization (EUA). This EUA will remain  in effect (meaning this test can be used) for the duration of the COVID-19 declaration under Section 56 4(b)(1) of the Act, 21 U.S.C. section 360bbb-3(b)(1), unless the authorization is terminated or revoked sooner. Performed at [facility], [address], [place], [place] [address]          Radiology Studies: Vas US Carotid  Result Date: 09/21/2019 Carotid Arterial Duplex Study Indications:       CVA. Risk Factors:      Hypertension, hyperlipidemia, coronary artery disease. Limitations        Today's exam was limited due to the patient's inability or                    unwillingness to cooperate. Comparison Study:  no prior Performing Technologist: Blanch Media RVS  Examination Guidelines: A complete evaluation includes B-mode imaging, spectral Doppler, color  Doppler, and power Doppler as needed of all accessible portions of each vessel. Bilateral testing is considered an integral part of a complete examination. Limited examinations for reoccurring indications may be performed as noted.  Right Carotid Findings: +----------+--------+--------+--------+-------------------------+--------------+           PSV cm/sEDV cm/sStenosisPlaque Description       Comments       +----------+--------+--------+--------+-------------------------+--------------+ CCA Prox  100                     heterogenous                            +----------+--------+--------+--------+-------------------------+--------------+ CCA Distal59                      heterogenous                            +----------+--------+--------+--------+-------------------------+--------------+ ICA Prox  39      10      1-39%   calcific and heterogenous               +----------+--------+--------+--------+-------------------------+--------------+ ICA Distal                                                 Not visualized +----------+--------+--------+--------+-------------------------+--------------+ ECA       132                     calcific                                +----------+--------+--------+--------+-------------------------+--------------+ +----------+--------+-------+--------+-------------------+           PSV cm/sEDV cmsDescribeArm Pressure (mmHG) +----------+--------+-------+--------+-------------------+ [id number]                                         +----------+--------+-------+--------+-------------------+ +---------+--------+--+--------+--+---------+ VertebralPSV  cm/s45EDV cm/s16Antegrade +---------+--------+--+--------+--+---------+  Left Carotid Findings: +----------+--------+--------+--------+-------------------------+--------+           PSV cm/sEDV cm/sStenosisPlaque Description       Comments  +----------+--------+--------+--------+-------------------------+--------+ CCA Prox  102     22              heterogenous                      +----------+--------+--------+--------+-------------------------+--------+ CCA Distal107     15              heterogenous                      +----------+--------+--------+--------+-------------------------+--------+ ICA Prox  117     35      1-39%   heterogenous and calcific         +----------+--------+--------+--------+-------------------------+--------+ ICA Distal103     29                                                +----------+--------+--------+--------+-------------------------+--------+ ECA       187                                                       +----------+--------+--------+--------+-------------------------+--------+ +----------+--------+--------+--------+-------------------+           PSV cm/sEDV cm/sDescribeArm Pressure (mmHG) +----------+--------+--------+--------+-------------------+ [id number]                                         +----------+--------+--------+--------+-------------------+ +---------+--------+--------+--------------+ VertebralPSV cm/sEDV cm/sNot identified +---------+--------+--------+--------------+  Summary: Right Carotid: Velocities in the right ICA are consistent with a 1-39% stenosis. Left Carotid: Velocities in the left ICA are consistent with a 1-39% stenosis. Vertebrals: Right vertebral artery demonstrates antegrade flow. Left vertebral             artery was not visualized. *See table(s) above for measurements and observations.  Electronically signed by Antony Contras MD on 09/21/2019 at 3:31:18 PM.    Final         Scheduled Meds: . apixaban  5 mg Oral BID  . aspirin EC  81 mg Oral Daily  . atorvastatin  40 mg Oral q1800  . cephALEXin  500 mg Oral Q8H  . insulin aspart  0-15 Units Subcutaneous TID WC  . insulin glargine  20 Units Subcutaneous QHS  .  levothyroxine  50 mcg Oral QAC breakfast  . magnesium oxide  800 mg Oral BID  . mouth rinse  15 mL Mouth Rinse BID  . metoprolol tartrate  25 mg Oral BID  . polyethylene glycol  17 g Oral BID  . polyvinyl alcohol  1 drop Both Eyes QID  . potassium chloride  10 mEq Oral Daily  . venlafaxine XR  150 mg Oral Q breakfast   Continuous Infusions: . sodium chloride 75 mL/hr at 09/20/19 1831     LOS: 5 days    Time spent: 25 minutes    Barb Merino, MD [facility] Pager [phone]

## 2019-09-22 NOTE — Telephone Encounter (Signed)
Kim gave me an update.  Pt has been in the hospital for 5 days and pt has suffered for a stroke.   Good news. Pt is now doing better and will be put in skilled nurse facility. She has is doing better and is hopeful.   Pt want to give provider an update.   Pt family is hopeful pt will be getting better

## 2019-09-22 NOTE — TOC Progression Note (Signed)
Transition of Care [facility]) - Progression Note    Patient Details  Name: [name] MRN: [id number] Date of Birth: [birth year]  [facility]) CM/SW Contact  Pollie Friar, RN Phone Number: 09/22/2019, 4:23 PM  Clinical Narrative:    CM has spoken with daughter and faxed the patient out to the SNF in [place] she is interested in: [place]. Neena Rhymes is having an issue getting the faxes even though sent 4 times and have a confirmation.  If patient to d/c to [place] daughter is aware the cost would be on the family for transportation. CM provided her some ground transportation companies to see what the out of pocket cost will be. Daughter also interested in one of the facilities here in [place] but [place], [facility] dont have beds. Awaiting to hear about [facility].   TOC following for further d/c needs.    Expected Discharge Plan: Skilled Nursing Facility Barriers to Discharge: Family Issues, Other (comment)(Unable to communicate with daughter)  Expected Discharge Plan and Services Expected Discharge Plan: [place] In-house Referral: Clinical Social Work   Post Acute Care Choice: [place] Living arrangements for the past 2 months: [place], Single Family Home                                       Social Determinants of Health (SDOH) Interventions    Readmission Risk Interventions No flowsheet data found.

## 2019-09-23 DIAGNOSIS — E861 Hypovolemia: Secondary | ICD-10-CM | POA: Diagnosis not present

## 2019-09-23 DIAGNOSIS — I482 Chronic atrial fibrillation, unspecified: Secondary | ICD-10-CM | POA: Diagnosis not present

## 2019-09-23 DIAGNOSIS — I9589 Other hypotension: Secondary | ICD-10-CM | POA: Diagnosis not present

## 2019-09-23 LAB — GLUCOSE, CAPILLARY
Glucose-Capillary: 154 mg/dL — ABNORMAL HIGH (ref 70–99)
Glucose-Capillary: 135 mg/dL — ABNORMAL HIGH (ref 70–99)
Glucose-Capillary: 304 mg/dL — ABNORMAL HIGH (ref 70–99)
Glucose-Capillary: 145 mg/dL — ABNORMAL HIGH (ref 70–99)
Glucose-Capillary: 189 mg/dL — ABNORMAL HIGH (ref 70–99)

## 2019-09-23 LAB — SARS CORONAVIRUS 2 (TAT 6-24 HRS): SARS Coronavirus 2: NEGATIVE

## 2019-09-23 NOTE — Progress Notes (Signed)
PROGRESS NOTE    [name]  ZJI:[id number] DOB: 12-Sep-1941 DOA: 09/16/2019 PCP: Horald Pollen, MD    Brief Narrative:  78 year old female with history of coronary artery disease status post stent, A. fib on Eliquis, history of CVA with residual aphasia and left sided weakness, chronic diastolic heart failure, hypertension, type 2 diabetes, hypothyroidism who presented from a skilled nursing facility at the request of family member.  Patient was not able to provide any history.  Patient's daughter noticed that about a week ago she seemed to have left-sided neglect and some slurred speech.  Facility reported no obvious changes from baseline.  In the emergency room she was afebrile, hypotensive with blood pressure 80/60 that improved with fluid resuscitation.  CBC showed no leukocytosis or anemia.  Slightly elevated creatinine.  Urinalysis showed large leukocyte, negative nitrite and many WBC.  She was admitted to the hospital secondary to stroke thought to be due to hypotension from possible UTI.  Improved with antibiotics and IV fluid and supportive care.  Seen by neurology who recommended continuing aspirin and Eliquis.   Assessment & Plan:   Active Problems:   Diabetes mellitus (HCC)   Chronic diastolic (congestive) heart failure (HCC)   Atrial fibrillation, chronic (HCC)   Hypotension   Cerebral thrombosis with cerebral infarction  Right frontoparietal lobe infarct: Suspect right frontal stroke secondary to progressive right ICA stenosis in the setting of urosepsis and hypotension.  Unable to get MRI due to foreign metal object in the past. CTA of the head and neck showed moderate diffuse narrowing of mid distal cervical right ICA, severe and extensive atherosclerotic disease. Repeat CT scan with interval change in size and appearance of evolving subacute right MCA territory infarct. Carotid ultrasound with less than 39% stenosis bilaterally. Echocardiogram normal EF.  No  PFO. A1c 9.6, LDL 41. Neurology recommendations to continue aspirin and Eliquis.  Avoid dropping blood pressures. Wrist splint on left wrist for pain.  Sepsis secondary to urinary tract infection present on admission: Blood cultures no growth.  Urine cultures with insignificant growth that was collected after antibiotics.  Chest x-ray with no evidence of infection.  Initially treated with broad-spectrum antibiotics and narrowed to ceftriaxone.  Keflex for 1 more days.  Acute kidney injury: Treated with IV fluids and improvement.  Positional A. fib: Currently rate controlled in sinus rhythm.  On Eliquis.  Type 2 diabetes: A1c 9.6.  Blood sugar is stable on current regimen.  She will continue similar dose of insulin and discussed dietary compliance.   DVT prophylaxis: Eliquis Code Status: Full code Family Communication: None Disposition Plan: Skilled nursing facility, pending availability. Family willing to transfer her to a SNF at [place].   Consultants:   Neurology  Procedures:   None  Antimicrobials:   Keflex, D6/7   Subjective: Patient seen and examined.  No overnight events.  Continues to have left wrist pain and neck pain.   Objective: Vitals:   09/22/19 2343 09/23/19 0402 09/23/19 0849 09/23/19 1151  BP: (!) 129/97 114/80 (!) 105/93 (!) 116/93  Pulse: (!) 110 94 73 80  Resp: 18 16 17 14   Temp: 98.7 F (37.1 C) 98.2 F (36.8 C) 98 F (36.7 C) 99.4 F (37.4 C)  TempSrc: Oral Oral Oral Oral  SpO2: 99% 97% 98% 96%  Weight:      Height:        Intake/Output Summary (Last 24 hours) at 09/23/2019 1534 Last data filed at 09/23/2019 0521 Gross per 24 hour  Intake -  Output 450 ml  Net -450 ml   Filed Weights   09/17/19 0200  Weight: 77 kg    Examination:  General exam: Appears calm and comfortable, chronically sick looking.  Anxious. Respiratory system: Clear to auscultation. Respiratory effort normal. Cardiovascular system: S1 & S2 heard, RRR. No  JVD, murmurs, rubs, gallops or clicks. No pedal edema. Gastrointestinal system: Abdomen is nondistended, soft and nontender. No organomegaly or masses felt. Normal bowel sounds heard. Central nervous system: Alert and oriented.  Left lower facial weakness.  Left hemiplegia.  Normal on the right side.    Data Reviewed: I have personally reviewed following labs and imaging studies  CBC: Recent Labs  Lab 09/18/19 0307 09/19/19 0324 09/20/19 0534 09/21/19 0405 09/22/19 0338  WBC 7.6 7.5 8.5 7.4 7.4  HGB 10.9* 10.3* 10.1* 9.6* 9.9*  HCT 33.0* 31.3* 30.9* 30.1* 30.1*  MCV 81.7 82.2 83.1 84.3 83.4  PLT 294 272 263 279 253   Basic Metabolic Panel: Recent Labs  Lab 09/18/19 0307 09/19/19 0324 09/20/19 0534 09/21/19 0405 09/22/19 0338  NA 139 138 138 137 137  K 3.5 3.6 3.6 3.5 3.8  CL 110 108 109 108 107  CO2 20* 22 19* 21* 23  GLUCOSE 140* 168* 141* 136* 128*  BUN 19 16 10 11 10   CREATININE 1.08* 0.89 0.69 0.78 0.84  CALCIUM 9.4 9.1 8.7* 8.8* 8.9  MG 1.8 1.9 1.8 1.9 2.0   GFR: Estimated Creatinine Clearance: 53.3 mL/min (by C-G formula based on SCr of 0.84 mg/dL). Liver Function Tests: Recent Labs  Lab 09/18/19 0307 09/19/19 0324 09/20/19 0534 09/21/19 0405 09/22/19 0338  AST 24 22 25 22 23   ALT 27 23 21 21 19   ALKPHOS 87 84 79 75 87  BILITOT 0.6 0.8 0.6 0.3 0.5  PROT 6.0* 5.4* 5.6* 5.5* 5.4*  ALBUMIN 2.8* 2.7* 2.7* 2.5* 2.7*   No results for input(s): LIPASE, AMYLASE in the last 168 hours. No results for input(s): AMMONIA in the last 168 hours. Coagulation Profile: Recent Labs  Lab 09/16/19 2300  INR 1.3*   Cardiac Enzymes: No results for input(s): CKTOTAL, CKMB, CKMBINDEX, TROPONINI in the last 168 hours. BNP (last 3 results) No results for input(s): PROBNP in the last 8760 hours. HbA1C: No results for input(s): HGBA1C in the last 72 hours. CBG: Recent Labs  Lab 09/22/19 1735 09/22/19 2112 09/23/19 0622 09/23/19 0839 09/23/19 1148  GLUCAP 173*  186* 154* 135* 304*   Lipid Profile: No results for input(s): CHOL, HDL, LDLCALC, TRIG, CHOLHDL, LDLDIRECT in the last 72 hours. Thyroid Function Tests: No results for input(s): TSH, T4TOTAL, FREET4, T3FREE, THYROIDAB in the last 72 hours. Anemia Panel: No results for input(s): VITAMINB12, FOLATE, FERRITIN, TIBC, IRON, RETICCTPCT in the last 72 hours. Sepsis Labs: Recent Labs  Lab 09/16/19 2205 09/16/19 2300  LATICACIDVEN 2.2* 2.0*    Recent Results (from the past 240 hour(s))  Blood Culture (routine x 2)     Status: None   Collection Time: 09/16/19 10:22 PM   Specimen: BLOOD LEFT FOREARM  Result Value Ref Range Status   Specimen Description BLOOD LEFT FOREARM  Final   Special Requests   Final    BOTTLES DRAWN AEROBIC AND ANAEROBIC Blood Culture results may not be optimal due to an excessive volume of blood received in culture bottles   Culture   Final    NO GROWTH 5 DAYS Performed at [facility], [address] [place], [facility] [address]  Report Status 09/21/2019 FINAL  Final  Blood Culture (routine x 2)     Status: None   Collection Time: 09/16/19 10:22 PM   Specimen: BLOOD RIGHT HAND  Result Value Ref Range Status   Specimen Description BLOOD RIGHT HAND  Final   Special Requests   Final    BOTTLES DRAWN AEROBIC AND ANAEROBIC Blood Culture results may not be optimal due to an inadequate volume of blood received in culture bottles   Culture   Final    NO GROWTH 5 DAYS Performed at [facility], [address] [place], [place] [address]    Report Status 09/21/2019 FINAL  Final  SARS CORONAVIRUS 2 (TAT 6-24 HRS) Nasopharyngeal Nasopharyngeal Swab     Status: None   Collection Time: 09/16/19 11:56 PM   Specimen: Nasopharyngeal Swab  Result Value Ref Range Status   SARS Coronavirus 2 NEGATIVE NEGATIVE Final    Comment: (NOTE) SARS-CoV-2 target nucleic acids are NOT DETECTED. The SARS-CoV-2 RNA is generally detectable in upper and lower respiratory  specimens during the acute phase of infection. Negative results do not preclude SARS-CoV-2 infection, do not rule out co-infections with other pathogens, and should not be used as the sole basis for treatment or other patient management decisions. Negative results must be combined with clinical observations, patient history, and epidemiological information. The expected result is Negative. Fact Sheet for Patients: HairSlick.nohttps://www.fda.gov/media/138098/download Fact Sheet for Healthcare Providers: quierodirigir.comhttps://www.fda.gov/media/138095/download This test is not yet approved or cleared by the [place] FDA and  has been authorized for detection and/or diagnosis of SARS-CoV-2 by FDA under an Emergency Use Authorization (EUA). This EUA will remain  in effect (meaning this test can be used) for the duration of the COVID-19 declaration under Section 56 4(b)(1) of the Act, 21 U.S.C. section 360bbb-3(b)(1), unless the authorization is terminated or revoked sooner. Performed at [facility], [address] [place], [place] [address]   Urine culture     Status: Abnormal   Collection Time: 09/17/19  1:08 AM   Specimen: Urine, Random  Result Value Ref Range Status   Specimen Description URINE, RANDOM  Final   Special Requests NONE  Final   Culture (A)  Final    <10,000 COLONIES/mL INSIGNIFICANT GROWTH Performed at [facility], [address] [place], [place] [address]    Report Status 09/18/2019 FINAL  Final  MRSA PCR Screening     Status: None   Collection Time: 09/17/19  9:57 PM   Specimen: Nasal Mucosa; Nasopharyngeal  Result Value Ref Range Status   MRSA by PCR NEGATIVE NEGATIVE Final    Comment:        The GeneXpert MRSA Assay (FDA approved for NASAL specimens only), is one component of a comprehensive MRSA colonization surveillance program. It is not intended to diagnose MRSA infection nor to guide or monitor treatment for MRSA infections. Performed at [facility], [address] [place], [place] [address]   SARS CORONAVIRUS 2 (TAT 6-24 HRS) Nasopharyngeal Nasopharyngeal Swab     Status: None   Collection Time: 09/20/19  5:15 PM   Specimen: Nasopharyngeal Swab  Result Value Ref Range Status   SARS Coronavirus 2 NEGATIVE NEGATIVE Final    Comment: (NOTE) SARS-CoV-2 target nucleic acids are NOT DETECTED. The SARS-CoV-2 RNA is generally detectable in upper and lower respiratory specimens during the acute phase of infection. Negative results do not preclude SARS-CoV-2 infection, do not rule out co-infections with other pathogens, and  should not be used as the sole basis for treatment or other patient management decisions. Negative results must be combined with clinical observations, patient history, and epidemiological information. The expected result is Negative. Fact Sheet for Patients: HairSlick.no Fact Sheet for Healthcare Providers: quierodirigir.com This test is not yet approved or cleared by the [place] FDA and  has been authorized for detection and/or diagnosis of SARS-CoV-2 by FDA under an Emergency Use Authorization (EUA). This EUA will remain  in effect (meaning this test can be used) for the duration of the COVID-19 declaration under Section 56 4(b)(1) of the Act, 21 U.S.C. section 360bbb-3(b)(1), unless the authorization is terminated or revoked sooner. Performed at [facility], [address], [place], [place] [address]          Radiology Studies: No results found.      Scheduled Meds: . apixaban  5 mg Oral BID  . aspirin EC  81 mg Oral Daily  . atorvastatin  40 mg Oral q1800  . cephALEXin  500 mg Oral Q8H  . insulin aspart  0-15 Units Subcutaneous TID WC  . insulin glargine  20 Units Subcutaneous QHS  . levothyroxine  50 mcg Oral QAC breakfast  . magnesium oxide  800 mg Oral BID  . mouth rinse  15 mL Mouth Rinse BID  . metoprolol  tartrate  25 mg Oral BID  . polyethylene glycol  17 g Oral BID  . polyvinyl alcohol  1 drop Both Eyes QID  . potassium chloride  10 mEq Oral Daily  . venlafaxine XR  150 mg Oral Q breakfast   Continuous Infusions:    LOS: 6 days    Time spent: 25 minutes    Dorcas Carrow, MD [facility] Pager [phone]

## 2019-09-23 NOTE — Plan of Care (Signed)

## 2019-09-23 NOTE — Progress Notes (Addendum)
  Speech Language Pathology Treatment: Dysphagia  Patient Details Name: [name] MRN: [id number] DOB: 1941-02-01 Today's Date: 09/23/2019 Time: 2353-6144 SLP Time Calculation (min) (ACUTE ONLY): 23 min  Assessment / Plan / Recommendation Clinical Impression  Pt was encountered awake/alert, lying reclined in bed and she was calling out for her husband who was not present at the time.  When asked where she was, the pt stated that she was in her room at home.  SLP re-oriented pt to place and situation.  Pt was seen with trials of thin liquid, pureed solids, and soft solids from her lunch meal tray.  She initially required total assistance for feeding; however, she improved to mod assistance for self feeding given verbal encouragement.  Pt exhibited prolonged, but effective mastication of soft solids and prolonged AP transport with all solid trials.  She additionally presented with impulsivity evidenced by taking multiple large bites in a row despite verbal cues.  Pt benefited from tactile cues to slow rate of intake and to limit bolus size.  No clinical s/sx of aspiration were observed with any trials.  Recommend continuation of Dysphagia 3 (mech soft) solids and thin liquid with full supervision during meals to assist with self-feeding and to cue for compensatory strategies.  SLP will continue to f/u to monitor diet tolerance.     HPI HPI: [name] is a 78 y.o. 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 Of note, pt with R shoulder pain from recent fall. MRI revealed R frontoparietal CVA L arm pain from stable distal radial and ulnar fxs.       SLP Plan  Continue with current plan of care       Recommendations   Diet recommendations: Dysphagia 3 (mechanical soft);Thin liquid Liquids provided via: Cup;Straw Medication Administration: Whole meds with puree Supervision: Staff to assist with self feeding;Full supervision/cueing for compensatory strategies Compensations: Minimize environmental distractions;Slow rate;Small sips/bites Postural Changes and/or Swallow Maneuvers: Seated upright 90 degrees;Upright 30-60 min after meal                Oral Care Recommendations: Oral care BID;Staff/trained caregiver to provide oral care Follow up Recommendations: 24 hour supervision/assistance SLP Visit Diagnosis: Dysphagia, unspecified (R13.10) Plan: Continue with current plan of care       Bretta Bang, M.S., [place] Office: [phone]               [employer] [place] 09/23/2019, 3:37 PM

## 2019-09-24 ENCOUNTER — Encounter (HOSPITAL_COMMUNITY): Payer: Self-pay | Admitting: Emergency Medicine

## 2019-09-24 ENCOUNTER — Inpatient Hospital Stay (HOSPITAL_COMMUNITY): Payer: Medicare Other

## 2019-09-24 DIAGNOSIS — I482 Chronic atrial fibrillation, unspecified: Secondary | ICD-10-CM | POA: Diagnosis not present

## 2019-09-24 DIAGNOSIS — I9589 Other hypotension: Secondary | ICD-10-CM | POA: Diagnosis not present

## 2019-09-24 DIAGNOSIS — E861 Hypovolemia: Secondary | ICD-10-CM | POA: Diagnosis not present

## 2019-09-24 LAB — GLUCOSE, CAPILLARY
Glucose-Capillary: 120 mg/dL — ABNORMAL HIGH (ref 70–99)
Glucose-Capillary: 190 mg/dL — ABNORMAL HIGH (ref 70–99)
Glucose-Capillary: 185 mg/dL — ABNORMAL HIGH (ref 70–99)
Glucose-Capillary: 231 mg/dL — ABNORMAL HIGH (ref 70–99)

## 2019-09-24 MED ORDER — OXYCODONE HCL 5 MG PO TABS
10.0000 mg | ORAL_TABLET | ORAL | Status: DC | PRN
  Administered 2019-09-24 – 2019-09-26 (×4): 10 mg via ORAL
  Filled 2019-09-24 (×4): qty 2

## 2019-09-24 MED ORDER — CYCLOBENZAPRINE HCL 10 MG PO TABS
5.0000 mg | ORAL_TABLET | Freq: Three times a day (TID) | ORAL | Status: DC | PRN
  Administered 2019-09-24 – 2019-09-27 (×4): 5 mg via ORAL
  Filled 2019-09-24 (×4): qty 1

## 2019-09-24 MED ORDER — MORPHINE SULFATE (PF) 4 MG/ML IV SOLN
4.0000 mg | Freq: Once | INTRAVENOUS | Status: AC
  Administered 2019-09-24: 1 mg via INTRAMUSCULAR

## 2019-09-24 MED ORDER — HYDROCODONE-ACETAMINOPHEN 10-325 MG PO TABS: 1.0000 | ORAL_TABLET | ORAL | 0 refills | Status: DC | PRN

## 2019-09-24 MED ORDER — LIDOCAINE 5 % EX PTCH
1.0000 | MEDICATED_PATCH | CUTANEOUS | Status: DC
  Administered 2019-09-24 – 2019-09-28 (×5): 1 via TRANSDERMAL
  Filled 2019-09-24 (×5): qty 1

## 2019-09-24 MED ORDER — OXYCODONE HCL 10 MG PO TABS: 10.0000 mg | ORAL_TABLET | ORAL | 0 refills | Status: DC | PRN

## 2019-09-24 NOTE — Progress Notes (Signed)
PROGRESS NOTE    [name]  OZH:[id number] DOB: 1941/08/03 DOA: 09/16/2019 PCP: Georgina Quint, MD    Brief Narrative:  78 year old female with history of coronary artery disease status post stent, A. fib on Eliquis, history of CVA with residual aphasia and left sided weakness, chronic diastolic heart failure, hypertension, type 2 diabetes, hypothyroidism who presented from a skilled nursing facility at the request of family member.  Patient was not able to provide any history.  Patient's daughter noticed that about a week ago she seemed to have left-sided neglect and some slurred speech. Facility reported no obvious changes from baseline.  In the emergency room she was afebrile, hypotensive with blood pressure 80/60 that improved with fluid resuscitation.  CBC showed no leukocytosis or anemia.  Slightly elevated creatinine.  Urinalysis showed large leukocyte, negative nitrite and many WBC.  She was admitted to the hospital secondary to stroke thought to be due to hypotension from possible UTI.  Improved with antibiotics and IV fluid and supportive care.  Seen by neurology who recommended continuing aspirin and Eliquis. Patient does have chronic pain syndrome, however continues to have neck pain, left upper extremity pain.  Assessment & Plan:   Active Problems:   Diabetes mellitus (HCC)   Chronic diastolic (congestive) heart failure (HCC)   Atrial fibrillation, chronic (HCC)   Hypotension   Cerebral thrombosis with cerebral infarction  Right frontoparietal lobe infarct: Suspect right frontal stroke secondary to progressive right ICA stenosis in the setting of urosepsis and hypotension.  Unable to get MRI due to foreign metal object in the past. CTA of the head and neck showed moderate diffuse narrowing of mid distal cervical right ICA, severe and extensive atherosclerotic disease. Repeat CT scan with interval change in size and appearance of evolving subacute right MCA territory  infarct. Carotid ultrasound with less than 39% stenosis bilaterally. Echocardiogram normal EF.  No PFO. A1c 9.6, LDL 41. Neurology recommendations to continue aspirin and Eliquis.  Avoid dropping blood pressures. Wrist splint on left wrist for pain.  Sepsis secondary to urinary tract infection present on admission: Blood cultures no growth.  Urine cultures with insignificant growth that was collected after antibiotics.  Chest x-ray with no evidence of infection.  Initially treated with broad-spectrum antibiotics and narrowed to ceftriaxone.   She finished 7 days of therapy.  Will discontinue.  Acute kidney injury: Treated with IV fluids and improvement.  Paroxysmal A. fib: Currently rate controlled in sinus rhythm.  On Eliquis.  Type 2 diabetes: A1c 9.6.  Blood sugar is stable on current regimen.  She will continue similar dose of insulin and discussed dietary compliance.  Fall with left distal radius ulna fracture, neck pain and shoulder pain: Patient had fall at his skilled rehab about 2 months ago, found to have left distal radius fracture.  Patient continues to have severe neck pain and splinting.  She is also on chronic pain management. Continue adequate pain medications, local lidocaine patches, muscle relaxants, increased dose of oxycodone. Patient has spasms and splinting on her neck and complaining of severe pain, will recheck her CT scan of the cervical spine today.   DVT prophylaxis: Eliquis Code Status: Full code Family Communication: None Disposition Plan: Skilled nursing facility, pending availability. Family willing to transfer her to a SNF at [place].  If local skilled nursing facility available, she will be transferred.   Consultants:   Neurology  Procedures:   None  Antimicrobials:   Keflex, D7/7   Subjective: Patient seen and  examined.  No overnight events.  Patient continues to have neck pain and spasm.  She is very much worried and distressed with it.   Wants more pain medications.  Objective: Vitals:   09/24/19 0409 09/24/19 0749 09/24/19 0749 09/24/19 1224  BP: 121/72 118/89 118/89 (!) 127/94  Pulse: 85 92 92 (!) 53  Resp: Temp: 98.6 F (37 C) (!) 97.5 F (36.4 C) (!) 97.5 F (36.4 C) 98 F (36.7 C)  TempSrc: Oral  Oral   SpO2: 96% 95% 95% 96%  Weight:      Height:        Intake/Output Summary (Last 24 hours) at 09/24/2019 1354 Last data filed at 09/24/2019 0100 Gross per 24 hour  Intake -  Output 1275 ml  Net -1275 ml   Filed Weights   09/17/19 0200  Weight: 77 kg    Examination:  General exam: Appears calm and comfortable, chronically sick looking.  Anxious and in moderate distress with neck pain. Respiratory system: Clear to auscultation. Respiratory effort normal. Cardiovascular system: S1 & S2 heard, RRR. No JVD, murmurs, rubs, gallops or clicks. No pedal edema. Gastrointestinal system: Abdomen is nondistended, soft and nontender. No organomegaly or masses felt. Normal bowel sounds heard. Central nervous system: Alert and oriented x2-3. Left lower facial weakness.  Right gaze preference.  Left hemiplegia.  Normal on the right side. Pain on palpation of the cervical spine with no deformity.  Data Reviewed: I have personally reviewed following labs and imaging studies  CBC: Recent Labs  Lab 09/18/19 0307 09/19/19 0324 09/20/19 0534 09/21/19 0405 09/22/19 0338  WBC 7.6 7.5 8.5 7.4 7.4  HGB 10.9* 10.3* 10.1* 9.6* 9.9*  HCT 33.0* 31.3* 30.9* 30.1* 30.1*  MCV 81.7 82.2 83.1 84.3 83.4  PLT 294 272 263 279 253   Basic Metabolic Panel: Recent Labs  Lab 09/18/19 0307 09/19/19 0324 09/20/19 0534 09/21/19 0405 09/22/19 0338  NA 139 138 138 137 137  K 3.5 3.6 3.6 3.5 3.8  CL 110 108 109 108 107  CO2 20* 22 19* 21* 23  GLUCOSE 140* 168* 141* 136* 128*  BUN CREATININE 1.08* 0.89 0.69 0.78 0.84  CALCIUM 9.4 9.1 8.7* 8.8* 8.9  MG 1.8 1.9 1.8 1.9 2.0   GFR: Estimated  Creatinine Clearance: 53.3 mL/min (by C-G formula based on SCr of 0.84 mg/dL). Liver Function Tests: Recent Labs  Lab 09/18/19 0307 09/19/19 0324 09/20/19 0534 09/21/19 0405 09/22/19 0338  AST ALT ALKPHOS 87 84 79 75 87  BILITOT 0.6 0.8 0.6 0.3 0.5  PROT 6.0* 5.4* 5.6* 5.5* 5.4*  ALBUMIN 2.8* 2.7* 2.7* 2.5* 2.7*   No results for input(s): LIPASE, AMYLASE in the last 168 hours. No results for input(s): AMMONIA in the last 168 hours. Coagulation Profile: No results for input(s): INR, PROTIME in the last 168 hours. Cardiac Enzymes: No results for input(s): CKTOTAL, CKMB, CKMBINDEX, TROPONINI in the last 168 hours. BNP (last 3 results) No results for input(s): PROBNP in the last 8760 hours. HbA1C: No results for input(s): HGBA1C in the last 72 hours. CBG: Recent Labs  Lab 09/23/19 1148 09/23/19 1647 09/23/19 2128 09/24/19 0607 09/24/19 1203  GLUCAP 304* 145* 189* 120* 190*   Lipid Profile: No results for input(s): CHOL, HDL, LDLCALC, TRIG, CHOLHDL, LDLDIRECT in the last 72 hours. Thyroid Function Tests: No results for input(s): TSH, T4TOTAL, FREET4, T3FREE,  THYROIDAB in the last 72 hours. Anemia Panel: No results for input(s): VITAMINB12, FOLATE, FERRITIN, TIBC, IRON, RETICCTPCT in the last 72 hours. Sepsis Labs: No results for input(s): PROCALCITON, LATICACIDVEN in the last 168 hours.  Recent Results (from the past 240 hour(s))  Blood Culture (routine x 2)     Status: None   Collection Time: 09/16/19 10:22 PM   Specimen: BLOOD LEFT FOREARM  Result Value Ref Range Status   Specimen Description BLOOD LEFT FOREARM  Final   Special Requests   Final    BOTTLES DRAWN AEROBIC AND ANAEROBIC Blood Culture results may not be optimal due to an excessive volume of blood received in culture bottles   Culture   Final    NO GROWTH 5 DAYS Performed at [facility], [address] [place], [place] [address]    Report Status 09/21/2019 FINAL   Final  Blood Culture (routine x 2)     Status: None   Collection Time: 09/16/19 10:22 PM   Specimen: BLOOD RIGHT HAND  Result Value Ref Range Status   Specimen Description BLOOD RIGHT HAND  Final   Special Requests   Final    BOTTLES DRAWN AEROBIC AND ANAEROBIC Blood Culture results may not be optimal due to an inadequate volume of blood received in culture bottles   Culture   Final    NO GROWTH 5 DAYS Performed at [facility], [address], [place], [place] [address]    Report Status 09/21/2019 FINAL  Final  SARS CORONAVIRUS 2 (TAT 6-24 HRS) Nasopharyngeal Nasopharyngeal Swab     Status: None   Collection Time: 09/16/19 11:56 PM   Specimen: Nasopharyngeal Swab  Result Value Ref Range Status   SARS Coronavirus 2 NEGATIVE NEGATIVE Final    Comment: (NOTE) SARS-CoV-2 target nucleic acids are NOT DETECTED. The SARS-CoV-2 RNA is generally detectable in upper and lower respiratory specimens during the acute phase of infection. Negative results do not preclude SARS-CoV-2 infection, do not rule out co-infections with other pathogens, and should not be used as the sole basis for treatment or other patient management decisions. Negative results must be combined with clinical observations, patient history, and epidemiological information. The expected result is Negative. Fact Sheet for Patients: HairSlick.no Fact Sheet for Healthcare Providers: quierodirigir.com This test is not yet approved or cleared by the [place] FDA and  has been authorized for detection and/or diagnosis of SARS-CoV-2 by FDA under an Emergency Use Authorization (EUA). This EUA will remain  in effect (meaning this test can be used) for the duration of the COVID-19 declaration under Section 56 4(b)(1) of the Act, 21 U.S.C. section 360bbb-3(b)(1), unless the authorization is terminated or revoked sooner. Performed at [facility], [address] [place], [place] [address]   Urine culture     Status: Abnormal   Collection Time: 09/17/19  1:08 AM   Specimen: Urine, Random  Result Value Ref Range Status   Specimen Description URINE, RANDOM  Final   Special Requests NONE  Final   Culture (A)  Final    <10,000 COLONIES/mL INSIGNIFICANT GROWTH Performed at [facility], [address], [place], [place] [address]    Report Status 09/18/2019 FINAL  Final  MRSA PCR Screening     Status: None   Collection Time: 09/17/19  9:57 PM   Specimen: Nasal Mucosa; Nasopharyngeal  Result Value Ref Range Status   MRSA by PCR NEGATIVE NEGATIVE Final    Comment:  The GeneXpert MRSA Assay (FDA approved for NASAL specimens only), is one component of a comprehensive MRSA colonization surveillance program. It is not intended to diagnose MRSA infection nor to guide or monitor treatment for MRSA infections. Performed at [facility], [place] [address], [place], [place] [address]   SARS CORONAVIRUS 2 (TAT 6-24 HRS) Nasopharyngeal Nasopharyngeal Swab     Status: None   Collection Time: 09/20/19  5:15 PM   Specimen: Nasopharyngeal Swab  Result Value Ref Range Status   SARS Coronavirus 2 NEGATIVE NEGATIVE Final    Comment: (NOTE) SARS-CoV-2 target nucleic acids are NOT DETECTED. The SARS-CoV-2 RNA is generally detectable in upper and lower respiratory specimens during the acute phase of infection. Negative results do not preclude SARS-CoV-2 infection, do not rule out co-infections with other pathogens, and should not be used as the sole basis for treatment or other patient management decisions. Negative results must be combined with clinical observations, patient history, and epidemiological information. The expected result is Negative. Fact Sheet for Patients: SugarRoll.be Fact Sheet for Healthcare Providers: https://www.woods-mathews.com/ This test is not yet approved or cleared by  the [place] FDA and  has been authorized for detection and/or diagnosis of SARS-CoV-2 by FDA under an Emergency Use Authorization (EUA). This EUA will remain  in effect (meaning this test can be used) for the duration of the COVID-19 declaration under Section 56 4(b)(1) of the Act, 21 U.S.C. section 360bbb-3(b)(1), unless the authorization is terminated or revoked sooner. Performed at [facility], [place] [address] [place], [place] [address]   SARS CORONAVIRUS 2 (TAT 6-24 HRS) Nasopharyngeal Nasopharyngeal Swab     Status: None   Collection Time: 09/23/19 11:50 AM   Specimen: Nasopharyngeal Swab  Result Value Ref Range Status   SARS Coronavirus 2 NEGATIVE NEGATIVE Final    Comment: (NOTE) SARS-CoV-2 target nucleic acids are NOT DETECTED. The SARS-CoV-2 RNA is generally detectable in upper and lower respiratory specimens during the acute phase of infection. Negative results do not preclude SARS-CoV-2 infection, do not rule out co-infections with other pathogens, and should not be used as the sole basis for treatment or other patient management decisions. Negative results must be combined with clinical observations, patient history, and epidemiological information. The expected result is Negative. Fact Sheet for Patients: SugarRoll.be Fact Sheet for Healthcare Providers: https://www.woods-mathews.com/ This test is not yet approved or cleared by the [place] FDA and  has been authorized for detection and/or diagnosis of SARS-CoV-2 by FDA under an Emergency Use Authorization (EUA). This EUA will remain  in effect (meaning this test can be used) for the duration of the COVID-19 declaration under Section 56 4(b)(1) of the Act, 21 U.S.C. section 360bbb-3(b)(1), unless the authorization is terminated or revoked sooner. Performed at [facility], [place] [address], [place], [place] [address]          Radiology Studies:  No results found.      Scheduled Meds: . apixaban  5 mg Oral BID  . aspirin EC  81 mg Oral Daily  . atorvastatin  40 mg Oral q1800  . insulin aspart  0-15 Units Subcutaneous TID WC  . insulin glargine  20 Units Subcutaneous QHS  . levothyroxine  50 mcg Oral QAC breakfast  . lidocaine  1 patch Transdermal Q24H  . magnesium oxide  800 mg Oral BID  . mouth rinse  15 mL Mouth Rinse BID  . metoprolol tartrate  25 mg Oral BID  . polyethylene glycol  17 g Oral BID  . polyvinyl alcohol  1 drop Both Eyes QID  . potassium chloride  10 mEq Oral Daily  . venlafaxine XR  150 mg Oral Q breakfast   Continuous Infusions:    LOS: 7 days    Time spent: 25 minutes    Dorcas CarrowKuber Kennethia Lynes, MD [facility] Pager [phone]

## 2019-09-24 NOTE — Progress Notes (Signed)
Physical Therapy Treatment Patient Details Name: [name] MRN: [id number] DOB: 28-Aug-1941 Today's Date: 09/24/2019    History of Present Illness [name] is a 78 y.o. 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 Of note, pt with R shoulder pain from recent fall. MRI revealed R frontoparietal CVA L arm pain from stable distal radial and ulnar fxs.     PT Comments    Pt was limited by significant pain in R side of neck during session. Pt currently requires max<>total assist +1 for bed mobility & extra time to complete all movements but pt willing to participate and even attempt standing EOB. While sitting EOB pt reports need return to bed pan & reports not "feeling right" but pt unable to elaborate so assisted back supine & RN made aware; BP = 127/94 mmHg.  Pt would benefit from further PT treatment while in acute setting to focus on activity tolerance, bed mobility, transfers, and gait as able. Current d/c recommendations of SNF remains appropriate.  While pt sits EOB pt performed reaching outside of BOS 1-2 to focus on dynamic sitting balance. Pt also continues to demonstrate R gaze preference, and does not even attempt to turn eyes nor head to L to look at therapist; because of this, question a musculoskeletal component attributing to pt's pain as she demonstrates R gaze preference at all times & does not turn head to midline.    Follow Up Recommendations  SNF;Supervision/Assistance - 24 hour     Equipment Recommendations  None recommended by PT    Recommendations for Other Services       Precautions / Restrictions Precautions Precautions: Fall Precaution Comments: Per chart, multiple falls at home.   Required Braces or Orthoses: Splint/Cast Splint/Cast: R wrist splint Restrictions Weight Bearing Restrictions: No Other Position/Activity Restrictions: Pt with stable LUE ulnar and radial fx    Mobility  Bed Mobility Overal bed mobility: Needs Assistance Bed Mobility: Supine to Sit;Sit to Supine     Supine to sit: Max assist;HOB elevated Sit to supine: Total assist   General bed mobility comments: elevated HOB, bed rails, max multimodal cuing to initiate moving BLE to EOB, pt with little ability to pull self upright with bed rail, requiring max assist to upright trunk  Transfers Overall transfer level: Needs assistance Equipment used: Rolling walker (2 wheeled) Transfers: Sit to/from [employer] transfer comment: attempted sit>stand from elevated EOB with max cuing for hand placement & sequencing but pt unable to transfer to standing upright even with max assist +1 & pt reports need to use bed pan to returned to sitting EOB  Ambulation/Gait                 Stairs             Wheelchair Mobility    Modified Rankin (Stroke Patients Only) Modified Rankin (Stroke Patients Only) Pre-Morbid Rankin Score: Moderately severe disability Modified Rankin: Severe disability     Balance Overall balance assessment: Needs assistance Sitting-balance support: Feet unsupported(RUE supported) Sitting balance-Leahy Scale: Fair Sitting balance - Comments: supervision for static sitting balance  Cognition Arousal/Alertness: Awake/alert Behavior During Therapy: Anxious Overall Cognitive Status: No family/caregiver present to determine baseline cognitive functioning                                        Exercises      General Comments        Pertinent Vitals/Pain Pain Assessment: Faces Faces Pain Scale: Hurts worst Pain Location: neck Pain Descriptors /  Indicators: Discomfort;Grimacing(pt crying out in pain at rest & with movement) Pain Intervention(s): Monitored during session;Repositioned;Premedicated before session(distraction techniques)    Home Living                      Prior Function            PT Goals (current goals can now be found in the care plan section) Acute Rehab PT Goals Patient Stated Goal: decrease pain PT Goal Formulation: With patient Time For Goal Achievement: 10/02/19 Potential to Achieve Goals: Fair Progress towards PT goals: Progressing toward goals    Frequency    Min 3X/week      PT Plan Current plan remains appropriate    Co-evaluation              AM-PAC PT "6 Clicks" Mobility   Outcome Measure  Help needed turning from your back to your side while in a flat bed without using bedrails?: A Lot Help needed moving from lying on your back to sitting on the side of a flat bed without using bedrails?: A Lot Help needed moving to and from a bed to a chair (including a wheelchair)?: Total Help needed standing up from a chair using your arms (e.g., wheelchair or bedside chair)?: Total Help needed to walk in hospital room?: Total Help needed climbing 3-5 steps with a railing? : Total 6 Click Score: 8    End of Session Equipment Utilized During Treatment: Gait belt Activity Tolerance: Patient limited by pain Patient left: in bed;with call bell/phone within reach;with bed alarm set;with nursing/sitter in room Nurse Communication: Mobility status PT Visit Diagnosis: History of falling (Z91.81);Muscle weakness (generalized) (M62.81);Repeated falls (R29.6);Unsteadiness on feet (R26.81);Pain Pain - Right/Left: Right Pain - part of body: (neck)     Time: 0240-9735 PT Time Calculation (min) (ACUTE ONLY): 19 min  Charges:  $Therapeutic Activity: 8-22 mins(19)                         Sandi Mariscal, PT, DPT 09/24/2019, 12:53 PM

## 2019-09-24 NOTE — TOC Progression Note (Signed)
Transition of Care [facility]) - Progression Note    Patient Details  Name: [name] MRN: [id number] Date of Birth: December 01, 1940  [facility] CM/SW Contact  Pollie Friar, RN Phone Number: 09/24/2019, 3:16 PM  Clinical Narrative:    Continue searching for a SNF that family is agreeable to. CM has sent her information to multiple SNF in [place], [place], [place] and [place]. She has a bed offer in [place] but spouse is unsure about her going that far.  [facility], [facility] and [facility] wont have beds until Tuesday on next week.  Daughter has been updated on all the information provided from the SNFs.  Daughter is currently refusing: [facility], [facility], [facility] and [place], [place]. Daughter is aware pt is medically ready for d/c.  If daughter selects to have her d/c to [place] at [place] she is aware she will have to pay the transportation expenses and she has reached out to the transportation companies.  TOC following..   Expected Discharge Plan: Skilled Nursing Facility Barriers to Discharge: Family Issues, Other (comment)(Unable to communicate with daughter)  Expected Discharge Plan and Services Expected Discharge Plan: [place] In-house Referral: Clinical Social Work   Post Acute Care Choice: [place] Living arrangements for the past 2 months: [place], Single Family Home                                       Social Determinants of Health (SDOH) Interventions    Readmission Risk Interventions No flowsheet data found.

## 2019-09-24 NOTE — Progress Notes (Addendum)
Patient was complaining of the worst neck pain ever; states neck pain is not new but just much worse the last two days; MD made aware; continue prn medications for pain.  Patient repositioned and heat packs applied behind the neck.

## 2019-09-25 DIAGNOSIS — E861 Hypovolemia: Secondary | ICD-10-CM | POA: Diagnosis not present

## 2019-09-25 DIAGNOSIS — I482 Chronic atrial fibrillation, unspecified: Secondary | ICD-10-CM | POA: Diagnosis not present

## 2019-09-25 DIAGNOSIS — I9589 Other hypotension: Secondary | ICD-10-CM | POA: Diagnosis not present

## 2019-09-25 LAB — GLUCOSE, CAPILLARY
Glucose-Capillary: 153 mg/dL — ABNORMAL HIGH (ref 70–99)
Glucose-Capillary: 235 mg/dL — ABNORMAL HIGH (ref 70–99)
Glucose-Capillary: 181 mg/dL — ABNORMAL HIGH (ref 70–99)
Glucose-Capillary: 168 mg/dL — ABNORMAL HIGH (ref 70–99)

## 2019-09-25 NOTE — Progress Notes (Signed)
PROGRESS NOTE    [name]  ZOX:[id number] DOB: [birth year] DOA: 09/16/2019 PCP: Georgina QuintSagardia, Miguel Jose, MD    Brief Narrative:  78 year old female with history of coronary artery disease status post stent, A. fib on Eliquis, history of CVA with residual aphasia and left sided weakness, chronic diastolic heart failure, hypertension, type 2 diabetes, hypothyroidism who presented from a skilled nursing facility at the request of family member. Patient's daughter noticed that about a week ago she seemed to have left-sided neglect and some slurred speech. Facility reported no obvious changes from baseline.  In the emergency room she was afebrile, hypotensive with blood pressure 80/60 that improved with fluid resuscitation.  CBC showed no leukocytosis or anemia.  Slightly elevated creatinine.  Urinalysis showed large leukocyte, negative nitrite and many WBC.  She was admitted to the hospital secondary to stroke thought to be due to hypotension from possible UTI.  Improved with antibiotics and IV fluid and supportive care.  Seen by neurology who recommended continuing aspirin and Eliquis. Patient does have chronic pain syndrome, however continues to have neck pain, left upper extremity pain, some relief with increasing dose of oxycodone.  Assessment & Plan:   Active Problems:   Diabetes mellitus (HCC)   Chronic diastolic (congestive) heart failure (HCC)   Atrial fibrillation, chronic (HCC)   Hypotension   Cerebral thrombosis with cerebral infarction  Right frontoparietal lobe infarct: Suspect right frontal stroke secondary to progressive right ICA stenosis in the setting of urosepsis and hypotension.  Unable to get MRI due to foreign metal object in the past. CTA of the head and neck showed moderate diffuse narrowing of mid distal cervical right ICA, severe and extensive atherosclerotic disease. Repeat CT scan with interval change in size and appearance of evolving subacute right MCA territory  infarct. Carotid ultrasound with less than 39% stenosis bilaterally. Echocardiogram normal EF.  No PFO. A1c 9.6, LDL 41. Neurology recommendations to continue aspirin and Eliquis.  Avoid dropping blood pressures. Wrist splint on left wrist for pain.  Sepsis secondary to urinary tract infection present on admission: Blood cultures no growth.  Urine cultures with insignificant growth that was collected after antibiotics.  Chest x-ray with no evidence of infection.  Initially treated with broad-spectrum antibiotics and narrowed to ceftriaxone.   She finished 7 days of therapy.    Acute kidney injury: Treated with IV fluids and improvement.  Paroxysmal A. fib: Currently rate controlled in sinus rhythm.  On Eliquis and aspirin.  Type 2 diabetes: A1c 9.6.  Blood sugar is stable on current regimen.  She will continue similar dose of insulin and discussed dietary compliance.  Fall with left distal radius ulna fracture, neck pain and shoulder pain: Patient had fall at skilled rehab about 2 months ago, found to have left distal radius fracture.  Patient continues to have severe neck pain and splinting.  She is also on chronic pain management. Continue adequate pain medications, local lidocaine patches, muscle relaxants, increased dose of oxycodone. With persistent spasm and neck pain, a repeat CT scan of the cervical spine was done and is stable.  DVT prophylaxis: Eliquis Code Status: Full code Family Communication: None , daughter was unable to pick up phone. Disposition Plan: Skilled nursing facility, pending availability. Family willing to transfer her to a SNF at [place].     Consultants:   Neurology  Procedures:   None  Antimicrobials:   Keflex, D7/7   Subjective: No overnight events.  Neck pain persist, somehow better after increasing dose  of oxycodone.  No other complaints.  Objective: Vitals:   09/24/19 2028 09/25/19 0018 09/25/19 0324 09/25/19 0921  BP: 114/84 116/81  112/67 131/77  Pulse: 72 77 79 67  Resp: 18 16 16 14   Temp: 98.2 F (36.8 C) 98 F (36.7 C) 98.1 F (36.7 C) 98.5 F (36.9 C)  TempSrc: Oral Oral Oral Oral  SpO2: 99% 99% 99% 98%  Weight:      Height:        Intake/Output Summary (Last 24 hours) at 09/25/2019 1158 Last data filed at 09/24/2019 2031 Gross per 24 hour  Intake --  Output 1200 ml  Net -1200 ml   Filed Weights   09/17/19 0200  Weight: 77 kg    Examination:  General exam: Appears calm and comfortable, chronically sick looking.  Anxious and in moderate distress with neck pain. Respiratory system: Clear to auscultation. Respiratory effort normal. Cardiovascular system: S1 & S2 heard, RRR. No JVD, murmurs, rubs, gallops or clicks. No pedal edema. Gastrointestinal system: Abdomen is nondistended, soft and nontender. No organomegaly or masses felt. Normal bowel sounds heard. Central nervous system: Alert and oriented x2-3. Left lower facial weakness.  Right gaze preference.  Left hemiplegia.  Normal on the right side. Pain on palpation of the cervical spine with no deformity.  Data Reviewed: I have personally reviewed following labs and imaging studies  CBC: Recent Labs  Lab 09/19/19 0324 09/20/19 0534 09/21/19 0405 09/22/19 0338  WBC 7.5 8.5 7.4 7.4  HGB 10.3* 10.1* 9.6* 9.9*  HCT 31.3* 30.9* 30.1* 30.1*  MCV 82.2 83.1 84.3 83.4  PLT 272 263 279 253   Basic Metabolic Panel: Recent Labs  Lab 09/19/19 0324 09/20/19 0534 09/21/19 0405 09/22/19 0338  NA 138 138 137 137  K 3.6 3.6 3.5 3.8  CL 108 109 108 107  CO2 22 19* 21* 23  GLUCOSE 168* 141* 136* 128*  BUN 16 10 11 10   CREATININE 0.89 0.69 0.78 0.84  CALCIUM 9.1 8.7* 8.8* 8.9  MG 1.9 1.8 1.9 2.0   GFR: Estimated Creatinine Clearance: 53.3 mL/min (by C-G formula based on SCr of 0.84 mg/dL). Liver Function Tests: Recent Labs  Lab 09/19/19 0324 09/20/19 0534 09/21/19 0405 09/22/19 0338  AST 22 25 22 23   ALT 23 21 21 19   ALKPHOS 84 79  75 87  BILITOT 0.8 0.6 0.3 0.5  PROT 5.4* 5.6* 5.5* 5.4*  ALBUMIN 2.7* 2.7* 2.5* 2.7*   No results for input(s): LIPASE, AMYLASE in the last 168 hours. No results for input(s): AMMONIA in the last 168 hours. Coagulation Profile: No results for input(s): INR, PROTIME in the last 168 hours. Cardiac Enzymes: No results for input(s): CKTOTAL, CKMB, CKMBINDEX, TROPONINI in the last 168 hours. BNP (last 3 results) No results for input(s): PROBNP in the last 8760 hours. HbA1C: No results for input(s): HGBA1C in the last 72 hours. CBG: Recent Labs  Lab 09/24/19 0607 09/24/19 1203 09/24/19 1626 09/24/19 2013 09/25/19 0620  GLUCAP 120* 190* 185* 231* 153*   Lipid Profile: No results for input(s): CHOL, HDL, LDLCALC, TRIG, CHOLHDL, LDLDIRECT in the last 72 hours. Thyroid Function Tests: No results for input(s): TSH, T4TOTAL, FREET4, T3FREE, THYROIDAB in the last 72 hours. Anemia Panel: No results for input(s): VITAMINB12, FOLATE, FERRITIN, TIBC, IRON, RETICCTPCT in the last 72 hours. Sepsis Labs: No results for input(s): PROCALCITON, LATICACIDVEN in the last 168 hours.  Recent Results (from the past 240 hour(s))  Blood Culture (routine x 2)  Status: None   Collection Time: 09/16/19 10:22 PM   Specimen: BLOOD LEFT FOREARM  Result Value Ref Range Status   Specimen Description BLOOD LEFT FOREARM  Final   Special Requests   Final    BOTTLES DRAWN AEROBIC AND ANAEROBIC Blood Culture results may not be optimal due to an excessive volume of blood received in culture bottles   Culture   Final    NO GROWTH 5 DAYS Performed at [facility], [address] [place], [place] [address]    Report Status 09/21/2019 FINAL  Final  Blood Culture (routine x 2)     Status: None   Collection Time: 09/16/19 10:22 PM   Specimen: BLOOD RIGHT HAND  Result Value Ref Range Status   Specimen Description BLOOD RIGHT HAND  Final   Special Requests   Final    BOTTLES DRAWN AEROBIC AND ANAEROBIC  Blood Culture results may not be optimal due to an inadequate volume of blood received in culture bottles   Culture   Final    NO GROWTH 5 DAYS Performed at [facility], [address], [place], [place] [address]    Report Status 09/21/2019 FINAL  Final  SARS CORONAVIRUS 2 (TAT 6-24 HRS) Nasopharyngeal Nasopharyngeal Swab     Status: None   Collection Time: 09/16/19 11:56 PM   Specimen: Nasopharyngeal Swab  Result Value Ref Range Status   SARS Coronavirus 2 NEGATIVE NEGATIVE Final    Comment: (NOTE) SARS-CoV-2 target nucleic acids are NOT DETECTED. The SARS-CoV-2 RNA is generally detectable in upper and lower respiratory specimens during the acute phase of infection. Negative results do not preclude SARS-CoV-2 infection, do not rule out co-infections with other pathogens, and should not be used as the sole basis for treatment or other patient management decisions. Negative results must be combined with clinical observations, patient history, and epidemiological information. The expected result is Negative. Fact Sheet for Patients: HairSlick.no Fact Sheet for Healthcare Providers: quierodirigir.com This test is not yet approved or cleared by the [place] FDA and  has been authorized for detection and/or diagnosis of SARS-CoV-2 by FDA under an Emergency Use Authorization (EUA). This EUA will remain  in effect (meaning this test can be used) for the duration of the COVID-19 declaration under Section 56 4(b)(1) of the Act, 21 U.S.C. section 360bbb-3(b)(1), unless the authorization is terminated or revoked sooner. Performed at [facility], [address], [place], [place] [address]   Urine culture     Status: Abnormal   Collection Time: 09/17/19  1:08 AM   Specimen: Urine, Random  Result Value Ref Range Status   Specimen Description URINE, RANDOM  Final   Special Requests NONE  Final   Culture (A)  Final     <10,000 COLONIES/mL INSIGNIFICANT GROWTH Performed at [facility], [address], [place], [place] [address]    Report Status 09/18/2019 FINAL  Final  MRSA PCR Screening     Status: None   Collection Time: 09/17/19  9:57 PM   Specimen: Nasal Mucosa; Nasopharyngeal  Result Value Ref Range Status   MRSA by PCR NEGATIVE NEGATIVE Final    Comment:        The GeneXpert MRSA Assay (FDA approved for NASAL specimens only), is one component of a comprehensive MRSA colonization surveillance program. It is not intended to diagnose MRSA infection nor to guide or monitor treatment for MRSA infections. Performed at [facility], [address], [place],  [place] [address]   SARS CORONAVIRUS 2 (TAT 6-24 HRS) Nasopharyngeal Nasopharyngeal Swab     Status: None   Collection Time: 09/20/19  5:15 PM   Specimen: Nasopharyngeal Swab  Result Value Ref Range Status   SARS Coronavirus 2 NEGATIVE NEGATIVE Final    Comment: (NOTE) SARS-CoV-2 target nucleic acids are NOT DETECTED. The SARS-CoV-2 RNA is generally detectable in upper and lower respiratory specimens during the acute phase of infection. Negative results do not preclude SARS-CoV-2 infection, do not rule out co-infections with other pathogens, and should not be used as the sole basis for treatment or other patient management decisions. Negative results must be combined with clinical observations, patient history, and epidemiological information. The expected result is Negative. Fact Sheet for Patients: HairSlick.no Fact Sheet for Healthcare Providers: quierodirigir.com This test is not yet approved or cleared by the [place] FDA and  has been authorized for detection and/or diagnosis of SARS-CoV-2 by FDA under an Emergency Use Authorization (EUA). This EUA will remain  in effect (meaning this test can be used) for the duration of the COVID-19 declaration under Section  56 4(b)(1) of the Act, 21 U.S.C. section 360bbb-3(b)(1), unless the authorization is terminated or revoked sooner. Performed at [facility], [address], [place], [place] [address]   SARS CORONAVIRUS 2 (TAT 6-24 HRS) Nasopharyngeal Nasopharyngeal Swab     Status: None   Collection Time: 09/23/19 11:50 AM   Specimen: Nasopharyngeal Swab  Result Value Ref Range Status   SARS Coronavirus 2 NEGATIVE NEGATIVE Final    Comment: (NOTE) SARS-CoV-2 target nucleic acids are NOT DETECTED. The SARS-CoV-2 RNA is generally detectable in upper and lower respiratory specimens during the acute phase of infection. Negative results do not preclude SARS-CoV-2 infection, do not rule out co-infections with other pathogens, and should not be used as the sole basis for treatment or other patient management decisions. Negative results must be combined with clinical observations, patient history, and epidemiological information. The expected result is Negative. Fact Sheet for Patients: HairSlick.no Fact Sheet for Healthcare Providers: quierodirigir.com This test is not yet approved or cleared by the [place] FDA and  has been authorized for detection and/or diagnosis of SARS-CoV-2 by FDA under an Emergency Use Authorization (EUA). This EUA will remain  in effect (meaning this test can be used) for the duration of the COVID-19 declaration under Section 56 4(b)(1) of the Act, 21 U.S.C. section 360bbb-3(b)(1), unless the authorization is terminated or revoked sooner. Performed at [facility], [address] [place], [place] [address]          Radiology Studies: Ct Cervical Spine Wo Contrast  Result Date: 09/24/2019 CLINICAL DATA:  Central pain syndrome. Previous fall/trauma. Severe radicular pain. EXAM: CT CERVICAL SPINE WITHOUT CONTRAST TECHNIQUE: Multidetector CT imaging of the cervical spine was performed without  intravenous contrast. Multiplanar CT image reconstructions were also generated. COMPARISON:  07/24/2019 FINDINGS: Alignment: Straightening of the normal cervical lordosis. No subluxation. Skull base and vertebrae: Vertebral body heights are maintained. There is mild spondylosis throughout the cervical spine. There is uncovertebral joint spurring and facet arthropathy. Non fusion of the midline posterior arch of C1. Moderate bilateral neural foraminal narrowing from the C4-5 level to the C6-7 level. No evidence of acute fracture or subluxation. Soft tissues and spinal canal: No prevertebral fluid or swelling. No visible canal hematoma. Disc levels: Moderate disc space narrowing from the C4-5 level to the C6-7 level. Upper chest: No acute findings. Other: None. IMPRESSION: 1.  No acute findings. 2. Mild spondylosis throughout the cervical spine. Disc disease from the C4-5 level to the C6-7 level. Bilateral neural foraminal narrowing from the C4-5 to the C6-7 level. Electronically Signed   By: Marin Olp M.D.   On: 09/24/2019 15:29        Scheduled Meds:  apixaban  5 mg Oral BID   aspirin EC  81 mg Oral Daily   atorvastatin  40 mg Oral q1800   insulin aspart  0-15 Units Subcutaneous TID WC   insulin glargine  20 Units Subcutaneous QHS   levothyroxine  50 mcg Oral QAC breakfast   lidocaine  1 patch Transdermal Q24H   magnesium oxide  800 mg Oral BID   mouth rinse  15 mL Mouth Rinse BID   metoprolol tartrate  25 mg Oral BID   polyethylene glycol  17 g Oral BID   polyvinyl alcohol  1 drop Both Eyes QID   potassium chloride  10 mEq Oral Daily   venlafaxine XR  150 mg Oral Q breakfast   Continuous Infusions:    LOS: 8 days    Time spent: 25 minutes    Barb Merino, MD [facility] Pager [phone]

## 2019-09-25 NOTE — TOC Progression Note (Signed)
Transition of Care [facility]) - Progression Note    Patient Details  Name: [name] MRN: [id number] Date of Birth: July 16, 1941  Transition of Care [facility] CM/SW [place], [place] Phone Number: 09/25/2019, 10:54 AM  Clinical Narrative:     No current bed offers in the area. Patient has a bed offer in [place]. CSW is awaiting to hear back from the family regarding placement decision.   CSW will continue to follow and assist with disposition placement.   Expected Discharge Plan: Skilled Nursing Facility Barriers to Discharge: Family Issues, Other (comment)(Unable to communicate with daughter)  Expected Discharge Plan and Services Expected Discharge Plan: [place] In-house Referral: Clinical Social Work   Post Acute Care Choice: [place] Living arrangements for the past 2 months: [place], Single Family Home                                       Social Determinants of Health (SDOH) Interventions    Readmission Risk Interventions No flowsheet data found.

## 2019-09-26 LAB — GLUCOSE, CAPILLARY
Glucose-Capillary: 218 mg/dL — ABNORMAL HIGH (ref 70–99)
Glucose-Capillary: 219 mg/dL — ABNORMAL HIGH (ref 70–99)

## 2019-09-26 MED ORDER — GABAPENTIN 100 MG PO CAPS
100.0000 mg | ORAL_CAPSULE | Freq: Three times a day (TID) | ORAL | Status: DC
  Administered 2019-09-26 – 2019-09-28 (×7): 100 mg via ORAL
  Filled 2019-09-26 (×7): qty 1

## 2019-09-26 MED ORDER — OXYCODONE HCL 5 MG PO TABS
5.0000 mg | ORAL_TABLET | ORAL | Status: DC | PRN
  Administered 2019-09-26 – 2019-09-28 (×4): 5 mg via ORAL
  Filled 2019-09-26 (×4): qty 1

## 2019-09-26 NOTE — Progress Notes (Signed)
PROGRESS NOTE    [name]  ZOX:[id number] DOB: 06-12-1941 DOA: 09/16/2019 PCP: Georgina QuintSagardia, Miguel Jose, MD    Brief Narrative:  78 year old female with history of coronary artery disease status post stent, A. fib on Eliquis, history of CVA with residual aphasia and left sided weakness, chronic diastolic heart failure, hypertension, type 2 diabetes, hypothyroidism who presented from a skilled nursing facility at the request of family member. Patient's daughter noticed that about a week ago she seemed to have left-sided neglect and some slurred speech. Facility reported no obvious changes from baseline.  In the emergency room she was afebrile, hypotensive with blood pressure 80/60 that improved with fluid resuscitation.  CBC showed no leukocytosis or anemia.  Slightly elevated creatinine.  Urinalysis showed large leukocyte, negative nitrite and many WBC.  She was admitted to the hospital secondary to stroke thought to be due to hypotension from possible UTI.  Improved with antibiotics and IV fluid and supportive care.  Seen by neurology who recommended continuing aspirin and Eliquis. Patient does have chronic pain syndrome, however continues to have neck pain, left upper extremity pain.  Assessment & Plan:   Active Problems:   Diabetes mellitus (HCC)   Chronic diastolic (congestive) heart failure (HCC)   Atrial fibrillation, chronic (HCC)   Hypotension   Cerebral thrombosis with cerebral infarction  Right frontoparietal lobe infarct: Suspect right frontal stroke secondary to progressive right ICA stenosis in the setting of urosepsis and hypotension.  Unable to get MRI due to foreign metal object in the past. CTA of the head and neck showed moderate diffuse narrowing of mid distal cervical right ICA, severe and extensive atherosclerotic disease. Repeat CT scan with interval change in size and appearance of evolving subacute right MCA territory infarct. Carotid ultrasound with less than 39%  stenosis bilaterally. Echocardiogram normal EF.  No PFO. A1c 9.6, LDL 41. Neurology recommendations to continue aspirin and Eliquis.  Avoid dropping blood pressures. Wrist splint on left wrist for pain.  Sepsis secondary to urinary tract infection present on admission: Blood cultures no growth.  Urine cultures with insignificant growth that was collected after antibiotics.  Chest x-ray with no evidence of infection.  Initially treated with broad-spectrum antibiotics and narrowed to ceftriaxone.   She finished 7 days of therapy.    Acute kidney injury: Treated with IV fluids and improvement.  Paroxysmal A. fib: Currently rate controlled in sinus rhythm.  On Eliquis and aspirin.  Type 2 diabetes: A1c 9.6.  Blood sugar is stable on current regimen.  She will continue similar dose of insulin and discussed dietary compliance.  Fall with left distal radius ulna fracture, neck pain and shoulder pain: Patient had fall at skilled rehab about 2 months ago, found to have left distal radius fracture.  Patient continues to have severe neck pain and splinting.  She is also on chronic pain management. Continue adequate pain medications, local lidocaine patches, muscle relaxants, increased dose of oxycodone. With persistent spasm and neck pain, a repeat CT scan of the cervical spine was done and is stable. Patient continues to require pain medications.  Will decrease oxycodone to 5 mg every 4 hours, add Flexeril and gabapentin.  DVT prophylaxis: Eliquis Code Status: Full code Family Communication: None , daughter was unable to pick up phone on multiple attempts. Disposition Plan: Skilled nursing facility, pending availability. Family willing to transfer her to a SNF at [place].     Consultants:   Neurology  Procedures:   None  Antimicrobials:   Keflex,  D7/7   Subjective: No overnight events.  Continues to complain of neck pain, muscle pain, left arm pain.  Objective: Vitals:   09/25/19  2023 09/25/19 2359 09/26/19 0426 09/26/19 0800  BP: (!) 116/93 94/82 115/89 126/88  Pulse: (!) 105 100 96 96  Resp: Temp: 99 F (37.2 C) 98.3 F (36.8 C) 98 F (36.7 C) 98 F (36.7 C)  TempSrc: Oral Oral Oral Oral  SpO2: 97% 99% 99% 98%  Weight:      Height:        Intake/Output Summary (Last 24 hours) at 09/26/2019 1033 Last data filed at 09/25/2019 2200 Gross per 24 hour  Intake 120 ml  Output 600 ml  Net -480 ml   Filed Weights   09/17/19 0200  Weight: 77 kg    Examination:  General exam: Appears calm and comfortable, chronically sick looking.  Anxious and in pain when movement.   Respiratory system: Clear to auscultation. Respiratory effort normal. Cardiovascular system: S1 & S2 heard, RRR. No JVD, murmurs, rubs, gallops or clicks. No pedal edema. Gastrointestinal system: Abdomen is nondistended, soft and nontender. No organomegaly or masses felt. Normal bowel sounds heard. Central nervous system: Alert and oriented. Left lower facial weakness.  Right gaze preference.  Left hemiplegia.  Normal on the right side. No localized tenderness.  Data Reviewed: I have personally reviewed following labs and imaging studies  CBC: Recent Labs  Lab 09/20/19 0534 09/21/19 0405 09/22/19 0338  WBC 8.5 7.4 7.4  HGB 10.1* 9.6* 9.9*  HCT 30.9* 30.1* 30.1*  MCV 83.1 84.3 83.4  PLT 263 279 253   Basic Metabolic Panel: Recent Labs  Lab 09/20/19 0534 09/21/19 0405 09/22/19 0338  NA 138 137 137  K 3.6 3.5 3.8  CL 109 108 107  CO2 19* 21* 23  GLUCOSE 141* 136* 128*  BUN CREATININE 0.69 0.78 0.84  CALCIUM 8.7* 8.8* 8.9  MG 1.8 1.9 2.0   GFR: Estimated Creatinine Clearance: 53.3 mL/min (by C-G formula based on SCr of 0.84 mg/dL). Liver Function Tests: Recent Labs  Lab 09/20/19 0534 09/21/19 0405 09/22/19 0338  AST ALT ALKPHOS 79 75 87  BILITOT 0.6 0.3 0.5  PROT 5.6* 5.5* 5.4*  ALBUMIN 2.7* 2.5* 2.7*   No results  for input(s): LIPASE, AMYLASE in the last 168 hours. No results for input(s): AMMONIA in the last 168 hours. Coagulation Profile: No results for input(s): INR, PROTIME in the last 168 hours. Cardiac Enzymes: No results for input(s): CKTOTAL, CKMB, CKMBINDEX, TROPONINI in the last 168 hours. BNP (last 3 results) No results for input(s): PROBNP in the last 8760 hours. HbA1C: No results for input(s): HGBA1C in the last 72 hours. CBG: Recent Labs  Lab 09/24/19 2013 09/25/19 0620 09/25/19 1219 09/25/19 1631 09/25/19 2046  GLUCAP 231* 153* 235* 181* 168*   Lipid Profile: No results for input(s): CHOL, HDL, LDLCALC, TRIG, CHOLHDL, LDLDIRECT in the last 72 hours. Thyroid Function Tests: No results for input(s): TSH, T4TOTAL, FREET4, T3FREE, THYROIDAB in the last 72 hours. Anemia Panel: No results for input(s): VITAMINB12, FOLATE, FERRITIN, TIBC, IRON, RETICCTPCT in the last 72 hours. Sepsis Labs: No results for input(s): PROCALCITON, LATICACIDVEN in the last 168 hours.  Recent Results (from the past 240 hour(s))  Blood Culture (routine x 2)     Status: None   Collection Time: 09/16/19 10:22 PM   Specimen: BLOOD LEFT FOREARM  Result  Value Ref Range Status   Specimen Description BLOOD LEFT FOREARM  Final   Special Requests   Final    BOTTLES DRAWN AEROBIC AND ANAEROBIC Blood Culture results may not be optimal due to an excessive volume of blood received in culture bottles   Culture   Final    NO GROWTH 5 DAYS Performed at [facility], [address], [place], [place] [address]    Report Status 09/21/2019 FINAL  Final  Blood Culture (routine x 2)     Status: None   Collection Time: 09/16/19 10:22 PM   Specimen: BLOOD RIGHT HAND  Result Value Ref Range Status   Specimen Description BLOOD RIGHT HAND  Final   Special Requests   Final    BOTTLES DRAWN AEROBIC AND ANAEROBIC Blood Culture results may not be optimal due to an inadequate volume of blood received in culture bottles    Culture   Final    NO GROWTH 5 DAYS Performed at [facility], [address], [place], [place] [address]    Report Status 09/21/2019 FINAL  Final  SARS CORONAVIRUS 2 (TAT 6-24 HRS) Nasopharyngeal Nasopharyngeal Swab     Status: None   Collection Time: 09/16/19 11:56 PM   Specimen: Nasopharyngeal Swab  Result Value Ref Range Status   SARS Coronavirus 2 NEGATIVE NEGATIVE Final    Comment: (NOTE) SARS-CoV-2 target nucleic acids are NOT DETECTED. The SARS-CoV-2 RNA is generally detectable in upper and lower respiratory specimens during the acute phase of infection. Negative results do not preclude SARS-CoV-2 infection, do not rule out co-infections with other pathogens, and should not be used as the sole basis for treatment or other patient management decisions. Negative results must be combined with clinical observations, patient history, and epidemiological information. The expected result is Negative. Fact Sheet for Patients: HairSlick.no Fact Sheet for Healthcare Providers: quierodirigir.com This test is not yet approved or cleared by the [place] FDA and  has been authorized for detection and/or diagnosis of SARS-CoV-2 by FDA under an Emergency Use Authorization (EUA). This EUA will remain  in effect (meaning this test can be used) for the duration of the COVID-19 declaration under Section 56 4(b)(1) of the Act, 21 U.S.C. section 360bbb-3(b)(1), unless the authorization is terminated or revoked sooner. Performed at [facility], [address] [place], [place] [address]   Urine culture     Status: Abnormal   Collection Time: 09/17/19  1:08 AM   Specimen: Urine, Random  Result Value Ref Range Status   Specimen Description URINE, RANDOM  Final   Special Requests NONE  Final   Culture (A)  Final    <10,000 COLONIES/mL INSIGNIFICANT GROWTH Performed at [facility], [address],  [place], [place] [address]    Report Status 09/18/2019 FINAL  Final  MRSA PCR Screening     Status: None   Collection Time: 09/17/19  9:57 PM   Specimen: Nasal Mucosa; Nasopharyngeal  Result Value Ref Range Status   MRSA by PCR NEGATIVE NEGATIVE Final    Comment:        The GeneXpert MRSA Assay (FDA approved for NASAL specimens only), is one component of a comprehensive MRSA colonization surveillance program. It is not intended to diagnose MRSA infection nor to guide or monitor treatment for MRSA infections. Performed at [facility], [address] [place], [place] [address]   SARS CORONAVIRUS 2 (TAT 6-24 HRS) Nasopharyngeal Nasopharyngeal Swab  Status: None   Collection Time: 09/20/19  5:15 PM   Specimen: Nasopharyngeal Swab  Result Value Ref Range Status   SARS Coronavirus 2 NEGATIVE NEGATIVE Final    Comment: (NOTE) SARS-CoV-2 target nucleic acids are NOT DETECTED. The SARS-CoV-2 RNA is generally detectable in upper and lower respiratory specimens during the acute phase of infection. Negative results do not preclude SARS-CoV-2 infection, do not rule out co-infections with other pathogens, and should not be used as the sole basis for treatment or other patient management decisions. Negative results must be combined with clinical observations, patient history, and epidemiological information. The expected result is Negative. Fact Sheet for Patients: SugarRoll.be Fact Sheet for Healthcare Providers: https://www.woods-mathews.com/ This test is not yet approved or cleared by the [place] FDA and  has been authorized for detection and/or diagnosis of SARS-CoV-2 by FDA under an Emergency Use Authorization (EUA). This EUA will remain  in effect (meaning this test can be used) for the duration of the COVID-19 declaration under Section 56 4(b)(1) of the Act, 21 U.S.C. section 360bbb-3(b)(1), unless the authorization is terminated  or revoked sooner. Performed at [facility], [place] [address] [place], [place] [address]   SARS CORONAVIRUS 2 (TAT 6-24 HRS) Nasopharyngeal Nasopharyngeal Swab     Status: None   Collection Time: 09/23/19 11:50 AM   Specimen: Nasopharyngeal Swab  Result Value Ref Range Status   SARS Coronavirus 2 NEGATIVE NEGATIVE Final    Comment: (NOTE) SARS-CoV-2 target nucleic acids are NOT DETECTED. The SARS-CoV-2 RNA is generally detectable in upper and lower respiratory specimens during the acute phase of infection. Negative results do not preclude SARS-CoV-2 infection, do not rule out co-infections with other pathogens, and should not be used as the sole basis for treatment or other patient management decisions. Negative results must be combined with clinical observations, patient history, and epidemiological information. The expected result is Negative. Fact Sheet for Patients: SugarRoll.be Fact Sheet for Healthcare Providers: https://www.woods-mathews.com/ This test is not yet approved or cleared by the [place] FDA and  has been authorized for detection and/or diagnosis of SARS-CoV-2 by FDA under an Emergency Use Authorization (EUA). This EUA will remain  in effect (meaning this test can be used) for the duration of the COVID-19 declaration under Section 56 4(b)(1) of the Act, 21 U.S.C. section 360bbb-3(b)(1), unless the authorization is terminated or revoked sooner. Performed at [facility], [place] [address] [place], [place] [address]          Radiology Studies: Ct Cervical Spine Wo Contrast  Result Date: 09/24/2019 CLINICAL DATA:  Central pain syndrome. Previous fall/trauma. Severe radicular pain. EXAM: CT CERVICAL SPINE WITHOUT CONTRAST TECHNIQUE: Multidetector CT imaging of the cervical spine was performed without intravenous contrast. Multiplanar CT image reconstructions were also generated. COMPARISON:  07/24/2019  FINDINGS: Alignment: Straightening of the normal cervical lordosis. No subluxation. Skull base and vertebrae: Vertebral body heights are maintained. There is mild spondylosis throughout the cervical spine. There is uncovertebral joint spurring and facet arthropathy. Non fusion of the midline posterior arch of C1. Moderate bilateral neural foraminal narrowing from the C4-5 level to the C6-7 level. No evidence of acute fracture or subluxation. Soft tissues and spinal canal: No prevertebral fluid or swelling. No visible canal hematoma. Disc levels: Moderate disc space narrowing from the C4-5 level to the C6-7 level. Upper chest: No acute findings. Other: None. IMPRESSION: 1.  No acute findings. 2. Mild spondylosis throughout the cervical spine. Disc disease from the C4-5 level  to the C6-7 level. Bilateral neural foraminal narrowing from the C4-5 to the C6-7 level. Electronically Signed   By: Elberta Fortis M.D.   On: 09/24/2019 15:29        Scheduled Meds:  apixaban  5 mg Oral BID   aspirin EC  81 mg Oral Daily   atorvastatin  40 mg Oral q1800   gabapentin  100 mg Oral TID   insulin aspart  0-15 Units Subcutaneous TID WC   insulin glargine  20 Units Subcutaneous QHS   levothyroxine  50 mcg Oral QAC breakfast   lidocaine  1 patch Transdermal Q24H   magnesium oxide  800 mg Oral BID   mouth rinse  15 mL Mouth Rinse BID   metoprolol tartrate  25 mg Oral BID   polyethylene glycol  17 g Oral BID   polyvinyl alcohol  1 drop Both Eyes QID   potassium chloride  10 mEq Oral Daily   venlafaxine XR  150 mg Oral Q breakfast   Continuous Infusions:    LOS: 9 days    Time spent: 25 minutes    Dorcas Carrow, MD [facility] Pager [phone]

## 2019-09-26 NOTE — TOC Progression Note (Signed)
Transition of Care [facility]) - Progression Note    Patient Details  Name: [name] MRN: [id number] Date of Birth: 29-Apr-1941  Transition of Care [facility]) CM/SW [place], [place] Phone Number: 09/26/2019, 2:37 PM  Clinical Narrative:     CSW faxed the patient to [facility] in [place].   CSW will continue to follow and assist with disposition planning.   Expected Discharge Plan: Skilled Nursing Facility Barriers to Discharge: Family Issues, Other (comment)(Unable to communicate with daughter)  Expected Discharge Plan and Services Expected Discharge Plan: [place] In-house Referral: Clinical Social Work   Post Acute Care Choice: [place] Living arrangements for the past 2 months: [place], Single Family Home                                       Social Determinants of Health (SDOH) Interventions    Readmission Risk Interventions No flowsheet data found.

## 2019-09-26 NOTE — Progress Notes (Signed)
Occupational Therapy Treatment Patient Details Name: [name] MRN: [id number] DOB: 1941-02-24 Today's Date: 09/26/2019    History of present illness [name] is a 78 y.o. 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 Of note, pt with R shoulder pain from recent fall. MRI revealed R frontoparietal CVA L arm pain from stable distal radial and ulnar fxs.    OT comments  Pt. Seen for skilled OT treatment session. Focus of session HEP  UEs and continued efforts on vision/gaze preference.  Stood on L side for encouragement for pt. To turn to L and attend to L side. Pt. Able to turn head but requires cues to maintain focus to L.  Unable to tolerate PROM to L digits and elbow without demonstrating pain and discomfort. Edema noted, assisted with positioning on pillow for edema management. Able to tolerate rom R  Digits, wrist, elbow.    Follow Up Recommendations       Equipment Recommendations       Recommendations for Other Services      Precautions / Restrictions Precautions Precautions: Fall Precaution Comments: Per chart, multiple falls at home.  Required Braces or Orthoses: Splint/Cast Splint/Cast: L wrist splint on upon arrival to pts. room Restrictions Other Position/Activity Restrictions: Pt with stable LUE ulnar and radial fx       Mobility Bed Mobility                  Transfers                      Balance                                           ADL either performed or assessed with clinical judgement   ADL                                               Vision   Additional Comments: stood on L side of bed for communication and inst.  pt. able to turn head/neck to left but required max cues to keep head L and would revert back to center or R.   Perception     Praxis      Cognition   Behavior During Therapy: Anxious Overall Cognitive Status: No family/caregiver present to determine baseline cognitive functioning                                          Exercises General Exercises - Upper Extremity Elbow Flexion: AROM;Right;5 reps;Supine Elbow Extension: AROM;Right;5 reps;Supine Wrist Flexion: AROM;Right;5 reps;Supine Wrist Extension: AROM;Right;5 reps;Supine Digit Composite Flexion: PROM;Left(unable to tolerate full flex/extension) Composite Extension: Left;PROM(unable to tolerate full flexion/extension L digits) Other Exercises Other Exercises: attempted finger PROM and movement of elbow on L UE but pt screamed and would not allow.  noted edema in fingers and encouraged pt to look at her fingers and think about  moving them Other Exercises: pt. tolerated rom to r ue digits,elbow   Shoulder Instructions       General Comments      Pertinent Vitals/ Pain       Pain Assessment: Faces Faces Pain Scale: Hurts even more Pain Location: LUE Pain Descriptors / Indicators: Discomfort;Grimacing;Moaning  Home Living                                          Prior Functioning/Environment              Frequency           Progress Toward Goals  OT Goals(current goals can now be found in the care plan section)  Progress towards OT goals: Progressing toward goals     Plan      Co-evaluation                 AM-PAC OT "6 Clicks" Daily Activity     Outcome Measure                    End of Session        Activity Tolerance     Patient Left     Nurse Communication          Time: 2706-2376 OT Time Calculation (min): 13 min  Charges: OT General Charges $OT Visit: 1 Visit OT Treatments $Therapeutic Exercise: 8-22 mins  Dinna Coffin, COTA/L 09/26/2019, 11:25 AM

## 2019-09-27 LAB — GLUCOSE, CAPILLARY
Glucose-Capillary: 167 mg/dL — ABNORMAL HIGH (ref 70–99)
Glucose-Capillary: 143 mg/dL — ABNORMAL HIGH (ref 70–99)
Glucose-Capillary: 155 mg/dL — ABNORMAL HIGH (ref 70–99)
Glucose-Capillary: 328 mg/dL — ABNORMAL HIGH (ref 70–99)
Glucose-Capillary: 340 mg/dL — ABNORMAL HIGH (ref 70–99)
Glucose-Capillary: 304 mg/dL — ABNORMAL HIGH (ref 70–99)
Glucose-Capillary: 154 mg/dL — ABNORMAL HIGH (ref 70–99)

## 2019-09-27 LAB — SARS CORONAVIRUS 2 (TAT 6-24 HRS): SARS Coronavirus 2: NEGATIVE

## 2019-09-27 MED ORDER — INSULIN ASPART 100 UNIT/ML ~~LOC~~ SOLN
10.0000 [IU] | Freq: Three times a day (TID) | SUBCUTANEOUS | Status: DC
  Administered 2019-09-27: 18:00:00 10 [IU] via SUBCUTANEOUS

## 2019-09-27 NOTE — Progress Notes (Signed)
Physical Therapy Treatment Patient Details Name: [name] MRN: [id number] DOB: 11/22/1941 Today's Date: 09/27/2019    History of Present Illness [name] is a 77 y.o. 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 Of note, pt with R shoulder pain from recent fall. MRI revealed R frontoparietal CVA L arm pain from stable distal radial and ulnar fxs.     PT Comments    Patient received in bed, A&Ox2, and reporting significant amounts of pain but willing to participate in PT today. Performed partial transfer of supine to sit with MaxAx2, patient stopped midway through and started yelling "No! No! NO!", was returned to supine with MaxAx2 and repositioned to comfort. Attempted multiple distraction techniques throughout session however patient difficult to redirect today- very internally distracted by pain. She was left in bed with all needs met, bed alarm active this afternoon.    Follow Up Recommendations  SNF;Supervision/Assistance - 24 hour     Equipment Recommendations  None recommended by PT    Recommendations for Other Services       Precautions / Restrictions Precautions Precautions: Fall Precaution Comments: Per chart, multiple falls at home.  Required Braces or Orthoses: Splint/Cast Splint/Cast: L wrist splint on upon arrival to pts. room Restrictions Weight Bearing Restrictions: No Other Position/Activity Restrictions: Pt with stable LUE ulnar and radial fx    Mobility  Bed Mobility Overal bed mobility: Needs Assistance Bed Mobility: Supine to Sit;Sit to Supine     Supine to sit: Max assist;+2 for physical assistance Sit to supine: Max assist;+2 for physical assistance   General bed  mobility comments: performed part of transfer to EOB with MaxAx2, patient then became very anxious and began to yell "No! No! No!", returned to bed with [place]  Transfers                 General transfer comment: refused  Ambulation/Gait             General Gait Details: refused   Stairs             Wheelchair Mobility    Modified Rankin (Stroke Patients Only)       Balance Overall balance assessment: Needs assistance Sitting-balance support: Feet unsupported Sitting balance-Leahy Scale: Fair Sitting balance - Comments: min guard for sitting balance                                    Cognition Arousal/Alertness: Awake/alert Behavior During Therapy: Anxious Overall Cognitive Status: No family/caregiver present to determine baseline cognitive functioning                                 General Comments: A&Ox2 today, very anxious and internally distracted by pain      Exercises      General Comments        Pertinent Vitals/Pain Pain Assessment: Faces Faces Pain Scale: Hurts even more Pain Location: generalized pain Pain Descriptors / Indicators: Discomfort;Grimacing;Moaning Pain Intervention(s): Limited activity within patient's tolerance;Monitored during session;Other (comment)(distraction)    Home Living  Prior Function            PT Goals (current goals can now be found in the care plan section) Acute Rehab PT Goals Patient Stated Goal: decrease pain PT Goal Formulation: With patient Time For Goal Achievement: 10/02/19 Potential to Achieve Goals: Fair Progress towards PT goals: Not progressing toward goals - comment(pain limited)    Frequency    Min 3X/week      PT Plan Current plan remains appropriate    Co-evaluation              AM-PAC PT "6 Clicks" Mobility   Outcome Measure  Help needed turning from your back to your side while in a flat bed without  using bedrails?: A Lot Help needed moving from lying on your back to sitting on the side of a flat bed without using bedrails?: A Lot Help needed moving to and from a bed to a chair (including a wheelchair)?: Total Help needed standing up from a chair using your arms (e.g., wheelchair or bedside chair)?: Total Help needed to walk in hospital room?: Total Help needed climbing 3-5 steps with a railing? : Total 6 Click Score: 8    End of Session   Activity Tolerance: Patient limited by pain Patient left: in bed;with call bell/phone within reach;with bed alarm set   PT Visit Diagnosis: History of falling (Z91.81);Muscle weakness (generalized) (M62.81);Repeated falls (R29.6);Unsteadiness on feet (R26.81);Pain Pain - Right/Left: Right Pain - part of body: (neck)     Time: 7867-5449 PT Time Calculation (min) (ACUTE ONLY): 12 min  Charges:  $Therapeutic Activity: 8-22 mins                     Windell Norfolk, DPT, CBIS  Supplemental Physical Therapist [place]    Pager [phone] Acute Rehab Office [phone]

## 2019-09-27 NOTE — Progress Notes (Signed)
Inpatient Diabetes Program Recommendations  AACE/ADA: New Consensus Statement on Inpatient Glycemic Control (2015)  Target Ranges:  Prepandial:   less than 140 mg/dL      Peak postprandial:   less than 180 mg/dL (1-2 hours)      Critically ill patients:  140 - 180 mg/dL   Results for [name], [name] (MRN [id number]) as of 09/27/2019 15:35  Ref. Range 09/26/2019 07:30 09/26/2019 12:30 09/26/2019 16:26  Glucose-Capillary Latest Ref Range: 70 - 99 mg/dL 143 (H)  3 units NOVOLOG  218 (H)  5 units NOVOLOG  219 (H)  5 units NOVOLOG +  20 units LANTUS   Results for [name], [name] (MRN [id number]) as of 09/27/2019 15:35  Ref. Range 09/27/2019 05:56 09/27/2019 13:01  Glucose-Capillary Latest Ref Range: 70 - 99 mg/dL 155 (H)  3 units NOVOLOG  340 (H)  11 units NOVOLOG     Home DM Meds: Lantus 30 units QHS   Current Orders: Lantus 20 units Daily      Novolog Moderate Correction Scale/ SSI (0-15 units) TID AC      MD- Note pt having elevated CBGs after meals.  Please consider adding Novolog Meal Coverage:  Novolog 6 units TID with meals  (Please add the following Hold Parameters: Hold if pt eats <50% of meal, Hold if pt NPO)     --Will follow patient during hospitalization--  Wyn Quaker RN, MSN, CDE Diabetes Coordinator Inpatient Glycemic Control Team Team Pager: [phone] (8a-5p)

## 2019-09-27 NOTE — Progress Notes (Signed)
PROGRESS NOTE    [name]  PTW:[id number] DOB: [birth year] DOA: 09/16/2019 PCP: Horald Pollen, MD    Brief Narrative:  78 year old female with history of coronary artery disease status post stent, A. fib on Eliquis, history of CVA with residual aphasia and left sided weakness, chronic diastolic heart failure, hypertension, type 2 diabetes, hypothyroidism who presented from a skilled nursing facility at the request of family member. Patient's daughter noticed that about a week ago she seemed to have left-sided neglect and some slurred speech. Facility reported no obvious changes from baseline.  In the emergency room she was afebrile, hypotensive with blood pressure 80/60 that improved with fluid resuscitation.  CBC showed no leukocytosis or anemia.  Slightly elevated creatinine.  Urinalysis showed large leukocyte, negative nitrite and many WBC.  She was admitted to the hospital secondary to stroke thought to be due to hypotension from possible UTI.  Improved with antibiotics and IV fluid and supportive care.  Seen by neurology who recommended continuing aspirin and Eliquis. Patient does have chronic pain syndrome, however continues to have neck pain, left upper extremity pain.  Assessment & Plan:   Active Problems:   Diabetes mellitus (HCC)   Chronic diastolic (congestive) heart failure (HCC)   Atrial fibrillation, chronic (HCC)   Hypotension   Cerebral thrombosis with cerebral infarction  Right frontoparietal lobe infarct: Suspect right frontal stroke secondary to progressive right ICA stenosis in the setting of urosepsis and hypotension.  Unable to get MRI due to foreign metal object in the past. CTA of the head and neck showed moderate diffuse narrowing of mid distal cervical right ICA, severe and extensive atherosclerotic disease. Repeat CT scan with interval change in size and appearance of evolving subacute right MCA territory infarct. Carotid ultrasound with less than 39%  stenosis bilaterally. Echocardiogram normal EF.  No PFO. A1c 9.6, LDL 41. Neurology recommendations to continue aspirin and Eliquis.  Avoid dropping blood pressures. Wrist splint on left wrist for pain.  Sepsis secondary to urinary tract infection present on admission: Blood cultures no growth.  Urine cultures with insignificant growth that was collected after antibiotics.  Chest x-ray with no evidence of infection.  Initially treated with broad-spectrum antibiotics and narrowed to ceftriaxone.   She finished 7 days of therapy.    Acute kidney injury: Treated with IV fluids and improvement.  Paroxysmal A. fib: Currently rate controlled in sinus rhythm.  On Eliquis and aspirin.  Type 2 diabetes: A1c 9.6.  Blood sugar is stable on current regimen.  She will continue similar dose of insulin and discussed dietary compliance.  Fall with left distal radius ulna fracture, neck pain and shoulder pain: Patient had fall at skilled rehab about 2 months ago, found to have left distal radius fracture.  Patient continues to have severe neck pain and splinting.  She is also on chronic pain management. Continue adequate pain medications, local lidocaine patches, muscle relaxants, increased dose of oxycodone. With persistent spasm and neck pain, a repeat CT scan of the cervical spine was done and is stable. Patient continues to require pain medications.  Will decrease oxycodone to 5 mg every 4 hours, add Flexeril and gabapentin.  DVT prophylaxis: Eliquis Code Status: Full code Family Communication: None , Disposition Plan: Skilled nursing facility, pending availability.  Local a skilled nursing facility to admit her tomorrow.  COVID-19 test in anticipation of discharge.  Consultants:   Neurology  Procedures:   None  Antimicrobials:   Keflex, D7/7   Subjective: No overnight  events.  No new issues.  She thinks heating pad and lidocaine did help her neck.  Objective: Vitals:   09/26/19 2018  09/26/19 2349 09/27/19 0401 09/27/19 0823  BP: 111/86 103/73 104/60 105/82  Pulse: (!) 102 70 90 86  Resp: Temp: 98.1 F (36.7 C) 98 F (36.7 C) 98 F (36.7 C) 98.2 F (36.8 C)  TempSrc: Oral Oral Oral Oral  SpO2: 98% 100% 97% 96%  Weight:      Height:       No intake or output data in the 24 hours ending 09/27/19 1435 Filed Weights   09/17/19 0200  Weight: 77 kg    Examination:  General exam: Appears calm and comfortable, chronically sick looking.  Anxious and in pain when movement.   Respiratory system: Clear to auscultation. Respiratory effort normal. Cardiovascular system: S1 & S2 heard, RRR. No JVD, murmurs, rubs, gallops or clicks. No pedal edema. Gastrointestinal system: Abdomen is nondistended, soft and nontender. No organomegaly or masses felt. Normal bowel sounds heard. Central nervous system: Alert and oriented. Left lower facial weakness.  Right gaze preference.  Left hemiplegia.  Normal on the right side. No localized tenderness.  Data Reviewed: I have personally reviewed following labs and imaging studies  CBC: Recent Labs  Lab 09/21/19 0405 09/22/19 0338  WBC 7.4 7.4  HGB 9.6* 9.9*  HCT 30.1* 30.1*  MCV 84.3 83.4  PLT 279 253   Basic Metabolic Panel: Recent Labs  Lab 09/21/19 0405 09/22/19 0338  NA 137 137  K 3.5 3.8  CL 108 107  CO2 21* 23  GLUCOSE 136* 128*  BUN 11 10  CREATININE 0.78 0.84  CALCIUM 8.8* 8.9  MG 1.9 2.0   GFR: Estimated Creatinine Clearance: 53.3 mL/min (by C-G formula based on SCr of 0.84 mg/dL). Liver Function Tests: Recent Labs  Lab 09/21/19 0405 09/22/19 0338  AST 22 23  ALT 21 19  ALKPHOS 75 87  BILITOT 0.3 0.5  PROT 5.5* 5.4*  ALBUMIN 2.5* 2.7*   No results for input(s): LIPASE, AMYLASE in the last 168 hours. No results for input(s): AMMONIA in the last 168 hours. Coagulation Profile: No results for input(s): INR, PROTIME in the last 168 hours. Cardiac Enzymes: No results for input(s):  CKTOTAL, CKMB, CKMBINDEX, TROPONINI in the last 168 hours. BNP (last 3 results) No results for input(s): PROBNP in the last 8760 hours. HbA1C: No results for input(s): HGBA1C in the last 72 hours. CBG: Recent Labs  Lab 09/26/19 1230 09/26/19 1626 09/27/19 0556 09/27/19 1301 09/27/19 1309  GLUCAP 218* 219* 155* 340* 328*   Lipid Profile: No results for input(s): CHOL, HDL, LDLCALC, TRIG, CHOLHDL, LDLDIRECT in the last 72 hours. Thyroid Function Tests: No results for input(s): TSH, T4TOTAL, FREET4, T3FREE, THYROIDAB in the last 72 hours. Anemia Panel: No results for input(s): VITAMINB12, FOLATE, FERRITIN, TIBC, IRON, RETICCTPCT in the last 72 hours. Sepsis Labs: No results for input(s): PROCALCITON, LATICACIDVEN in the last 168 hours.  Recent Results (from the past 240 hour(s))  MRSA PCR Screening     Status: None   Collection Time: 09/17/19  9:57 PM   Specimen: Nasal Mucosa; Nasopharyngeal  Result Value Ref Range Status   MRSA by PCR NEGATIVE NEGATIVE Final    Comment:        The GeneXpert MRSA Assay (FDA approved for NASAL specimens only), is one component of a comprehensive MRSA colonization surveillance program. It is not intended to diagnose MRSA infection nor  to guide or monitor treatment for MRSA infections. Performed at [facility], [address], [place], [place] [address]   SARS CORONAVIRUS 2 (TAT 6-24 HRS) Nasopharyngeal Nasopharyngeal Swab     Status: None   Collection Time: 09/20/19  5:15 PM   Specimen: Nasopharyngeal Swab  Result Value Ref Range Status   SARS Coronavirus 2 NEGATIVE NEGATIVE Final    Comment: (NOTE) SARS-CoV-2 target nucleic acids are NOT DETECTED. The SARS-CoV-2 RNA is generally detectable in upper and lower respiratory specimens during the acute phase of infection. Negative results do not preclude SARS-CoV-2 infection, do not rule out co-infections with other pathogens, and should not be used as the sole basis for treatment or  other patient management decisions. Negative results must be combined with clinical observations, patient history, and epidemiological information. The expected result is Negative. Fact Sheet for Patients: HairSlick.no Fact Sheet for Healthcare Providers: quierodirigir.com This test is not yet approved or cleared by the [place] FDA and  has been authorized for detection and/or diagnosis of SARS-CoV-2 by FDA under an Emergency Use Authorization (EUA). This EUA will remain  in effect (meaning this test can be used) for the duration of the COVID-19 declaration under Section 56 4(b)(1) of the Act, 21 U.S.C. section 360bbb-3(b)(1), unless the authorization is terminated or revoked sooner. Performed at [facility], [address], [place], [place] [address]   SARS CORONAVIRUS 2 (TAT 6-24 HRS) Nasopharyngeal Nasopharyngeal Swab     Status: None   Collection Time: 09/23/19 11:50 AM   Specimen: Nasopharyngeal Swab  Result Value Ref Range Status   SARS Coronavirus 2 NEGATIVE NEGATIVE Final    Comment: (NOTE) SARS-CoV-2 target nucleic acids are NOT DETECTED. The SARS-CoV-2 RNA is generally detectable in upper and lower respiratory specimens during the acute phase of infection. Negative results do not preclude SARS-CoV-2 infection, do not rule out co-infections with other pathogens, and should not be used as the sole basis for treatment or other patient management decisions. Negative results must be combined with clinical observations, patient history, and epidemiological information. The expected result is Negative. Fact Sheet for Patients: HairSlick.no Fact Sheet for Healthcare Providers: quierodirigir.com This test is not yet approved or cleared by the [place] FDA and  has been authorized for detection and/or diagnosis of SARS-CoV-2 by FDA under an Emergency Use  Authorization (EUA). This EUA will remain  in effect (meaning this test can be used) for the duration of the COVID-19 declaration under Section 56 4(b)(1) of the Act, 21 U.S.C. section 360bbb-3(b)(1), unless the authorization is terminated or revoked sooner. Performed at [facility], [address] [place], [place] [address]          Radiology Studies: No results found.      Scheduled Meds: . apixaban  5 mg Oral BID  . aspirin EC  81 mg Oral Daily  . atorvastatin  40 mg Oral q1800  . gabapentin  100 mg Oral TID  . insulin aspart  0-15 Units Subcutaneous TID WC  . insulin glargine  20 Units Subcutaneous QHS  . levothyroxine  50 mcg Oral QAC breakfast  . lidocaine  1 patch Transdermal Q24H  . magnesium oxide  800 mg Oral BID  . mouth rinse  15 mL Mouth Rinse BID  . metoprolol tartrate  25 mg Oral BID  . polyethylene glycol  17 g Oral BID  . polyvinyl alcohol  1 drop Both Eyes QID  . potassium chloride  10  mEq Oral Daily  . venlafaxine XR  150 mg Oral Q breakfast   Continuous Infusions:    LOS: 10 days    Time spent: 20 minutes    Dorcas CarrowKuber Haylen Bellotti, MD [facility] Pager [phone]

## 2019-09-27 NOTE — TOC Progression Note (Signed)
Transition of Care [facility]) - Progression Note    Patient Details  Name: [name] MRN: [id number] Date of Birth: [birth year]  Transition of [facility]) CM/SW Contact  Pollie Friar, RN Phone Number: 09/27/2019, 11:50 AM  Clinical Narrative:    [facility] a bed for tomorrow. Pt will need repeat Covid and MD updated. Pts daughter and spouse are aware of plan.  TOC following.   Expected Discharge Plan: Skilled Nursing Facility Barriers to Discharge: Family Issues, Other (comment)(Unable to communicate with daughter)  Expected Discharge Plan and Services Expected Discharge Plan: [place] In-house Referral: Clinical Social Work   Post Acute Care Choice: [name] Living arrangements for the past 2 months: [place], Single Family Home                                       Social Determinants of Health (SDOH) Interventions    Readmission Risk Interventions No flowsheet data found.

## 2019-09-28 ENCOUNTER — Telehealth: Payer: Self-pay | Admitting: *Deleted

## 2019-09-28 DIAGNOSIS — R402411 Glasgow coma scale score 13-15, in the field [EMT or ambulance]: Secondary | ICD-10-CM | POA: Diagnosis not present

## 2019-09-28 DIAGNOSIS — M6281 Muscle weakness (generalized): Secondary | ICD-10-CM | POA: Diagnosis not present

## 2019-09-28 DIAGNOSIS — G8929 Other chronic pain: Secondary | ICD-10-CM | POA: Diagnosis not present

## 2019-09-28 DIAGNOSIS — I480 Paroxysmal atrial fibrillation: Secondary | ICD-10-CM | POA: Diagnosis not present

## 2019-09-28 DIAGNOSIS — S52502A Unspecified fracture of the lower end of left radius, initial encounter for closed fracture: Secondary | ICD-10-CM | POA: Diagnosis not present

## 2019-09-28 DIAGNOSIS — R52 Pain, unspecified: Secondary | ICD-10-CM | POA: Diagnosis not present

## 2019-09-28 DIAGNOSIS — Z794 Long term (current) use of insulin: Secondary | ICD-10-CM | POA: Diagnosis not present

## 2019-09-28 DIAGNOSIS — R1312 Dysphagia, oropharyngeal phase: Secondary | ICD-10-CM | POA: Diagnosis not present

## 2019-09-28 DIAGNOSIS — R279 Unspecified lack of coordination: Secondary | ICD-10-CM | POA: Diagnosis not present

## 2019-09-28 DIAGNOSIS — R278 Other lack of coordination: Secondary | ICD-10-CM | POA: Diagnosis not present

## 2019-09-28 DIAGNOSIS — I63239 Cerebral infarction due to unspecified occlusion or stenosis of unspecified carotid arteries: Secondary | ICD-10-CM | POA: Diagnosis not present

## 2019-09-28 DIAGNOSIS — I63511 Cerebral infarction due to unspecified occlusion or stenosis of right middle cerebral artery: Secondary | ICD-10-CM | POA: Diagnosis not present

## 2019-09-28 DIAGNOSIS — E1159 Type 2 diabetes mellitus with other circulatory complications: Secondary | ICD-10-CM

## 2019-09-28 DIAGNOSIS — I482 Chronic atrial fibrillation, unspecified: Secondary | ICD-10-CM | POA: Diagnosis not present

## 2019-09-28 DIAGNOSIS — D6489 Other specified anemias: Secondary | ICD-10-CM | POA: Diagnosis not present

## 2019-09-28 DIAGNOSIS — I2510 Atherosclerotic heart disease of native coronary artery without angina pectoris: Secondary | ICD-10-CM | POA: Diagnosis not present

## 2019-09-28 DIAGNOSIS — G8194 Hemiplegia, unspecified affecting left nondominant side: Secondary | ICD-10-CM | POA: Diagnosis not present

## 2019-09-28 DIAGNOSIS — N390 Urinary tract infection, site not specified: Secondary | ICD-10-CM | POA: Diagnosis not present

## 2019-09-28 DIAGNOSIS — I69054 Hemiplegia and hemiparesis following nontraumatic subarachnoid hemorrhage affecting left non-dominant side: Secondary | ICD-10-CM | POA: Diagnosis not present

## 2019-09-28 DIAGNOSIS — A419 Sepsis, unspecified organism: Secondary | ICD-10-CM | POA: Diagnosis not present

## 2019-09-28 DIAGNOSIS — I10 Essential (primary) hypertension: Secondary | ICD-10-CM | POA: Diagnosis not present

## 2019-09-28 DIAGNOSIS — I469 Cardiac arrest, cause unspecified: Secondary | ICD-10-CM | POA: Diagnosis not present

## 2019-09-28 DIAGNOSIS — R41841 Cognitive communication deficit: Secondary | ICD-10-CM | POA: Diagnosis not present

## 2019-09-28 DIAGNOSIS — I5032 Chronic diastolic (congestive) heart failure: Secondary | ICD-10-CM | POA: Diagnosis not present

## 2019-09-28 DIAGNOSIS — L0390 Cellulitis, unspecified: Secondary | ICD-10-CM | POA: Diagnosis not present

## 2019-09-28 DIAGNOSIS — Z905 Acquired absence of kidney: Secondary | ICD-10-CM | POA: Diagnosis not present

## 2019-09-28 DIAGNOSIS — R4701 Aphasia: Secondary | ICD-10-CM | POA: Diagnosis not present

## 2019-09-28 DIAGNOSIS — R4189 Other symptoms and signs involving cognitive functions and awareness: Secondary | ICD-10-CM | POA: Diagnosis not present

## 2019-09-28 DIAGNOSIS — E118 Type 2 diabetes mellitus with unspecified complications: Secondary | ICD-10-CM | POA: Diagnosis not present

## 2019-09-28 DIAGNOSIS — Z7189 Other specified counseling: Secondary | ICD-10-CM | POA: Diagnosis not present

## 2019-09-28 DIAGNOSIS — Z743 Need for continuous supervision: Secondary | ICD-10-CM | POA: Diagnosis not present

## 2019-09-28 DIAGNOSIS — E440 Moderate protein-calorie malnutrition: Secondary | ICD-10-CM | POA: Diagnosis not present

## 2019-09-28 DIAGNOSIS — R2689 Other abnormalities of gait and mobility: Secondary | ICD-10-CM | POA: Diagnosis not present

## 2019-09-28 LAB — GLUCOSE, CAPILLARY
Glucose-Capillary: 81 mg/dL (ref 70–99)
Glucose-Capillary: 211 mg/dL — ABNORMAL HIGH (ref 70–99)

## 2019-09-28 MED ORDER — OXYCODONE HCL 5 MG PO TABS: 5.0000 mg | ORAL_TABLET | ORAL | 0 refills | Status: AC | PRN

## 2019-09-28 MED ORDER — LIDOCAINE 5 % EX PTCH: 1.0000 | MEDICATED_PATCH | CUTANEOUS | 0 refills | Status: AC

## 2019-09-28 MED ORDER — LANTUS SOLOSTAR 100 UNIT/ML ~~LOC~~ SOPN: 20.0000 [IU] | PEN_INJECTOR | Freq: Every day | SUBCUTANEOUS | 11 refills | Status: AC

## 2019-09-28 MED ORDER — INSULIN ASPART 100 UNIT/ML ~~LOC~~ SOLN: 8.0000 [IU] | Freq: Three times a day (TID) | SUBCUTANEOUS | 11 refills | Status: DC

## 2019-09-28 MED ORDER — CYCLOBENZAPRINE HCL 5 MG PO TABS: 5.0000 mg | ORAL_TABLET | Freq: Three times a day (TID) | ORAL | 0 refills | Status: DC | PRN

## 2019-09-28 MED ORDER — GABAPENTIN 100 MG PO CAPS: 200.0000 mg | ORAL_CAPSULE | Freq: Three times a day (TID) | ORAL | Status: AC

## 2019-09-28 NOTE — Progress Notes (Signed)
[facility] here to pick up patient. Transport to [facility]. [place]

## 2019-09-28 NOTE — Telephone Encounter (Signed)
Faxed request for chart notes to support diagnosis for DM. The request is for [facility] 14 day [employer] [phone] & [phone]).

## 2019-09-28 NOTE — Discharge Summary (Signed)
Physician Discharge Summary  [name] WUJ:[id number] DOB: 1941-10-12 DOA: 09/16/2019  PCP: Horald Pollen, MD  Admit date: 09/16/2019 Discharge date: 09/28/2019  Admitted From: SNF Disposition:  SNF  Recommendations for Outpatient Follow-up:  Follow-up at a skilled nursing facility. Home Health: Not applicable Equipment/Devices: Not applicable  Discharge Condition: Stable CODE STATUS: Full code Diet recommendation: Low-carb diet  Discharge summary: 78 year old female with history of coronary artery disease status post stent, A. fib on Eliquis, history of CVA with residual aphasia and left sided weakness, chronic diastolic heart failure, hypertension, type 2 diabetes, hypothyroidism who presented from a skilled nursing facility at the request of family member. Patient's daughter noticed that about a week ago she seemed to have left-sided neglect and some slurred speech. Facility reported no obvious changes from baseline.  In the emergency room she was afebrile, hypotensive with blood pressure 80/60 that improved with fluid resuscitation.  CBC showed no leukocytosis or anemia.  Slightly elevated creatinine.  Urinalysis showed large leukocyte, negative nitrite and many WBC.  She was admitted to the hospital secondary to stroke thought to be due to hypotension from possible UTI.  Improved with antibiotics and IV fluid and supportive care.  Seen by neurology who recommended continuing aspirin and Eliquis. Patient does have chronic pain syndrome, however continues to have neck pain, left upper extremity pain.  Treated for following conditions with further plan of care as below:  Right frontoparietal lobe infarct: Suspect right frontal stroke secondary to progressive right ICA stenosis in the setting of urosepsis and hypotension.  Unable to get MRI due to foreign metal object in the past. CTA of the head and neck showed moderate diffuse narrowing of mid distal cervical right ICA, severe  and extensive atherosclerotic disease. Repeat CT scan with interval change in size and appearance of evolving subacute right MCA territory infarct. Carotid ultrasound with less than 39% stenosis bilaterally. Echocardiogram normal EF.  No PFO. A1c 9.6, LDL 41. Neurology recommendations to continue aspirin and Eliquis.  Avoid dropping blood pressures. Wrist splint on left wrist for pain. To avoid dropping blood pressures, lisinopril and hydrochlorothiazide discontinued.  She is on metoprolol for rate control.  Sepsis secondary to urinary tract infection present on admission: Resolved. Blood cultures no growth.  Urine cultures with insignificant growth that was collected after antibiotics.  Chest x-ray with no evidence of infection.  Initially treated with broad-spectrum antibiotics and narrowed to ceftriaxone. She finished 7 days of therapy.    Acute kidney injury: Treated with IV fluids and improvement.  Paroxysmal A. fib: Currently rate controlled in sinus rhythm.  On Eliquis and aspirin and metoprolol.  Type 2 diabetes: A1c 9.6.  Blood sugar is stable on current regimen.  Patient was only on Lantus and sliding scale.  Currently on Lantus 20 units and NovoLog 8 units with meals.  Can continue sliding scale coverage and uptitrate as needed at the skilled nursing facility.   Fall with left distal radius ulna fracture, neck pain and shoulder pain: Patient had previous fall and found to have left distal radius fracture.  Patient continues to have severe neck pain and splinting. She is also on chronic pain management. Has been using oral oxycodone, gabapentin Flexeril and lidocaine patch in the hospital. Continue oxycodone 5 mg as needed and gradually taper off within the next week. Will increase dose of gabapentin to 200 mg 3 times a day, can increase depends upon response. She will use Tylenol, muscle relaxants and local lidocaine patch.  Chronically  sick.  Currently stable enough to be  transferred to a skilled nursing facility.  Discharge Diagnoses:  Active Problems:   Diabetes mellitus (HCC)   Chronic diastolic (congestive) heart failure (HCC)   Atrial fibrillation, chronic (HCC)   Hypotension   Cerebral thrombosis with cerebral infarction    Discharge Instructions  Discharge Instructions    Diet Carb Modified   Complete by: As directed    Discharge instructions   Complete by: As directed    Gradually taper of the opiates and increased dose of gabapentin.   Increase activity slowly   Complete by: As directed      Allergies as of 09/28/2019      Reactions   Azithromycin Nausea And Vomiting   Cardizem [diltiazem Hcl] Nausea And Vomiting   Codeine Other (See Comments)   Made the patient "feel weird"   Coreg [carvedilol] Nausea And Vomiting   Demerol [meperidine] Other (See Comments)   Disorientation   Erythromycin Nausea And Vomiting, Other (See Comments)   Made the patient "very sick"   Inderal [propranolol] Other (See Comments)   "Made me cry"   Procardia [nifedipine] Other (See Comments)   "Disoriented and sick"   Wellbutrin [bupropion] Nausea Only   Zoloft [sertraline Hcl] Other (See Comments)   Could not talk, made the patient stiff      Medication List    STOP taking these medications   hydrochlorothiazide 25 MG tablet Commonly known as: HYDRODIURIL   HYDROcodone-acetaminophen 10-325 MG tablet Commonly known as: NORCO   lisinopril 20 MG tablet Commonly known as: ZESTRIL     TAKE these medications   Accu-Chek Softclix Lancets lancets USE TO TEST FOUR TIMES DAILY What changed:   how much to take  how to take this  when to take this  additional instructions   aspirin 81 MG EC tablet Take 1 tablet (81 mg total) by mouth daily.   atorvastatin 40 MG tablet Commonly known as: LIPITOR TAKE 1 TABLET BY MOUTH ONCE DAILY AT  6PM What changed: See the new instructions.   blood glucose meter kit and supplies Dispense based on  patient and insurance preference. Use up to four times daily as directed. (FOR ICD-9 250.00, 250.01). What changed:   how much to take  how to take this  when to take this  additional instructions   Chromium 200 MCG Tabs Take 200 mcg by mouth daily.   cyclobenzaprine 5 MG tablet Commonly known as: FLEXERIL Take 1 tablet (5 mg total) by mouth 3 (three) times daily as needed for muscle spasms.   Eliquis 5 MG Tabs tablet Generic drug: apixaban Take 1 tablet by mouth twice daily What changed: how much to take   fexofenadine 180 MG tablet Commonly known as: ALLEGRA Take 180 mg by mouth daily as needed for allergies.   gabapentin 100 MG capsule Commonly known as: NEURONTIN Take 2 capsules (200 mg total) by mouth 3 (three) times daily.   glucose blood test strip Commonly known as: Accu-Chek Aviva Plus USE TO TEST FOUR TIMES DAILY What changed:   how much to take  how to take this  when to take this  additional instructions   GNP CoQ-10 & Fish Oil 120-180-50-30 Caps Generic drug: DHA-EPA-Coenzyme Q10-Vitamin E Take 2 capsules by mouth daily.   [employer] Elite Misc 1 Units by Does not apply route daily as needed. What changed: how to take this   ibuprofen 200 MG tablet Commonly known as: ADVIL Take 400-600 mg  by mouth every 4 (four) hours as needed for moderate pain.   insulin aspart 100 UNIT/ML injection Commonly known as: novoLOG Inject 8 Units into the skin 3 (three) times daily with meals.   INSULIN SYRINGE .5CC/31GX5/16" 31G X 5/16" 0.5 ML Misc Use as directed What changed:   how much to take  how to take this  when to take this   Lantus SoloStar 100 UNIT/ML Solostar Pen Generic drug: Insulin Glargine Inject 20 Units into the skin at bedtime. What changed: how much to take   levothyroxine 50 MCG tablet Commonly known as: SYNTHROID TAKE 1 TABLET BY MOUTH ONCE DAILY BEFORE BREAKFAST What changed: See the new instructions.   lidocaine  5 % Commonly known as: LIDODERM Place 1 patch onto the skin daily. Remove & Discard patch within 12 hours or as directed by MD   magnesium oxide 400 MG tablet Commonly known as: MAG-OX Take 800 mg by mouth 2 (two) times daily.   metoprolol tartrate 50 MG tablet Commonly known as: LOPRESSOR Take 1 tablet by mouth twice daily   oxyCODONE 5 MG immediate release tablet Commonly known as: Oxy IR/ROXICODONE Take 1 tablet (5 mg total) by mouth every 4 (four) hours as needed for up to 5 days for moderate pain or severe pain.   potassium chloride 10 MEQ tablet Commonly known as: KLOR-CON TAKE 1  BY MOUTH ONCE DAILY What changed: See the new instructions.   venlafaxine XR 150 MG 24 hr capsule Commonly known as: Effexor XR Take 1 capsule (150 mg total) by mouth daily with breakfast.   Vitamin D3 125 MCG (5000 UT) Tbdp Take 5,000 Units by mouth daily.       Allergies  Allergen Reactions  . Azithromycin Nausea And Vomiting  . Cardizem [Diltiazem Hcl] Nausea And Vomiting  . Codeine Other (See Comments)    Made the patient "feel weird"   . Coreg [Carvedilol] Nausea And Vomiting  . Demerol [Meperidine] Other (See Comments)    Disorientation   . Erythromycin Nausea And Vomiting and Other (See Comments)    Made the patient "very sick"  . Inderal [Propranolol] Other (See Comments)    "Made me cry"  . Procardia [Nifedipine] Other (See Comments)    "Disoriented and sick"  . Wellbutrin [Bupropion] Nausea Only  . Zoloft [Sertraline Hcl] Other (See Comments)    Could not talk, made the patient stiff    Consultations:  Neurology   Procedures/Studies: Ct Angio Head W Or Wo Contrast  Result Date: 09/17/2019 CLINICAL DATA:  Follow-up examination for acute stroke, left-sided weakness. EXAM: CT ANGIOGRAPHY HEAD AND NECK TECHNIQUE: Multidetector CT imaging of the head and neck was performed using the standard protocol during bolus administration of intravenous contrast. Multiplanar CT  image reconstructions and MIPs were obtained to evaluate the vascular anatomy. Carotid stenosis measurements (when applicable) are obtained utilizing NASCET criteria, using the distal internal carotid diameter as the denominator. CONTRAST:  54m OMNIPAQUE IOHEXOL 350 MG/ML SOLN COMPARISON:  Prior CT from earlier same day as well as previous CTA from 05/19/2018. FINDINGS: CTA NECK FINDINGS Aortic arch: Visualized aortic arch of normal caliber with normal 3 vessel morphology. Moderate atherosclerotic change about the arch and origin of the great vessels without hemodynamically significant stenosis. Extensive atherosclerotic change seen throughout the visualized left subclavian artery without high-grade stenosis. There is a severe near occlusive stenosis involving the mid right subclavian artery, grossly stable from previous (series 7, image 278). Right carotid system: Right CCA patent from its  origin to the bifurcation without stenosis. Bulky calcified plaque about the right bifurcation with associated stenosis of up to 65% by NASCET criteria. Distally, the right ICA is somewhat irregular and attenuated, and diffusely narrowed as compared to the previous CTA, concerning for possible dissection (series 7, images 179-155). Finding is age indeterminate. No frank raised dissection flap visualized. Secondary moderate diffuse narrowing over the affected segment. Right ICA otherwise patent at the skull base. Left carotid system: Mild atheromatous stenosis at the origin of the left CCA. Left CCA otherwise widely patent to the bifurcation without significant stenosis. Bulky calcified plaque at the left bifurcation with associated stenosis of up to 65% by NASCET criteria. Superimposed small penetrating plaque noted at the proximal left ICA (series 7, image 213). Scattered multifocal centric plaque within the distal left ICA without significant stenosis. Left ICA otherwise patent to the skull base. Vertebral arteries: Both  vertebral arteries arise from the subclavian arteries. Right vertebral artery dominant with a diffusely hypoplastic left vertebral artery. Focal plaque at the origin of the right vertebral artery with secondary severe stenosis, grossly stable from previous. Right vertebral otherwise widely patent to the skull base. Hypoplastic left vertebral artery severely diseased, and essentially occludes at the skull base, relatively stable from previous. Skeleton: No acute osseous finding. No discrete osseous lesions. Moderate cervical spondylosis at C4-5 through C6-7. Other neck: No other acute soft tissue abnormality within the neck. Upper chest: Few mildly enlarged right paratracheal and pretracheal nodes measure up to 13 mm, indeterminate, but could be reactive. Small layering bilateral pleural effusions with associated atelectasis. Diffuse interlobular septal thickening compatible with pulmonary interstitial edema. Subcentimeter calcified granuloma noted at the central aspect of the left upper lobe, stable. Review of the MIP images confirms the above findings CTA HEAD FINDINGS Anterior circulation: Petrous left ICA patent. Extensive atheromatous plaque throughout the left carotid siphon with associated moderate diffuse narrowing. A more focal severe stenosis again noted at the supraclinoid left ICA, stable (series 7, image 114). Left ICA terminus perfused. Left M1 irregular but patent without high-grade stenosis. Negative left MCA bifurcation. Extensive small vessel atheromatous irregularity throughout the left MCA branches without large vessel occlusion. Previously seen left M3 occlusion has recanalized. Petrous right ICA patent. Extensive plaque throughout the right carotid siphon with moderate to severe multifocal narrowing, similar to previous. Right ICA terminus perfused. Right M1 irregular but patent and bifurcates early. Extensive atheromatous irregularity throughout the right MCA branches without proximal  occlusion. A1 segments irregular but patent bilaterally. Right A1 hypoplastic. Normal anterior communicating artery. Extensive atheromatous change throughout the left A2 segment with up to moderate multifocal stenoses. Right A2 severely disease and attenuated with multifocal severe stenoses, although does remain patent to its distal aspect. Appearance is stable from previous. 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 Moderate atherosclerotic change throughout the dominant right V4 segment with associated moderate to severe multifocal stenoses, stable. Right PICA patent proximally. Left PICA not visualized. Basilar diminutive with diffuse atheromatous irregularity but remains patent to its distal aspect, unchanged. Superior cerebral arteries patent bilaterally. Both of the posterior cerebral arteries primarily supplied via the basilar. PCAs demonstrate extensive atheromatous irregularity but are patent to their distal aspects. Venous sinuses: Grossly patent allowing for timing of the contrast bolus. Anatomic variants: None significant. Review of the MIP images confirms the above findings IMPRESSION: 1. Negative CTA for large vessel occlusion. 2. Moderate diffuse narrowing of the mid-distal cervical right ICA, new/progressed relative to previous CTA from  2019, and suspicious for possible interval dissection, age indeterminate. No raised dissection flap or significant luminal irregularity. 3. 65% atheromatous stenoses about the carotid bifurcations bilaterally. 4. Severe and extensive atherosclerotic change throughout the intracranial circulation, relatively unchanged from previous. Again, most notable findings include multifocal severe right A2 and V4 stenoses. 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 5. Short-segment severe proximal right V1 stenosis. 6. Severe near occlusive stenosis at the mid right subclavian artery. 7. Small layering  bilateral pleural effusions with associated pulmonary interstitial edema. Electronically Signed   By: Jeannine Boga M.D.   On: 09/17/2019 04:40   Dg Forearm Left  Result Date: 09/17/2019 CLINICAL DATA:  History of prior distal radial fracture, follow-up exam EXAM: LEFT FOREARM - 2 VIEW COMPARISON:  06/29/2019 FINDINGS: Prior distal radial and ulnar fractures are again identified and stable. Intra-articular involvement is noted. No significant callus formation is noted. IMPRESSION: Stable appearance of distal radial and ulnar fractures. Electronically Signed   By: Inez Catalina M.D.   On: 09/17/2019 12:22   Ct Head Wo Contrast  Result Date: 09/18/2019 CLINICAL DATA:  Follow-up examination for recent stroke. EXAM: CT HEAD WITHOUT CONTRAST TECHNIQUE: Contiguous axial images were obtained from the base of the skull through the vertex without intravenous contrast. COMPARISON:  Prior CT from 09/17/2019. FINDINGS: Brain: Examination degraded by motion artifact. Evolving subacute right MCA territory infarct involving the right frontal lobe is relatively stable in size and distribution from previous. Few scattered subtle areas of hyperdensity within the area of infarction likely reflect petechial hemorrhage and/or evolving laminar necrosis, stable. No frank hemorrhagic transformation or significant mass effect. No other new acute large vessel territory infarct. Chronic posterior left MCA territory infarction again noted. Underlying atrophy with chronic small vessel ischemic disease. No acute intracranial hemorrhage. No mass lesion, mass effect, or midline shift. No hydrocephalus. No extra-axial fluid collection. Vascular: No hyperdense vessel. Calcified atherosclerosis at the skull base. Skull: Scalp soft tissues and calvarium within normal limits. Sinuses/Orbits: Globes and orbital soft tissues within normal limits. Chronic right sphenoid sinusitis noted. Paranasal sinuses are otherwise clear. No mastoid  effusion. Other: None. IMPRESSION: 1. Little interval change in size and appearance of evolving subacute right MCA territory infarct. Subtle serpiginous hyperdensity within the area of infarction most consistent with faint petechial hemorrhage or possibly evolving laminar necrosis. No evidence for hemorrhagic transformation. 2. Otherwise stable appearance of the brain with additional chronic ischemic changes as above. No other new acute intracranial abnormality. Electronically Signed   By: Jeannine Boga M.D.   On: 09/18/2019 07:03   Ct Head Wo Contrast  Result Date: 09/17/2019 CLINICAL DATA:  Altered mental status EXAM: CT HEAD WITHOUT CONTRAST TECHNIQUE: Contiguous axial images were obtained from the base of the skull through the vertex without intravenous contrast. COMPARISON:  August 31, 2019 FINDINGS: Brain: Since the prior exam there is a increasing area of hypoattenuation within the right frontoparietal lobe, best seen on series 3, image 23. Again seen are changes of prior left MCA distribution infarct. There is no significant interval progression. There is dilatation the ventricles and sulci consistent with age-related atrophy. Low-attenuation changes in the deep white matter consistent with small vessel ischemia. No extra-axial collections. Vascular: No hyperdense vessel or unexpected calcification. Skull: The skull is intact. No fracture or focal lesion identified. Sinuses/Orbits: The visualized paranasal sinuses and mastoid air cells are clear. The orbits and globes intact. Other: None IMPRESSION: 1. worsening area of hypoattenuation in the right frontoparietal lobe, consistent  evolution of infarct. 2. Stable changes of prior infarct involving the left MCA territory. 3. Findings consistent with age related atrophy and chronic small vessel ischemia 4. These results were called by telephone at the time of interpretation on 09/17/2019 at 1:23 am to provider [facility] , who verbally  acknowledged these results. Electronically Signed   By: Prudencio Pair M.D.   On: 09/17/2019 01:24   Ct Head Wo Contrast  Result Date: 08/31/2019 CLINICAL DATA:  78 year old with 2 week history of generalized weakness and 3 falls in the past 2 days. Initial encounter. EXAM: CT HEAD WITHOUT CONTRAST TECHNIQUE: Contiguous axial images were obtained from the base of the skull through the vertex without intravenous contrast. COMPARISON:  08/14/2019 and earlier. FINDINGS: Brain: Moderate to severe cortical and deep atrophy, unchanged. Moderate to severe changes of small vessel disease of the white matter, unchanged. LEFT middle artery distribution stroke, unchanged since the CT three weeks ago, without evidence of interval progression. No new intracranial abnormalities. No acute hemorrhage or hematoma. No extra-axial fluid collection. Vascular: Severe BILATERAL carotid siphon and BILATERAL vertebral artery atherosclerosis. No hyperdense vessel. Skull: Hyperostosis frontalis interna. No skull fracture or other focal osseous abnormality involving the skull. Sinuses/Orbits: Visualized paranasal sinuses, bilateral mastoid air cells and bilateral middle ear cavities well-aerated. Visualized orbits and globes normal in appearance. Other: None. IMPRESSION: 1. No acute intracranial abnormality. 2. Stable moderate to severe generalized atrophy and moderate to severe chronic microvascular ischemic changes of the white matter. 3. LEFT middle artery distribution stroke, unchanged. Electronically Signed   By: Evangeline Dakin M.D.   On: 08/31/2019 18:09   Ct Angio Neck W Or Wo Contrast  Result Date: 09/17/2019 CLINICAL DATA:  Follow-up examination for acute stroke, left-sided weakness. EXAM: CT ANGIOGRAPHY HEAD AND NECK TECHNIQUE: Multidetector CT imaging of the head and neck was performed using the standard protocol during bolus administration of intravenous contrast. Multiplanar CT image reconstructions and MIPs were  obtained to evaluate the vascular anatomy. Carotid stenosis measurements (when applicable) are obtained utilizing NASCET criteria, using the distal internal carotid diameter as the denominator. CONTRAST:  15m OMNIPAQUE IOHEXOL 350 MG/ML SOLN COMPARISON:  Prior CT from earlier same day as well as previous CTA from 05/19/2018. FINDINGS: CTA NECK FINDINGS Aortic arch: Visualized aortic arch of normal caliber with normal 3 vessel morphology. Moderate atherosclerotic change about the arch and origin of the great vessels without hemodynamically significant stenosis. Extensive atherosclerotic change seen throughout the visualized left subclavian artery without high-grade stenosis. There is a severe near occlusive stenosis involving the mid right subclavian artery, grossly stable from previous (series 7, image 278). Right carotid system: Right CCA patent from its origin to the bifurcation without stenosis. Bulky calcified plaque about the right bifurcation with associated stenosis of up to 65% by NASCET criteria. Distally, the right ICA is somewhat irregular and attenuated, and diffusely narrowed as compared to the previous CTA, concerning for possible dissection (series 7, images 179-155). Finding is age indeterminate. No frank raised dissection flap visualized. Secondary moderate diffuse narrowing over the affected segment. Right ICA otherwise patent at the skull base. Left carotid system: Mild atheromatous stenosis at the origin of the left CCA. Left CCA otherwise widely patent to the bifurcation without significant stenosis. Bulky calcified plaque at the left bifurcation with associated stenosis of up to 65% by NASCET criteria. Superimposed small penetrating plaque noted at the proximal left ICA (series 7, image 213). Scattered multifocal centric plaque within the distal left ICA without significant stenosis.  Left ICA otherwise patent to the skull base. Vertebral arteries: Both vertebral arteries arise from the  subclavian arteries. Right vertebral artery dominant with a diffusely hypoplastic left vertebral artery. Focal plaque at the origin of the right vertebral artery with secondary severe stenosis, grossly stable from previous. Right vertebral otherwise widely patent to the skull base. Hypoplastic left vertebral artery severely diseased, and essentially occludes at the skull base, relatively stable from previous. Skeleton: No acute osseous finding. No discrete osseous lesions. Moderate cervical spondylosis at C4-5 through C6-7. Other neck: No other acute soft tissue abnormality within the neck. Upper chest: Few mildly enlarged right paratracheal and pretracheal nodes measure up to 13 mm, indeterminate, but could be reactive. Small layering bilateral pleural effusions with associated atelectasis. Diffuse interlobular septal thickening compatible with pulmonary interstitial edema. Subcentimeter calcified granuloma noted at the central aspect of the left upper lobe, stable. Review of the MIP images confirms the above findings CTA HEAD FINDINGS Anterior circulation: Petrous left ICA patent. Extensive atheromatous plaque throughout the left carotid siphon with associated moderate diffuse narrowing. A more focal severe stenosis again noted at the supraclinoid left ICA, stable (series 7, image 114). Left ICA terminus perfused. Left M1 irregular but patent without high-grade stenosis. Negative left MCA bifurcation. Extensive small vessel atheromatous irregularity throughout the left MCA branches without large vessel occlusion. Previously seen left M3 occlusion has recanalized. Petrous right ICA patent. Extensive plaque throughout the right carotid siphon with moderate to severe multifocal narrowing, similar to previous. Right ICA terminus perfused. Right M1 irregular but patent and bifurcates early. Extensive atheromatous irregularity throughout the right MCA branches without proximal occlusion. A1 segments irregular but patent  bilaterally. Right A1 hypoplastic. Normal anterior communicating artery. Extensive atheromatous change throughout the left A2 segment with up to moderate multifocal stenoses. Right A2 severely disease and attenuated with multifocal severe stenoses, although does remain patent to its distal aspect. Appearance is stable from previous. 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 Moderate atherosclerotic change throughout the dominant right V4 segment with associated moderate to severe multifocal stenoses, stable. Right PICA patent proximally. Left PICA not visualized. Basilar diminutive with diffuse atheromatous irregularity but remains patent to its distal aspect, unchanged. Superior cerebral arteries patent bilaterally. Both of the posterior cerebral arteries primarily supplied via the basilar. PCAs demonstrate extensive atheromatous irregularity but are patent to their distal aspects. Venous sinuses: Grossly patent allowing for timing of the contrast bolus. Anatomic variants: None significant. Review of the MIP images confirms the above findings IMPRESSION: 1. Negative CTA for large vessel occlusion. 2. Moderate diffuse narrowing of the mid-distal cervical right ICA, new/progressed relative to previous CTA from 2019, and suspicious for possible interval dissection, age indeterminate. No raised dissection flap or significant luminal irregularity. 3. 65% atheromatous stenoses about the carotid bifurcations bilaterally. 4. Severe and extensive atherosclerotic change throughout the intracranial circulation, relatively unchanged from previous. Again, most notable findings include multifocal severe right A2 and V4 stenoses. 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 5. Short-segment severe proximal right V1 stenosis. 6. Severe near occlusive stenosis at the mid right subclavian artery. 7. Small layering bilateral pleural effusions with associated  pulmonary interstitial edema. Electronically Signed   By: Jeannine Boga M.D.   On: 09/17/2019 04:40   Ct Cervical Spine Wo Contrast  Result Date: 09/24/2019 CLINICAL DATA:  Central pain syndrome. Previous fall/trauma. Severe radicular pain. EXAM: CT CERVICAL SPINE WITHOUT CONTRAST TECHNIQUE: Multidetector CT imaging of the cervical spine was  performed without intravenous contrast. Multiplanar CT image reconstructions were also generated. COMPARISON:  07/24/2019 FINDINGS: Alignment: Straightening of the normal cervical lordosis. No subluxation. Skull base and vertebrae: Vertebral body heights are maintained. There is mild spondylosis throughout the cervical spine. There is uncovertebral joint spurring and facet arthropathy. Non fusion of the midline posterior arch of C1. Moderate bilateral neural foraminal narrowing from the C4-5 level to the C6-7 level. No evidence of acute fracture or subluxation. Soft tissues and spinal canal: No prevertebral fluid or swelling. No visible canal hematoma. Disc levels: Moderate disc space narrowing from the C4-5 level to the C6-7 level. Upper chest: No acute findings. Other: None. IMPRESSION: 1.  No acute findings. 2. Mild spondylosis throughout the cervical spine. Disc disease from the C4-5 level to the C6-7 level. Bilateral neural foraminal narrowing from the C4-5 to the C6-7 level. Electronically Signed   By: Marin Olp M.D.   On: 09/24/2019 15:29   Dg Hand 2 View Left  Result Date: 09/17/2019 CLINICAL DATA:  Pt brought in for multiple falls and left-sided weakness. AMS. Pt states hx of broken wrist APPROX.1 month ago. Pt c/o pain on entire left side. Pt unable to move arm at all, limited ROM with external rotation. Screaming with every touch. EXAM: The COMPARISON:  None. FINDINGS: Lucency through the distal radial metaphysis with disruption of the articular surface consistent with incomplete healing of fracture demonstrated on radiograph 06/29/2019. Fracture  is intra-articular. No acute fracture in the wrist. Degenerative change at the first carpometacarpal joint. IMPRESSION: Incomplete healing of distal radial fracture. Electronically Signed   By: Suzy Bouchard M.D.   On: 09/17/2019 12:22   Dg Chest Port 1 View  Result Date: 09/16/2019 CLINICAL DATA:  Hypotension EXAM: PORTABLE CHEST 1 VIEW COMPARISON:  08/31/2019 FINDINGS: Mild cardiomegaly with aortic atherosclerosis. Streaky atelectasis at the bases. No consolidation, pleural effusion, or pneumothorax. IMPRESSION: 1. Mild cardiomegaly with minimal basilar atelectasis. Electronically Signed   By: Donavan Foil M.D.   On: 09/16/2019 22:52   Dg Chest Port 1 View  Result Date: 08/31/2019 CLINICAL DATA:  Weakness, history of hypertension. EXAM: PORTABLE CHEST 1 VIEW COMPARISON:  08/14/2019 FINDINGS: Lungs are clear. Cardiomediastinal contours are stable with signs of atherosclerosis in the thoracic aorta. Degenerative changes are noted in the glenohumeral joints bilaterally. No acute bone finding. IMPRESSION: No acute cardiopulmonary disease. Electronically Signed   By: Zetta Bills M.D.   On: 08/31/2019 13:54   Dg Humerus Left  Result Date: 09/17/2019 CLINICAL DATA:  Left arm pain. EXAM: LEFT HUMERUS - 2+ VIEW COMPARISON:  Shoulder radiograph 06/04/2019 FINDINGS: Cortical margins of the humerus are intact. Humeral head osteophytes with advanced glenohumeral osteoarthritis demonstrated on prior shoulder exam. Cortical margins of the humerus are intact. There is no evidence of fracture or other focal bone lesions. Soft tissues are unremarkable. IMPRESSION: No acute findings. Advanced shoulder osteoarthritis, demonstrated on prior shoulder exam. Electronically Signed   By: Keith Rake M.D.   On: 09/17/2019 00:42   Vas US Carotid  Result Date: 09/21/2019 Carotid Arterial Duplex Study Indications:       CVA. Risk Factors:      Hypertension, hyperlipidemia, coronary artery disease. Limitations         Today's exam was limited due to the patient's inability or                    unwillingness to cooperate. Comparison Study:  no prior Performing Technologist: Abram Sander RVS  Examination Guidelines:  A complete evaluation includes B-mode imaging, spectral Doppler, color Doppler, and power Doppler as needed of all accessible portions of each vessel. Bilateral testing is considered an integral part of a complete examination. Limited examinations for reoccurring indications may be performed as noted.  Right Carotid Findings: +----------+--------+--------+--------+-------------------------+--------------+           PSV cm/sEDV cm/sStenosisPlaque Description       Comments       +----------+--------+--------+--------+-------------------------+--------------+ CCA Prox  100                     heterogenous                            +----------+--------+--------+--------+-------------------------+--------------+ CCA Distal59                      heterogenous                            +----------+--------+--------+--------+-------------------------+--------------+ ICA Prox  39      10      1-39%   calcific and heterogenous               +----------+--------+--------+--------+-------------------------+--------------+ ICA Distal                                                 Not visualized +----------+--------+--------+--------+-------------------------+--------------+ ECA       132                     calcific                                +----------+--------+--------+--------+-------------------------+--------------+ +----------+--------+-------+--------+-------------------+           PSV cm/sEDV cmsDescribeArm Pressure (mmHG) +----------+--------+-------+--------+-------------------+ [id number]                                         +----------+--------+-------+--------+-------------------+ +---------+--------+--+--------+--+---------+ VertebralPSV  cm/s45EDV cm/s16Antegrade +---------+--------+--+--------+--+---------+  Left Carotid Findings: +----------+--------+--------+--------+-------------------------+--------+           PSV cm/sEDV cm/sStenosisPlaque Description       Comments +----------+--------+--------+--------+-------------------------+--------+ CCA Prox  102     22              heterogenous                      +----------+--------+--------+--------+-------------------------+--------+ CCA Distal107     15              heterogenous                      +----------+--------+--------+--------+-------------------------+--------+ ICA Prox  117     35      1-39%   heterogenous and calcific         +----------+--------+--------+--------+-------------------------+--------+ ICA Distal103     29                                                +----------+--------+--------+--------+-------------------------+--------+ ECA  187                                                       +----------+--------+--------+--------+-------------------------+--------+ +----------+--------+--------+--------+-------------------+           PSV cm/sEDV cm/sDescribeArm Pressure (mmHG) +----------+--------+--------+--------+-------------------+ [id number]                                         +----------+--------+--------+--------+-------------------+ +---------+--------+--------+--------------+ VertebralPSV cm/sEDV cm/sNot identified +---------+--------+--------+--------------+  Summary: Right Carotid: Velocities in the right ICA are consistent with a 1-39% stenosis. Left Carotid: Velocities in the left ICA are consistent with a 1-39% stenosis. Vertebrals: Right vertebral artery demonstrates antegrade flow. Left vertebral             artery was not visualized. *See table(s) above for measurements and observations.  Electronically signed by Antony Contras MD on 09/21/2019 at 3:31:18 PM.    Final        Subjective: Patient seen and examined.  No overnight events.  Neck pain persists, used 2 doses of oxycodone 5 mg last 24 hours and lidocaine patch.  Some heat helps as per patient.  Aware about going back to skilled nursing facility today.   Discharge Exam: Vitals:   09/28/19 0414 09/28/19 0415  BP: (!) 97/55   Pulse:    Resp: 16   Temp: 97.9 F (36.6 C)   SpO2: 96% 98%   Vitals:   09/27/19 2007 09/27/19 2341 09/28/19 0414 09/28/19 0415  BP: 115/81 104/72 (!) 97/55   Pulse: (!) 124 74    Resp: _0 Temp: (!) 97.4 F (36.3 C) 98.4 F (36.9 C) 97.9 F (36.6 C)   TempSrc: Oral Axillary Oral   SpO2: 99% 98% 96% 98%  Weight:      Height:        General: Pt is alert, sick looking, sleeping, complains of neck pain on waking up and movement.  Cardiovascular: RRR, S1/S2 +, no rubs, no gallops Respiratory: CTA bilaterally, no wheezing, no rhonchi Abdominal: Soft, NT, ND, bowel sounds + Extremities: no edema, no cyanosis Cranial nerves II through XII normal. Right gaze preference. Patient is anxious most of the time. Neck with no palpable tenderness or deformity. Not moving left arm or shoulder.  Also complains of pain at the shoulder joint with no deformity. Lower extremities weak but equal.    The results of significant diagnostics from this hospitalization (including imaging, microbiology, ancillary and laboratory) are listed below for reference.     Microbiology: Recent Results (from the past 240 hour(s))  SARS CORONAVIRUS 2 (TAT 6-24 HRS) Nasopharyngeal Nasopharyngeal Swab     Status: None   Collection Time: 09/20/19  5:15 PM   Specimen: Nasopharyngeal Swab  Result Value Ref Range Status   SARS Coronavirus 2 NEGATIVE NEGATIVE Final    Comment: (NOTE) SARS-CoV-2 target nucleic acids are NOT DETECTED. The SARS-CoV-2 RNA is generally detectable in upper and lower respiratory specimens during the acute phase of infection. Negative results do not preclude  SARS-CoV-2 infection, do not rule out co-infections with other pathogens, and should not be used as the sole basis for treatment or other patient management decisions. Negative results must be combined with clinical observations, patient history, and epidemiological information.  The expected result is Negative. Fact Sheet for Patients: SugarRoll.be Fact Sheet for Healthcare Providers: https://www.woods-mathews.com/ This test is not yet approved or cleared by the [place] FDA and  has been authorized for detection and/or diagnosis of SARS-CoV-2 by FDA under an Emergency Use Authorization (EUA). This EUA will remain  in effect (meaning this test can be used) for the duration of the COVID-19 declaration under Section 56 4(b)(1) of the Act, 21 U.S.C. section 360bbb-3(b)(1), unless the authorization is terminated or revoked sooner. Performed at [facility], [place] [address], [place], [place] [address]   SARS CORONAVIRUS 2 (TAT 6-24 HRS) Nasopharyngeal Nasopharyngeal Swab     Status: None   Collection Time: 09/23/19 11:50 AM   Specimen: Nasopharyngeal Swab  Result Value Ref Range Status   SARS Coronavirus 2 NEGATIVE NEGATIVE Final    Comment: (NOTE) SARS-CoV-2 target nucleic acids are NOT DETECTED. The SARS-CoV-2 RNA is generally detectable in upper and lower respiratory specimens during the acute phase of infection. Negative results do not preclude SARS-CoV-2 infection, do not rule out co-infections with other pathogens, and should not be used as the sole basis for treatment or other patient management decisions. Negative results must be combined with clinical observations, patient history, and epidemiological information. The expected result is Negative. Fact Sheet for Patients: SugarRoll.be Fact Sheet for Healthcare Providers: https://www.woods-mathews.com/ This test is not yet approved or  cleared by the [place] FDA and  has been authorized for detection and/or diagnosis of SARS-CoV-2 by FDA under an Emergency Use Authorization (EUA). This EUA will remain  in effect (meaning this test can be used) for the duration of the COVID-19 declaration under Section 56 4(b)(1) of the Act, 21 U.S.C. section 360bbb-3(b)(1), unless the authorization is terminated or revoked sooner. Performed at [facility], [place] [address], [place], [place] [address]   SARS CORONAVIRUS 2 (TAT 6-24 HRS) Nasopharyngeal Nasopharyngeal Swab     Status: None   Collection Time: 09/27/19 11:26 AM   Specimen: Nasopharyngeal Swab  Result Value Ref Range Status   SARS Coronavirus 2 NEGATIVE NEGATIVE Final    Comment: (NOTE) SARS-CoV-2 target nucleic acids are NOT DETECTED. The SARS-CoV-2 RNA is generally detectable in upper and lower respiratory specimens during the acute phase of infection. Negative results do not preclude SARS-CoV-2 infection, do not rule out co-infections with other pathogens, and should not be used as the sole basis for treatment or other patient management decisions. Negative results must be combined with clinical observations, patient history, and epidemiological information. The expected result is Negative. Fact Sheet for Patients: SugarRoll.be Fact Sheet for Healthcare Providers: https://www.woods-mathews.com/ This test is not yet approved or cleared by the [place] FDA and  has been authorized for detection and/or diagnosis of SARS-CoV-2 by FDA under an Emergency Use Authorization (EUA). This EUA will remain  in effect (meaning this test can be used) for the duration of the COVID-19 declaration under Section 56 4(b)(1) of the Act, 21 U.S.C. section 360bbb-3(b)(1), unless the authorization is terminated or revoked sooner. Performed at [facility], [place] [address], [place], [place] [address]      Labs: BNP (last  3 results) No results for input(s): BNP in the last 8760 hours. Basic Metabolic Panel: Recent Labs  Lab 09/22/19 0338  NA 137  K 3.8  CL 107  CO2 23  GLUCOSE 128*  BUN 10  CREATININE 0.84  CALCIUM 8.9  MG 2.0   Liver Function Tests: Recent Labs  Lab 09/22/19 0338  AST 23  ALT 19  ALKPHOS 87  BILITOT 0.5  PROT 5.4*  ALBUMIN 2.7*   No results for input(s): LIPASE, AMYLASE in the last 168 hours. No results for input(s): AMMONIA in the last 168 hours. CBC: Recent Labs  Lab 09/22/19 0338  WBC 7.4  HGB 9.9*  HCT 30.1*  MCV 83.4  PLT 253   Cardiac Enzymes: No results for input(s): CKTOTAL, CKMB, CKMBINDEX, TROPONINI in the last 168 hours. BNP: Invalid input(s): POCBNP CBG: Recent Labs  Lab 09/27/19 1301 09/27/19 1309 09/27/19 1547 09/27/19 2145 09/28/19 0630  GLUCAP 340* 328* 304* 154* 81   D-Dimer No results for input(s): DDIMER in the last 72 hours. Hgb A1c No results for input(s): HGBA1C in the last 72 hours. Lipid Profile No results for input(s): CHOL, HDL, LDLCALC, TRIG, CHOLHDL, LDLDIRECT in the last 72 hours. Thyroid function studies No results for input(s): TSH, T4TOTAL, T3FREE, THYROIDAB in the last 72 hours.  Invalid input(s): FREET3 Anemia work up No results for input(s): VITAMINB12, FOLATE, FERRITIN, TIBC, IRON, RETICCTPCT in the last 72 hours. Urinalysis    Component Value Date/Time   COLORURINE YELLOW 09/17/2019 0108   APPEARANCEUR HAZY (A) 09/17/2019 0108   LABSPEC 1.014 09/17/2019 0108   PHURINE 5.0 09/17/2019 0108   GLUCOSEU NEGATIVE 09/17/2019 0108   HGBUR LARGE (A) 09/17/2019 0108   BILIRUBINUR NEGATIVE 09/17/2019 0108   KETONESUR NEGATIVE 09/17/2019 0108   PROTEINUR NEGATIVE 09/17/2019 0108   UROBILINOGEN 0.2 07/15/2013 1819   NITRITE NEGATIVE 09/17/2019 0108   LEUKOCYTESUR LARGE (A) 09/17/2019 0108   Sepsis Labs Invalid input(s): PROCALCITONIN,  WBC,  LACTICIDVEN Microbiology Recent Results (from the past 240 hour(s))   SARS CORONAVIRUS 2 (TAT 6-24 HRS) Nasopharyngeal Nasopharyngeal Swab     Status: None   Collection Time: 09/20/19  5:15 PM   Specimen: Nasopharyngeal Swab  Result Value Ref Range Status   SARS Coronavirus 2 NEGATIVE NEGATIVE Final    Comment: (NOTE) SARS-CoV-2 target nucleic acids are NOT DETECTED. The SARS-CoV-2 RNA is generally detectable in upper and lower respiratory specimens during the acute phase of infection. Negative results do not preclude SARS-CoV-2 infection, do not rule out co-infections with other pathogens, and should not be used as the sole basis for treatment or other patient management decisions. Negative results must be combined with clinical observations, patient history, and epidemiological information. The expected result is Negative. Fact Sheet for Patients: SugarRoll.be Fact Sheet for Healthcare Providers: https://www.woods-mathews.com/ This test is not yet approved or cleared by the [place] FDA and  has been authorized for detection and/or diagnosis of SARS-CoV-2 by FDA under an Emergency Use Authorization (EUA). This EUA will remain  in effect (meaning this test can be used) for the duration of the COVID-19 declaration under Section 56 4(b)(1) of the Act, 21 U.S.C. section 360bbb-3(b)(1), unless the authorization is terminated or revoked sooner. Performed at [facility], [place] [address], [place], [place] [address]   SARS CORONAVIRUS 2 (TAT 6-24 HRS) Nasopharyngeal Nasopharyngeal Swab     Status: None   Collection Time: 09/23/19 11:50 AM   Specimen: Nasopharyngeal Swab  Result Value Ref Range Status   SARS Coronavirus 2 NEGATIVE NEGATIVE Final    Comment: (NOTE) SARS-CoV-2 target nucleic acids are NOT DETECTED. The SARS-CoV-2 RNA is generally detectable in upper and lower respiratory specimens during the acute phase of infection. Negative results do not preclude SARS-CoV-2 infection, do not rule  out co-infections with other pathogens, and should not  be used as the sole basis for treatment or other patient management decisions. Negative results must be combined with clinical observations, patient history, and epidemiological information. The expected result is Negative. Fact Sheet for Patients: SugarRoll.be Fact Sheet for Healthcare Providers: https://www.woods-mathews.com/ This test is not yet approved or cleared by the [place] FDA and  has been authorized for detection and/or diagnosis of SARS-CoV-2 by FDA under an Emergency Use Authorization (EUA). This EUA will remain  in effect (meaning this test can be used) for the duration of the COVID-19 declaration under Section 56 4(b)(1) of the Act, 21 U.S.C. section 360bbb-3(b)(1), unless the authorization is terminated or revoked sooner. Performed at [facility], [place] [address] [place], [place] [address]   SARS CORONAVIRUS 2 (TAT 6-24 HRS) Nasopharyngeal Nasopharyngeal Swab     Status: None   Collection Time: 09/27/19 11:26 AM   Specimen: Nasopharyngeal Swab  Result Value Ref Range Status   SARS Coronavirus 2 NEGATIVE NEGATIVE Final    Comment: (NOTE) SARS-CoV-2 target nucleic acids are NOT DETECTED. The SARS-CoV-2 RNA is generally detectable in upper and lower respiratory specimens during the acute phase of infection. Negative results do not preclude SARS-CoV-2 infection, do not rule out co-infections with other pathogens, and should not be used as the sole basis for treatment or other patient management decisions. Negative results must be combined with clinical observations, patient history, and epidemiological information. The expected result is Negative. Fact Sheet for Patients: SugarRoll.be Fact Sheet for Healthcare Providers: https://www.woods-mathews.com/ This test is not yet approved or cleared by the [place] FDA  and  has been authorized for detection and/or diagnosis of SARS-CoV-2 by FDA under an Emergency Use Authorization (EUA). This EUA will remain  in effect (meaning this test can be used) for the duration of the COVID-19 declaration under Section 56 4(b)(1) of the Act, 21 U.S.C. section 360bbb-3(b)(1), unless the authorization is terminated or revoked sooner. Performed at [facility], [place] [address] [place], [place] [address]      Time coordinating discharge:  35 minutes  SIGNED:   Barb Merino, MD  [facility] 09/28/2019, 9:11 AM

## 2019-09-28 NOTE — Progress Notes (Signed)
Report has been given to [facility]. Discharge paperwork to be sent with [facility]. All belongings to be sent with [facility]. [place]

## 2019-09-28 NOTE — TOC Transition Note (Signed)
Transition of Care [facility]) - CM/SW Discharge Note   Patient Details  Name: CASSIDEE DEATS MRN: [id number] Date of Birth: [birth year]  Transition of Care [facility]) CM/SW Contact:  Pollie Friar, RN Phone Number: 09/28/2019, 11:05 AM   Clinical Narrative:    Pt discharging to [facility] today. Spouse called and updated on d/c and timing of transport. Pt to transport via [facility]. [place] asking for her to arrive around 2 pm.  [facility] arranged and d/c packet at the desk.  Room: 1202P Number for report: [phone]   Final next level of care: Skilled Nursing Facility Barriers to Discharge: No Barriers Identified   Patient Goals and CMS Choice Patient states their goals for this hospitalization and ongoing recovery are:: patient unable to participate in goal setting due to confusion. CMS Medicare.gov Compare Post Acute Care list provided to:: Patient Represenative (must comment) Choice offered to / list presented to : Spouse  Discharge Placement              Patient chooses bed at: [facility] Patient to be transferred to facility by: [facility] at 1:30 Name of family member notified: spouse Patient and family notified of of transfer: 09/28/19  Discharge Plan and Services In-house Referral: Clinical Social Work   Post Acute Care Choice: [place]                               Social Determinants of Health (SDOH) Interventions     Readmission Risk Interventions No flowsheet data found.

## 2019-09-30 DIAGNOSIS — Z905 Acquired absence of kidney: Secondary | ICD-10-CM | POA: Diagnosis not present

## 2019-09-30 DIAGNOSIS — I63511 Cerebral infarction due to unspecified occlusion or stenosis of right middle cerebral artery: Secondary | ICD-10-CM | POA: Diagnosis not present

## 2019-09-30 DIAGNOSIS — G8929 Other chronic pain: Secondary | ICD-10-CM | POA: Diagnosis not present

## 2019-10-04 ENCOUNTER — Ambulatory Visit: Payer: Medicare Other

## 2019-10-04 ENCOUNTER — Other Ambulatory Visit: Payer: Self-pay | Admitting: *Deleted

## 2019-10-04 NOTE — Patient Outreach (Signed)
Member assessed for potential [facility] Care Management needs as a benefit of [place] Medicare.  Member is currently receiving rehab therapy [facility] SN[facility].  Member lived at home with husband prior. Active with [facility] Care Management recently.  Will continue to follow for disposition plans, progression, and for potential [facility] Care Management needs.    Marthenia Rolling, MSN-Ed, RN,BSN [employer] Acute Care Coordinator [phone] [facility]) [phone]  (Toll free office)

## 2019-10-05 DIAGNOSIS — I63511 Cerebral infarction due to unspecified occlusion or stenosis of right middle cerebral artery: Secondary | ICD-10-CM | POA: Diagnosis not present

## 2019-10-05 DIAGNOSIS — G8194 Hemiplegia, unspecified affecting left nondominant side: Secondary | ICD-10-CM | POA: Diagnosis not present

## 2019-10-05 DIAGNOSIS — G8929 Other chronic pain: Secondary | ICD-10-CM | POA: Diagnosis not present

## 2019-10-05 DIAGNOSIS — R4189 Other symptoms and signs involving cognitive functions and awareness: Secondary | ICD-10-CM | POA: Diagnosis not present

## 2019-10-07 ENCOUNTER — Ambulatory Visit: Payer: Medicare Other | Admitting: Cardiovascular Disease

## 2019-10-07 ENCOUNTER — Encounter

## 2019-10-07 ENCOUNTER — Other Ambulatory Visit: Payer: Self-pay | Admitting: *Deleted

## 2019-10-07 DIAGNOSIS — G8929 Other chronic pain: Secondary | ICD-10-CM | POA: Diagnosis not present

## 2019-10-07 DIAGNOSIS — I2510 Atherosclerotic heart disease of native coronary artery without angina pectoris: Secondary | ICD-10-CM | POA: Diagnosis not present

## 2019-10-07 DIAGNOSIS — R4189 Other symptoms and signs involving cognitive functions and awareness: Secondary | ICD-10-CM | POA: Diagnosis not present

## 2019-10-07 DIAGNOSIS — I63511 Cerebral infarction due to unspecified occlusion or stenosis of right middle cerebral artery: Secondary | ICD-10-CM | POA: Diagnosis not present

## 2019-10-07 NOTE — Patient Outreach (Signed)
Member assessed for potential [facility] Care Management needs as a benefit of  [place] Medicare.  Member is currently receiving rehab therapy at [facility].  Member discussed in weekly telephonic IDT meeting with facility staff, [facility] UM team, and writer.  Facility reports disposition plan is for long term care in another facility.  Will continue to follow for disposition plans and for potential [facility] Care Management needs.   Marthenia Rolling, MSN-Ed, RN,BSN [place] Acute Care Coordinator [phone] [facility]) [phone]  (Toll free office)

## 2019-10-12 DIAGNOSIS — I5032 Chronic diastolic (congestive) heart failure: Secondary | ICD-10-CM | POA: Diagnosis not present

## 2019-10-12 DIAGNOSIS — I63511 Cerebral infarction due to unspecified occlusion or stenosis of right middle cerebral artery: Secondary | ICD-10-CM | POA: Diagnosis not present

## 2019-10-12 DIAGNOSIS — G8929 Other chronic pain: Secondary | ICD-10-CM | POA: Diagnosis not present

## 2019-10-12 DIAGNOSIS — G8194 Hemiplegia, unspecified affecting left nondominant side: Secondary | ICD-10-CM | POA: Diagnosis not present

## 2019-10-14 ENCOUNTER — Other Ambulatory Visit: Payer: Self-pay | Admitting: *Deleted

## 2019-10-14 NOTE — Patient Outreach (Signed)
Member assessed for potential [facility] Care Management needs as a benefit of  [place] Medicare.  Member is currently receiving skilled therapy at [facility].  Member discussed in weekly telephonic IDT meeting with facility staff, [facility] UM team, and writer.  Facility reports disposition plan remains for Mrs. Bloch to transition to a different facility for long term placement upon SNF discharge. Therapy goal is for member to be more functional and 1 person assist. Currently she is moderate 2 person assist with therapy.  Will continue to follow while member resides in SNF for skilled therapy.  Marthenia Rolling, MSN-Ed, RN,BSN [place] Acute Care Coordinator [phone] [facility]) [phone]  (Toll free office)

## 2019-10-19 DIAGNOSIS — I480 Paroxysmal atrial fibrillation: Secondary | ICD-10-CM | POA: Diagnosis not present

## 2019-10-19 DIAGNOSIS — I5032 Chronic diastolic (congestive) heart failure: Secondary | ICD-10-CM | POA: Diagnosis not present

## 2019-10-19 DIAGNOSIS — D6489 Other specified anemias: Secondary | ICD-10-CM | POA: Diagnosis not present

## 2019-10-19 DIAGNOSIS — I63511 Cerebral infarction due to unspecified occlusion or stenosis of right middle cerebral artery: Secondary | ICD-10-CM | POA: Diagnosis not present

## 2019-10-19 DIAGNOSIS — S52502A Unspecified fracture of the lower end of left radius, initial encounter for closed fracture: Secondary | ICD-10-CM | POA: Diagnosis not present

## 2019-10-19 DIAGNOSIS — Z7189 Other specified counseling: Secondary | ICD-10-CM | POA: Diagnosis not present

## 2019-10-19 DIAGNOSIS — E118 Type 2 diabetes mellitus with unspecified complications: Secondary | ICD-10-CM | POA: Diagnosis not present

## 2019-10-19 DIAGNOSIS — I10 Essential (primary) hypertension: Secondary | ICD-10-CM | POA: Diagnosis not present

## 2019-10-19 DIAGNOSIS — G8929 Other chronic pain: Secondary | ICD-10-CM | POA: Diagnosis not present

## 2019-10-20 DIAGNOSIS — L0390 Cellulitis, unspecified: Secondary | ICD-10-CM | POA: Diagnosis not present

## 2019-10-20 DIAGNOSIS — N390 Urinary tract infection, site not specified: Secondary | ICD-10-CM | POA: Diagnosis not present

## 2019-10-28 DIAGNOSIS — M7989 Other specified soft tissue disorders: Secondary | ICD-10-CM | POA: Diagnosis not present

## 2019-10-28 DIAGNOSIS — L603 Nail dystrophy: Secondary | ICD-10-CM | POA: Diagnosis not present

## 2019-10-28 DIAGNOSIS — F451 Undifferentiated somatoform disorder: Secondary | ICD-10-CM | POA: Diagnosis not present

## 2019-10-28 DIAGNOSIS — R0789 Other chest pain: Secondary | ICD-10-CM | POA: Diagnosis not present

## 2019-10-31 ENCOUNTER — Other Ambulatory Visit: Payer: Self-pay

## 2019-10-31 ENCOUNTER — Encounter (HOSPITAL_COMMUNITY): Payer: Self-pay | Admitting: Emergency Medicine

## 2019-10-31 ENCOUNTER — Emergency Department (HOSPITAL_COMMUNITY): Payer: Medicare Other

## 2019-10-31 ENCOUNTER — Emergency Department (HOSPITAL_COMMUNITY)
Admission: EM | Admit: 2019-10-31 | Discharge: 2019-11-01 | Disposition: A | Discharge status: Home / Self Care | Payer: Medicare Other | Attending: Emergency Medicine | Admitting: Emergency Medicine

## 2019-10-31 DIAGNOSIS — Z7982 Long term (current) use of aspirin: Secondary | ICD-10-CM | POA: Insufficient documentation

## 2019-10-31 DIAGNOSIS — Y9389 Activity, other specified: Secondary | ICD-10-CM | POA: Diagnosis not present

## 2019-10-31 DIAGNOSIS — S199XXA Unspecified injury of neck, initial encounter: Secondary | ICD-10-CM | POA: Diagnosis not present

## 2019-10-31 DIAGNOSIS — E119 Type 2 diabetes mellitus without complications: Secondary | ICD-10-CM | POA: Insufficient documentation

## 2019-10-31 DIAGNOSIS — M25561 Pain in right knee: Secondary | ICD-10-CM | POA: Diagnosis not present

## 2019-10-31 DIAGNOSIS — M25512 Pain in left shoulder: Secondary | ICD-10-CM | POA: Insufficient documentation

## 2019-10-31 DIAGNOSIS — M25562 Pain in left knee: Secondary | ICD-10-CM | POA: Diagnosis not present

## 2019-10-31 DIAGNOSIS — S0990XA Unspecified injury of head, initial encounter: Secondary | ICD-10-CM | POA: Insufficient documentation

## 2019-10-31 DIAGNOSIS — R52 Pain, unspecified: Secondary | ICD-10-CM | POA: Diagnosis not present

## 2019-10-31 DIAGNOSIS — M545 Low back pain: Secondary | ICD-10-CM | POA: Insufficient documentation

## 2019-10-31 DIAGNOSIS — I10 Essential (primary) hypertension: Secondary | ICD-10-CM | POA: Diagnosis not present

## 2019-10-31 DIAGNOSIS — Z794 Long term (current) use of insulin: Secondary | ICD-10-CM | POA: Insufficient documentation

## 2019-10-31 DIAGNOSIS — W19XXXA Unspecified fall, initial encounter: Secondary | ICD-10-CM

## 2019-10-31 DIAGNOSIS — Z79899 Other long term (current) drug therapy: Secondary | ICD-10-CM | POA: Diagnosis not present

## 2019-10-31 DIAGNOSIS — M25511 Pain in right shoulder: Secondary | ICD-10-CM | POA: Diagnosis not present

## 2019-10-31 DIAGNOSIS — I2510 Atherosclerotic heart disease of native coronary artery without angina pectoris: Secondary | ICD-10-CM | POA: Insufficient documentation

## 2019-10-31 DIAGNOSIS — Y999 Unspecified external cause status: Secondary | ICD-10-CM | POA: Diagnosis not present

## 2019-10-31 DIAGNOSIS — I110 Hypertensive heart disease with heart failure: Secondary | ICD-10-CM | POA: Diagnosis not present

## 2019-10-31 DIAGNOSIS — W07XXXA Fall from chair, initial encounter: Secondary | ICD-10-CM | POA: Diagnosis not present

## 2019-10-31 DIAGNOSIS — S0512XA Contusion of eyeball and orbital tissues, left eye, initial encounter: Secondary | ICD-10-CM | POA: Diagnosis not present

## 2019-10-31 DIAGNOSIS — Y92199 Unspecified place in other specified residential institution as the place of occurrence of the external cause: Secondary | ICD-10-CM | POA: Insufficient documentation

## 2019-10-31 DIAGNOSIS — S0181XA Laceration without foreign body of other part of head, initial encounter: Secondary | ICD-10-CM | POA: Diagnosis not present

## 2019-10-31 DIAGNOSIS — E039 Hypothyroidism, unspecified: Secondary | ICD-10-CM | POA: Diagnosis not present

## 2019-10-31 DIAGNOSIS — I5032 Chronic diastolic (congestive) heart failure: Secondary | ICD-10-CM | POA: Insufficient documentation

## 2019-10-31 DIAGNOSIS — M546 Pain in thoracic spine: Secondary | ICD-10-CM | POA: Insufficient documentation

## 2019-10-31 DIAGNOSIS — M542 Cervicalgia: Secondary | ICD-10-CM | POA: Diagnosis not present

## 2019-10-31 DIAGNOSIS — S0083XA Contusion of other part of head, initial encounter: Secondary | ICD-10-CM

## 2019-10-31 DIAGNOSIS — E1165 Type 2 diabetes mellitus with hyperglycemia: Secondary | ICD-10-CM | POA: Diagnosis not present

## 2019-10-31 MED ORDER — HYDROCODONE-ACETAMINOPHEN 5-325 MG PO TABS
1.0000 | ORAL_TABLET | Freq: Once | ORAL | Status: AC
  Administered 2019-10-31: 1 via ORAL
  Filled 2019-10-31: qty 1

## 2019-10-31 MED ORDER — LIDOCAINE-EPINEPHRINE-TETRACAINE (LET) TOPICAL GEL
3.0000 mL | Freq: Once | TOPICAL | Status: AC
  Administered 2019-10-31: 3 mL via TOPICAL
  Filled 2019-10-31: qty 3

## 2019-10-31 NOTE — ED Notes (Signed)
[facility] called for pt. Discharge instructions discussed with pt and husband at bedside.

## 2019-10-31 NOTE — Discharge Instructions (Signed)
Use Tylenol and ice as needed for pain.  Follow-up with your primary doctor this week.

## 2019-10-31 NOTE — ED Notes (Signed)
Patient transported to CT 
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
## 2019-10-31 NOTE — ED Notes (Signed)
[name] [phone] pts husband would like a pt update

## 2019-10-31 NOTE — ED Triage Notes (Addendum)
Pt here from [facility] for fall. Pt in facility for stroke w/ L side and speech deficits. Today, pt was in wheelchair and fell out, got stuck between chair, table, and bed. Pt c/o bilat knee pain and head pain - lac above L eyebrow. Pt is on Eliquis, bleeding controlled. VSS. AO to self, place, and situation.

## 2019-11-01 DIAGNOSIS — H05222 Edema of left orbit: Secondary | ICD-10-CM | POA: Diagnosis not present

## 2019-11-01 DIAGNOSIS — G8194 Hemiplegia, unspecified affecting left nondominant side: Secondary | ICD-10-CM | POA: Diagnosis not present

## 2019-11-01 DIAGNOSIS — I63511 Cerebral infarction due to unspecified occlusion or stenosis of right middle cerebral artery: Secondary | ICD-10-CM | POA: Diagnosis not present

## 2019-11-01 DIAGNOSIS — W19XXXA Unspecified fall, initial encounter: Secondary | ICD-10-CM | POA: Diagnosis not present

## 2019-11-01 NOTE — ED Notes (Signed)
Attempted to call report x2

## 2019-11-01 NOTE — ED Notes (Signed)
[facility] at facility for transport 

## 2019-11-01 NOTE — ED Notes (Signed)
RN called report to [place].

## 2019-11-02 DIAGNOSIS — U071 COVID-19: Secondary | ICD-10-CM | POA: Diagnosis not present

## 2019-11-09 DIAGNOSIS — U071 COVID-19: Secondary | ICD-10-CM | POA: Diagnosis not present

## 2019-11-16 DIAGNOSIS — U071 COVID-19: Secondary | ICD-10-CM | POA: Diagnosis not present

## 2019-11-26 DIAGNOSIS — R1312 Dysphagia, oropharyngeal phase: Secondary | ICD-10-CM | POA: Diagnosis not present

## 2019-11-26 DIAGNOSIS — R2689 Other abnormalities of gait and mobility: Secondary | ICD-10-CM | POA: Diagnosis not present

## 2019-11-26 DIAGNOSIS — E1159 Type 2 diabetes mellitus with other circulatory complications: Secondary | ICD-10-CM | POA: Diagnosis not present

## 2019-11-26 DIAGNOSIS — R41841 Cognitive communication deficit: Secondary | ICD-10-CM | POA: Diagnosis not present

## 2019-11-26 DIAGNOSIS — I69354 Hemiplegia and hemiparesis following cerebral infarction affecting left non-dominant side: Secondary | ICD-10-CM | POA: Diagnosis not present

## 2019-11-26 DIAGNOSIS — M6281 Muscle weakness (generalized): Secondary | ICD-10-CM | POA: Diagnosis not present

## 2019-11-26 DIAGNOSIS — R278 Other lack of coordination: Secondary | ICD-10-CM | POA: Diagnosis not present

## 2019-11-26 DIAGNOSIS — R4701 Aphasia: Secondary | ICD-10-CM | POA: Diagnosis not present

## 2019-11-26 DIAGNOSIS — I69054 Hemiplegia and hemiparesis following nontraumatic subarachnoid hemorrhage affecting left non-dominant side: Secondary | ICD-10-CM | POA: Diagnosis not present

## 2019-11-26 DIAGNOSIS — E440 Moderate protein-calorie malnutrition: Secondary | ICD-10-CM | POA: Diagnosis not present

## 2019-11-27 DIAGNOSIS — R2689 Other abnormalities of gait and mobility: Secondary | ICD-10-CM | POA: Diagnosis not present

## 2019-11-27 DIAGNOSIS — I69054 Hemiplegia and hemiparesis following nontraumatic subarachnoid hemorrhage affecting left non-dominant side: Secondary | ICD-10-CM | POA: Diagnosis not present

## 2019-11-27 DIAGNOSIS — I69354 Hemiplegia and hemiparesis following cerebral infarction affecting left non-dominant side: Secondary | ICD-10-CM | POA: Diagnosis not present

## 2019-11-27 DIAGNOSIS — R278 Other lack of coordination: Secondary | ICD-10-CM | POA: Diagnosis not present

## 2019-11-27 DIAGNOSIS — R41841 Cognitive communication deficit: Secondary | ICD-10-CM | POA: Diagnosis not present

## 2019-11-27 DIAGNOSIS — M6281 Muscle weakness (generalized): Secondary | ICD-10-CM | POA: Diagnosis not present

## 2019-11-28 DIAGNOSIS — R41841 Cognitive communication deficit: Secondary | ICD-10-CM | POA: Diagnosis not present

## 2019-11-28 DIAGNOSIS — M6281 Muscle weakness (generalized): Secondary | ICD-10-CM | POA: Diagnosis not present

## 2019-11-28 DIAGNOSIS — I69054 Hemiplegia and hemiparesis following nontraumatic subarachnoid hemorrhage affecting left non-dominant side: Secondary | ICD-10-CM | POA: Diagnosis not present

## 2019-11-28 DIAGNOSIS — I69354 Hemiplegia and hemiparesis following cerebral infarction affecting left non-dominant side: Secondary | ICD-10-CM | POA: Diagnosis not present

## 2019-11-28 DIAGNOSIS — R278 Other lack of coordination: Secondary | ICD-10-CM | POA: Diagnosis not present

## 2019-11-28 DIAGNOSIS — R2689 Other abnormalities of gait and mobility: Secondary | ICD-10-CM | POA: Diagnosis not present

## 2019-11-29 ENCOUNTER — Ambulatory Visit: Payer: Medicare Other | Admitting: Emergency Medicine

## 2019-11-29 DIAGNOSIS — R278 Other lack of coordination: Secondary | ICD-10-CM | POA: Diagnosis not present

## 2019-11-29 DIAGNOSIS — I69054 Hemiplegia and hemiparesis following nontraumatic subarachnoid hemorrhage affecting left non-dominant side: Secondary | ICD-10-CM | POA: Diagnosis not present

## 2019-11-29 DIAGNOSIS — M6281 Muscle weakness (generalized): Secondary | ICD-10-CM | POA: Diagnosis not present

## 2019-11-29 DIAGNOSIS — I69354 Hemiplegia and hemiparesis following cerebral infarction affecting left non-dominant side: Secondary | ICD-10-CM | POA: Diagnosis not present

## 2019-11-29 DIAGNOSIS — R2689 Other abnormalities of gait and mobility: Secondary | ICD-10-CM | POA: Diagnosis not present

## 2019-11-29 DIAGNOSIS — R41841 Cognitive communication deficit: Secondary | ICD-10-CM | POA: Diagnosis not present

## 2019-11-30 DIAGNOSIS — I69354 Hemiplegia and hemiparesis following cerebral infarction affecting left non-dominant side: Secondary | ICD-10-CM | POA: Diagnosis not present

## 2019-11-30 DIAGNOSIS — M6281 Muscle weakness (generalized): Secondary | ICD-10-CM | POA: Diagnosis not present

## 2019-11-30 DIAGNOSIS — R2689 Other abnormalities of gait and mobility: Secondary | ICD-10-CM | POA: Diagnosis not present

## 2019-11-30 DIAGNOSIS — I69054 Hemiplegia and hemiparesis following nontraumatic subarachnoid hemorrhage affecting left non-dominant side: Secondary | ICD-10-CM | POA: Diagnosis not present

## 2019-11-30 DIAGNOSIS — R278 Other lack of coordination: Secondary | ICD-10-CM | POA: Diagnosis not present

## 2019-11-30 DIAGNOSIS — R41841 Cognitive communication deficit: Secondary | ICD-10-CM | POA: Diagnosis not present

## 2019-12-01 DIAGNOSIS — R41841 Cognitive communication deficit: Secondary | ICD-10-CM | POA: Diagnosis not present

## 2019-12-01 DIAGNOSIS — I69054 Hemiplegia and hemiparesis following nontraumatic subarachnoid hemorrhage affecting left non-dominant side: Secondary | ICD-10-CM | POA: Diagnosis not present

## 2019-12-01 DIAGNOSIS — R278 Other lack of coordination: Secondary | ICD-10-CM | POA: Diagnosis not present

## 2019-12-01 DIAGNOSIS — M6281 Muscle weakness (generalized): Secondary | ICD-10-CM | POA: Diagnosis not present

## 2019-12-01 DIAGNOSIS — U071 COVID-19: Secondary | ICD-10-CM | POA: Diagnosis not present

## 2019-12-01 DIAGNOSIS — I69354 Hemiplegia and hemiparesis following cerebral infarction affecting left non-dominant side: Secondary | ICD-10-CM | POA: Diagnosis not present

## 2019-12-01 DIAGNOSIS — R2689 Other abnormalities of gait and mobility: Secondary | ICD-10-CM | POA: Diagnosis not present

## 2019-12-02 DIAGNOSIS — R2689 Other abnormalities of gait and mobility: Secondary | ICD-10-CM | POA: Diagnosis not present

## 2019-12-02 DIAGNOSIS — I69054 Hemiplegia and hemiparesis following nontraumatic subarachnoid hemorrhage affecting left non-dominant side: Secondary | ICD-10-CM | POA: Diagnosis not present

## 2019-12-02 DIAGNOSIS — M6281 Muscle weakness (generalized): Secondary | ICD-10-CM | POA: Diagnosis not present

## 2019-12-02 DIAGNOSIS — I69354 Hemiplegia and hemiparesis following cerebral infarction affecting left non-dominant side: Secondary | ICD-10-CM | POA: Diagnosis not present

## 2019-12-02 DIAGNOSIS — R278 Other lack of coordination: Secondary | ICD-10-CM | POA: Diagnosis not present

## 2019-12-02 DIAGNOSIS — R41841 Cognitive communication deficit: Secondary | ICD-10-CM | POA: Diagnosis not present

## 2019-12-03 DIAGNOSIS — M25512 Pain in left shoulder: Secondary | ICD-10-CM | POA: Diagnosis not present

## 2019-12-03 DIAGNOSIS — I69354 Hemiplegia and hemiparesis following cerebral infarction affecting left non-dominant side: Secondary | ICD-10-CM | POA: Diagnosis not present

## 2019-12-03 DIAGNOSIS — I69054 Hemiplegia and hemiparesis following nontraumatic subarachnoid hemorrhage affecting left non-dominant side: Secondary | ICD-10-CM | POA: Diagnosis not present

## 2019-12-03 DIAGNOSIS — E119 Type 2 diabetes mellitus without complications: Secondary | ICD-10-CM | POA: Diagnosis not present

## 2019-12-03 DIAGNOSIS — R278 Other lack of coordination: Secondary | ICD-10-CM | POA: Diagnosis not present

## 2019-12-03 DIAGNOSIS — M6281 Muscle weakness (generalized): Secondary | ICD-10-CM | POA: Diagnosis not present

## 2019-12-03 DIAGNOSIS — S5292XA Unspecified fracture of left forearm, initial encounter for closed fracture: Secondary | ICD-10-CM | POA: Diagnosis not present

## 2019-12-03 DIAGNOSIS — R41841 Cognitive communication deficit: Secondary | ICD-10-CM | POA: Diagnosis not present

## 2019-12-03 DIAGNOSIS — R2689 Other abnormalities of gait and mobility: Secondary | ICD-10-CM | POA: Diagnosis not present

## 2019-12-04 DIAGNOSIS — I69354 Hemiplegia and hemiparesis following cerebral infarction affecting left non-dominant side: Secondary | ICD-10-CM | POA: Diagnosis not present

## 2019-12-04 DIAGNOSIS — M6281 Muscle weakness (generalized): Secondary | ICD-10-CM | POA: Diagnosis not present

## 2019-12-04 DIAGNOSIS — R278 Other lack of coordination: Secondary | ICD-10-CM | POA: Diagnosis not present

## 2019-12-04 DIAGNOSIS — R41841 Cognitive communication deficit: Secondary | ICD-10-CM | POA: Diagnosis not present

## 2019-12-04 DIAGNOSIS — I69054 Hemiplegia and hemiparesis following nontraumatic subarachnoid hemorrhage affecting left non-dominant side: Secondary | ICD-10-CM | POA: Diagnosis not present

## 2019-12-04 DIAGNOSIS — R2689 Other abnormalities of gait and mobility: Secondary | ICD-10-CM | POA: Diagnosis not present

## 2019-12-06 DIAGNOSIS — R41841 Cognitive communication deficit: Secondary | ICD-10-CM | POA: Diagnosis not present

## 2019-12-06 DIAGNOSIS — I69054 Hemiplegia and hemiparesis following nontraumatic subarachnoid hemorrhage affecting left non-dominant side: Secondary | ICD-10-CM | POA: Diagnosis not present

## 2019-12-06 DIAGNOSIS — I69354 Hemiplegia and hemiparesis following cerebral infarction affecting left non-dominant side: Secondary | ICD-10-CM | POA: Diagnosis not present

## 2019-12-06 DIAGNOSIS — M6281 Muscle weakness (generalized): Secondary | ICD-10-CM | POA: Diagnosis not present

## 2019-12-06 DIAGNOSIS — R2689 Other abnormalities of gait and mobility: Secondary | ICD-10-CM | POA: Diagnosis not present

## 2019-12-06 DIAGNOSIS — R278 Other lack of coordination: Secondary | ICD-10-CM | POA: Diagnosis not present

## 2019-12-07 DIAGNOSIS — R278 Other lack of coordination: Secondary | ICD-10-CM | POA: Diagnosis not present

## 2019-12-07 DIAGNOSIS — R2689 Other abnormalities of gait and mobility: Secondary | ICD-10-CM | POA: Diagnosis not present

## 2019-12-07 DIAGNOSIS — R41841 Cognitive communication deficit: Secondary | ICD-10-CM | POA: Diagnosis not present

## 2019-12-07 DIAGNOSIS — I69354 Hemiplegia and hemiparesis following cerebral infarction affecting left non-dominant side: Secondary | ICD-10-CM | POA: Diagnosis not present

## 2019-12-07 DIAGNOSIS — M6281 Muscle weakness (generalized): Secondary | ICD-10-CM | POA: Diagnosis not present

## 2019-12-07 DIAGNOSIS — I69054 Hemiplegia and hemiparesis following nontraumatic subarachnoid hemorrhage affecting left non-dominant side: Secondary | ICD-10-CM | POA: Diagnosis not present

## 2019-12-08 DIAGNOSIS — R2689 Other abnormalities of gait and mobility: Secondary | ICD-10-CM | POA: Diagnosis not present

## 2019-12-08 DIAGNOSIS — M6281 Muscle weakness (generalized): Secondary | ICD-10-CM | POA: Diagnosis not present

## 2019-12-08 DIAGNOSIS — I69354 Hemiplegia and hemiparesis following cerebral infarction affecting left non-dominant side: Secondary | ICD-10-CM | POA: Diagnosis not present

## 2019-12-08 DIAGNOSIS — U071 COVID-19: Secondary | ICD-10-CM | POA: Diagnosis not present

## 2019-12-08 DIAGNOSIS — R278 Other lack of coordination: Secondary | ICD-10-CM | POA: Diagnosis not present

## 2019-12-08 DIAGNOSIS — I69054 Hemiplegia and hemiparesis following nontraumatic subarachnoid hemorrhage affecting left non-dominant side: Secondary | ICD-10-CM | POA: Diagnosis not present

## 2019-12-08 DIAGNOSIS — R41841 Cognitive communication deficit: Secondary | ICD-10-CM | POA: Diagnosis not present

## 2019-12-09 ENCOUNTER — Ambulatory Visit (INDEPENDENT_AMBULATORY_CARE_PROVIDER_SITE_OTHER): Payer: Medicare Other

## 2019-12-09 ENCOUNTER — Other Ambulatory Visit: Payer: Self-pay

## 2019-12-09 ENCOUNTER — Ambulatory Visit (INDEPENDENT_AMBULATORY_CARE_PROVIDER_SITE_OTHER): Payer: Medicare Other | Admitting: Orthopaedic Surgery

## 2019-12-09 ENCOUNTER — Encounter: Payer: Self-pay | Admitting: Orthopaedic Surgery

## 2019-12-09 DIAGNOSIS — I69054 Hemiplegia and hemiparesis following nontraumatic subarachnoid hemorrhage affecting left non-dominant side: Secondary | ICD-10-CM | POA: Diagnosis not present

## 2019-12-09 DIAGNOSIS — M25512 Pain in left shoulder: Secondary | ICD-10-CM

## 2019-12-09 DIAGNOSIS — G8929 Other chronic pain: Secondary | ICD-10-CM

## 2019-12-09 DIAGNOSIS — M25511 Pain in right shoulder: Secondary | ICD-10-CM | POA: Insufficient documentation

## 2019-12-09 DIAGNOSIS — R531 Weakness: Secondary | ICD-10-CM

## 2019-12-09 DIAGNOSIS — M25532 Pain in left wrist: Secondary | ICD-10-CM

## 2019-12-09 DIAGNOSIS — R278 Other lack of coordination: Secondary | ICD-10-CM | POA: Diagnosis not present

## 2019-12-09 DIAGNOSIS — R41841 Cognitive communication deficit: Secondary | ICD-10-CM | POA: Diagnosis not present

## 2019-12-09 DIAGNOSIS — I69354 Hemiplegia and hemiparesis following cerebral infarction affecting left non-dominant side: Secondary | ICD-10-CM | POA: Diagnosis not present

## 2019-12-09 DIAGNOSIS — M6281 Muscle weakness (generalized): Secondary | ICD-10-CM | POA: Diagnosis not present

## 2019-12-09 DIAGNOSIS — R2689 Other abnormalities of gait and mobility: Secondary | ICD-10-CM | POA: Diagnosis not present

## 2019-12-09 DIAGNOSIS — S62102D Fracture of unspecified carpal bone, left wrist, subsequent encounter for fracture with routine healing: Secondary | ICD-10-CM
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
## 2019-12-10 DIAGNOSIS — I69054 Hemiplegia and hemiparesis following nontraumatic subarachnoid hemorrhage affecting left non-dominant side: Secondary | ICD-10-CM | POA: Diagnosis not present

## 2019-12-10 DIAGNOSIS — M25512 Pain in left shoulder: Secondary | ICD-10-CM | POA: Diagnosis not present

## 2019-12-10 DIAGNOSIS — R41841 Cognitive communication deficit: Secondary | ICD-10-CM | POA: Diagnosis not present

## 2019-12-10 DIAGNOSIS — I69354 Hemiplegia and hemiparesis following cerebral infarction affecting left non-dominant side: Secondary | ICD-10-CM | POA: Diagnosis not present

## 2019-12-10 DIAGNOSIS — R278 Other lack of coordination: Secondary | ICD-10-CM | POA: Diagnosis not present

## 2019-12-10 DIAGNOSIS — S5292XA Unspecified fracture of left forearm, initial encounter for closed fracture: Secondary | ICD-10-CM | POA: Diagnosis not present

## 2019-12-10 DIAGNOSIS — M6281 Muscle weakness (generalized): Secondary | ICD-10-CM | POA: Diagnosis not present

## 2019-12-10 DIAGNOSIS — R2689 Other abnormalities of gait and mobility: Secondary | ICD-10-CM | POA: Diagnosis not present

## 2019-12-11 DIAGNOSIS — R2689 Other abnormalities of gait and mobility: Secondary | ICD-10-CM | POA: Diagnosis not present

## 2019-12-11 DIAGNOSIS — I69054 Hemiplegia and hemiparesis following nontraumatic subarachnoid hemorrhage affecting left non-dominant side: Secondary | ICD-10-CM | POA: Diagnosis not present

## 2019-12-11 DIAGNOSIS — R278 Other lack of coordination: Secondary | ICD-10-CM | POA: Diagnosis not present

## 2019-12-11 DIAGNOSIS — M6281 Muscle weakness (generalized): Secondary | ICD-10-CM | POA: Diagnosis not present

## 2019-12-11 DIAGNOSIS — R41841 Cognitive communication deficit: Secondary | ICD-10-CM | POA: Diagnosis not present

## 2019-12-11 DIAGNOSIS — I69354 Hemiplegia and hemiparesis following cerebral infarction affecting left non-dominant side: Secondary | ICD-10-CM | POA: Diagnosis not present

## 2019-12-12 DIAGNOSIS — I69354 Hemiplegia and hemiparesis following cerebral infarction affecting left non-dominant side: Secondary | ICD-10-CM | POA: Diagnosis not present

## 2019-12-12 DIAGNOSIS — R278 Other lack of coordination: Secondary | ICD-10-CM | POA: Diagnosis not present

## 2019-12-12 DIAGNOSIS — I69054 Hemiplegia and hemiparesis following nontraumatic subarachnoid hemorrhage affecting left non-dominant side: Secondary | ICD-10-CM | POA: Diagnosis not present

## 2019-12-12 DIAGNOSIS — R2689 Other abnormalities of gait and mobility: Secondary | ICD-10-CM | POA: Diagnosis not present

## 2019-12-12 DIAGNOSIS — R41841 Cognitive communication deficit: Secondary | ICD-10-CM | POA: Diagnosis not present

## 2019-12-12 DIAGNOSIS — M6281 Muscle weakness (generalized): Secondary | ICD-10-CM | POA: Diagnosis not present

## 2019-12-13 DIAGNOSIS — R278 Other lack of coordination: Secondary | ICD-10-CM | POA: Diagnosis not present

## 2019-12-13 DIAGNOSIS — M6281 Muscle weakness (generalized): Secondary | ICD-10-CM | POA: Diagnosis not present

## 2019-12-13 DIAGNOSIS — I69054 Hemiplegia and hemiparesis following nontraumatic subarachnoid hemorrhage affecting left non-dominant side: Secondary | ICD-10-CM | POA: Diagnosis not present

## 2019-12-13 DIAGNOSIS — R2689 Other abnormalities of gait and mobility: Secondary | ICD-10-CM | POA: Diagnosis not present

## 2019-12-13 DIAGNOSIS — I69354 Hemiplegia and hemiparesis following cerebral infarction affecting left non-dominant side: Secondary | ICD-10-CM | POA: Diagnosis not present

## 2019-12-13 DIAGNOSIS — R41841 Cognitive communication deficit: Secondary | ICD-10-CM | POA: Diagnosis not present

## 2019-12-14 DIAGNOSIS — R2689 Other abnormalities of gait and mobility: Secondary | ICD-10-CM | POA: Diagnosis not present

## 2019-12-14 DIAGNOSIS — R278 Other lack of coordination: Secondary | ICD-10-CM | POA: Diagnosis not present

## 2019-12-14 DIAGNOSIS — M6281 Muscle weakness (generalized): Secondary | ICD-10-CM | POA: Diagnosis not present

## 2019-12-14 DIAGNOSIS — I69054 Hemiplegia and hemiparesis following nontraumatic subarachnoid hemorrhage affecting left non-dominant side: Secondary | ICD-10-CM | POA: Diagnosis not present

## 2019-12-14 DIAGNOSIS — R41841 Cognitive communication deficit: Secondary | ICD-10-CM | POA: Diagnosis not present

## 2019-12-14 DIAGNOSIS — I69354 Hemiplegia and hemiparesis following cerebral infarction affecting left non-dominant side: Secondary | ICD-10-CM | POA: Diagnosis not present

## 2019-12-15 DIAGNOSIS — I69354 Hemiplegia and hemiparesis following cerebral infarction affecting left non-dominant side: Secondary | ICD-10-CM | POA: Diagnosis not present

## 2019-12-15 DIAGNOSIS — I69054 Hemiplegia and hemiparesis following nontraumatic subarachnoid hemorrhage affecting left non-dominant side: Secondary | ICD-10-CM | POA: Diagnosis not present

## 2019-12-15 DIAGNOSIS — R2689 Other abnormalities of gait and mobility: Secondary | ICD-10-CM | POA: Diagnosis not present

## 2019-12-15 DIAGNOSIS — U071 COVID-19: Secondary | ICD-10-CM | POA: Diagnosis not present

## 2019-12-15 DIAGNOSIS — M6281 Muscle weakness (generalized): Secondary | ICD-10-CM | POA: Diagnosis not present

## 2019-12-15 DIAGNOSIS — R278 Other lack of coordination: Secondary | ICD-10-CM | POA: Diagnosis not present

## 2019-12-15 DIAGNOSIS — R41841 Cognitive communication deficit: Secondary | ICD-10-CM | POA: Diagnosis not present

## 2019-12-16 DIAGNOSIS — I63511 Cerebral infarction due to unspecified occlusion or stenosis of right middle cerebral artery: Secondary | ICD-10-CM | POA: Diagnosis not present

## 2019-12-16 DIAGNOSIS — I69054 Hemiplegia and hemiparesis following nontraumatic subarachnoid hemorrhage affecting left non-dominant side: Secondary | ICD-10-CM | POA: Diagnosis not present

## 2019-12-16 DIAGNOSIS — R2689 Other abnormalities of gait and mobility: Secondary | ICD-10-CM | POA: Diagnosis not present

## 2019-12-16 DIAGNOSIS — M25512 Pain in left shoulder: Secondary | ICD-10-CM | POA: Diagnosis not present

## 2019-12-16 DIAGNOSIS — I69354 Hemiplegia and hemiparesis following cerebral infarction affecting left non-dominant side: Secondary | ICD-10-CM | POA: Diagnosis not present

## 2019-12-16 DIAGNOSIS — R41841 Cognitive communication deficit: Secondary | ICD-10-CM | POA: Diagnosis not present

## 2019-12-16 DIAGNOSIS — M6281 Muscle weakness (generalized): Secondary | ICD-10-CM | POA: Diagnosis not present

## 2019-12-16 DIAGNOSIS — R278 Other lack of coordination: Secondary | ICD-10-CM | POA: Diagnosis not present

## 2019-12-17 DIAGNOSIS — M6281 Muscle weakness (generalized): Secondary | ICD-10-CM | POA: Diagnosis not present

## 2019-12-17 DIAGNOSIS — R278 Other lack of coordination: Secondary | ICD-10-CM | POA: Diagnosis not present

## 2019-12-17 DIAGNOSIS — I69354 Hemiplegia and hemiparesis following cerebral infarction affecting left non-dominant side: Secondary | ICD-10-CM | POA: Diagnosis not present

## 2019-12-17 DIAGNOSIS — R2689 Other abnormalities of gait and mobility: Secondary | ICD-10-CM | POA: Diagnosis not present

## 2019-12-17 DIAGNOSIS — I69054 Hemiplegia and hemiparesis following nontraumatic subarachnoid hemorrhage affecting left non-dominant side: Secondary | ICD-10-CM | POA: Diagnosis not present

## 2019-12-17 DIAGNOSIS — R41841 Cognitive communication deficit: Secondary | ICD-10-CM | POA: Diagnosis not present

## 2019-12-18 DIAGNOSIS — R278 Other lack of coordination: Secondary | ICD-10-CM | POA: Diagnosis not present

## 2019-12-18 DIAGNOSIS — I69054 Hemiplegia and hemiparesis following nontraumatic subarachnoid hemorrhage affecting left non-dominant side: Secondary | ICD-10-CM | POA: Diagnosis not present

## 2019-12-18 DIAGNOSIS — M6281 Muscle weakness (generalized): Secondary | ICD-10-CM | POA: Diagnosis not present

## 2019-12-18 DIAGNOSIS — I69354 Hemiplegia and hemiparesis following cerebral infarction affecting left non-dominant side: Secondary | ICD-10-CM | POA: Diagnosis not present

## 2019-12-18 DIAGNOSIS — R2689 Other abnormalities of gait and mobility: Secondary | ICD-10-CM | POA: Diagnosis not present

## 2019-12-18 DIAGNOSIS — R41841 Cognitive communication deficit: Secondary | ICD-10-CM | POA: Diagnosis not present

## 2019-12-19 DIAGNOSIS — I69054 Hemiplegia and hemiparesis following nontraumatic subarachnoid hemorrhage affecting left non-dominant side: Secondary | ICD-10-CM | POA: Diagnosis not present

## 2019-12-19 DIAGNOSIS — I69354 Hemiplegia and hemiparesis following cerebral infarction affecting left non-dominant side: Secondary | ICD-10-CM | POA: Diagnosis not present

## 2019-12-19 DIAGNOSIS — R41841 Cognitive communication deficit: Secondary | ICD-10-CM | POA: Diagnosis not present

## 2019-12-19 DIAGNOSIS — M6281 Muscle weakness (generalized): Secondary | ICD-10-CM | POA: Diagnosis not present

## 2019-12-19 DIAGNOSIS — R2689 Other abnormalities of gait and mobility: Secondary | ICD-10-CM | POA: Diagnosis not present

## 2019-12-19 DIAGNOSIS — R278 Other lack of coordination: Secondary | ICD-10-CM | POA: Diagnosis not present

## 2019-12-20 DIAGNOSIS — I69054 Hemiplegia and hemiparesis following nontraumatic subarachnoid hemorrhage affecting left non-dominant side: Secondary | ICD-10-CM | POA: Diagnosis not present

## 2019-12-20 DIAGNOSIS — I69354 Hemiplegia and hemiparesis following cerebral infarction affecting left non-dominant side: Secondary | ICD-10-CM | POA: Diagnosis not present

## 2019-12-20 DIAGNOSIS — R2689 Other abnormalities of gait and mobility: Secondary | ICD-10-CM | POA: Diagnosis not present

## 2019-12-20 DIAGNOSIS — R278 Other lack of coordination: Secondary | ICD-10-CM | POA: Diagnosis not present

## 2019-12-20 DIAGNOSIS — M6281 Muscle weakness (generalized): Secondary | ICD-10-CM | POA: Diagnosis not present

## 2019-12-20 DIAGNOSIS — R41841 Cognitive communication deficit: Secondary | ICD-10-CM | POA: Diagnosis not present

## 2019-12-21 ENCOUNTER — Encounter (HOSPITAL_COMMUNITY): Payer: Self-pay | Admitting: Emergency Medicine

## 2019-12-21 ENCOUNTER — Other Ambulatory Visit: Payer: Self-pay

## 2019-12-21 ENCOUNTER — Emergency Department (HOSPITAL_COMMUNITY): Payer: Medicare Other

## 2019-12-21 ENCOUNTER — Telehealth: Payer: Self-pay | Admitting: Emergency Medicine

## 2019-12-21 ENCOUNTER — Emergency Department (HOSPITAL_COMMUNITY)
Admission: EM | Admit: 2019-12-21 | Discharge: 2019-12-22 | Disposition: A | Discharge status: Home / Self Care | Payer: Medicare Other | Attending: Emergency Medicine | Admitting: Emergency Medicine

## 2019-12-21 DIAGNOSIS — R278 Other lack of coordination: Secondary | ICD-10-CM | POA: Diagnosis not present

## 2019-12-21 DIAGNOSIS — E039 Hypothyroidism, unspecified: Secondary | ICD-10-CM | POA: Insufficient documentation

## 2019-12-21 DIAGNOSIS — I110 Hypertensive heart disease with heart failure: Secondary | ICD-10-CM | POA: Insufficient documentation

## 2019-12-21 DIAGNOSIS — Z7982 Long term (current) use of aspirin: Secondary | ICD-10-CM | POA: Diagnosis not present

## 2019-12-21 DIAGNOSIS — I4891 Unspecified atrial fibrillation: Secondary | ICD-10-CM | POA: Diagnosis not present

## 2019-12-21 DIAGNOSIS — R0902 Hypoxemia: Secondary | ICD-10-CM | POA: Diagnosis not present

## 2019-12-21 DIAGNOSIS — R9431 Abnormal electrocardiogram [ECG] [EKG]: Secondary | ICD-10-CM | POA: Diagnosis not present

## 2019-12-21 DIAGNOSIS — Z7901 Long term (current) use of anticoagulants: Secondary | ICD-10-CM | POA: Diagnosis not present

## 2019-12-21 DIAGNOSIS — R4182 Altered mental status, unspecified: Secondary | ICD-10-CM | POA: Diagnosis not present

## 2019-12-21 DIAGNOSIS — G8194 Hemiplegia, unspecified affecting left nondominant side: Secondary | ICD-10-CM | POA: Diagnosis not present

## 2019-12-21 DIAGNOSIS — Z794 Long term (current) use of insulin: Secondary | ICD-10-CM | POA: Insufficient documentation

## 2019-12-21 DIAGNOSIS — I5032 Chronic diastolic (congestive) heart failure: Secondary | ICD-10-CM | POA: Diagnosis not present

## 2019-12-21 DIAGNOSIS — E1165 Type 2 diabetes mellitus with hyperglycemia: Secondary | ICD-10-CM | POA: Diagnosis not present

## 2019-12-21 DIAGNOSIS — I69054 Hemiplegia and hemiparesis following nontraumatic subarachnoid hemorrhage affecting left non-dominant side: Secondary | ICD-10-CM | POA: Diagnosis not present

## 2019-12-21 DIAGNOSIS — I2510 Atherosclerotic heart disease of native coronary artery without angina pectoris: Secondary | ICD-10-CM | POA: Insufficient documentation

## 2019-12-21 DIAGNOSIS — I10 Essential (primary) hypertension: Secondary | ICD-10-CM | POA: Diagnosis not present

## 2019-12-21 DIAGNOSIS — I480 Paroxysmal atrial fibrillation: Secondary | ICD-10-CM | POA: Diagnosis not present

## 2019-12-21 DIAGNOSIS — R4789 Other speech disturbances: Secondary | ICD-10-CM | POA: Insufficient documentation

## 2019-12-21 DIAGNOSIS — R41841 Cognitive communication deficit: Secondary | ICD-10-CM | POA: Diagnosis not present

## 2019-12-21 DIAGNOSIS — Z79899 Other long term (current) drug therapy: Secondary | ICD-10-CM | POA: Insufficient documentation

## 2019-12-21 DIAGNOSIS — M6281 Muscle weakness (generalized): Secondary | ICD-10-CM | POA: Diagnosis not present

## 2019-12-21 DIAGNOSIS — I63511 Cerebral infarction due to unspecified occlusion or stenosis of right middle cerebral artery: Secondary | ICD-10-CM | POA: Diagnosis not present

## 2019-12-21 DIAGNOSIS — R471 Dysarthria and anarthria: Secondary | ICD-10-CM | POA: Diagnosis not present

## 2019-12-21 DIAGNOSIS — R2689 Other abnormalities of gait and mobility: Secondary | ICD-10-CM | POA: Diagnosis not present

## 2019-12-21 DIAGNOSIS — M79603 Pain in arm, unspecified: Secondary | ICD-10-CM | POA: Diagnosis not present

## 2019-12-21 DIAGNOSIS — R479 Unspecified speech disturbances: Secondary | ICD-10-CM | POA: Diagnosis not present

## 2019-12-21 DIAGNOSIS — R29898 Other symptoms and signs involving the musculoskeletal system: Secondary | ICD-10-CM | POA: Diagnosis not present

## 2019-12-21 DIAGNOSIS — E119 Type 2 diabetes mellitus without complications: Secondary | ICD-10-CM | POA: Insufficient documentation

## 2019-12-21 DIAGNOSIS — I69354 Hemiplegia and hemiparesis following cerebral infarction affecting left non-dominant side: Secondary | ICD-10-CM | POA: Diagnosis not present

## 2019-12-21 DIAGNOSIS — R52 Pain, unspecified: Secondary | ICD-10-CM | POA: Diagnosis not present

## 2019-12-21 DIAGNOSIS — R000 Tachycardia, unspecified: Secondary | ICD-10-CM | POA: Diagnosis not present

## 2019-12-21 LAB — RAPID URINE DRUG SCREEN, HOSP PERFORMED
Amphetamines: NOT DETECTED
Barbiturates: NOT DETECTED
Benzodiazepines: NOT DETECTED
Cocaine: NOT DETECTED
Opiates: NOT DETECTED
Tetrahydrocannabinol: NOT DETECTED

## 2019-12-21 LAB — COMPREHENSIVE METABOLIC PANEL
ALT: 17 U/L (ref 0–44)
AST: 23 U/L (ref 15–41)
Albumin: 3.7 g/dL (ref 3.5–5.0)
Alkaline Phosphatase: 125 U/L (ref 38–126)
Anion gap: 13 (ref 5–15)
BUN: 17 mg/dL (ref 8–23)
CO2: 24 mmol/L (ref 22–32)
Calcium: 9.8 mg/dL (ref 8.9–10.3)
Chloride: 102 mmol/L (ref 98–111)
Creatinine, Ser: 0.9 mg/dL (ref 0.44–1.00)
GFR calc Af Amer: >60 mL/min (ref >60)
GFR calc non Af Amer: >60 mL/min (ref >60)
Glucose, Bld: 257 mg/dL — ABNORMAL HIGH (ref 70–99)
Potassium: 4.1 mmol/L (ref 3.5–5.1)
Sodium: 139 mmol/L (ref 135–145)
Total Bilirubin: 0.9 mg/dL (ref 0.3–1.2)
Total Protein: 7 g/dL (ref 6.5–8.1)

## 2019-12-21 LAB — CBC WITH DIFFERENTIAL/PLATELET
Abs Immature Granulocytes: 0.03 10*3/uL (ref 0.00–0.07)
Basophils Absolute: 0.1 10*3/uL (ref 0.0–0.1)
Basophils Relative: 1 %
Eosinophils Absolute: 0.2 10*3/uL (ref 0.0–0.5)
Eosinophils Relative: 2 %
HCT: 38.5 % (ref 36.0–46.0)
Hemoglobin: 11.7 g/dL — ABNORMAL LOW (ref 12.0–15.0)
Immature Granulocytes: 0 %
Lymphocytes Relative: 18 %
Lymphs Abs: 1.9 10*3/uL (ref 0.7–4.0)
MCH: 24.9 pg — ABNORMAL LOW (ref 26.0–34.0)
MCHC: 30.4 g/dL (ref 30.0–36.0)
MCV: 82.1 fL (ref 80.0–100.0)
Monocytes Absolute: 0.8 10*3/uL (ref 0.1–1.0)
Monocytes Relative: 8 %
Neutro Abs: 7.3 10*3/uL (ref 1.7–7.7)
Neutrophils Relative %: 71 %
Platelets: 321 10*3/uL (ref 150–400)
RBC: 4.69 MIL/uL (ref 3.87–5.11)
RDW: 14.5 % (ref 11.5–15.5)
WBC: 10.3 10*3/uL (ref 4.0–10.5)
nRBC: 0 % (ref 0.0–0.2)

## 2019-12-21 LAB — URINALYSIS, COMPLETE (UACMP) WITH MICROSCOPIC
Bacteria, UA: NONE SEEN
Bilirubin Urine: NEGATIVE
Glucose, UA: >=500 mg/dL — AB
Ketones, ur: NEGATIVE mg/dL
Leukocytes,Ua: NEGATIVE
Nitrite: NEGATIVE
Protein, ur: NEGATIVE mg/dL
Specific Gravity, Urine: 1.013 (ref 1.005–1.030)
pH: 6 (ref 5.0–8.0)

## 2019-12-21 LAB — CBG MONITORING, ED: Glucose-Capillary: 243 mg/dL — ABNORMAL HIGH (ref 70–99)

## 2019-12-21 LAB — ETHANOL: Alcohol, Ethyl (B): <10 mg/dL (ref <10)

## 2019-12-21 LAB — APTT: aPTT: 34 seconds (ref 24–36)

## 2019-12-21 LAB — PROTIME-INR
INR: 1.3 — ABNORMAL HIGH (ref 0.8–1.2)
Prothrombin Time: 16.3 seconds — ABNORMAL HIGH (ref 11.4–15.2)

## 2019-12-21 LAB — AMMONIA: Ammonia: 21 umol/L (ref 9–35)

## 2019-12-21 NOTE — ED Triage Notes (Signed)
Per ems and staff from [facility] , pts dr wanted  Her checked for slurred speech and  Not acting normal , answerers all ems questions well, has hx of CVA left side with continued deficit , hx of fall 2 weeks ago , with  Sling to left arm

## 2019-12-21 NOTE — ED Notes (Signed)
Report called to [facility].
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
## 2019-12-21 NOTE — ED Notes (Addendum)
RN attempted report to [facility].

## 2019-12-21 NOTE — Discharge Instructions (Addendum)
Patient is medically maximized for stroke. She has passed a stroke swallow screen. CT is negative. She is unable to get MRI. Neurology has evaluated and feels she is safe for discharge.

## 2019-12-22 DIAGNOSIS — R2689 Other abnormalities of gait and mobility: Secondary | ICD-10-CM | POA: Diagnosis not present

## 2019-12-22 DIAGNOSIS — M2550 Pain in unspecified joint: Secondary | ICD-10-CM | POA: Diagnosis not present

## 2019-12-22 DIAGNOSIS — I959 Hypotension, unspecified: Secondary | ICD-10-CM | POA: Diagnosis not present

## 2019-12-22 DIAGNOSIS — Z209 Contact with and (suspected) exposure to unspecified communicable disease: Secondary | ICD-10-CM | POA: Diagnosis not present

## 2019-12-22 DIAGNOSIS — R278 Other lack of coordination: Secondary | ICD-10-CM | POA: Diagnosis not present

## 2019-12-22 DIAGNOSIS — I69054 Hemiplegia and hemiparesis following nontraumatic subarachnoid hemorrhage affecting left non-dominant side: Secondary | ICD-10-CM | POA: Diagnosis not present

## 2019-12-22 DIAGNOSIS — M6281 Muscle weakness (generalized): Secondary | ICD-10-CM | POA: Diagnosis not present

## 2019-12-22 DIAGNOSIS — R4182 Altered mental status, unspecified: Secondary | ICD-10-CM | POA: Diagnosis not present

## 2019-12-22 DIAGNOSIS — R41841 Cognitive communication deficit: Secondary | ICD-10-CM | POA: Diagnosis not present

## 2019-12-22 DIAGNOSIS — I69354 Hemiplegia and hemiparesis following cerebral infarction affecting left non-dominant side: Secondary | ICD-10-CM | POA: Diagnosis not present

## 2019-12-22 DIAGNOSIS — Z7401 Bed confinement status: Secondary | ICD-10-CM | POA: Diagnosis not present

## 2019-12-22 NOTE — ED Notes (Signed)
TAR picked patient up to return to [facility]. PTAR given AVS. Report called to [facility] at 2023

## 2019-12-23 DIAGNOSIS — R2689 Other abnormalities of gait and mobility: Secondary | ICD-10-CM | POA: Diagnosis not present

## 2019-12-23 DIAGNOSIS — I69354 Hemiplegia and hemiparesis following cerebral infarction affecting left non-dominant side: Secondary | ICD-10-CM | POA: Diagnosis not present

## 2019-12-23 DIAGNOSIS — I69054 Hemiplegia and hemiparesis following nontraumatic subarachnoid hemorrhage affecting left non-dominant side: Secondary | ICD-10-CM | POA: Diagnosis not present

## 2019-12-23 DIAGNOSIS — R278 Other lack of coordination: Secondary | ICD-10-CM | POA: Diagnosis not present

## 2019-12-23 DIAGNOSIS — R41841 Cognitive communication deficit: Secondary | ICD-10-CM | POA: Diagnosis not present

## 2019-12-23 DIAGNOSIS — M6281 Muscle weakness (generalized): Secondary | ICD-10-CM | POA: Diagnosis not present

## 2019-12-24 DIAGNOSIS — E1165 Type 2 diabetes mellitus with hyperglycemia: Secondary | ICD-10-CM | POA: Diagnosis not present

## 2019-12-24 DIAGNOSIS — G8194 Hemiplegia, unspecified affecting left nondominant side: Secondary | ICD-10-CM | POA: Diagnosis not present

## 2019-12-24 DIAGNOSIS — I69354 Hemiplegia and hemiparesis following cerebral infarction affecting left non-dominant side: Secondary | ICD-10-CM | POA: Diagnosis not present

## 2019-12-24 DIAGNOSIS — R1319 Other dysphagia: Secondary | ICD-10-CM | POA: Diagnosis not present

## 2019-12-24 DIAGNOSIS — I63511 Cerebral infarction due to unspecified occlusion or stenosis of right middle cerebral artery: Secondary | ICD-10-CM | POA: Diagnosis not present

## 2019-12-24 DIAGNOSIS — R2689 Other abnormalities of gait and mobility: Secondary | ICD-10-CM | POA: Diagnosis not present

## 2019-12-24 DIAGNOSIS — I69054 Hemiplegia and hemiparesis following nontraumatic subarachnoid hemorrhage affecting left non-dominant side: Secondary | ICD-10-CM | POA: Diagnosis not present

## 2019-12-24 DIAGNOSIS — R278 Other lack of coordination: Secondary | ICD-10-CM | POA: Diagnosis not present

## 2019-12-24 DIAGNOSIS — M6281 Muscle weakness (generalized): Secondary | ICD-10-CM | POA: Diagnosis not present

## 2019-12-24 DIAGNOSIS — R41841 Cognitive communication deficit: Secondary | ICD-10-CM | POA: Diagnosis not present

## 2019-12-25 DIAGNOSIS — I69054 Hemiplegia and hemiparesis following nontraumatic subarachnoid hemorrhage affecting left non-dominant side: Secondary | ICD-10-CM | POA: Diagnosis not present

## 2019-12-25 DIAGNOSIS — M6281 Muscle weakness (generalized): Secondary | ICD-10-CM | POA: Diagnosis not present

## 2019-12-25 DIAGNOSIS — I69354 Hemiplegia and hemiparesis following cerebral infarction affecting left non-dominant side: Secondary | ICD-10-CM | POA: Diagnosis not present

## 2019-12-25 DIAGNOSIS — R2689 Other abnormalities of gait and mobility: Secondary | ICD-10-CM | POA: Diagnosis not present

## 2019-12-25 DIAGNOSIS — R278 Other lack of coordination: Secondary | ICD-10-CM | POA: Diagnosis not present

## 2019-12-25 DIAGNOSIS — R41841 Cognitive communication deficit: Secondary | ICD-10-CM | POA: Diagnosis not present

## 2019-12-26 DIAGNOSIS — R41841 Cognitive communication deficit: Secondary | ICD-10-CM | POA: Diagnosis not present

## 2019-12-26 DIAGNOSIS — M6281 Muscle weakness (generalized): Secondary | ICD-10-CM | POA: Diagnosis not present

## 2019-12-26 DIAGNOSIS — I69054 Hemiplegia and hemiparesis following nontraumatic subarachnoid hemorrhage affecting left non-dominant side: Secondary | ICD-10-CM | POA: Diagnosis not present

## 2019-12-26 DIAGNOSIS — I69354 Hemiplegia and hemiparesis following cerebral infarction affecting left non-dominant side: Secondary | ICD-10-CM | POA: Diagnosis not present

## 2019-12-26 DIAGNOSIS — R2689 Other abnormalities of gait and mobility: Secondary | ICD-10-CM | POA: Diagnosis not present

## 2019-12-26 DIAGNOSIS — R278 Other lack of coordination: Secondary | ICD-10-CM | POA: Diagnosis not present

## 2019-12-27 DIAGNOSIS — R278 Other lack of coordination: Secondary | ICD-10-CM | POA: Diagnosis not present

## 2019-12-27 DIAGNOSIS — I69354 Hemiplegia and hemiparesis following cerebral infarction affecting left non-dominant side: Secondary | ICD-10-CM | POA: Diagnosis not present

## 2019-12-27 DIAGNOSIS — I69054 Hemiplegia and hemiparesis following nontraumatic subarachnoid hemorrhage affecting left non-dominant side: Secondary | ICD-10-CM | POA: Diagnosis not present

## 2019-12-27 DIAGNOSIS — E1159 Type 2 diabetes mellitus with other circulatory complications: Secondary | ICD-10-CM | POA: Diagnosis not present

## 2019-12-27 DIAGNOSIS — M6281 Muscle weakness (generalized): Secondary | ICD-10-CM | POA: Diagnosis not present

## 2019-12-27 DIAGNOSIS — E440 Moderate protein-calorie malnutrition: Secondary | ICD-10-CM | POA: Diagnosis not present

## 2019-12-27 DIAGNOSIS — R1312 Dysphagia, oropharyngeal phase: Secondary | ICD-10-CM | POA: Diagnosis not present

## 2019-12-27 DIAGNOSIS — R2689 Other abnormalities of gait and mobility: Secondary | ICD-10-CM | POA: Diagnosis not present

## 2019-12-27 DIAGNOSIS — R41841 Cognitive communication deficit: Secondary | ICD-10-CM | POA: Diagnosis not present

## 2019-12-27 DIAGNOSIS — R4701 Aphasia: Secondary | ICD-10-CM | POA: Diagnosis not present

## 2019-12-28 DIAGNOSIS — F3489 Other specified persistent mood disorders: Secondary | ICD-10-CM | POA: Diagnosis not present

## 2019-12-28 DIAGNOSIS — R278 Other lack of coordination: Secondary | ICD-10-CM | POA: Diagnosis not present

## 2019-12-28 DIAGNOSIS — I69354 Hemiplegia and hemiparesis following cerebral infarction affecting left non-dominant side: Secondary | ICD-10-CM | POA: Diagnosis not present

## 2019-12-28 DIAGNOSIS — R41841 Cognitive communication deficit: Secondary | ICD-10-CM | POA: Diagnosis not present

## 2019-12-28 DIAGNOSIS — M6281 Muscle weakness (generalized): Secondary | ICD-10-CM | POA: Diagnosis not present

## 2019-12-28 DIAGNOSIS — I10 Essential (primary) hypertension: Secondary | ICD-10-CM | POA: Diagnosis not present

## 2019-12-28 DIAGNOSIS — I480 Paroxysmal atrial fibrillation: Secondary | ICD-10-CM | POA: Diagnosis not present

## 2019-12-28 DIAGNOSIS — I69054 Hemiplegia and hemiparesis following nontraumatic subarachnoid hemorrhage affecting left non-dominant side: Secondary | ICD-10-CM | POA: Diagnosis not present

## 2019-12-28 DIAGNOSIS — G8929 Other chronic pain: Secondary | ICD-10-CM | POA: Diagnosis not present

## 2019-12-28 DIAGNOSIS — R2689 Other abnormalities of gait and mobility: Secondary | ICD-10-CM | POA: Diagnosis not present

## 2019-12-28 DIAGNOSIS — D6489 Other specified anemias: Secondary | ICD-10-CM | POA: Diagnosis not present

## 2019-12-28 DIAGNOSIS — G8194 Hemiplegia, unspecified affecting left nondominant side: Secondary | ICD-10-CM | POA: Diagnosis not present

## 2019-12-28 DIAGNOSIS — I5032 Chronic diastolic (congestive) heart failure: Secondary | ICD-10-CM | POA: Diagnosis not present

## 2019-12-28 DIAGNOSIS — E119 Type 2 diabetes mellitus without complications: Secondary | ICD-10-CM | POA: Diagnosis not present

## 2019-12-28 DIAGNOSIS — I63511 Cerebral infarction due to unspecified occlusion or stenosis of right middle cerebral artery: Secondary | ICD-10-CM | POA: Diagnosis not present

## 2019-12-29 DIAGNOSIS — I69354 Hemiplegia and hemiparesis following cerebral infarction affecting left non-dominant side: Secondary | ICD-10-CM | POA: Diagnosis not present

## 2019-12-29 DIAGNOSIS — M6281 Muscle weakness (generalized): Secondary | ICD-10-CM | POA: Diagnosis not present

## 2019-12-29 DIAGNOSIS — I69054 Hemiplegia and hemiparesis following nontraumatic subarachnoid hemorrhage affecting left non-dominant side: Secondary | ICD-10-CM | POA: Diagnosis not present

## 2019-12-29 DIAGNOSIS — R278 Other lack of coordination: Secondary | ICD-10-CM | POA: Diagnosis not present

## 2019-12-29 DIAGNOSIS — R41841 Cognitive communication deficit: Secondary | ICD-10-CM | POA: Diagnosis not present

## 2019-12-29 DIAGNOSIS — R2689 Other abnormalities of gait and mobility: Secondary | ICD-10-CM | POA: Diagnosis not present

## 2019-12-30 DIAGNOSIS — M6281 Muscle weakness (generalized): Secondary | ICD-10-CM | POA: Diagnosis not present

## 2019-12-30 DIAGNOSIS — R41841 Cognitive communication deficit: Secondary | ICD-10-CM | POA: Diagnosis not present

## 2019-12-30 DIAGNOSIS — R278 Other lack of coordination: Secondary | ICD-10-CM | POA: Diagnosis not present

## 2019-12-30 DIAGNOSIS — R2689 Other abnormalities of gait and mobility: Secondary | ICD-10-CM | POA: Diagnosis not present

## 2019-12-30 DIAGNOSIS — I69354 Hemiplegia and hemiparesis following cerebral infarction affecting left non-dominant side: Secondary | ICD-10-CM | POA: Diagnosis not present

## 2019-12-30 DIAGNOSIS — I69054 Hemiplegia and hemiparesis following nontraumatic subarachnoid hemorrhage affecting left non-dominant side: Secondary | ICD-10-CM | POA: Diagnosis not present

## 2019-12-31 DIAGNOSIS — R278 Other lack of coordination: Secondary | ICD-10-CM | POA: Diagnosis not present

## 2019-12-31 DIAGNOSIS — R2689 Other abnormalities of gait and mobility: Secondary | ICD-10-CM | POA: Diagnosis not present

## 2019-12-31 DIAGNOSIS — I69354 Hemiplegia and hemiparesis following cerebral infarction affecting left non-dominant side: Secondary | ICD-10-CM | POA: Diagnosis not present

## 2019-12-31 DIAGNOSIS — M6281 Muscle weakness (generalized): Secondary | ICD-10-CM | POA: Diagnosis not present

## 2019-12-31 DIAGNOSIS — R41841 Cognitive communication deficit: Secondary | ICD-10-CM | POA: Diagnosis not present

## 2019-12-31 DIAGNOSIS — I69054 Hemiplegia and hemiparesis following nontraumatic subarachnoid hemorrhage affecting left non-dominant side: Secondary | ICD-10-CM | POA: Diagnosis not present

## 2020-01-01 DIAGNOSIS — R278 Other lack of coordination: Secondary | ICD-10-CM | POA: Diagnosis not present

## 2020-01-01 DIAGNOSIS — M6281 Muscle weakness (generalized): Secondary | ICD-10-CM | POA: Diagnosis not present

## 2020-01-01 DIAGNOSIS — I69054 Hemiplegia and hemiparesis following nontraumatic subarachnoid hemorrhage affecting left non-dominant side: Secondary | ICD-10-CM | POA: Diagnosis not present

## 2020-01-01 DIAGNOSIS — I69354 Hemiplegia and hemiparesis following cerebral infarction affecting left non-dominant side: Secondary | ICD-10-CM | POA: Diagnosis not present

## 2020-01-01 DIAGNOSIS — R41841 Cognitive communication deficit: Secondary | ICD-10-CM | POA: Diagnosis not present

## 2020-01-01 DIAGNOSIS — R2689 Other abnormalities of gait and mobility: Secondary | ICD-10-CM | POA: Diagnosis not present

## 2020-01-02 DIAGNOSIS — M6281 Muscle weakness (generalized): Secondary | ICD-10-CM | POA: Diagnosis not present

## 2020-01-02 DIAGNOSIS — R2689 Other abnormalities of gait and mobility: Secondary | ICD-10-CM | POA: Diagnosis not present

## 2020-01-02 DIAGNOSIS — I69054 Hemiplegia and hemiparesis following nontraumatic subarachnoid hemorrhage affecting left non-dominant side: Secondary | ICD-10-CM | POA: Diagnosis not present

## 2020-01-02 DIAGNOSIS — R278 Other lack of coordination: Secondary | ICD-10-CM | POA: Diagnosis not present

## 2020-01-02 DIAGNOSIS — R41841 Cognitive communication deficit: Secondary | ICD-10-CM | POA: Diagnosis not present

## 2020-01-02 DIAGNOSIS — I69354 Hemiplegia and hemiparesis following cerebral infarction affecting left non-dominant side: Secondary | ICD-10-CM | POA: Diagnosis not present

## 2020-01-03 DIAGNOSIS — R41841 Cognitive communication deficit: Secondary | ICD-10-CM | POA: Diagnosis not present

## 2020-01-03 DIAGNOSIS — R278 Other lack of coordination: Secondary | ICD-10-CM | POA: Diagnosis not present

## 2020-01-03 DIAGNOSIS — I69054 Hemiplegia and hemiparesis following nontraumatic subarachnoid hemorrhage affecting left non-dominant side: Secondary | ICD-10-CM | POA: Diagnosis not present

## 2020-01-03 DIAGNOSIS — R2689 Other abnormalities of gait and mobility: Secondary | ICD-10-CM | POA: Diagnosis not present

## 2020-01-03 DIAGNOSIS — M6281 Muscle weakness (generalized): Secondary | ICD-10-CM | POA: Diagnosis not present

## 2020-01-03 DIAGNOSIS — I69354 Hemiplegia and hemiparesis following cerebral infarction affecting left non-dominant side: Secondary | ICD-10-CM | POA: Diagnosis not present

## 2020-01-04 DIAGNOSIS — I69054 Hemiplegia and hemiparesis following nontraumatic subarachnoid hemorrhage affecting left non-dominant side: Secondary | ICD-10-CM | POA: Diagnosis not present

## 2020-01-04 DIAGNOSIS — R278 Other lack of coordination: Secondary | ICD-10-CM | POA: Diagnosis not present

## 2020-01-04 DIAGNOSIS — R41841 Cognitive communication deficit: Secondary | ICD-10-CM | POA: Diagnosis not present

## 2020-01-04 DIAGNOSIS — M6281 Muscle weakness (generalized): Secondary | ICD-10-CM | POA: Diagnosis not present

## 2020-01-04 DIAGNOSIS — I69354 Hemiplegia and hemiparesis following cerebral infarction affecting left non-dominant side: Secondary | ICD-10-CM | POA: Diagnosis not present

## 2020-01-04 DIAGNOSIS — R2689 Other abnormalities of gait and mobility: Secondary | ICD-10-CM | POA: Diagnosis not present

## 2020-01-05 DIAGNOSIS — R278 Other lack of coordination: Secondary | ICD-10-CM | POA: Diagnosis not present

## 2020-01-05 DIAGNOSIS — I69054 Hemiplegia and hemiparesis following nontraumatic subarachnoid hemorrhage affecting left non-dominant side: Secondary | ICD-10-CM | POA: Diagnosis not present

## 2020-01-05 DIAGNOSIS — I69354 Hemiplegia and hemiparesis following cerebral infarction affecting left non-dominant side: Secondary | ICD-10-CM | POA: Diagnosis not present

## 2020-01-05 DIAGNOSIS — R41841 Cognitive communication deficit: Secondary | ICD-10-CM | POA: Diagnosis not present

## 2020-01-05 DIAGNOSIS — M6281 Muscle weakness (generalized): Secondary | ICD-10-CM | POA: Diagnosis not present

## 2020-01-05 DIAGNOSIS — R2689 Other abnormalities of gait and mobility: Secondary | ICD-10-CM | POA: Diagnosis not present

## 2020-01-06 DIAGNOSIS — M6281 Muscle weakness (generalized): Secondary | ICD-10-CM | POA: Diagnosis not present

## 2020-01-06 DIAGNOSIS — R278 Other lack of coordination: Secondary | ICD-10-CM | POA: Diagnosis not present

## 2020-01-06 DIAGNOSIS — R41841 Cognitive communication deficit: Secondary | ICD-10-CM | POA: Diagnosis not present

## 2020-01-06 DIAGNOSIS — R2689 Other abnormalities of gait and mobility: Secondary | ICD-10-CM | POA: Diagnosis not present

## 2020-01-06 DIAGNOSIS — I69054 Hemiplegia and hemiparesis following nontraumatic subarachnoid hemorrhage affecting left non-dominant side: Secondary | ICD-10-CM | POA: Diagnosis not present

## 2020-01-06 DIAGNOSIS — I69354 Hemiplegia and hemiparesis following cerebral infarction affecting left non-dominant side: Secondary | ICD-10-CM | POA: Diagnosis not present

## 2020-01-07 DIAGNOSIS — R41841 Cognitive communication deficit: Secondary | ICD-10-CM | POA: Diagnosis not present

## 2020-01-07 DIAGNOSIS — R2689 Other abnormalities of gait and mobility: Secondary | ICD-10-CM | POA: Diagnosis not present

## 2020-01-07 DIAGNOSIS — M6281 Muscle weakness (generalized): Secondary | ICD-10-CM | POA: Diagnosis not present

## 2020-01-07 DIAGNOSIS — R278 Other lack of coordination: Secondary | ICD-10-CM | POA: Diagnosis not present

## 2020-01-07 DIAGNOSIS — I69054 Hemiplegia and hemiparesis following nontraumatic subarachnoid hemorrhage affecting left non-dominant side: Secondary | ICD-10-CM | POA: Diagnosis not present

## 2020-01-07 DIAGNOSIS — I69354 Hemiplegia and hemiparesis following cerebral infarction affecting left non-dominant side: Secondary | ICD-10-CM | POA: Diagnosis not present

## 2020-01-07 DIAGNOSIS — U071 COVID-19: Secondary | ICD-10-CM | POA: Diagnosis not present

## 2020-01-08 DIAGNOSIS — M6281 Muscle weakness (generalized): Secondary | ICD-10-CM | POA: Diagnosis not present

## 2020-01-08 DIAGNOSIS — R41841 Cognitive communication deficit: Secondary | ICD-10-CM | POA: Diagnosis not present

## 2020-01-08 DIAGNOSIS — R278 Other lack of coordination: Secondary | ICD-10-CM | POA: Diagnosis not present

## 2020-01-08 DIAGNOSIS — I69054 Hemiplegia and hemiparesis following nontraumatic subarachnoid hemorrhage affecting left non-dominant side: Secondary | ICD-10-CM | POA: Diagnosis not present

## 2020-01-08 DIAGNOSIS — R2689 Other abnormalities of gait and mobility: Secondary | ICD-10-CM | POA: Diagnosis not present

## 2020-01-08 DIAGNOSIS — I69354 Hemiplegia and hemiparesis following cerebral infarction affecting left non-dominant side: Secondary | ICD-10-CM | POA: Diagnosis not present

## 2020-01-09 DIAGNOSIS — I69054 Hemiplegia and hemiparesis following nontraumatic subarachnoid hemorrhage affecting left non-dominant side: Secondary | ICD-10-CM | POA: Diagnosis not present

## 2020-01-09 DIAGNOSIS — R41841 Cognitive communication deficit: Secondary | ICD-10-CM | POA: Diagnosis not present

## 2020-01-09 DIAGNOSIS — I69354 Hemiplegia and hemiparesis following cerebral infarction affecting left non-dominant side: Secondary | ICD-10-CM | POA: Diagnosis not present

## 2020-01-09 DIAGNOSIS — R2689 Other abnormalities of gait and mobility: Secondary | ICD-10-CM | POA: Diagnosis not present

## 2020-01-09 DIAGNOSIS — M6281 Muscle weakness (generalized): Secondary | ICD-10-CM | POA: Diagnosis not present

## 2020-01-09 DIAGNOSIS — R278 Other lack of coordination: Secondary | ICD-10-CM | POA: Diagnosis not present

## 2020-01-10 DIAGNOSIS — I69054 Hemiplegia and hemiparesis following nontraumatic subarachnoid hemorrhage affecting left non-dominant side: Secondary | ICD-10-CM | POA: Diagnosis not present

## 2020-01-10 DIAGNOSIS — I69354 Hemiplegia and hemiparesis following cerebral infarction affecting left non-dominant side: Secondary | ICD-10-CM | POA: Diagnosis not present

## 2020-01-10 DIAGNOSIS — R278 Other lack of coordination: Secondary | ICD-10-CM | POA: Diagnosis not present

## 2020-01-10 DIAGNOSIS — M6281 Muscle weakness (generalized): Secondary | ICD-10-CM | POA: Diagnosis not present

## 2020-01-10 DIAGNOSIS — R2689 Other abnormalities of gait and mobility: Secondary | ICD-10-CM | POA: Diagnosis not present

## 2020-01-10 DIAGNOSIS — R41841 Cognitive communication deficit: Secondary | ICD-10-CM | POA: Diagnosis not present

## 2020-01-11 DIAGNOSIS — R278 Other lack of coordination: Secondary | ICD-10-CM | POA: Diagnosis not present

## 2020-01-11 DIAGNOSIS — M6281 Muscle weakness (generalized): Secondary | ICD-10-CM | POA: Diagnosis not present

## 2020-01-11 DIAGNOSIS — R2689 Other abnormalities of gait and mobility: Secondary | ICD-10-CM | POA: Diagnosis not present

## 2020-01-11 DIAGNOSIS — B351 Tinea unguium: Secondary | ICD-10-CM | POA: Diagnosis not present

## 2020-01-11 DIAGNOSIS — I69054 Hemiplegia and hemiparesis following nontraumatic subarachnoid hemorrhage affecting left non-dominant side: Secondary | ICD-10-CM | POA: Diagnosis not present

## 2020-01-11 DIAGNOSIS — R41841 Cognitive communication deficit: Secondary | ICD-10-CM | POA: Diagnosis not present

## 2020-01-11 DIAGNOSIS — I69354 Hemiplegia and hemiparesis following cerebral infarction affecting left non-dominant side: Secondary | ICD-10-CM | POA: Diagnosis not present

## 2020-01-12 DIAGNOSIS — R278 Other lack of coordination: Secondary | ICD-10-CM | POA: Diagnosis not present

## 2020-01-12 DIAGNOSIS — R2689 Other abnormalities of gait and mobility: Secondary | ICD-10-CM | POA: Diagnosis not present

## 2020-01-12 DIAGNOSIS — R41841 Cognitive communication deficit: Secondary | ICD-10-CM | POA: Diagnosis not present

## 2020-01-12 DIAGNOSIS — M6281 Muscle weakness (generalized): Secondary | ICD-10-CM | POA: Diagnosis not present

## 2020-01-12 DIAGNOSIS — I69054 Hemiplegia and hemiparesis following nontraumatic subarachnoid hemorrhage affecting left non-dominant side: Secondary | ICD-10-CM | POA: Diagnosis not present

## 2020-01-12 DIAGNOSIS — I69354 Hemiplegia and hemiparesis following cerebral infarction affecting left non-dominant side: Secondary | ICD-10-CM | POA: Diagnosis not present

## 2020-01-13 DIAGNOSIS — U071 COVID-19: Secondary | ICD-10-CM | POA: Diagnosis not present

## 2020-01-13 DIAGNOSIS — R278 Other lack of coordination: Secondary | ICD-10-CM | POA: Diagnosis not present

## 2020-01-13 DIAGNOSIS — R2689 Other abnormalities of gait and mobility: Secondary | ICD-10-CM | POA: Diagnosis not present

## 2020-01-13 DIAGNOSIS — I69354 Hemiplegia and hemiparesis following cerebral infarction affecting left non-dominant side: Secondary | ICD-10-CM | POA: Diagnosis not present

## 2020-01-13 DIAGNOSIS — M6281 Muscle weakness (generalized): Secondary | ICD-10-CM | POA: Diagnosis not present

## 2020-01-13 DIAGNOSIS — I69054 Hemiplegia and hemiparesis following nontraumatic subarachnoid hemorrhage affecting left non-dominant side: Secondary | ICD-10-CM | POA: Diagnosis not present

## 2020-01-13 DIAGNOSIS — R41841 Cognitive communication deficit: Secondary | ICD-10-CM | POA: Diagnosis not present

## 2020-01-14 DIAGNOSIS — R278 Other lack of coordination: Secondary | ICD-10-CM | POA: Diagnosis not present

## 2020-01-14 DIAGNOSIS — M6281 Muscle weakness (generalized): Secondary | ICD-10-CM | POA: Diagnosis not present

## 2020-01-14 DIAGNOSIS — I69054 Hemiplegia and hemiparesis following nontraumatic subarachnoid hemorrhage affecting left non-dominant side: Secondary | ICD-10-CM | POA: Diagnosis not present

## 2020-01-14 DIAGNOSIS — I69354 Hemiplegia and hemiparesis following cerebral infarction affecting left non-dominant side: Secondary | ICD-10-CM | POA: Diagnosis not present

## 2020-01-14 DIAGNOSIS — R41841 Cognitive communication deficit: Secondary | ICD-10-CM | POA: Diagnosis not present

## 2020-01-14 DIAGNOSIS — R2689 Other abnormalities of gait and mobility: Secondary | ICD-10-CM | POA: Diagnosis not present

## 2020-01-15 DIAGNOSIS — M6281 Muscle weakness (generalized): Secondary | ICD-10-CM | POA: Diagnosis not present

## 2020-01-15 DIAGNOSIS — R2689 Other abnormalities of gait and mobility: Secondary | ICD-10-CM | POA: Diagnosis not present

## 2020-01-15 DIAGNOSIS — R41841 Cognitive communication deficit: Secondary | ICD-10-CM | POA: Diagnosis not present

## 2020-01-15 DIAGNOSIS — I69054 Hemiplegia and hemiparesis following nontraumatic subarachnoid hemorrhage affecting left non-dominant side: Secondary | ICD-10-CM | POA: Diagnosis not present

## 2020-01-15 DIAGNOSIS — I69354 Hemiplegia and hemiparesis following cerebral infarction affecting left non-dominant side: Secondary | ICD-10-CM | POA: Diagnosis not present

## 2020-01-15 DIAGNOSIS — R278 Other lack of coordination: Secondary | ICD-10-CM | POA: Diagnosis not present

## 2020-01-16 DIAGNOSIS — I69354 Hemiplegia and hemiparesis following cerebral infarction affecting left non-dominant side: Secondary | ICD-10-CM | POA: Diagnosis not present

## 2020-01-16 DIAGNOSIS — R2689 Other abnormalities of gait and mobility: Secondary | ICD-10-CM | POA: Diagnosis not present

## 2020-01-16 DIAGNOSIS — R41841 Cognitive communication deficit: Secondary | ICD-10-CM | POA: Diagnosis not present

## 2020-01-16 DIAGNOSIS — R278 Other lack of coordination: Secondary | ICD-10-CM | POA: Diagnosis not present

## 2020-01-16 DIAGNOSIS — I69054 Hemiplegia and hemiparesis following nontraumatic subarachnoid hemorrhage affecting left non-dominant side: Secondary | ICD-10-CM | POA: Diagnosis not present

## 2020-01-16 DIAGNOSIS — M6281 Muscle weakness (generalized): Secondary | ICD-10-CM | POA: Diagnosis not present

## 2020-01-17 DIAGNOSIS — I69354 Hemiplegia and hemiparesis following cerebral infarction affecting left non-dominant side: Secondary | ICD-10-CM | POA: Diagnosis not present

## 2020-01-17 DIAGNOSIS — I69054 Hemiplegia and hemiparesis following nontraumatic subarachnoid hemorrhage affecting left non-dominant side: Secondary | ICD-10-CM | POA: Diagnosis not present

## 2020-01-17 DIAGNOSIS — R41841 Cognitive communication deficit: Secondary | ICD-10-CM | POA: Diagnosis not present

## 2020-01-17 DIAGNOSIS — R2689 Other abnormalities of gait and mobility: Secondary | ICD-10-CM | POA: Diagnosis not present

## 2020-01-17 DIAGNOSIS — R278 Other lack of coordination: Secondary | ICD-10-CM | POA: Diagnosis not present

## 2020-01-17 DIAGNOSIS — M6281 Muscle weakness (generalized): Secondary | ICD-10-CM | POA: Diagnosis not present

## 2020-01-18 DIAGNOSIS — M6281 Muscle weakness (generalized): Secondary | ICD-10-CM | POA: Diagnosis not present

## 2020-01-18 DIAGNOSIS — I69054 Hemiplegia and hemiparesis following nontraumatic subarachnoid hemorrhage affecting left non-dominant side: Secondary | ICD-10-CM | POA: Diagnosis not present

## 2020-01-18 DIAGNOSIS — R278 Other lack of coordination: Secondary | ICD-10-CM | POA: Diagnosis not present

## 2020-01-18 DIAGNOSIS — R41841 Cognitive communication deficit: Secondary | ICD-10-CM | POA: Diagnosis not present

## 2020-01-18 DIAGNOSIS — R2689 Other abnormalities of gait and mobility: Secondary | ICD-10-CM | POA: Diagnosis not present

## 2020-01-18 DIAGNOSIS — I69354 Hemiplegia and hemiparesis following cerebral infarction affecting left non-dominant side: Secondary | ICD-10-CM | POA: Diagnosis not present

## 2020-01-19 DIAGNOSIS — I69354 Hemiplegia and hemiparesis following cerebral infarction affecting left non-dominant side: Secondary | ICD-10-CM | POA: Diagnosis not present

## 2020-01-19 DIAGNOSIS — M6281 Muscle weakness (generalized): Secondary | ICD-10-CM | POA: Diagnosis not present

## 2020-01-19 DIAGNOSIS — I69054 Hemiplegia and hemiparesis following nontraumatic subarachnoid hemorrhage affecting left non-dominant side: Secondary | ICD-10-CM | POA: Diagnosis not present

## 2020-01-19 DIAGNOSIS — R41841 Cognitive communication deficit: Secondary | ICD-10-CM | POA: Diagnosis not present

## 2020-01-19 DIAGNOSIS — R2689 Other abnormalities of gait and mobility: Secondary | ICD-10-CM | POA: Diagnosis not present

## 2020-01-19 DIAGNOSIS — R278 Other lack of coordination: Secondary | ICD-10-CM | POA: Diagnosis not present

## 2020-01-20 DIAGNOSIS — R2689 Other abnormalities of gait and mobility: Secondary | ICD-10-CM | POA: Diagnosis not present

## 2020-01-20 DIAGNOSIS — I69354 Hemiplegia and hemiparesis following cerebral infarction affecting left non-dominant side: Secondary | ICD-10-CM | POA: Diagnosis not present

## 2020-01-20 DIAGNOSIS — R41841 Cognitive communication deficit: Secondary | ICD-10-CM | POA: Diagnosis not present

## 2020-01-20 DIAGNOSIS — R278 Other lack of coordination: Secondary | ICD-10-CM | POA: Diagnosis not present

## 2020-01-20 DIAGNOSIS — M6281 Muscle weakness (generalized): Secondary | ICD-10-CM | POA: Diagnosis not present

## 2020-01-20 DIAGNOSIS — U071 COVID-19: Secondary | ICD-10-CM | POA: Diagnosis not present

## 2020-01-20 DIAGNOSIS — I69054 Hemiplegia and hemiparesis following nontraumatic subarachnoid hemorrhage affecting left non-dominant side: Secondary | ICD-10-CM | POA: Diagnosis not present

## 2020-01-21 DIAGNOSIS — R41841 Cognitive communication deficit: Secondary | ICD-10-CM | POA: Diagnosis not present

## 2020-01-21 DIAGNOSIS — I69354 Hemiplegia and hemiparesis following cerebral infarction affecting left non-dominant side: Secondary | ICD-10-CM | POA: Diagnosis not present

## 2020-01-21 DIAGNOSIS — R278 Other lack of coordination: Secondary | ICD-10-CM | POA: Diagnosis not present

## 2020-01-21 DIAGNOSIS — M6281 Muscle weakness (generalized): Secondary | ICD-10-CM | POA: Diagnosis not present

## 2020-01-21 DIAGNOSIS — M79672 Pain in left foot: Secondary | ICD-10-CM | POA: Diagnosis not present

## 2020-01-21 DIAGNOSIS — I69054 Hemiplegia and hemiparesis following nontraumatic subarachnoid hemorrhage affecting left non-dominant side: Secondary | ICD-10-CM | POA: Diagnosis not present

## 2020-01-21 DIAGNOSIS — R2689 Other abnormalities of gait and mobility: Secondary | ICD-10-CM | POA: Diagnosis not present

## 2020-01-21 DIAGNOSIS — L600 Ingrowing nail: Secondary | ICD-10-CM | POA: Diagnosis not present

## 2020-01-23 DIAGNOSIS — I69354 Hemiplegia and hemiparesis following cerebral infarction affecting left non-dominant side: Secondary | ICD-10-CM | POA: Diagnosis not present

## 2020-01-23 DIAGNOSIS — R2689 Other abnormalities of gait and mobility: Secondary | ICD-10-CM | POA: Diagnosis not present

## 2020-01-23 DIAGNOSIS — R41841 Cognitive communication deficit: Secondary | ICD-10-CM | POA: Diagnosis not present

## 2020-01-23 DIAGNOSIS — I69054 Hemiplegia and hemiparesis following nontraumatic subarachnoid hemorrhage affecting left non-dominant side: Secondary | ICD-10-CM | POA: Diagnosis not present

## 2020-01-23 DIAGNOSIS — M6281 Muscle weakness (generalized): Secondary | ICD-10-CM | POA: Diagnosis not present

## 2020-01-23 DIAGNOSIS — R278 Other lack of coordination: Secondary | ICD-10-CM | POA: Diagnosis not present

## 2020-01-24 DIAGNOSIS — R2689 Other abnormalities of gait and mobility: Secondary | ICD-10-CM | POA: Diagnosis not present

## 2020-01-24 DIAGNOSIS — R1312 Dysphagia, oropharyngeal phase: Secondary | ICD-10-CM | POA: Diagnosis not present

## 2020-01-24 DIAGNOSIS — R4701 Aphasia: Secondary | ICD-10-CM | POA: Diagnosis not present

## 2020-01-24 DIAGNOSIS — E1159 Type 2 diabetes mellitus with other circulatory complications: Secondary | ICD-10-CM | POA: Diagnosis not present

## 2020-01-24 DIAGNOSIS — I69054 Hemiplegia and hemiparesis following nontraumatic subarachnoid hemorrhage affecting left non-dominant side: Secondary | ICD-10-CM | POA: Diagnosis not present

## 2020-01-24 DIAGNOSIS — I6330 Cerebral infarction due to thrombosis of unspecified cerebral artery: Secondary | ICD-10-CM | POA: Diagnosis not present

## 2020-01-24 DIAGNOSIS — R278 Other lack of coordination: Secondary | ICD-10-CM | POA: Diagnosis not present

## 2020-01-24 DIAGNOSIS — R41841 Cognitive communication deficit: Secondary | ICD-10-CM | POA: Diagnosis not present

## 2020-01-24 DIAGNOSIS — M6281 Muscle weakness (generalized): Secondary | ICD-10-CM | POA: Diagnosis not present

## 2020-01-24 DIAGNOSIS — E440 Moderate protein-calorie malnutrition: Secondary | ICD-10-CM | POA: Diagnosis not present

## 2020-01-24 DIAGNOSIS — I69354 Hemiplegia and hemiparesis following cerebral infarction affecting left non-dominant side: Secondary | ICD-10-CM | POA: Diagnosis not present

## 2020-01-25 DIAGNOSIS — I69054 Hemiplegia and hemiparesis following nontraumatic subarachnoid hemorrhage affecting left non-dominant side: Secondary | ICD-10-CM | POA: Diagnosis not present

## 2020-01-25 DIAGNOSIS — R1312 Dysphagia, oropharyngeal phase: Secondary | ICD-10-CM | POA: Diagnosis not present

## 2020-01-25 DIAGNOSIS — I69354 Hemiplegia and hemiparesis following cerebral infarction affecting left non-dominant side: Secondary | ICD-10-CM | POA: Diagnosis not present

## 2020-01-25 DIAGNOSIS — I6330 Cerebral infarction due to thrombosis of unspecified cerebral artery: Secondary | ICD-10-CM | POA: Diagnosis not present

## 2020-01-25 DIAGNOSIS — M6281 Muscle weakness (generalized): Secondary | ICD-10-CM | POA: Diagnosis not present

## 2020-01-25 DIAGNOSIS — R2689 Other abnormalities of gait and mobility: Secondary | ICD-10-CM | POA: Diagnosis not present

## 2020-01-26 DIAGNOSIS — M6281 Muscle weakness (generalized): Secondary | ICD-10-CM | POA: Diagnosis not present

## 2020-01-26 DIAGNOSIS — I69354 Hemiplegia and hemiparesis following cerebral infarction affecting left non-dominant side: Secondary | ICD-10-CM | POA: Diagnosis not present

## 2020-01-26 DIAGNOSIS — I6330 Cerebral infarction due to thrombosis of unspecified cerebral artery: Secondary | ICD-10-CM | POA: Diagnosis not present

## 2020-01-26 DIAGNOSIS — R2689 Other abnormalities of gait and mobility: Secondary | ICD-10-CM | POA: Diagnosis not present

## 2020-01-26 DIAGNOSIS — I69054 Hemiplegia and hemiparesis following nontraumatic subarachnoid hemorrhage affecting left non-dominant side: Secondary | ICD-10-CM | POA: Diagnosis not present

## 2020-01-26 DIAGNOSIS — R1312 Dysphagia, oropharyngeal phase: Secondary | ICD-10-CM | POA: Diagnosis not present

## 2020-01-27 DIAGNOSIS — M6281 Muscle weakness (generalized): Secondary | ICD-10-CM | POA: Diagnosis not present

## 2020-01-27 DIAGNOSIS — I69054 Hemiplegia and hemiparesis following nontraumatic subarachnoid hemorrhage affecting left non-dominant side: Secondary | ICD-10-CM | POA: Diagnosis not present

## 2020-01-27 DIAGNOSIS — I6330 Cerebral infarction due to thrombosis of unspecified cerebral artery: Secondary | ICD-10-CM | POA: Diagnosis not present

## 2020-01-27 DIAGNOSIS — I69354 Hemiplegia and hemiparesis following cerebral infarction affecting left non-dominant side: Secondary | ICD-10-CM | POA: Diagnosis not present

## 2020-01-27 DIAGNOSIS — R1312 Dysphagia, oropharyngeal phase: Secondary | ICD-10-CM | POA: Diagnosis not present

## 2020-01-27 DIAGNOSIS — R2689 Other abnormalities of gait and mobility: Secondary | ICD-10-CM | POA: Diagnosis not present

## 2020-01-27 DIAGNOSIS — U071 COVID-19: Secondary | ICD-10-CM | POA: Diagnosis not present

## 2020-01-28 DIAGNOSIS — I69054 Hemiplegia and hemiparesis following nontraumatic subarachnoid hemorrhage affecting left non-dominant side: Secondary | ICD-10-CM | POA: Diagnosis not present

## 2020-01-28 DIAGNOSIS — R1312 Dysphagia, oropharyngeal phase: Secondary | ICD-10-CM | POA: Diagnosis not present

## 2020-01-28 DIAGNOSIS — I69354 Hemiplegia and hemiparesis following cerebral infarction affecting left non-dominant side: Secondary | ICD-10-CM | POA: Diagnosis not present

## 2020-01-28 DIAGNOSIS — M6281 Muscle weakness (generalized): Secondary | ICD-10-CM | POA: Diagnosis not present

## 2020-01-28 DIAGNOSIS — I6330 Cerebral infarction due to thrombosis of unspecified cerebral artery: Secondary | ICD-10-CM | POA: Diagnosis not present

## 2020-01-28 DIAGNOSIS — R2689 Other abnormalities of gait and mobility: Secondary | ICD-10-CM | POA: Diagnosis not present

## 2020-01-29 DIAGNOSIS — I69354 Hemiplegia and hemiparesis following cerebral infarction affecting left non-dominant side: Secondary | ICD-10-CM | POA: Diagnosis not present

## 2020-01-29 DIAGNOSIS — R1312 Dysphagia, oropharyngeal phase: Secondary | ICD-10-CM | POA: Diagnosis not present

## 2020-01-29 DIAGNOSIS — R2689 Other abnormalities of gait and mobility: Secondary | ICD-10-CM | POA: Diagnosis not present

## 2020-01-29 DIAGNOSIS — I69054 Hemiplegia and hemiparesis following nontraumatic subarachnoid hemorrhage affecting left non-dominant side: Secondary | ICD-10-CM | POA: Diagnosis not present

## 2020-01-29 DIAGNOSIS — I6330 Cerebral infarction due to thrombosis of unspecified cerebral artery: Secondary | ICD-10-CM | POA: Diagnosis not present

## 2020-01-29 DIAGNOSIS — M6281 Muscle weakness (generalized): Secondary | ICD-10-CM | POA: Diagnosis not present

## 2020-01-30 DIAGNOSIS — M6281 Muscle weakness (generalized): Secondary | ICD-10-CM | POA: Diagnosis not present

## 2020-01-30 DIAGNOSIS — I69054 Hemiplegia and hemiparesis following nontraumatic subarachnoid hemorrhage affecting left non-dominant side: Secondary | ICD-10-CM | POA: Diagnosis not present

## 2020-01-30 DIAGNOSIS — I69354 Hemiplegia and hemiparesis following cerebral infarction affecting left non-dominant side: Secondary | ICD-10-CM | POA: Diagnosis not present

## 2020-01-30 DIAGNOSIS — I6330 Cerebral infarction due to thrombosis of unspecified cerebral artery: Secondary | ICD-10-CM | POA: Diagnosis not present

## 2020-01-30 DIAGNOSIS — R1312 Dysphagia, oropharyngeal phase: Secondary | ICD-10-CM | POA: Diagnosis not present

## 2020-01-30 DIAGNOSIS — R2689 Other abnormalities of gait and mobility: Secondary | ICD-10-CM | POA: Diagnosis not present

## 2020-01-31 DIAGNOSIS — R2689 Other abnormalities of gait and mobility: Secondary | ICD-10-CM | POA: Diagnosis not present

## 2020-01-31 DIAGNOSIS — M6281 Muscle weakness (generalized): Secondary | ICD-10-CM | POA: Diagnosis not present

## 2020-01-31 DIAGNOSIS — I69054 Hemiplegia and hemiparesis following nontraumatic subarachnoid hemorrhage affecting left non-dominant side: Secondary | ICD-10-CM | POA: Diagnosis not present

## 2020-01-31 DIAGNOSIS — I69354 Hemiplegia and hemiparesis following cerebral infarction affecting left non-dominant side: Secondary | ICD-10-CM | POA: Diagnosis not present

## 2020-01-31 DIAGNOSIS — I6330 Cerebral infarction due to thrombosis of unspecified cerebral artery: Secondary | ICD-10-CM | POA: Diagnosis not present

## 2020-01-31 DIAGNOSIS — R1312 Dysphagia, oropharyngeal phase: Secondary | ICD-10-CM | POA: Diagnosis not present

## 2020-02-01 DIAGNOSIS — R1312 Dysphagia, oropharyngeal phase: Secondary | ICD-10-CM | POA: Diagnosis not present

## 2020-02-01 DIAGNOSIS — R2689 Other abnormalities of gait and mobility: Secondary | ICD-10-CM | POA: Diagnosis not present

## 2020-02-01 DIAGNOSIS — I69354 Hemiplegia and hemiparesis following cerebral infarction affecting left non-dominant side: Secondary | ICD-10-CM | POA: Diagnosis not present

## 2020-02-01 DIAGNOSIS — I69054 Hemiplegia and hemiparesis following nontraumatic subarachnoid hemorrhage affecting left non-dominant side: Secondary | ICD-10-CM | POA: Diagnosis not present

## 2020-02-01 DIAGNOSIS — I6330 Cerebral infarction due to thrombosis of unspecified cerebral artery: Secondary | ICD-10-CM | POA: Diagnosis not present

## 2020-02-01 DIAGNOSIS — M6281 Muscle weakness (generalized): Secondary | ICD-10-CM | POA: Diagnosis not present

## 2020-02-02 DIAGNOSIS — I6330 Cerebral infarction due to thrombosis of unspecified cerebral artery: Secondary | ICD-10-CM | POA: Diagnosis not present

## 2020-02-02 DIAGNOSIS — I69054 Hemiplegia and hemiparesis following nontraumatic subarachnoid hemorrhage affecting left non-dominant side: Secondary | ICD-10-CM | POA: Diagnosis not present

## 2020-02-02 DIAGNOSIS — M6281 Muscle weakness (generalized): Secondary | ICD-10-CM | POA: Diagnosis not present

## 2020-02-02 DIAGNOSIS — R2689 Other abnormalities of gait and mobility: Secondary | ICD-10-CM | POA: Diagnosis not present

## 2020-02-02 DIAGNOSIS — I69354 Hemiplegia and hemiparesis following cerebral infarction affecting left non-dominant side: Secondary | ICD-10-CM | POA: Diagnosis not present

## 2020-02-02 DIAGNOSIS — R1312 Dysphagia, oropharyngeal phase: Secondary | ICD-10-CM | POA: Diagnosis not present

## 2020-02-03 DIAGNOSIS — R1312 Dysphagia, oropharyngeal phase: Secondary | ICD-10-CM | POA: Diagnosis not present

## 2020-02-03 DIAGNOSIS — I69054 Hemiplegia and hemiparesis following nontraumatic subarachnoid hemorrhage affecting left non-dominant side: Secondary | ICD-10-CM | POA: Diagnosis not present

## 2020-02-03 DIAGNOSIS — I69354 Hemiplegia and hemiparesis following cerebral infarction affecting left non-dominant side: Secondary | ICD-10-CM | POA: Diagnosis not present

## 2020-02-03 DIAGNOSIS — I6330 Cerebral infarction due to thrombosis of unspecified cerebral artery: Secondary | ICD-10-CM | POA: Diagnosis not present

## 2020-02-03 DIAGNOSIS — M6281 Muscle weakness (generalized): Secondary | ICD-10-CM | POA: Diagnosis not present

## 2020-02-03 DIAGNOSIS — R2689 Other abnormalities of gait and mobility: Secondary | ICD-10-CM | POA: Diagnosis not present

## 2020-02-03 NOTE — Telephone Encounter (Signed)
completed

## 2020-02-04 DIAGNOSIS — R2689 Other abnormalities of gait and mobility: Secondary | ICD-10-CM | POA: Diagnosis not present

## 2020-02-04 DIAGNOSIS — M6281 Muscle weakness (generalized): Secondary | ICD-10-CM | POA: Diagnosis not present

## 2020-02-04 DIAGNOSIS — I69054 Hemiplegia and hemiparesis following nontraumatic subarachnoid hemorrhage affecting left non-dominant side: Secondary | ICD-10-CM | POA: Diagnosis not present

## 2020-02-04 DIAGNOSIS — I69354 Hemiplegia and hemiparesis following cerebral infarction affecting left non-dominant side: Secondary | ICD-10-CM | POA: Diagnosis not present

## 2020-02-04 DIAGNOSIS — U071 COVID-19: Secondary | ICD-10-CM | POA: Diagnosis not present

## 2020-02-04 DIAGNOSIS — I6330 Cerebral infarction due to thrombosis of unspecified cerebral artery: Secondary | ICD-10-CM | POA: Diagnosis not present

## 2020-02-04 DIAGNOSIS — R1312 Dysphagia, oropharyngeal phase: Secondary | ICD-10-CM | POA: Diagnosis not present

## 2020-02-05 DIAGNOSIS — R2689 Other abnormalities of gait and mobility: Secondary | ICD-10-CM | POA: Diagnosis not present

## 2020-02-05 DIAGNOSIS — I69354 Hemiplegia and hemiparesis following cerebral infarction affecting left non-dominant side: Secondary | ICD-10-CM | POA: Diagnosis not present

## 2020-02-05 DIAGNOSIS — R1312 Dysphagia, oropharyngeal phase: Secondary | ICD-10-CM | POA: Diagnosis not present

## 2020-02-05 DIAGNOSIS — I69054 Hemiplegia and hemiparesis following nontraumatic subarachnoid hemorrhage affecting left non-dominant side: Secondary | ICD-10-CM | POA: Diagnosis not present

## 2020-02-05 DIAGNOSIS — M6281 Muscle weakness (generalized): Secondary | ICD-10-CM | POA: Diagnosis not present

## 2020-02-05 DIAGNOSIS — I6330 Cerebral infarction due to thrombosis of unspecified cerebral artery: Secondary | ICD-10-CM | POA: Diagnosis not present

## 2020-02-07 DIAGNOSIS — I69054 Hemiplegia and hemiparesis following nontraumatic subarachnoid hemorrhage affecting left non-dominant side: Secondary | ICD-10-CM | POA: Diagnosis not present

## 2020-02-07 DIAGNOSIS — R1312 Dysphagia, oropharyngeal phase: Secondary | ICD-10-CM | POA: Diagnosis not present

## 2020-02-07 DIAGNOSIS — I6330 Cerebral infarction due to thrombosis of unspecified cerebral artery: Secondary | ICD-10-CM | POA: Diagnosis not present

## 2020-02-07 DIAGNOSIS — I69354 Hemiplegia and hemiparesis following cerebral infarction affecting left non-dominant side: Secondary | ICD-10-CM | POA: Diagnosis not present

## 2020-02-07 DIAGNOSIS — M6281 Muscle weakness (generalized): Secondary | ICD-10-CM | POA: Diagnosis not present

## 2020-02-07 DIAGNOSIS — R2689 Other abnormalities of gait and mobility: Secondary | ICD-10-CM | POA: Diagnosis not present

## 2020-02-08 DIAGNOSIS — R1312 Dysphagia, oropharyngeal phase: Secondary | ICD-10-CM | POA: Diagnosis not present

## 2020-02-08 DIAGNOSIS — I6330 Cerebral infarction due to thrombosis of unspecified cerebral artery: Secondary | ICD-10-CM | POA: Diagnosis not present

## 2020-02-08 DIAGNOSIS — I69354 Hemiplegia and hemiparesis following cerebral infarction affecting left non-dominant side: Secondary | ICD-10-CM | POA: Diagnosis not present

## 2020-02-08 DIAGNOSIS — R2689 Other abnormalities of gait and mobility: Secondary | ICD-10-CM | POA: Diagnosis not present

## 2020-02-08 DIAGNOSIS — M6281 Muscle weakness (generalized): Secondary | ICD-10-CM | POA: Diagnosis not present

## 2020-02-08 DIAGNOSIS — I69054 Hemiplegia and hemiparesis following nontraumatic subarachnoid hemorrhage affecting left non-dominant side: Secondary | ICD-10-CM | POA: Diagnosis not present

## 2020-02-09 DIAGNOSIS — R2689 Other abnormalities of gait and mobility: Secondary | ICD-10-CM | POA: Diagnosis not present

## 2020-02-09 DIAGNOSIS — I69354 Hemiplegia and hemiparesis following cerebral infarction affecting left non-dominant side: Secondary | ICD-10-CM | POA: Diagnosis not present

## 2020-02-09 DIAGNOSIS — M6281 Muscle weakness (generalized): Secondary | ICD-10-CM | POA: Diagnosis not present

## 2020-02-09 DIAGNOSIS — I6330 Cerebral infarction due to thrombosis of unspecified cerebral artery: Secondary | ICD-10-CM | POA: Diagnosis not present

## 2020-02-09 DIAGNOSIS — R1312 Dysphagia, oropharyngeal phase: Secondary | ICD-10-CM | POA: Diagnosis not present

## 2020-02-09 DIAGNOSIS — I69054 Hemiplegia and hemiparesis following nontraumatic subarachnoid hemorrhage affecting left non-dominant side: Secondary | ICD-10-CM | POA: Diagnosis not present

## 2020-02-10 DIAGNOSIS — I69354 Hemiplegia and hemiparesis following cerebral infarction affecting left non-dominant side: Secondary | ICD-10-CM | POA: Diagnosis not present

## 2020-02-10 DIAGNOSIS — R1312 Dysphagia, oropharyngeal phase: Secondary | ICD-10-CM | POA: Diagnosis not present

## 2020-02-10 DIAGNOSIS — I69054 Hemiplegia and hemiparesis following nontraumatic subarachnoid hemorrhage affecting left non-dominant side: Secondary | ICD-10-CM | POA: Diagnosis not present

## 2020-02-10 DIAGNOSIS — R2689 Other abnormalities of gait and mobility: Secondary | ICD-10-CM | POA: Diagnosis not present

## 2020-02-10 DIAGNOSIS — M6281 Muscle weakness (generalized): Secondary | ICD-10-CM | POA: Diagnosis not present

## 2020-02-10 DIAGNOSIS — I6330 Cerebral infarction due to thrombosis of unspecified cerebral artery: Secondary | ICD-10-CM | POA: Diagnosis not present

## 2020-02-11 DIAGNOSIS — I69318 Other symptoms and signs involving cognitive functions following cerebral infarction: Secondary | ICD-10-CM | POA: Diagnosis not present

## 2020-02-11 DIAGNOSIS — R300 Dysuria: Secondary | ICD-10-CM | POA: Diagnosis not present

## 2020-02-11 DIAGNOSIS — R2689 Other abnormalities of gait and mobility: Secondary | ICD-10-CM | POA: Diagnosis not present

## 2020-02-11 DIAGNOSIS — I69054 Hemiplegia and hemiparesis following nontraumatic subarachnoid hemorrhage affecting left non-dominant side: Secondary | ICD-10-CM | POA: Diagnosis not present

## 2020-02-11 DIAGNOSIS — I6330 Cerebral infarction due to thrombosis of unspecified cerebral artery: Secondary | ICD-10-CM | POA: Diagnosis not present

## 2020-02-11 DIAGNOSIS — M6281 Muscle weakness (generalized): Secondary | ICD-10-CM | POA: Diagnosis not present

## 2020-02-11 DIAGNOSIS — I69354 Hemiplegia and hemiparesis following cerebral infarction affecting left non-dominant side: Secondary | ICD-10-CM | POA: Diagnosis not present

## 2020-02-11 DIAGNOSIS — M25512 Pain in left shoulder: Secondary | ICD-10-CM | POA: Diagnosis not present

## 2020-02-11 DIAGNOSIS — I480 Paroxysmal atrial fibrillation: Secondary | ICD-10-CM | POA: Diagnosis not present

## 2020-02-11 DIAGNOSIS — R1312 Dysphagia, oropharyngeal phase: Secondary | ICD-10-CM | POA: Diagnosis not present

## 2020-02-11 DIAGNOSIS — R4189 Other symptoms and signs involving cognitive functions and awareness: Secondary | ICD-10-CM | POA: Diagnosis not present

## 2020-02-11 DIAGNOSIS — U071 COVID-19: Secondary | ICD-10-CM | POA: Diagnosis not present

## 2020-02-11 DIAGNOSIS — E119 Type 2 diabetes mellitus without complications: Secondary | ICD-10-CM | POA: Diagnosis not present

## 2020-02-11 DIAGNOSIS — Z905 Acquired absence of kidney: Secondary | ICD-10-CM | POA: Diagnosis not present

## 2020-02-12 DIAGNOSIS — R7989 Other specified abnormal findings of blood chemistry: Secondary | ICD-10-CM | POA: Diagnosis not present

## 2020-02-12 DIAGNOSIS — R1312 Dysphagia, oropharyngeal phase: Secondary | ICD-10-CM | POA: Diagnosis not present

## 2020-02-12 DIAGNOSIS — N390 Urinary tract infection, site not specified: Secondary | ICD-10-CM | POA: Diagnosis not present

## 2020-02-12 DIAGNOSIS — R2689 Other abnormalities of gait and mobility: Secondary | ICD-10-CM | POA: Diagnosis not present

## 2020-02-12 DIAGNOSIS — M6281 Muscle weakness (generalized): Secondary | ICD-10-CM | POA: Diagnosis not present

## 2020-02-12 DIAGNOSIS — I69354 Hemiplegia and hemiparesis following cerebral infarction affecting left non-dominant side: Secondary | ICD-10-CM | POA: Diagnosis not present

## 2020-02-12 DIAGNOSIS — I69054 Hemiplegia and hemiparesis following nontraumatic subarachnoid hemorrhage affecting left non-dominant side: Secondary | ICD-10-CM | POA: Diagnosis not present

## 2020-02-12 DIAGNOSIS — R410 Disorientation, unspecified: Secondary | ICD-10-CM | POA: Diagnosis not present

## 2020-02-12 DIAGNOSIS — I6330 Cerebral infarction due to thrombosis of unspecified cerebral artery: Secondary | ICD-10-CM | POA: Diagnosis not present

## 2020-02-12 DIAGNOSIS — E1159 Type 2 diabetes mellitus with other circulatory complications: Secondary | ICD-10-CM | POA: Diagnosis not present

## 2020-02-12 DIAGNOSIS — Z79899 Other long term (current) drug therapy: Secondary | ICD-10-CM | POA: Diagnosis not present

## 2020-02-13 DIAGNOSIS — I69354 Hemiplegia and hemiparesis following cerebral infarction affecting left non-dominant side: Secondary | ICD-10-CM | POA: Diagnosis not present

## 2020-02-13 DIAGNOSIS — M6281 Muscle weakness (generalized): Secondary | ICD-10-CM | POA: Diagnosis not present

## 2020-02-13 DIAGNOSIS — I69054 Hemiplegia and hemiparesis following nontraumatic subarachnoid hemorrhage affecting left non-dominant side: Secondary | ICD-10-CM | POA: Diagnosis not present

## 2020-02-13 DIAGNOSIS — I6330 Cerebral infarction due to thrombosis of unspecified cerebral artery: Secondary | ICD-10-CM | POA: Diagnosis not present

## 2020-02-13 DIAGNOSIS — R2689 Other abnormalities of gait and mobility: Secondary | ICD-10-CM | POA: Diagnosis not present

## 2020-02-13 DIAGNOSIS — R1312 Dysphagia, oropharyngeal phase: Secondary | ICD-10-CM | POA: Diagnosis not present

## 2020-02-14 DIAGNOSIS — M6281 Muscle weakness (generalized): Secondary | ICD-10-CM | POA: Diagnosis not present

## 2020-02-14 DIAGNOSIS — N390 Urinary tract infection, site not specified: Secondary | ICD-10-CM | POA: Diagnosis not present

## 2020-02-14 DIAGNOSIS — R1312 Dysphagia, oropharyngeal phase: Secondary | ICD-10-CM | POA: Diagnosis not present

## 2020-02-14 DIAGNOSIS — Z905 Acquired absence of kidney: Secondary | ICD-10-CM | POA: Diagnosis not present

## 2020-02-14 DIAGNOSIS — I69054 Hemiplegia and hemiparesis following nontraumatic subarachnoid hemorrhage affecting left non-dominant side: Secondary | ICD-10-CM | POA: Diagnosis not present

## 2020-02-14 DIAGNOSIS — I6330 Cerebral infarction due to thrombosis of unspecified cerebral artery: Secondary | ICD-10-CM | POA: Diagnosis not present

## 2020-02-14 DIAGNOSIS — R4189 Other symptoms and signs involving cognitive functions and awareness: Secondary | ICD-10-CM | POA: Diagnosis not present

## 2020-02-14 DIAGNOSIS — R2689 Other abnormalities of gait and mobility: Secondary | ICD-10-CM | POA: Diagnosis not present

## 2020-02-14 DIAGNOSIS — I69354 Hemiplegia and hemiparesis following cerebral infarction affecting left non-dominant side: Secondary | ICD-10-CM | POA: Diagnosis not present

## 2020-02-14 DIAGNOSIS — A498 Other bacterial infections of unspecified site: Secondary | ICD-10-CM | POA: Diagnosis not present

## 2020-02-15 DIAGNOSIS — R1312 Dysphagia, oropharyngeal phase: Secondary | ICD-10-CM | POA: Diagnosis not present

## 2020-02-15 DIAGNOSIS — I69054 Hemiplegia and hemiparesis following nontraumatic subarachnoid hemorrhage affecting left non-dominant side: Secondary | ICD-10-CM | POA: Diagnosis not present

## 2020-02-15 DIAGNOSIS — I6330 Cerebral infarction due to thrombosis of unspecified cerebral artery: Secondary | ICD-10-CM | POA: Diagnosis not present

## 2020-02-15 DIAGNOSIS — R2689 Other abnormalities of gait and mobility: Secondary | ICD-10-CM | POA: Diagnosis not present

## 2020-02-15 DIAGNOSIS — I69354 Hemiplegia and hemiparesis following cerebral infarction affecting left non-dominant side: Secondary | ICD-10-CM | POA: Diagnosis not present

## 2020-02-15 DIAGNOSIS — M6281 Muscle weakness (generalized): Secondary | ICD-10-CM | POA: Diagnosis not present

## 2020-02-16 DIAGNOSIS — I6330 Cerebral infarction due to thrombosis of unspecified cerebral artery: Secondary | ICD-10-CM | POA: Diagnosis not present

## 2020-02-16 DIAGNOSIS — I69354 Hemiplegia and hemiparesis following cerebral infarction affecting left non-dominant side: Secondary | ICD-10-CM | POA: Diagnosis not present

## 2020-02-16 DIAGNOSIS — M6281 Muscle weakness (generalized): Secondary | ICD-10-CM | POA: Diagnosis not present

## 2020-02-16 DIAGNOSIS — R2689 Other abnormalities of gait and mobility: Secondary | ICD-10-CM | POA: Diagnosis not present

## 2020-02-16 DIAGNOSIS — R1312 Dysphagia, oropharyngeal phase: Secondary | ICD-10-CM | POA: Diagnosis not present

## 2020-02-16 DIAGNOSIS — I69054 Hemiplegia and hemiparesis following nontraumatic subarachnoid hemorrhage affecting left non-dominant side: Secondary | ICD-10-CM | POA: Diagnosis not present

## 2020-02-17 DIAGNOSIS — I5032 Chronic diastolic (congestive) heart failure: Secondary | ICD-10-CM | POA: Diagnosis not present

## 2020-02-17 DIAGNOSIS — I480 Paroxysmal atrial fibrillation: Secondary | ICD-10-CM | POA: Diagnosis not present

## 2020-02-17 DIAGNOSIS — I69354 Hemiplegia and hemiparesis following cerebral infarction affecting left non-dominant side: Secondary | ICD-10-CM | POA: Diagnosis not present

## 2020-02-17 DIAGNOSIS — I63239 Cerebral infarction due to unspecified occlusion or stenosis of unspecified carotid arteries: Secondary | ICD-10-CM | POA: Diagnosis not present

## 2020-02-17 DIAGNOSIS — R1312 Dysphagia, oropharyngeal phase: Secondary | ICD-10-CM | POA: Diagnosis not present

## 2020-02-17 DIAGNOSIS — I69054 Hemiplegia and hemiparesis following nontraumatic subarachnoid hemorrhage affecting left non-dominant side: Secondary | ICD-10-CM | POA: Diagnosis not present

## 2020-02-17 DIAGNOSIS — R2689 Other abnormalities of gait and mobility: Secondary | ICD-10-CM | POA: Diagnosis not present

## 2020-02-17 DIAGNOSIS — I6330 Cerebral infarction due to thrombosis of unspecified cerebral artery: Secondary | ICD-10-CM | POA: Diagnosis not present

## 2020-02-17 DIAGNOSIS — M6281 Muscle weakness (generalized): Secondary | ICD-10-CM | POA: Diagnosis not present

## 2020-02-18 DIAGNOSIS — I69054 Hemiplegia and hemiparesis following nontraumatic subarachnoid hemorrhage affecting left non-dominant side: Secondary | ICD-10-CM | POA: Diagnosis not present

## 2020-02-18 DIAGNOSIS — I69354 Hemiplegia and hemiparesis following cerebral infarction affecting left non-dominant side: Secondary | ICD-10-CM | POA: Diagnosis not present

## 2020-02-18 DIAGNOSIS — I6330 Cerebral infarction due to thrombosis of unspecified cerebral artery: Secondary | ICD-10-CM | POA: Diagnosis not present

## 2020-02-18 DIAGNOSIS — M6281 Muscle weakness (generalized): Secondary | ICD-10-CM | POA: Diagnosis not present

## 2020-02-18 DIAGNOSIS — R2689 Other abnormalities of gait and mobility: Secondary | ICD-10-CM | POA: Diagnosis not present

## 2020-02-18 DIAGNOSIS — R1312 Dysphagia, oropharyngeal phase: Secondary | ICD-10-CM | POA: Diagnosis not present

## 2020-02-19 DIAGNOSIS — I69054 Hemiplegia and hemiparesis following nontraumatic subarachnoid hemorrhage affecting left non-dominant side: Secondary | ICD-10-CM | POA: Diagnosis not present

## 2020-02-19 DIAGNOSIS — R2689 Other abnormalities of gait and mobility: Secondary | ICD-10-CM | POA: Diagnosis not present

## 2020-02-19 DIAGNOSIS — R1312 Dysphagia, oropharyngeal phase: Secondary | ICD-10-CM | POA: Diagnosis not present

## 2020-02-19 DIAGNOSIS — M6281 Muscle weakness (generalized): Secondary | ICD-10-CM | POA: Diagnosis not present

## 2020-02-19 DIAGNOSIS — I6330 Cerebral infarction due to thrombosis of unspecified cerebral artery: Secondary | ICD-10-CM | POA: Diagnosis not present

## 2020-02-19 DIAGNOSIS — I69354 Hemiplegia and hemiparesis following cerebral infarction affecting left non-dominant side: Secondary | ICD-10-CM | POA: Diagnosis not present

## 2020-02-20 DIAGNOSIS — I6330 Cerebral infarction due to thrombosis of unspecified cerebral artery: Secondary | ICD-10-CM | POA: Diagnosis not present

## 2020-02-20 DIAGNOSIS — I69354 Hemiplegia and hemiparesis following cerebral infarction affecting left non-dominant side: Secondary | ICD-10-CM | POA: Diagnosis not present

## 2020-02-20 DIAGNOSIS — R2689 Other abnormalities of gait and mobility: Secondary | ICD-10-CM | POA: Diagnosis not present

## 2020-02-20 DIAGNOSIS — R1312 Dysphagia, oropharyngeal phase: Secondary | ICD-10-CM | POA: Diagnosis not present

## 2020-02-20 DIAGNOSIS — M6281 Muscle weakness (generalized): Secondary | ICD-10-CM | POA: Diagnosis not present

## 2020-02-20 DIAGNOSIS — I69054 Hemiplegia and hemiparesis following nontraumatic subarachnoid hemorrhage affecting left non-dominant side: Secondary | ICD-10-CM | POA: Diagnosis not present

## 2020-02-21 DIAGNOSIS — I69354 Hemiplegia and hemiparesis following cerebral infarction affecting left non-dominant side: Secondary | ICD-10-CM | POA: Diagnosis not present

## 2020-02-21 DIAGNOSIS — N390 Urinary tract infection, site not specified: Secondary | ICD-10-CM | POA: Diagnosis not present

## 2020-02-21 DIAGNOSIS — I69054 Hemiplegia and hemiparesis following nontraumatic subarachnoid hemorrhage affecting left non-dominant side: Secondary | ICD-10-CM | POA: Diagnosis not present

## 2020-02-21 DIAGNOSIS — R2689 Other abnormalities of gait and mobility: Secondary | ICD-10-CM | POA: Diagnosis not present

## 2020-02-21 DIAGNOSIS — R1312 Dysphagia, oropharyngeal phase: Secondary | ICD-10-CM | POA: Diagnosis not present

## 2020-02-21 DIAGNOSIS — I6330 Cerebral infarction due to thrombosis of unspecified cerebral artery: Secondary | ICD-10-CM | POA: Diagnosis not present

## 2020-02-21 DIAGNOSIS — A498 Other bacterial infections of unspecified site: Secondary | ICD-10-CM | POA: Diagnosis not present

## 2020-02-21 DIAGNOSIS — I69318 Other symptoms and signs involving cognitive functions following cerebral infarction: Secondary | ICD-10-CM | POA: Diagnosis not present

## 2020-02-21 DIAGNOSIS — M6281 Muscle weakness (generalized): Secondary | ICD-10-CM | POA: Diagnosis not present

## 2020-02-21 DIAGNOSIS — F3489 Other specified persistent mood disorders: Secondary | ICD-10-CM | POA: Diagnosis not present

## 2020-02-22 DIAGNOSIS — I69354 Hemiplegia and hemiparesis following cerebral infarction affecting left non-dominant side: Secondary | ICD-10-CM | POA: Diagnosis not present

## 2020-02-22 DIAGNOSIS — M6281 Muscle weakness (generalized): Secondary | ICD-10-CM | POA: Diagnosis not present

## 2020-02-22 DIAGNOSIS — R2689 Other abnormalities of gait and mobility: Secondary | ICD-10-CM | POA: Diagnosis not present

## 2020-02-22 DIAGNOSIS — I6330 Cerebral infarction due to thrombosis of unspecified cerebral artery: Secondary | ICD-10-CM | POA: Diagnosis not present

## 2020-02-22 DIAGNOSIS — R410 Disorientation, unspecified: Secondary | ICD-10-CM | POA: Diagnosis not present

## 2020-02-22 DIAGNOSIS — R1312 Dysphagia, oropharyngeal phase: Secondary | ICD-10-CM | POA: Diagnosis not present

## 2020-02-22 DIAGNOSIS — E1159 Type 2 diabetes mellitus with other circulatory complications: Secondary | ICD-10-CM | POA: Diagnosis not present

## 2020-02-22 DIAGNOSIS — I69054 Hemiplegia and hemiparesis following nontraumatic subarachnoid hemorrhage affecting left non-dominant side: Secondary | ICD-10-CM | POA: Diagnosis not present

## 2020-02-23 DIAGNOSIS — R2689 Other abnormalities of gait and mobility: Secondary | ICD-10-CM | POA: Diagnosis not present

## 2020-02-23 DIAGNOSIS — R1312 Dysphagia, oropharyngeal phase: Secondary | ICD-10-CM | POA: Diagnosis not present

## 2020-02-23 DIAGNOSIS — M6281 Muscle weakness (generalized): Secondary | ICD-10-CM | POA: Diagnosis not present

## 2020-02-23 DIAGNOSIS — I69054 Hemiplegia and hemiparesis following nontraumatic subarachnoid hemorrhage affecting left non-dominant side: Secondary | ICD-10-CM | POA: Diagnosis not present

## 2020-02-23 DIAGNOSIS — I69354 Hemiplegia and hemiparesis following cerebral infarction affecting left non-dominant side: Secondary | ICD-10-CM | POA: Diagnosis not present

## 2020-02-23 DIAGNOSIS — I6330 Cerebral infarction due to thrombosis of unspecified cerebral artery: Secondary | ICD-10-CM | POA: Diagnosis not present

## 2020-02-24 DIAGNOSIS — M6281 Muscle weakness (generalized): Secondary | ICD-10-CM | POA: Diagnosis not present

## 2020-02-24 DIAGNOSIS — R1312 Dysphagia, oropharyngeal phase: Secondary | ICD-10-CM | POA: Diagnosis not present

## 2020-02-24 DIAGNOSIS — R278 Other lack of coordination: Secondary | ICD-10-CM | POA: Diagnosis not present

## 2020-02-24 DIAGNOSIS — E1159 Type 2 diabetes mellitus with other circulatory complications: Secondary | ICD-10-CM | POA: Diagnosis not present

## 2020-02-24 DIAGNOSIS — E440 Moderate protein-calorie malnutrition: Secondary | ICD-10-CM | POA: Diagnosis not present

## 2020-02-24 DIAGNOSIS — N390 Urinary tract infection, site not specified: Secondary | ICD-10-CM | POA: Diagnosis not present

## 2020-02-24 DIAGNOSIS — R4701 Aphasia: Secondary | ICD-10-CM | POA: Diagnosis not present

## 2020-02-24 DIAGNOSIS — R2689 Other abnormalities of gait and mobility: Secondary | ICD-10-CM | POA: Diagnosis not present

## 2020-02-24 DIAGNOSIS — I69354 Hemiplegia and hemiparesis following cerebral infarction affecting left non-dominant side: Secondary | ICD-10-CM | POA: Diagnosis not present

## 2020-02-24 DIAGNOSIS — R410 Disorientation, unspecified: Secondary | ICD-10-CM | POA: Diagnosis not present

## 2020-02-24 DIAGNOSIS — I6330 Cerebral infarction due to thrombosis of unspecified cerebral artery: Secondary | ICD-10-CM | POA: Diagnosis not present

## 2020-02-24 DIAGNOSIS — I69054 Hemiplegia and hemiparesis following nontraumatic subarachnoid hemorrhage affecting left non-dominant side: Secondary | ICD-10-CM | POA: Diagnosis not present

## 2020-02-24 DIAGNOSIS — R41841 Cognitive communication deficit: Secondary | ICD-10-CM | POA: Diagnosis not present

## 2020-02-25 DIAGNOSIS — I6330 Cerebral infarction due to thrombosis of unspecified cerebral artery: Secondary | ICD-10-CM | POA: Diagnosis not present

## 2020-02-25 DIAGNOSIS — I69054 Hemiplegia and hemiparesis following nontraumatic subarachnoid hemorrhage affecting left non-dominant side: Secondary | ICD-10-CM | POA: Diagnosis not present

## 2020-02-25 DIAGNOSIS — R2689 Other abnormalities of gait and mobility: Secondary | ICD-10-CM | POA: Diagnosis not present

## 2020-02-25 DIAGNOSIS — I69354 Hemiplegia and hemiparesis following cerebral infarction affecting left non-dominant side: Secondary | ICD-10-CM | POA: Diagnosis not present

## 2020-02-25 DIAGNOSIS — M6281 Muscle weakness (generalized): Secondary | ICD-10-CM | POA: Diagnosis not present

## 2020-02-25 DIAGNOSIS — R1312 Dysphagia, oropharyngeal phase: Secondary | ICD-10-CM | POA: Diagnosis not present

## 2020-02-26 DIAGNOSIS — I69354 Hemiplegia and hemiparesis following cerebral infarction affecting left non-dominant side: Secondary | ICD-10-CM | POA: Diagnosis not present

## 2020-02-26 DIAGNOSIS — R2689 Other abnormalities of gait and mobility: Secondary | ICD-10-CM | POA: Diagnosis not present

## 2020-02-26 DIAGNOSIS — I6330 Cerebral infarction due to thrombosis of unspecified cerebral artery: Secondary | ICD-10-CM | POA: Diagnosis not present

## 2020-02-26 DIAGNOSIS — M6281 Muscle weakness (generalized): Secondary | ICD-10-CM | POA: Diagnosis not present

## 2020-02-26 DIAGNOSIS — R1312 Dysphagia, oropharyngeal phase: Secondary | ICD-10-CM | POA: Diagnosis not present

## 2020-02-26 DIAGNOSIS — I69054 Hemiplegia and hemiparesis following nontraumatic subarachnoid hemorrhage affecting left non-dominant side: Secondary | ICD-10-CM | POA: Diagnosis not present

## 2020-02-27 DIAGNOSIS — I69054 Hemiplegia and hemiparesis following nontraumatic subarachnoid hemorrhage affecting left non-dominant side: Secondary | ICD-10-CM | POA: Diagnosis not present

## 2020-02-27 DIAGNOSIS — I69354 Hemiplegia and hemiparesis following cerebral infarction affecting left non-dominant side: Secondary | ICD-10-CM | POA: Diagnosis not present

## 2020-02-27 DIAGNOSIS — R1312 Dysphagia, oropharyngeal phase: Secondary | ICD-10-CM | POA: Diagnosis not present

## 2020-02-27 DIAGNOSIS — R2689 Other abnormalities of gait and mobility: Secondary | ICD-10-CM | POA: Diagnosis not present

## 2020-02-27 DIAGNOSIS — I6330 Cerebral infarction due to thrombosis of unspecified cerebral artery: Secondary | ICD-10-CM | POA: Diagnosis not present

## 2020-02-27 DIAGNOSIS — M6281 Muscle weakness (generalized): Secondary | ICD-10-CM | POA: Diagnosis not present

## 2020-02-28 DIAGNOSIS — I69054 Hemiplegia and hemiparesis following nontraumatic subarachnoid hemorrhage affecting left non-dominant side: Secondary | ICD-10-CM | POA: Diagnosis not present

## 2020-02-28 DIAGNOSIS — I2510 Atherosclerotic heart disease of native coronary artery without angina pectoris: Secondary | ICD-10-CM | POA: Diagnosis not present

## 2020-02-28 DIAGNOSIS — R1312 Dysphagia, oropharyngeal phase: Secondary | ICD-10-CM | POA: Diagnosis not present

## 2020-02-28 DIAGNOSIS — I69354 Hemiplegia and hemiparesis following cerebral infarction affecting left non-dominant side: Secondary | ICD-10-CM | POA: Diagnosis not present

## 2020-02-28 DIAGNOSIS — R2689 Other abnormalities of gait and mobility: Secondary | ICD-10-CM | POA: Diagnosis not present

## 2020-02-28 DIAGNOSIS — I480 Paroxysmal atrial fibrillation: Secondary | ICD-10-CM | POA: Diagnosis not present

## 2020-02-28 DIAGNOSIS — I6330 Cerebral infarction due to thrombosis of unspecified cerebral artery: Secondary | ICD-10-CM | POA: Diagnosis not present

## 2020-02-28 DIAGNOSIS — M6281 Muscle weakness (generalized): Secondary | ICD-10-CM | POA: Diagnosis not present

## 2020-02-28 DIAGNOSIS — I959 Hypotension, unspecified: Secondary | ICD-10-CM | POA: Diagnosis not present

## 2020-02-29 DIAGNOSIS — R4189 Other symptoms and signs involving cognitive functions and awareness: Secondary | ICD-10-CM | POA: Diagnosis not present

## 2020-02-29 DIAGNOSIS — R2689 Other abnormalities of gait and mobility: Secondary | ICD-10-CM | POA: Diagnosis not present

## 2020-02-29 DIAGNOSIS — N390 Urinary tract infection, site not specified: Secondary | ICD-10-CM | POA: Diagnosis not present

## 2020-02-29 DIAGNOSIS — R1312 Dysphagia, oropharyngeal phase: Secondary | ICD-10-CM | POA: Diagnosis not present

## 2020-02-29 DIAGNOSIS — K0889 Other specified disorders of teeth and supporting structures: Secondary | ICD-10-CM | POA: Diagnosis not present

## 2020-02-29 DIAGNOSIS — I6330 Cerebral infarction due to thrombosis of unspecified cerebral artery: Secondary | ICD-10-CM | POA: Diagnosis not present

## 2020-02-29 DIAGNOSIS — I69354 Hemiplegia and hemiparesis following cerebral infarction affecting left non-dominant side: Secondary | ICD-10-CM | POA: Diagnosis not present

## 2020-02-29 DIAGNOSIS — I69054 Hemiplegia and hemiparesis following nontraumatic subarachnoid hemorrhage affecting left non-dominant side: Secondary | ICD-10-CM | POA: Diagnosis not present

## 2020-02-29 DIAGNOSIS — M6281 Muscle weakness (generalized): Secondary | ICD-10-CM | POA: Diagnosis not present

## 2020-03-01 DIAGNOSIS — R2689 Other abnormalities of gait and mobility: Secondary | ICD-10-CM | POA: Diagnosis not present

## 2020-03-01 DIAGNOSIS — I69054 Hemiplegia and hemiparesis following nontraumatic subarachnoid hemorrhage affecting left non-dominant side: Secondary | ICD-10-CM | POA: Diagnosis not present

## 2020-03-01 DIAGNOSIS — M6281 Muscle weakness (generalized): Secondary | ICD-10-CM | POA: Diagnosis not present

## 2020-03-01 DIAGNOSIS — R1312 Dysphagia, oropharyngeal phase: Secondary | ICD-10-CM | POA: Diagnosis not present

## 2020-03-01 DIAGNOSIS — I6330 Cerebral infarction due to thrombosis of unspecified cerebral artery: Secondary | ICD-10-CM | POA: Diagnosis not present

## 2020-03-01 DIAGNOSIS — I69354 Hemiplegia and hemiparesis following cerebral infarction affecting left non-dominant side: Secondary | ICD-10-CM | POA: Diagnosis not present

## 2020-03-02 DIAGNOSIS — M6281 Muscle weakness (generalized): Secondary | ICD-10-CM | POA: Diagnosis not present

## 2020-03-02 DIAGNOSIS — I69054 Hemiplegia and hemiparesis following nontraumatic subarachnoid hemorrhage affecting left non-dominant side: Secondary | ICD-10-CM | POA: Diagnosis not present

## 2020-03-02 DIAGNOSIS — R2689 Other abnormalities of gait and mobility: Secondary | ICD-10-CM | POA: Diagnosis not present

## 2020-03-02 DIAGNOSIS — R1312 Dysphagia, oropharyngeal phase: Secondary | ICD-10-CM | POA: Diagnosis not present

## 2020-03-02 DIAGNOSIS — I6330 Cerebral infarction due to thrombosis of unspecified cerebral artery: Secondary | ICD-10-CM | POA: Diagnosis not present

## 2020-03-02 DIAGNOSIS — I69354 Hemiplegia and hemiparesis following cerebral infarction affecting left non-dominant side: Secondary | ICD-10-CM | POA: Diagnosis not present

## 2020-03-03 DIAGNOSIS — M6281 Muscle weakness (generalized): Secondary | ICD-10-CM | POA: Diagnosis not present

## 2020-03-03 DIAGNOSIS — I69054 Hemiplegia and hemiparesis following nontraumatic subarachnoid hemorrhage affecting left non-dominant side: Secondary | ICD-10-CM | POA: Diagnosis not present

## 2020-03-03 DIAGNOSIS — I69354 Hemiplegia and hemiparesis following cerebral infarction affecting left non-dominant side: Secondary | ICD-10-CM | POA: Diagnosis not present

## 2020-03-03 DIAGNOSIS — R2689 Other abnormalities of gait and mobility: Secondary | ICD-10-CM | POA: Diagnosis not present

## 2020-03-03 DIAGNOSIS — I6330 Cerebral infarction due to thrombosis of unspecified cerebral artery: Secondary | ICD-10-CM | POA: Diagnosis not present

## 2020-03-03 DIAGNOSIS — R1312 Dysphagia, oropharyngeal phase: Secondary | ICD-10-CM | POA: Diagnosis not present

## 2020-03-04 DIAGNOSIS — I69354 Hemiplegia and hemiparesis following cerebral infarction affecting left non-dominant side: Secondary | ICD-10-CM | POA: Diagnosis not present

## 2020-03-04 DIAGNOSIS — M6281 Muscle weakness (generalized): Secondary | ICD-10-CM | POA: Diagnosis not present

## 2020-03-04 DIAGNOSIS — R1312 Dysphagia, oropharyngeal phase: Secondary | ICD-10-CM | POA: Diagnosis not present

## 2020-03-04 DIAGNOSIS — R2689 Other abnormalities of gait and mobility: Secondary | ICD-10-CM | POA: Diagnosis not present

## 2020-03-04 DIAGNOSIS — I69054 Hemiplegia and hemiparesis following nontraumatic subarachnoid hemorrhage affecting left non-dominant side: Secondary | ICD-10-CM | POA: Diagnosis not present

## 2020-03-04 DIAGNOSIS — I6330 Cerebral infarction due to thrombosis of unspecified cerebral artery: Secondary | ICD-10-CM | POA: Diagnosis not present

## 2020-03-05 DIAGNOSIS — I6330 Cerebral infarction due to thrombosis of unspecified cerebral artery: Secondary | ICD-10-CM | POA: Diagnosis not present

## 2020-03-05 DIAGNOSIS — R1312 Dysphagia, oropharyngeal phase: Secondary | ICD-10-CM | POA: Diagnosis not present

## 2020-03-05 DIAGNOSIS — M6281 Muscle weakness (generalized): Secondary | ICD-10-CM | POA: Diagnosis not present

## 2020-03-05 DIAGNOSIS — I69354 Hemiplegia and hemiparesis following cerebral infarction affecting left non-dominant side: Secondary | ICD-10-CM | POA: Diagnosis not present

## 2020-03-05 DIAGNOSIS — R2689 Other abnormalities of gait and mobility: Secondary | ICD-10-CM | POA: Diagnosis not present

## 2020-03-05 DIAGNOSIS — I69054 Hemiplegia and hemiparesis following nontraumatic subarachnoid hemorrhage affecting left non-dominant side: Secondary | ICD-10-CM | POA: Diagnosis not present

## 2020-03-06 DIAGNOSIS — R2689 Other abnormalities of gait and mobility: Secondary | ICD-10-CM | POA: Diagnosis not present

## 2020-03-06 DIAGNOSIS — I69354 Hemiplegia and hemiparesis following cerebral infarction affecting left non-dominant side: Secondary | ICD-10-CM | POA: Diagnosis not present

## 2020-03-06 DIAGNOSIS — M6281 Muscle weakness (generalized): Secondary | ICD-10-CM | POA: Diagnosis not present

## 2020-03-06 DIAGNOSIS — R1312 Dysphagia, oropharyngeal phase: Secondary | ICD-10-CM | POA: Diagnosis not present

## 2020-03-06 DIAGNOSIS — I69054 Hemiplegia and hemiparesis following nontraumatic subarachnoid hemorrhage affecting left non-dominant side: Secondary | ICD-10-CM | POA: Diagnosis not present

## 2020-03-06 DIAGNOSIS — I6330 Cerebral infarction due to thrombosis of unspecified cerebral artery: Secondary | ICD-10-CM | POA: Diagnosis not present

## 2020-03-07 DIAGNOSIS — F329 Major depressive disorder, single episode, unspecified: Secondary | ICD-10-CM | POA: Diagnosis not present

## 2020-03-07 DIAGNOSIS — R471 Dysarthria and anarthria: Secondary | ICD-10-CM | POA: Diagnosis not present

## 2020-03-07 DIAGNOSIS — I5032 Chronic diastolic (congestive) heart failure: Secondary | ICD-10-CM | POA: Diagnosis not present

## 2020-03-07 DIAGNOSIS — I480 Paroxysmal atrial fibrillation: Secondary | ICD-10-CM | POA: Diagnosis not present

## 2020-03-07 DIAGNOSIS — I6330 Cerebral infarction due to thrombosis of unspecified cerebral artery: Secondary | ICD-10-CM | POA: Diagnosis not present

## 2020-03-07 DIAGNOSIS — F419 Anxiety disorder, unspecified: Secondary | ICD-10-CM | POA: Diagnosis not present

## 2020-03-07 DIAGNOSIS — E1159 Type 2 diabetes mellitus with other circulatory complications: Secondary | ICD-10-CM | POA: Diagnosis not present

## 2020-03-07 DIAGNOSIS — E440 Moderate protein-calorie malnutrition: Secondary | ICD-10-CM | POA: Diagnosis not present

## 2020-03-07 DIAGNOSIS — M6281 Muscle weakness (generalized): Secondary | ICD-10-CM | POA: Diagnosis not present

## 2020-03-07 DIAGNOSIS — R1312 Dysphagia, oropharyngeal phase: Secondary | ICD-10-CM | POA: Diagnosis not present

## 2020-03-07 DIAGNOSIS — I69054 Hemiplegia and hemiparesis following nontraumatic subarachnoid hemorrhage affecting left non-dominant side: Secondary | ICD-10-CM | POA: Diagnosis not present

## 2020-03-07 DIAGNOSIS — R2689 Other abnormalities of gait and mobility: Secondary | ICD-10-CM | POA: Diagnosis not present

## 2020-03-07 DIAGNOSIS — I69354 Hemiplegia and hemiparesis following cerebral infarction affecting left non-dominant side: Secondary | ICD-10-CM | POA: Diagnosis not present

## 2020-03-08 DIAGNOSIS — I6330 Cerebral infarction due to thrombosis of unspecified cerebral artery: Secondary | ICD-10-CM | POA: Diagnosis not present

## 2020-03-08 DIAGNOSIS — M792 Neuralgia and neuritis, unspecified: Secondary | ICD-10-CM | POA: Diagnosis not present

## 2020-03-08 DIAGNOSIS — Z905 Acquired absence of kidney: Secondary | ICD-10-CM | POA: Diagnosis not present

## 2020-03-08 DIAGNOSIS — M6281 Muscle weakness (generalized): Secondary | ICD-10-CM | POA: Diagnosis not present

## 2020-03-08 DIAGNOSIS — R2689 Other abnormalities of gait and mobility: Secondary | ICD-10-CM | POA: Diagnosis not present

## 2020-03-08 DIAGNOSIS — I69354 Hemiplegia and hemiparesis following cerebral infarction affecting left non-dominant side: Secondary | ICD-10-CM | POA: Diagnosis not present

## 2020-03-08 DIAGNOSIS — R1312 Dysphagia, oropharyngeal phase: Secondary | ICD-10-CM | POA: Diagnosis not present

## 2020-03-08 DIAGNOSIS — I69054 Hemiplegia and hemiparesis following nontraumatic subarachnoid hemorrhage affecting left non-dominant side: Secondary | ICD-10-CM | POA: Diagnosis not present

## 2020-03-08 DIAGNOSIS — M25512 Pain in left shoulder: Secondary | ICD-10-CM | POA: Diagnosis not present

## 2020-03-09 DIAGNOSIS — I6330 Cerebral infarction due to thrombosis of unspecified cerebral artery: Secondary | ICD-10-CM | POA: Diagnosis not present

## 2020-03-09 DIAGNOSIS — R1312 Dysphagia, oropharyngeal phase: Secondary | ICD-10-CM | POA: Diagnosis not present

## 2020-03-09 DIAGNOSIS — I69054 Hemiplegia and hemiparesis following nontraumatic subarachnoid hemorrhage affecting left non-dominant side: Secondary | ICD-10-CM | POA: Diagnosis not present

## 2020-03-09 DIAGNOSIS — I69354 Hemiplegia and hemiparesis following cerebral infarction affecting left non-dominant side: Secondary | ICD-10-CM | POA: Diagnosis not present

## 2020-03-09 DIAGNOSIS — R2689 Other abnormalities of gait and mobility: Secondary | ICD-10-CM | POA: Diagnosis not present

## 2020-03-09 DIAGNOSIS — M6281 Muscle weakness (generalized): Secondary | ICD-10-CM | POA: Diagnosis not present

## 2020-03-10 DIAGNOSIS — I69354 Hemiplegia and hemiparesis following cerebral infarction affecting left non-dominant side: Secondary | ICD-10-CM | POA: Diagnosis not present

## 2020-03-10 DIAGNOSIS — M6281 Muscle weakness (generalized): Secondary | ICD-10-CM | POA: Diagnosis not present

## 2020-03-10 DIAGNOSIS — R2689 Other abnormalities of gait and mobility: Secondary | ICD-10-CM | POA: Diagnosis not present

## 2020-03-10 DIAGNOSIS — R1312 Dysphagia, oropharyngeal phase: Secondary | ICD-10-CM | POA: Diagnosis not present

## 2020-03-10 DIAGNOSIS — I6330 Cerebral infarction due to thrombosis of unspecified cerebral artery: Secondary | ICD-10-CM | POA: Diagnosis not present

## 2020-03-10 DIAGNOSIS — I69054 Hemiplegia and hemiparesis following nontraumatic subarachnoid hemorrhage affecting left non-dominant side: Secondary | ICD-10-CM | POA: Diagnosis not present

## 2020-03-11 DIAGNOSIS — R2689 Other abnormalities of gait and mobility: Secondary | ICD-10-CM | POA: Diagnosis not present

## 2020-03-11 DIAGNOSIS — I69354 Hemiplegia and hemiparesis following cerebral infarction affecting left non-dominant side: Secondary | ICD-10-CM | POA: Diagnosis not present

## 2020-03-11 DIAGNOSIS — I69054 Hemiplegia and hemiparesis following nontraumatic subarachnoid hemorrhage affecting left non-dominant side: Secondary | ICD-10-CM | POA: Diagnosis not present

## 2020-03-11 DIAGNOSIS — R1312 Dysphagia, oropharyngeal phase: Secondary | ICD-10-CM | POA: Diagnosis not present

## 2020-03-11 DIAGNOSIS — I6330 Cerebral infarction due to thrombosis of unspecified cerebral artery: Secondary | ICD-10-CM | POA: Diagnosis not present

## 2020-03-11 DIAGNOSIS — M6281 Muscle weakness (generalized): Secondary | ICD-10-CM | POA: Diagnosis not present

## 2020-03-12 DIAGNOSIS — M6281 Muscle weakness (generalized): Secondary | ICD-10-CM | POA: Diagnosis not present

## 2020-03-12 DIAGNOSIS — I69054 Hemiplegia and hemiparesis following nontraumatic subarachnoid hemorrhage affecting left non-dominant side: Secondary | ICD-10-CM | POA: Diagnosis not present

## 2020-03-12 DIAGNOSIS — I69354 Hemiplegia and hemiparesis following cerebral infarction affecting left non-dominant side: Secondary | ICD-10-CM | POA: Diagnosis not present

## 2020-03-12 DIAGNOSIS — R2689 Other abnormalities of gait and mobility: Secondary | ICD-10-CM | POA: Diagnosis not present

## 2020-03-12 DIAGNOSIS — R1312 Dysphagia, oropharyngeal phase: Secondary | ICD-10-CM | POA: Diagnosis not present

## 2020-03-12 DIAGNOSIS — I6330 Cerebral infarction due to thrombosis of unspecified cerebral artery: Secondary | ICD-10-CM | POA: Diagnosis not present

## 2020-03-13 DIAGNOSIS — U071 COVID-19: Secondary | ICD-10-CM | POA: Diagnosis not present

## 2020-03-13 DIAGNOSIS — R1312 Dysphagia, oropharyngeal phase: Secondary | ICD-10-CM | POA: Diagnosis not present

## 2020-03-13 DIAGNOSIS — I69354 Hemiplegia and hemiparesis following cerebral infarction affecting left non-dominant side: Secondary | ICD-10-CM | POA: Diagnosis not present

## 2020-03-13 DIAGNOSIS — I69054 Hemiplegia and hemiparesis following nontraumatic subarachnoid hemorrhage affecting left non-dominant side: Secondary | ICD-10-CM | POA: Diagnosis not present

## 2020-03-13 DIAGNOSIS — M6281 Muscle weakness (generalized): Secondary | ICD-10-CM | POA: Diagnosis not present

## 2020-03-13 DIAGNOSIS — I6330 Cerebral infarction due to thrombosis of unspecified cerebral artery: Secondary | ICD-10-CM | POA: Diagnosis not present

## 2020-03-13 DIAGNOSIS — R2689 Other abnormalities of gait and mobility: Secondary | ICD-10-CM | POA: Diagnosis not present

## 2020-03-14 DIAGNOSIS — I69054 Hemiplegia and hemiparesis following nontraumatic subarachnoid hemorrhage affecting left non-dominant side: Secondary | ICD-10-CM | POA: Diagnosis not present

## 2020-03-14 DIAGNOSIS — R2689 Other abnormalities of gait and mobility: Secondary | ICD-10-CM | POA: Diagnosis not present

## 2020-03-14 DIAGNOSIS — I6330 Cerebral infarction due to thrombosis of unspecified cerebral artery: Secondary | ICD-10-CM | POA: Diagnosis not present

## 2020-03-14 DIAGNOSIS — M6281 Muscle weakness (generalized): Secondary | ICD-10-CM | POA: Diagnosis not present

## 2020-03-14 DIAGNOSIS — I69354 Hemiplegia and hemiparesis following cerebral infarction affecting left non-dominant side: Secondary | ICD-10-CM | POA: Diagnosis not present

## 2020-03-14 DIAGNOSIS — R1312 Dysphagia, oropharyngeal phase: Secondary | ICD-10-CM | POA: Diagnosis not present

## 2020-03-15 DIAGNOSIS — I69354 Hemiplegia and hemiparesis following cerebral infarction affecting left non-dominant side: Secondary | ICD-10-CM | POA: Diagnosis not present

## 2020-03-15 DIAGNOSIS — I69054 Hemiplegia and hemiparesis following nontraumatic subarachnoid hemorrhage affecting left non-dominant side: Secondary | ICD-10-CM | POA: Diagnosis not present

## 2020-03-15 DIAGNOSIS — M6281 Muscle weakness (generalized): Secondary | ICD-10-CM | POA: Diagnosis not present

## 2020-03-15 DIAGNOSIS — I6330 Cerebral infarction due to thrombosis of unspecified cerebral artery: Secondary | ICD-10-CM | POA: Diagnosis not present

## 2020-03-15 DIAGNOSIS — R2689 Other abnormalities of gait and mobility: Secondary | ICD-10-CM | POA: Diagnosis not present

## 2020-03-15 DIAGNOSIS — F419 Anxiety disorder, unspecified: Secondary | ICD-10-CM | POA: Diagnosis not present

## 2020-03-15 DIAGNOSIS — R1312 Dysphagia, oropharyngeal phase: Secondary | ICD-10-CM | POA: Diagnosis not present

## 2020-03-15 DIAGNOSIS — E1159 Type 2 diabetes mellitus with other circulatory complications: Secondary | ICD-10-CM | POA: Diagnosis not present

## 2020-03-15 DIAGNOSIS — E440 Moderate protein-calorie malnutrition: Secondary | ICD-10-CM | POA: Diagnosis not present

## 2020-03-15 DIAGNOSIS — R471 Dysarthria and anarthria: Secondary | ICD-10-CM | POA: Diagnosis not present

## 2020-03-16 DIAGNOSIS — I69354 Hemiplegia and hemiparesis following cerebral infarction affecting left non-dominant side: Secondary | ICD-10-CM | POA: Diagnosis not present

## 2020-03-16 DIAGNOSIS — I6330 Cerebral infarction due to thrombosis of unspecified cerebral artery: Secondary | ICD-10-CM | POA: Diagnosis not present

## 2020-03-16 DIAGNOSIS — R1312 Dysphagia, oropharyngeal phase: Secondary | ICD-10-CM | POA: Diagnosis not present

## 2020-03-16 DIAGNOSIS — M6281 Muscle weakness (generalized): Secondary | ICD-10-CM | POA: Diagnosis not present

## 2020-03-16 DIAGNOSIS — I69054 Hemiplegia and hemiparesis following nontraumatic subarachnoid hemorrhage affecting left non-dominant side: Secondary | ICD-10-CM | POA: Diagnosis not present

## 2020-03-16 DIAGNOSIS — R2689 Other abnormalities of gait and mobility: Secondary | ICD-10-CM | POA: Diagnosis not present

## 2020-03-16 DIAGNOSIS — M792 Neuralgia and neuritis, unspecified: Secondary | ICD-10-CM | POA: Diagnosis not present

## 2020-03-17 DIAGNOSIS — M6281 Muscle weakness (generalized): Secondary | ICD-10-CM | POA: Diagnosis not present

## 2020-03-17 DIAGNOSIS — I69054 Hemiplegia and hemiparesis following nontraumatic subarachnoid hemorrhage affecting left non-dominant side: Secondary | ICD-10-CM | POA: Diagnosis not present

## 2020-03-17 DIAGNOSIS — R1312 Dysphagia, oropharyngeal phase: Secondary | ICD-10-CM | POA: Diagnosis not present

## 2020-03-17 DIAGNOSIS — I69354 Hemiplegia and hemiparesis following cerebral infarction affecting left non-dominant side: Secondary | ICD-10-CM | POA: Diagnosis not present

## 2020-03-17 DIAGNOSIS — R2689 Other abnormalities of gait and mobility: Secondary | ICD-10-CM | POA: Diagnosis not present

## 2020-03-17 DIAGNOSIS — I6330 Cerebral infarction due to thrombosis of unspecified cerebral artery: Secondary | ICD-10-CM | POA: Diagnosis not present

## 2020-03-18 DIAGNOSIS — I69354 Hemiplegia and hemiparesis following cerebral infarction affecting left non-dominant side: Secondary | ICD-10-CM | POA: Diagnosis not present

## 2020-03-18 DIAGNOSIS — R2689 Other abnormalities of gait and mobility: Secondary | ICD-10-CM | POA: Diagnosis not present

## 2020-03-18 DIAGNOSIS — M6281 Muscle weakness (generalized): Secondary | ICD-10-CM | POA: Diagnosis not present

## 2020-03-18 DIAGNOSIS — I69054 Hemiplegia and hemiparesis following nontraumatic subarachnoid hemorrhage affecting left non-dominant side: Secondary | ICD-10-CM | POA: Diagnosis not present

## 2020-03-18 DIAGNOSIS — I6330 Cerebral infarction due to thrombosis of unspecified cerebral artery: Secondary | ICD-10-CM | POA: Diagnosis not present

## 2020-03-18 DIAGNOSIS — R1312 Dysphagia, oropharyngeal phase: Secondary | ICD-10-CM | POA: Diagnosis not present

## 2020-03-19 DIAGNOSIS — I69354 Hemiplegia and hemiparesis following cerebral infarction affecting left non-dominant side: Secondary | ICD-10-CM | POA: Diagnosis not present

## 2020-03-19 DIAGNOSIS — R2689 Other abnormalities of gait and mobility: Secondary | ICD-10-CM | POA: Diagnosis not present

## 2020-03-19 DIAGNOSIS — R1312 Dysphagia, oropharyngeal phase: Secondary | ICD-10-CM | POA: Diagnosis not present

## 2020-03-19 DIAGNOSIS — M6281 Muscle weakness (generalized): Secondary | ICD-10-CM | POA: Diagnosis not present

## 2020-03-19 DIAGNOSIS — I69054 Hemiplegia and hemiparesis following nontraumatic subarachnoid hemorrhage affecting left non-dominant side: Secondary | ICD-10-CM | POA: Diagnosis not present

## 2020-03-19 DIAGNOSIS — I6330 Cerebral infarction due to thrombosis of unspecified cerebral artery: Secondary | ICD-10-CM | POA: Diagnosis not present

## 2020-03-20 DIAGNOSIS — R7989 Other specified abnormal findings of blood chemistry: Secondary | ICD-10-CM | POA: Diagnosis not present

## 2020-03-20 DIAGNOSIS — I6330 Cerebral infarction due to thrombosis of unspecified cerebral artery: Secondary | ICD-10-CM | POA: Diagnosis not present

## 2020-03-20 DIAGNOSIS — M6281 Muscle weakness (generalized): Secondary | ICD-10-CM | POA: Diagnosis not present

## 2020-03-20 DIAGNOSIS — I69054 Hemiplegia and hemiparesis following nontraumatic subarachnoid hemorrhage affecting left non-dominant side: Secondary | ICD-10-CM | POA: Diagnosis not present

## 2020-03-20 DIAGNOSIS — R2689 Other abnormalities of gait and mobility: Secondary | ICD-10-CM | POA: Diagnosis not present

## 2020-03-20 DIAGNOSIS — E559 Vitamin D deficiency, unspecified: Secondary | ICD-10-CM | POA: Diagnosis not present

## 2020-03-20 DIAGNOSIS — R1312 Dysphagia, oropharyngeal phase: Secondary | ICD-10-CM | POA: Diagnosis not present

## 2020-03-20 DIAGNOSIS — I69354 Hemiplegia and hemiparesis following cerebral infarction affecting left non-dominant side: Secondary | ICD-10-CM | POA: Diagnosis not present

## 2020-03-20 DIAGNOSIS — U071 COVID-19: Secondary | ICD-10-CM | POA: Diagnosis not present

## 2020-03-21 DIAGNOSIS — I6330 Cerebral infarction due to thrombosis of unspecified cerebral artery: Secondary | ICD-10-CM | POA: Diagnosis not present

## 2020-03-21 DIAGNOSIS — R471 Dysarthria and anarthria: Secondary | ICD-10-CM | POA: Diagnosis not present

## 2020-03-21 DIAGNOSIS — R2689 Other abnormalities of gait and mobility: Secondary | ICD-10-CM | POA: Diagnosis not present

## 2020-03-21 DIAGNOSIS — E1159 Type 2 diabetes mellitus with other circulatory complications: Secondary | ICD-10-CM | POA: Diagnosis not present

## 2020-03-21 DIAGNOSIS — M6281 Muscle weakness (generalized): Secondary | ICD-10-CM | POA: Diagnosis not present

## 2020-03-21 DIAGNOSIS — R1312 Dysphagia, oropharyngeal phase: Secondary | ICD-10-CM | POA: Diagnosis not present

## 2020-03-21 DIAGNOSIS — I69354 Hemiplegia and hemiparesis following cerebral infarction affecting left non-dominant side: Secondary | ICD-10-CM | POA: Diagnosis not present

## 2020-03-21 DIAGNOSIS — E440 Moderate protein-calorie malnutrition: Secondary | ICD-10-CM | POA: Diagnosis not present

## 2020-03-21 DIAGNOSIS — F419 Anxiety disorder, unspecified: Secondary | ICD-10-CM | POA: Diagnosis not present

## 2020-03-21 DIAGNOSIS — I69054 Hemiplegia and hemiparesis following nontraumatic subarachnoid hemorrhage affecting left non-dominant side: Secondary | ICD-10-CM | POA: Diagnosis not present

## 2020-03-22 DIAGNOSIS — M6281 Muscle weakness (generalized): Secondary | ICD-10-CM | POA: Diagnosis not present

## 2020-03-22 DIAGNOSIS — M792 Neuralgia and neuritis, unspecified: Secondary | ICD-10-CM | POA: Diagnosis not present

## 2020-03-22 DIAGNOSIS — E1165 Type 2 diabetes mellitus with hyperglycemia: Secondary | ICD-10-CM | POA: Diagnosis not present

## 2020-03-22 DIAGNOSIS — I69354 Hemiplegia and hemiparesis following cerebral infarction affecting left non-dominant side: Secondary | ICD-10-CM | POA: Diagnosis not present

## 2020-03-22 DIAGNOSIS — I6330 Cerebral infarction due to thrombosis of unspecified cerebral artery: Secondary | ICD-10-CM | POA: Diagnosis not present

## 2020-03-22 DIAGNOSIS — R2689 Other abnormalities of gait and mobility: Secondary | ICD-10-CM | POA: Diagnosis not present

## 2020-03-22 DIAGNOSIS — H1010 Acute atopic conjunctivitis, unspecified eye: Secondary | ICD-10-CM | POA: Diagnosis not present

## 2020-03-22 DIAGNOSIS — R1312 Dysphagia, oropharyngeal phase: Secondary | ICD-10-CM | POA: Diagnosis not present

## 2020-03-22 DIAGNOSIS — I69054 Hemiplegia and hemiparesis following nontraumatic subarachnoid hemorrhage affecting left non-dominant side: Secondary | ICD-10-CM | POA: Diagnosis not present

## 2020-03-22 DIAGNOSIS — M25512 Pain in left shoulder: Secondary | ICD-10-CM | POA: Diagnosis not present

## 2020-03-23 DIAGNOSIS — I6330 Cerebral infarction due to thrombosis of unspecified cerebral artery: Secondary | ICD-10-CM | POA: Diagnosis not present

## 2020-03-23 DIAGNOSIS — R2689 Other abnormalities of gait and mobility: Secondary | ICD-10-CM | POA: Diagnosis not present

## 2020-03-23 DIAGNOSIS — M6281 Muscle weakness (generalized): Secondary | ICD-10-CM | POA: Diagnosis not present

## 2020-03-23 DIAGNOSIS — I69054 Hemiplegia and hemiparesis following nontraumatic subarachnoid hemorrhage affecting left non-dominant side: Secondary | ICD-10-CM | POA: Diagnosis not present

## 2020-03-23 DIAGNOSIS — I69354 Hemiplegia and hemiparesis following cerebral infarction affecting left non-dominant side: Secondary | ICD-10-CM | POA: Diagnosis not present

## 2020-03-23 DIAGNOSIS — R1312 Dysphagia, oropharyngeal phase: Secondary | ICD-10-CM | POA: Diagnosis not present

## 2020-03-24 DIAGNOSIS — R2689 Other abnormalities of gait and mobility: Secondary | ICD-10-CM | POA: Diagnosis not present

## 2020-03-24 DIAGNOSIS — I6330 Cerebral infarction due to thrombosis of unspecified cerebral artery: Secondary | ICD-10-CM | POA: Diagnosis not present

## 2020-03-24 DIAGNOSIS — I69354 Hemiplegia and hemiparesis following cerebral infarction affecting left non-dominant side: Secondary | ICD-10-CM | POA: Diagnosis not present

## 2020-03-24 DIAGNOSIS — I69054 Hemiplegia and hemiparesis following nontraumatic subarachnoid hemorrhage affecting left non-dominant side: Secondary | ICD-10-CM | POA: Diagnosis not present

## 2020-03-24 DIAGNOSIS — M6281 Muscle weakness (generalized): Secondary | ICD-10-CM | POA: Diagnosis not present

## 2020-03-24 DIAGNOSIS — R1312 Dysphagia, oropharyngeal phase: Secondary | ICD-10-CM | POA: Diagnosis not present

## 2020-03-25 DIAGNOSIS — E1159 Type 2 diabetes mellitus with other circulatory complications: Secondary | ICD-10-CM | POA: Diagnosis not present

## 2020-03-25 DIAGNOSIS — R2689 Other abnormalities of gait and mobility: Secondary | ICD-10-CM | POA: Diagnosis not present

## 2020-03-25 DIAGNOSIS — I69054 Hemiplegia and hemiparesis following nontraumatic subarachnoid hemorrhage affecting left non-dominant side: Secondary | ICD-10-CM | POA: Diagnosis not present

## 2020-03-25 DIAGNOSIS — E440 Moderate protein-calorie malnutrition: Secondary | ICD-10-CM | POA: Diagnosis not present

## 2020-03-25 DIAGNOSIS — R1312 Dysphagia, oropharyngeal phase: Secondary | ICD-10-CM | POA: Diagnosis not present

## 2020-03-25 DIAGNOSIS — R278 Other lack of coordination: Secondary | ICD-10-CM | POA: Diagnosis not present

## 2020-03-25 DIAGNOSIS — M6281 Muscle weakness (generalized): Secondary | ICD-10-CM | POA: Diagnosis not present

## 2020-03-25 DIAGNOSIS — R4701 Aphasia: Secondary | ICD-10-CM | POA: Diagnosis not present

## 2020-03-25 DIAGNOSIS — I69354 Hemiplegia and hemiparesis following cerebral infarction affecting left non-dominant side: Secondary | ICD-10-CM | POA: Diagnosis not present

## 2020-03-25 DIAGNOSIS — R41841 Cognitive communication deficit: Secondary | ICD-10-CM | POA: Diagnosis not present

## 2020-03-25 DIAGNOSIS — I6330 Cerebral infarction due to thrombosis of unspecified cerebral artery: Secondary | ICD-10-CM | POA: Diagnosis not present

## 2020-03-26 DIAGNOSIS — I69054 Hemiplegia and hemiparesis following nontraumatic subarachnoid hemorrhage affecting left non-dominant side: Secondary | ICD-10-CM | POA: Diagnosis not present

## 2020-03-26 DIAGNOSIS — R1312 Dysphagia, oropharyngeal phase: Secondary | ICD-10-CM | POA: Diagnosis not present

## 2020-03-26 DIAGNOSIS — R2689 Other abnormalities of gait and mobility: Secondary | ICD-10-CM | POA: Diagnosis not present

## 2020-03-26 DIAGNOSIS — M6281 Muscle weakness (generalized): Secondary | ICD-10-CM | POA: Diagnosis not present

## 2020-03-26 DIAGNOSIS — I6330 Cerebral infarction due to thrombosis of unspecified cerebral artery: Secondary | ICD-10-CM | POA: Diagnosis not present

## 2020-03-26 DIAGNOSIS — I69354 Hemiplegia and hemiparesis following cerebral infarction affecting left non-dominant side: Secondary | ICD-10-CM | POA: Diagnosis not present

## 2020-03-27 DIAGNOSIS — I69354 Hemiplegia and hemiparesis following cerebral infarction affecting left non-dominant side: Secondary | ICD-10-CM | POA: Diagnosis not present

## 2020-03-27 DIAGNOSIS — I69054 Hemiplegia and hemiparesis following nontraumatic subarachnoid hemorrhage affecting left non-dominant side: Secondary | ICD-10-CM | POA: Diagnosis not present

## 2020-03-27 DIAGNOSIS — R2689 Other abnormalities of gait and mobility: Secondary | ICD-10-CM | POA: Diagnosis not present

## 2020-03-27 DIAGNOSIS — M6281 Muscle weakness (generalized): Secondary | ICD-10-CM | POA: Diagnosis not present

## 2020-03-27 DIAGNOSIS — I6330 Cerebral infarction due to thrombosis of unspecified cerebral artery: Secondary | ICD-10-CM | POA: Diagnosis not present

## 2020-03-27 DIAGNOSIS — R1312 Dysphagia, oropharyngeal phase: Secondary | ICD-10-CM | POA: Diagnosis not present

## 2020-03-28 DIAGNOSIS — I69354 Hemiplegia and hemiparesis following cerebral infarction affecting left non-dominant side: Secondary | ICD-10-CM | POA: Diagnosis not present

## 2020-03-28 DIAGNOSIS — I69054 Hemiplegia and hemiparesis following nontraumatic subarachnoid hemorrhage affecting left non-dominant side: Secondary | ICD-10-CM | POA: Diagnosis not present

## 2020-03-28 DIAGNOSIS — I6330 Cerebral infarction due to thrombosis of unspecified cerebral artery: Secondary | ICD-10-CM | POA: Diagnosis not present

## 2020-03-28 DIAGNOSIS — R2689 Other abnormalities of gait and mobility: Secondary | ICD-10-CM | POA: Diagnosis not present

## 2020-03-28 DIAGNOSIS — R1312 Dysphagia, oropharyngeal phase: Secondary | ICD-10-CM | POA: Diagnosis not present

## 2020-03-28 DIAGNOSIS — M6281 Muscle weakness (generalized): Secondary | ICD-10-CM | POA: Diagnosis not present

## 2020-03-29 DIAGNOSIS — I69054 Hemiplegia and hemiparesis following nontraumatic subarachnoid hemorrhage affecting left non-dominant side: Secondary | ICD-10-CM | POA: Diagnosis not present

## 2020-03-29 DIAGNOSIS — R1312 Dysphagia, oropharyngeal phase: Secondary | ICD-10-CM | POA: Diagnosis not present

## 2020-03-29 DIAGNOSIS — R2689 Other abnormalities of gait and mobility: Secondary | ICD-10-CM | POA: Diagnosis not present

## 2020-03-29 DIAGNOSIS — I6330 Cerebral infarction due to thrombosis of unspecified cerebral artery: Secondary | ICD-10-CM | POA: Diagnosis not present

## 2020-03-29 DIAGNOSIS — M6281 Muscle weakness (generalized): Secondary | ICD-10-CM | POA: Diagnosis not present

## 2020-03-29 DIAGNOSIS — I69354 Hemiplegia and hemiparesis following cerebral infarction affecting left non-dominant side: Secondary | ICD-10-CM | POA: Diagnosis not present

## 2020-03-30 DIAGNOSIS — R2689 Other abnormalities of gait and mobility: Secondary | ICD-10-CM | POA: Diagnosis not present

## 2020-03-30 DIAGNOSIS — I69354 Hemiplegia and hemiparesis following cerebral infarction affecting left non-dominant side: Secondary | ICD-10-CM | POA: Diagnosis not present

## 2020-03-30 DIAGNOSIS — M6281 Muscle weakness (generalized): Secondary | ICD-10-CM | POA: Diagnosis not present

## 2020-03-30 DIAGNOSIS — R1312 Dysphagia, oropharyngeal phase: Secondary | ICD-10-CM | POA: Diagnosis not present

## 2020-03-30 DIAGNOSIS — Z23 Encounter for immunization: Secondary | ICD-10-CM | POA: Diagnosis not present

## 2020-03-30 DIAGNOSIS — I69054 Hemiplegia and hemiparesis following nontraumatic subarachnoid hemorrhage affecting left non-dominant side: Secondary | ICD-10-CM | POA: Diagnosis not present

## 2020-03-30 DIAGNOSIS — I6330 Cerebral infarction due to thrombosis of unspecified cerebral artery: Secondary | ICD-10-CM | POA: Diagnosis not present

## 2020-03-31 DIAGNOSIS — M6281 Muscle weakness (generalized): Secondary | ICD-10-CM | POA: Diagnosis not present

## 2020-03-31 DIAGNOSIS — R2689 Other abnormalities of gait and mobility: Secondary | ICD-10-CM | POA: Diagnosis not present

## 2020-03-31 DIAGNOSIS — I69054 Hemiplegia and hemiparesis following nontraumatic subarachnoid hemorrhage affecting left non-dominant side: Secondary | ICD-10-CM | POA: Diagnosis not present

## 2020-03-31 DIAGNOSIS — R1312 Dysphagia, oropharyngeal phase: Secondary | ICD-10-CM | POA: Diagnosis not present

## 2020-03-31 DIAGNOSIS — I69354 Hemiplegia and hemiparesis following cerebral infarction affecting left non-dominant side: Secondary | ICD-10-CM | POA: Diagnosis not present

## 2020-03-31 DIAGNOSIS — I6330 Cerebral infarction due to thrombosis of unspecified cerebral artery: Secondary | ICD-10-CM | POA: Diagnosis not present

## 2020-04-01 DIAGNOSIS — R1312 Dysphagia, oropharyngeal phase: Secondary | ICD-10-CM | POA: Diagnosis not present

## 2020-04-01 DIAGNOSIS — I69354 Hemiplegia and hemiparesis following cerebral infarction affecting left non-dominant side: Secondary | ICD-10-CM | POA: Diagnosis not present

## 2020-04-01 DIAGNOSIS — M6281 Muscle weakness (generalized): Secondary | ICD-10-CM | POA: Diagnosis not present

## 2020-04-01 DIAGNOSIS — I6330 Cerebral infarction due to thrombosis of unspecified cerebral artery: Secondary | ICD-10-CM | POA: Diagnosis not present

## 2020-04-01 DIAGNOSIS — I69054 Hemiplegia and hemiparesis following nontraumatic subarachnoid hemorrhage affecting left non-dominant side: Secondary | ICD-10-CM | POA: Diagnosis not present

## 2020-04-01 DIAGNOSIS — R2689 Other abnormalities of gait and mobility: Secondary | ICD-10-CM | POA: Diagnosis not present

## 2020-04-02 DIAGNOSIS — R1312 Dysphagia, oropharyngeal phase: Secondary | ICD-10-CM | POA: Diagnosis not present

## 2020-04-02 DIAGNOSIS — M6281 Muscle weakness (generalized): Secondary | ICD-10-CM | POA: Diagnosis not present

## 2020-04-02 DIAGNOSIS — I6330 Cerebral infarction due to thrombosis of unspecified cerebral artery: Secondary | ICD-10-CM | POA: Diagnosis not present

## 2020-04-02 DIAGNOSIS — I69354 Hemiplegia and hemiparesis following cerebral infarction affecting left non-dominant side: Secondary | ICD-10-CM | POA: Diagnosis not present

## 2020-04-02 DIAGNOSIS — R2689 Other abnormalities of gait and mobility: Secondary | ICD-10-CM | POA: Diagnosis not present

## 2020-04-02 DIAGNOSIS — I69054 Hemiplegia and hemiparesis following nontraumatic subarachnoid hemorrhage affecting left non-dominant side: Secondary | ICD-10-CM | POA: Diagnosis not present

## 2020-04-03 DIAGNOSIS — R2689 Other abnormalities of gait and mobility: Secondary | ICD-10-CM | POA: Diagnosis not present

## 2020-04-03 DIAGNOSIS — I69354 Hemiplegia and hemiparesis following cerebral infarction affecting left non-dominant side: Secondary | ICD-10-CM | POA: Diagnosis not present

## 2020-04-03 DIAGNOSIS — I6330 Cerebral infarction due to thrombosis of unspecified cerebral artery: Secondary | ICD-10-CM | POA: Diagnosis not present

## 2020-04-03 DIAGNOSIS — I69054 Hemiplegia and hemiparesis following nontraumatic subarachnoid hemorrhage affecting left non-dominant side: Secondary | ICD-10-CM | POA: Diagnosis not present

## 2020-04-03 DIAGNOSIS — M6281 Muscle weakness (generalized): Secondary | ICD-10-CM | POA: Diagnosis not present

## 2020-04-03 DIAGNOSIS — R1312 Dysphagia, oropharyngeal phase: Secondary | ICD-10-CM | POA: Diagnosis not present

## 2020-04-04 DIAGNOSIS — I69054 Hemiplegia and hemiparesis following nontraumatic subarachnoid hemorrhage affecting left non-dominant side: Secondary | ICD-10-CM | POA: Diagnosis not present

## 2020-04-04 DIAGNOSIS — M6281 Muscle weakness (generalized): Secondary | ICD-10-CM | POA: Diagnosis not present

## 2020-04-04 DIAGNOSIS — R1312 Dysphagia, oropharyngeal phase: Secondary | ICD-10-CM | POA: Diagnosis not present

## 2020-04-04 DIAGNOSIS — I69354 Hemiplegia and hemiparesis following cerebral infarction affecting left non-dominant side: Secondary | ICD-10-CM | POA: Diagnosis not present

## 2020-04-04 DIAGNOSIS — R2689 Other abnormalities of gait and mobility: Secondary | ICD-10-CM | POA: Diagnosis not present

## 2020-04-04 DIAGNOSIS — I6330 Cerebral infarction due to thrombosis of unspecified cerebral artery: Secondary | ICD-10-CM | POA: Diagnosis not present

## 2020-04-05 DIAGNOSIS — R1312 Dysphagia, oropharyngeal phase: Secondary | ICD-10-CM | POA: Diagnosis not present

## 2020-04-05 DIAGNOSIS — M6281 Muscle weakness (generalized): Secondary | ICD-10-CM | POA: Diagnosis not present

## 2020-04-05 DIAGNOSIS — I69054 Hemiplegia and hemiparesis following nontraumatic subarachnoid hemorrhage affecting left non-dominant side: Secondary | ICD-10-CM | POA: Diagnosis not present

## 2020-04-05 DIAGNOSIS — R2689 Other abnormalities of gait and mobility: Secondary | ICD-10-CM | POA: Diagnosis not present

## 2020-04-05 DIAGNOSIS — I69354 Hemiplegia and hemiparesis following cerebral infarction affecting left non-dominant side: Secondary | ICD-10-CM | POA: Diagnosis not present

## 2020-04-05 DIAGNOSIS — I6330 Cerebral infarction due to thrombosis of unspecified cerebral artery: Secondary | ICD-10-CM | POA: Diagnosis not present

## 2020-04-06 DIAGNOSIS — I69054 Hemiplegia and hemiparesis following nontraumatic subarachnoid hemorrhage affecting left non-dominant side: Secondary | ICD-10-CM | POA: Diagnosis not present

## 2020-04-06 DIAGNOSIS — R2689 Other abnormalities of gait and mobility: Secondary | ICD-10-CM | POA: Diagnosis not present

## 2020-04-06 DIAGNOSIS — I6330 Cerebral infarction due to thrombosis of unspecified cerebral artery: Secondary | ICD-10-CM | POA: Diagnosis not present

## 2020-04-06 DIAGNOSIS — R1312 Dysphagia, oropharyngeal phase: Secondary | ICD-10-CM | POA: Diagnosis not present

## 2020-04-06 DIAGNOSIS — M6281 Muscle weakness (generalized): Secondary | ICD-10-CM | POA: Diagnosis not present

## 2020-04-06 DIAGNOSIS — I69354 Hemiplegia and hemiparesis following cerebral infarction affecting left non-dominant side: Secondary | ICD-10-CM | POA: Diagnosis not present

## 2020-04-07 DIAGNOSIS — R2689 Other abnormalities of gait and mobility: Secondary | ICD-10-CM | POA: Diagnosis not present

## 2020-04-07 DIAGNOSIS — I69354 Hemiplegia and hemiparesis following cerebral infarction affecting left non-dominant side: Secondary | ICD-10-CM | POA: Diagnosis not present

## 2020-04-07 DIAGNOSIS — I69054 Hemiplegia and hemiparesis following nontraumatic subarachnoid hemorrhage affecting left non-dominant side: Secondary | ICD-10-CM | POA: Diagnosis not present

## 2020-04-07 DIAGNOSIS — M6281 Muscle weakness (generalized): Secondary | ICD-10-CM | POA: Diagnosis not present

## 2020-04-07 DIAGNOSIS — I6330 Cerebral infarction due to thrombosis of unspecified cerebral artery: Secondary | ICD-10-CM | POA: Diagnosis not present

## 2020-04-07 DIAGNOSIS — R1312 Dysphagia, oropharyngeal phase: Secondary | ICD-10-CM | POA: Diagnosis not present

## 2020-04-08 DIAGNOSIS — I6330 Cerebral infarction due to thrombosis of unspecified cerebral artery: Secondary | ICD-10-CM | POA: Diagnosis not present

## 2020-04-08 DIAGNOSIS — R2689 Other abnormalities of gait and mobility: Secondary | ICD-10-CM | POA: Diagnosis not present

## 2020-04-08 DIAGNOSIS — R1312 Dysphagia, oropharyngeal phase: Secondary | ICD-10-CM | POA: Diagnosis not present

## 2020-04-08 DIAGNOSIS — M6281 Muscle weakness (generalized): Secondary | ICD-10-CM | POA: Diagnosis not present

## 2020-04-08 DIAGNOSIS — I69354 Hemiplegia and hemiparesis following cerebral infarction affecting left non-dominant side: Secondary | ICD-10-CM | POA: Diagnosis not present

## 2020-04-08 DIAGNOSIS — I69054 Hemiplegia and hemiparesis following nontraumatic subarachnoid hemorrhage affecting left non-dominant side: Secondary | ICD-10-CM | POA: Diagnosis not present

## 2020-04-10 DIAGNOSIS — I69054 Hemiplegia and hemiparesis following nontraumatic subarachnoid hemorrhage affecting left non-dominant side: Secondary | ICD-10-CM | POA: Diagnosis not present

## 2020-04-10 DIAGNOSIS — R2689 Other abnormalities of gait and mobility: Secondary | ICD-10-CM | POA: Diagnosis not present

## 2020-04-10 DIAGNOSIS — M6281 Muscle weakness (generalized): Secondary | ICD-10-CM | POA: Diagnosis not present

## 2020-04-10 DIAGNOSIS — I69354 Hemiplegia and hemiparesis following cerebral infarction affecting left non-dominant side: Secondary | ICD-10-CM | POA: Diagnosis not present

## 2020-04-10 DIAGNOSIS — R1312 Dysphagia, oropharyngeal phase: Secondary | ICD-10-CM | POA: Diagnosis not present

## 2020-04-10 DIAGNOSIS — I6330 Cerebral infarction due to thrombosis of unspecified cerebral artery: Secondary | ICD-10-CM | POA: Diagnosis not present

## 2020-04-11 DIAGNOSIS — M6281 Muscle weakness (generalized): Secondary | ICD-10-CM | POA: Diagnosis not present

## 2020-04-11 DIAGNOSIS — R1312 Dysphagia, oropharyngeal phase: Secondary | ICD-10-CM | POA: Diagnosis not present

## 2020-04-11 DIAGNOSIS — R2689 Other abnormalities of gait and mobility: Secondary | ICD-10-CM | POA: Diagnosis not present

## 2020-04-11 DIAGNOSIS — I6330 Cerebral infarction due to thrombosis of unspecified cerebral artery: Secondary | ICD-10-CM | POA: Diagnosis not present

## 2020-04-11 DIAGNOSIS — I69054 Hemiplegia and hemiparesis following nontraumatic subarachnoid hemorrhage affecting left non-dominant side: Secondary | ICD-10-CM | POA: Diagnosis not present

## 2020-04-11 DIAGNOSIS — I69354 Hemiplegia and hemiparesis following cerebral infarction affecting left non-dominant side: Secondary | ICD-10-CM | POA: Diagnosis not present

## 2020-04-12 DIAGNOSIS — M6281 Muscle weakness (generalized): Secondary | ICD-10-CM | POA: Diagnosis not present

## 2020-04-12 DIAGNOSIS — I69354 Hemiplegia and hemiparesis following cerebral infarction affecting left non-dominant side: Secondary | ICD-10-CM | POA: Diagnosis not present

## 2020-04-12 DIAGNOSIS — R1312 Dysphagia, oropharyngeal phase: Secondary | ICD-10-CM | POA: Diagnosis not present

## 2020-04-12 DIAGNOSIS — I6330 Cerebral infarction due to thrombosis of unspecified cerebral artery: Secondary | ICD-10-CM | POA: Diagnosis not present

## 2020-04-12 DIAGNOSIS — I69054 Hemiplegia and hemiparesis following nontraumatic subarachnoid hemorrhage affecting left non-dominant side: Secondary | ICD-10-CM | POA: Diagnosis not present

## 2020-04-12 DIAGNOSIS — R2689 Other abnormalities of gait and mobility: Secondary | ICD-10-CM | POA: Diagnosis not present

## 2020-04-13 DIAGNOSIS — R1312 Dysphagia, oropharyngeal phase: Secondary | ICD-10-CM | POA: Diagnosis not present

## 2020-04-13 DIAGNOSIS — M6281 Muscle weakness (generalized): Secondary | ICD-10-CM | POA: Diagnosis not present

## 2020-04-13 DIAGNOSIS — R2689 Other abnormalities of gait and mobility: Secondary | ICD-10-CM | POA: Diagnosis not present

## 2020-04-13 DIAGNOSIS — I69054 Hemiplegia and hemiparesis following nontraumatic subarachnoid hemorrhage affecting left non-dominant side: Secondary | ICD-10-CM | POA: Diagnosis not present

## 2020-04-13 DIAGNOSIS — I6330 Cerebral infarction due to thrombosis of unspecified cerebral artery: Secondary | ICD-10-CM | POA: Diagnosis not present

## 2020-04-13 DIAGNOSIS — I69354 Hemiplegia and hemiparesis following cerebral infarction affecting left non-dominant side: Secondary | ICD-10-CM | POA: Diagnosis not present

## 2020-04-14 DIAGNOSIS — R2689 Other abnormalities of gait and mobility: Secondary | ICD-10-CM | POA: Diagnosis not present

## 2020-04-14 DIAGNOSIS — I6330 Cerebral infarction due to thrombosis of unspecified cerebral artery: Secondary | ICD-10-CM | POA: Diagnosis not present

## 2020-04-14 DIAGNOSIS — I69054 Hemiplegia and hemiparesis following nontraumatic subarachnoid hemorrhage affecting left non-dominant side: Secondary | ICD-10-CM | POA: Diagnosis not present

## 2020-04-14 DIAGNOSIS — R1312 Dysphagia, oropharyngeal phase: Secondary | ICD-10-CM | POA: Diagnosis not present

## 2020-04-14 DIAGNOSIS — M6281 Muscle weakness (generalized): Secondary | ICD-10-CM | POA: Diagnosis not present

## 2020-04-14 DIAGNOSIS — I69354 Hemiplegia and hemiparesis following cerebral infarction affecting left non-dominant side: Secondary | ICD-10-CM | POA: Diagnosis not present

## 2020-04-17 DIAGNOSIS — I69054 Hemiplegia and hemiparesis following nontraumatic subarachnoid hemorrhage affecting left non-dominant side: Secondary | ICD-10-CM | POA: Diagnosis not present

## 2020-04-17 DIAGNOSIS — R2689 Other abnormalities of gait and mobility: Secondary | ICD-10-CM | POA: Diagnosis not present

## 2020-04-17 DIAGNOSIS — I6330 Cerebral infarction due to thrombosis of unspecified cerebral artery: Secondary | ICD-10-CM | POA: Diagnosis not present

## 2020-04-17 DIAGNOSIS — I69354 Hemiplegia and hemiparesis following cerebral infarction affecting left non-dominant side: Secondary | ICD-10-CM | POA: Diagnosis not present

## 2020-04-17 DIAGNOSIS — M6281 Muscle weakness (generalized): Secondary | ICD-10-CM | POA: Diagnosis not present

## 2020-04-17 DIAGNOSIS — R1312 Dysphagia, oropharyngeal phase: Secondary | ICD-10-CM | POA: Diagnosis not present

## 2020-04-18 DIAGNOSIS — I69054 Hemiplegia and hemiparesis following nontraumatic subarachnoid hemorrhage affecting left non-dominant side: Secondary | ICD-10-CM | POA: Diagnosis not present

## 2020-04-18 DIAGNOSIS — I6330 Cerebral infarction due to thrombosis of unspecified cerebral artery: Secondary | ICD-10-CM | POA: Diagnosis not present

## 2020-04-18 DIAGNOSIS — I10 Essential (primary) hypertension: Secondary | ICD-10-CM | POA: Diagnosis not present

## 2020-04-18 DIAGNOSIS — I480 Paroxysmal atrial fibrillation: Secondary | ICD-10-CM | POA: Diagnosis not present

## 2020-04-18 DIAGNOSIS — R2689 Other abnormalities of gait and mobility: Secondary | ICD-10-CM | POA: Diagnosis not present

## 2020-04-18 DIAGNOSIS — R1312 Dysphagia, oropharyngeal phase: Secondary | ICD-10-CM | POA: Diagnosis not present

## 2020-04-18 DIAGNOSIS — I5032 Chronic diastolic (congestive) heart failure: Secondary | ICD-10-CM | POA: Diagnosis not present

## 2020-04-18 DIAGNOSIS — E118 Type 2 diabetes mellitus with unspecified complications: Secondary | ICD-10-CM | POA: Diagnosis not present

## 2020-04-18 DIAGNOSIS — D6489 Other specified anemias: Secondary | ICD-10-CM | POA: Diagnosis not present

## 2020-04-18 DIAGNOSIS — M6281 Muscle weakness (generalized): Secondary | ICD-10-CM | POA: Diagnosis not present

## 2020-04-18 DIAGNOSIS — I69354 Hemiplegia and hemiparesis following cerebral infarction affecting left non-dominant side: Secondary | ICD-10-CM | POA: Diagnosis not present

## 2020-04-21 DIAGNOSIS — M792 Neuralgia and neuritis, unspecified: Secondary | ICD-10-CM | POA: Diagnosis not present

## 2020-04-21 DIAGNOSIS — G8929 Other chronic pain: Secondary | ICD-10-CM | POA: Diagnosis not present

## 2020-05-02 DIAGNOSIS — R4189 Other symptoms and signs involving cognitive functions and awareness: Secondary | ICD-10-CM | POA: Diagnosis not present

## 2020-05-02 DIAGNOSIS — R300 Dysuria: Secondary | ICD-10-CM | POA: Diagnosis not present

## 2020-05-02 DIAGNOSIS — K0889 Other specified disorders of teeth and supporting structures: Secondary | ICD-10-CM | POA: Diagnosis not present

## 2020-05-04 DIAGNOSIS — Z79899 Other long term (current) drug therapy: Secondary | ICD-10-CM | POA: Diagnosis not present

## 2020-05-04 DIAGNOSIS — N390 Urinary tract infection, site not specified: Secondary | ICD-10-CM | POA: Diagnosis not present

## 2020-06-12 ENCOUNTER — Telehealth: Payer: Self-pay | Admitting: *Deleted

## 2020-06-12 NOTE — Telephone Encounter (Signed)
Patient with diagnosis of afib on Eliquis for anticoagulation.    Procedure: 4 dental extractions Date of procedure: TBD  CHADS2-VASc score of 74 (age x2, sex, CHF, HTN, DM, CAD, multiple CVAs, most recently October 2020)  CrCl 28mL/min Platelet count 321K  Pt is at very elevated cardiac risk off of her Eliquis. Recommend holding for 1 day and resume as soon as possible after extractions. Pt has not been seen by cardiology in 5 years and will require visit before final clearance can be provided.

## 2020-06-12 NOTE — Telephone Encounter (Signed)
° °  [facility] Pre-operative Risk Assessment    Primary cardiologisit - Dr Dani Gobble Croitoru  Request for surgical clearance:  1. What type of surgery is being performed? 4  Teeth extracted under sedation  2. When is this surgery scheduled?  tbd  3. What type of clearance is required (medical clearance vs. Pharmacy clearance to hold med vs. Both)?  Medical  4. Are there any medications that need to be held prior to surgery and how long?Eliqius   5. Practice name and name of physician performing surgery?  [facility]   6. What is the office phone number? [phone]   7.   What is the office fax number? [phone]  8.   Anesthesia type (None, local, MAC, general) ? unknown   Raiford Simmonds 06/12/2020, 10:55 AM  _________________________________________________________________   (provider comments below)

## 2020-06-12 NOTE — Telephone Encounter (Signed)
Please contact pt to schedule appt for cardiac clearance per Corine Shelter, PA.

## 2020-06-13 NOTE — Telephone Encounter (Signed)
Pt is scheduled for a office visit 07/05/20 and clearance will be discussed at that time.  Will fax back to requesting surgeon's office to make them aware.

## 2020-06-14 NOTE — Telephone Encounter (Signed)
Patient's husband is calling in regards to his wife's 07/05/20 appt for preop clearance. He states that she is at [facility] and is paralyzed on her left side and will need transportation. He is unsure if she can get transportation due to cost. Please advise.

## 2020-06-14 NOTE — Telephone Encounter (Signed)
Left detailed message on [name]-scheduling coordinator's VM with Pt name and DOB and Appt date and time for upcoming appt to CB if there are any issues with transportation.   Left detailed message for [name] and to [facility] if anything further is needed.

## 2020-06-20 DIAGNOSIS — I110 Hypertensive heart disease with heart failure: Secondary | ICD-10-CM | POA: Diagnosis not present

## 2020-06-20 DIAGNOSIS — I69359 Hemiplegia and hemiparesis following cerebral infarction affecting unspecified side: Secondary | ICD-10-CM | POA: Diagnosis not present

## 2020-06-20 DIAGNOSIS — I69320 Aphasia following cerebral infarction: Secondary | ICD-10-CM | POA: Diagnosis not present

## 2020-06-20 DIAGNOSIS — I5032 Chronic diastolic (congestive) heart failure: Secondary | ICD-10-CM | POA: Diagnosis not present

## 2020-06-27 DIAGNOSIS — D508 Other iron deficiency anemias: Secondary | ICD-10-CM | POA: Diagnosis not present

## 2020-06-27 DIAGNOSIS — G8929 Other chronic pain: Secondary | ICD-10-CM | POA: Diagnosis not present

## 2020-06-30 DIAGNOSIS — Z03818 Encounter for observation for suspected exposure to other biological agents ruled out: Secondary | ICD-10-CM | POA: Diagnosis not present

## 2020-07-04 NOTE — Progress Notes (Addendum)
Cardiology Clinic Note   Patient Name: [name] Date of Encounter: 07/05/2020  Primary Care Provider:  Horald Pollen, MD Primary Cardiologist:  Sanda Klein, MD  Patient Profile    [name] 79 year old female presents to the clinic today for follow-up evaluation of her coronary artery disease, essential hypertension, and preoperative cardiac evaluation.  Past Medical History    Past Medical History:  Diagnosis Date  . Abdominal discomfort    "due to medication intolerance"  . CAD (coronary artery disease)    previous stent  . Chronic pain syndrome 09/10/2013   Dr Nelva Bush,   . [place] Patient virus infection 1988  . Excessive daytime sleepiness 12/05/2014   Patient has not been seen for a sleep study as ordered, and used adderall to keep alert in daytime.  She agreed  In a contract not to have any scheduled medication for pain treatment from [facility] and will not receive refills for Adderall, which was initiated by dr Orland Penman, PCP>   . Fall   . Hyperlipemia   . Hypersomnia, persistent 09/10/2013   Patient on  Stimulants .  Marland Kitchen Hypertension   . Narcotic addiction ([place]) 12/05/2014  . Obesity, unspecified 09/10/2013  . Shoulder joint pain    both shoulders  . Stroke [facility])    Past Surgical History:  Procedure Laterality Date  . ANGIOPLASTY  03/01/2002   cutting balloon mid RCA  . BACK SURGERY    . CARDIAC CATHETERIZATION  01/26/2007   mild diffuse CAD  . CARDIAC CATHETERIZATION  10/23/2009   nonobstructive CAD,60% prox RCA,50% prox LAD, 50% mid ramus  . CORONARY STENT PLACEMENT  03/15/2005   distal RCA  . NEPHRECTOMY    . TEE WITHOUT CARDIOVERSION N/A 07/03/2018   Procedure: TRANSESOPHAGEAL ECHOCARDIOGRAM (TEE);  Surgeon: Skeet Latch, MD;  Location: [facility] ENDOSCOPY;  Service: Cardiovascular;  Laterality: N/A;    Allergies  Allergies  Allergen Reactions  . Azithromycin Nausea And Vomiting  . Cardizem [Diltiazem Hcl] Nausea And Vomiting  . Codeine Other (See  Comments)    Made the patient "feel weird"   . Coreg [Carvedilol] Nausea And Vomiting  . Demerol [Meperidine] Other (See Comments)    Disorientation   . Erythromycin Nausea And Vomiting and Other (See Comments)    Made the patient "very sick"  . Inderal [Propranolol] Other (See Comments)    "Made me cry"  . Procardia [Nifedipine] Other (See Comments)    "Disoriented and sick"  . Wellbutrin [Bupropion] Nausea Only  . Zoloft [Sertraline Hcl] Other (See Comments)    Could not talk, made the patient stiff    History of Present Illness    [name] has a PMH of coronary artery disease status post PCI with stenting to her RCA in 2006, hypertension, acute CVA, chronic diastolic heart failure, atrial fibrillation, hypotension, diabetes, hypothyroidism, anxiety, depression, obesity, narcotic addiction, ADD, and sepsis.  She was last seen by cardiology on 07/04/2018 after having ischemic stroke 6/19 with residual slurred speech.  She was found to have new onset A. fib with RVR.  She underwent TEE 07/03/2018 and was found to have LA thrombus.  She did not undergo cardioversion.  She was started on Eliquis, Lopressor.  Her chads vas score was 7.  Due to her cerebral atherosclerosis neurology recommended aspirin and Eliquis at that time.  A plan was made to reevaluate for DCCV after 4 weeks of anticoagulation.  She would need a repeat TEE to check for residual thrombus.  She  was lost to follow-up.  She presents to the clinic today for follow-up evaluation and preoperative cardiac evaluation.  She states she feels well.  She is concerned about her upcoming dental procedure and needs to have four teeth extracted.  Her husband accompanies her on the phone and states that he is concerned about her having sedation for the procedure.  She remains physically active but [employer] and states she is eating well.  She denies chest discomfort and cardiac awareness.  Her EKG today shows that she has maintained rate  control with atrial fibrillation.  Her blood pressure is also well controlled at 130/62.  I will have her follow-up with Dr. Sallyanne Kuster in 12 months, maintain her physical activity, and low-sodium diet.  No medication changes are needed at this time.  Today she denies chest pain, shortness of breath, lower extremity edema, fatigue, palpitations, melena, hematuria, hemoptysis, diaphoresis, weakness, presyncope, syncope, orthopnea, and PND.   Home Medications    Prior to Admission medications   Medication Sig Start Date End Date Taking? Authorizing Provider  ACCU-CHEK SOFTCLIX LANCETS lancets USE TO TEST FOUR TIMES DAILY Patient taking differently: 1 each by Other route See admin instructions. Use as directed to test blood sugar four times daily. 04/29/18   McVey, Gelene Mink, PA-C  acetaminophen (TYLENOL) 500 MG tablet Take 1,000 mg by mouth 2 (two) times daily.    [provider]  aspirin EC 81 MG EC tablet Take 1 tablet (81 mg total) by mouth daily. 07/06/18   Hongalgi, Lenis Dickinson, MD  atorvastatin (LIPITOR) 40 MG tablet TAKE 1 TABLET BY MOUTH ONCE DAILY AT  6PM Patient taking differently: Take 40 mg by mouth daily.  06/18/19   Rutherford Guys, MD  blood glucose meter kit and supplies Dispense based on patient and insurance preference. Use up to four times daily as directed. (FOR ICD-9 250.00, 250.01). Patient taking differently: 1 each by Other route See admin instructions. Use up to four times daily as directed. (FOR ICD-9 250.00, 250.01). 09/27/17   Oswald Hillock, MD  calcium carbonate (TUMS EX) 750 MG chewable tablet Chew 2 tablets by mouth daily.    [provider]  Cholecalciferol (VITAMIN D3) 5000 units TBDP Take 5,000 Units by mouth daily.     [provider]  cyclobenzaprine (FLEXERIL) 5 MG tablet Take 1 tablet (5 mg total) by mouth 3 (three) times daily as needed for muscle spasms. 09/28/19   Barb Merino, MD  ELIQUIS 5 MG TABS tablet Take 1 tablet by mouth  twice daily Patient taking differently: Take 5 mg by mouth 2 (two) times daily.  06/21/19   Croitoru, Mihai, MD  fexofenadine (ALLEGRA) 180 MG tablet Take 180 mg by mouth daily as needed for allergies.     [provider]  gabapentin (NEURONTIN) 100 MG capsule Take 2 capsules (200 mg total) by mouth 3 (three) times daily. Patient taking differently: Take 300 mg by mouth 3 (three) times daily.  09/28/19   Barb Merino, MD  glucose blood (ACCU-CHEK AVIVA PLUS) test strip USE TO TEST FOUR TIMES DAILY Patient taking differently: 1 each by Other route See admin instructions. Use as directed to test four times daily. 12/02/17   McVey, Gelene Mink, PA-C  ibuprofen (ADVIL) 200 MG tablet Take 400-600 mg by mouth every 4 (four) hours as needed for moderate pain.     [provider]  insulin aspart (NOVOLOG) 100 UNIT/ML injection Inject 8 Units into the skin 3 (three) times  daily with meals. Patient taking differently: Inject 0-12 Units into the skin 3 (three) times daily before meals. 70-200=0 units 201-250=2 units 251-300=4 units 301-350=6 units 351-400=8 units 401-450=10 units 451-700=12 units 09/28/19   Dorcas Carrow, MD  Insulin Syringe-Needle U-100 (INSULIN SYRINGE .5CC/31GX5/16") 31G X 5/16" 0.5 ML MISC Use as directed Patient taking differently: 1 application by Other route See admin instructions. Use as directed 09/27/17   Meredeth Ide, MD  LANTUS SOLOSTAR 100 UNIT/ML Solostar Pen Inject 20 Units into the skin at bedtime. Patient taking differently: Inject 28 Units into the skin at bedtime.  09/28/19   Dorcas Carrow, MD  levothyroxine (SYNTHROID) 50 MCG tablet TAKE 1 TABLET BY MOUTH ONCE DAILY BEFORE BREAKFAST Patient taking differently: Take 50 mcg by mouth daily before breakfast.  08/25/19   Sagardia, Eilleen Kempf, MD  lidocaine (LIDODERM) 5 % Place 1 patch onto the skin daily. Remove & Discard patch within 12 hours or as directed by MD 09/28/19   Dorcas Carrow, MD    lidocaine (LMX) 4 % cream Apply 1 application topically 3 (three) times daily as needed (pain).    [provider]  magnesium oxide (MAG-OX) 400 MG tablet Take 800 mg by mouth 2 (two) times daily.     [provider]  Menthol, Topical Analgesic, (BIOFREEZE) 4 % GEL Apply 1 application topically at bedtime.    [provider]  metoprolol tartrate (LOPRESSOR) 50 MG tablet Take 1 tablet by mouth twice daily Patient taking differently: Take 50 mg by mouth 2 (two) times daily.  07/11/19   Georgina Quint, MD  Misc. Devices (HUGO ROLLING WALKER ELITE) MISC 1 Units by Does not apply route daily as needed. Patient taking differently: 1 Units by Other route daily as needed (walking).  11/29/16   McVey, Madelaine Bhat, PA-C  oxyCODONE (OXY IR/ROXICODONE) 5 MG immediate release tablet Take 2.5 mg by mouth every 8 (eight) hours as needed for severe pain.    [provider]  potassium chloride (KLOR-CON) 10 MEQ tablet TAKE 1  BY MOUTH ONCE DAILY Patient taking differently: Take 10 mEq by mouth daily.  08/25/19   Georgina Quint, MD  venlafaxine XR (EFFEXOR XR) 150 MG 24 hr capsule Take 1 capsule (150 mg total) by mouth daily with breakfast. 09/30/18   McVey, Madelaine Bhat, PA-C    Family History    Family History  Problem Relation Age of Onset  . Hypertension Mother   . Diabetes Father   . Heart attack Father   . Heart attack Brother   . Heart attack Brother    She indicated that her mother is deceased. She indicated that her father is deceased. She indicated that both of her brothers are deceased. She indicated that her maternal grandmother is deceased. She indicated that her maternal grandfather is deceased. She indicated that her paternal grandmother is deceased. She indicated that her paternal grandfather is deceased.  Social History    Social History   Socioeconomic History  . Marital status: Married    Spouse name: Lynnsie Linders  . Number  of children: 1  . Years of education: 62  . Highest education level: High school graduate  Occupational History  . Occupation: N/A  Tobacco Use  . Smoking status: Never Smoker  . Smokeless tobacco: Never Used  Vaping Use  . Vaping Use: Never used  Substance and Sexual Activity  . Alcohol use: No  . Drug use: No  . Sexual activity: Not Currently  Other  Topics Concern  . Not on file  Social History Narrative   Right-handed   Caffeine: occasional cup of coffee in the morning   Full assessment completed by patient's husband.   Social Determinants of Health   Financial Resource Strain:   . Difficulty of Paying Living Expenses:   Food Insecurity:   . Worried About Charity fundraiser in the Last Year:   . Arboriculturist in the Last Year:   Transportation Needs:   . Film/video editor (Medical):   Marland Kitchen Lack of Transportation (Non-Medical):   Physical Activity:   . Days of Exercise per Week:   . Minutes of Exercise per Session:   Stress:   . Feeling of Stress :   Social Connections:   . Frequency of Communication with Friends and Family:   . Frequency of Social Gatherings with Friends and Family:   . Attends Religious Services:   . Active Member of Clubs or Organizations:   . Attends Archivist Meetings:   Marland Kitchen Marital Status:   Intimate Partner Violence:   . Fear of Current or Ex-Partner:   . Emotionally Abused:   Marland Kitchen Physically Abused:   . Sexually Abused:      Review of Systems    General:  No chills, fever, night sweats or weight changes.  Cardiovascular:  No chest pain, dyspnea on exertion, edema, orthopnea, palpitations, paroxysmal nocturnal dyspnea. Dermatological: No rash, lesions/masses Respiratory: No cough, dyspnea Urologic: No hematuria, dysuria 2-week abdominal:   No nausea, vomiting, diarrhea, bright red blood per rectum, melena, or hematemesis Neurologic:  No visual changes, wkns, changes in mental status. All other systems reviewed and are  otherwise negative except as noted above.  Physical Exam    VS:  BP 130/62   Ht '5\' 3"'$  (1.6 m)   BMI 30.07 kg/m  , BMI Body mass index is 30.07 kg/m. GEN: Well nourished, well developed, in no acute distress. HEENT: normal. Neck: Supple, no JVD, carotid bruits, or masses. Cardiac: RRR, no murmurs, rubs, or gallops. No clubbing, cyanosis, edema.  Radials/DP/PT 2+ and equal bilaterally.  Respiratory:  Respirations regular and unlabored, clear to auscultation bilaterally. GI: Soft, nontender, nondistended, BS + x 4. MS: no deformity or atrophy. Skin: warm and dry, no rash. Neuro:  Strength and sensation are intact. Psych: Normal affect.  Accessory Clinical Findings    Recent Labs: 09/17/2019: TSH 0.499 09/22/2019: Magnesium 2.0 12/21/2019: ALT 17; BUN 17; Creatinine, Ser 0.90; Hemoglobin 11.7; Platelets 321; Potassium 4.1; Sodium 139   Recent Lipid Panel    Component Value Date/Time   CHOL 92 09/17/2019 0245   CHOL 346 (H) 09/23/2017 1803   TRIG 99 09/17/2019 0245   HDL 31 (L) 09/17/2019 0245   HDL 56 09/23/2017 1803   CHOLHDL 3.0 09/17/2019 0245   VLDL 20 09/17/2019 0245   LDLCALC 41 09/17/2019 0245   LDLCALC Comment 09/23/2017 1803    ECG personally reviewed by me today-atrial fibrillation 95 bpm  EKG 12/23/2019 Atrial fibrillation 91 bpm  Echocardiogram 09/17/2019  IMPRESSIONS    1. Left ventricular ejection fraction, by visual estimation, is 65-70%.  The left ventricle has normal function. Normal left ventricular size. Left  ventricular septal wall thickness was severely increased. Severely  increased left ventricular posterior  wall thickness.  2. Definity contrast agent was given IV to delineate the left ventricular  endocardial borders.  3. Left ventricular diastolic Doppler parameters are indeterminate  pattern of LV diastolic filling. The left ventricular  diastology could not  be evaluatedsecondary to atrial fibrillation. .  4. There is a dynamic  mid LV cavitary gradient measuring 35mHg at rest.  5. Global right ventricle has normal systolic function.The right  ventricular size is normal. No increase in right ventricular wall  thickness.  6. Left atrial size was mildly dilated.  7. Right atrial size was normal.  8. Mild to moderate mitral annular calcification.. Trace mitral valve  regurgitation. No evidence of mitral stenosis.  9. The tricuspid valve is normal in structure. Tricuspid valve  regurgitation is trivial.  10. The aortic valve was not well visualized. Aortic valve regurgitation  was not visualized by color flow Doppler. Structurally normal aortic  valve, with no evidence of sclerosis or stenosis.  11. The pulmonic valve was normal in structure. Pulmonic valve  regurgitation is not visualized by color flow Doppler.  12. Normal pulmonary artery systolic pressure.  13. The inferior vena cava is normal in size with greater than 50%  respiratory variability, suggesting right atrial pressure of 3 mmHg.    Assessment & Plan   1.  Atrial fibrillation-EKG today shows atrial fibrillation 95 bpm.  Atrial fibrillation identified 8/19 post CVA.  Did not undergo DCCV initially due to LA thrombus and was lost to follow-up. Continue aspirin, Eliquis, metoprolol Heart healthy low-sodium diet-salty 6 given Increase physical activity as tolerated Avoid triggers caffeine, chocolate, EtOH etc.    Essential hypertension-BP today 130/62.  Well-controlled at home Continue metoprolol.   Heart healthy low-sodium diet-salty 6 given Increase physical activity as tolerated  Coronary artery disease-no chest pain today.  Underwent PCI with stenting to her distal RCA in 2006. Continue aspirin, metoprolol, atorvastatin Heart healthy low-sodium diet-salty 6 given Increase physical activity as tolerated   Hyperlipidemia-09/17/2019: Cholesterol 92; HDL 31; LDL Cholesterol 41; Triglycerides 99; VLDL 20 Continue atorvastatin Heart  healthy low-sodium high-fiber diet Increase physical activity as tolerated  Disposition follow-up with Dr. CSallyanne Kusterin 12 months.  Preoperative cardiovascular evaluation-4 teeth extraction TIC dental implants and oral and maxillofacial surgery [phone]    Primary Cardiologist: MSanda Klein MD  Chart reviewed as part of pre-operative protocol coverage. Given past medical history and time since last visit, based on ACC/AHA guidelines, JETHELINE GEPPERTwould be at acceptable risk for the planned procedure without further cardiovascular testing.   Her RCRI is a class IV risk, 11% risk of major cardiac event.  She is able to complete greater than 4 METS of physical activity.  Patient with diagnosis of afib on Eliquis for anticoagulation.    Procedure: 4 dental extractions Date of procedure: TBD  CHADS2-VASc score of 933(age x2, sex, CHF, HTN, DM, CAD, multiple CVAs, most recently October 2020)  CrCl 671mmin Platelet count 321K  Pt is at very elevated cardiac risk off of her Eliquis. Recommend holding for 1 day and resume as soon as possible after extractions.  I will route this recommendation to the requesting party via Epic fax function and remove from pre-op pool.  Please call with questions.  Disposition: Follow-up with Dr. CrSallyanne Kustern 12 months.  JeJossie NgCleaver NP-C    07/05/2020, 12:04 PM [email or web address] Office ([phone]: This dictation was prepared with Dragon dictation along with smaller phrase technology. Any transcriptional errors that result from this process are unintentional and may not be corrected upon review.

## 2020-07-05 ENCOUNTER — Other Ambulatory Visit: Payer: Self-pay

## 2020-07-05 ENCOUNTER — Encounter: Payer: Self-pay | Admitting: General Practice

## 2020-07-05 ENCOUNTER — Ambulatory Visit (INDEPENDENT_AMBULATORY_CARE_PROVIDER_SITE_OTHER): Payer: Medicare Other | Admitting: General Practice

## 2020-07-05 ENCOUNTER — Telehealth: Payer: Self-pay | Admitting: General Practice

## 2020-07-05 VITALS — BP 130/62 | Ht 63.0 in

## 2020-07-05 DIAGNOSIS — I10 Essential (primary) hypertension: Secondary | ICD-10-CM | POA: Diagnosis not present

## 2020-07-05 DIAGNOSIS — I4891 Unspecified atrial fibrillation: Secondary | ICD-10-CM

## 2020-07-05 DIAGNOSIS — E7800 Pure hypercholesterolemia, unspecified: Secondary | ICD-10-CM | POA: Diagnosis not present

## 2020-07-05 DIAGNOSIS — Z01810 Encounter for preprocedural cardiovascular examination: Secondary | ICD-10-CM

## 2020-07-05 DIAGNOSIS — I2510 Atherosclerotic heart disease of native coronary artery without angina pectoris: Secondary | ICD-10-CM | POA: Diagnosis not present

## 2020-07-05 NOTE — Patient Instructions (Signed)
Medication Instructions:  No Changes In Medications at this time.   *If you need a refill on your cardiac medications before your next appointment, please call your pharmacy*   Lab Work: None Ordered At This Time.   If you have labs (blood work) drawn today and your tests are completely normal, you will receive your results only by: Marland Kitchen MyChart Message (if you have MyChart) OR . A paper copy in the mail If you have any lab test that is abnormal or we need to change your treatment, we will call you to review the results.   Testing/Procedures: None Ordered At This Time.     Follow-Up: At [facility], you and your health needs are our priority.  As part of our continuing mission to provide you with exceptional heart care, we have created designated Provider Care Teams.  These Care Teams include your primary Cardiologist (physician) and Advanced Practice Providers (APPs -  Physician Assistants and Nurse Practitioners) who all work together to provide you with the care you need, when you need it.  We recommend signing up for the patient portal called "MyChart".  Sign up information is provided on this After Visit Summary.  MyChart is used to connect with patients for Virtual Visits (Telemedicine).  Patients are able to view lab/test results, encounter notes, upcoming appointments, etc.  Non-urgent messages can be sent to your provider as well.   To learn more about what you can do with MyChart, go to [email or web address].    Your next appointment:   12 month(s)  The format for your next appointment:   In Person  Provider:   Thurmon Fair, MD   Other Instructions Cleared for surgery.

## 2020-07-05 NOTE — Telephone Encounter (Signed)
     Primary Cardiologist: Thurmon Fair, MD  Chart reviewed as part of pre-operative protocol coverage. Given past medical history and time since last visit, based on ACC/AHA guidelines, [name] would be at acceptable risk for the planned procedure without further cardiovascular testing.   Her RCRI is a class IV risk, 11% risk of major cardiac event.  She is able to complete greater than 4 METS of physical activity.  Patient with diagnosis ofafibon Eliquisfor anticoagulation.   Procedure:4 dental extractions Date of procedure:TBD  CHADS2-VASc score of9 (age x2, sex, CHF, HTN, DM, CAD, multiple CVAs, most recently October 2020)  CrCl31mL/min Platelet count321K  Pt is at very elevated cardiac risk off of her Eliquis. Recommend holding for 1 day and resume as soon as possible after extractions.  I will route this recommendation to the requesting party via Epic fax function and remove from pre-op pool.  Please call with questions.   Thomasene Ripple. Keily Lepp NP-C    07/05/2020, 12:04 PM [facility] [address] Office [phone] Fax [phone]

## 2020-07-05 NOTE — Progress Notes (Signed)
Agree with very brief (24h) interruption of Eliquis.

## 2020-07-06 ENCOUNTER — Telehealth: Payer: Self-pay

## 2020-07-06 DIAGNOSIS — I6330 Cerebral infarction due to thrombosis of unspecified cerebral artery: Secondary | ICD-10-CM | POA: Diagnosis not present

## 2020-07-06 DIAGNOSIS — Z20822 Contact with and (suspected) exposure to covid-19: Secondary | ICD-10-CM | POA: Diagnosis not present

## 2020-07-06 DIAGNOSIS — I69354 Hemiplegia and hemiparesis following cerebral infarction affecting left non-dominant side: Secondary | ICD-10-CM | POA: Diagnosis not present

## 2020-07-06 DIAGNOSIS — R1312 Dysphagia, oropharyngeal phase: Secondary | ICD-10-CM | POA: Diagnosis not present

## 2020-07-06 DIAGNOSIS — E440 Moderate protein-calorie malnutrition: Secondary | ICD-10-CM | POA: Diagnosis not present

## 2020-07-06 DIAGNOSIS — R2689 Other abnormalities of gait and mobility: Secondary | ICD-10-CM | POA: Diagnosis not present

## 2020-07-06 DIAGNOSIS — R278 Other lack of coordination: Secondary | ICD-10-CM | POA: Diagnosis not present

## 2020-07-06 DIAGNOSIS — I69054 Hemiplegia and hemiparesis following nontraumatic subarachnoid hemorrhage affecting left non-dominant side: Secondary | ICD-10-CM | POA: Diagnosis not present

## 2020-07-06 DIAGNOSIS — R41841 Cognitive communication deficit: Secondary | ICD-10-CM | POA: Diagnosis not present

## 2020-07-06 DIAGNOSIS — R4701 Aphasia: Secondary | ICD-10-CM | POA: Diagnosis not present

## 2020-07-06 DIAGNOSIS — M6281 Muscle weakness (generalized): Secondary | ICD-10-CM | POA: Diagnosis not present

## 2020-07-06 DIAGNOSIS — E1159 Type 2 diabetes mellitus with other circulatory complications: Secondary | ICD-10-CM | POA: Diagnosis not present

## 2020-07-06 NOTE — Telephone Encounter (Addendum)
   Primary Cardiologist: Thurmon Fair, MD  Pt seen in the office yesterday and cleared.  I will send clearance again.  Corine Shelter PA-C 07/06/2020 11:15 AM   Corine Shelter, PA-C 07/06/2020, 11:11 AM

## 2020-07-06 NOTE — Telephone Encounter (Signed)
   [facility] Pre-operative Risk Assessment    HEARTCARE STAFF: - Please ensure there is not already an duplicate clearance open for this procedure. - Under Visit Info/Reason for Call, type in Other and utilize the format Clearance MM/DD/YY or Clearance TBD. Do not use dashes or single digits. - If request is for dental extraction, please clarify the # of teeth to be extracted.  Request for surgical clearance:  1. What type of surgery is being performed? Four teeth extractions  2. When is this surgery scheduled? tbd   3. What type of clearance is required (medical clearance vs. Pharmacy clearance to hold med vs. Both)? both  4. Are there any medications that need to be held prior to surgery and how long? Asa, eliquis  5. Practice name and name of physician performing surgery? [facility]  6. What is the office phone number? [phone]   7.   What is the office fax number? [phone]  8.   Anesthesia type (None, local, MAC, general) ? choice   Darlyn Chamber Io Dieujuste 07/06/2020, 9:05 AM  _________________________________________________________________   (provider comments below)

## 2020-07-06 NOTE — Telephone Encounter (Signed)
Daughter of the patient called. The patient may be able to have her teeth extracted tomorrow if the office approves the clearance today.

## 2020-07-12 DIAGNOSIS — Z20822 Contact with and (suspected) exposure to covid-19: Secondary | ICD-10-CM | POA: Diagnosis not present

## 2020-07-14 DIAGNOSIS — I480 Paroxysmal atrial fibrillation: Secondary | ICD-10-CM | POA: Diagnosis not present

## 2020-07-14 DIAGNOSIS — Z9889 Other specified postprocedural states: Secondary | ICD-10-CM | POA: Diagnosis not present

## 2020-07-17 DIAGNOSIS — Z20822 Contact with and (suspected) exposure to covid-19: Secondary | ICD-10-CM | POA: Diagnosis not present

## 2020-07-21 DIAGNOSIS — Z20822 Contact with and (suspected) exposure to covid-19: Secondary | ICD-10-CM | POA: Diagnosis not present

## 2020-07-24 DIAGNOSIS — Z20822 Contact with and (suspected) exposure to covid-19: Secondary | ICD-10-CM | POA: Diagnosis not present

## 2020-07-26 DIAGNOSIS — I6330 Cerebral infarction due to thrombosis of unspecified cerebral artery: Secondary | ICD-10-CM | POA: Diagnosis not present

## 2020-07-26 DIAGNOSIS — I69054 Hemiplegia and hemiparesis following nontraumatic subarachnoid hemorrhage affecting left non-dominant side: Secondary | ICD-10-CM | POA: Diagnosis not present

## 2020-07-26 DIAGNOSIS — R41841 Cognitive communication deficit: Secondary | ICD-10-CM | POA: Diagnosis not present

## 2020-07-26 DIAGNOSIS — E1159 Type 2 diabetes mellitus with other circulatory complications: Secondary | ICD-10-CM | POA: Diagnosis not present

## 2020-07-26 DIAGNOSIS — R1312 Dysphagia, oropharyngeal phase: Secondary | ICD-10-CM | POA: Diagnosis not present

## 2020-07-26 DIAGNOSIS — E440 Moderate protein-calorie malnutrition: Secondary | ICD-10-CM | POA: Diagnosis not present

## 2020-07-26 DIAGNOSIS — R293 Abnormal posture: Secondary | ICD-10-CM | POA: Diagnosis not present

## 2020-07-26 DIAGNOSIS — R4701 Aphasia: Secondary | ICD-10-CM | POA: Diagnosis not present

## 2020-07-26 DIAGNOSIS — M6281 Muscle weakness (generalized): Secondary | ICD-10-CM | POA: Diagnosis not present

## 2020-07-26 DIAGNOSIS — G894 Chronic pain syndrome: Secondary | ICD-10-CM | POA: Diagnosis not present

## 2020-07-26 DIAGNOSIS — R278 Other lack of coordination: Secondary | ICD-10-CM | POA: Diagnosis not present

## 2020-07-26 DIAGNOSIS — I69354 Hemiplegia and hemiparesis following cerebral infarction affecting left non-dominant side: Secondary | ICD-10-CM | POA: Diagnosis not present

## 2020-07-26 DIAGNOSIS — R2689 Other abnormalities of gait and mobility: Secondary | ICD-10-CM | POA: Diagnosis not present

## 2020-07-27 DIAGNOSIS — Z20822 Contact with and (suspected) exposure to covid-19: Secondary | ICD-10-CM | POA: Diagnosis not present

## 2020-08-01 DIAGNOSIS — Z20822 Contact with and (suspected) exposure to covid-19: Secondary | ICD-10-CM | POA: Diagnosis not present

## 2020-08-03 DIAGNOSIS — I69354 Hemiplegia and hemiparesis following cerebral infarction affecting left non-dominant side: Secondary | ICD-10-CM | POA: Diagnosis not present

## 2020-08-03 DIAGNOSIS — I6330 Cerebral infarction due to thrombosis of unspecified cerebral artery: Secondary | ICD-10-CM | POA: Diagnosis not present

## 2020-08-03 DIAGNOSIS — R293 Abnormal posture: Secondary | ICD-10-CM | POA: Diagnosis not present

## 2020-08-03 DIAGNOSIS — M6281 Muscle weakness (generalized): Secondary | ICD-10-CM | POA: Diagnosis not present

## 2020-08-03 DIAGNOSIS — I69054 Hemiplegia and hemiparesis following nontraumatic subarachnoid hemorrhage affecting left non-dominant side: Secondary | ICD-10-CM | POA: Diagnosis not present

## 2020-08-03 DIAGNOSIS — G894 Chronic pain syndrome: Secondary | ICD-10-CM | POA: Diagnosis not present

## 2020-08-03 DIAGNOSIS — U071 COVID-19: Secondary | ICD-10-CM | POA: Diagnosis not present

## 2020-08-04 DIAGNOSIS — F329 Major depressive disorder, single episode, unspecified: Secondary | ICD-10-CM | POA: Diagnosis not present

## 2020-08-04 DIAGNOSIS — I5032 Chronic diastolic (congestive) heart failure: Secondary | ICD-10-CM | POA: Diagnosis not present

## 2020-08-04 DIAGNOSIS — E1151 Type 2 diabetes mellitus with diabetic peripheral angiopathy without gangrene: Secondary | ICD-10-CM | POA: Diagnosis not present

## 2020-08-04 DIAGNOSIS — I69054 Hemiplegia and hemiparesis following nontraumatic subarachnoid hemorrhage affecting left non-dominant side: Secondary | ICD-10-CM | POA: Diagnosis not present

## 2020-08-04 DIAGNOSIS — I69354 Hemiplegia and hemiparesis following cerebral infarction affecting left non-dominant side: Secondary | ICD-10-CM | POA: Diagnosis not present

## 2020-08-04 DIAGNOSIS — G894 Chronic pain syndrome: Secondary | ICD-10-CM | POA: Diagnosis not present

## 2020-08-04 DIAGNOSIS — I6330 Cerebral infarction due to thrombosis of unspecified cerebral artery: Secondary | ICD-10-CM | POA: Diagnosis not present

## 2020-08-04 DIAGNOSIS — E039 Hypothyroidism, unspecified: Secondary | ICD-10-CM | POA: Diagnosis not present

## 2020-08-04 DIAGNOSIS — R293 Abnormal posture: Secondary | ICD-10-CM | POA: Diagnosis not present

## 2020-08-04 DIAGNOSIS — E782 Mixed hyperlipidemia: Secondary | ICD-10-CM | POA: Diagnosis not present

## 2020-08-04 DIAGNOSIS — M6281 Muscle weakness (generalized): Secondary | ICD-10-CM | POA: Diagnosis not present

## 2020-08-04 DIAGNOSIS — I4891 Unspecified atrial fibrillation: Secondary | ICD-10-CM | POA: Diagnosis not present

## 2020-08-04 DIAGNOSIS — I10 Essential (primary) hypertension: Secondary | ICD-10-CM | POA: Diagnosis not present

## 2020-08-04 DIAGNOSIS — G8929 Other chronic pain: Secondary | ICD-10-CM | POA: Diagnosis not present

## 2020-08-07 DIAGNOSIS — E782 Mixed hyperlipidemia: Secondary | ICD-10-CM | POA: Diagnosis not present

## 2020-08-07 DIAGNOSIS — E1151 Type 2 diabetes mellitus with diabetic peripheral angiopathy without gangrene: Secondary | ICD-10-CM | POA: Diagnosis not present

## 2020-08-07 DIAGNOSIS — G894 Chronic pain syndrome: Secondary | ICD-10-CM | POA: Diagnosis not present

## 2020-08-07 DIAGNOSIS — F329 Major depressive disorder, single episode, unspecified: Secondary | ICD-10-CM | POA: Diagnosis not present

## 2020-08-07 DIAGNOSIS — I10 Essential (primary) hypertension: Secondary | ICD-10-CM | POA: Diagnosis not present

## 2020-08-07 DIAGNOSIS — I5032 Chronic diastolic (congestive) heart failure: Secondary | ICD-10-CM | POA: Diagnosis not present

## 2020-08-07 DIAGNOSIS — E039 Hypothyroidism, unspecified: Secondary | ICD-10-CM | POA: Diagnosis not present

## 2020-08-07 DIAGNOSIS — I6330 Cerebral infarction due to thrombosis of unspecified cerebral artery: Secondary | ICD-10-CM | POA: Diagnosis not present

## 2020-08-07 DIAGNOSIS — I69054 Hemiplegia and hemiparesis following nontraumatic subarachnoid hemorrhage affecting left non-dominant side: Secondary | ICD-10-CM | POA: Diagnosis not present

## 2020-08-07 DIAGNOSIS — G8929 Other chronic pain: Secondary | ICD-10-CM | POA: Diagnosis not present

## 2020-08-07 DIAGNOSIS — I4891 Unspecified atrial fibrillation: Secondary | ICD-10-CM | POA: Diagnosis not present

## 2020-08-07 DIAGNOSIS — R293 Abnormal posture: Secondary | ICD-10-CM | POA: Diagnosis not present

## 2020-08-07 DIAGNOSIS — I69354 Hemiplegia and hemiparesis following cerebral infarction affecting left non-dominant side: Secondary | ICD-10-CM | POA: Diagnosis not present

## 2020-08-07 DIAGNOSIS — M6281 Muscle weakness (generalized): Secondary | ICD-10-CM | POA: Diagnosis not present

## 2020-08-08 DIAGNOSIS — I6330 Cerebral infarction due to thrombosis of unspecified cerebral artery: Secondary | ICD-10-CM | POA: Diagnosis not present

## 2020-08-08 DIAGNOSIS — L219 Seborrheic dermatitis, unspecified: Secondary | ICD-10-CM | POA: Diagnosis not present

## 2020-08-08 DIAGNOSIS — G894 Chronic pain syndrome: Secondary | ICD-10-CM | POA: Diagnosis not present

## 2020-08-08 DIAGNOSIS — I69354 Hemiplegia and hemiparesis following cerebral infarction affecting left non-dominant side: Secondary | ICD-10-CM | POA: Diagnosis not present

## 2020-08-08 DIAGNOSIS — R293 Abnormal posture: Secondary | ICD-10-CM | POA: Diagnosis not present

## 2020-08-08 DIAGNOSIS — M6281 Muscle weakness (generalized): Secondary | ICD-10-CM | POA: Diagnosis not present

## 2020-08-08 DIAGNOSIS — I10 Essential (primary) hypertension: Secondary | ICD-10-CM | POA: Diagnosis not present

## 2020-08-08 DIAGNOSIS — E118 Type 2 diabetes mellitus with unspecified complications: Secondary | ICD-10-CM | POA: Diagnosis not present

## 2020-08-08 DIAGNOSIS — I5032 Chronic diastolic (congestive) heart failure: Secondary | ICD-10-CM | POA: Diagnosis not present

## 2020-08-08 DIAGNOSIS — Z20822 Contact with and (suspected) exposure to covid-19: Secondary | ICD-10-CM | POA: Diagnosis not present

## 2020-08-08 DIAGNOSIS — I69054 Hemiplegia and hemiparesis following nontraumatic subarachnoid hemorrhage affecting left non-dominant side: Secondary | ICD-10-CM | POA: Diagnosis not present

## 2020-08-09 DIAGNOSIS — I69054 Hemiplegia and hemiparesis following nontraumatic subarachnoid hemorrhage affecting left non-dominant side: Secondary | ICD-10-CM | POA: Diagnosis not present

## 2020-08-09 DIAGNOSIS — I69354 Hemiplegia and hemiparesis following cerebral infarction affecting left non-dominant side: Secondary | ICD-10-CM | POA: Diagnosis not present

## 2020-08-09 DIAGNOSIS — M6281 Muscle weakness (generalized): Secondary | ICD-10-CM | POA: Diagnosis not present

## 2020-08-09 DIAGNOSIS — G894 Chronic pain syndrome: Secondary | ICD-10-CM | POA: Diagnosis not present

## 2020-08-09 DIAGNOSIS — R293 Abnormal posture: Secondary | ICD-10-CM | POA: Diagnosis not present

## 2020-08-09 DIAGNOSIS — I6330 Cerebral infarction due to thrombosis of unspecified cerebral artery: Secondary | ICD-10-CM | POA: Diagnosis not present

## 2020-08-10 DIAGNOSIS — M6281 Muscle weakness (generalized): Secondary | ICD-10-CM | POA: Diagnosis not present

## 2020-08-10 DIAGNOSIS — I69354 Hemiplegia and hemiparesis following cerebral infarction affecting left non-dominant side: Secondary | ICD-10-CM | POA: Diagnosis not present

## 2020-08-10 DIAGNOSIS — R293 Abnormal posture: Secondary | ICD-10-CM | POA: Diagnosis not present

## 2020-08-10 DIAGNOSIS — G894 Chronic pain syndrome: Secondary | ICD-10-CM | POA: Diagnosis not present

## 2020-08-10 DIAGNOSIS — I6330 Cerebral infarction due to thrombosis of unspecified cerebral artery: Secondary | ICD-10-CM | POA: Diagnosis not present

## 2020-08-10 DIAGNOSIS — G8929 Other chronic pain: Secondary | ICD-10-CM | POA: Diagnosis not present

## 2020-08-10 DIAGNOSIS — Z20822 Contact with and (suspected) exposure to covid-19: Secondary | ICD-10-CM | POA: Diagnosis not present

## 2020-08-10 DIAGNOSIS — I69054 Hemiplegia and hemiparesis following nontraumatic subarachnoid hemorrhage affecting left non-dominant side: Secondary | ICD-10-CM | POA: Diagnosis not present

## 2020-08-10 DIAGNOSIS — L218 Other seborrheic dermatitis: Secondary | ICD-10-CM | POA: Diagnosis not present

## 2020-08-11 DIAGNOSIS — G894 Chronic pain syndrome: Secondary | ICD-10-CM | POA: Diagnosis not present

## 2020-08-11 DIAGNOSIS — I6330 Cerebral infarction due to thrombosis of unspecified cerebral artery: Secondary | ICD-10-CM | POA: Diagnosis not present

## 2020-08-11 DIAGNOSIS — M6281 Muscle weakness (generalized): Secondary | ICD-10-CM | POA: Diagnosis not present

## 2020-08-11 DIAGNOSIS — I69054 Hemiplegia and hemiparesis following nontraumatic subarachnoid hemorrhage affecting left non-dominant side: Secondary | ICD-10-CM | POA: Diagnosis not present

## 2020-08-11 DIAGNOSIS — R293 Abnormal posture: Secondary | ICD-10-CM | POA: Diagnosis not present

## 2020-08-11 DIAGNOSIS — I69354 Hemiplegia and hemiparesis following cerebral infarction affecting left non-dominant side: Secondary | ICD-10-CM | POA: Diagnosis not present

## 2020-08-14 DIAGNOSIS — E440 Moderate protein-calorie malnutrition: Secondary | ICD-10-CM | POA: Diagnosis not present

## 2020-08-14 DIAGNOSIS — M6281 Muscle weakness (generalized): Secondary | ICD-10-CM | POA: Diagnosis not present

## 2020-08-14 DIAGNOSIS — I6330 Cerebral infarction due to thrombosis of unspecified cerebral artery: Secondary | ICD-10-CM | POA: Diagnosis not present

## 2020-08-14 DIAGNOSIS — I10 Essential (primary) hypertension: Secondary | ICD-10-CM | POA: Diagnosis not present

## 2020-08-14 DIAGNOSIS — I69054 Hemiplegia and hemiparesis following nontraumatic subarachnoid hemorrhage affecting left non-dominant side: Secondary | ICD-10-CM | POA: Diagnosis not present

## 2020-08-14 DIAGNOSIS — U071 COVID-19: Secondary | ICD-10-CM | POA: Diagnosis not present

## 2020-08-14 DIAGNOSIS — I69354 Hemiplegia and hemiparesis following cerebral infarction affecting left non-dominant side: Secondary | ICD-10-CM | POA: Diagnosis not present

## 2020-08-14 DIAGNOSIS — G894 Chronic pain syndrome: Secondary | ICD-10-CM | POA: Diagnosis not present

## 2020-08-14 DIAGNOSIS — R293 Abnormal posture: Secondary | ICD-10-CM | POA: Diagnosis not present

## 2020-08-14 DIAGNOSIS — E1159 Type 2 diabetes mellitus with other circulatory complications: Secondary | ICD-10-CM | POA: Diagnosis not present

## 2020-08-15 DIAGNOSIS — I69354 Hemiplegia and hemiparesis following cerebral infarction affecting left non-dominant side: Secondary | ICD-10-CM | POA: Diagnosis not present

## 2020-08-15 DIAGNOSIS — I69054 Hemiplegia and hemiparesis following nontraumatic subarachnoid hemorrhage affecting left non-dominant side: Secondary | ICD-10-CM | POA: Diagnosis not present

## 2020-08-15 DIAGNOSIS — I6330 Cerebral infarction due to thrombosis of unspecified cerebral artery: Secondary | ICD-10-CM | POA: Diagnosis not present

## 2020-08-15 DIAGNOSIS — R293 Abnormal posture: Secondary | ICD-10-CM | POA: Diagnosis not present

## 2020-08-15 DIAGNOSIS — G894 Chronic pain syndrome: Secondary | ICD-10-CM | POA: Diagnosis not present

## 2020-08-15 DIAGNOSIS — M6281 Muscle weakness (generalized): Secondary | ICD-10-CM | POA: Diagnosis not present

## 2020-08-16 DIAGNOSIS — I5032 Chronic diastolic (congestive) heart failure: Secondary | ICD-10-CM | POA: Diagnosis not present

## 2020-08-16 DIAGNOSIS — G8929 Other chronic pain: Secondary | ICD-10-CM | POA: Diagnosis not present

## 2020-08-16 DIAGNOSIS — I4891 Unspecified atrial fibrillation: Secondary | ICD-10-CM | POA: Diagnosis not present

## 2020-08-16 DIAGNOSIS — I6330 Cerebral infarction due to thrombosis of unspecified cerebral artery: Secondary | ICD-10-CM | POA: Diagnosis not present

## 2020-08-16 DIAGNOSIS — E782 Mixed hyperlipidemia: Secondary | ICD-10-CM | POA: Diagnosis not present

## 2020-08-16 DIAGNOSIS — R293 Abnormal posture: Secondary | ICD-10-CM | POA: Diagnosis not present

## 2020-08-16 DIAGNOSIS — I69054 Hemiplegia and hemiparesis following nontraumatic subarachnoid hemorrhage affecting left non-dominant side: Secondary | ICD-10-CM | POA: Diagnosis not present

## 2020-08-16 DIAGNOSIS — I10 Essential (primary) hypertension: Secondary | ICD-10-CM | POA: Diagnosis not present

## 2020-08-16 DIAGNOSIS — G894 Chronic pain syndrome: Secondary | ICD-10-CM | POA: Diagnosis not present

## 2020-08-16 DIAGNOSIS — E039 Hypothyroidism, unspecified: Secondary | ICD-10-CM | POA: Diagnosis not present

## 2020-08-16 DIAGNOSIS — E1151 Type 2 diabetes mellitus with diabetic peripheral angiopathy without gangrene: Secondary | ICD-10-CM | POA: Diagnosis not present

## 2020-08-16 DIAGNOSIS — M6281 Muscle weakness (generalized): Secondary | ICD-10-CM | POA: Diagnosis not present

## 2020-08-16 DIAGNOSIS — F329 Major depressive disorder, single episode, unspecified: Secondary | ICD-10-CM | POA: Diagnosis not present

## 2020-08-16 DIAGNOSIS — I69354 Hemiplegia and hemiparesis following cerebral infarction affecting left non-dominant side: Secondary | ICD-10-CM | POA: Diagnosis not present

## 2020-08-17 DIAGNOSIS — I69054 Hemiplegia and hemiparesis following nontraumatic subarachnoid hemorrhage affecting left non-dominant side: Secondary | ICD-10-CM | POA: Diagnosis not present

## 2020-08-17 DIAGNOSIS — E118 Type 2 diabetes mellitus with unspecified complications: Secondary | ICD-10-CM | POA: Diagnosis not present

## 2020-08-17 DIAGNOSIS — I6330 Cerebral infarction due to thrombosis of unspecified cerebral artery: Secondary | ICD-10-CM | POA: Diagnosis not present

## 2020-08-17 DIAGNOSIS — I5032 Chronic diastolic (congestive) heart failure: Secondary | ICD-10-CM | POA: Diagnosis not present

## 2020-08-17 DIAGNOSIS — R293 Abnormal posture: Secondary | ICD-10-CM | POA: Diagnosis not present

## 2020-08-17 DIAGNOSIS — G894 Chronic pain syndrome: Secondary | ICD-10-CM | POA: Diagnosis not present

## 2020-08-17 DIAGNOSIS — I480 Paroxysmal atrial fibrillation: Secondary | ICD-10-CM | POA: Diagnosis not present

## 2020-08-17 DIAGNOSIS — Z20822 Contact with and (suspected) exposure to covid-19: Secondary | ICD-10-CM | POA: Diagnosis not present

## 2020-08-17 DIAGNOSIS — M6281 Muscle weakness (generalized): Secondary | ICD-10-CM | POA: Diagnosis not present

## 2020-08-17 DIAGNOSIS — D649 Anemia, unspecified: Secondary | ICD-10-CM | POA: Diagnosis not present

## 2020-08-17 DIAGNOSIS — I69354 Hemiplegia and hemiparesis following cerebral infarction affecting left non-dominant side: Secondary | ICD-10-CM | POA: Diagnosis not present

## 2020-08-18 DIAGNOSIS — G894 Chronic pain syndrome: Secondary | ICD-10-CM | POA: Diagnosis not present

## 2020-08-18 DIAGNOSIS — I69354 Hemiplegia and hemiparesis following cerebral infarction affecting left non-dominant side: Secondary | ICD-10-CM | POA: Diagnosis not present

## 2020-08-18 DIAGNOSIS — I6330 Cerebral infarction due to thrombosis of unspecified cerebral artery: Secondary | ICD-10-CM | POA: Diagnosis not present

## 2020-08-18 DIAGNOSIS — M6281 Muscle weakness (generalized): Secondary | ICD-10-CM | POA: Diagnosis not present

## 2020-08-18 DIAGNOSIS — I69054 Hemiplegia and hemiparesis following nontraumatic subarachnoid hemorrhage affecting left non-dominant side: Secondary | ICD-10-CM | POA: Diagnosis not present

## 2020-08-18 DIAGNOSIS — R293 Abnormal posture: Secondary | ICD-10-CM | POA: Diagnosis not present

## 2020-08-20 DIAGNOSIS — M6281 Muscle weakness (generalized): Secondary | ICD-10-CM | POA: Diagnosis not present

## 2020-08-20 DIAGNOSIS — G894 Chronic pain syndrome: Secondary | ICD-10-CM | POA: Diagnosis not present

## 2020-08-20 DIAGNOSIS — I69354 Hemiplegia and hemiparesis following cerebral infarction affecting left non-dominant side: Secondary | ICD-10-CM | POA: Diagnosis not present

## 2020-08-20 DIAGNOSIS — R293 Abnormal posture: Secondary | ICD-10-CM | POA: Diagnosis not present

## 2020-08-20 DIAGNOSIS — I69054 Hemiplegia and hemiparesis following nontraumatic subarachnoid hemorrhage affecting left non-dominant side: Secondary | ICD-10-CM | POA: Diagnosis not present

## 2020-08-20 DIAGNOSIS — I6330 Cerebral infarction due to thrombosis of unspecified cerebral artery: Secondary | ICD-10-CM | POA: Diagnosis not present

## 2020-08-21 DIAGNOSIS — I6330 Cerebral infarction due to thrombosis of unspecified cerebral artery: Secondary | ICD-10-CM | POA: Diagnosis not present

## 2020-08-21 DIAGNOSIS — M6281 Muscle weakness (generalized): Secondary | ICD-10-CM | POA: Diagnosis not present

## 2020-08-21 DIAGNOSIS — I69354 Hemiplegia and hemiparesis following cerebral infarction affecting left non-dominant side: Secondary | ICD-10-CM | POA: Diagnosis not present

## 2020-08-21 DIAGNOSIS — G894 Chronic pain syndrome: Secondary | ICD-10-CM | POA: Diagnosis not present

## 2020-08-21 DIAGNOSIS — R293 Abnormal posture: Secondary | ICD-10-CM | POA: Diagnosis not present

## 2020-08-21 DIAGNOSIS — I69054 Hemiplegia and hemiparesis following nontraumatic subarachnoid hemorrhage affecting left non-dominant side: Secondary | ICD-10-CM | POA: Diagnosis not present

## 2020-08-22 DIAGNOSIS — I6330 Cerebral infarction due to thrombosis of unspecified cerebral artery: Secondary | ICD-10-CM | POA: Diagnosis not present

## 2020-08-22 DIAGNOSIS — I69054 Hemiplegia and hemiparesis following nontraumatic subarachnoid hemorrhage affecting left non-dominant side: Secondary | ICD-10-CM | POA: Diagnosis not present

## 2020-08-22 DIAGNOSIS — G894 Chronic pain syndrome: Secondary | ICD-10-CM | POA: Diagnosis not present

## 2020-08-22 DIAGNOSIS — R293 Abnormal posture: Secondary | ICD-10-CM | POA: Diagnosis not present

## 2020-08-22 DIAGNOSIS — I69354 Hemiplegia and hemiparesis following cerebral infarction affecting left non-dominant side: Secondary | ICD-10-CM | POA: Diagnosis not present

## 2020-08-22 DIAGNOSIS — M6281 Muscle weakness (generalized): Secondary | ICD-10-CM | POA: Diagnosis not present

## 2020-08-23 DIAGNOSIS — I69354 Hemiplegia and hemiparesis following cerebral infarction affecting left non-dominant side: Secondary | ICD-10-CM | POA: Diagnosis not present

## 2020-08-23 DIAGNOSIS — I6330 Cerebral infarction due to thrombosis of unspecified cerebral artery: Secondary | ICD-10-CM | POA: Diagnosis not present

## 2020-08-23 DIAGNOSIS — M6281 Muscle weakness (generalized): Secondary | ICD-10-CM | POA: Diagnosis not present

## 2020-08-23 DIAGNOSIS — R293 Abnormal posture: Secondary | ICD-10-CM | POA: Diagnosis not present

## 2020-08-23 DIAGNOSIS — I69054 Hemiplegia and hemiparesis following nontraumatic subarachnoid hemorrhage affecting left non-dominant side: Secondary | ICD-10-CM | POA: Diagnosis not present

## 2020-08-23 DIAGNOSIS — G894 Chronic pain syndrome: Secondary | ICD-10-CM | POA: Diagnosis not present

## 2020-08-24 DIAGNOSIS — U071 COVID-19: Secondary | ICD-10-CM | POA: Diagnosis not present

## 2020-08-25 DIAGNOSIS — M6281 Muscle weakness (generalized): Secondary | ICD-10-CM | POA: Diagnosis not present

## 2020-08-25 DIAGNOSIS — R278 Other lack of coordination: Secondary | ICD-10-CM | POA: Diagnosis not present

## 2020-08-25 DIAGNOSIS — E440 Moderate protein-calorie malnutrition: Secondary | ICD-10-CM | POA: Diagnosis not present

## 2020-08-25 DIAGNOSIS — R293 Abnormal posture: Secondary | ICD-10-CM | POA: Diagnosis not present

## 2020-08-25 DIAGNOSIS — R41841 Cognitive communication deficit: Secondary | ICD-10-CM | POA: Diagnosis not present

## 2020-08-25 DIAGNOSIS — R1312 Dysphagia, oropharyngeal phase: Secondary | ICD-10-CM | POA: Diagnosis not present

## 2020-08-25 DIAGNOSIS — R4701 Aphasia: Secondary | ICD-10-CM | POA: Diagnosis not present

## 2020-08-25 DIAGNOSIS — I6330 Cerebral infarction due to thrombosis of unspecified cerebral artery: Secondary | ICD-10-CM | POA: Diagnosis not present

## 2020-08-25 DIAGNOSIS — I69354 Hemiplegia and hemiparesis following cerebral infarction affecting left non-dominant side: Secondary | ICD-10-CM | POA: Diagnosis not present

## 2020-08-25 DIAGNOSIS — G894 Chronic pain syndrome: Secondary | ICD-10-CM | POA: Diagnosis not present

## 2020-08-25 DIAGNOSIS — E1159 Type 2 diabetes mellitus with other circulatory complications: Secondary | ICD-10-CM | POA: Diagnosis not present

## 2020-08-25 DIAGNOSIS — R2689 Other abnormalities of gait and mobility: Secondary | ICD-10-CM | POA: Diagnosis not present

## 2020-08-25 DIAGNOSIS — I69054 Hemiplegia and hemiparesis following nontraumatic subarachnoid hemorrhage affecting left non-dominant side: Secondary | ICD-10-CM | POA: Diagnosis not present

## 2020-08-26 DIAGNOSIS — G894 Chronic pain syndrome: Secondary | ICD-10-CM | POA: Diagnosis not present

## 2020-08-26 DIAGNOSIS — I69354 Hemiplegia and hemiparesis following cerebral infarction affecting left non-dominant side: Secondary | ICD-10-CM | POA: Diagnosis not present

## 2020-08-26 DIAGNOSIS — M6281 Muscle weakness (generalized): Secondary | ICD-10-CM | POA: Diagnosis not present

## 2020-08-26 DIAGNOSIS — R293 Abnormal posture: Secondary | ICD-10-CM | POA: Diagnosis not present

## 2020-08-26 DIAGNOSIS — I69054 Hemiplegia and hemiparesis following nontraumatic subarachnoid hemorrhage affecting left non-dominant side: Secondary | ICD-10-CM | POA: Diagnosis not present

## 2020-08-26 DIAGNOSIS — I6330 Cerebral infarction due to thrombosis of unspecified cerebral artery: Secondary | ICD-10-CM | POA: Diagnosis not present

## 2020-08-27 DIAGNOSIS — R293 Abnormal posture: Secondary | ICD-10-CM | POA: Diagnosis not present

## 2020-08-27 DIAGNOSIS — I69054 Hemiplegia and hemiparesis following nontraumatic subarachnoid hemorrhage affecting left non-dominant side: Secondary | ICD-10-CM | POA: Diagnosis not present

## 2020-08-27 DIAGNOSIS — I6330 Cerebral infarction due to thrombosis of unspecified cerebral artery: Secondary | ICD-10-CM | POA: Diagnosis not present

## 2020-08-27 DIAGNOSIS — M6281 Muscle weakness (generalized): Secondary | ICD-10-CM | POA: Diagnosis not present

## 2020-08-27 DIAGNOSIS — G894 Chronic pain syndrome: Secondary | ICD-10-CM | POA: Diagnosis not present

## 2020-08-27 DIAGNOSIS — I69354 Hemiplegia and hemiparesis following cerebral infarction affecting left non-dominant side: Secondary | ICD-10-CM | POA: Diagnosis not present

## 2020-08-28 DIAGNOSIS — R293 Abnormal posture: Secondary | ICD-10-CM | POA: Diagnosis not present

## 2020-08-28 DIAGNOSIS — I69354 Hemiplegia and hemiparesis following cerebral infarction affecting left non-dominant side: Secondary | ICD-10-CM | POA: Diagnosis not present

## 2020-08-28 DIAGNOSIS — I6330 Cerebral infarction due to thrombosis of unspecified cerebral artery: Secondary | ICD-10-CM | POA: Diagnosis not present

## 2020-08-28 DIAGNOSIS — M6281 Muscle weakness (generalized): Secondary | ICD-10-CM | POA: Diagnosis not present

## 2020-08-28 DIAGNOSIS — G894 Chronic pain syndrome: Secondary | ICD-10-CM | POA: Diagnosis not present

## 2020-08-28 DIAGNOSIS — I69054 Hemiplegia and hemiparesis following nontraumatic subarachnoid hemorrhage affecting left non-dominant side: Secondary | ICD-10-CM | POA: Diagnosis not present

## 2020-08-29 DIAGNOSIS — I69354 Hemiplegia and hemiparesis following cerebral infarction affecting left non-dominant side: Secondary | ICD-10-CM | POA: Diagnosis not present

## 2020-08-29 DIAGNOSIS — I69054 Hemiplegia and hemiparesis following nontraumatic subarachnoid hemorrhage affecting left non-dominant side: Secondary | ICD-10-CM | POA: Diagnosis not present

## 2020-08-29 DIAGNOSIS — R293 Abnormal posture: Secondary | ICD-10-CM | POA: Diagnosis not present

## 2020-08-29 DIAGNOSIS — I6330 Cerebral infarction due to thrombosis of unspecified cerebral artery: Secondary | ICD-10-CM | POA: Diagnosis not present

## 2020-08-29 DIAGNOSIS — M6281 Muscle weakness (generalized): Secondary | ICD-10-CM | POA: Diagnosis not present

## 2020-08-29 DIAGNOSIS — U071 COVID-19: Secondary | ICD-10-CM | POA: Diagnosis not present

## 2020-08-29 DIAGNOSIS — G894 Chronic pain syndrome: Secondary | ICD-10-CM | POA: Diagnosis not present

## 2020-08-30 DIAGNOSIS — M6281 Muscle weakness (generalized): Secondary | ICD-10-CM | POA: Diagnosis not present

## 2020-08-30 DIAGNOSIS — I69354 Hemiplegia and hemiparesis following cerebral infarction affecting left non-dominant side: Secondary | ICD-10-CM | POA: Diagnosis not present

## 2020-08-30 DIAGNOSIS — I69054 Hemiplegia and hemiparesis following nontraumatic subarachnoid hemorrhage affecting left non-dominant side: Secondary | ICD-10-CM | POA: Diagnosis not present

## 2020-08-30 DIAGNOSIS — R293 Abnormal posture: Secondary | ICD-10-CM | POA: Diagnosis not present

## 2020-08-30 DIAGNOSIS — I6330 Cerebral infarction due to thrombosis of unspecified cerebral artery: Secondary | ICD-10-CM | POA: Diagnosis not present

## 2020-08-30 DIAGNOSIS — G894 Chronic pain syndrome: Secondary | ICD-10-CM | POA: Diagnosis not present

## 2020-08-31 DIAGNOSIS — M6281 Muscle weakness (generalized): Secondary | ICD-10-CM | POA: Diagnosis not present

## 2020-08-31 DIAGNOSIS — G894 Chronic pain syndrome: Secondary | ICD-10-CM | POA: Diagnosis not present

## 2020-08-31 DIAGNOSIS — I69054 Hemiplegia and hemiparesis following nontraumatic subarachnoid hemorrhage affecting left non-dominant side: Secondary | ICD-10-CM | POA: Diagnosis not present

## 2020-08-31 DIAGNOSIS — R293 Abnormal posture: Secondary | ICD-10-CM | POA: Diagnosis not present

## 2020-08-31 DIAGNOSIS — I69354 Hemiplegia and hemiparesis following cerebral infarction affecting left non-dominant side: Secondary | ICD-10-CM | POA: Diagnosis not present

## 2020-08-31 DIAGNOSIS — I6330 Cerebral infarction due to thrombosis of unspecified cerebral artery: Secondary | ICD-10-CM | POA: Diagnosis not present

## 2020-08-31 DIAGNOSIS — U071 COVID-19: Secondary | ICD-10-CM | POA: Diagnosis not present

## 2020-09-01 DIAGNOSIS — R293 Abnormal posture: Secondary | ICD-10-CM | POA: Diagnosis not present

## 2020-09-01 DIAGNOSIS — I69054 Hemiplegia and hemiparesis following nontraumatic subarachnoid hemorrhage affecting left non-dominant side: Secondary | ICD-10-CM | POA: Diagnosis not present

## 2020-09-01 DIAGNOSIS — G894 Chronic pain syndrome: Secondary | ICD-10-CM | POA: Diagnosis not present

## 2020-09-01 DIAGNOSIS — I6330 Cerebral infarction due to thrombosis of unspecified cerebral artery: Secondary | ICD-10-CM | POA: Diagnosis not present

## 2020-09-01 DIAGNOSIS — I69354 Hemiplegia and hemiparesis following cerebral infarction affecting left non-dominant side: Secondary | ICD-10-CM | POA: Diagnosis not present

## 2020-09-01 DIAGNOSIS — M6281 Muscle weakness (generalized): Secondary | ICD-10-CM | POA: Diagnosis not present

## 2020-09-02 DIAGNOSIS — R293 Abnormal posture: Secondary | ICD-10-CM | POA: Diagnosis not present

## 2020-09-02 DIAGNOSIS — I6330 Cerebral infarction due to thrombosis of unspecified cerebral artery: Secondary | ICD-10-CM | POA: Diagnosis not present

## 2020-09-02 DIAGNOSIS — G894 Chronic pain syndrome: Secondary | ICD-10-CM | POA: Diagnosis not present

## 2020-09-02 DIAGNOSIS — M6281 Muscle weakness (generalized): Secondary | ICD-10-CM | POA: Diagnosis not present

## 2020-09-02 DIAGNOSIS — I69354 Hemiplegia and hemiparesis following cerebral infarction affecting left non-dominant side: Secondary | ICD-10-CM | POA: Diagnosis not present

## 2020-09-02 DIAGNOSIS — I69054 Hemiplegia and hemiparesis following nontraumatic subarachnoid hemorrhage affecting left non-dominant side: Secondary | ICD-10-CM | POA: Diagnosis not present

## 2020-09-03 DIAGNOSIS — I69354 Hemiplegia and hemiparesis following cerebral infarction affecting left non-dominant side: Secondary | ICD-10-CM | POA: Diagnosis not present

## 2020-09-03 DIAGNOSIS — G894 Chronic pain syndrome: Secondary | ICD-10-CM | POA: Diagnosis not present

## 2020-09-03 DIAGNOSIS — R293 Abnormal posture: Secondary | ICD-10-CM | POA: Diagnosis not present

## 2020-09-03 DIAGNOSIS — M6281 Muscle weakness (generalized): Secondary | ICD-10-CM | POA: Diagnosis not present

## 2020-09-03 DIAGNOSIS — I6330 Cerebral infarction due to thrombosis of unspecified cerebral artery: Secondary | ICD-10-CM | POA: Diagnosis not present

## 2020-09-03 DIAGNOSIS — I69054 Hemiplegia and hemiparesis following nontraumatic subarachnoid hemorrhage affecting left non-dominant side: Secondary | ICD-10-CM | POA: Diagnosis not present

## 2020-09-04 DIAGNOSIS — M6281 Muscle weakness (generalized): Secondary | ICD-10-CM | POA: Diagnosis not present

## 2020-09-04 DIAGNOSIS — I69354 Hemiplegia and hemiparesis following cerebral infarction affecting left non-dominant side: Secondary | ICD-10-CM | POA: Diagnosis not present

## 2020-09-04 DIAGNOSIS — G894 Chronic pain syndrome: Secondary | ICD-10-CM | POA: Diagnosis not present

## 2020-09-04 DIAGNOSIS — R293 Abnormal posture: Secondary | ICD-10-CM | POA: Diagnosis not present

## 2020-09-04 DIAGNOSIS — I6330 Cerebral infarction due to thrombosis of unspecified cerebral artery: Secondary | ICD-10-CM | POA: Diagnosis not present

## 2020-09-04 DIAGNOSIS — I69054 Hemiplegia and hemiparesis following nontraumatic subarachnoid hemorrhage affecting left non-dominant side: Secondary | ICD-10-CM | POA: Diagnosis not present

## 2020-09-05 DIAGNOSIS — R293 Abnormal posture: Secondary | ICD-10-CM | POA: Diagnosis not present

## 2020-09-05 DIAGNOSIS — G894 Chronic pain syndrome: Secondary | ICD-10-CM | POA: Diagnosis not present

## 2020-09-05 DIAGNOSIS — I69354 Hemiplegia and hemiparesis following cerebral infarction affecting left non-dominant side: Secondary | ICD-10-CM | POA: Diagnosis not present

## 2020-09-05 DIAGNOSIS — M6281 Muscle weakness (generalized): Secondary | ICD-10-CM | POA: Diagnosis not present

## 2020-09-05 DIAGNOSIS — I69054 Hemiplegia and hemiparesis following nontraumatic subarachnoid hemorrhage affecting left non-dominant side: Secondary | ICD-10-CM | POA: Diagnosis not present

## 2020-09-05 DIAGNOSIS — I6330 Cerebral infarction due to thrombosis of unspecified cerebral artery: Secondary | ICD-10-CM | POA: Diagnosis not present

## 2020-09-06 DIAGNOSIS — I69054 Hemiplegia and hemiparesis following nontraumatic subarachnoid hemorrhage affecting left non-dominant side: Secondary | ICD-10-CM | POA: Diagnosis not present

## 2020-09-06 DIAGNOSIS — I69354 Hemiplegia and hemiparesis following cerebral infarction affecting left non-dominant side: Secondary | ICD-10-CM | POA: Diagnosis not present

## 2020-09-06 DIAGNOSIS — M6281 Muscle weakness (generalized): Secondary | ICD-10-CM | POA: Diagnosis not present

## 2020-09-06 DIAGNOSIS — G894 Chronic pain syndrome: Secondary | ICD-10-CM | POA: Diagnosis not present

## 2020-09-06 DIAGNOSIS — I6330 Cerebral infarction due to thrombosis of unspecified cerebral artery: Secondary | ICD-10-CM | POA: Diagnosis not present

## 2020-09-06 DIAGNOSIS — R293 Abnormal posture: Secondary | ICD-10-CM | POA: Diagnosis not present

## 2020-09-07 DIAGNOSIS — I6330 Cerebral infarction due to thrombosis of unspecified cerebral artery: Secondary | ICD-10-CM | POA: Diagnosis not present

## 2020-09-07 DIAGNOSIS — I69054 Hemiplegia and hemiparesis following nontraumatic subarachnoid hemorrhage affecting left non-dominant side: Secondary | ICD-10-CM | POA: Diagnosis not present

## 2020-09-07 DIAGNOSIS — R293 Abnormal posture: Secondary | ICD-10-CM | POA: Diagnosis not present

## 2020-09-07 DIAGNOSIS — G894 Chronic pain syndrome: Secondary | ICD-10-CM | POA: Diagnosis not present

## 2020-09-07 DIAGNOSIS — M6281 Muscle weakness (generalized): Secondary | ICD-10-CM | POA: Diagnosis not present

## 2020-09-07 DIAGNOSIS — U071 COVID-19: Secondary | ICD-10-CM | POA: Diagnosis not present

## 2020-09-07 DIAGNOSIS — I69354 Hemiplegia and hemiparesis following cerebral infarction affecting left non-dominant side: Secondary | ICD-10-CM | POA: Diagnosis not present

## 2020-09-08 DIAGNOSIS — M6281 Muscle weakness (generalized): Secondary | ICD-10-CM | POA: Diagnosis not present

## 2020-09-08 DIAGNOSIS — L538 Other specified erythematous conditions: Secondary | ICD-10-CM | POA: Diagnosis not present

## 2020-09-08 DIAGNOSIS — I6330 Cerebral infarction due to thrombosis of unspecified cerebral artery: Secondary | ICD-10-CM | POA: Diagnosis not present

## 2020-09-08 DIAGNOSIS — R0981 Nasal congestion: Secondary | ICD-10-CM | POA: Diagnosis not present

## 2020-09-08 DIAGNOSIS — G894 Chronic pain syndrome: Secondary | ICD-10-CM | POA: Diagnosis not present

## 2020-09-08 DIAGNOSIS — B351 Tinea unguium: Secondary | ICD-10-CM | POA: Diagnosis not present

## 2020-09-08 DIAGNOSIS — R293 Abnormal posture: Secondary | ICD-10-CM | POA: Diagnosis not present

## 2020-09-08 DIAGNOSIS — I69354 Hemiplegia and hemiparesis following cerebral infarction affecting left non-dominant side: Secondary | ICD-10-CM | POA: Diagnosis not present

## 2020-09-08 DIAGNOSIS — I69054 Hemiplegia and hemiparesis following nontraumatic subarachnoid hemorrhage affecting left non-dominant side: Secondary | ICD-10-CM | POA: Diagnosis not present

## 2020-09-09 DIAGNOSIS — R293 Abnormal posture: Secondary | ICD-10-CM | POA: Diagnosis not present

## 2020-09-09 DIAGNOSIS — M6281 Muscle weakness (generalized): Secondary | ICD-10-CM | POA: Diagnosis not present

## 2020-09-09 DIAGNOSIS — I69054 Hemiplegia and hemiparesis following nontraumatic subarachnoid hemorrhage affecting left non-dominant side: Secondary | ICD-10-CM | POA: Diagnosis not present

## 2020-09-09 DIAGNOSIS — I69354 Hemiplegia and hemiparesis following cerebral infarction affecting left non-dominant side: Secondary | ICD-10-CM | POA: Diagnosis not present

## 2020-09-09 DIAGNOSIS — G894 Chronic pain syndrome: Secondary | ICD-10-CM | POA: Diagnosis not present

## 2020-09-09 DIAGNOSIS — I6330 Cerebral infarction due to thrombosis of unspecified cerebral artery: Secondary | ICD-10-CM | POA: Diagnosis not present

## 2020-09-11 DIAGNOSIS — I69354 Hemiplegia and hemiparesis following cerebral infarction affecting left non-dominant side: Secondary | ICD-10-CM | POA: Diagnosis not present

## 2020-09-11 DIAGNOSIS — R293 Abnormal posture: Secondary | ICD-10-CM | POA: Diagnosis not present

## 2020-09-11 DIAGNOSIS — M6281 Muscle weakness (generalized): Secondary | ICD-10-CM | POA: Diagnosis not present

## 2020-09-11 DIAGNOSIS — I69054 Hemiplegia and hemiparesis following nontraumatic subarachnoid hemorrhage affecting left non-dominant side: Secondary | ICD-10-CM | POA: Diagnosis not present

## 2020-09-11 DIAGNOSIS — U071 COVID-19: Secondary | ICD-10-CM | POA: Diagnosis not present

## 2020-09-11 DIAGNOSIS — I6330 Cerebral infarction due to thrombosis of unspecified cerebral artery: Secondary | ICD-10-CM | POA: Diagnosis not present

## 2020-09-11 DIAGNOSIS — G894 Chronic pain syndrome: Secondary | ICD-10-CM | POA: Diagnosis not present

## 2020-09-12 DIAGNOSIS — G894 Chronic pain syndrome: Secondary | ICD-10-CM | POA: Diagnosis not present

## 2020-09-12 DIAGNOSIS — I69354 Hemiplegia and hemiparesis following cerebral infarction affecting left non-dominant side: Secondary | ICD-10-CM | POA: Diagnosis not present

## 2020-09-12 DIAGNOSIS — M6281 Muscle weakness (generalized): Secondary | ICD-10-CM | POA: Diagnosis not present

## 2020-09-12 DIAGNOSIS — I6330 Cerebral infarction due to thrombosis of unspecified cerebral artery: Secondary | ICD-10-CM | POA: Diagnosis not present

## 2020-09-12 DIAGNOSIS — I69054 Hemiplegia and hemiparesis following nontraumatic subarachnoid hemorrhage affecting left non-dominant side: Secondary | ICD-10-CM | POA: Diagnosis not present

## 2020-09-12 DIAGNOSIS — R293 Abnormal posture: Secondary | ICD-10-CM | POA: Diagnosis not present

## 2020-09-13 DIAGNOSIS — E1151 Type 2 diabetes mellitus with diabetic peripheral angiopathy without gangrene: Secondary | ICD-10-CM | POA: Diagnosis not present

## 2020-09-13 DIAGNOSIS — E039 Hypothyroidism, unspecified: Secondary | ICD-10-CM | POA: Diagnosis not present

## 2020-09-13 DIAGNOSIS — E782 Mixed hyperlipidemia: Secondary | ICD-10-CM | POA: Diagnosis not present

## 2020-09-13 DIAGNOSIS — M6281 Muscle weakness (generalized): Secondary | ICD-10-CM | POA: Diagnosis not present

## 2020-09-13 DIAGNOSIS — I69354 Hemiplegia and hemiparesis following cerebral infarction affecting left non-dominant side: Secondary | ICD-10-CM | POA: Diagnosis not present

## 2020-09-13 DIAGNOSIS — F329 Major depressive disorder, single episode, unspecified: Secondary | ICD-10-CM | POA: Diagnosis not present

## 2020-09-13 DIAGNOSIS — I4891 Unspecified atrial fibrillation: Secondary | ICD-10-CM | POA: Diagnosis not present

## 2020-09-13 DIAGNOSIS — I6330 Cerebral infarction due to thrombosis of unspecified cerebral artery: Secondary | ICD-10-CM | POA: Diagnosis not present

## 2020-09-13 DIAGNOSIS — I5032 Chronic diastolic (congestive) heart failure: Secondary | ICD-10-CM | POA: Diagnosis not present

## 2020-09-13 DIAGNOSIS — S61309A Unspecified open wound of unspecified finger with damage to nail, initial encounter: Secondary | ICD-10-CM | POA: Diagnosis not present

## 2020-09-13 DIAGNOSIS — G8929 Other chronic pain: Secondary | ICD-10-CM | POA: Diagnosis not present

## 2020-09-13 DIAGNOSIS — I69054 Hemiplegia and hemiparesis following nontraumatic subarachnoid hemorrhage affecting left non-dominant side: Secondary | ICD-10-CM | POA: Diagnosis not present

## 2020-09-13 DIAGNOSIS — R293 Abnormal posture: Secondary | ICD-10-CM | POA: Diagnosis not present

## 2020-09-13 DIAGNOSIS — I10 Essential (primary) hypertension: Secondary | ICD-10-CM | POA: Diagnosis not present

## 2020-09-13 DIAGNOSIS — G894 Chronic pain syndrome: Secondary | ICD-10-CM | POA: Diagnosis not present

## 2020-09-14 DIAGNOSIS — R293 Abnormal posture: Secondary | ICD-10-CM | POA: Diagnosis not present

## 2020-09-14 DIAGNOSIS — Z20822 Contact with and (suspected) exposure to covid-19: Secondary | ICD-10-CM | POA: Diagnosis not present

## 2020-09-14 DIAGNOSIS — I69354 Hemiplegia and hemiparesis following cerebral infarction affecting left non-dominant side: Secondary | ICD-10-CM | POA: Diagnosis not present

## 2020-09-14 DIAGNOSIS — G894 Chronic pain syndrome: Secondary | ICD-10-CM | POA: Diagnosis not present

## 2020-09-14 DIAGNOSIS — M6281 Muscle weakness (generalized): Secondary | ICD-10-CM | POA: Diagnosis not present

## 2020-09-14 DIAGNOSIS — I6330 Cerebral infarction due to thrombosis of unspecified cerebral artery: Secondary | ICD-10-CM | POA: Diagnosis not present

## 2020-09-14 DIAGNOSIS — I69054 Hemiplegia and hemiparesis following nontraumatic subarachnoid hemorrhage affecting left non-dominant side: Secondary | ICD-10-CM | POA: Diagnosis not present

## 2020-09-15 DIAGNOSIS — I6330 Cerebral infarction due to thrombosis of unspecified cerebral artery: Secondary | ICD-10-CM | POA: Diagnosis not present

## 2020-09-15 DIAGNOSIS — R293 Abnormal posture: Secondary | ICD-10-CM | POA: Diagnosis not present

## 2020-09-15 DIAGNOSIS — I69054 Hemiplegia and hemiparesis following nontraumatic subarachnoid hemorrhage affecting left non-dominant side: Secondary | ICD-10-CM | POA: Diagnosis not present

## 2020-09-15 DIAGNOSIS — M6281 Muscle weakness (generalized): Secondary | ICD-10-CM | POA: Diagnosis not present

## 2020-09-15 DIAGNOSIS — I69354 Hemiplegia and hemiparesis following cerebral infarction affecting left non-dominant side: Secondary | ICD-10-CM | POA: Diagnosis not present

## 2020-09-15 DIAGNOSIS — G894 Chronic pain syndrome: Secondary | ICD-10-CM | POA: Diagnosis not present

## 2020-09-16 DIAGNOSIS — I6330 Cerebral infarction due to thrombosis of unspecified cerebral artery: Secondary | ICD-10-CM | POA: Diagnosis not present

## 2020-09-16 DIAGNOSIS — G894 Chronic pain syndrome: Secondary | ICD-10-CM | POA: Diagnosis not present

## 2020-09-16 DIAGNOSIS — R293 Abnormal posture: Secondary | ICD-10-CM | POA: Diagnosis not present

## 2020-09-16 DIAGNOSIS — I69054 Hemiplegia and hemiparesis following nontraumatic subarachnoid hemorrhage affecting left non-dominant side: Secondary | ICD-10-CM | POA: Diagnosis not present

## 2020-09-16 DIAGNOSIS — M6281 Muscle weakness (generalized): Secondary | ICD-10-CM | POA: Diagnosis not present

## 2020-09-16 DIAGNOSIS — I69354 Hemiplegia and hemiparesis following cerebral infarction affecting left non-dominant side: Secondary | ICD-10-CM | POA: Diagnosis not present

## 2020-09-17 DIAGNOSIS — M6281 Muscle weakness (generalized): Secondary | ICD-10-CM | POA: Diagnosis not present

## 2020-09-17 DIAGNOSIS — I6330 Cerebral infarction due to thrombosis of unspecified cerebral artery: Secondary | ICD-10-CM | POA: Diagnosis not present

## 2020-09-17 DIAGNOSIS — I69354 Hemiplegia and hemiparesis following cerebral infarction affecting left non-dominant side: Secondary | ICD-10-CM | POA: Diagnosis not present

## 2020-09-17 DIAGNOSIS — I69054 Hemiplegia and hemiparesis following nontraumatic subarachnoid hemorrhage affecting left non-dominant side: Secondary | ICD-10-CM | POA: Diagnosis not present

## 2020-09-17 DIAGNOSIS — G894 Chronic pain syndrome: Secondary | ICD-10-CM | POA: Diagnosis not present

## 2020-09-17 DIAGNOSIS — R293 Abnormal posture: Secondary | ICD-10-CM | POA: Diagnosis not present

## 2020-09-18 DIAGNOSIS — I69354 Hemiplegia and hemiparesis following cerebral infarction affecting left non-dominant side: Secondary | ICD-10-CM | POA: Diagnosis not present

## 2020-09-18 DIAGNOSIS — R293 Abnormal posture: Secondary | ICD-10-CM | POA: Diagnosis not present

## 2020-09-18 DIAGNOSIS — F411 Generalized anxiety disorder: Secondary | ICD-10-CM | POA: Diagnosis not present

## 2020-09-18 DIAGNOSIS — G894 Chronic pain syndrome: Secondary | ICD-10-CM | POA: Diagnosis not present

## 2020-09-18 DIAGNOSIS — F331 Major depressive disorder, recurrent, moderate: Secondary | ICD-10-CM | POA: Diagnosis not present

## 2020-09-18 DIAGNOSIS — M6281 Muscle weakness (generalized): Secondary | ICD-10-CM | POA: Diagnosis not present

## 2020-09-18 DIAGNOSIS — I69054 Hemiplegia and hemiparesis following nontraumatic subarachnoid hemorrhage affecting left non-dominant side: Secondary | ICD-10-CM | POA: Diagnosis not present

## 2020-09-18 DIAGNOSIS — I6330 Cerebral infarction due to thrombosis of unspecified cerebral artery: Secondary | ICD-10-CM | POA: Diagnosis not present

## 2020-09-19 DIAGNOSIS — G894 Chronic pain syndrome: Secondary | ICD-10-CM | POA: Diagnosis not present

## 2020-09-19 DIAGNOSIS — I6330 Cerebral infarction due to thrombosis of unspecified cerebral artery: Secondary | ICD-10-CM | POA: Diagnosis not present

## 2020-09-19 DIAGNOSIS — M6281 Muscle weakness (generalized): Secondary | ICD-10-CM | POA: Diagnosis not present

## 2020-09-19 DIAGNOSIS — R293 Abnormal posture: Secondary | ICD-10-CM | POA: Diagnosis not present

## 2020-09-19 DIAGNOSIS — I69354 Hemiplegia and hemiparesis following cerebral infarction affecting left non-dominant side: Secondary | ICD-10-CM | POA: Diagnosis not present

## 2020-09-19 DIAGNOSIS — I69054 Hemiplegia and hemiparesis following nontraumatic subarachnoid hemorrhage affecting left non-dominant side: Secondary | ICD-10-CM | POA: Diagnosis not present

## 2020-09-20 DIAGNOSIS — I69354 Hemiplegia and hemiparesis following cerebral infarction affecting left non-dominant side: Secondary | ICD-10-CM | POA: Diagnosis not present

## 2020-09-20 DIAGNOSIS — R293 Abnormal posture: Secondary | ICD-10-CM | POA: Diagnosis not present

## 2020-09-20 DIAGNOSIS — I69054 Hemiplegia and hemiparesis following nontraumatic subarachnoid hemorrhage affecting left non-dominant side: Secondary | ICD-10-CM | POA: Diagnosis not present

## 2020-09-20 DIAGNOSIS — I6330 Cerebral infarction due to thrombosis of unspecified cerebral artery: Secondary | ICD-10-CM | POA: Diagnosis not present

## 2020-09-20 DIAGNOSIS — M6281 Muscle weakness (generalized): Secondary | ICD-10-CM | POA: Diagnosis not present

## 2020-09-20 DIAGNOSIS — G894 Chronic pain syndrome: Secondary | ICD-10-CM | POA: Diagnosis not present

## 2020-09-21 DIAGNOSIS — R293 Abnormal posture: Secondary | ICD-10-CM | POA: Diagnosis not present

## 2020-09-21 DIAGNOSIS — M6281 Muscle weakness (generalized): Secondary | ICD-10-CM | POA: Diagnosis not present

## 2020-09-21 DIAGNOSIS — I69354 Hemiplegia and hemiparesis following cerebral infarction affecting left non-dominant side: Secondary | ICD-10-CM | POA: Diagnosis not present

## 2020-09-21 DIAGNOSIS — I6330 Cerebral infarction due to thrombosis of unspecified cerebral artery: Secondary | ICD-10-CM | POA: Diagnosis not present

## 2020-09-21 DIAGNOSIS — I69054 Hemiplegia and hemiparesis following nontraumatic subarachnoid hemorrhage affecting left non-dominant side: Secondary | ICD-10-CM | POA: Diagnosis not present

## 2020-09-21 DIAGNOSIS — G894 Chronic pain syndrome: Secondary | ICD-10-CM | POA: Diagnosis not present

## 2020-09-22 DIAGNOSIS — E782 Mixed hyperlipidemia: Secondary | ICD-10-CM | POA: Diagnosis not present

## 2020-09-22 DIAGNOSIS — F329 Major depressive disorder, single episode, unspecified: Secondary | ICD-10-CM | POA: Diagnosis not present

## 2020-09-22 DIAGNOSIS — I6330 Cerebral infarction due to thrombosis of unspecified cerebral artery: Secondary | ICD-10-CM | POA: Diagnosis not present

## 2020-09-22 DIAGNOSIS — I10 Essential (primary) hypertension: Secondary | ICD-10-CM | POA: Diagnosis not present

## 2020-09-22 DIAGNOSIS — R293 Abnormal posture: Secondary | ICD-10-CM | POA: Diagnosis not present

## 2020-09-22 DIAGNOSIS — I5032 Chronic diastolic (congestive) heart failure: Secondary | ICD-10-CM | POA: Diagnosis not present

## 2020-09-22 DIAGNOSIS — E1151 Type 2 diabetes mellitus with diabetic peripheral angiopathy without gangrene: Secondary | ICD-10-CM | POA: Diagnosis not present

## 2020-09-22 DIAGNOSIS — I4891 Unspecified atrial fibrillation: Secondary | ICD-10-CM | POA: Diagnosis not present

## 2020-09-22 DIAGNOSIS — E039 Hypothyroidism, unspecified: Secondary | ICD-10-CM | POA: Diagnosis not present

## 2020-09-22 DIAGNOSIS — G8929 Other chronic pain: Secondary | ICD-10-CM | POA: Diagnosis not present

## 2020-09-22 DIAGNOSIS — G894 Chronic pain syndrome: Secondary | ICD-10-CM | POA: Diagnosis not present

## 2020-09-22 DIAGNOSIS — M6281 Muscle weakness (generalized): Secondary | ICD-10-CM | POA: Diagnosis not present

## 2020-09-22 DIAGNOSIS — I69354 Hemiplegia and hemiparesis following cerebral infarction affecting left non-dominant side: Secondary | ICD-10-CM | POA: Diagnosis not present

## 2020-09-22 DIAGNOSIS — I69054 Hemiplegia and hemiparesis following nontraumatic subarachnoid hemorrhage affecting left non-dominant side: Secondary | ICD-10-CM | POA: Diagnosis not present

## 2020-09-23 DIAGNOSIS — I69354 Hemiplegia and hemiparesis following cerebral infarction affecting left non-dominant side: Secondary | ICD-10-CM | POA: Diagnosis not present

## 2020-09-23 DIAGNOSIS — I6330 Cerebral infarction due to thrombosis of unspecified cerebral artery: Secondary | ICD-10-CM | POA: Diagnosis not present

## 2020-09-23 DIAGNOSIS — I69054 Hemiplegia and hemiparesis following nontraumatic subarachnoid hemorrhage affecting left non-dominant side: Secondary | ICD-10-CM | POA: Diagnosis not present

## 2020-09-23 DIAGNOSIS — M6281 Muscle weakness (generalized): Secondary | ICD-10-CM | POA: Diagnosis not present

## 2020-09-23 DIAGNOSIS — R293 Abnormal posture: Secondary | ICD-10-CM | POA: Diagnosis not present

## 2020-09-23 DIAGNOSIS — G894 Chronic pain syndrome: Secondary | ICD-10-CM | POA: Diagnosis not present

## 2020-09-24 DIAGNOSIS — M6281 Muscle weakness (generalized): Secondary | ICD-10-CM | POA: Diagnosis not present

## 2020-09-24 DIAGNOSIS — I6330 Cerebral infarction due to thrombosis of unspecified cerebral artery: Secondary | ICD-10-CM | POA: Diagnosis not present

## 2020-09-24 DIAGNOSIS — R293 Abnormal posture: Secondary | ICD-10-CM | POA: Diagnosis not present

## 2020-09-24 DIAGNOSIS — I69354 Hemiplegia and hemiparesis following cerebral infarction affecting left non-dominant side: Secondary | ICD-10-CM | POA: Diagnosis not present

## 2020-09-24 DIAGNOSIS — G894 Chronic pain syndrome: Secondary | ICD-10-CM | POA: Diagnosis not present

## 2020-09-24 DIAGNOSIS — I69054 Hemiplegia and hemiparesis following nontraumatic subarachnoid hemorrhage affecting left non-dominant side: Secondary | ICD-10-CM | POA: Diagnosis not present

## 2020-09-25 DIAGNOSIS — I69054 Hemiplegia and hemiparesis following nontraumatic subarachnoid hemorrhage affecting left non-dominant side: Secondary | ICD-10-CM | POA: Diagnosis not present

## 2020-09-25 DIAGNOSIS — I69354 Hemiplegia and hemiparesis following cerebral infarction affecting left non-dominant side: Secondary | ICD-10-CM | POA: Diagnosis not present

## 2020-09-25 DIAGNOSIS — M79675 Pain in left toe(s): Secondary | ICD-10-CM | POA: Diagnosis not present

## 2020-09-25 DIAGNOSIS — I6330 Cerebral infarction due to thrombosis of unspecified cerebral artery: Secondary | ICD-10-CM | POA: Diagnosis not present

## 2020-09-25 DIAGNOSIS — R262 Difficulty in walking, not elsewhere classified: Secondary | ICD-10-CM | POA: Diagnosis not present

## 2020-09-25 DIAGNOSIS — L603 Nail dystrophy: Secondary | ICD-10-CM | POA: Diagnosis not present

## 2020-09-25 DIAGNOSIS — R278 Other lack of coordination: Secondary | ICD-10-CM | POA: Diagnosis not present

## 2020-09-25 DIAGNOSIS — B351 Tinea unguium: Secondary | ICD-10-CM | POA: Diagnosis not present

## 2020-09-25 DIAGNOSIS — R41841 Cognitive communication deficit: Secondary | ICD-10-CM | POA: Diagnosis not present

## 2020-09-25 DIAGNOSIS — R4701 Aphasia: Secondary | ICD-10-CM | POA: Diagnosis not present

## 2020-09-25 DIAGNOSIS — M2041 Other hammer toe(s) (acquired), right foot: Secondary | ICD-10-CM | POA: Diagnosis not present

## 2020-09-25 DIAGNOSIS — E440 Moderate protein-calorie malnutrition: Secondary | ICD-10-CM | POA: Diagnosis not present

## 2020-09-25 DIAGNOSIS — E1159 Type 2 diabetes mellitus with other circulatory complications: Secondary | ICD-10-CM | POA: Diagnosis not present

## 2020-09-25 DIAGNOSIS — R1312 Dysphagia, oropharyngeal phase: Secondary | ICD-10-CM | POA: Diagnosis not present

## 2020-09-25 DIAGNOSIS — E1140 Type 2 diabetes mellitus with diabetic neuropathy, unspecified: Secondary | ICD-10-CM | POA: Diagnosis not present

## 2020-09-25 DIAGNOSIS — M6281 Muscle weakness (generalized): Secondary | ICD-10-CM | POA: Diagnosis not present

## 2020-09-25 DIAGNOSIS — R2689 Other abnormalities of gait and mobility: Secondary | ICD-10-CM | POA: Diagnosis not present

## 2020-09-25 DIAGNOSIS — M79674 Pain in right toe(s): Secondary | ICD-10-CM | POA: Diagnosis not present

## 2020-09-25 DIAGNOSIS — M2042 Other hammer toe(s) (acquired), left foot: Secondary | ICD-10-CM | POA: Diagnosis not present

## 2020-09-25 DIAGNOSIS — G894 Chronic pain syndrome: Secondary | ICD-10-CM | POA: Diagnosis not present

## 2020-09-25 DIAGNOSIS — R293 Abnormal posture: Secondary | ICD-10-CM | POA: Diagnosis not present

## 2020-09-26 DIAGNOSIS — M6281 Muscle weakness (generalized): Secondary | ICD-10-CM | POA: Diagnosis not present

## 2020-09-26 DIAGNOSIS — I69354 Hemiplegia and hemiparesis following cerebral infarction affecting left non-dominant side: Secondary | ICD-10-CM | POA: Diagnosis not present

## 2020-09-26 DIAGNOSIS — R293 Abnormal posture: Secondary | ICD-10-CM | POA: Diagnosis not present

## 2020-09-26 DIAGNOSIS — I6330 Cerebral infarction due to thrombosis of unspecified cerebral artery: Secondary | ICD-10-CM | POA: Diagnosis not present

## 2020-09-26 DIAGNOSIS — G894 Chronic pain syndrome: Secondary | ICD-10-CM | POA: Diagnosis not present

## 2020-09-26 DIAGNOSIS — I69054 Hemiplegia and hemiparesis following nontraumatic subarachnoid hemorrhage affecting left non-dominant side: Secondary | ICD-10-CM | POA: Diagnosis not present

## 2020-09-27 DIAGNOSIS — I10 Essential (primary) hypertension: Secondary | ICD-10-CM | POA: Diagnosis not present

## 2020-09-27 DIAGNOSIS — I69054 Hemiplegia and hemiparesis following nontraumatic subarachnoid hemorrhage affecting left non-dominant side: Secondary | ICD-10-CM | POA: Diagnosis not present

## 2020-09-27 DIAGNOSIS — I4891 Unspecified atrial fibrillation: Secondary | ICD-10-CM | POA: Diagnosis not present

## 2020-09-27 DIAGNOSIS — G8929 Other chronic pain: Secondary | ICD-10-CM | POA: Diagnosis not present

## 2020-09-27 DIAGNOSIS — M6281 Muscle weakness (generalized): Secondary | ICD-10-CM | POA: Diagnosis not present

## 2020-09-27 DIAGNOSIS — I5032 Chronic diastolic (congestive) heart failure: Secondary | ICD-10-CM | POA: Diagnosis not present

## 2020-09-27 DIAGNOSIS — E039 Hypothyroidism, unspecified: Secondary | ICD-10-CM | POA: Diagnosis not present

## 2020-09-27 DIAGNOSIS — E782 Mixed hyperlipidemia: Secondary | ICD-10-CM | POA: Diagnosis not present

## 2020-09-27 DIAGNOSIS — I6330 Cerebral infarction due to thrombosis of unspecified cerebral artery: Secondary | ICD-10-CM | POA: Diagnosis not present

## 2020-09-27 DIAGNOSIS — F329 Major depressive disorder, single episode, unspecified: Secondary | ICD-10-CM | POA: Diagnosis not present

## 2020-09-27 DIAGNOSIS — E1151 Type 2 diabetes mellitus with diabetic peripheral angiopathy without gangrene: Secondary | ICD-10-CM | POA: Diagnosis not present

## 2020-09-27 DIAGNOSIS — I69354 Hemiplegia and hemiparesis following cerebral infarction affecting left non-dominant side: Secondary | ICD-10-CM | POA: Diagnosis not present

## 2020-09-27 DIAGNOSIS — G894 Chronic pain syndrome: Secondary | ICD-10-CM | POA: Diagnosis not present

## 2020-09-27 DIAGNOSIS — R293 Abnormal posture: Secondary | ICD-10-CM | POA: Diagnosis not present

## 2020-09-28 DIAGNOSIS — I69054 Hemiplegia and hemiparesis following nontraumatic subarachnoid hemorrhage affecting left non-dominant side: Secondary | ICD-10-CM | POA: Diagnosis not present

## 2020-09-28 DIAGNOSIS — M6281 Muscle weakness (generalized): Secondary | ICD-10-CM | POA: Diagnosis not present

## 2020-09-28 DIAGNOSIS — I6330 Cerebral infarction due to thrombosis of unspecified cerebral artery: Secondary | ICD-10-CM | POA: Diagnosis not present

## 2020-09-28 DIAGNOSIS — I69354 Hemiplegia and hemiparesis following cerebral infarction affecting left non-dominant side: Secondary | ICD-10-CM | POA: Diagnosis not present

## 2020-09-28 DIAGNOSIS — G894 Chronic pain syndrome: Secondary | ICD-10-CM | POA: Diagnosis not present

## 2020-09-28 DIAGNOSIS — R293 Abnormal posture: Secondary | ICD-10-CM | POA: Diagnosis not present

## 2020-09-29 DIAGNOSIS — I69054 Hemiplegia and hemiparesis following nontraumatic subarachnoid hemorrhage affecting left non-dominant side: Secondary | ICD-10-CM | POA: Diagnosis not present

## 2020-09-29 DIAGNOSIS — I69354 Hemiplegia and hemiparesis following cerebral infarction affecting left non-dominant side: Secondary | ICD-10-CM | POA: Diagnosis not present

## 2020-09-29 DIAGNOSIS — G894 Chronic pain syndrome: Secondary | ICD-10-CM | POA: Diagnosis not present

## 2020-09-29 DIAGNOSIS — R293 Abnormal posture: Secondary | ICD-10-CM | POA: Diagnosis not present

## 2020-09-29 DIAGNOSIS — M6281 Muscle weakness (generalized): Secondary | ICD-10-CM | POA: Diagnosis not present

## 2020-09-29 DIAGNOSIS — I6330 Cerebral infarction due to thrombosis of unspecified cerebral artery: Secondary | ICD-10-CM | POA: Diagnosis not present

## 2020-09-30 DIAGNOSIS — I69054 Hemiplegia and hemiparesis following nontraumatic subarachnoid hemorrhage affecting left non-dominant side: Secondary | ICD-10-CM | POA: Diagnosis not present

## 2020-09-30 DIAGNOSIS — R293 Abnormal posture: Secondary | ICD-10-CM | POA: Diagnosis not present

## 2020-09-30 DIAGNOSIS — I69354 Hemiplegia and hemiparesis following cerebral infarction affecting left non-dominant side: Secondary | ICD-10-CM | POA: Diagnosis not present

## 2020-09-30 DIAGNOSIS — G894 Chronic pain syndrome: Secondary | ICD-10-CM | POA: Diagnosis not present

## 2020-09-30 DIAGNOSIS — M6281 Muscle weakness (generalized): Secondary | ICD-10-CM | POA: Diagnosis not present

## 2020-09-30 DIAGNOSIS — I6330 Cerebral infarction due to thrombosis of unspecified cerebral artery: Secondary | ICD-10-CM | POA: Diagnosis not present

## 2020-10-01 DIAGNOSIS — I6330 Cerebral infarction due to thrombosis of unspecified cerebral artery: Secondary | ICD-10-CM | POA: Diagnosis not present

## 2020-10-01 DIAGNOSIS — I69354 Hemiplegia and hemiparesis following cerebral infarction affecting left non-dominant side: Secondary | ICD-10-CM | POA: Diagnosis not present

## 2020-10-01 DIAGNOSIS — R293 Abnormal posture: Secondary | ICD-10-CM | POA: Diagnosis not present

## 2020-10-01 DIAGNOSIS — G894 Chronic pain syndrome: Secondary | ICD-10-CM | POA: Diagnosis not present

## 2020-10-01 DIAGNOSIS — I69054 Hemiplegia and hemiparesis following nontraumatic subarachnoid hemorrhage affecting left non-dominant side: Secondary | ICD-10-CM | POA: Diagnosis not present

## 2020-10-01 DIAGNOSIS — M6281 Muscle weakness (generalized): Secondary | ICD-10-CM | POA: Diagnosis not present

## 2020-10-02 DIAGNOSIS — I6330 Cerebral infarction due to thrombosis of unspecified cerebral artery: Secondary | ICD-10-CM | POA: Diagnosis not present

## 2020-10-02 DIAGNOSIS — G894 Chronic pain syndrome: Secondary | ICD-10-CM | POA: Diagnosis not present

## 2020-10-02 DIAGNOSIS — I69354 Hemiplegia and hemiparesis following cerebral infarction affecting left non-dominant side: Secondary | ICD-10-CM | POA: Diagnosis not present

## 2020-10-02 DIAGNOSIS — M6281 Muscle weakness (generalized): Secondary | ICD-10-CM | POA: Diagnosis not present

## 2020-10-02 DIAGNOSIS — R293 Abnormal posture: Secondary | ICD-10-CM | POA: Diagnosis not present

## 2020-10-02 DIAGNOSIS — I69054 Hemiplegia and hemiparesis following nontraumatic subarachnoid hemorrhage affecting left non-dominant side: Secondary | ICD-10-CM | POA: Diagnosis not present

## 2020-10-03 DIAGNOSIS — I69054 Hemiplegia and hemiparesis following nontraumatic subarachnoid hemorrhage affecting left non-dominant side: Secondary | ICD-10-CM | POA: Diagnosis not present

## 2020-10-03 DIAGNOSIS — G894 Chronic pain syndrome: Secondary | ICD-10-CM | POA: Diagnosis not present

## 2020-10-03 DIAGNOSIS — I6330 Cerebral infarction due to thrombosis of unspecified cerebral artery: Secondary | ICD-10-CM | POA: Diagnosis not present

## 2020-10-03 DIAGNOSIS — R293 Abnormal posture: Secondary | ICD-10-CM | POA: Diagnosis not present

## 2020-10-03 DIAGNOSIS — I5032 Chronic diastolic (congestive) heart failure: Secondary | ICD-10-CM | POA: Diagnosis not present

## 2020-10-03 DIAGNOSIS — I69354 Hemiplegia and hemiparesis following cerebral infarction affecting left non-dominant side: Secondary | ICD-10-CM | POA: Diagnosis not present

## 2020-10-03 DIAGNOSIS — I10 Essential (primary) hypertension: Secondary | ICD-10-CM | POA: Diagnosis not present

## 2020-10-03 DIAGNOSIS — S61309A Unspecified open wound of unspecified finger with damage to nail, initial encounter: Secondary | ICD-10-CM | POA: Diagnosis not present

## 2020-10-03 DIAGNOSIS — Z23 Encounter for immunization: Secondary | ICD-10-CM | POA: Diagnosis not present

## 2020-10-03 DIAGNOSIS — E118 Type 2 diabetes mellitus with unspecified complications: Secondary | ICD-10-CM | POA: Diagnosis not present

## 2020-10-03 DIAGNOSIS — M6281 Muscle weakness (generalized): Secondary | ICD-10-CM | POA: Diagnosis not present

## 2020-10-03 DIAGNOSIS — I480 Paroxysmal atrial fibrillation: Secondary | ICD-10-CM | POA: Diagnosis not present

## 2020-10-04 DIAGNOSIS — E782 Mixed hyperlipidemia: Secondary | ICD-10-CM | POA: Diagnosis not present

## 2020-10-04 DIAGNOSIS — I5032 Chronic diastolic (congestive) heart failure: Secondary | ICD-10-CM | POA: Diagnosis not present

## 2020-10-04 DIAGNOSIS — G8929 Other chronic pain: Secondary | ICD-10-CM | POA: Diagnosis not present

## 2020-10-04 DIAGNOSIS — I4891 Unspecified atrial fibrillation: Secondary | ICD-10-CM | POA: Diagnosis not present

## 2020-10-04 DIAGNOSIS — I69354 Hemiplegia and hemiparesis following cerebral infarction affecting left non-dominant side: Secondary | ICD-10-CM | POA: Diagnosis not present

## 2020-10-04 DIAGNOSIS — R293 Abnormal posture: Secondary | ICD-10-CM | POA: Diagnosis not present

## 2020-10-04 DIAGNOSIS — M6281 Muscle weakness (generalized): Secondary | ICD-10-CM | POA: Diagnosis not present

## 2020-10-04 DIAGNOSIS — G894 Chronic pain syndrome: Secondary | ICD-10-CM | POA: Diagnosis not present

## 2020-10-04 DIAGNOSIS — E039 Hypothyroidism, unspecified: Secondary | ICD-10-CM | POA: Diagnosis not present

## 2020-10-04 DIAGNOSIS — I69054 Hemiplegia and hemiparesis following nontraumatic subarachnoid hemorrhage affecting left non-dominant side: Secondary | ICD-10-CM | POA: Diagnosis not present

## 2020-10-04 DIAGNOSIS — E1151 Type 2 diabetes mellitus with diabetic peripheral angiopathy without gangrene: Secondary | ICD-10-CM | POA: Diagnosis not present

## 2020-10-04 DIAGNOSIS — I6330 Cerebral infarction due to thrombosis of unspecified cerebral artery: Secondary | ICD-10-CM | POA: Diagnosis not present

## 2020-10-04 DIAGNOSIS — F329 Major depressive disorder, single episode, unspecified: Secondary | ICD-10-CM | POA: Diagnosis not present

## 2020-10-04 DIAGNOSIS — I10 Essential (primary) hypertension: Secondary | ICD-10-CM | POA: Diagnosis not present

## 2020-10-05 DIAGNOSIS — I69054 Hemiplegia and hemiparesis following nontraumatic subarachnoid hemorrhage affecting left non-dominant side: Secondary | ICD-10-CM | POA: Diagnosis not present

## 2020-10-05 DIAGNOSIS — R293 Abnormal posture: Secondary | ICD-10-CM | POA: Diagnosis not present

## 2020-10-05 DIAGNOSIS — M6281 Muscle weakness (generalized): Secondary | ICD-10-CM | POA: Diagnosis not present

## 2020-10-05 DIAGNOSIS — I69354 Hemiplegia and hemiparesis following cerebral infarction affecting left non-dominant side: Secondary | ICD-10-CM | POA: Diagnosis not present

## 2020-10-05 DIAGNOSIS — G894 Chronic pain syndrome: Secondary | ICD-10-CM | POA: Diagnosis not present

## 2020-10-05 DIAGNOSIS — I6330 Cerebral infarction due to thrombosis of unspecified cerebral artery: Secondary | ICD-10-CM | POA: Diagnosis not present

## 2020-10-06 DIAGNOSIS — G894 Chronic pain syndrome: Secondary | ICD-10-CM | POA: Diagnosis not present

## 2020-10-06 DIAGNOSIS — I69054 Hemiplegia and hemiparesis following nontraumatic subarachnoid hemorrhage affecting left non-dominant side: Secondary | ICD-10-CM | POA: Diagnosis not present

## 2020-10-06 DIAGNOSIS — R293 Abnormal posture: Secondary | ICD-10-CM | POA: Diagnosis not present

## 2020-10-06 DIAGNOSIS — I69354 Hemiplegia and hemiparesis following cerebral infarction affecting left non-dominant side: Secondary | ICD-10-CM | POA: Diagnosis not present

## 2020-10-06 DIAGNOSIS — M6281 Muscle weakness (generalized): Secondary | ICD-10-CM | POA: Diagnosis not present

## 2020-10-06 DIAGNOSIS — I6330 Cerebral infarction due to thrombosis of unspecified cerebral artery: Secondary | ICD-10-CM | POA: Diagnosis not present

## 2020-10-07 DIAGNOSIS — I69354 Hemiplegia and hemiparesis following cerebral infarction affecting left non-dominant side: Secondary | ICD-10-CM | POA: Diagnosis not present

## 2020-10-07 DIAGNOSIS — I6330 Cerebral infarction due to thrombosis of unspecified cerebral artery: Secondary | ICD-10-CM | POA: Diagnosis not present

## 2020-10-07 DIAGNOSIS — R293 Abnormal posture: Secondary | ICD-10-CM | POA: Diagnosis not present

## 2020-10-07 DIAGNOSIS — M6281 Muscle weakness (generalized): Secondary | ICD-10-CM | POA: Diagnosis not present

## 2020-10-07 DIAGNOSIS — G894 Chronic pain syndrome: Secondary | ICD-10-CM | POA: Diagnosis not present

## 2020-10-07 DIAGNOSIS — I69054 Hemiplegia and hemiparesis following nontraumatic subarachnoid hemorrhage affecting left non-dominant side: Secondary | ICD-10-CM | POA: Diagnosis not present

## 2020-10-08 DIAGNOSIS — I69054 Hemiplegia and hemiparesis following nontraumatic subarachnoid hemorrhage affecting left non-dominant side: Secondary | ICD-10-CM | POA: Diagnosis not present

## 2020-10-08 DIAGNOSIS — G894 Chronic pain syndrome: Secondary | ICD-10-CM | POA: Diagnosis not present

## 2020-10-08 DIAGNOSIS — I69354 Hemiplegia and hemiparesis following cerebral infarction affecting left non-dominant side: Secondary | ICD-10-CM | POA: Diagnosis not present

## 2020-10-08 DIAGNOSIS — R293 Abnormal posture: Secondary | ICD-10-CM | POA: Diagnosis not present

## 2020-10-08 DIAGNOSIS — I6330 Cerebral infarction due to thrombosis of unspecified cerebral artery: Secondary | ICD-10-CM | POA: Diagnosis not present

## 2020-10-08 DIAGNOSIS — M6281 Muscle weakness (generalized): Secondary | ICD-10-CM | POA: Diagnosis not present

## 2020-10-09 DIAGNOSIS — G894 Chronic pain syndrome: Secondary | ICD-10-CM | POA: Diagnosis not present

## 2020-10-09 DIAGNOSIS — M6281 Muscle weakness (generalized): Secondary | ICD-10-CM | POA: Diagnosis not present

## 2020-10-09 DIAGNOSIS — R293 Abnormal posture: Secondary | ICD-10-CM | POA: Diagnosis not present

## 2020-10-09 DIAGNOSIS — I69354 Hemiplegia and hemiparesis following cerebral infarction affecting left non-dominant side: Secondary | ICD-10-CM | POA: Diagnosis not present

## 2020-10-09 DIAGNOSIS — I69054 Hemiplegia and hemiparesis following nontraumatic subarachnoid hemorrhage affecting left non-dominant side: Secondary | ICD-10-CM | POA: Diagnosis not present

## 2020-10-09 DIAGNOSIS — I6330 Cerebral infarction due to thrombosis of unspecified cerebral artery: Secondary | ICD-10-CM | POA: Diagnosis not present

## 2020-10-10 DIAGNOSIS — I10 Essential (primary) hypertension: Secondary | ICD-10-CM | POA: Diagnosis not present

## 2020-10-10 DIAGNOSIS — I5032 Chronic diastolic (congestive) heart failure: Secondary | ICD-10-CM | POA: Diagnosis not present

## 2020-10-10 DIAGNOSIS — M6281 Muscle weakness (generalized): Secondary | ICD-10-CM | POA: Diagnosis not present

## 2020-10-10 DIAGNOSIS — R293 Abnormal posture: Secondary | ICD-10-CM | POA: Diagnosis not present

## 2020-10-10 DIAGNOSIS — E039 Hypothyroidism, unspecified: Secondary | ICD-10-CM | POA: Diagnosis not present

## 2020-10-10 DIAGNOSIS — E782 Mixed hyperlipidemia: Secondary | ICD-10-CM | POA: Diagnosis not present

## 2020-10-10 DIAGNOSIS — G8929 Other chronic pain: Secondary | ICD-10-CM | POA: Diagnosis not present

## 2020-10-10 DIAGNOSIS — I69054 Hemiplegia and hemiparesis following nontraumatic subarachnoid hemorrhage affecting left non-dominant side: Secondary | ICD-10-CM | POA: Diagnosis not present

## 2020-10-10 DIAGNOSIS — I69354 Hemiplegia and hemiparesis following cerebral infarction affecting left non-dominant side: Secondary | ICD-10-CM | POA: Diagnosis not present

## 2020-10-10 DIAGNOSIS — E1151 Type 2 diabetes mellitus with diabetic peripheral angiopathy without gangrene: Secondary | ICD-10-CM | POA: Diagnosis not present

## 2020-10-10 DIAGNOSIS — I4891 Unspecified atrial fibrillation: Secondary | ICD-10-CM | POA: Diagnosis not present

## 2020-10-10 DIAGNOSIS — I6330 Cerebral infarction due to thrombosis of unspecified cerebral artery: Secondary | ICD-10-CM | POA: Diagnosis not present

## 2020-10-10 DIAGNOSIS — G894 Chronic pain syndrome: Secondary | ICD-10-CM | POA: Diagnosis not present

## 2020-10-10 DIAGNOSIS — F329 Major depressive disorder, single episode, unspecified: Secondary | ICD-10-CM | POA: Diagnosis not present

## 2020-10-11 DIAGNOSIS — M6281 Muscle weakness (generalized): Secondary | ICD-10-CM | POA: Diagnosis not present

## 2020-10-11 DIAGNOSIS — G894 Chronic pain syndrome: Secondary | ICD-10-CM | POA: Diagnosis not present

## 2020-10-11 DIAGNOSIS — I6330 Cerebral infarction due to thrombosis of unspecified cerebral artery: Secondary | ICD-10-CM | POA: Diagnosis not present

## 2020-10-11 DIAGNOSIS — I69354 Hemiplegia and hemiparesis following cerebral infarction affecting left non-dominant side: Secondary | ICD-10-CM | POA: Diagnosis not present

## 2020-10-11 DIAGNOSIS — R293 Abnormal posture: Secondary | ICD-10-CM | POA: Diagnosis not present

## 2020-10-11 DIAGNOSIS — I69054 Hemiplegia and hemiparesis following nontraumatic subarachnoid hemorrhage affecting left non-dominant side: Secondary | ICD-10-CM | POA: Diagnosis not present

## 2020-10-12 DIAGNOSIS — M6281 Muscle weakness (generalized): Secondary | ICD-10-CM | POA: Diagnosis not present

## 2020-10-12 DIAGNOSIS — G894 Chronic pain syndrome: Secondary | ICD-10-CM | POA: Diagnosis not present

## 2020-10-12 DIAGNOSIS — I69354 Hemiplegia and hemiparesis following cerebral infarction affecting left non-dominant side: Secondary | ICD-10-CM | POA: Diagnosis not present

## 2020-10-12 DIAGNOSIS — I69054 Hemiplegia and hemiparesis following nontraumatic subarachnoid hemorrhage affecting left non-dominant side: Secondary | ICD-10-CM | POA: Diagnosis not present

## 2020-10-12 DIAGNOSIS — I6330 Cerebral infarction due to thrombosis of unspecified cerebral artery: Secondary | ICD-10-CM | POA: Diagnosis not present

## 2020-10-12 DIAGNOSIS — R293 Abnormal posture: Secondary | ICD-10-CM | POA: Diagnosis not present

## 2020-10-14 DIAGNOSIS — G894 Chronic pain syndrome: Secondary | ICD-10-CM | POA: Diagnosis not present

## 2020-10-14 DIAGNOSIS — R293 Abnormal posture: Secondary | ICD-10-CM | POA: Diagnosis not present

## 2020-10-14 DIAGNOSIS — M6281 Muscle weakness (generalized): Secondary | ICD-10-CM | POA: Diagnosis not present

## 2020-10-14 DIAGNOSIS — I6330 Cerebral infarction due to thrombosis of unspecified cerebral artery: Secondary | ICD-10-CM | POA: Diagnosis not present

## 2020-10-14 DIAGNOSIS — I69054 Hemiplegia and hemiparesis following nontraumatic subarachnoid hemorrhage affecting left non-dominant side: Secondary | ICD-10-CM | POA: Diagnosis not present

## 2020-10-14 DIAGNOSIS — I69354 Hemiplegia and hemiparesis following cerebral infarction affecting left non-dominant side: Secondary | ICD-10-CM | POA: Diagnosis not present

## 2020-10-15 DIAGNOSIS — I69054 Hemiplegia and hemiparesis following nontraumatic subarachnoid hemorrhage affecting left non-dominant side: Secondary | ICD-10-CM | POA: Diagnosis not present

## 2020-10-15 DIAGNOSIS — G894 Chronic pain syndrome: Secondary | ICD-10-CM | POA: Diagnosis not present

## 2020-10-15 DIAGNOSIS — R293 Abnormal posture: Secondary | ICD-10-CM | POA: Diagnosis not present

## 2020-10-15 DIAGNOSIS — M6281 Muscle weakness (generalized): Secondary | ICD-10-CM | POA: Diagnosis not present

## 2020-10-15 DIAGNOSIS — I6330 Cerebral infarction due to thrombosis of unspecified cerebral artery: Secondary | ICD-10-CM | POA: Diagnosis not present

## 2020-10-15 DIAGNOSIS — I69354 Hemiplegia and hemiparesis following cerebral infarction affecting left non-dominant side: Secondary | ICD-10-CM | POA: Diagnosis not present

## 2020-10-16 DIAGNOSIS — I69354 Hemiplegia and hemiparesis following cerebral infarction affecting left non-dominant side: Secondary | ICD-10-CM | POA: Diagnosis not present

## 2020-10-16 DIAGNOSIS — I6330 Cerebral infarction due to thrombosis of unspecified cerebral artery: Secondary | ICD-10-CM | POA: Diagnosis not present

## 2020-10-16 DIAGNOSIS — R293 Abnormal posture: Secondary | ICD-10-CM | POA: Diagnosis not present

## 2020-10-16 DIAGNOSIS — M6281 Muscle weakness (generalized): Secondary | ICD-10-CM | POA: Diagnosis not present

## 2020-10-16 DIAGNOSIS — G894 Chronic pain syndrome: Secondary | ICD-10-CM | POA: Diagnosis not present

## 2020-10-16 DIAGNOSIS — I69054 Hemiplegia and hemiparesis following nontraumatic subarachnoid hemorrhage affecting left non-dominant side: Secondary | ICD-10-CM | POA: Diagnosis not present

## 2020-10-17 DIAGNOSIS — I69054 Hemiplegia and hemiparesis following nontraumatic subarachnoid hemorrhage affecting left non-dominant side: Secondary | ICD-10-CM | POA: Diagnosis not present

## 2020-10-17 DIAGNOSIS — G894 Chronic pain syndrome: Secondary | ICD-10-CM | POA: Diagnosis not present

## 2020-10-17 DIAGNOSIS — I6330 Cerebral infarction due to thrombosis of unspecified cerebral artery: Secondary | ICD-10-CM | POA: Diagnosis not present

## 2020-10-17 DIAGNOSIS — R293 Abnormal posture: Secondary | ICD-10-CM | POA: Diagnosis not present

## 2020-10-17 DIAGNOSIS — M6281 Muscle weakness (generalized): Secondary | ICD-10-CM | POA: Diagnosis not present

## 2020-10-17 DIAGNOSIS — I69354 Hemiplegia and hemiparesis following cerebral infarction affecting left non-dominant side: Secondary | ICD-10-CM | POA: Diagnosis not present

## 2020-10-24 DIAGNOSIS — I6789 Other cerebrovascular disease: Secondary | ICD-10-CM | POA: Diagnosis not present

## 2020-10-24 DIAGNOSIS — Z889 Allergy status to unspecified drugs, medicaments and biological substances status: Secondary | ICD-10-CM | POA: Diagnosis not present

## 2020-10-24 DIAGNOSIS — I10 Essential (primary) hypertension: Secondary | ICD-10-CM | POA: Diagnosis not present

## 2020-10-24 DIAGNOSIS — Z794 Long term (current) use of insulin: Secondary | ICD-10-CM | POA: Diagnosis not present

## 2020-10-24 DIAGNOSIS — G8929 Other chronic pain: Secondary | ICD-10-CM | POA: Diagnosis not present

## 2020-10-24 DIAGNOSIS — I69054 Hemiplegia and hemiparesis following nontraumatic subarachnoid hemorrhage affecting left non-dominant side: Secondary | ICD-10-CM | POA: Diagnosis not present

## 2020-10-24 DIAGNOSIS — Z905 Acquired absence of kidney: Secondary | ICD-10-CM | POA: Diagnosis not present

## 2020-10-24 DIAGNOSIS — I4891 Unspecified atrial fibrillation: Secondary | ICD-10-CM | POA: Diagnosis not present

## 2020-10-24 DIAGNOSIS — E1151 Type 2 diabetes mellitus with diabetic peripheral angiopathy without gangrene: Secondary | ICD-10-CM | POA: Diagnosis not present

## 2020-10-24 DIAGNOSIS — E118 Type 2 diabetes mellitus with unspecified complications: Secondary | ICD-10-CM | POA: Diagnosis not present

## 2020-10-24 DIAGNOSIS — M6281 Muscle weakness (generalized): Secondary | ICD-10-CM | POA: Diagnosis not present

## 2020-10-24 DIAGNOSIS — F39 Unspecified mood [affective] disorder: Secondary | ICD-10-CM | POA: Diagnosis not present

## 2020-10-24 DIAGNOSIS — F329 Major depressive disorder, single episode, unspecified: Secondary | ICD-10-CM | POA: Diagnosis not present

## 2020-10-24 DIAGNOSIS — I5032 Chronic diastolic (congestive) heart failure: Secondary | ICD-10-CM | POA: Diagnosis not present

## 2020-10-24 DIAGNOSIS — F0150 Vascular dementia without behavioral disturbance: Secondary | ICD-10-CM | POA: Diagnosis not present

## 2020-10-24 DIAGNOSIS — I7789 Other specified disorders of arteries and arterioles: Secondary | ICD-10-CM | POA: Diagnosis not present

## 2020-10-24 DIAGNOSIS — I2510 Atherosclerotic heart disease of native coronary artery without angina pectoris: Secondary | ICD-10-CM | POA: Diagnosis not present

## 2020-10-24 DIAGNOSIS — E039 Hypothyroidism, unspecified: Secondary | ICD-10-CM | POA: Diagnosis not present

## 2020-10-24 DIAGNOSIS — E782 Mixed hyperlipidemia: Secondary | ICD-10-CM | POA: Diagnosis not present

## 2020-10-24 DIAGNOSIS — I480 Paroxysmal atrial fibrillation: Secondary | ICD-10-CM | POA: Diagnosis not present

## 2020-10-31 DIAGNOSIS — E1151 Type 2 diabetes mellitus with diabetic peripheral angiopathy without gangrene: Secondary | ICD-10-CM | POA: Diagnosis not present

## 2020-10-31 DIAGNOSIS — I69054 Hemiplegia and hemiparesis following nontraumatic subarachnoid hemorrhage affecting left non-dominant side: Secondary | ICD-10-CM | POA: Diagnosis not present

## 2020-10-31 DIAGNOSIS — F329 Major depressive disorder, single episode, unspecified: Secondary | ICD-10-CM | POA: Diagnosis not present

## 2020-10-31 DIAGNOSIS — M6281 Muscle weakness (generalized): Secondary | ICD-10-CM | POA: Diagnosis not present

## 2020-10-31 DIAGNOSIS — I5032 Chronic diastolic (congestive) heart failure: Secondary | ICD-10-CM | POA: Diagnosis not present

## 2020-10-31 DIAGNOSIS — E039 Hypothyroidism, unspecified: Secondary | ICD-10-CM | POA: Diagnosis not present

## 2020-10-31 DIAGNOSIS — I10 Essential (primary) hypertension: Secondary | ICD-10-CM | POA: Diagnosis not present

## 2020-10-31 DIAGNOSIS — E782 Mixed hyperlipidemia: Secondary | ICD-10-CM | POA: Diagnosis not present

## 2020-10-31 DIAGNOSIS — G8929 Other chronic pain: Secondary | ICD-10-CM | POA: Diagnosis not present

## 2020-10-31 DIAGNOSIS — I4891 Unspecified atrial fibrillation: Secondary | ICD-10-CM | POA: Diagnosis not present

## 2020-11-07 DIAGNOSIS — E039 Hypothyroidism, unspecified: Secondary | ICD-10-CM | POA: Diagnosis not present

## 2020-11-07 DIAGNOSIS — I10 Essential (primary) hypertension: Secondary | ICD-10-CM | POA: Diagnosis not present

## 2020-11-07 DIAGNOSIS — E1151 Type 2 diabetes mellitus with diabetic peripheral angiopathy without gangrene: Secondary | ICD-10-CM | POA: Diagnosis not present

## 2020-11-07 DIAGNOSIS — M6281 Muscle weakness (generalized): Secondary | ICD-10-CM | POA: Diagnosis not present

## 2020-11-07 DIAGNOSIS — F329 Major depressive disorder, single episode, unspecified: Secondary | ICD-10-CM | POA: Diagnosis not present

## 2020-11-07 DIAGNOSIS — G8929 Other chronic pain: Secondary | ICD-10-CM | POA: Diagnosis not present

## 2020-11-07 DIAGNOSIS — I69054 Hemiplegia and hemiparesis following nontraumatic subarachnoid hemorrhage affecting left non-dominant side: Secondary | ICD-10-CM | POA: Diagnosis not present

## 2020-11-07 DIAGNOSIS — I4891 Unspecified atrial fibrillation: Secondary | ICD-10-CM | POA: Diagnosis not present

## 2020-11-07 DIAGNOSIS — E782 Mixed hyperlipidemia: Secondary | ICD-10-CM | POA: Diagnosis not present

## 2020-11-07 DIAGNOSIS — I5032 Chronic diastolic (congestive) heart failure: Secondary | ICD-10-CM | POA: Diagnosis not present

## 2020-12-05 DIAGNOSIS — H6123 Impacted cerumen, bilateral: Secondary | ICD-10-CM | POA: Diagnosis not present

## 2020-12-12 DIAGNOSIS — R41841 Cognitive communication deficit: Secondary | ICD-10-CM | POA: Diagnosis not present

## 2020-12-12 DIAGNOSIS — M6281 Muscle weakness (generalized): Secondary | ICD-10-CM | POA: Diagnosis not present

## 2020-12-12 DIAGNOSIS — R1312 Dysphagia, oropharyngeal phase: Secondary | ICD-10-CM | POA: Diagnosis not present

## 2020-12-12 DIAGNOSIS — R4701 Aphasia: Secondary | ICD-10-CM | POA: Diagnosis not present

## 2020-12-12 DIAGNOSIS — I69054 Hemiplegia and hemiparesis following nontraumatic subarachnoid hemorrhage affecting left non-dominant side: Secondary | ICD-10-CM | POA: Diagnosis not present

## 2020-12-12 DIAGNOSIS — I6330 Cerebral infarction due to thrombosis of unspecified cerebral artery: Secondary | ICD-10-CM | POA: Diagnosis not present

## 2020-12-12 DIAGNOSIS — E440 Moderate protein-calorie malnutrition: Secondary | ICD-10-CM | POA: Diagnosis not present

## 2020-12-12 DIAGNOSIS — R2689 Other abnormalities of gait and mobility: Secondary | ICD-10-CM | POA: Diagnosis not present

## 2020-12-12 DIAGNOSIS — R278 Other lack of coordination: Secondary | ICD-10-CM | POA: Diagnosis not present

## 2020-12-12 DIAGNOSIS — E1159 Type 2 diabetes mellitus with other circulatory complications: Secondary | ICD-10-CM | POA: Diagnosis not present

## 2020-12-15 DIAGNOSIS — H6123 Impacted cerumen, bilateral: Secondary | ICD-10-CM | POA: Diagnosis not present

## 2020-12-18 DIAGNOSIS — F331 Major depressive disorder, recurrent, moderate: Secondary | ICD-10-CM | POA: Diagnosis not present

## 2020-12-18 DIAGNOSIS — F411 Generalized anxiety disorder: Secondary | ICD-10-CM | POA: Diagnosis not present

## 2020-12-21 DIAGNOSIS — F0150 Vascular dementia without behavioral disturbance: Secondary | ICD-10-CM | POA: Diagnosis not present

## 2020-12-21 DIAGNOSIS — I5032 Chronic diastolic (congestive) heart failure: Secondary | ICD-10-CM | POA: Diagnosis not present

## 2020-12-21 DIAGNOSIS — D649 Anemia, unspecified: Secondary | ICD-10-CM | POA: Diagnosis not present

## 2020-12-21 DIAGNOSIS — I480 Paroxysmal atrial fibrillation: Secondary | ICD-10-CM | POA: Diagnosis not present

## 2020-12-22 DIAGNOSIS — I6330 Cerebral infarction due to thrombosis of unspecified cerebral artery: Secondary | ICD-10-CM | POA: Diagnosis not present

## 2020-12-22 DIAGNOSIS — E1159 Type 2 diabetes mellitus with other circulatory complications: Secondary | ICD-10-CM | POA: Diagnosis not present

## 2020-12-22 DIAGNOSIS — R4182 Altered mental status, unspecified: Secondary | ICD-10-CM | POA: Diagnosis not present

## 2020-12-25 DIAGNOSIS — N390 Urinary tract infection, site not specified: Secondary | ICD-10-CM | POA: Diagnosis not present

## 2020-12-27 DIAGNOSIS — N390 Urinary tract infection, site not specified: Secondary | ICD-10-CM | POA: Diagnosis not present

## 2020-12-27 DIAGNOSIS — B9629 Other Escherichia coli [E. coli] as the cause of diseases classified elsewhere: Secondary | ICD-10-CM | POA: Diagnosis not present

## 2020-12-27 DIAGNOSIS — Z1612 Extended spectrum beta lactamase (ESBL) resistance: Secondary | ICD-10-CM | POA: Diagnosis not present

## 2020-12-27 DIAGNOSIS — H6593 Unspecified nonsuppurative otitis media, bilateral: Secondary | ICD-10-CM | POA: Diagnosis not present

## 2021-01-01 DIAGNOSIS — F0390 Unspecified dementia without behavioral disturbance: Secondary | ICD-10-CM | POA: Diagnosis not present

## 2021-01-01 DIAGNOSIS — F331 Major depressive disorder, recurrent, moderate: Secondary | ICD-10-CM | POA: Diagnosis not present

## 2021-01-01 DIAGNOSIS — F411 Generalized anxiety disorder: Secondary | ICD-10-CM | POA: Diagnosis not present

## 2021-01-09 DIAGNOSIS — G8190 Hemiplegia, unspecified affecting unspecified side: Secondary | ICD-10-CM | POA: Diagnosis not present

## 2021-01-09 DIAGNOSIS — R4701 Aphasia: Secondary | ICD-10-CM | POA: Diagnosis not present

## 2021-01-09 DIAGNOSIS — I480 Paroxysmal atrial fibrillation: Secondary | ICD-10-CM | POA: Diagnosis not present

## 2021-01-09 DIAGNOSIS — R41841 Cognitive communication deficit: Secondary | ICD-10-CM | POA: Diagnosis not present

## 2021-01-09 DIAGNOSIS — R2689 Other abnormalities of gait and mobility: Secondary | ICD-10-CM | POA: Diagnosis not present

## 2021-01-09 DIAGNOSIS — R278 Other lack of coordination: Secondary | ICD-10-CM | POA: Diagnosis not present

## 2021-01-09 DIAGNOSIS — R1312 Dysphagia, oropharyngeal phase: Secondary | ICD-10-CM | POA: Diagnosis not present

## 2021-01-09 DIAGNOSIS — I779 Disorder of arteries and arterioles, unspecified: Secondary | ICD-10-CM | POA: Diagnosis not present

## 2021-01-09 DIAGNOSIS — I69054 Hemiplegia and hemiparesis following nontraumatic subarachnoid hemorrhage affecting left non-dominant side: Secondary | ICD-10-CM | POA: Diagnosis not present

## 2021-01-09 DIAGNOSIS — E1159 Type 2 diabetes mellitus with other circulatory complications: Secondary | ICD-10-CM | POA: Diagnosis not present

## 2021-01-09 DIAGNOSIS — I6330 Cerebral infarction due to thrombosis of unspecified cerebral artery: Secondary | ICD-10-CM | POA: Diagnosis not present

## 2021-01-09 DIAGNOSIS — E440 Moderate protein-calorie malnutrition: Secondary | ICD-10-CM | POA: Diagnosis not present

## 2021-01-09 DIAGNOSIS — I2510 Atherosclerotic heart disease of native coronary artery without angina pectoris: Secondary | ICD-10-CM | POA: Diagnosis not present

## 2021-01-09 DIAGNOSIS — M6281 Muscle weakness (generalized): Secondary | ICD-10-CM | POA: Diagnosis not present

## 2021-02-04 DIAGNOSIS — I69054 Hemiplegia and hemiparesis following nontraumatic subarachnoid hemorrhage affecting left non-dominant side: Secondary | ICD-10-CM | POA: Diagnosis not present

## 2021-02-04 DIAGNOSIS — M6281 Muscle weakness (generalized): Secondary | ICD-10-CM | POA: Diagnosis not present

## 2021-02-04 DIAGNOSIS — R2689 Other abnormalities of gait and mobility: Secondary | ICD-10-CM | POA: Diagnosis not present

## 2021-02-04 DIAGNOSIS — R1312 Dysphagia, oropharyngeal phase: Secondary | ICD-10-CM | POA: Diagnosis not present

## 2021-02-04 DIAGNOSIS — M24542 Contracture, left hand: Secondary | ICD-10-CM | POA: Diagnosis not present

## 2021-02-04 DIAGNOSIS — I69354 Hemiplegia and hemiparesis following cerebral infarction affecting left non-dominant side: Secondary | ICD-10-CM | POA: Diagnosis not present

## 2021-02-04 DIAGNOSIS — R293 Abnormal posture: Secondary | ICD-10-CM | POA: Diagnosis not present

## 2021-02-04 DIAGNOSIS — M25512 Pain in left shoulder: Secondary | ICD-10-CM | POA: Diagnosis not present

## 2021-02-04 DIAGNOSIS — R41841 Cognitive communication deficit: Secondary | ICD-10-CM | POA: Diagnosis not present

## 2021-02-04 DIAGNOSIS — R278 Other lack of coordination: Secondary | ICD-10-CM | POA: Diagnosis not present

## 2021-02-15 DIAGNOSIS — I5032 Chronic diastolic (congestive) heart failure: Secondary | ICD-10-CM | POA: Diagnosis not present

## 2021-02-15 DIAGNOSIS — F39 Unspecified mood [affective] disorder: Secondary | ICD-10-CM | POA: Diagnosis not present

## 2021-02-15 DIAGNOSIS — G8929 Other chronic pain: Secondary | ICD-10-CM | POA: Diagnosis not present

## 2021-02-15 DIAGNOSIS — I6389 Other cerebral infarction: Secondary | ICD-10-CM | POA: Diagnosis not present

## 2021-02-15 DIAGNOSIS — R269 Unspecified abnormalities of gait and mobility: Secondary | ICD-10-CM | POA: Diagnosis not present

## 2021-02-15 DIAGNOSIS — M25512 Pain in left shoulder: Secondary | ICD-10-CM | POA: Diagnosis not present

## 2021-02-15 DIAGNOSIS — G4720 Circadian rhythm sleep disorder, unspecified type: Secondary | ICD-10-CM | POA: Diagnosis not present

## 2021-02-15 DIAGNOSIS — I2510 Atherosclerotic heart disease of native coronary artery without angina pectoris: Secondary | ICD-10-CM | POA: Diagnosis not present

## 2021-02-15 DIAGNOSIS — Z794 Long term (current) use of insulin: Secondary | ICD-10-CM | POA: Diagnosis not present

## 2021-02-15 DIAGNOSIS — I480 Paroxysmal atrial fibrillation: Secondary | ICD-10-CM | POA: Diagnosis not present

## 2021-03-15 DIAGNOSIS — I152 Hypertension secondary to endocrine disorders: Secondary | ICD-10-CM | POA: Diagnosis not present

## 2021-03-15 DIAGNOSIS — E1159 Type 2 diabetes mellitus with other circulatory complications: Secondary | ICD-10-CM | POA: Diagnosis not present

## 2021-03-15 DIAGNOSIS — Z789 Other specified health status: Secondary | ICD-10-CM | POA: Diagnosis not present

## 2021-03-15 DIAGNOSIS — I110 Hypertensive heart disease with heart failure: Secondary | ICD-10-CM | POA: Diagnosis not present

## 2021-03-26 DIAGNOSIS — F411 Generalized anxiety disorder: Secondary | ICD-10-CM | POA: Diagnosis not present

## 2021-03-26 DIAGNOSIS — I69928 Other speech and language deficits following unspecified cerebrovascular disease: Secondary | ICD-10-CM | POA: Diagnosis not present

## 2021-03-26 DIAGNOSIS — I69054 Hemiplegia and hemiparesis following nontraumatic subarachnoid hemorrhage affecting left non-dominant side: Secondary | ICD-10-CM | POA: Diagnosis not present

## 2021-03-26 DIAGNOSIS — M6281 Muscle weakness (generalized): Secondary | ICD-10-CM | POA: Diagnosis not present

## 2021-03-26 DIAGNOSIS — F0390 Unspecified dementia without behavioral disturbance: Secondary | ICD-10-CM | POA: Diagnosis not present

## 2021-03-26 DIAGNOSIS — R293 Abnormal posture: Secondary | ICD-10-CM | POA: Diagnosis not present

## 2021-03-26 DIAGNOSIS — M24542 Contracture, left hand: Secondary | ICD-10-CM | POA: Diagnosis not present

## 2021-03-26 DIAGNOSIS — R2689 Other abnormalities of gait and mobility: Secondary | ICD-10-CM | POA: Diagnosis not present

## 2021-03-26 DIAGNOSIS — I69354 Hemiplegia and hemiparesis following cerebral infarction affecting left non-dominant side: Secondary | ICD-10-CM | POA: Diagnosis not present

## 2021-03-26 DIAGNOSIS — I6330 Cerebral infarction due to thrombosis of unspecified cerebral artery: Secondary | ICD-10-CM | POA: Diagnosis not present

## 2021-03-26 DIAGNOSIS — M25512 Pain in left shoulder: Secondary | ICD-10-CM | POA: Diagnosis not present

## 2021-03-26 DIAGNOSIS — F331 Major depressive disorder, recurrent, moderate: Secondary | ICD-10-CM | POA: Diagnosis not present

## 2021-03-26 DIAGNOSIS — R278 Other lack of coordination: Secondary | ICD-10-CM | POA: Diagnosis not present

## 2021-05-16 DIAGNOSIS — E440 Moderate protein-calorie malnutrition: Secondary | ICD-10-CM | POA: Diagnosis not present

## 2021-05-16 DIAGNOSIS — E1159 Type 2 diabetes mellitus with other circulatory complications: Secondary | ICD-10-CM | POA: Diagnosis not present

## 2021-05-16 DIAGNOSIS — I5032 Chronic diastolic (congestive) heart failure: Secondary | ICD-10-CM | POA: Diagnosis not present

## 2021-05-16 DIAGNOSIS — E559 Vitamin D deficiency, unspecified: Secondary | ICD-10-CM | POA: Diagnosis not present

## 2021-06-24 DIAGNOSIS — R278 Other lack of coordination: Secondary | ICD-10-CM | POA: Diagnosis not present

## 2021-06-24 DIAGNOSIS — R2689 Other abnormalities of gait and mobility: Secondary | ICD-10-CM | POA: Diagnosis not present

## 2021-06-24 DIAGNOSIS — M25512 Pain in left shoulder: Secondary | ICD-10-CM | POA: Diagnosis not present

## 2021-06-24 DIAGNOSIS — I69354 Hemiplegia and hemiparesis following cerebral infarction affecting left non-dominant side: Secondary | ICD-10-CM | POA: Diagnosis not present

## 2021-06-24 DIAGNOSIS — I69054 Hemiplegia and hemiparesis following nontraumatic subarachnoid hemorrhage affecting left non-dominant side: Secondary | ICD-10-CM | POA: Diagnosis not present

## 2021-06-24 DIAGNOSIS — M24542 Contracture, left hand: Secondary | ICD-10-CM | POA: Diagnosis not present

## 2021-06-24 DIAGNOSIS — R293 Abnormal posture: Secondary | ICD-10-CM | POA: Diagnosis not present

## 2021-06-24 DIAGNOSIS — M6281 Muscle weakness (generalized): Secondary | ICD-10-CM | POA: Diagnosis not present

## 2021-06-24 DIAGNOSIS — I6330 Cerebral infarction due to thrombosis of unspecified cerebral artery: Secondary | ICD-10-CM | POA: Diagnosis not present

## 2021-06-24 DIAGNOSIS — I69928 Other speech and language deficits following unspecified cerebrovascular disease: Secondary | ICD-10-CM | POA: Diagnosis not present

## 2021-07-06 DIAGNOSIS — I69354 Hemiplegia and hemiparesis following cerebral infarction affecting left non-dominant side: Secondary | ICD-10-CM | POA: Diagnosis not present

## 2021-07-06 DIAGNOSIS — M24542 Contracture, left hand: Secondary | ICD-10-CM | POA: Diagnosis not present

## 2021-07-06 DIAGNOSIS — R278 Other lack of coordination: Secondary | ICD-10-CM | POA: Diagnosis not present

## 2021-07-06 DIAGNOSIS — I69928 Other speech and language deficits following unspecified cerebrovascular disease: Secondary | ICD-10-CM | POA: Diagnosis not present

## 2021-07-06 DIAGNOSIS — M6281 Muscle weakness (generalized): Secondary | ICD-10-CM | POA: Diagnosis not present

## 2021-07-06 DIAGNOSIS — I69054 Hemiplegia and hemiparesis following nontraumatic subarachnoid hemorrhage affecting left non-dominant side: Secondary | ICD-10-CM | POA: Diagnosis not present

## 2021-07-06 DIAGNOSIS — R293 Abnormal posture: Secondary | ICD-10-CM | POA: Diagnosis not present

## 2021-07-06 DIAGNOSIS — I6330 Cerebral infarction due to thrombosis of unspecified cerebral artery: Secondary | ICD-10-CM | POA: Diagnosis not present

## 2021-07-06 DIAGNOSIS — R2689 Other abnormalities of gait and mobility: Secondary | ICD-10-CM | POA: Diagnosis not present

## 2021-07-06 DIAGNOSIS — M25512 Pain in left shoulder: Secondary | ICD-10-CM | POA: Diagnosis not present

## 2021-09-17 DIAGNOSIS — R1312 Dysphagia, oropharyngeal phase: Secondary | ICD-10-CM | POA: Diagnosis not present

## 2021-09-17 DIAGNOSIS — I69054 Hemiplegia and hemiparesis following nontraumatic subarachnoid hemorrhage affecting left non-dominant side: Secondary | ICD-10-CM | POA: Diagnosis not present

## 2021-09-17 DIAGNOSIS — M24542 Contracture, left hand: Secondary | ICD-10-CM | POA: Diagnosis not present

## 2021-09-17 DIAGNOSIS — I6330 Cerebral infarction due to thrombosis of unspecified cerebral artery: Secondary | ICD-10-CM | POA: Diagnosis not present

## 2021-09-17 DIAGNOSIS — M25512 Pain in left shoulder: Secondary | ICD-10-CM | POA: Diagnosis not present

## 2021-09-17 DIAGNOSIS — R278 Other lack of coordination: Secondary | ICD-10-CM | POA: Diagnosis not present

## 2021-09-17 DIAGNOSIS — R293 Abnormal posture: Secondary | ICD-10-CM | POA: Diagnosis not present

## 2021-09-17 DIAGNOSIS — I69928 Other speech and language deficits following unspecified cerebrovascular disease: Secondary | ICD-10-CM | POA: Diagnosis not present

## 2021-09-17 DIAGNOSIS — I69354 Hemiplegia and hemiparesis following cerebral infarction affecting left non-dominant side: Secondary | ICD-10-CM | POA: Diagnosis not present

## 2021-09-17 DIAGNOSIS — M6281 Muscle weakness (generalized): Secondary | ICD-10-CM | POA: Diagnosis not present

## 2021-09-20 DIAGNOSIS — I6330 Cerebral infarction due to thrombosis of unspecified cerebral artery: Secondary | ICD-10-CM | POA: Diagnosis not present

## 2021-09-20 DIAGNOSIS — I69354 Hemiplegia and hemiparesis following cerebral infarction affecting left non-dominant side: Secondary | ICD-10-CM | POA: Diagnosis not present

## 2021-09-20 DIAGNOSIS — M6281 Muscle weakness (generalized): Secondary | ICD-10-CM | POA: Diagnosis not present

## 2021-09-20 DIAGNOSIS — I69054 Hemiplegia and hemiparesis following nontraumatic subarachnoid hemorrhage affecting left non-dominant side: Secondary | ICD-10-CM | POA: Diagnosis not present

## 2021-09-20 DIAGNOSIS — M25512 Pain in left shoulder: Secondary | ICD-10-CM | POA: Diagnosis not present

## 2021-09-20 DIAGNOSIS — R1312 Dysphagia, oropharyngeal phase: Secondary | ICD-10-CM | POA: Diagnosis not present

## 2021-09-20 DIAGNOSIS — I69928 Other speech and language deficits following unspecified cerebrovascular disease: Secondary | ICD-10-CM | POA: Diagnosis not present

## 2021-09-20 DIAGNOSIS — R293 Abnormal posture: Secondary | ICD-10-CM | POA: Diagnosis not present

## 2021-09-20 DIAGNOSIS — M24542 Contracture, left hand: Secondary | ICD-10-CM | POA: Diagnosis not present

## 2021-09-20 DIAGNOSIS — R278 Other lack of coordination: Secondary | ICD-10-CM | POA: Diagnosis not present

## 2021-09-24 DIAGNOSIS — M25512 Pain in left shoulder: Secondary | ICD-10-CM | POA: Diagnosis not present

## 2021-09-24 DIAGNOSIS — R278 Other lack of coordination: Secondary | ICD-10-CM | POA: Diagnosis not present

## 2021-09-24 DIAGNOSIS — I6330 Cerebral infarction due to thrombosis of unspecified cerebral artery: Secondary | ICD-10-CM | POA: Diagnosis not present

## 2021-09-24 DIAGNOSIS — M6281 Muscle weakness (generalized): Secondary | ICD-10-CM | POA: Diagnosis not present

## 2021-09-24 DIAGNOSIS — R293 Abnormal posture: Secondary | ICD-10-CM | POA: Diagnosis not present

## 2021-09-24 DIAGNOSIS — F331 Major depressive disorder, recurrent, moderate: Secondary | ICD-10-CM | POA: Diagnosis not present

## 2021-09-24 DIAGNOSIS — M24542 Contracture, left hand: Secondary | ICD-10-CM | POA: Diagnosis not present

## 2021-09-24 DIAGNOSIS — F0390 Unspecified dementia without behavioral disturbance: Secondary | ICD-10-CM | POA: Diagnosis not present

## 2021-09-24 DIAGNOSIS — I69054 Hemiplegia and hemiparesis following nontraumatic subarachnoid hemorrhage affecting left non-dominant side: Secondary | ICD-10-CM | POA: Diagnosis not present

## 2021-09-24 DIAGNOSIS — R1312 Dysphagia, oropharyngeal phase: Secondary | ICD-10-CM | POA: Diagnosis not present

## 2021-09-24 DIAGNOSIS — I69928 Other speech and language deficits following unspecified cerebrovascular disease: Secondary | ICD-10-CM | POA: Diagnosis not present

## 2021-09-24 DIAGNOSIS — F411 Generalized anxiety disorder: Secondary | ICD-10-CM | POA: Diagnosis not present

## 2021-09-24 DIAGNOSIS — I69354 Hemiplegia and hemiparesis following cerebral infarction affecting left non-dominant side: Secondary | ICD-10-CM | POA: Diagnosis not present

## 2021-09-25 DIAGNOSIS — I69354 Hemiplegia and hemiparesis following cerebral infarction affecting left non-dominant side: Secondary | ICD-10-CM | POA: Diagnosis not present

## 2021-09-25 DIAGNOSIS — M25512 Pain in left shoulder: Secondary | ICD-10-CM | POA: Diagnosis not present

## 2021-09-25 DIAGNOSIS — R0989 Other specified symptoms and signs involving the circulatory and respiratory systems: Secondary | ICD-10-CM | POA: Diagnosis not present

## 2021-09-25 DIAGNOSIS — R5383 Other fatigue: Secondary | ICD-10-CM | POA: Diagnosis not present

## 2021-09-25 DIAGNOSIS — I69054 Hemiplegia and hemiparesis following nontraumatic subarachnoid hemorrhage affecting left non-dominant side: Secondary | ICD-10-CM | POA: Diagnosis not present

## 2021-09-25 DIAGNOSIS — R1312 Dysphagia, oropharyngeal phase: Secondary | ICD-10-CM | POA: Diagnosis not present

## 2021-09-25 DIAGNOSIS — R278 Other lack of coordination: Secondary | ICD-10-CM | POA: Diagnosis not present

## 2021-09-25 DIAGNOSIS — R293 Abnormal posture: Secondary | ICD-10-CM | POA: Diagnosis not present

## 2021-09-25 DIAGNOSIS — M24542 Contracture, left hand: Secondary | ICD-10-CM | POA: Diagnosis not present

## 2021-09-25 DIAGNOSIS — Z0189 Encounter for other specified special examinations: Secondary | ICD-10-CM | POA: Diagnosis not present

## 2021-09-25 DIAGNOSIS — E119 Type 2 diabetes mellitus without complications: Secondary | ICD-10-CM | POA: Diagnosis not present

## 2021-09-25 DIAGNOSIS — M6281 Muscle weakness (generalized): Secondary | ICD-10-CM | POA: Diagnosis not present

## 2021-09-25 DIAGNOSIS — I6330 Cerebral infarction due to thrombosis of unspecified cerebral artery: Secondary | ICD-10-CM | POA: Diagnosis not present

## 2021-09-25 DIAGNOSIS — I69928 Other speech and language deficits following unspecified cerebrovascular disease: Secondary | ICD-10-CM | POA: Diagnosis not present

## 2021-09-26 DIAGNOSIS — R278 Other lack of coordination: Secondary | ICD-10-CM | POA: Diagnosis not present

## 2021-09-26 DIAGNOSIS — R1312 Dysphagia, oropharyngeal phase: Secondary | ICD-10-CM | POA: Diagnosis not present

## 2021-09-26 DIAGNOSIS — M25512 Pain in left shoulder: Secondary | ICD-10-CM | POA: Diagnosis not present

## 2021-09-26 DIAGNOSIS — I69054 Hemiplegia and hemiparesis following nontraumatic subarachnoid hemorrhage affecting left non-dominant side: Secondary | ICD-10-CM | POA: Diagnosis not present

## 2021-09-26 DIAGNOSIS — I6330 Cerebral infarction due to thrombosis of unspecified cerebral artery: Secondary | ICD-10-CM | POA: Diagnosis not present

## 2021-09-26 DIAGNOSIS — M6281 Muscle weakness (generalized): Secondary | ICD-10-CM | POA: Diagnosis not present

## 2021-09-26 DIAGNOSIS — I69354 Hemiplegia and hemiparesis following cerebral infarction affecting left non-dominant side: Secondary | ICD-10-CM | POA: Diagnosis not present

## 2021-09-26 DIAGNOSIS — R293 Abnormal posture: Secondary | ICD-10-CM | POA: Diagnosis not present

## 2021-09-26 DIAGNOSIS — I69928 Other speech and language deficits following unspecified cerebrovascular disease: Secondary | ICD-10-CM | POA: Diagnosis not present

## 2021-09-26 DIAGNOSIS — M24542 Contracture, left hand: Secondary | ICD-10-CM | POA: Diagnosis not present

## 2021-09-27 DIAGNOSIS — R1312 Dysphagia, oropharyngeal phase: Secondary | ICD-10-CM | POA: Diagnosis not present

## 2021-09-27 DIAGNOSIS — I69928 Other speech and language deficits following unspecified cerebrovascular disease: Secondary | ICD-10-CM | POA: Diagnosis not present

## 2021-09-27 DIAGNOSIS — R293 Abnormal posture: Secondary | ICD-10-CM | POA: Diagnosis not present

## 2021-09-27 DIAGNOSIS — I6330 Cerebral infarction due to thrombosis of unspecified cerebral artery: Secondary | ICD-10-CM | POA: Diagnosis not present

## 2021-09-27 DIAGNOSIS — M6281 Muscle weakness (generalized): Secondary | ICD-10-CM | POA: Diagnosis not present

## 2021-09-27 DIAGNOSIS — R278 Other lack of coordination: Secondary | ICD-10-CM | POA: Diagnosis not present

## 2021-09-27 DIAGNOSIS — M24542 Contracture, left hand: Secondary | ICD-10-CM | POA: Diagnosis not present

## 2021-09-27 DIAGNOSIS — M25512 Pain in left shoulder: Secondary | ICD-10-CM | POA: Diagnosis not present

## 2021-09-27 DIAGNOSIS — I69354 Hemiplegia and hemiparesis following cerebral infarction affecting left non-dominant side: Secondary | ICD-10-CM | POA: Diagnosis not present

## 2021-09-27 DIAGNOSIS — I69054 Hemiplegia and hemiparesis following nontraumatic subarachnoid hemorrhage affecting left non-dominant side: Secondary | ICD-10-CM | POA: Diagnosis not present

## 2021-09-28 DIAGNOSIS — M24542 Contracture, left hand: Secondary | ICD-10-CM | POA: Diagnosis not present

## 2021-09-28 DIAGNOSIS — R293 Abnormal posture: Secondary | ICD-10-CM | POA: Diagnosis not present

## 2021-09-28 DIAGNOSIS — I69054 Hemiplegia and hemiparesis following nontraumatic subarachnoid hemorrhage affecting left non-dominant side: Secondary | ICD-10-CM | POA: Diagnosis not present

## 2021-09-28 DIAGNOSIS — I69928 Other speech and language deficits following unspecified cerebrovascular disease: Secondary | ICD-10-CM | POA: Diagnosis not present

## 2021-09-28 DIAGNOSIS — M6281 Muscle weakness (generalized): Secondary | ICD-10-CM | POA: Diagnosis not present

## 2021-09-28 DIAGNOSIS — R1312 Dysphagia, oropharyngeal phase: Secondary | ICD-10-CM | POA: Diagnosis not present

## 2021-09-28 DIAGNOSIS — R278 Other lack of coordination: Secondary | ICD-10-CM | POA: Diagnosis not present

## 2021-09-28 DIAGNOSIS — I6330 Cerebral infarction due to thrombosis of unspecified cerebral artery: Secondary | ICD-10-CM | POA: Diagnosis not present

## 2021-09-28 DIAGNOSIS — I69354 Hemiplegia and hemiparesis following cerebral infarction affecting left non-dominant side: Secondary | ICD-10-CM | POA: Diagnosis not present

## 2021-09-28 DIAGNOSIS — M25512 Pain in left shoulder: Secondary | ICD-10-CM | POA: Diagnosis not present

## 2021-10-01 DIAGNOSIS — I69054 Hemiplegia and hemiparesis following nontraumatic subarachnoid hemorrhage affecting left non-dominant side: Secondary | ICD-10-CM | POA: Diagnosis not present

## 2021-10-01 DIAGNOSIS — I69928 Other speech and language deficits following unspecified cerebrovascular disease: Secondary | ICD-10-CM | POA: Diagnosis not present

## 2021-10-01 DIAGNOSIS — M25512 Pain in left shoulder: Secondary | ICD-10-CM | POA: Diagnosis not present

## 2021-10-01 DIAGNOSIS — I69354 Hemiplegia and hemiparesis following cerebral infarction affecting left non-dominant side: Secondary | ICD-10-CM | POA: Diagnosis not present

## 2021-10-01 DIAGNOSIS — R1312 Dysphagia, oropharyngeal phase: Secondary | ICD-10-CM | POA: Diagnosis not present

## 2021-10-01 DIAGNOSIS — R278 Other lack of coordination: Secondary | ICD-10-CM | POA: Diagnosis not present

## 2021-10-01 DIAGNOSIS — M6281 Muscle weakness (generalized): Secondary | ICD-10-CM | POA: Diagnosis not present

## 2021-10-01 DIAGNOSIS — R293 Abnormal posture: Secondary | ICD-10-CM | POA: Diagnosis not present

## 2021-10-01 DIAGNOSIS — M24542 Contracture, left hand: Secondary | ICD-10-CM | POA: Diagnosis not present

## 2021-10-01 DIAGNOSIS — I6330 Cerebral infarction due to thrombosis of unspecified cerebral artery: Secondary | ICD-10-CM | POA: Diagnosis not present

## 2021-10-02 DIAGNOSIS — R293 Abnormal posture: Secondary | ICD-10-CM | POA: Diagnosis not present

## 2021-10-02 DIAGNOSIS — M6281 Muscle weakness (generalized): Secondary | ICD-10-CM | POA: Diagnosis not present

## 2021-10-02 DIAGNOSIS — I69054 Hemiplegia and hemiparesis following nontraumatic subarachnoid hemorrhage affecting left non-dominant side: Secondary | ICD-10-CM | POA: Diagnosis not present

## 2021-10-02 DIAGNOSIS — M25512 Pain in left shoulder: Secondary | ICD-10-CM | POA: Diagnosis not present

## 2021-10-02 DIAGNOSIS — I69354 Hemiplegia and hemiparesis following cerebral infarction affecting left non-dominant side: Secondary | ICD-10-CM | POA: Diagnosis not present

## 2021-10-02 DIAGNOSIS — I69928 Other speech and language deficits following unspecified cerebrovascular disease: Secondary | ICD-10-CM | POA: Diagnosis not present

## 2021-10-02 DIAGNOSIS — R1312 Dysphagia, oropharyngeal phase: Secondary | ICD-10-CM | POA: Diagnosis not present

## 2021-10-02 DIAGNOSIS — R278 Other lack of coordination: Secondary | ICD-10-CM | POA: Diagnosis not present

## 2021-10-02 DIAGNOSIS — I6330 Cerebral infarction due to thrombosis of unspecified cerebral artery: Secondary | ICD-10-CM | POA: Diagnosis not present

## 2021-10-02 DIAGNOSIS — M24542 Contracture, left hand: Secondary | ICD-10-CM | POA: Diagnosis not present

## 2021-10-03 DIAGNOSIS — R293 Abnormal posture: Secondary | ICD-10-CM | POA: Diagnosis not present

## 2021-10-03 DIAGNOSIS — I69354 Hemiplegia and hemiparesis following cerebral infarction affecting left non-dominant side: Secondary | ICD-10-CM | POA: Diagnosis not present

## 2021-10-03 DIAGNOSIS — I69928 Other speech and language deficits following unspecified cerebrovascular disease: Secondary | ICD-10-CM | POA: Diagnosis not present

## 2021-10-03 DIAGNOSIS — I69054 Hemiplegia and hemiparesis following nontraumatic subarachnoid hemorrhage affecting left non-dominant side: Secondary | ICD-10-CM | POA: Diagnosis not present

## 2021-10-03 DIAGNOSIS — R278 Other lack of coordination: Secondary | ICD-10-CM | POA: Diagnosis not present

## 2021-10-03 DIAGNOSIS — M25512 Pain in left shoulder: Secondary | ICD-10-CM | POA: Diagnosis not present

## 2021-10-03 DIAGNOSIS — M6281 Muscle weakness (generalized): Secondary | ICD-10-CM | POA: Diagnosis not present

## 2021-10-03 DIAGNOSIS — M24542 Contracture, left hand: Secondary | ICD-10-CM | POA: Diagnosis not present

## 2021-10-03 DIAGNOSIS — I6330 Cerebral infarction due to thrombosis of unspecified cerebral artery: Secondary | ICD-10-CM | POA: Diagnosis not present

## 2021-10-03 DIAGNOSIS — R1312 Dysphagia, oropharyngeal phase: Secondary | ICD-10-CM | POA: Diagnosis not present

## 2021-10-04 DIAGNOSIS — I69928 Other speech and language deficits following unspecified cerebrovascular disease: Secondary | ICD-10-CM | POA: Diagnosis not present

## 2021-10-04 DIAGNOSIS — R278 Other lack of coordination: Secondary | ICD-10-CM | POA: Diagnosis not present

## 2021-10-04 DIAGNOSIS — I6330 Cerebral infarction due to thrombosis of unspecified cerebral artery: Secondary | ICD-10-CM | POA: Diagnosis not present

## 2021-10-04 DIAGNOSIS — R293 Abnormal posture: Secondary | ICD-10-CM | POA: Diagnosis not present

## 2021-10-04 DIAGNOSIS — I69354 Hemiplegia and hemiparesis following cerebral infarction affecting left non-dominant side: Secondary | ICD-10-CM | POA: Diagnosis not present

## 2021-10-04 DIAGNOSIS — I69054 Hemiplegia and hemiparesis following nontraumatic subarachnoid hemorrhage affecting left non-dominant side: Secondary | ICD-10-CM | POA: Diagnosis not present

## 2021-10-04 DIAGNOSIS — M25512 Pain in left shoulder: Secondary | ICD-10-CM | POA: Diagnosis not present

## 2021-10-04 DIAGNOSIS — M24542 Contracture, left hand: Secondary | ICD-10-CM | POA: Diagnosis not present

## 2021-10-04 DIAGNOSIS — M6281 Muscle weakness (generalized): Secondary | ICD-10-CM | POA: Diagnosis not present

## 2021-10-04 DIAGNOSIS — R1312 Dysphagia, oropharyngeal phase: Secondary | ICD-10-CM | POA: Diagnosis not present

## 2021-10-05 DIAGNOSIS — R293 Abnormal posture: Secondary | ICD-10-CM | POA: Diagnosis not present

## 2021-10-05 DIAGNOSIS — M6281 Muscle weakness (generalized): Secondary | ICD-10-CM | POA: Diagnosis not present

## 2021-10-05 DIAGNOSIS — R1312 Dysphagia, oropharyngeal phase: Secondary | ICD-10-CM | POA: Diagnosis not present

## 2021-10-05 DIAGNOSIS — M25512 Pain in left shoulder: Secondary | ICD-10-CM | POA: Diagnosis not present

## 2021-10-05 DIAGNOSIS — M24542 Contracture, left hand: Secondary | ICD-10-CM | POA: Diagnosis not present

## 2021-10-05 DIAGNOSIS — I69354 Hemiplegia and hemiparesis following cerebral infarction affecting left non-dominant side: Secondary | ICD-10-CM | POA: Diagnosis not present

## 2021-10-05 DIAGNOSIS — R278 Other lack of coordination: Secondary | ICD-10-CM | POA: Diagnosis not present

## 2021-10-05 DIAGNOSIS — I6330 Cerebral infarction due to thrombosis of unspecified cerebral artery: Secondary | ICD-10-CM | POA: Diagnosis not present

## 2021-10-05 DIAGNOSIS — I69928 Other speech and language deficits following unspecified cerebrovascular disease: Secondary | ICD-10-CM | POA: Diagnosis not present

## 2021-10-05 DIAGNOSIS — I69054 Hemiplegia and hemiparesis following nontraumatic subarachnoid hemorrhage affecting left non-dominant side: Secondary | ICD-10-CM | POA: Diagnosis not present

## 2021-10-06 DIAGNOSIS — M25512 Pain in left shoulder: Secondary | ICD-10-CM | POA: Diagnosis not present

## 2021-10-06 DIAGNOSIS — I69928 Other speech and language deficits following unspecified cerebrovascular disease: Secondary | ICD-10-CM | POA: Diagnosis not present

## 2021-10-06 DIAGNOSIS — R293 Abnormal posture: Secondary | ICD-10-CM | POA: Diagnosis not present

## 2021-10-06 DIAGNOSIS — M6281 Muscle weakness (generalized): Secondary | ICD-10-CM | POA: Diagnosis not present

## 2021-10-06 DIAGNOSIS — M24542 Contracture, left hand: Secondary | ICD-10-CM | POA: Diagnosis not present

## 2021-10-06 DIAGNOSIS — I69354 Hemiplegia and hemiparesis following cerebral infarction affecting left non-dominant side: Secondary | ICD-10-CM | POA: Diagnosis not present

## 2021-10-06 DIAGNOSIS — I6330 Cerebral infarction due to thrombosis of unspecified cerebral artery: Secondary | ICD-10-CM | POA: Diagnosis not present

## 2021-10-06 DIAGNOSIS — R278 Other lack of coordination: Secondary | ICD-10-CM | POA: Diagnosis not present

## 2021-10-06 DIAGNOSIS — I69054 Hemiplegia and hemiparesis following nontraumatic subarachnoid hemorrhage affecting left non-dominant side: Secondary | ICD-10-CM | POA: Diagnosis not present

## 2021-10-06 DIAGNOSIS — R1312 Dysphagia, oropharyngeal phase: Secondary | ICD-10-CM | POA: Diagnosis not present

## 2021-10-07 DIAGNOSIS — I6330 Cerebral infarction due to thrombosis of unspecified cerebral artery: Secondary | ICD-10-CM | POA: Diagnosis not present

## 2021-10-07 DIAGNOSIS — I69354 Hemiplegia and hemiparesis following cerebral infarction affecting left non-dominant side: Secondary | ICD-10-CM | POA: Diagnosis not present

## 2021-10-07 DIAGNOSIS — I69928 Other speech and language deficits following unspecified cerebrovascular disease: Secondary | ICD-10-CM | POA: Diagnosis not present

## 2021-10-07 DIAGNOSIS — R293 Abnormal posture: Secondary | ICD-10-CM | POA: Diagnosis not present

## 2021-10-07 DIAGNOSIS — R1312 Dysphagia, oropharyngeal phase: Secondary | ICD-10-CM | POA: Diagnosis not present

## 2021-10-07 DIAGNOSIS — I69054 Hemiplegia and hemiparesis following nontraumatic subarachnoid hemorrhage affecting left non-dominant side: Secondary | ICD-10-CM | POA: Diagnosis not present

## 2021-10-07 DIAGNOSIS — M24542 Contracture, left hand: Secondary | ICD-10-CM | POA: Diagnosis not present

## 2021-10-07 DIAGNOSIS — M6281 Muscle weakness (generalized): Secondary | ICD-10-CM | POA: Diagnosis not present

## 2021-10-07 DIAGNOSIS — R278 Other lack of coordination: Secondary | ICD-10-CM | POA: Diagnosis not present

## 2021-10-07 DIAGNOSIS — M25512 Pain in left shoulder: Secondary | ICD-10-CM | POA: Diagnosis not present

## 2021-10-08 DIAGNOSIS — I69054 Hemiplegia and hemiparesis following nontraumatic subarachnoid hemorrhage affecting left non-dominant side: Secondary | ICD-10-CM | POA: Diagnosis not present

## 2021-10-08 DIAGNOSIS — R1312 Dysphagia, oropharyngeal phase: Secondary | ICD-10-CM | POA: Diagnosis not present

## 2021-10-08 DIAGNOSIS — R293 Abnormal posture: Secondary | ICD-10-CM | POA: Diagnosis not present

## 2021-10-08 DIAGNOSIS — M24542 Contracture, left hand: Secondary | ICD-10-CM | POA: Diagnosis not present

## 2021-10-08 DIAGNOSIS — I69354 Hemiplegia and hemiparesis following cerebral infarction affecting left non-dominant side: Secondary | ICD-10-CM | POA: Diagnosis not present

## 2021-10-08 DIAGNOSIS — M6281 Muscle weakness (generalized): Secondary | ICD-10-CM | POA: Diagnosis not present

## 2021-10-08 DIAGNOSIS — I69928 Other speech and language deficits following unspecified cerebrovascular disease: Secondary | ICD-10-CM | POA: Diagnosis not present

## 2021-10-08 DIAGNOSIS — M25512 Pain in left shoulder: Secondary | ICD-10-CM | POA: Diagnosis not present

## 2021-10-08 DIAGNOSIS — R278 Other lack of coordination: Secondary | ICD-10-CM | POA: Diagnosis not present

## 2021-10-08 DIAGNOSIS — I6330 Cerebral infarction due to thrombosis of unspecified cerebral artery: Secondary | ICD-10-CM | POA: Diagnosis not present

## 2021-10-09 DIAGNOSIS — I6330 Cerebral infarction due to thrombosis of unspecified cerebral artery: Secondary | ICD-10-CM | POA: Diagnosis not present

## 2021-10-09 DIAGNOSIS — R293 Abnormal posture: Secondary | ICD-10-CM | POA: Diagnosis not present

## 2021-10-09 DIAGNOSIS — I69054 Hemiplegia and hemiparesis following nontraumatic subarachnoid hemorrhage affecting left non-dominant side: Secondary | ICD-10-CM | POA: Diagnosis not present

## 2021-10-09 DIAGNOSIS — M24542 Contracture, left hand: Secondary | ICD-10-CM | POA: Diagnosis not present

## 2021-10-09 DIAGNOSIS — M6281 Muscle weakness (generalized): Secondary | ICD-10-CM | POA: Diagnosis not present

## 2021-10-09 DIAGNOSIS — I69928 Other speech and language deficits following unspecified cerebrovascular disease: Secondary | ICD-10-CM | POA: Diagnosis not present

## 2021-10-09 DIAGNOSIS — I69354 Hemiplegia and hemiparesis following cerebral infarction affecting left non-dominant side: Secondary | ICD-10-CM | POA: Diagnosis not present

## 2021-10-09 DIAGNOSIS — M25512 Pain in left shoulder: Secondary | ICD-10-CM | POA: Diagnosis not present

## 2021-10-09 DIAGNOSIS — R278 Other lack of coordination: Secondary | ICD-10-CM | POA: Diagnosis not present

## 2021-10-09 DIAGNOSIS — R1312 Dysphagia, oropharyngeal phase: Secondary | ICD-10-CM | POA: Diagnosis not present

## 2021-10-10 DIAGNOSIS — M6281 Muscle weakness (generalized): Secondary | ICD-10-CM | POA: Diagnosis not present

## 2021-10-10 DIAGNOSIS — M25512 Pain in left shoulder: Secondary | ICD-10-CM | POA: Diagnosis not present

## 2021-10-10 DIAGNOSIS — R293 Abnormal posture: Secondary | ICD-10-CM | POA: Diagnosis not present

## 2021-10-10 DIAGNOSIS — M24542 Contracture, left hand: Secondary | ICD-10-CM | POA: Diagnosis not present

## 2021-10-10 DIAGNOSIS — I6330 Cerebral infarction due to thrombosis of unspecified cerebral artery: Secondary | ICD-10-CM | POA: Diagnosis not present

## 2021-10-10 DIAGNOSIS — R1312 Dysphagia, oropharyngeal phase: Secondary | ICD-10-CM | POA: Diagnosis not present

## 2021-10-10 DIAGNOSIS — I69054 Hemiplegia and hemiparesis following nontraumatic subarachnoid hemorrhage affecting left non-dominant side: Secondary | ICD-10-CM | POA: Diagnosis not present

## 2021-10-10 DIAGNOSIS — I69928 Other speech and language deficits following unspecified cerebrovascular disease: Secondary | ICD-10-CM | POA: Diagnosis not present

## 2021-10-10 DIAGNOSIS — R278 Other lack of coordination: Secondary | ICD-10-CM | POA: Diagnosis not present

## 2021-10-10 DIAGNOSIS — I69354 Hemiplegia and hemiparesis following cerebral infarction affecting left non-dominant side: Secondary | ICD-10-CM | POA: Diagnosis not present

## 2021-10-11 DIAGNOSIS — M24542 Contracture, left hand: Secondary | ICD-10-CM | POA: Diagnosis not present

## 2021-10-11 DIAGNOSIS — M25512 Pain in left shoulder: Secondary | ICD-10-CM | POA: Diagnosis not present

## 2021-10-11 DIAGNOSIS — I69354 Hemiplegia and hemiparesis following cerebral infarction affecting left non-dominant side: Secondary | ICD-10-CM | POA: Diagnosis not present

## 2021-10-11 DIAGNOSIS — M6281 Muscle weakness (generalized): Secondary | ICD-10-CM | POA: Diagnosis not present

## 2021-10-11 DIAGNOSIS — I69054 Hemiplegia and hemiparesis following nontraumatic subarachnoid hemorrhage affecting left non-dominant side: Secondary | ICD-10-CM | POA: Diagnosis not present

## 2021-10-11 DIAGNOSIS — R1312 Dysphagia, oropharyngeal phase: Secondary | ICD-10-CM | POA: Diagnosis not present

## 2021-10-11 DIAGNOSIS — I69928 Other speech and language deficits following unspecified cerebrovascular disease: Secondary | ICD-10-CM | POA: Diagnosis not present

## 2021-10-11 DIAGNOSIS — R278 Other lack of coordination: Secondary | ICD-10-CM | POA: Diagnosis not present

## 2021-10-11 DIAGNOSIS — I6330 Cerebral infarction due to thrombosis of unspecified cerebral artery: Secondary | ICD-10-CM | POA: Diagnosis not present

## 2021-10-11 DIAGNOSIS — R293 Abnormal posture: Secondary | ICD-10-CM | POA: Diagnosis not present

## 2021-10-13 DIAGNOSIS — M24542 Contracture, left hand: Secondary | ICD-10-CM | POA: Diagnosis not present

## 2021-10-13 DIAGNOSIS — I69354 Hemiplegia and hemiparesis following cerebral infarction affecting left non-dominant side: Secondary | ICD-10-CM | POA: Diagnosis not present

## 2021-10-13 DIAGNOSIS — R1312 Dysphagia, oropharyngeal phase: Secondary | ICD-10-CM | POA: Diagnosis not present

## 2021-10-13 DIAGNOSIS — I69928 Other speech and language deficits following unspecified cerebrovascular disease: Secondary | ICD-10-CM | POA: Diagnosis not present

## 2021-10-13 DIAGNOSIS — M25512 Pain in left shoulder: Secondary | ICD-10-CM | POA: Diagnosis not present

## 2021-10-13 DIAGNOSIS — R293 Abnormal posture: Secondary | ICD-10-CM | POA: Diagnosis not present

## 2021-10-13 DIAGNOSIS — M6281 Muscle weakness (generalized): Secondary | ICD-10-CM | POA: Diagnosis not present

## 2021-10-13 DIAGNOSIS — R278 Other lack of coordination: Secondary | ICD-10-CM | POA: Diagnosis not present

## 2021-10-13 DIAGNOSIS — I6330 Cerebral infarction due to thrombosis of unspecified cerebral artery: Secondary | ICD-10-CM | POA: Diagnosis not present

## 2021-10-13 DIAGNOSIS — I69054 Hemiplegia and hemiparesis following nontraumatic subarachnoid hemorrhage affecting left non-dominant side: Secondary | ICD-10-CM | POA: Diagnosis not present

## 2021-10-14 DIAGNOSIS — I69354 Hemiplegia and hemiparesis following cerebral infarction affecting left non-dominant side: Secondary | ICD-10-CM | POA: Diagnosis not present

## 2021-10-14 DIAGNOSIS — R293 Abnormal posture: Secondary | ICD-10-CM | POA: Diagnosis not present

## 2021-10-14 DIAGNOSIS — M25512 Pain in left shoulder: Secondary | ICD-10-CM | POA: Diagnosis not present

## 2021-10-14 DIAGNOSIS — I69928 Other speech and language deficits following unspecified cerebrovascular disease: Secondary | ICD-10-CM | POA: Diagnosis not present

## 2021-10-14 DIAGNOSIS — I69054 Hemiplegia and hemiparesis following nontraumatic subarachnoid hemorrhage affecting left non-dominant side: Secondary | ICD-10-CM | POA: Diagnosis not present

## 2021-10-14 DIAGNOSIS — M6281 Muscle weakness (generalized): Secondary | ICD-10-CM | POA: Diagnosis not present

## 2021-10-14 DIAGNOSIS — R1312 Dysphagia, oropharyngeal phase: Secondary | ICD-10-CM | POA: Diagnosis not present

## 2021-10-14 DIAGNOSIS — R278 Other lack of coordination: Secondary | ICD-10-CM | POA: Diagnosis not present

## 2021-10-14 DIAGNOSIS — I6330 Cerebral infarction due to thrombosis of unspecified cerebral artery: Secondary | ICD-10-CM | POA: Diagnosis not present

## 2021-10-14 DIAGNOSIS — M24542 Contracture, left hand: Secondary | ICD-10-CM | POA: Diagnosis not present

## 2021-10-15 DIAGNOSIS — M24542 Contracture, left hand: Secondary | ICD-10-CM | POA: Diagnosis not present

## 2021-10-15 DIAGNOSIS — I69054 Hemiplegia and hemiparesis following nontraumatic subarachnoid hemorrhage affecting left non-dominant side: Secondary | ICD-10-CM | POA: Diagnosis not present

## 2021-10-15 DIAGNOSIS — M6281 Muscle weakness (generalized): Secondary | ICD-10-CM | POA: Diagnosis not present

## 2021-10-15 DIAGNOSIS — R1312 Dysphagia, oropharyngeal phase: Secondary | ICD-10-CM | POA: Diagnosis not present

## 2021-10-15 DIAGNOSIS — R278 Other lack of coordination: Secondary | ICD-10-CM | POA: Diagnosis not present

## 2021-10-15 DIAGNOSIS — M25512 Pain in left shoulder: Secondary | ICD-10-CM | POA: Diagnosis not present

## 2021-10-15 DIAGNOSIS — I69928 Other speech and language deficits following unspecified cerebrovascular disease: Secondary | ICD-10-CM | POA: Diagnosis not present

## 2021-10-15 DIAGNOSIS — I6330 Cerebral infarction due to thrombosis of unspecified cerebral artery: Secondary | ICD-10-CM | POA: Diagnosis not present

## 2021-10-15 DIAGNOSIS — R293 Abnormal posture: Secondary | ICD-10-CM | POA: Diagnosis not present

## 2021-10-15 DIAGNOSIS — I69354 Hemiplegia and hemiparesis following cerebral infarction affecting left non-dominant side: Secondary | ICD-10-CM | POA: Diagnosis not present

## 2021-10-16 DIAGNOSIS — R293 Abnormal posture: Secondary | ICD-10-CM | POA: Diagnosis not present

## 2021-10-16 DIAGNOSIS — I6330 Cerebral infarction due to thrombosis of unspecified cerebral artery: Secondary | ICD-10-CM | POA: Diagnosis not present

## 2021-10-16 DIAGNOSIS — I69054 Hemiplegia and hemiparesis following nontraumatic subarachnoid hemorrhage affecting left non-dominant side: Secondary | ICD-10-CM | POA: Diagnosis not present

## 2021-10-16 DIAGNOSIS — R278 Other lack of coordination: Secondary | ICD-10-CM | POA: Diagnosis not present

## 2021-10-16 DIAGNOSIS — M24542 Contracture, left hand: Secondary | ICD-10-CM | POA: Diagnosis not present

## 2021-10-16 DIAGNOSIS — M6281 Muscle weakness (generalized): Secondary | ICD-10-CM | POA: Diagnosis not present

## 2021-10-16 DIAGNOSIS — M25512 Pain in left shoulder: Secondary | ICD-10-CM | POA: Diagnosis not present

## 2021-10-16 DIAGNOSIS — I69354 Hemiplegia and hemiparesis following cerebral infarction affecting left non-dominant side: Secondary | ICD-10-CM | POA: Diagnosis not present

## 2021-10-16 DIAGNOSIS — I69928 Other speech and language deficits following unspecified cerebrovascular disease: Secondary | ICD-10-CM | POA: Diagnosis not present

## 2021-10-16 DIAGNOSIS — R1312 Dysphagia, oropharyngeal phase: Secondary | ICD-10-CM | POA: Diagnosis not present

## 2021-10-17 DIAGNOSIS — I69928 Other speech and language deficits following unspecified cerebrovascular disease: Secondary | ICD-10-CM | POA: Diagnosis not present

## 2021-10-17 DIAGNOSIS — I6330 Cerebral infarction due to thrombosis of unspecified cerebral artery: Secondary | ICD-10-CM | POA: Diagnosis not present

## 2021-10-17 DIAGNOSIS — M25512 Pain in left shoulder: Secondary | ICD-10-CM | POA: Diagnosis not present

## 2021-10-17 DIAGNOSIS — M24542 Contracture, left hand: Secondary | ICD-10-CM | POA: Diagnosis not present

## 2021-10-17 DIAGNOSIS — I69354 Hemiplegia and hemiparesis following cerebral infarction affecting left non-dominant side: Secondary | ICD-10-CM | POA: Diagnosis not present

## 2021-10-17 DIAGNOSIS — I69054 Hemiplegia and hemiparesis following nontraumatic subarachnoid hemorrhage affecting left non-dominant side: Secondary | ICD-10-CM | POA: Diagnosis not present

## 2021-10-17 DIAGNOSIS — M6281 Muscle weakness (generalized): Secondary | ICD-10-CM | POA: Diagnosis not present

## 2021-10-17 DIAGNOSIS — R278 Other lack of coordination: Secondary | ICD-10-CM | POA: Diagnosis not present

## 2021-10-17 DIAGNOSIS — R1312 Dysphagia, oropharyngeal phase: Secondary | ICD-10-CM | POA: Diagnosis not present

## 2021-10-17 DIAGNOSIS — R293 Abnormal posture: Secondary | ICD-10-CM | POA: Diagnosis not present

## 2021-10-19 DIAGNOSIS — I480 Paroxysmal atrial fibrillation: Secondary | ICD-10-CM | POA: Diagnosis not present

## 2021-10-19 DIAGNOSIS — I639 Cerebral infarction, unspecified: Secondary | ICD-10-CM | POA: Diagnosis not present

## 2021-10-19 DIAGNOSIS — G8194 Hemiplegia, unspecified affecting left nondominant side: Secondary | ICD-10-CM | POA: Diagnosis not present

## 2021-10-19 DIAGNOSIS — I5032 Chronic diastolic (congestive) heart failure: Secondary | ICD-10-CM | POA: Diagnosis not present

## 2021-11-07 DIAGNOSIS — G479 Sleep disorder, unspecified: Secondary | ICD-10-CM | POA: Diagnosis not present

## 2021-11-07 DIAGNOSIS — I69991 Dysphagia following unspecified cerebrovascular disease: Secondary | ICD-10-CM | POA: Diagnosis not present

## 2021-11-07 DIAGNOSIS — J208 Acute bronchitis due to other specified organisms: Secondary | ICD-10-CM | POA: Diagnosis not present

## 2021-11-08 DIAGNOSIS — F0150 Vascular dementia without behavioral disturbance: Secondary | ICD-10-CM | POA: Diagnosis not present

## 2021-11-08 DIAGNOSIS — R059 Cough, unspecified: Secondary | ICD-10-CM | POA: Diagnosis not present

## 2021-11-08 DIAGNOSIS — I10 Essential (primary) hypertension: Secondary | ICD-10-CM | POA: Diagnosis not present

## 2021-11-09 DIAGNOSIS — R0902 Hypoxemia: Secondary | ICD-10-CM | POA: Diagnosis not present

## 2021-11-09 DIAGNOSIS — R6889 Other general symptoms and signs: Secondary | ICD-10-CM | POA: Diagnosis not present

## 2021-11-09 DIAGNOSIS — J208 Acute bronchitis due to other specified organisms: Secondary | ICD-10-CM | POA: Diagnosis not present

## 2021-11-13 DIAGNOSIS — J159 Unspecified bacterial pneumonia: Secondary | ICD-10-CM | POA: Diagnosis not present

## 2021-11-15 DIAGNOSIS — E1159 Type 2 diabetes mellitus with other circulatory complications: Secondary | ICD-10-CM | POA: Diagnosis not present

## 2021-11-15 DIAGNOSIS — I69054 Hemiplegia and hemiparesis following nontraumatic subarachnoid hemorrhage affecting left non-dominant side: Secondary | ICD-10-CM | POA: Diagnosis not present

## 2021-11-15 DIAGNOSIS — I69354 Hemiplegia and hemiparesis following cerebral infarction affecting left non-dominant side: Secondary | ICD-10-CM | POA: Diagnosis not present

## 2021-11-28 DIAGNOSIS — I69928 Other speech and language deficits following unspecified cerebrovascular disease: Secondary | ICD-10-CM | POA: Diagnosis not present

## 2021-11-28 DIAGNOSIS — I69991 Dysphagia following unspecified cerebrovascular disease: Secondary | ICD-10-CM | POA: Diagnosis not present

## 2021-11-28 DIAGNOSIS — F331 Major depressive disorder, recurrent, moderate: Secondary | ICD-10-CM | POA: Diagnosis not present

## 2021-11-28 DIAGNOSIS — I69054 Hemiplegia and hemiparesis following nontraumatic subarachnoid hemorrhage affecting left non-dominant side: Secondary | ICD-10-CM | POA: Diagnosis not present

## 2021-11-28 DIAGNOSIS — F0390 Unspecified dementia without behavioral disturbance: Secondary | ICD-10-CM | POA: Diagnosis not present

## 2021-11-28 DIAGNOSIS — F411 Generalized anxiety disorder: Secondary | ICD-10-CM | POA: Diagnosis not present

## 2021-11-28 DIAGNOSIS — R278 Other lack of coordination: Secondary | ICD-10-CM | POA: Diagnosis not present

## 2021-11-28 DIAGNOSIS — I6330 Cerebral infarction due to thrombosis of unspecified cerebral artery: Secondary | ICD-10-CM | POA: Diagnosis not present

## 2021-11-28 DIAGNOSIS — M24542 Contracture, left hand: Secondary | ICD-10-CM | POA: Diagnosis not present

## 2021-11-28 DIAGNOSIS — R1312 Dysphagia, oropharyngeal phase: Secondary | ICD-10-CM | POA: Diagnosis not present

## 2021-11-28 DIAGNOSIS — M6281 Muscle weakness (generalized): Secondary | ICD-10-CM | POA: Diagnosis not present

## 2021-11-28 DIAGNOSIS — M25512 Pain in left shoulder: Secondary | ICD-10-CM | POA: Diagnosis not present

## 2021-11-28 DIAGNOSIS — R293 Abnormal posture: Secondary | ICD-10-CM | POA: Diagnosis not present

## 2021-11-29 DIAGNOSIS — I6330 Cerebral infarction due to thrombosis of unspecified cerebral artery: Secondary | ICD-10-CM | POA: Diagnosis not present

## 2021-11-29 DIAGNOSIS — R278 Other lack of coordination: Secondary | ICD-10-CM | POA: Diagnosis not present

## 2021-11-29 DIAGNOSIS — M6281 Muscle weakness (generalized): Secondary | ICD-10-CM | POA: Diagnosis not present

## 2021-11-29 DIAGNOSIS — R1312 Dysphagia, oropharyngeal phase: Secondary | ICD-10-CM | POA: Diagnosis not present

## 2021-11-29 DIAGNOSIS — I69991 Dysphagia following unspecified cerebrovascular disease: Secondary | ICD-10-CM | POA: Diagnosis not present

## 2021-11-29 DIAGNOSIS — I69054 Hemiplegia and hemiparesis following nontraumatic subarachnoid hemorrhage affecting left non-dominant side: Secondary | ICD-10-CM | POA: Diagnosis not present

## 2021-11-29 DIAGNOSIS — M25512 Pain in left shoulder: Secondary | ICD-10-CM | POA: Diagnosis not present

## 2021-11-29 DIAGNOSIS — R293 Abnormal posture: Secondary | ICD-10-CM | POA: Diagnosis not present

## 2021-11-29 DIAGNOSIS — I69928 Other speech and language deficits following unspecified cerebrovascular disease: Secondary | ICD-10-CM | POA: Diagnosis not present

## 2021-11-29 DIAGNOSIS — M24542 Contracture, left hand: Secondary | ICD-10-CM | POA: Diagnosis not present

## 2021-11-30 DIAGNOSIS — R293 Abnormal posture: Secondary | ICD-10-CM | POA: Diagnosis not present

## 2021-11-30 DIAGNOSIS — I6330 Cerebral infarction due to thrombosis of unspecified cerebral artery: Secondary | ICD-10-CM | POA: Diagnosis not present

## 2021-11-30 DIAGNOSIS — M25512 Pain in left shoulder: Secondary | ICD-10-CM | POA: Diagnosis not present

## 2021-11-30 DIAGNOSIS — R1312 Dysphagia, oropharyngeal phase: Secondary | ICD-10-CM | POA: Diagnosis not present

## 2021-11-30 DIAGNOSIS — I69991 Dysphagia following unspecified cerebrovascular disease: Secondary | ICD-10-CM | POA: Diagnosis not present

## 2021-11-30 DIAGNOSIS — I69054 Hemiplegia and hemiparesis following nontraumatic subarachnoid hemorrhage affecting left non-dominant side: Secondary | ICD-10-CM | POA: Diagnosis not present

## 2021-11-30 DIAGNOSIS — M6281 Muscle weakness (generalized): Secondary | ICD-10-CM | POA: Diagnosis not present

## 2021-11-30 DIAGNOSIS — R278 Other lack of coordination: Secondary | ICD-10-CM | POA: Diagnosis not present

## 2021-11-30 DIAGNOSIS — M24542 Contracture, left hand: Secondary | ICD-10-CM | POA: Diagnosis not present

## 2021-11-30 DIAGNOSIS — I69928 Other speech and language deficits following unspecified cerebrovascular disease: Secondary | ICD-10-CM | POA: Diagnosis not present

## 2021-12-03 DIAGNOSIS — I69991 Dysphagia following unspecified cerebrovascular disease: Secondary | ICD-10-CM | POA: Diagnosis not present

## 2021-12-03 DIAGNOSIS — M25512 Pain in left shoulder: Secondary | ICD-10-CM | POA: Diagnosis not present

## 2021-12-03 DIAGNOSIS — I6330 Cerebral infarction due to thrombosis of unspecified cerebral artery: Secondary | ICD-10-CM | POA: Diagnosis not present

## 2021-12-03 DIAGNOSIS — M6281 Muscle weakness (generalized): Secondary | ICD-10-CM | POA: Diagnosis not present

## 2021-12-03 DIAGNOSIS — M24542 Contracture, left hand: Secondary | ICD-10-CM | POA: Diagnosis not present

## 2021-12-03 DIAGNOSIS — I69054 Hemiplegia and hemiparesis following nontraumatic subarachnoid hemorrhage affecting left non-dominant side: Secondary | ICD-10-CM | POA: Diagnosis not present

## 2021-12-03 DIAGNOSIS — R278 Other lack of coordination: Secondary | ICD-10-CM | POA: Diagnosis not present

## 2021-12-03 DIAGNOSIS — R293 Abnormal posture: Secondary | ICD-10-CM | POA: Diagnosis not present

## 2021-12-03 DIAGNOSIS — I69928 Other speech and language deficits following unspecified cerebrovascular disease: Secondary | ICD-10-CM | POA: Diagnosis not present

## 2021-12-03 DIAGNOSIS — R1312 Dysphagia, oropharyngeal phase: Secondary | ICD-10-CM | POA: Diagnosis not present

## 2021-12-04 DIAGNOSIS — I69991 Dysphagia following unspecified cerebrovascular disease: Secondary | ICD-10-CM | POA: Diagnosis not present

## 2021-12-04 DIAGNOSIS — M6281 Muscle weakness (generalized): Secondary | ICD-10-CM | POA: Diagnosis not present

## 2021-12-04 DIAGNOSIS — I69928 Other speech and language deficits following unspecified cerebrovascular disease: Secondary | ICD-10-CM | POA: Diagnosis not present

## 2021-12-04 DIAGNOSIS — M25512 Pain in left shoulder: Secondary | ICD-10-CM | POA: Diagnosis not present

## 2021-12-04 DIAGNOSIS — R1312 Dysphagia, oropharyngeal phase: Secondary | ICD-10-CM | POA: Diagnosis not present

## 2021-12-04 DIAGNOSIS — I6330 Cerebral infarction due to thrombosis of unspecified cerebral artery: Secondary | ICD-10-CM | POA: Diagnosis not present

## 2021-12-04 DIAGNOSIS — M24542 Contracture, left hand: Secondary | ICD-10-CM | POA: Diagnosis not present

## 2021-12-04 DIAGNOSIS — R293 Abnormal posture: Secondary | ICD-10-CM | POA: Diagnosis not present

## 2021-12-04 DIAGNOSIS — I69054 Hemiplegia and hemiparesis following nontraumatic subarachnoid hemorrhage affecting left non-dominant side: Secondary | ICD-10-CM | POA: Diagnosis not present

## 2021-12-04 DIAGNOSIS — R278 Other lack of coordination: Secondary | ICD-10-CM | POA: Diagnosis not present

## 2021-12-05 DIAGNOSIS — R1312 Dysphagia, oropharyngeal phase: Secondary | ICD-10-CM | POA: Diagnosis not present

## 2021-12-05 DIAGNOSIS — M6281 Muscle weakness (generalized): Secondary | ICD-10-CM | POA: Diagnosis not present

## 2021-12-05 DIAGNOSIS — I69928 Other speech and language deficits following unspecified cerebrovascular disease: Secondary | ICD-10-CM | POA: Diagnosis not present

## 2021-12-05 DIAGNOSIS — I6330 Cerebral infarction due to thrombosis of unspecified cerebral artery: Secondary | ICD-10-CM | POA: Diagnosis not present

## 2021-12-05 DIAGNOSIS — M25512 Pain in left shoulder: Secondary | ICD-10-CM | POA: Diagnosis not present

## 2021-12-05 DIAGNOSIS — I69054 Hemiplegia and hemiparesis following nontraumatic subarachnoid hemorrhage affecting left non-dominant side: Secondary | ICD-10-CM | POA: Diagnosis not present

## 2021-12-05 DIAGNOSIS — R278 Other lack of coordination: Secondary | ICD-10-CM | POA: Diagnosis not present

## 2021-12-05 DIAGNOSIS — M24542 Contracture, left hand: Secondary | ICD-10-CM | POA: Diagnosis not present

## 2021-12-05 DIAGNOSIS — R293 Abnormal posture: Secondary | ICD-10-CM | POA: Diagnosis not present

## 2021-12-05 DIAGNOSIS — I69991 Dysphagia following unspecified cerebrovascular disease: Secondary | ICD-10-CM | POA: Diagnosis not present

## 2021-12-06 DIAGNOSIS — M6281 Muscle weakness (generalized): Secondary | ICD-10-CM | POA: Diagnosis not present

## 2021-12-06 DIAGNOSIS — R1312 Dysphagia, oropharyngeal phase: Secondary | ICD-10-CM | POA: Diagnosis not present

## 2021-12-06 DIAGNOSIS — I6330 Cerebral infarction due to thrombosis of unspecified cerebral artery: Secondary | ICD-10-CM | POA: Diagnosis not present

## 2021-12-06 DIAGNOSIS — M24542 Contracture, left hand: Secondary | ICD-10-CM | POA: Diagnosis not present

## 2021-12-06 DIAGNOSIS — R278 Other lack of coordination: Secondary | ICD-10-CM | POA: Diagnosis not present

## 2021-12-06 DIAGNOSIS — M25512 Pain in left shoulder: Secondary | ICD-10-CM | POA: Diagnosis not present

## 2021-12-06 DIAGNOSIS — R293 Abnormal posture: Secondary | ICD-10-CM | POA: Diagnosis not present

## 2021-12-06 DIAGNOSIS — I69991 Dysphagia following unspecified cerebrovascular disease: Secondary | ICD-10-CM | POA: Diagnosis not present

## 2021-12-06 DIAGNOSIS — I69928 Other speech and language deficits following unspecified cerebrovascular disease: Secondary | ICD-10-CM | POA: Diagnosis not present

## 2021-12-06 DIAGNOSIS — I69054 Hemiplegia and hemiparesis following nontraumatic subarachnoid hemorrhage affecting left non-dominant side: Secondary | ICD-10-CM | POA: Diagnosis not present

## 2021-12-07 DIAGNOSIS — M24542 Contracture, left hand: Secondary | ICD-10-CM | POA: Diagnosis not present

## 2021-12-07 DIAGNOSIS — R293 Abnormal posture: Secondary | ICD-10-CM | POA: Diagnosis not present

## 2021-12-07 DIAGNOSIS — M6281 Muscle weakness (generalized): Secondary | ICD-10-CM | POA: Diagnosis not present

## 2021-12-07 DIAGNOSIS — I69928 Other speech and language deficits following unspecified cerebrovascular disease: Secondary | ICD-10-CM | POA: Diagnosis not present

## 2021-12-07 DIAGNOSIS — M25512 Pain in left shoulder: Secondary | ICD-10-CM | POA: Diagnosis not present

## 2021-12-07 DIAGNOSIS — I69991 Dysphagia following unspecified cerebrovascular disease: Secondary | ICD-10-CM | POA: Diagnosis not present

## 2021-12-07 DIAGNOSIS — R1312 Dysphagia, oropharyngeal phase: Secondary | ICD-10-CM | POA: Diagnosis not present

## 2021-12-07 DIAGNOSIS — I6330 Cerebral infarction due to thrombosis of unspecified cerebral artery: Secondary | ICD-10-CM | POA: Diagnosis not present

## 2021-12-07 DIAGNOSIS — R278 Other lack of coordination: Secondary | ICD-10-CM | POA: Diagnosis not present

## 2021-12-07 DIAGNOSIS — I69054 Hemiplegia and hemiparesis following nontraumatic subarachnoid hemorrhage affecting left non-dominant side: Secondary | ICD-10-CM | POA: Diagnosis not present

## 2021-12-08 DIAGNOSIS — R293 Abnormal posture: Secondary | ICD-10-CM | POA: Diagnosis not present

## 2021-12-08 DIAGNOSIS — I6330 Cerebral infarction due to thrombosis of unspecified cerebral artery: Secondary | ICD-10-CM | POA: Diagnosis not present

## 2021-12-08 DIAGNOSIS — I69928 Other speech and language deficits following unspecified cerebrovascular disease: Secondary | ICD-10-CM | POA: Diagnosis not present

## 2021-12-08 DIAGNOSIS — M6281 Muscle weakness (generalized): Secondary | ICD-10-CM | POA: Diagnosis not present

## 2021-12-08 DIAGNOSIS — I69991 Dysphagia following unspecified cerebrovascular disease: Secondary | ICD-10-CM | POA: Diagnosis not present

## 2021-12-08 DIAGNOSIS — M24542 Contracture, left hand: Secondary | ICD-10-CM | POA: Diagnosis not present

## 2021-12-08 DIAGNOSIS — M25512 Pain in left shoulder: Secondary | ICD-10-CM | POA: Diagnosis not present

## 2021-12-08 DIAGNOSIS — R278 Other lack of coordination: Secondary | ICD-10-CM | POA: Diagnosis not present

## 2021-12-08 DIAGNOSIS — I69054 Hemiplegia and hemiparesis following nontraumatic subarachnoid hemorrhage affecting left non-dominant side: Secondary | ICD-10-CM | POA: Diagnosis not present

## 2021-12-08 DIAGNOSIS — R1312 Dysphagia, oropharyngeal phase: Secondary | ICD-10-CM | POA: Diagnosis not present

## 2021-12-09 DIAGNOSIS — M24542 Contracture, left hand: Secondary | ICD-10-CM | POA: Diagnosis not present

## 2021-12-09 DIAGNOSIS — I69928 Other speech and language deficits following unspecified cerebrovascular disease: Secondary | ICD-10-CM | POA: Diagnosis not present

## 2021-12-09 DIAGNOSIS — R293 Abnormal posture: Secondary | ICD-10-CM | POA: Diagnosis not present

## 2021-12-09 DIAGNOSIS — R278 Other lack of coordination: Secondary | ICD-10-CM | POA: Diagnosis not present

## 2021-12-09 DIAGNOSIS — M6281 Muscle weakness (generalized): Secondary | ICD-10-CM | POA: Diagnosis not present

## 2021-12-09 DIAGNOSIS — I69991 Dysphagia following unspecified cerebrovascular disease: Secondary | ICD-10-CM | POA: Diagnosis not present

## 2021-12-09 DIAGNOSIS — R1312 Dysphagia, oropharyngeal phase: Secondary | ICD-10-CM | POA: Diagnosis not present

## 2021-12-09 DIAGNOSIS — I6330 Cerebral infarction due to thrombosis of unspecified cerebral artery: Secondary | ICD-10-CM | POA: Diagnosis not present

## 2021-12-09 DIAGNOSIS — M25512 Pain in left shoulder: Secondary | ICD-10-CM | POA: Diagnosis not present

## 2021-12-09 DIAGNOSIS — I69054 Hemiplegia and hemiparesis following nontraumatic subarachnoid hemorrhage affecting left non-dominant side: Secondary | ICD-10-CM | POA: Diagnosis not present

## 2021-12-10 DIAGNOSIS — I69991 Dysphagia following unspecified cerebrovascular disease: Secondary | ICD-10-CM | POA: Diagnosis not present

## 2021-12-10 DIAGNOSIS — R293 Abnormal posture: Secondary | ICD-10-CM | POA: Diagnosis not present

## 2021-12-10 DIAGNOSIS — I6330 Cerebral infarction due to thrombosis of unspecified cerebral artery: Secondary | ICD-10-CM | POA: Diagnosis not present

## 2021-12-10 DIAGNOSIS — I69054 Hemiplegia and hemiparesis following nontraumatic subarachnoid hemorrhage affecting left non-dominant side: Secondary | ICD-10-CM | POA: Diagnosis not present

## 2021-12-10 DIAGNOSIS — M6281 Muscle weakness (generalized): Secondary | ICD-10-CM | POA: Diagnosis not present

## 2021-12-10 DIAGNOSIS — M25512 Pain in left shoulder: Secondary | ICD-10-CM | POA: Diagnosis not present

## 2021-12-10 DIAGNOSIS — I69928 Other speech and language deficits following unspecified cerebrovascular disease: Secondary | ICD-10-CM | POA: Diagnosis not present

## 2021-12-10 DIAGNOSIS — M24542 Contracture, left hand: Secondary | ICD-10-CM | POA: Diagnosis not present

## 2021-12-10 DIAGNOSIS — R278 Other lack of coordination: Secondary | ICD-10-CM | POA: Diagnosis not present

## 2021-12-10 DIAGNOSIS — R1312 Dysphagia, oropharyngeal phase: Secondary | ICD-10-CM | POA: Diagnosis not present

## 2021-12-11 DIAGNOSIS — I69991 Dysphagia following unspecified cerebrovascular disease: Secondary | ICD-10-CM | POA: Diagnosis not present

## 2021-12-11 DIAGNOSIS — R293 Abnormal posture: Secondary | ICD-10-CM | POA: Diagnosis not present

## 2021-12-11 DIAGNOSIS — R1312 Dysphagia, oropharyngeal phase: Secondary | ICD-10-CM | POA: Diagnosis not present

## 2021-12-11 DIAGNOSIS — M6281 Muscle weakness (generalized): Secondary | ICD-10-CM | POA: Diagnosis not present

## 2021-12-11 DIAGNOSIS — M25512 Pain in left shoulder: Secondary | ICD-10-CM | POA: Diagnosis not present

## 2021-12-11 DIAGNOSIS — I69054 Hemiplegia and hemiparesis following nontraumatic subarachnoid hemorrhage affecting left non-dominant side: Secondary | ICD-10-CM | POA: Diagnosis not present

## 2021-12-11 DIAGNOSIS — I6330 Cerebral infarction due to thrombosis of unspecified cerebral artery: Secondary | ICD-10-CM | POA: Diagnosis not present

## 2021-12-11 DIAGNOSIS — M24542 Contracture, left hand: Secondary | ICD-10-CM | POA: Diagnosis not present

## 2021-12-11 DIAGNOSIS — I69928 Other speech and language deficits following unspecified cerebrovascular disease: Secondary | ICD-10-CM | POA: Diagnosis not present

## 2021-12-11 DIAGNOSIS — R278 Other lack of coordination: Secondary | ICD-10-CM | POA: Diagnosis not present

## 2021-12-12 DIAGNOSIS — I69928 Other speech and language deficits following unspecified cerebrovascular disease: Secondary | ICD-10-CM | POA: Diagnosis not present

## 2021-12-12 DIAGNOSIS — M25512 Pain in left shoulder: Secondary | ICD-10-CM | POA: Diagnosis not present

## 2021-12-12 DIAGNOSIS — M6281 Muscle weakness (generalized): Secondary | ICD-10-CM | POA: Diagnosis not present

## 2021-12-12 DIAGNOSIS — R278 Other lack of coordination: Secondary | ICD-10-CM | POA: Diagnosis not present

## 2021-12-12 DIAGNOSIS — I69054 Hemiplegia and hemiparesis following nontraumatic subarachnoid hemorrhage affecting left non-dominant side: Secondary | ICD-10-CM | POA: Diagnosis not present

## 2021-12-12 DIAGNOSIS — I69991 Dysphagia following unspecified cerebrovascular disease: Secondary | ICD-10-CM | POA: Diagnosis not present

## 2021-12-12 DIAGNOSIS — R1312 Dysphagia, oropharyngeal phase: Secondary | ICD-10-CM | POA: Diagnosis not present

## 2021-12-12 DIAGNOSIS — I6330 Cerebral infarction due to thrombosis of unspecified cerebral artery: Secondary | ICD-10-CM | POA: Diagnosis not present

## 2021-12-12 DIAGNOSIS — R293 Abnormal posture: Secondary | ICD-10-CM | POA: Diagnosis not present

## 2021-12-12 DIAGNOSIS — M24542 Contracture, left hand: Secondary | ICD-10-CM | POA: Diagnosis not present

## 2021-12-13 DIAGNOSIS — M6281 Muscle weakness (generalized): Secondary | ICD-10-CM | POA: Diagnosis not present

## 2021-12-13 DIAGNOSIS — R278 Other lack of coordination: Secondary | ICD-10-CM | POA: Diagnosis not present

## 2021-12-13 DIAGNOSIS — I69928 Other speech and language deficits following unspecified cerebrovascular disease: Secondary | ICD-10-CM | POA: Diagnosis not present

## 2021-12-13 DIAGNOSIS — M25512 Pain in left shoulder: Secondary | ICD-10-CM | POA: Diagnosis not present

## 2021-12-13 DIAGNOSIS — I69054 Hemiplegia and hemiparesis following nontraumatic subarachnoid hemorrhage affecting left non-dominant side: Secondary | ICD-10-CM | POA: Diagnosis not present

## 2021-12-13 DIAGNOSIS — R293 Abnormal posture: Secondary | ICD-10-CM | POA: Diagnosis not present

## 2021-12-13 DIAGNOSIS — R1312 Dysphagia, oropharyngeal phase: Secondary | ICD-10-CM | POA: Diagnosis not present

## 2021-12-13 DIAGNOSIS — I6330 Cerebral infarction due to thrombosis of unspecified cerebral artery: Secondary | ICD-10-CM | POA: Diagnosis not present

## 2021-12-13 DIAGNOSIS — M24542 Contracture, left hand: Secondary | ICD-10-CM | POA: Diagnosis not present

## 2021-12-13 DIAGNOSIS — I69991 Dysphagia following unspecified cerebrovascular disease: Secondary | ICD-10-CM | POA: Diagnosis not present

## 2021-12-14 DIAGNOSIS — I6330 Cerebral infarction due to thrombosis of unspecified cerebral artery: Secondary | ICD-10-CM | POA: Diagnosis not present

## 2021-12-14 DIAGNOSIS — M24542 Contracture, left hand: Secondary | ICD-10-CM | POA: Diagnosis not present

## 2021-12-14 DIAGNOSIS — R278 Other lack of coordination: Secondary | ICD-10-CM | POA: Diagnosis not present

## 2021-12-14 DIAGNOSIS — M6281 Muscle weakness (generalized): Secondary | ICD-10-CM | POA: Diagnosis not present

## 2021-12-14 DIAGNOSIS — M25512 Pain in left shoulder: Secondary | ICD-10-CM | POA: Diagnosis not present

## 2021-12-14 DIAGNOSIS — R1312 Dysphagia, oropharyngeal phase: Secondary | ICD-10-CM | POA: Diagnosis not present

## 2021-12-14 DIAGNOSIS — I69991 Dysphagia following unspecified cerebrovascular disease: Secondary | ICD-10-CM | POA: Diagnosis not present

## 2021-12-14 DIAGNOSIS — R293 Abnormal posture: Secondary | ICD-10-CM | POA: Diagnosis not present

## 2021-12-14 DIAGNOSIS — I69054 Hemiplegia and hemiparesis following nontraumatic subarachnoid hemorrhage affecting left non-dominant side: Secondary | ICD-10-CM | POA: Diagnosis not present

## 2021-12-14 DIAGNOSIS — I69928 Other speech and language deficits following unspecified cerebrovascular disease: Secondary | ICD-10-CM | POA: Diagnosis not present

## 2021-12-17 DIAGNOSIS — M24542 Contracture, left hand: Secondary | ICD-10-CM | POA: Diagnosis not present

## 2021-12-17 DIAGNOSIS — I69928 Other speech and language deficits following unspecified cerebrovascular disease: Secondary | ICD-10-CM | POA: Diagnosis not present

## 2021-12-17 DIAGNOSIS — M6281 Muscle weakness (generalized): Secondary | ICD-10-CM | POA: Diagnosis not present

## 2021-12-17 DIAGNOSIS — I69991 Dysphagia following unspecified cerebrovascular disease: Secondary | ICD-10-CM | POA: Diagnosis not present

## 2021-12-17 DIAGNOSIS — I69054 Hemiplegia and hemiparesis following nontraumatic subarachnoid hemorrhage affecting left non-dominant side: Secondary | ICD-10-CM | POA: Diagnosis not present

## 2021-12-17 DIAGNOSIS — M25512 Pain in left shoulder: Secondary | ICD-10-CM | POA: Diagnosis not present

## 2021-12-17 DIAGNOSIS — R278 Other lack of coordination: Secondary | ICD-10-CM | POA: Diagnosis not present

## 2021-12-17 DIAGNOSIS — R293 Abnormal posture: Secondary | ICD-10-CM | POA: Diagnosis not present

## 2021-12-17 DIAGNOSIS — I6330 Cerebral infarction due to thrombosis of unspecified cerebral artery: Secondary | ICD-10-CM | POA: Diagnosis not present

## 2021-12-17 DIAGNOSIS — R1312 Dysphagia, oropharyngeal phase: Secondary | ICD-10-CM | POA: Diagnosis not present

## 2021-12-18 DIAGNOSIS — I69928 Other speech and language deficits following unspecified cerebrovascular disease: Secondary | ICD-10-CM | POA: Diagnosis not present

## 2021-12-18 DIAGNOSIS — R278 Other lack of coordination: Secondary | ICD-10-CM | POA: Diagnosis not present

## 2021-12-18 DIAGNOSIS — I69991 Dysphagia following unspecified cerebrovascular disease: Secondary | ICD-10-CM | POA: Diagnosis not present

## 2021-12-18 DIAGNOSIS — I69054 Hemiplegia and hemiparesis following nontraumatic subarachnoid hemorrhage affecting left non-dominant side: Secondary | ICD-10-CM | POA: Diagnosis not present

## 2021-12-18 DIAGNOSIS — R1312 Dysphagia, oropharyngeal phase: Secondary | ICD-10-CM | POA: Diagnosis not present

## 2021-12-18 DIAGNOSIS — M24542 Contracture, left hand: Secondary | ICD-10-CM | POA: Diagnosis not present

## 2021-12-18 DIAGNOSIS — I6330 Cerebral infarction due to thrombosis of unspecified cerebral artery: Secondary | ICD-10-CM | POA: Diagnosis not present

## 2021-12-18 DIAGNOSIS — R293 Abnormal posture: Secondary | ICD-10-CM | POA: Diagnosis not present

## 2021-12-18 DIAGNOSIS — M6281 Muscle weakness (generalized): Secondary | ICD-10-CM | POA: Diagnosis not present

## 2021-12-18 DIAGNOSIS — M25512 Pain in left shoulder: Secondary | ICD-10-CM | POA: Diagnosis not present

## 2021-12-26 DIAGNOSIS — I10 Essential (primary) hypertension: Secondary | ICD-10-CM | POA: Diagnosis not present

## 2022-01-28 DIAGNOSIS — N390 Urinary tract infection, site not specified: Secondary | ICD-10-CM | POA: Diagnosis not present

## 2022-03-19 ENCOUNTER — Emergency Department (HOSPITAL_COMMUNITY): Payer: Medicare Other

## 2022-03-19 ENCOUNTER — Inpatient Hospital Stay (HOSPITAL_COMMUNITY)
Admission: EM | Admit: 2022-03-19 | Discharge: 2022-03-25 | DRG: 871 | Disposition: E | Payer: Medicare Other | Source: Skilled Nursing Facility | Attending: Pulmonary Disease | Admitting: Pulmonary Disease

## 2022-03-19 DIAGNOSIS — G894 Chronic pain syndrome: Secondary | ICD-10-CM | POA: Diagnosis present

## 2022-03-19 DIAGNOSIS — Z885 Allergy status to narcotic agent status: Secondary | ICD-10-CM

## 2022-03-19 DIAGNOSIS — Z833 Family history of diabetes mellitus: Secondary | ICD-10-CM

## 2022-03-19 DIAGNOSIS — Z79890 Hormone replacement therapy (postmenopausal): Secondary | ICD-10-CM

## 2022-03-19 DIAGNOSIS — J9621 Acute and chronic respiratory failure with hypoxia: Secondary | ICD-10-CM

## 2022-03-19 DIAGNOSIS — N179 Acute kidney failure, unspecified: Secondary | ICD-10-CM | POA: Diagnosis present

## 2022-03-19 DIAGNOSIS — I2510 Atherosclerotic heart disease of native coronary artery without angina pectoris: Secondary | ICD-10-CM | POA: Diagnosis present

## 2022-03-19 DIAGNOSIS — R7989 Other specified abnormal findings of blood chemistry: Secondary | ICD-10-CM | POA: Diagnosis present

## 2022-03-19 DIAGNOSIS — G9340 Encephalopathy, unspecified: Secondary | ICD-10-CM

## 2022-03-19 DIAGNOSIS — K559 Vascular disorder of intestine, unspecified: Secondary | ICD-10-CM | POA: Diagnosis present

## 2022-03-19 DIAGNOSIS — E119 Type 2 diabetes mellitus without complications: Secondary | ICD-10-CM | POA: Diagnosis present

## 2022-03-19 DIAGNOSIS — Z8249 Family history of ischemic heart disease and other diseases of the circulatory system: Secondary | ICD-10-CM

## 2022-03-19 DIAGNOSIS — Z8669 Personal history of other diseases of the nervous system and sense organs: Secondary | ICD-10-CM

## 2022-03-19 DIAGNOSIS — Z66 Do not resuscitate: Secondary | ICD-10-CM | POA: Diagnosis not present

## 2022-03-19 DIAGNOSIS — Z79899 Other long term (current) drug therapy: Secondary | ICD-10-CM

## 2022-03-19 DIAGNOSIS — Z7982 Long term (current) use of aspirin: Secondary | ICD-10-CM

## 2022-03-19 DIAGNOSIS — R778 Other specified abnormalities of plasma proteins: Secondary | ICD-10-CM | POA: Diagnosis present

## 2022-03-19 DIAGNOSIS — K5641 Fecal impaction: Secondary | ICD-10-CM | POA: Diagnosis present

## 2022-03-19 DIAGNOSIS — F419 Anxiety disorder, unspecified: Secondary | ICD-10-CM | POA: Diagnosis present

## 2022-03-19 DIAGNOSIS — R6521 Severe sepsis with septic shock: Secondary | ICD-10-CM | POA: Diagnosis present

## 2022-03-19 DIAGNOSIS — A419 Sepsis, unspecified organism: Secondary | ICD-10-CM | POA: Diagnosis not present

## 2022-03-19 DIAGNOSIS — K7200 Acute and subacute hepatic failure without coma: Secondary | ICD-10-CM | POA: Diagnosis present

## 2022-03-19 DIAGNOSIS — I5021 Acute systolic (congestive) heart failure: Secondary | ICD-10-CM | POA: Diagnosis present

## 2022-03-19 DIAGNOSIS — F32A Depression, unspecified: Secondary | ICD-10-CM | POA: Diagnosis present

## 2022-03-19 DIAGNOSIS — Z955 Presence of coronary angioplasty implant and graft: Secondary | ICD-10-CM

## 2022-03-19 DIAGNOSIS — G9341 Metabolic encephalopathy: Secondary | ICD-10-CM | POA: Diagnosis present

## 2022-03-19 DIAGNOSIS — I69354 Hemiplegia and hemiparesis following cerebral infarction affecting left non-dominant side: Secondary | ICD-10-CM

## 2022-03-19 DIAGNOSIS — Z6830 Body mass index (BMI) 30.0-30.9, adult: Secondary | ICD-10-CM

## 2022-03-19 DIAGNOSIS — Z7901 Long term (current) use of anticoagulants: Secondary | ICD-10-CM

## 2022-03-19 DIAGNOSIS — I272 Pulmonary hypertension, unspecified: Secondary | ICD-10-CM | POA: Diagnosis present

## 2022-03-19 DIAGNOSIS — E039 Hypothyroidism, unspecified: Secondary | ICD-10-CM | POA: Diagnosis present

## 2022-03-19 DIAGNOSIS — Z8701 Personal history of pneumonia (recurrent): Secondary | ICD-10-CM

## 2022-03-19 DIAGNOSIS — E785 Hyperlipidemia, unspecified: Secondary | ICD-10-CM | POA: Diagnosis present

## 2022-03-19 DIAGNOSIS — Z515 Encounter for palliative care: Secondary | ICD-10-CM

## 2022-03-19 DIAGNOSIS — Z881 Allergy status to other antibiotic agents status: Secondary | ICD-10-CM

## 2022-03-19 DIAGNOSIS — E875 Hyperkalemia: Secondary | ICD-10-CM | POA: Diagnosis present

## 2022-03-19 DIAGNOSIS — R579 Shock, unspecified: Secondary | ICD-10-CM | POA: Diagnosis not present

## 2022-03-19 DIAGNOSIS — I4891 Unspecified atrial fibrillation: Secondary | ICD-10-CM

## 2022-03-19 DIAGNOSIS — R402431 Glasgow coma scale score 3-8, in the field [EMT or ambulance]: Secondary | ICD-10-CM

## 2022-03-19 DIAGNOSIS — Z905 Acquired absence of kidney: Secondary | ICD-10-CM

## 2022-03-19 DIAGNOSIS — Z781 Physical restraint status: Secondary | ICD-10-CM

## 2022-03-19 DIAGNOSIS — Z794 Long term (current) use of insulin: Secondary | ICD-10-CM

## 2022-03-19 DIAGNOSIS — R1310 Dysphagia, unspecified: Secondary | ICD-10-CM | POA: Diagnosis present

## 2022-03-19 DIAGNOSIS — E669 Obesity, unspecified: Secondary | ICD-10-CM | POA: Diagnosis present

## 2022-03-19 DIAGNOSIS — Z888 Allergy status to other drugs, medicaments and biological substances status: Secondary | ICD-10-CM

## 2022-03-19 DIAGNOSIS — I110 Hypertensive heart disease with heart failure: Secondary | ICD-10-CM | POA: Diagnosis present

## 2022-03-19 LAB — CBC WITH DIFFERENTIAL/PLATELET
Abs Immature Granulocytes: 0.12 10*3/uL — ABNORMAL HIGH (ref 0.00–0.07)
Basophils Absolute: 0.1 10*3/uL (ref 0.0–0.1)
Basophils Relative: 0 %
Eosinophils Absolute: 0 10*3/uL (ref 0.0–0.5)
Eosinophils Relative: 0 %
HCT: 39.3 % (ref 36.0–46.0)
Hemoglobin: 10.9 g/dL — ABNORMAL LOW (ref 12.0–15.0)
Immature Granulocytes: 1 %
Lymphocytes Relative: 11 %
Lymphs Abs: 1.7 10*3/uL (ref 0.7–4.0)
MCH: 26.2 pg (ref 26.0–34.0)
MCHC: 27.7 g/dL — ABNORMAL LOW (ref 30.0–36.0)
MCV: 94.5 fL (ref 80.0–100.0)
Monocytes Absolute: 1.7 10*3/uL — ABNORMAL HIGH (ref 0.1–1.0)
Monocytes Relative: 11 %
Neutro Abs: 11.6 10*3/uL — ABNORMAL HIGH (ref 1.7–7.7)
Neutrophils Relative %: 77 %
Platelets: 263 10*3/uL (ref 150–400)
RBC: 4.16 MIL/uL (ref 3.87–5.11)
RDW: 18.1 % — ABNORMAL HIGH (ref 11.5–15.5)
WBC: 15.2 10*3/uL — ABNORMAL HIGH (ref 4.0–10.5)
nRBC: 0.7 % — ABNORMAL HIGH (ref 0.0–0.2)

## 2022-03-19 LAB — COMPREHENSIVE METABOLIC PANEL
ALT: 1086 U/L — ABNORMAL HIGH (ref 0–44)
AST: 2136 U/L — ABNORMAL HIGH (ref 15–41)
Albumin: 3.6 g/dL (ref 3.5–5.0)
Alkaline Phosphatase: 84 U/L (ref 38–126)
Anion gap: 17 — ABNORMAL HIGH (ref 5–15)
BUN: 21 mg/dL (ref 8–23)
CO2: 15 mmol/L — ABNORMAL LOW (ref 22–32)
Calcium: 9.7 mg/dL (ref 8.9–10.3)
Chloride: 111 mmol/L (ref 98–111)
Creatinine, Ser: 1.13 mg/dL — ABNORMAL HIGH (ref 0.44–1.00)
GFR, Estimated: 49 mL/min — ABNORMAL LOW (ref >60)
Glucose, Bld: 185 mg/dL — ABNORMAL HIGH (ref 70–99)
Potassium: 5.4 mmol/L — ABNORMAL HIGH (ref 3.5–5.1)
Sodium: 143 mmol/L (ref 135–145)
Total Bilirubin: 2.5 mg/dL — ABNORMAL HIGH (ref 0.3–1.2)
Total Protein: 6.9 g/dL (ref 6.5–8.1)

## 2022-03-19 LAB — I-STAT VENOUS BLOOD GAS, ED
Acid-base deficit: 8 mmol/L — ABNORMAL HIGH (ref 0.0–2.0)
Bicarbonate: 15.3 mmol/L — ABNORMAL LOW (ref 20.0–28.0)
Calcium, Ion: 1.03 mmol/L — ABNORMAL LOW (ref 1.15–1.40)
HCT: 38 % (ref 36.0–46.0)
Hemoglobin: 12.9 g/dL (ref 12.0–15.0)
O2 Saturation: 91 %
Potassium: 5.5 mmol/L — ABNORMAL HIGH (ref 3.5–5.1)
Sodium: 141 mmol/L (ref 135–145)
TCO2: 16 mmol/L — ABNORMAL LOW (ref 22–32)
pCO2, Ven: 26.1 mmHg — ABNORMAL LOW (ref 44–60)
pH, Ven: 7.375 (ref 7.25–7.43)
pO2, Ven: 61 mmHg — ABNORMAL HIGH (ref 32–45)

## 2022-03-19 LAB — APTT: aPTT: 34 seconds (ref 24–36)

## 2022-03-19 LAB — LACTIC ACID, PLASMA: Lactic Acid, Venous: 8.8 mmol/L (ref 0.5–1.9)

## 2022-03-19 LAB — TROPONIN I (HIGH SENSITIVITY): Troponin I (High Sensitivity): 54 ng/L — ABNORMAL HIGH (ref <18)

## 2022-03-19 LAB — PROTIME-INR
INR: 2.9 — ABNORMAL HIGH (ref 0.8–1.2)
Prothrombin Time: 29.8 seconds — ABNORMAL HIGH (ref 11.4–15.2)

## 2022-03-19 LAB — D-DIMER, QUANTITATIVE: D-Dimer, Quant: 11.4 ug/mL-FEU — ABNORMAL HIGH (ref 0.00–0.50)

## 2022-03-19 LAB — BRAIN NATRIURETIC PEPTIDE: B Natriuretic Peptide: 558 pg/mL — ABNORMAL HIGH (ref 0.0–100.0)

## 2022-03-19 MED ORDER — METOPROLOL TARTRATE 5 MG/5ML IV SOLN
5.0000 mg | Freq: Once | INTRAVENOUS | Status: AC
  Administered 2022-03-20: 5 mg via INTRAVENOUS
  Filled 2022-03-19: qty 5

## 2022-03-19 MED ORDER — ACETAMINOPHEN 325 MG PO TABS: 650.0000 mg | ORAL_TABLET | Freq: Once | ORAL | Status: DC

## 2022-03-19 MED ORDER — LORAZEPAM 2 MG/ML IJ SOLN: 1.0000 mg | Freq: Once | INTRAMUSCULAR | Status: DC

## 2022-03-19 MED ORDER — ACETAMINOPHEN 650 MG RE SUPP
650.0000 mg | Freq: Once | RECTAL | Status: AC
  Administered 2022-03-20: 650 mg via RECTAL
  Filled 2022-03-19: qty 1

## 2022-03-19 MED ORDER — METRONIDAZOLE 500 MG/100ML IV SOLN
500.0000 mg | Freq: Once | INTRAVENOUS | Status: AC
  Administered 2022-03-19: 500 mg via INTRAVENOUS
  Filled 2022-03-19: qty 100

## 2022-03-19 MED ORDER — FENTANYL CITRATE PF 50 MCG/ML IJ SOSY
50.0000 ug | PREFILLED_SYRINGE | Freq: Once | INTRAMUSCULAR | Status: AC
  Administered 2022-03-19: 50 ug via INTRAVENOUS
  Filled 2022-03-19: qty 1

## 2022-03-19 MED ORDER — IOHEXOL 350 MG/ML SOLN
100.0000 mL | Freq: Once | INTRAVENOUS | Status: AC | PRN
  Administered 2022-03-19: 100 mL via INTRAVENOUS

## 2022-03-19 MED ORDER — SODIUM CHLORIDE 0.9 % IV SOLN
2.0000 g | Freq: Once | INTRAVENOUS | Status: AC
  Administered 2022-03-19: 2 g via INTRAVENOUS
  Filled 2022-03-19: qty 12.5

## 2022-03-19 MED ORDER — LACTATED RINGERS IV SOLN: INTRAVENOUS | Status: DC

## 2022-03-19 MED ORDER — VANCOMYCIN HCL 1250 MG/250ML IV SOLN
1250.0000 mg | INTRAVENOUS | Status: DC
Start: 2022-03-21 — End: 2022-03-20

## 2022-03-19 MED ORDER — LACTATED RINGERS IV BOLUS
500.0000 mL | Freq: Once | INTRAVENOUS | Status: AC
  Administered 2022-03-19: 500 mL via INTRAVENOUS

## 2022-03-19 MED ORDER — LACTATED RINGERS IV BOLUS (SEPSIS)
500.0000 mL | Freq: Once | INTRAVENOUS | Status: AC
  Administered 2022-03-19: 500 mL via INTRAVENOUS

## 2022-03-19 MED ORDER — VANCOMYCIN HCL 1500 MG/300ML IV SOLN
1500.0000 mg | Freq: Once | INTRAVENOUS | Status: AC
Start: 2022-03-19 — End: 2022-03-20
  Administered 2022-03-19: 1500 mg via INTRAVENOUS
  Filled 2022-03-19: qty 300

## 2022-03-19 MED ORDER — VANCOMYCIN HCL IN DEXTROSE 1-5 GM/200ML-% IV SOLN: 1000.0000 mg | Freq: Once | INTRAVENOUS | Status: DC

## 2022-03-19 MED ORDER — NOREPINEPHRINE 4 MG/250ML-% IV SOLN
0.0000 ug/min | INTRAVENOUS | Status: DC
  Administered 2022-03-19: 2 ug/min via INTRAVENOUS
  Administered 2022-03-20: 40 ug/min via INTRAVENOUS
  Administered 2022-03-20: 20 ug/min via INTRAVENOUS
  Filled 2022-03-19 (×3): qty 250

## 2022-03-19 MED ORDER — CEFEPIME HCL 2 G IJ SOLR
2.0000 g | Freq: Two times a day (BID) | INTRAMUSCULAR | Status: DC
  Administered 2022-03-20: 2 g via INTRAVENOUS
  Filled 2022-03-19: qty 12.5

## 2022-03-19 NOTE — ED Notes (Signed)
Family updated as to patient's status, daughter. ?

## 2022-03-19 NOTE — Sepsis Progress Note (Signed)
Sepsis protocol is being followed by [facility]. 

## 2022-03-19 NOTE — ED Notes (Signed)
Patient transported to CT 

## 2022-03-19 NOTE — ED Triage Notes (Signed)
Pt arrived from [facility] by EMS. Pt recently treated for pneumonia and finished antibiotics for it. Per staff, pt at baseline is able to communicate; hx of previous stroke with speech deficits. Pt was c/o difficulty swallowing yesterday. Had a change in mental status today. On arrival, pt minimally responsive, labored breathing, pale, afib rvr rate 130-170. Extremities cool. 20g IV to R hand ?

## 2022-03-19 NOTE — Progress Notes (Signed)
Pharmacy Antibiotic Note ? ?[name] is a 81 y.o. female admitted on 03-27-2022 with  sepsis secondary to unknown source .  Pharmacy has been consulted for Cefepime and vancomycin dosing. ? ?SCr mildly elevated, WBC elevated  ? ?Plan: ?-Cefepime 2 gm IV Q 12 hours ?-Vancomycin 1500 mg IV load followed by Vancomycin 1250 mg IV Q 48 hrs. Goal AUC 400-550. ?Expected AUC: 513 ?SCr used: 1.13 ?-Monitor CBC, renal fx, cultures and clinical progress ?-Vancomycin levels as indicated  ? ? ?  ? ?Temp (24hrs), Avg:101.1 ?F (38.4 ?C), Min:101.1 ?F (38.4 ?C), Max:101.1 ?F (38.4 ?C) ? ?No results for input(s): WBC, CREATININE, LATICACIDVEN, VANCOTROUGH, VANCOPEAK, VANCORANDOM, GENTTROUGH, GENTPEAK, GENTRANDOM, TOBRATROUGH, TOBRAPEAK, TOBRARND, AMIKACINPEAK, AMIKACINTROU, AMIKACIN in the last 168 hours.  ?CrCl cannot be calculated (Patient's most recent lab result is older than the maximum 21 days allowed.).   ? ?Allergies  ?Allergen Reactions  ? Azithromycin Nausea And Vomiting  ? Cardizem [Diltiazem Hcl] Nausea And Vomiting  ? Codeine Other (See Comments)  ?  Made the patient "feel weird" ?  ? Coreg [Carvedilol] Nausea And Vomiting  ? Demerol [Meperidine] Other (See Comments)  ?  Disorientation ?  ? Erythromycin Nausea And Vomiting and Other (See Comments)  ?  Made the patient "very sick"  ? Inderal [Propranolol] Other (See Comments)  ?  "Made me cry"  ? Procardia [Nifedipine] Other (See Comments)  ?  "Disoriented and sick"  ? Wellbutrin [Bupropion] Nausea Only  ? Zoloft [Sertraline Hcl] Other (See Comments)  ?  Could not talk, made the patient stiff  ? ? ?Antimicrobials this admission: ?Cefepime 4/25 >>  ?Vancomycin 4/25 >>  ? ?Dose adjustments this admission: ? ? ?Microbiology results: ?4/25 BCx:  ?4/25 UCx:   ? ? ?Thank you for allowing pharmacy to be a part of this patient?s care. ? ?Vinnie Level, PharmD., BCCCP ?Clinical Pharmacist ?Please refer to AMION for unit-specific pharmacist  ? ? ?

## 2022-03-19 NOTE — ED Provider Notes (Signed)
?[facility] ?Provider Note ? ? ?CSN: [id number] ?Arrival date & time: 03/16/2022  2043 ? ?  ? ?History ? ?Chief Complaint  ?Patient presents with  ? Altered Mental Status  ? ? ?[name] is a 81 y.o. female. ? ? ?Altered Mental Status ?Patient presenting for altered mental status.  Her medical history includes anxiety, chronic pain, CAD, HTN, HLD, depression, obesity, narcotic addiction, DM, CVA, CHF, atrial fibrillation.  She was treated for pneumonia within the past month.  She currently resides in a rehab facility.  She shares a room with her husband.  EMS states that personnel on scene noted that she had some difficulty swallowing yesterday.  Today she had altered mental status with minimal responsiveness.  She is on oxygen at baseline, 2 to 3 L.  She was noted to have an irregularly irregular tachycardia during transit.  200 cc of IVF were given.   ? ?History per husband: Patient had fatigue yesterday and endorse some difficulty swallowing.  Today, she laid in bed all day.  She appeared to be sleeping.  When he went to wake her up, she was very confused.  At baseline, she has left hemiparesis and asymmetry of her face. ?  ? ?Home Medications ?Prior to Admission medications   ?Medication Sig Start Date End Date Taking? Authorizing Provider  ?ACCU-CHEK SOFTCLIX LANCETS lancets USE TO TEST FOUR TIMES DAILY ?Patient taking differently: 1 each by Other route See admin instructions. Use as directed to test blood sugar four times daily. 04/29/18   McVey, Gelene Mink, PA-C  ?aspirin EC 81 MG EC tablet Take 1 tablet (81 mg total) by mouth daily. 07/06/18   Hongalgi, Lenis Dickinson, MD  ?atorvastatin (LIPITOR) 40 MG tablet TAKE 1 TABLET BY MOUTH ONCE DAILY AT  6PM ?Patient taking differently: Take 40 mg by mouth daily.  06/18/19   Daleen Squibb, MD  ?blood glucose meter kit and supplies Dispense based on patient and insurance preference. Use up to four times daily as directed.  (FOR ICD-9 250.00, 250.01). ?Patient taking differently: 1 each by Other route See admin instructions. Use up to four times daily as directed. (FOR ICD-9 250.00, 250.01). 09/27/17   Oswald Hillock, MD  ?calcium carbonate (TUMS EX) 750 MG chewable tablet Chew 2 tablets by mouth daily.    [provider]  ?Cholecalciferol (VITAMIN D3) 5000 units TBDP Take 5,000 Units by mouth daily.     [provider]  ?divalproex (DEPAKOTE) 125 MG DR tablet Take 125 mg by mouth 2 (two) times daily. 06/22/20   [provider]  ?ELIQUIS 5 MG TABS tablet Take 1 tablet by mouth twice daily ?Patient taking differently: Take 5 mg by mouth 2 (two) times daily.  06/21/19   Croitoru, Mihai, MD  ?ferrous gluconate (FERGON) 324 MG tablet Take 324 mg by mouth every other day.    [provider]  ?fexofenadine (ALLEGRA) 180 MG tablet Take 180 mg by mouth daily as needed for allergies.     [provider]  ?gabapentin (NEURONTIN) 100 MG capsule Take 2 capsules (200 mg total) by mouth 3 (three) times daily. ?Patient taking differently: Take 300 mg by mouth 3 (three) times daily.  09/28/19   Barb Merino, MD  ?glucose blood (ACCU-CHEK AVIVA PLUS) test strip USE TO TEST FOUR TIMES DAILY ?Patient taking differently: 1 each by Other route See admin instructions. Use as directed to test four times daily. 12/02/17   McVey, Gelene Mink,  PA-C  ?HUMALOG 100 UNIT/ML injection Inject into the skin. 06/05/20   [provider]  ?Insulin Syringe-Needle U-100 (INSULIN SYRINGE .5CC/31GX5/16") 31G X 5/16" 0.5 ML MISC Use as directed ?Patient taking differently: 1 application by Other route See admin instructions. Use as directed 09/27/17   Oswald Hillock, MD  ?LANTUS SOLOSTAR 100 UNIT/ML Solostar Pen Inject 20 Units into the skin at bedtime. ?Patient taking differently: Inject 28 Units into the skin at bedtime.  09/28/19   Barb Merino, MD  ?levothyroxine (SYNTHROID) 50 MCG tablet TAKE 1 TABLET BY MOUTH ONCE  DAILY BEFORE BREAKFAST ?Patient taking differently: Take 50 mcg by mouth daily before breakfast.  08/25/19   Horald Pollen, MD  ?lidocaine (LIDODERM) 5 % Place 1 patch onto the skin daily. Remove & Discard patch within 12 hours or as directed by MD 09/28/19   Barb Merino, MD  ?lidocaine (LMX) 4 % cream Apply 1 application topically 3 (three) times daily as needed (pain).    [provider]  ?magnesium oxide (MAG-OX) 400 MG tablet Take 800 mg by mouth 2 (two) times daily.     [provider]  ?Menthol, Topical Analgesic, (BIOFREEZE) 4 % GEL Apply 1 application topically at bedtime.    [provider]  ?metoprolol tartrate (LOPRESSOR) 50 MG tablet Take 1 tablet by mouth twice daily ?Patient taking differently: Take 50 mg by mouth 2 (two) times daily.  07/11/19   Horald Pollen, MD  ?[place]. Devices (HUGO ROLLING WALKER ELITE) MISC 1 Units by Does not apply route daily as needed. ?Patient taking differently: 1 Units by Other route daily as needed (walking).  11/29/16   McVey, Gelene Mink, PA-C  ?oxyCODONE (OXY IR/ROXICODONE) 5 MG immediate release tablet Take 2.5 mg by mouth every 8 (eight) hours as needed for severe pain.    [provider]  ?potassium chloride (KLOR-CON) 10 MEQ tablet TAKE 1  BY MOUTH ONCE DAILY ?Patient taking differently: Take 10 mEq by mouth daily.  08/25/19   Horald Pollen, MD  ?venlafaxine XR (EFFEXOR XR) 150 MG 24 hr capsule Take 1 capsule (150 mg total) by mouth daily with breakfast. 09/30/18   McVey, Gelene Mink, PA-C  ?   ? ?Allergies    ?Azithromycin, Cardizem [diltiazem hcl], Codeine, Coreg [carvedilol], Demerol [meperidine], Erythromycin, Inderal [propranolol], Procardia [nifedipine], Wellbutrin [bupropion], and Zoloft [sertraline hcl]   ? ?Review of Systems   ?Review of Systems  ?Unable to perform ROS: Patient unresponsive  ? ?Physical Exam ?Updated Vital Signs ?BP (!) 149/72   Pulse (!) 39   Temp 99.7 ?F (37.6 ?C)    Resp (!) 22   Ht '5\' 3"'  (1.6 m)   Wt 78.7 kg   SpO2 94%   BMI 30.75 kg/m?  ?Physical Exam ?Vitals and nursing note reviewed.  ?Constitutional:   ?   Appearance: She is well-developed. She is obese. She is ill-appearing and diaphoretic.  ?HENT:  ?   Head: Normocephalic and atraumatic.  ?   Right Ear: External ear normal.  ?   Left Ear: External ear normal.  ?   Nose: Nose normal.  ?   Mouth/Throat:  ?   Mouth: Mucous membranes are moist.  ?Eyes:  ?   General: No scleral icterus. ?   Conjunctiva/sclera: Conjunctivae normal.  ?   Comments: Roving eye movements  ?Cardiovascular:  ?   Rate and Rhythm: Tachycardia present. Rhythm irregular.  ?   Heart sounds: No murmur heard. ?Pulmonary:  ?  Effort: Pulmonary effort is normal. No respiratory distress.  ?   Breath sounds: Normal breath sounds. No wheezing or rhonchi.  ?Abdominal:  ?   General: There is no distension.  ?   Palpations: Abdomen is soft.  ?   Tenderness: There is no abdominal tenderness.  ?Musculoskeletal:     ?   General: No swelling or deformity.  ?   Cervical back: Neck supple.  ?   Right lower leg: No edema.  ?   Left lower leg: No edema.  ?Skin: ?   General: Skin is cool.  ?   Capillary Refill: Capillary refill takes less than 2 seconds.  ?   Coloration: Skin is pale.  ?   Comments: 3 lidocaine patches, dated with marker of today's date, present on skin.  These were placed on bilateral shoulders and left knee.  ?Neurological:  ?   GCS: GCS eye subscore is 1. GCS verbal subscore is 1. GCS motor subscore is 5.  ?Psychiatric:     ?   Mood and Affect: Mood normal.  ? ? ?ED Results / Procedures / Treatments   ?Labs ?(all labs ordered are listed, but only abnormal results are displayed) ?Labs Reviewed  ?COMPREHENSIVE METABOLIC PANEL - Abnormal; Notable for the following components:  ?    Result Value  ? Potassium 5.4 (*)   ? CO2 15 (*)   ? Glucose, Bld 185 (*)   ? Creatinine, Ser 1.13 (*)   ? AST 2,136 (*)   ? ALT 1,086 (*)   ? Total Bilirubin 2.5 (*)   ?  GFR, Estimated 49 (*)   ? Anion gap 17 (*)   ? All other components within normal limits  ?LACTIC ACID, PLASMA - Abnormal; Notable for the following components:  ? Lactic Acid, Venous 8.8 (*)   ? All other compone

## 2022-03-20 ENCOUNTER — Inpatient Hospital Stay (HOSPITAL_COMMUNITY): Payer: Medicare Other

## 2022-03-20 ENCOUNTER — Emergency Department (HOSPITAL_COMMUNITY): Payer: Medicare Other

## 2022-03-20 DIAGNOSIS — K5641 Fecal impaction: Secondary | ICD-10-CM | POA: Diagnosis present

## 2022-03-20 DIAGNOSIS — M7989 Other specified soft tissue disorders: Secondary | ICD-10-CM | POA: Diagnosis not present

## 2022-03-20 DIAGNOSIS — K7200 Acute and subacute hepatic failure without coma: Secondary | ICD-10-CM | POA: Diagnosis present

## 2022-03-20 DIAGNOSIS — E875 Hyperkalemia: Secondary | ICD-10-CM | POA: Diagnosis present

## 2022-03-20 DIAGNOSIS — J9621 Acute and chronic respiratory failure with hypoxia: Secondary | ICD-10-CM

## 2022-03-20 DIAGNOSIS — Z6830 Body mass index (BMI) 30.0-30.9, adult: Secondary | ICD-10-CM | POA: Diagnosis not present

## 2022-03-20 DIAGNOSIS — G894 Chronic pain syndrome: Secondary | ICD-10-CM | POA: Diagnosis present

## 2022-03-20 DIAGNOSIS — I272 Pulmonary hypertension, unspecified: Secondary | ICD-10-CM | POA: Diagnosis present

## 2022-03-20 DIAGNOSIS — K559 Vascular disorder of intestine, unspecified: Secondary | ICD-10-CM | POA: Diagnosis present

## 2022-03-20 DIAGNOSIS — R579 Shock, unspecified: Secondary | ICD-10-CM | POA: Diagnosis present

## 2022-03-20 DIAGNOSIS — M79605 Pain in left leg: Secondary | ICD-10-CM

## 2022-03-20 DIAGNOSIS — F419 Anxiety disorder, unspecified: Secondary | ICD-10-CM | POA: Diagnosis present

## 2022-03-20 DIAGNOSIS — Z66 Do not resuscitate: Secondary | ICD-10-CM | POA: Diagnosis not present

## 2022-03-20 DIAGNOSIS — G9340 Encephalopathy, unspecified: Secondary | ICD-10-CM

## 2022-03-20 DIAGNOSIS — I4891 Unspecified atrial fibrillation: Secondary | ICD-10-CM | POA: Diagnosis present

## 2022-03-20 DIAGNOSIS — I69354 Hemiplegia and hemiparesis following cerebral infarction affecting left non-dominant side: Secondary | ICD-10-CM | POA: Diagnosis not present

## 2022-03-20 DIAGNOSIS — N179 Acute kidney failure, unspecified: Secondary | ICD-10-CM | POA: Diagnosis present

## 2022-03-20 DIAGNOSIS — E039 Hypothyroidism, unspecified: Secondary | ICD-10-CM | POA: Diagnosis present

## 2022-03-20 DIAGNOSIS — Z515 Encounter for palliative care: Secondary | ICD-10-CM | POA: Diagnosis not present

## 2022-03-20 DIAGNOSIS — A419 Sepsis, unspecified organism: Secondary | ICD-10-CM | POA: Diagnosis present

## 2022-03-20 DIAGNOSIS — F32A Depression, unspecified: Secondary | ICD-10-CM | POA: Diagnosis present

## 2022-03-20 DIAGNOSIS — E669 Obesity, unspecified: Secondary | ICD-10-CM | POA: Diagnosis present

## 2022-03-20 DIAGNOSIS — I5021 Acute systolic (congestive) heart failure: Secondary | ICD-10-CM | POA: Diagnosis present

## 2022-03-20 DIAGNOSIS — G9341 Metabolic encephalopathy: Secondary | ICD-10-CM | POA: Diagnosis present

## 2022-03-20 DIAGNOSIS — I2510 Atherosclerotic heart disease of native coronary artery without angina pectoris: Secondary | ICD-10-CM | POA: Diagnosis present

## 2022-03-20 DIAGNOSIS — I110 Hypertensive heart disease with heart failure: Secondary | ICD-10-CM | POA: Diagnosis present

## 2022-03-20 DIAGNOSIS — I5031 Acute diastolic (congestive) heart failure: Secondary | ICD-10-CM

## 2022-03-20 DIAGNOSIS — R6521 Severe sepsis with septic shock: Secondary | ICD-10-CM | POA: Diagnosis present

## 2022-03-20 DIAGNOSIS — E119 Type 2 diabetes mellitus without complications: Secondary | ICD-10-CM | POA: Diagnosis present

## 2022-03-20 LAB — BASIC METABOLIC PANEL
Anion gap: 24 — ABNORMAL HIGH (ref 5–15)
BUN: 24 mg/dL — ABNORMAL HIGH (ref 8–23)
BUN: 24 mg/dL — ABNORMAL HIGH (ref 8–23)
CO2: 7 mmol/L — ABNORMAL LOW (ref 22–32)
CO2: <7 mmol/L — ABNORMAL LOW (ref 22–32)
Calcium: 8.3 mg/dL — ABNORMAL LOW (ref 8.9–10.3)
Calcium: 8.2 mg/dL — ABNORMAL LOW (ref 8.9–10.3)
Chloride: 108 mmol/L (ref 98–111)
Chloride: 105 mmol/L (ref 98–111)
Creatinine, Ser: 1.38 mg/dL — ABNORMAL HIGH (ref 0.44–1.00)
Creatinine, Ser: 1.73 mg/dL — ABNORMAL HIGH (ref 0.44–1.00)
GFR, Estimated: 38 mL/min — ABNORMAL LOW (ref >60)
GFR, Estimated: 29 mL/min — ABNORMAL LOW (ref >60)
Glucose, Bld: 145 mg/dL — ABNORMAL HIGH (ref 70–99)
Glucose, Bld: 143 mg/dL — ABNORMAL HIGH (ref 70–99)
Potassium: 6.3 mmol/L (ref 3.5–5.1)
Potassium: 5.1 mmol/L (ref 3.5–5.1)
Sodium: 139 mmol/L (ref 135–145)
Sodium: 143 mmol/L (ref 135–145)

## 2022-03-20 LAB — I-STAT ARTERIAL BLOOD GAS, ED
Acid-base deficit: 14 mmol/L — ABNORMAL HIGH (ref 0.0–2.0)
Acid-base deficit: 20 mmol/L — ABNORMAL HIGH (ref 0.0–2.0)
Bicarbonate: 11.1 mmol/L — ABNORMAL LOW (ref 20.0–28.0)
Bicarbonate: 7.9 mmol/L — ABNORMAL LOW (ref 20.0–28.0)
Calcium, Ion: 1.11 mmol/L — ABNORMAL LOW (ref 1.15–1.40)
Calcium, Ion: 1.18 mmol/L (ref 1.15–1.40)
HCT: 31 % — ABNORMAL LOW (ref 36.0–46.0)
HCT: 35 % — ABNORMAL LOW (ref 36.0–46.0)
Hemoglobin: 10.5 g/dL — ABNORMAL LOW (ref 12.0–15.0)
Hemoglobin: 11.9 g/dL — ABNORMAL LOW (ref 12.0–15.0)
O2 Saturation: 100 %
O2 Saturation: 96 %
Patient temperature: 101
Patient temperature: 99.5
Potassium: 5.2 mmol/L — ABNORMAL HIGH (ref 3.5–5.1)
Potassium: 6 mmol/L — ABNORMAL HIGH (ref 3.5–5.1)
Sodium: 139 mmol/L (ref 135–145)
Sodium: 140 mmol/L (ref 135–145)
TCO2: 12 mmol/L — ABNORMAL LOW (ref 22–32)
TCO2: 9 mmol/L — ABNORMAL LOW (ref 22–32)
pCO2 arterial: 24.1 mmHg — ABNORMAL LOW (ref 32–48)
pCO2 arterial: 25.6 mmHg — ABNORMAL LOW (ref 32–48)
pH, Arterial: 7.102 — CL (ref 7.35–7.45)
pH, Arterial: 7.278 — ABNORMAL LOW (ref 7.35–7.45)
pO2, Arterial: 374 mmHg — ABNORMAL HIGH (ref 83–108)
pO2, Arterial: 99 mmHg (ref 83–108)

## 2022-03-20 LAB — RAPID URINE DRUG SCREEN, HOSP PERFORMED
Amphetamines: NOT DETECTED
Barbiturates: NOT DETECTED
Benzodiazepines: NOT DETECTED
Cocaine: NOT DETECTED
Opiates: NOT DETECTED
Tetrahydrocannabinol: NOT DETECTED

## 2022-03-20 LAB — URINALYSIS, ROUTINE W REFLEX MICROSCOPIC
Bacteria, UA: NONE SEEN
Bilirubin Urine: NEGATIVE
Glucose, UA: NEGATIVE mg/dL
Ketones, ur: NEGATIVE mg/dL
Leukocytes,Ua: NEGATIVE
Nitrite: NEGATIVE
Protein, ur: 100 mg/dL — AB
RBC / HPF: >50 RBC/hpf — ABNORMAL HIGH (ref 0–5)
Specific Gravity, Urine: 1.032 — ABNORMAL HIGH (ref 1.005–1.030)
pH: 5 (ref 5.0–8.0)

## 2022-03-20 LAB — LACTIC ACID, PLASMA
Lactic Acid, Venous: >9 mmol/L (ref 0.5–1.9)
Lactic Acid, Venous: >9 mmol/L (ref 0.5–1.9)
Lactic Acid, Venous: >9 mmol/L (ref 0.5–1.9)

## 2022-03-20 LAB — ECHOCARDIOGRAM COMPLETE
Height: 63 in
MV M vel: 4.05 m/s
MV Peak grad: 65.6 mmHg
Radius: 0.6 cm
S' Lateral: 3.4 cm
Weight: 2777.6 oz

## 2022-03-20 LAB — POCT I-STAT 7, (LYTES, BLD GAS, ICA,H+H)
Acid-base deficit: 24 mmol/L — ABNORMAL HIGH (ref 0.0–2.0)
Bicarbonate: 5.3 mmol/L — ABNORMAL LOW (ref 20.0–28.0)
Calcium, Ion: 1.06 mmol/L — ABNORMAL LOW (ref 1.15–1.40)
HCT: 34 % — ABNORMAL LOW (ref 36.0–46.0)
Hemoglobin: 11.6 g/dL — ABNORMAL LOW (ref 12.0–15.0)
O2 Saturation: 100 %
Patient temperature: 37.5
Potassium: 6.1 mmol/L — ABNORMAL HIGH (ref 3.5–5.1)
Sodium: 134 mmol/L — ABNORMAL LOW (ref 135–145)
TCO2: 6 mmol/L — ABNORMAL LOW (ref 22–32)
pCO2 arterial: 20.5 mmHg — ABNORMAL LOW (ref 32–48)
pH, Arterial: 7.026 — CL (ref 7.35–7.45)
pO2, Arterial: 422 mmHg — ABNORMAL HIGH (ref 83–108)

## 2022-03-20 LAB — HEPATITIS PANEL, ACUTE
HCV Ab: NONREACTIVE
Hep A IgM: NONREACTIVE
Hep B C IgM: NONREACTIVE
Hepatitis B Surface Ag: NONREACTIVE

## 2022-03-20 LAB — PROCALCITONIN: Procalcitonin: <0.1 ng/mL

## 2022-03-20 LAB — GLUCOSE, CAPILLARY
Glucose-Capillary: 111 mg/dL — ABNORMAL HIGH (ref 70–99)
Glucose-Capillary: 264 mg/dL — ABNORMAL HIGH (ref 70–99)
Glucose-Capillary: 99 mg/dL (ref 70–99)
Glucose-Capillary: 38 mg/dL — CL (ref 70–99)

## 2022-03-20 LAB — HEMOGLOBIN A1C
Hgb A1c MFr Bld: 6.4 % — ABNORMAL HIGH (ref 4.8–5.6)
Mean Plasma Glucose: 136.98 mg/dL

## 2022-03-20 LAB — ACETAMINOPHEN LEVEL: Acetaminophen (Tylenol), Serum: <10 ug/mL — ABNORMAL LOW (ref 10–30)

## 2022-03-20 LAB — TSH: TSH: 1.769 u[IU]/mL (ref 0.350–4.500)

## 2022-03-20 LAB — MRSA NEXT GEN BY PCR, NASAL: MRSA by PCR Next Gen: NOT DETECTED

## 2022-03-20 LAB — TROPONIN I (HIGH SENSITIVITY): Troponin I (High Sensitivity): 46 ng/L — ABNORMAL HIGH (ref <18)

## 2022-03-20 LAB — APTT: aPTT: 119 seconds — ABNORMAL HIGH (ref 24–36)

## 2022-03-20 LAB — HEPARIN LEVEL (UNFRACTIONATED): Heparin Unfractionated: >1.1 IU/mL — ABNORMAL HIGH (ref 0.30–0.70)

## 2022-03-20 LAB — STREP PNEUMONIAE URINARY ANTIGEN: Strep Pneumo Urinary Antigen: NEGATIVE

## 2022-03-20 LAB — CK: Total CK: 85 U/L (ref 38–234)

## 2022-03-20 LAB — LIPASE, BLOOD: Lipase: 39 U/L (ref 11–51)

## 2022-03-20 LAB — ETHANOL: Alcohol, Ethyl (B): <10 mg/dL (ref <10)

## 2022-03-20 MED ORDER — SODIUM BICARBONATE 8.4 % IV SOLN: 100.0000 meq | INTRAVENOUS | Status: AC

## 2022-03-20 MED ORDER — VANCOMYCIN HCL IN DEXTROSE 1-5 GM/200ML-% IV SOLN
1000.0000 mg | INTRAVENOUS | Status: DC
Start: 2022-03-21 — End: 2022-03-21

## 2022-03-20 MED ORDER — GLYCOPYRROLATE 1 MG PO TABS: 1.0000 mg | ORAL_TABLET | ORAL | Status: DC | PRN

## 2022-03-20 MED ORDER — SUCCINYLCHOLINE CHLORIDE 20 MG/ML IJ SOLN
INTRAMUSCULAR | Status: AC | PRN
  Administered 2022-03-20: 150 mg via INTRAVENOUS

## 2022-03-20 MED ORDER — ETOMIDATE 2 MG/ML IV SOLN
INTRAVENOUS | Status: AC | PRN
  Administered 2022-03-20: 20 mg via INTRAVENOUS

## 2022-03-20 MED ORDER — SODIUM CHLORIDE 0.9 % IV SOLN
0.5000 ug/min | INTRAVENOUS | Status: DC
  Administered 2022-03-20: 5 ug/min via INTRAVENOUS
  Filled 2022-03-20: qty 10

## 2022-03-20 MED ORDER — DOCUSATE SODIUM 50 MG/5ML PO LIQD: 100.0000 mg | Freq: Two times a day (BID) | ORAL | Status: DC | PRN

## 2022-03-20 MED ORDER — STERILE WATER FOR INJECTION IV SOLN
INTRAVENOUS | Status: DC
  Filled 2022-03-20 (×3): qty 1000

## 2022-03-20 MED ORDER — MORPHINE BOLUS VIA INFUSION
5.0000 mg | INTRAVENOUS | Status: DC | PRN
  Filled 2022-03-20: qty 5

## 2022-03-20 MED ORDER — DEXTROSE 50 % IV SOLN
1.0000 | Freq: Once | INTRAVENOUS | Status: AC
  Administered 2022-03-20: 50 mL via INTRAVENOUS
  Filled 2022-03-20: qty 50

## 2022-03-20 MED ORDER — SODIUM BICARBONATE 8.4 % IV SOLN
INTRAVENOUS | Status: AC
  Administered 2022-03-20: 50 meq via INTRAVENOUS
  Filled 2022-03-20: qty 100

## 2022-03-20 MED ORDER — FENTANYL 2500MCG IN NS 250ML (10MCG/ML) PREMIX INFUSION
INTRAVENOUS | Status: AC
  Administered 2022-03-20: 25 ug/h via INTRAVENOUS
  Filled 2022-03-20: qty 250

## 2022-03-20 MED ORDER — MORPHINE 100MG IN NS 100ML (1MG/ML) PREMIX INFUSION
0.0000 mg/h | INTRAVENOUS | Status: DC
  Administered 2022-03-20: 5 mg/h via INTRAVENOUS
  Filled 2022-03-20: qty 100

## 2022-03-20 MED ORDER — DEXTROSE 50 % IV SOLN
INTRAVENOUS | Status: AC
  Administered 2022-03-20: 25 g via INTRAVENOUS
  Filled 2022-03-20: qty 50

## 2022-03-20 MED ORDER — NALOXONE HCL 0.4 MG/ML IJ SOLN
0.4000 mg | Freq: Once | INTRAMUSCULAR | Status: AC
  Administered 2022-03-20: 0.4 mg via INTRAVENOUS
  Filled 2022-03-20: qty 1

## 2022-03-20 MED ORDER — SODIUM BICARBONATE 8.4 % IV SOLN
INTRAVENOUS | Status: AC
  Administered 2022-03-20: 100 meq via INTRAVENOUS
  Filled 2022-03-20: qty 100

## 2022-03-20 MED ORDER — VASOPRESSIN 20 UNITS/100 ML INFUSION FOR SHOCK
0.0000 [IU]/min | INTRAVENOUS | Status: DC
  Administered 2022-03-20: 0.04 [IU]/min via INTRAVENOUS
  Administered 2022-03-20: 0.03 [IU]/min via INTRAVENOUS
  Filled 2022-03-20 (×2): qty 100

## 2022-03-20 MED ORDER — SODIUM BICARBONATE 8.4 % IV SOLN
100.0000 meq | Freq: Once | INTRAVENOUS | Status: AC
  Administered 2022-03-20: 100 meq via INTRAVENOUS

## 2022-03-20 MED ORDER — SODIUM BICARBONATE 8.4 % IV SOLN
INTRAVENOUS | Status: AC
  Filled 2022-03-20: qty 50

## 2022-03-20 MED ORDER — ACETAMINOPHEN 650 MG RE SUPP: 650.0000 mg | Freq: Four times a day (QID) | RECTAL | Status: DC | PRN

## 2022-03-20 MED ORDER — MIDAZOLAM BOLUS VIA INFUSION (WITHDRAWAL LIFE SUSTAINING TX)
2.0000 mg | INTRAVENOUS | Status: DC | PRN
  Filled 2022-03-20: qty 2

## 2022-03-20 MED ORDER — AMIODARONE HCL IN DEXTROSE 360-4.14 MG/200ML-% IV SOLN
60.0000 mg/h | INTRAVENOUS | Status: AC
  Administered 2022-03-20 (×2): 60 mg/h via INTRAVENOUS
  Filled 2022-03-20: qty 200

## 2022-03-20 MED ORDER — MORPHINE SULFATE (PF) 2 MG/ML IV SOLN: 2.0000 mg | INTRAVENOUS | Status: DC | PRN

## 2022-03-20 MED ORDER — PERFLUTREN LIPID MICROSPHERE
1.0000 mL | INTRAVENOUS | Status: AC | PRN
  Administered 2022-03-20: 3 mL via INTRAVENOUS
  Filled 2022-03-20: qty 10

## 2022-03-20 MED ORDER — NOREPINEPHRINE 16 MG/250ML-% IV SOLN
0.0000 ug/min | INTRAVENOUS | Status: DC
  Administered 2022-03-20: 2 ug/min via INTRAVENOUS
  Administered 2022-03-20 (×2): 60 ug/min via INTRAVENOUS
  Filled 2022-03-20 (×3): qty 250

## 2022-03-20 MED ORDER — SODIUM BICARBONATE 8.4 % IV SOLN
INTRAVENOUS | Status: AC
  Administered 2022-03-20: 100 meq via INTRAVENOUS
  Filled 2022-03-20: qty 50

## 2022-03-20 MED ORDER — DEXTROSE 5 % IV SOLN: INTRAVENOUS | Status: DC

## 2022-03-20 MED ORDER — INSULIN ASPART 100 UNIT/ML IV SOLN
5.0000 [IU] | Freq: Once | INTRAVENOUS | Status: AC
  Administered 2022-03-20: 5 [IU] via INTRAVENOUS

## 2022-03-20 MED ORDER — SODIUM BICARBONATE 8.4 % IV SOLN: 50.0000 meq | Freq: Once | INTRAVENOUS | Status: AC

## 2022-03-20 MED ORDER — AMIODARONE LOAD VIA INFUSION
150.0000 mg | Freq: Once | INTRAVENOUS | Status: AC
  Administered 2022-03-20: 150 mg via INTRAVENOUS
  Filled 2022-03-20: qty 83.34

## 2022-03-20 MED ORDER — PANTOPRAZOLE SODIUM 40 MG IV SOLR: 40.0000 mg | Freq: Every day | INTRAVENOUS | Status: DC

## 2022-03-20 MED ORDER — HEPARIN (PORCINE) 25000 UT/250ML-% IV SOLN
750.0000 [IU]/h | INTRAVENOUS | Status: DC
  Administered 2022-03-20: 900 [IU]/h via INTRAVENOUS
  Filled 2022-03-20: qty 250

## 2022-03-20 MED ORDER — EPINEPHRINE HCL 5 MG/250ML IV SOLN IN NS
0.5000 ug/min | INTRAVENOUS | Status: DC
  Administered 2022-03-20: 0.5 ug/min via INTRAVENOUS
  Administered 2022-03-20: 20 ug/min via INTRAVENOUS
  Filled 2022-03-20 (×2): qty 250

## 2022-03-20 MED ORDER — SODIUM BICARBONATE 8.4 % IV SOLN
100.0000 meq | Freq: Once | INTRAVENOUS | Status: DC
Start: 2022-03-20 — End: 2022-03-20

## 2022-03-20 MED ORDER — ATORVASTATIN CALCIUM 40 MG PO TABS: 40.0000 mg | ORAL_TABLET | Freq: Every day | ORAL | Status: DC

## 2022-03-20 MED ORDER — PANTOPRAZOLE 2 MG/ML SUSPENSION
40.0000 mg | Freq: Every day | ORAL | Status: DC
  Administered 2022-03-20: 40 mg
  Filled 2022-03-20: qty 20

## 2022-03-20 MED ORDER — GLYCOPYRROLATE 0.2 MG/ML IJ SOLN: 0.2000 mg | INTRAMUSCULAR | Status: DC | PRN

## 2022-03-20 MED ORDER — POLYVINYL ALCOHOL 1.4 % OP SOLN
1.0000 [drp] | Freq: Four times a day (QID) | OPHTHALMIC | Status: DC | PRN
  Filled 2022-03-20: qty 15

## 2022-03-20 MED ORDER — ORAL CARE MOUTH RINSE
15.0000 mL | OROMUCOSAL | Status: DC
  Administered 2022-03-20 (×4): 15 mL via OROMUCOSAL

## 2022-03-20 MED ORDER — PROPOFOL 1000 MG/100ML IV EMUL
5.0000 ug/kg/min | INTRAVENOUS | Status: DC
  Administered 2022-03-20: 5 ug/kg/min via INTRAVENOUS
  Filled 2022-03-20: qty 100

## 2022-03-20 MED ORDER — MIDAZOLAM-SODIUM CHLORIDE 100-0.9 MG/100ML-% IV SOLN: 0.5000 mg/h | INTRAVENOUS | Status: DC

## 2022-03-20 MED ORDER — POLYETHYLENE GLYCOL 3350 17 G PO PACK: 17.0000 g | PACK | Freq: Every day | ORAL | Status: DC | PRN

## 2022-03-20 MED ORDER — INSULIN ASPART 100 UNIT/ML IJ SOLN
0.0000 [IU] | INTRAMUSCULAR | Status: DC
  Administered 2022-03-20: 5 [IU] via SUBCUTANEOUS

## 2022-03-20 MED ORDER — SODIUM CHLORIDE 0.9 % IV SOLN
INTRAVENOUS | Status: DC | PRN
Start: 2022-03-20 — End: 2022-03-21

## 2022-03-20 MED ORDER — LEVOTHYROXINE SODIUM 25 MCG PO TABS
50.0000 ug | ORAL_TABLET | Freq: Every day | ORAL | Status: DC
  Administered 2022-03-20: 50 ug via NASOGASTRIC
  Filled 2022-03-20: qty 2

## 2022-03-20 MED ORDER — CEFEPIME HCL 2 G IJ SOLR
2.0000 g | INTRAMUSCULAR | Status: DC
Start: 2022-03-21 — End: 2022-03-21

## 2022-03-20 MED ORDER — FENTANYL 2500MCG IN NS 250ML (10MCG/ML) PREMIX INFUSION: 0.0000 ug/h | INTRAVENOUS | Status: DC

## 2022-03-20 MED ORDER — METRONIDAZOLE 500 MG/100ML IV SOLN
500.0000 mg | Freq: Two times a day (BID) | INTRAVENOUS | Status: DC
  Administered 2022-03-20: 500 mg via INTRAVENOUS
  Filled 2022-03-20 (×2): qty 100

## 2022-03-20 MED ORDER — ACETAMINOPHEN 325 MG PO TABS: 650.0000 mg | ORAL_TABLET | Freq: Four times a day (QID) | ORAL | Status: DC | PRN

## 2022-03-20 MED ORDER — CALCIUM GLUCONATE-NACL 2-0.675 GM/100ML-% IV SOLN
2.0000 g | Freq: Once | INTRAVENOUS | Status: AC
  Administered 2022-03-20: 2000 mg via INTRAVENOUS
  Filled 2022-03-20: qty 100

## 2022-03-20 MED ORDER — DEXTROSE 50 % IV SOLN: 25.0000 g | INTRAVENOUS | Status: AC

## 2022-03-20 MED ORDER — SODIUM CHLORIDE 0.9 % IV BOLUS
500.0000 mL | Freq: Once | INTRAVENOUS | Status: AC
  Administered 2022-03-20: 500 mL via INTRAVENOUS

## 2022-03-20 MED ORDER — SODIUM BICARBONATE 8.4 % IV SOLN
50.0000 meq | Freq: Once | INTRAVENOUS | Status: AC
  Administered 2022-03-20: 50 meq via INTRAVENOUS
  Filled 2022-03-20: qty 50

## 2022-03-20 MED ORDER — SODIUM ZIRCONIUM CYCLOSILICATE 10 G PO PACK
10.0000 g | PACK | Freq: Once | ORAL | Status: AC
  Administered 2022-03-20: 10 g
  Filled 2022-03-20: qty 1

## 2022-03-20 MED ORDER — HYDROCORTISONE SOD SUC (PF) 100 MG IJ SOLR
100.0000 mg | Freq: Two times a day (BID) | INTRAMUSCULAR | Status: DC
  Administered 2022-03-20: 100 mg via INTRAVENOUS
  Filled 2022-03-20: qty 2

## 2022-03-20 MED ORDER — ALBUTEROL SULFATE (2.5 MG/3ML) 0.083% IN NEBU
10.0000 mg | INHALATION_SOLUTION | Freq: Once | RESPIRATORY_TRACT | Status: AC
  Administered 2022-03-20: 10 mg via RESPIRATORY_TRACT
  Filled 2022-03-20: qty 12

## 2022-03-20 MED ORDER — MIDAZOLAM-SODIUM CHLORIDE 100-0.9 MG/100ML-% IV SOLN: 0.0000 mg/h | INTRAVENOUS | Status: DC

## 2022-03-20 MED ORDER — AMIODARONE HCL IN DEXTROSE 360-4.14 MG/200ML-% IV SOLN
30.0000 mg/h | INTRAVENOUS | Status: DC
Start: 2022-03-20 — End: 2022-03-21

## 2022-03-20 MED ORDER — CHLORHEXIDINE GLUCONATE 0.12% ORAL RINSE (MEDLINE KIT)
15.0000 mL | Freq: Two times a day (BID) | OROMUCOSAL | Status: DC
  Administered 2022-03-20: 15 mL via OROMUCOSAL

## 2022-03-20 MED ORDER — MIDAZOLAM HCL 2 MG/2ML IJ SOLN: 1.0000 mg | INTRAMUSCULAR | Status: DC | PRN

## 2022-03-20 MED ORDER — DIPHENHYDRAMINE HCL 50 MG/ML IJ SOLN: 25.0000 mg | INTRAMUSCULAR | Status: DC | PRN

## 2022-03-21 LAB — POCT I-STAT 7, (LYTES, BLD GAS, ICA,H+H)
Acid-base deficit: 24 mmol/L — ABNORMAL HIGH (ref 0.0–2.0)
Bicarbonate: 5.5 mmol/L — ABNORMAL LOW (ref 20.0–28.0)
Calcium, Ion: 0.96 mmol/L — ABNORMAL LOW (ref 1.15–1.40)
HCT: 30 % — ABNORMAL LOW (ref 36.0–46.0)
Hemoglobin: 10.2 g/dL — ABNORMAL LOW (ref 12.0–15.0)
O2 Saturation: 98 %
Patient temperature: 36.9
Potassium: 4.8 mmol/L (ref 3.5–5.1)
Sodium: 141 mmol/L (ref 135–145)
TCO2: 6 mmol/L — ABNORMAL LOW (ref 22–32)
pCO2 arterial: 20.9 mmHg — ABNORMAL LOW (ref 32–48)
pH, Arterial: 7.023 — CL (ref 7.35–7.45)
pO2, Arterial: 139 mmHg — ABNORMAL HIGH (ref 83–108)

## 2022-03-22 LAB — URINE CULTURE: Culture: NO GROWTH

## 2022-03-24 LAB — CULTURE, BLOOD (ROUTINE X 2): Culture: NO GROWTH

## 2022-03-25 LAB — CULTURE, BLOOD (ROUTINE X 2): Culture: NO GROWTH

## 2022-03-25 NOTE — Procedures (Signed)
Central Venous Catheter Insertion Procedure Note  SPIRITUAL BACINO  [id number]  [birth year]  Date:2022/04/11  Time:4:55 AM   Provider Performing:Rawad Bochicchio R Telesha Deguzman   Procedure: Insertion of Non-tunneled Central Venous [phone]) with US guidance [id number])   Indication(s) Medication administration  Consent Risks of the procedure as well as the alternatives and risks of each were explained to the patient and/or caregiver.  Consent for the procedure was obtained and is signed in the bedside chart  Anesthesia Topical only with 1% lidocaine   Timeout Verified patient identification, verified procedure, site/side was marked, verified correct patient position, special equipment/implants available, medications/allergies/relevant history reviewed, required imaging and test results available.  Sterile Technique Maximal sterile technique including full sterile barrier drape, hand hygiene, sterile gown, sterile gloves, mask, hair covering, sterile ultrasound probe cover (if used).  Procedure Description Area of catheter insertion was cleaned with chlorhexidine and draped in sterile fashion.  With real-time ultrasound guidance a central venous catheter was placed into the right internal jugular vein. Nonpulsatile blood flow and easy flushing noted in all ports.  The catheter was sutured in place and sterile dressing applied.  Complications/Tolerance None; patient tolerated the procedure well. Chest X-ray is ordered to verify placement for internal jugular or subclavian cannulation.   Chest x-ray is not ordered for femoral cannulation.  EBL Minimal  Specimen(s) None   Otilio Carpen Laylanie Kruczek, PA-C

## 2022-03-25 NOTE — Sepsis Progress Note (Signed)
Notified provider of need to order repeat lactic acid since the 2nd LA was higher than the first. Verbal order to repeat.lactic acid.  ?

## 2022-03-25 NOTE — Progress Notes (Signed)
Critical ABG results given to CCM MD. No new orders received at this time.  ?

## 2022-03-25 NOTE — Progress Notes (Signed)
Echocardiogram ?2D Echocardiogram has been performed. ? ?Warren Lacy Charlisha Market RDCS ?09-Apr-2022, 8:39 AM ?

## 2022-03-25 NOTE — Progress Notes (Addendum)
ANTICOAGULATION & ANTIBIOTIC CONSULT NOTE ? ?Pharmacy Consult for Heparin +  Vancomycin/Cefepime ?Indication: atrial fibrillation + sepsis ? ?Allergies  ?Allergen Reactions  ? Azithromycin Nausea And Vomiting  ? Cardizem [Diltiazem Hcl] Nausea And Vomiting  ? Codeine Other (See Comments)  ?  Made the patient "feel weird" ?  ? Coreg [Carvedilol] Nausea And Vomiting  ? Demerol [Meperidine] Other (See Comments)  ?  Disorientation ?  ? Erythromycin Nausea And Vomiting and Other (See Comments)  ?  Made the patient "very sick"  ? Inderal [Propranolol] Other (See Comments)  ?  "Made me cry"  ? Procardia [Nifedipine] Other (See Comments)  ?  "Disoriented and sick"  ? Wellbutrin [Bupropion] Nausea Only  ? Zoloft [Sertraline Hcl] Other (See Comments)  ?  Could not talk, made the patient stiff  ? ? ?Patient Measurements: ?Height: 5\' 3"  (160 cm) ?Weight: 78.7 kg (173 lb 9.6 oz) ?IBW/kg (Calculated) : 52.4 ?Heparin dosing weight = 70kg ? ?Vital Signs: ?Temp: 97.2 ?F (36.2 ?C) (04/26 1315) ?Temp Source: Bladder (04/26 1200) ?BP: 152/129 (04/26 1100) ?Pulse Rate: 106 (04/26 0530) ? ?Labs: ?Recent Labs  ?  03/17/2022 ?2105 03/16/2022 ?2116 03/15/2022 ?2346 03/22/2022 ?2359 2022/04/15 ?0245 04-15-22 ?[id number] 15-Apr-2022 ?[id number] 2022-04-15 ?1010  ?HGB 10.9*   < >  --  10.5* 11.9*  --  11.6*  --   ?HCT 39.3   < >  --  31.0* 35.0*  --  34.0*  --   ?PLT 263  --   --   --   --   --   --   --   ?APTT 34  --   --   --   --   --   --  119*  ?LABPROT 29.8*  --   --   --   --   --   --   --   ?INR 2.9*  --   --   --   --   --   --   --   ?HEPARINUNFRC  --   --   --   --   --   --   --  >1.10*  ?CREATININE 1.13*  --   --   --   --  1.38*  --  1.73*  ?CKTOTAL  --   --   --   --   --   --   --  85  ?TROPONINIHS 54*  --  46*  --   --   --   --   --   ? < > = values in this interval not displayed.  ? ? ? ?Estimated Creatinine Clearance: 25.3 mL/min (A) (by C-G formula based on SCr of 1.73 mg/dL (H)). ? ? ?Assessment: ?81 y/o F presents to the ED with AMS and  respiratory failure requiring intubation. On apixaban PTA for Afib, holding apixaban and starting IV heparin. Last dose apixaban was 4/25 at 1430, so will use aPTT to guide heparin dosing.  aPTT drawn early and is supra-therapeutic at 119 sec.  Heparin level is elevated as expected due to the effect of Eliquis.  Labs drawn appropriately; no bleeding reported. ? ? ?Pharmacy also dosing vancomycin and cefepime for sepsis.  Patient is also on Flagyl.  Renal function worsening, Tmax 101.1, WBC 15.2, lactate > 9. ? ?Vanc 4/25 >> ?Cefepime 4/25 >> ?Flagyl 4/25 >> ?  ?4/25 UCx -  ?4/26 BCx -  ?4/26 MRSA PCR - negative ? ?Goal of Therapy:  ?Heparin level 0.3-0.7 units/ml ?  aPTT 66-102 seconds ?Monitor platelets by anticoagulation protocol: Yes ?Vanc AUC 400-550 ?  ?Plan:  ?Reduce heparin drip to 750 units/hr ?Repeat aPTT in 8 hours ?Daily CBC, heparin level, and aPTT ? ?Reduce vanc to 1000mg  IV Q48H using SCr 1.73 ?Reduce cefepime to 2gm IV Q24H ?Flagyl 500mg  IV Q12H ?Monitor renal fxn, clinical progress - may need to dose vanc per level ? ?Kayleah Appleyard D. Mina Marble, PharmD, BCPS, BCCCP ?04-05-22, 1:44 PM ? ?

## 2022-03-25 NOTE — Progress Notes (Signed)
eLink Physician-Brief Progress Note ?Patient Name: [name] ?DOB: 28-May-1941 ?MRN: [id number] ? ? ?Date of Service ? 2022/03/25  ?HPI/Events of Note ? Patient admitted with altered mental status, septic shock, acute respiratory failure s/p intubation and mechanical ventilation, atrial fibrillation with RVR, and acute kidney and liver injury, work up is in progress.  ?eICU Interventions ? New Patient Evaluation.  ? ? ? ?  ? ?Kerry Kass Shyne Resch ?2022/03/25, 4:27 AM ?

## 2022-03-25 NOTE — Progress Notes (Signed)
eLink Physician-Brief Progress Note ?Patient Name: [name] ?DOB: [birth year] ?MRN: [id number] ? ? ?Date of Service ? 04/15/2022  ?HPI/Events of Note ? K+ 6.3  ?eICU Interventions ? Hyperkalemia treatment orders entered.  ? ? ? ?  ? ?Kerry Kass Rhylie Stehr ?04/15/22, 6:34 AM ?

## 2022-03-25 NOTE — Plan of Care (Signed)
?  Interdisciplinary Goals of Care Family Meeting ? ? ?Date carried out:: 03/06/2022 ? ?Location of the meeting: Phone conference ? ?Member's involved: Nurse Practitioner and Family Member or next of kin ? ?Durable Power of Programme researcher, broadcasting/film/video maker:   Daughter [name] ? ?Discussion: We discussed goals of care for [name] .  ? ?We reviewed the clinical course of the evening and morning. I explained that despite our medical therapy that [name] was continuing to deteriorate clinically. ? ?It was discuss that with how sick [name] was that CPR in this instance would do more harm than good to the patient. When DNR status was discussed the family they agreed to make the patient a DNR. ? ?Family is in route to see patient. ? ? ?Code status: Full DNR ? ?Disposition: Continue current acute care ? ? ?Time spent for the meeting: 15 minutes ? ?Eliezer Champagne ?03/05/2022, 8:32 AM ? ?

## 2022-03-25 NOTE — H&P (Addendum)
? ?NAME:  [name], MRN:  [id number], DOB:  09-23-1941, LOS: 0 ?ADMISSION DATE:  03/12/2022, CONSULTATION DATE:  11-Apr-2022 ?REFERRING MD:  Horton, CHIEF COMPLAINT:  weakness  ? ?History of Present Illness:  ?81 yo woman with MMP, recent PNA within the past month (completed course of abtx), lived in a rehab facility with her husband, brought to ED for altered mental status.  Found to be minimally responsive.   Per husband, she was fatigued yesterday with difficulty swallowing.  Sleepy and in bed all day today, confused when husband woke her.   ?Has been on and off 2-3 L [place] oxygen for past several months.  ? ?EMS gave 200cc IVF.  ? ? ?In ED: febrile initially.   ?Noted to have multiple lidocaine patches (3, all started today, B shoulders and L knee) ?Was able to point to L humerus to apparently indicate pain.   ?Was given cefepime, vanc and flagyl.  ?Met acidosis, anion gap.   ?Acetaminophen, fentanyl , narcan, metop also given. As well as total of 2 L LR.  ? ?Patient seemed to wake up more once she was started on levophed (?), per ED doc she did wake up and start shouting with narcan.  MAR at snf reviewed by ED staff, patient does not take narcotics there.   ? ?Pertinent  Medical History  ?Anxiety/depression ?Chronic pain  ?CAD ?HTN ?HLD  ?Obesity ?Opiate addiction (?not confirmed, per chart review) ?DM ?Stroke hx  - baseline L hemiparesis and asymmetry of face.  ?Afib (on eliquis and metoprolol) ?CHF ?Hypothyroid ?ON 2-3 L [place] oxygen at baseline. ?Not ambulatory for past several years.  ? ? ?Significant Hospital Events: ?Including procedures, antibiotic start and stop dates in addition to other pertinent events   ? ? ?Interim History / Subjective:  ? ?Objective   ?Blood pressure (!) 160/102, pulse 67, temperature 100.1 ?F (37.8 ?C), resp. rate (!) 23, height '5\' 3"'  (1.6 m), weight 78.7 kg, SpO2 98 %. ?   ?Vent Mode: PRVC ?FiO2 (%):  [100 %] 100 % ?Set Rate:  [18 bmp] 18 bmp ?Vt Set:  [420 mL] 420 mL ?PEEP:  [5  cmH20] 5 cmH20 ?Plateau Pressure:  [19 cmH20] 19 cmH20  ? ?Intake/Output Summary (Last 24 hours) at 2022-04-11 0218 ?Last data filed at 04/11/22 0024 ?Gross per 24 hour  ?Intake --  ?Output 200 ml  ?Net -200 ml  ? ?Filed Weights  ?  2158  ?Weight: 78.7 kg  ? ? ?Examination: ?General: intubated, slight grimace  ?HENT: MMM pupils equal round , minimally reactive  ?Lungs: CTAB  ?Cardiovascular: tachycardia  ?Abdomen: slightly distended, minimal bowel sounds ?Extremities: trace edema ?Neuro: sedated  ?GU: foley  ? ?CT Head: IMPRESSION: ?1. No CT evidence for acute intracranial abnormality. ?2. Extensive chronic infarct involving the right frontal and ?parietal lobes progressed compared to prior. Chronic left ?temporoparietal infarct. ?3. Atrophy and chronic small vessel ischemic change of the white ?Matter ? ?CT chest abd pelv ?IMPRESSION: ?1. Thoracoabdominal aortic atherosclerosis without dissection or ?acute findings. ?2. Moderate bilateral pleural effusions with pulmonary edema, ?consistent with CHF. There is marked contrast refluxing into the ?hepatic veins and IVC, consistent with elevated right heart ?pressures. Small pericardial effusion ?3. Large amount of stool distending the rectum with associated ?rectal wall thickening and perirectal fat stranding, consistent with ?fecal impaction. ?4. Prior right nephrectomy. ?5. Colonic diverticulosis without focal diverticulitis. ?6. Mild multifocal mediastinal adenopathy, likely reactive in the ?setting of heart failure. ?7. Additional ancillary and chronic  findings as described. ?  ?  ? ?Resolved Hospital Problem list   ? ? ?Assessment & Plan:  ?Altered mental status, acute encephalopathy:  ?Appears to be toxic metabolic.  Worsening acidosis and ongoing intermittent hypotension.  ?Did become more arousable with improved BP.  ?Strangely although she does not take opiates per SNF, she did become more arousable with narcan.   ? ?Hypotension: metabolic acidosis,  lactic acidosis, leukocytosis, and fever on admission suggestive of septic shock.  BNP somewhat elevated.  Possible cardiac component as well but seems more septic overall.  ?Will get Arterial line for more consistent BP measurements and Pressor titration.  ?Ongoing acidosis could be 2/2 period of hypotension.   ?Also consider possibility of mesenteric ischemia as no obvious source of sepsis has been found.  CT abd did show some fecal impaction, rectal wall thickening and associated stranding.  No recent abdominal complaints per family however.  For now will try to get BP controlled and reassess lactic.  Consider CTA abd pelvis to evaluate but would really need to be a better surgical candidate to make this worthwhile.  Will hold off for now especially while she is unstable.   ?Cont cefepime, vanc, flagyl.  ? ?Afib with RVR. Amio infusion.   ?Hep infusion once lines place.  Hold eliquis.  ?Check TSH>  ? ?Mild aki with Cr 1.13 (BL 0.9) ?Elevated transaminases. >2000/>1000.   ?Check [place] abd, r/o PVT.   ? ?BNP 558 ?Lactic acid increasing from 8.8 to >9 ?INR 2.9 ?Leukocytosis 15.2 ? ?Takes depakote and lexapro per notes. Hold off for now.   ?Best Practice (right click and "Reselect all SmartList Selections" daily)  ? ?Diet/type: NPO ?DVT prophylaxis: systemic heparin ?GI prophylaxis: N/A ?Lines: N/A ?Foley:  N/A ?Code Status:  full code ?Last date of multidisciplinary goals of care discussion [will need to discuss code status with daughter along with husband (he is Media planner but my judgement from speaking with him is that their daughter should also be involved in decision, she is on her way] ? ?Labs   ?CBC: ?Recent Labs  ?Lab 03/18/2022 ?2105 03/13/2022 ?2116 03/03/2022 ?2359  ?WBC 15.2*  --   --   ?NEUTROABS 11.6*  --   --   ?HGB 10.9* 12.9 10.5*  ?HCT 39.3 38.0 31.0*  ?MCV 94.5  --   --   ?PLT 263  --   --   ? ? ?Basic Metabolic Panel: ?Recent Labs  ?Lab 02/28/2022 ?2105 03/04/2022 ?2116 03/04/2022 ?2359  ?NA 143 141 140  ?K  5.4* 5.5* 5.2*  ?CL 111  --   --   ?CO2 15*  --   --   ?GLUCOSE 185*  --   --   ?BUN 21  --   --   ?CREATININE 1.13*  --   --   ?CALCIUM 9.7  --   --   ? ?GFR: ?Estimated Creatinine Clearance: 38.8 mL/min (A) (by C-G formula based on SCr of 1.13 mg/dL (H)). ?Recent Labs  ?Lab 02/23/2022 ?2100 03/05/2022 ?2105 03/18/2022 ?2346  ?WBC  --  15.2*  --   ?LATICACIDVEN 8.8*  --  >9.0*  ? ? ?Liver Function Tests: ?Recent Labs  ?Lab 03/08/2022 ?2105  ?AST 2,136*  ?ALT 1,086*  ?ALKPHOS 84  ?BILITOT 2.5*  ?PROT 6.9  ?ALBUMIN 3.6  ? ?No results for input(s): LIPASE, AMYLASE in the last 168 hours. ?No results for input(s): AMMONIA in the last 168 hours. ? ?ABG ?   ?Component Value Date/Time  ?  PHART 7.278 (L) 02/23/2022 2359  ? PCO2ART 24.1 (L) 03/22/2022 2359  ? PO2ART 99 03/01/2022 2359  ? HCO3 11.1 (L) 03/16/2022 2359  ? TCO2 12 (L) 03/02/2022 2359  ? ACIDBASEDEF 14.0 (H) 03/15/2022 2359  ? O2SAT 96 02/28/2022 2359  ?  ? ?Coagulation Profile: ?Recent Labs  ?Lab 03/15/2022 ?2105  ?INR 2.9*  ? ? ?Cardiac Enzymes: ?No results for input(s): CKTOTAL, CKMB, CKMBINDEX, TROPONINI in the last 168 hours. ? ?HbA1C: ?Hemoglobin A1C  ?Date/Time Value Ref Range Status  ?08/30/2019 05:06 PM 11.5 (A) 4.0 - 5.6 % Final  ?11/13/2018 05:24 PM 8.4 (A) 4.0 - 5.6 % Final  ? ?Hgb A1c MFr Bld  ?Date/Time Value Ref Range Status  ?09/17/2019 02:45 AM 9.6 (H) 4.8 - 5.6 % Final  ?  Comment:  ?  (NOTE) ?Pre diabetes:          5.7%-6.4% ?Diabetes:              >6.4% ?Glycemic control for   <7.0% ?adults with diabetes ?  ?05/19/2018 11:46 AM 9.3 (H) 4.8 - 5.6 % Final  ?  Comment:  ?  (NOTE) ?Pre diabetes:          5.7%-6.4% ?Diabetes:              >6.4% ?Glycemic control for   <7.0% ?adults with diabetes ?  ? ? ?CBG: ?No results for input(s): GLUCAP in the last 168 hours. ? ?Review of Systems:   ?Unable to assess ?  ?Past Medical History:  ?She,  has a past medical history of Abdominal discomfort, CAD (coronary artery disease), Chronic pain syndrome (09/10/2013),  [facility] Patient virus infection (1988), Excessive daytime sleepiness (12/05/2014), Fall, Hyperlipemia, Hypersomnia, persistent (09/10/2013), Hypertension, Narcotic addiction ([place]) (12/05/2014), Obesity, unspecifie

## 2022-03-25 NOTE — Progress Notes (Signed)
? ?NAME:  [name], MRN:  [id number], DOB:  1941/05/28, LOS: 0 ?ADMISSION DATE:  03/05/2022, CONSULTATION DATE:  18-Apr-2022 ?REFERRING MD:  Horton, CHIEF COMPLAINT:  weakness  ? ?History of Present Illness:  ?81 yo woman with MMP, recent PNA within the past month (completed course of abtx), lived in a rehab facility with her husband, brought to ED for altered mental status.  Found to be minimally responsive.   Per husband, she was fatigued yesterday with difficulty swallowing.  Sleepy and in bed all day today, confused when husband woke her.   ?Has been on and off 2-3 L [place] oxygen for past several months.  ? ?EMS gave 200cc IVF.  ? ?In ED: febrile initially.   ?Noted to have multiple lidocaine patches (3, all started today, B shoulders and L knee) ?Was able to point to L humerus to apparently indicate pain.   ?Was given cefepime, vanc and flagyl.  ?Met acidosis, anion gap.   ?Acetaminophen, fentanyl , narcan, metop also given. As well as total of 2 L LR.  ? ?Patient seemed to wake up more once she was started on levophed (?), per ED doc she did wake up and start shouting with narcan.  MAR at snf reviewed by ED staff, patient does not take narcotics there.   ? ?She remains critically ill. ? ?Pertinent  Medical History  ?Anxiety/depression, Chronic pain , CAD, HTN, HLD , Obesity, DM ?Stroke hx  - baseline L hemiparesis and asymmetry of face. , Afib (on eliquis and metoprolol), CHF, Hypothyroidism, ON 2-3 L [place] oxygen at baseline. ?Not ambulatory for past several years.  ? ? ?Significant Hospital Events: ?Including procedures, antibiotic start and stop dates in addition to other pertinent events   ?4/26 Admit, vanc, cefe, flagyl, cvc, aline ? ?Interim History / Subjective:  ?Worsening clinically overnight ? ?Levophed 40 ?Fentanyl 150 ?Vaso 0.04 ?Bicarb 169m/hr ?Amio 648m?Heparin gtt ? ?Unable to obtain subjective evaluation due to patient status ? ? ?Objective   ?Blood pressure 129/81, pulse (!) 106, temperature 99.8  ?F (37.7 ?C), resp. rate 13, height _0  (1.6 m), weight 78.7 kg, SpO2 93 %. ?   ?Vent Mode: PRVC ?FiO2 (%):  [50 %-100 %] 50 % ?Set Rate:  [18 bmp-24 bmp] 24 bmp ?Vt Set:  [420 mL] 420 mL ?PEEP:  [5 cmH20] 5 cmH20 ?Plateau Pressure:  [19 cmH20] 19 cmH20  ? ?Intake/Output Summary (Last 24 hours) at 02/2022-05-25749 ?Last data filed at 4/25-May-2023700 ?Gross per 24 hour  ?Intake 2954.31 ml  ?Output 300 ml  ?Net 2654.31 ml  ? ?Filed Weights  ? 02/24/2022 2158  ?Weight: 78.7 kg  ? ? ?Examination: ?General: In bed, frail appearing, unwell ?HEENT: MM pink/moist, anicteric, atraumatic ?Neuro: RASS -5, PERRL 45m67medated ?CV: S1S2, Afib, no m/r/g appreciated ?PULM:  clear in the upper lobes, clear in the lower lobes, trachea midline, chest expansion symmetric ?GI: firm, no bowel sounds appreciated, unable to assess for tenderness ?Extremities: warm/dry, LLE with edema, L pedal 2+ edema, capillary refill less than 3 seconds  ?Skin: no rashes or lesions noted ? ?Resolved Hospital Problem list   ? ? ?Assessment & Plan:  ?Shock: Undifferentiated ?Lactic acidosis, secondary to shock ?Tmax 100.1, WBC 15.2, PCT less than 0.10. CTA chest reviewed with Dr. OlaAnder Sladeo PE. BNP 558, troponin 54>46. Possible cardiac component vs sepsis. Fluid resusitated in ED. Concern for possibility of mesenteric ischemia due to no obvious source for sepsis. Maxed on levophed. Lactic acid remains  elevated and greater than 9. ?-Goal MAP 65. Continue Levophed and vaso. Adding Epi. ?-Adding Solucortef 172m BID ?-Continue Vanc, cefepime, flagyl. Narrow as cultures result ?-Continue Bicarb gtt ?-Consulting general surgery. Paged at [phone] ?-Continue trending lactic acid ?-Obtain ECHO ? ?Acute metabolic encephalopathy ?Suspect secondary to sepsis. Initial head CT negative for acute process. ?-Stop fentanyl for neuro exam ?-Goal RASS 0 to -1 ? ?Acute on chronic respiratory failure with hypoxia ?Secondary to sepsis. Documented SPO2 of 61. On 2-3L [place] at  baseline ?-LTVV strategy with tidal volumes of 4-8 cc/kg ideal body weight ?-Goal plateau pressures less than 30 and driving pressures less than 15 ?-Wean PEEP/FiO2 for SpO2 92-98% ?-VAP bundle ?-Daily SAT and SBT ?-RASS goal 0 to -1 ?-Follow intermittent CXR and ABG PRN ?-Fentanly GTT for pain ? ?Afib with RVR ?TSH WNL. HX of afib ?-Continue heparin infusion ?-continue amio gtt ?-hold eliquis ? ?Hyperkalemia ?K 6.3. Given lokelma, albuterol, insulin/dextrose ?-Repeat K level several hours ?-Cont tele ?-AM BMP ? ?Troponin elevation ?Suspect secondary demand. Downtrending. ?-Monitor tele. ? ??Fecal impaction ?On CTA 4/25- Large amount of stool distending the rectum with associated ?rectal wall thickening and perirectal fat stranding, consistent with ?fecal impaction. ?-Will attempt to disimpact ? ?LLE- pedal edema ?Swelling not symmetrical. D-dimer 11.4. Was previously on eliquis ?-LLE [place]?-On heparin GTT ? ?HX HTN ?HX HLD ?HX CAD ?HX CHF ?-Holding home antihypertensives ?-continue statin ?-Follow up echo ?-Supportive care as above ? ?Transaminitis ?Suspect shock liver. AST 2.1K, ALT 1.0K. Lupase WNL ?-Supportive care ?-Check RUQ [place] ?-Follow LFTs ? ?Hypothyroidism ?-Synthroid ? ? ?Best Practice (right click and "Reselect all SmartList Selections" daily)  ? ?Diet/type: NPO ?DVT prophylaxis: systemic heparin ?GI prophylaxis: PPI ?Lines: Central line, Arterial Line, and yes and it is still needed ?Foley:  Yes, and it is still needed ?Code Status:  full code ?Last date of multidisciplinary goals of care discussion: Called husband DJamine Wingatethis am. Discussed how Ms JNanciwas getting sicker despite our medical interventions. We discussed if Ms. JRhenawould want chest compressions if her heart were to stop. He is wanting the full scope of care including CPR at this time. ? ? ?Additional critical care time: 31 minutes ?  ? ? ?TRedmond School, MSN, APRN, AGACNP-BC ? Pulmonary & Critical Care   ?4May 26, 2023, 7:49 AM ? ?Please see [email or web address] for pager details ? ?If no response, please call [phone] ?After hours, please call Elink at [phone]? ? ? ?

## 2022-03-25 NOTE — Progress Notes (Signed)
Pt extubated to comfort care measures per physician order/family request. Pt suctioned orally/via ETT prior. Family remains at bedside. RT will continue to be available as needed.  ?

## 2022-03-25 NOTE — Procedures (Signed)
Arterial Catheter Insertion Procedure Note  MAYRENE BASTARACHE  [id number]  1941-08-26  Date:Apr 08, 2022  Time:4:56 AM    Provider Performing: Darcella Gasman Kamrie Fanton    Procedure: Insertion of Arterial Line (62831) with US guidance (51761)   Indication(s) Blood pressure monitoring and/or need for frequent ABGs  Consent Risks of the procedure as well as the alternatives and risks of each were explained to the patient and/or caregiver.  Consent for the procedure was obtained and is signed in the bedside chart  Anesthesia None   Time Out Verified patient identification, verified procedure, site/side was marked, verified correct patient position, special equipment/implants available, medications/allergies/relevant history reviewed, required imaging and test results available.   Sterile Technique Maximal sterile technique including full sterile barrier drape, hand hygiene, sterile gown, sterile gloves, mask, hair covering, sterile ultrasound probe cover (if used).   Procedure Description Area of catheter insertion was cleaned with chlorhexidine and draped in sterile fashion. With real-time ultrasound guidance an arterial catheter was placed into the right radial artery.  Appropriate arterial tracings confirmed on monitor.     Complications/Tolerance None; patient tolerated the procedure well.   EBL Minimal   Specimen(s) None  Darcella Gasman Sarah Zerby, PA-C

## 2022-03-25 NOTE — Consult Note (Signed)
[facility] ?Consult Note ? ?Bufford Spikes ?July 30, 1941  ?[id number].   ? ?Requesting MD: Trevor Mace ?Chief Complaint/Reason for Consult: shock ? ?HPI:  ?[name] is an 81yo female PMH A Fib on eliquis, CAD, hx CVA with L hemiparesis, recent pneumonia on home O2 who presented to [facility] yesterday with altered mental status. Found to be minimally responsive.   Per husband, she was fatigued yesterday with difficulty swallowing.  Sleepy and in bed all day today, confused when husband woke her.  she is now intubated and maxed out of pressors without clear source of her shock. General surgery asked to see for possible mesenteric ischemia. ? ?Abdominal surgical history: none per chart review ?Nonambulatory, resides in SNF ? ?Review of Systems  ?Unable to perform ROS: Intubated  ? ? ?Family History  ?Problem Relation Age of Onset  ? Hypertension Mother   ? Diabetes Father   ? Heart attack Father   ? Heart attack Brother   ? Heart attack Brother   ? ? ?Past Medical History:  ?Diagnosis Date  ? Abdominal discomfort   ? "due to medication intolerance"  ? CAD (coronary artery disease)   ? previous stent  ? Chronic pain syndrome 09/10/2013  ? Dr Nelva Bush,   ? [facility] Patient virus infection 1988  ? Excessive daytime sleepiness 12/05/2014  ? Patient has not been seen for a sleep study as ordered, and used adderall to keep alert in daytime.  She agreed  In a contract not to have any scheduled medication for pain treatment from [facility] and will not receive refills for Adderall, which was initiated by dr Orland Penman, PCP>   ? Fall   ? Hyperlipemia   ? Hypersomnia, persistent 09/10/2013  ? Patient on  Stimulants .  ? Hypertension   ? Narcotic addiction ([place]) 12/05/2014  ? Obesity, unspecified 09/10/2013  ? Shoulder joint pain   ? both shoulders  ? Stroke [facility])   ? ? ?Past Surgical History:  ?Procedure Laterality Date  ? ANGIOPLASTY  03/01/2002  ? cutting balloon mid RCA  ? BACK SURGERY    ? CARDIAC CATHETERIZATION  01/26/2007  ? mild  diffuse CAD  ? CARDIAC CATHETERIZATION  10/23/2009  ? nonobstructive CAD,60% prox RCA,50% prox LAD, 50% mid ramus  ? CORONARY STENT PLACEMENT  03/15/2005  ? distal RCA  ? NEPHRECTOMY    ? TEE WITHOUT CARDIOVERSION N/A 07/03/2018  ? Procedure: TRANSESOPHAGEAL ECHOCARDIOGRAM (TEE);  Surgeon: Skeet Latch, MD;  Location: [facility];  Service: Cardiovascular;  Laterality: N/A;  ? ? ?Social History:  reports that she has never smoked. She has never used smokeless tobacco. She reports that she does not drink alcohol and does not use drugs. ? ?Allergies:  ?Allergies  ?Allergen Reactions  ? Azithromycin Nausea And Vomiting  ? Cardizem [Diltiazem Hcl] Nausea And Vomiting  ? Codeine Other (See Comments)  ?  Made the patient "feel weird" ?  ? Coreg [Carvedilol] Nausea And Vomiting  ? Demerol [Meperidine] Other (See Comments)  ?  Disorientation ?  ? Erythromycin Nausea And Vomiting and Other (See Comments)  ?  Made the patient "very sick"  ? Inderal [Propranolol] Other (See Comments)  ?  "Made me cry"  ? Procardia [Nifedipine] Other (See Comments)  ?  "Disoriented and sick"  ? Wellbutrin [Bupropion] Nausea Only  ? Zoloft [Sertraline Hcl] Other (See Comments)  ?  Could not talk, made the patient stiff  ? ? ?Medications Prior to Admission  ?Medication Sig Dispense Refill  ?  acetaminophen (TYLENOL) 500 MG tablet Take 500 mg by mouth in the morning, at noon, and at bedtime.    ? aspirin EC 81 MG EC tablet Take 1 tablet (81 mg total) by mouth daily. 30 tablet 0  ? atorvastatin (LIPITOR) 40 MG tablet TAKE 1 TABLET BY MOUTH ONCE DAILY AT  6PM (Patient taking differently: Take 40 mg by mouth daily.) 90 tablet 0  ? azelastine (ASTELIN) 0.1 % nasal spray Place 1 spray into both nostrils 2 (two) times daily.    ? Baclofen 5 MG TABS Take 5 mg by mouth 2 (two) times daily.    ? benzonatate (TESSALON) 200 MG capsule Take 200 mg by mouth 2 (two) times daily.    ? calcium carbonate (TUMS EX) 750 MG chewable tablet Chew 2 tablets by mouth  daily.    ? Cholecalciferol (VITAMIN D3) 25 MCG (1000 UT) CAPS Take by mouth See admin instructions. Give 5 capsules once on Fridays    ? ELIQUIS 5 MG TABS tablet Take 1 tablet by mouth twice daily (Patient taking differently: Take 5 mg by mouth 2 (two) times daily.) 60 tablet 0  ? furosemide (LASIX) 20 MG tablet Take 20 mg by mouth 2 (two) times daily.    ? gabapentin (NEURONTIN) 600 MG tablet Take 600 mg by mouth 3 (three) times daily.    ? LANTUS SOLOSTAR 100 UNIT/ML Solostar Pen Inject 20 Units into the skin at bedtime. (Patient taking differently: Inject 37 Units into the skin at bedtime.) 15 mL 11  ? levothyroxine (SYNTHROID) 50 MCG tablet TAKE 1 TABLET BY MOUTH ONCE DAILY BEFORE BREAKFAST (Patient taking differently: Take 50 mcg by mouth daily before breakfast.) 30 tablet 0  ? lidocaine (LIDODERM) 5 % Place 1 patch onto the skin daily. Remove & Discard patch within 12 hours or as directed by MD 30 patch 0  ? magnesium oxide (MAG-OX) 400 MG tablet Take 800 mg by mouth 2 (two) times daily.     ? Menthol, Topical Analgesic, (BIOFREEZE) 4 % GEL Apply 1 application topically at bedtime.    ? metFORMIN (GLUCOPHAGE-XR) 750 MG 24 hr tablet Take 750 mg by mouth 2 (two) times daily.    ? metoprolol succinate (TOPROL-XL) 25 MG 24 hr tablet Take 25 mg by mouth daily.    ? potassium chloride (KLOR-CON) 10 MEQ tablet TAKE 1  BY MOUTH ONCE DAILY (Patient taking differently: Take 10 mEq by mouth every other day.) 90 tablet 1  ? venlafaxine XR (EFFEXOR XR) 150 MG 24 hr capsule Take 1 capsule (150 mg total) by mouth daily with breakfast. 90 capsule 3  ? ACCU-CHEK SOFTCLIX LANCETS lancets USE TO TEST FOUR TIMES DAILY (Patient taking differently: 1 each by Other route See admin instructions. Use as directed to test blood sugar four times daily.) 100 each 5  ? blood glucose meter kit and supplies Dispense based on patient and insurance preference. Use up to four times daily as directed. (FOR ICD-9 250.00, 250.01). (Patient taking  differently: 1 each by Other route See admin instructions. Use up to four times daily as directed. (FOR ICD-9 250.00, 250.01).) 1 each 0  ? Cholecalciferol (VITAMIN D3) 5000 units TBDP Take 5,000 Units by mouth daily.  (Patient not taking: Reported on 03-25-22)    ? divalproex (DEPAKOTE) 125 MG DR tablet Take 125 mg by mouth 2 (two) times daily. (Patient not taking: Reported on 2022/03/25)    ? ferrous gluconate (FERGON) 324 MG tablet Take 324 mg by mouth  every other day. (Patient not taking: Reported on 2022-04-05)    ? fexofenadine (ALLEGRA) 180 MG tablet Take 180 mg by mouth daily as needed for allergies.  (Patient not taking: Reported on 04-05-2022)    ? gabapentin (NEURONTIN) 100 MG capsule Take 2 capsules (200 mg total) by mouth 3 (three) times daily. (Patient not taking: Reported on 05-Apr-2022)    ? glucose blood (ACCU-CHEK AVIVA PLUS) test strip USE TO TEST FOUR TIMES DAILY (Patient taking differently: 1 each by Other route See admin instructions. Use as directed to test four times daily.) 100 each 0  ? HUMALOG 100 UNIT/ML injection Inject into the skin. (Patient not taking: Reported on 04-05-22)    ? Insulin Syringe-Needle U-100 (INSULIN SYRINGE .5CC/31GX5/16") 31G X 5/16" 0.5 ML MISC Use as directed (Patient taking differently: 1 application by Other route See admin instructions. Use as directed) 100 each 0  ? lidocaine (LMX) 4 % cream Apply 1 application topically 3 (three) times daily as needed (pain). (Patient not taking: Reported on 04/05/22)    ? metoprolol tartrate (LOPRESSOR) 50 MG tablet Take 1 tablet by mouth twice daily (Patient not taking: Reported on 04/05/22) 60 tablet 1  ? Misc. Devices (HUGO ROLLING WALKER ELITE) MISC 1 Units by Does not apply route daily as needed. (Patient taking differently: 1 Units by Other route daily as needed (walking). ) 1 each 0  ? oxyCODONE (OXY IR/ROXICODONE) 5 MG immediate release tablet Take 2.5 mg by mouth every 8 (eight) hours as needed for severe pain.  (Patient not taking: Reported on 2022/04/05)    ? ? ?Prior to Admission medications   ?Medication Sig Start Date End Date Taking? Authorizing Provider  ?acetaminophen (TYLENOL) 500 MG tablet Take 500 mg by m

## 2022-03-25 NOTE — Progress Notes (Addendum)
Spouse updated at bedside ? ?Very poor prognosis ? ?ABG noted, remains profoundly acidemic ?Labs reviewed ? ? ?Bicarb pushes given ?Increased rate of bicarb drip to 200 cc an hour ? ?Additional critical care time 25 minutes ?

## 2022-03-25 NOTE — Death Summary Note (Signed)
?DEATH SUMMARY  ? ?Patient Details  ?Name: [name] ?MRN: [id number] ?DOB: 06/19/1941 ? ?Admission/Discharge Information  ? ?Admit Date:  04-02-22  ?Date of Death: Date of Death: 03-Apr-2022  ?Time of Death: Time of Death: 50  ?Length of Stay: 0  ?Referring Physician: Horald Pollen, MD  ? ?Reason(s) for Hospitalization  ?Patient admitted Apr 03, 2022 with weakness ? ?Diagnoses  ?Preliminary cause of death:  ?Septic shock ?Secondary Diagnoses (including complications and co-morbidities):  ?Principal Problem: ?  Shock ([place]) ?Active Problems: ?  Atrial fibrillation with RVR ([place]) ?  Acute on chronic respiratory failure with hypoxia (HCC) ?  Encephalopathy ?Congestive heart failure with decompensation ?Coronary artery disease ?Hypertension ?Chronic pain ?Obesity ?Shock liver from sepsis ?Pulmonary hypertension ? ?Brief Hospital Course (including significant findings, care, treatment, and services provided and events leading to death)  ?[name] is a 81 y.o. year old female who was admitted for altered mental status, confusion. ?In severe metabolic acidosis at presentation, requiring pressors ?Required pressors escalation to maximal doses of Levophed, vasopressin and epinephrine with continuing inability to maintain MAP of 65.  Severe metabolic acidosis with elevated lactate. ?Concerned that lactic acidosis may be related to bowel ischemia as no other signs of infection was found to account for a septic picture, did request surgical evaluation -she is not a surgical candidate as we have not been able to get her stable enough for any further intervention. ?Echocardiogram did reveal ejection fraction of less than 20% compared with 65% in October 2020, moderate dysfunction of the right ventricle also noted with moderately elevated right ventricular pressures. ?With patient on maximal doses of pressors and unable to maintain perfusion, discussions had with family and patient was made a DO NOT RESUSCITATE  status.  We continued to aggressively resuscitate the patient but could not get her to turn around. ?No gag reflex, no corneals, pupils dilated, breathing slightly above the ventilator settings ?Once patient's spouse and other family members came in, decision was made to make her comfort measures only-prognosis is grim. ?She was compassionately extubated and succumbed to her illness at 1812 on 04-03-22 ? ?Because of that-septic shock ? ? ?Pertinent Labs and Studies  ?Significant Diagnostic Studies ?CT Head Wo Contrast ? ?Result Date: 04-02-2022 ?CLINICAL DATA:  Mental status change EXAM: CT HEAD WITHOUT CONTRAST TECHNIQUE: Contiguous axial images were obtained from the base of the skull through the vertex without intravenous contrast. RADIATION DOSE REDUCTION: This exam was performed according to the departmental dose-optimization program which includes automated exposure control, adjustment of the mA and/or kV according to patient size and/or use of iterative reconstruction technique. COMPARISON:  CT brain 12/21/2019 FINDINGS: Brain: No acute territorial infarction, hemorrhage or intracranial mass. Chronic left temporoparietal infarct. Extensive encephalomalacia within the right frontal and parietal lobes, increased compared to prior and consistent with large chronic infarct. Mild ex vacuo dilatation of right lateral ventricle. Atrophy and chronic small vessel ischemic changes of the white matter. Vascular: No hyperdense vessels.  Carotid vascular calcification Skull: Normal. Negative for fracture or focal lesion. Sinuses/Orbits: No acute finding. Other: None IMPRESSION: 1. No CT evidence for acute intracranial abnormality. 2. Extensive chronic infarct involving the right frontal and parietal lobes progressed compared to prior. Chronic left temporoparietal infarct. 3. Atrophy and chronic small vessel ischemic change of the white matter Electronically Signed   By: Donavan Foil M.D.   On: Apr 02, 2022 23:43  ? ?DG  CHEST PORT 1 VIEW ? ?Result Date: 2022/04/03 ?CLINICAL DATA:  81 year old female status post  central line placement. EXAM: PORTABLE CHEST 1 VIEW COMPARISON:  Chest x-ray 2022/04/03. FINDINGS: An endotracheal tube is in place with tip 3.9 cm above the carina. There is a right-sided internal jugular central venous catheter with tip terminating in the distal superior vena cava. Nasogastric tube extends into the antral pre-pyloric region of the stomach. Persistent left suprahilar opacities favored to reflect areas of subsegmental atelectasis and/or scarring, similar to the recent chest radiograph. No confluent consolidative airspace disease. Small bilateral pleural effusions. Mild diffuse interstitial prominence. Mild cephalization of the pulmonary vasculature. Mild cardiomegaly. Upper mediastinal contours are within normal limits. Atherosclerotic calcifications in the thoracic aorta. IMPRESSION: 1. Support apparatus, as above. 2. The appearance the chest suggests mild congestive heart failure, as above. 3. Aortic atherosclerosis. Electronically Signed   By: Vinnie Langton M.D.   On: 04/03/2022 05:10  ? ?DG Chest Portable 1 View ? ?Result Date: 2022/04/03 ?CLINICAL DATA:  Intubation, OG tube placement EXAM: PORTABLE CHEST 1 VIEW COMPARISON:  03/20/2019 FINDINGS: 3 endotracheal tube is 2.3 cm above the carina. NG tube is in the stomach. Heart is mildly enlarged. Perihilar airspace opacities, likely edema. No effusions or pneumothorax. No acute bony abnormality. IMPRESSION: Mild cardiomegaly.  Perihilar airspace disease, likely edema. Electronically Signed   By: Rolm Baptise M.D.   On: 2022/04/03 02:31  ? ?DG Chest Port 1 View ? ?Result Date: 02/25/2022 ?CLINICAL DATA:  Questionable sepsis EXAM: PORTABLE CHEST 1 VIEW COMPARISON:  12/21/2019 FINDINGS: Heart borderline in size. Aortic atherosclerosis. Mild vascular congestion. No confluent opacities or effusions. No acute bony abnormality. IMPRESSION: Borderline heart size,  vascular congestion. Electronically Signed   By: Rolm Baptise M.D.   On: 03/16/2022 21:21  ? ?DG Humerus Left ? ?Result Date: 03/18/2022 ?CLINICAL DATA:  Left arm pain. EXAM: LEFT HUMERUS - 2+ VIEW COMPARISON:  Shoulder radiograph 12/09/2019, radiograph 09/17/2019 FINDINGS: The bones are diffusely under mineralized. There is no evidence of fracture or other focal bone lesions. Chronic shoulder arthropathy which is better assessed on prior dedicated shoulder exam. Soft tissues are unremarkable. IMPRESSION: No acute findings. Chronic shoulder osteoarthritis. Osteopenia/osteoporosis. Electronically Signed   By: Keith Rake M.D.   On: 03/16/2022 22:56  ? ?ECHOCARDIOGRAM COMPLETE ? ?Result Date: Apr 03, 2022 ?   ECHOCARDIOGRAM REPORT   Patient Name:   [name] Date of Exam: 04-03-22 Medical Rec #:  [id number]       Height:       63.0 in Accession #:    [id number]      Weight:       173.6 lb Date of Birth:  January 21, 1941       BSA:          1.821 m? Patient Age:    15 years        BP:           74/63 mmHg Patient Gender: F               HR:           109 bpm. Exam Location:  Inpatient Procedure: 2D Echo, Color Doppler, Cardiac Doppler and Intracardiac            Opacification Agent Indications:    [id number] Acute diastolic (congestive) heart failure  History:        Patient has prior history of Echocardiogram examinations, most                 recent 09/17/2019. CAD; Risk Factors:Hypertension, Diabetes and  Dyslipidemia.  Sonographer:    Raquel Sarna Senior RDCS Referring Phys: [id number] Collier Bullock  Sonographer Comments: Technically difficult due to patient body habitus, scanned supine on artificial respirator. IMPRESSIONS  1. Left ventricular ejection fraction, by estimation, is <20%. The left ventricle has severely decreased function. The left ventricle demonstrates global hypokinesis. Left ventricular diastolic parameters are indeterminate.  2. Right ventricular systolic function is moderately reduced. The  right ventricular size is normal. There is moderately elevated pulmonary artery systolic pressure.  3. The mitral valve is normal in structure. Mild mitral valve regurgitation. No evidence of mitral stenos

## 2022-03-25 NOTE — Plan of Care (Signed)

## 2022-03-25 NOTE — Progress Notes (Signed)
Pt transported from ED35 to 2M04 by RN, NT and RT w/o complications. ?

## 2022-03-25 NOTE — Code Documentation (Signed)
Dr. Wilkie Aye intubating pt.  ?

## 2022-03-25 NOTE — Progress Notes (Signed)
Lower extremity venous LT study completed. ? ?Preliminary results relayed to Shanda Bumps, RN. ? ?See CV Proc for preliminary results report.  ? ?Jean Rosenthal, RDMS, RVT ? ?

## 2022-03-25 NOTE — Progress Notes (Signed)
eLink Physician-Brief Progress Note ?Patient Name: [name] ?DOB: August 02, 1941 ?MRN: [id number] ? ? ?Date of Service ? 04/02/2022  ?HPI/Events of Note ? BP 75/62 on Norepinephrine gtt of 40 mcg, patient has received sepsis fluid boluses as well.  ?eICU Interventions ? Vasopressin gtt added.  ? ? ? ?  ? ?[name] ?2022-04-02, 5:22 AM ?

## 2022-03-25 NOTE — ED Provider Notes (Signed)
Signed out pending CT scan.  Improved presented altered and presumed septic shock.  Has some low blood pressures.  Also in atrial fibrillation with RVR.  Unknown source of infection.  X-ray clear.  Lactate initially 8.8.  She was given soft fluid resuscitation secondary to concern for pulmonary edema.  She did receive a total of 2000 cc of fluid.   ? ?On my initial evaluation she is somnolent but arousable to voice.  Does not really follow commands.  She is tachypneic and guppy breathing.  She is on 3 L of oxygen.  Per nursing, when she was transported to CT scanner and laid flat, she appeared to turn blue.  O2 sat was not picking up at that time.  They sat her back up and her color returned.   ? ?I have reviewed her chart extensively.  No obvious pneumonia or pneumothorax.  CT scan does not show any obvious infectious source.  She does have evidence of pulmonary edema and multiple other incidental findings.  She has evidence of shock liver with elevated LFTs.  She also has a leukocytosis.  She has a metabolic acidosis.  Likely some of her respiratory drive is related to compensation.  Tylenol level negative.  UDS and EtOH reassuring.  See full clinical course below.  Patient was ultimately intubated for airway control and respiratory drive. ? ?Physical Exam  ?BP (!) 160/102   Pulse 67   Temp 100.1 ?F (37.8 ?C)   Resp (!) 23   Ht 1.6 m (5\' 3" )   Wt 78.7 kg   SpO2 98%   BMI 30.75 kg/m?  ? ?Physical Exam ?Obese, somnolent, minimally arousable ?Tachypneic, guppy breathing, increased work of breathing ?Rales bilaterally ?1+ edema bilaterally ?Procedures  ?Marland KitchenCritical Care ?Performed by: Merryl Hacker, MD ?Authorized by: Merryl Hacker, MD  ? ?Critical care provider statement:  ?  Critical care time (minutes):  60 ?  Critical care was necessary to treat or prevent imminent or life-threatening deterioration of the following conditions:  Sepsis and respiratory failure ?  Critical care was time spent  personally by me on the following activities:  Development of treatment plan with patient or surrogate, discussions with consultants, evaluation of patient's response to treatment, examination of patient, ordering and review of laboratory studies, ordering and review of radiographic studies, ordering and performing treatments and interventions, pulse oximetry, re-evaluation of patient's condition and review of old charts ?Procedure Name: Intubation ?Date/Time: 04-18-2022 2:34 AM ?Performed by: Merryl Hacker, MD ?Pre-anesthesia Checklist: Patient identified, Patient being monitored, Emergency Drugs available, Timeout performed and Suction available ?Oxygen Delivery Method: Non-rebreather mask ?Preoxygenation: Pre-oxygenation with 100% oxygen ?Induction Type: Rapid sequence ?Ventilation: Mask ventilation without difficulty ?Laryngoscope Size: Glidescope and 3 ?Tube size: 7.5 mm ?Number of attempts: 1 ?Placement Confirmation: ETT inserted through vocal cords under direct vision, CO2 detector and Breath sounds checked- equal and bilateral ?Secured at: 23 cm ?Tube secured with: ETT holder ? ? ? ? ?ED Course / MDM  ? ?Clinical Course as of 04-18-22 0213  ?Wed 18-Apr-2022  ?0113 Patient's husband and stepson at the bedside.  She is significantly tachypneic.  With increased work of breathing.  Lactate is uptrending 10 9 with fluid resuscitation.  She is in A-fib with RVR.  There is no obvious infectious source although she is febrile to 100.1 and has received sepsis fluids.  I had a long discussion with the patient's family about intubation.  Feel that she is critically ill and I am  concerned that her respiratory drive will worsen.  She is not a candidate for BiPAP.  They are discussing whether they would like for her to be intubated.  Suspect her atrial fibrillation may be contributing to pulmonary edema in addition to worsening lactic acidosis. [CH]  ?0115 Patient's daughter is in route.  Lactate is trending upwards.   She has a metabolic acidosis on ABG.  Continues to have increased work of breathing and somnolence.  Not a BiPAP candidate.  Decision made to intubate.  Of note, she was given a small dose of Narcan given history of use of narcotics although none are listed on her MAR from the nursing home.  She had a brief increased level of consciousness but her respiratory status remained the same.  Confirmed that she is not on any narcotics at the nursing home.  She is on baclofen.  Patient was intubated without difficulty.  She was placed on a propofol drip.  She has Levophed and was given an additional 500 cc of fluid to finish her 30 cc/kg.  She remains in atrial fibrillation with a rate in the 130s.  We will discuss rate control with the ICU given pressors and sedation.   [CH]  ?  ?Clinical Course User Index ?[CH] Paislie Tessler, Barbette Hair, MD  ? ?Medical Decision Making ?Amount and/or Complexity of Data Reviewed ?Labs: ordered. ?Radiology: ordered. ? ?Risk ?OTC drugs. ?Prescription drug management. ?Decision regarding hospitalization. ? ? ?Problem List Items Addressed This Visit   ?None ?Visit Diagnoses   ? ? Shock ([place])    -  Primary  ? Glasgow coma scale total score 3-8, in the field (EMT or ambulance) [facility])      ? Acute on chronic respiratory failure with hypoxia (HCC)      ? Encephalopathy      ? Atrial fibrillation with RVR ([place])      ? Relevant Medications  ? norepinephrine (LEVOPHED) 4mg  in 287mL (0.016 mg/mL) premix infusion  ? metoprolol tartrate (LOPRESSOR) injection 5 mg (Completed)  ? metoprolol succinate (TOPROL-XL) 25 MG 24 hr tablet  ? furosemide (LASIX) 20 MG tablet  ? ?  ? ? ? ? ? ? ?  ?Merryl Hacker, MD ?04/19/2022 0236 ? ?

## 2022-03-25 NOTE — Progress Notes (Signed)
ANTICOAGULATION CONSULT NOTE - Initial Consult ? ?Pharmacy Consult for Heparin (Apixaban on hold) ?Indication: atrial fibrillation ? ?Allergies  ?Allergen Reactions  ? Azithromycin Nausea And Vomiting  ? Cardizem [Diltiazem Hcl] Nausea And Vomiting  ? Codeine Other (See Comments)  ?  Made the patient "feel weird" ?  ? Coreg [Carvedilol] Nausea And Vomiting  ? Demerol [Meperidine] Other (See Comments)  ?  Disorientation ?  ? Erythromycin Nausea And Vomiting and Other (See Comments)  ?  Made the patient "very sick"  ? Inderal [Propranolol] Other (See Comments)  ?  "Made me cry"  ? Procardia [Nifedipine] Other (See Comments)  ?  "Disoriented and sick"  ? Wellbutrin [Bupropion] Nausea Only  ? Zoloft [Sertraline Hcl] Other (See Comments)  ?  Could not talk, made the patient stiff  ? ? ?Patient Measurements: ?Height: 5\' 3"  (160 cm) ?Weight: 78.7 kg (173 lb 9.6 oz) ?IBW/kg (Calculated) : 52.4 ? ?Vital Signs: ?Temp: 99.8 ?F (37.7 ?C) (04/26 0250) ?Temp Source: Rectal (04/25 2055) ?BP: 103/74 (04/26 0415) ?Pulse Rate: 148 (04/26 0415) ? ?Labs: ?Recent Labs  ?  02/25/2022 ?2105 03/24/2022 ?2116 03/06/2022 ?2346 02/25/2022 ?2359 04/08/22 ?0245  ?HGB 10.9* 12.9  --  10.5* 11.9*  ?HCT 39.3 38.0  --  31.0* 35.0*  ?PLT 263  --   --   --   --   ?APTT 34  --   --   --   --   ?LABPROT 29.8*  --   --   --   --   ?INR 2.9*  --   --   --   --   ?CREATININE 1.13*  --   --   --   --   ?TROPONINIHS 54*  --  46*  --   --   ? ? ?Estimated Creatinine Clearance: 38.8 mL/min (A) (by C-G formula based on SCr of 1.13 mg/dL (H)). ? ? ?Medical History: ?Past Medical History:  ?Diagnosis Date  ? Abdominal discomfort   ? "due to medication intolerance"  ? CAD (coronary artery disease)   ? previous stent  ? Chronic pain syndrome 09/10/2013  ? Dr Nelva Bush,   ? [facility] Patient virus infection 1988  ? Excessive daytime sleepiness 12/05/2014  ? Patient has not been seen for a sleep study as ordered, and used adderall to keep alert in daytime.  She agreed  In a contract  not to have any scheduled medication for pain treatment from [facility] and will not receive refills for Adderall, which was initiated by dr Orland Penman, PCP>   ? Fall   ? Hyperlipemia   ? Hypersomnia, persistent 09/10/2013  ? Patient on  Stimulants .  ? Hypertension   ? Narcotic addiction ([place]) 12/05/2014  ? Obesity, unspecified 09/10/2013  ? Shoulder joint pain   ? both shoulders  ? Stroke [facility])   ? ? ?Assessment: ?81 y/o F presents to the ED with altered mental status and respiratory failure requiring intubation. On apixaban PTA for afib, holding apixaban and starting heparin. Last dose apixaban was 4/25 at 1430, ok to start heparin now. Anticipate using aPTT to dose for now.  ? ?Goal of Therapy:  ?Heparin level 0.3-0.7 units/ml ?aPTT 66-102 seconds ?Monitor platelets by anticoagulation protocol: Yes ?  ?Plan:  ?Start heparin drip at 900 units/hr ?Check heparin level and aPTT in 8 hours ?Daily CBC, heparin level, and aPTT ?Monitor for bleeding ? ?Narda Bonds ?Narda Bonds, PharmD, BCPS ?Clinical Pharmacist ?Phone: [phone] ? ? ? ?

## 2022-03-25 DEATH — deceased

## 2022-09-18 LAB — LEGIONELLA PNEUMOPHILA SEROGP 1 UR AG: L. pneumophila Serogp 1 Ur Ag: NEGATIVE
